# Patient Record
Sex: Male | Born: 1935 | Race: White | Hispanic: No | Marital: Married | Smoking: Former smoker
Health system: Southern US, Community
[De-identification: ages and names within clinical notes are randomized; demographics above are authoritative.]

## PROBLEM LIST (undated history)

## (undated) DIAGNOSIS — M1990 Unspecified osteoarthritis, unspecified site: Secondary | ICD-10-CM

## (undated) DIAGNOSIS — I10 Essential (primary) hypertension: Secondary | ICD-10-CM

## (undated) DIAGNOSIS — I499 Cardiac arrhythmia, unspecified: Secondary | ICD-10-CM

## (undated) DIAGNOSIS — R911 Solitary pulmonary nodule: Secondary | ICD-10-CM

## (undated) DIAGNOSIS — J189 Pneumonia, unspecified organism: Secondary | ICD-10-CM

## (undated) DIAGNOSIS — K4090 Unilateral inguinal hernia, without obstruction or gangrene, not specified as recurrent: Secondary | ICD-10-CM

## (undated) DIAGNOSIS — D649 Anemia, unspecified: Secondary | ICD-10-CM

## (undated) DIAGNOSIS — R0600 Dyspnea, unspecified: Secondary | ICD-10-CM

## (undated) DIAGNOSIS — J449 Chronic obstructive pulmonary disease, unspecified: Secondary | ICD-10-CM

## (undated) DIAGNOSIS — J439 Emphysema, unspecified: Secondary | ICD-10-CM

## (undated) DIAGNOSIS — G709 Myoneural disorder, unspecified: Secondary | ICD-10-CM

## (undated) DIAGNOSIS — G629 Polyneuropathy, unspecified: Secondary | ICD-10-CM

## (undated) DIAGNOSIS — J309 Allergic rhinitis, unspecified: Secondary | ICD-10-CM

## (undated) HISTORY — DX: Chronic obstructive pulmonary disease, unspecified: J44.9

## (undated) HISTORY — PX: TONSILLECTOMY: SUR1361

## (undated) HISTORY — PX: EYE SURGERY: SHX253

## (undated) HISTORY — DX: Solitary pulmonary nodule: R91.1

## (undated) HISTORY — PX: APPENDECTOMY: SHX54

## (undated) HISTORY — DX: Allergic rhinitis, unspecified: J30.9

## (undated) HISTORY — PX: VASECTOMY: SHX75

## (undated) HISTORY — PX: ROTATOR CUFF REPAIR: SHX139

## (undated) HISTORY — DX: Anemia, unspecified: D64.9

## (undated) HISTORY — PX: CATARACT EXTRACTION, BILATERAL: SHX1313

## (undated) HISTORY — DX: Emphysema, unspecified: J43.9

---
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
## 1999-08-05 ENCOUNTER — Encounter: Admission: RE | Admit: 1999-08-05 | Discharge: 1999-08-05 | Payer: Self-pay | Admitting: Orthopedic Surgery

## 1999-08-05 ENCOUNTER — Encounter: Payer: Self-pay | Admitting: Orthopedic Surgery

## 1999-08-05 ENCOUNTER — Ambulatory Visit (HOSPITAL_BASED_OUTPATIENT_CLINIC_OR_DEPARTMENT_OTHER): Admission: RE | Admit: 1999-08-05 | Discharge: 1999-08-05 | Payer: Self-pay | Admitting: Orthopedic Surgery

## 2000-02-26 ENCOUNTER — Encounter: Payer: Self-pay | Admitting: Internal Medicine

## 2000-02-26 ENCOUNTER — Ambulatory Visit (HOSPITAL_COMMUNITY): Admission: RE | Admit: 2000-02-26 | Discharge: 2000-02-26 | Payer: Self-pay | Admitting: *Deleted

## 2000-06-03 ENCOUNTER — Encounter: Payer: Self-pay | Admitting: Internal Medicine

## 2000-06-03 ENCOUNTER — Ambulatory Visit (HOSPITAL_COMMUNITY): Admission: RE | Admit: 2000-06-03 | Discharge: 2000-06-03 | Payer: Self-pay | Admitting: Internal Medicine

## 2000-08-16 HISTORY — PX: ROTATOR CUFF REPAIR: SHX139

## 2000-10-17 ENCOUNTER — Other Ambulatory Visit: Admission: RE | Admit: 2000-10-17 | Discharge: 2000-10-17 | Payer: Self-pay | Admitting: Urology

## 2000-10-17 ENCOUNTER — Encounter (INDEPENDENT_AMBULATORY_CARE_PROVIDER_SITE_OTHER): Payer: Self-pay

## 2001-08-16 HISTORY — PX: CARDIAC CATHETERIZATION: SHX172

## 2002-02-20 ENCOUNTER — Encounter: Payer: Self-pay | Admitting: Cardiology

## 2002-02-20 ENCOUNTER — Encounter: Payer: Self-pay | Admitting: Emergency Medicine

## 2002-02-20 ENCOUNTER — Inpatient Hospital Stay (HOSPITAL_COMMUNITY): Admission: EM | Admit: 2002-02-20 | Discharge: 2002-02-21 | Payer: Self-pay | Admitting: Emergency Medicine

## 2002-05-08 ENCOUNTER — Ambulatory Visit (HOSPITAL_COMMUNITY): Admission: RE | Admit: 2002-05-08 | Discharge: 2002-05-08 | Payer: Self-pay | Admitting: Cardiology

## 2002-05-08 ENCOUNTER — Encounter: Payer: Self-pay | Admitting: Cardiology

## 2002-09-12 ENCOUNTER — Ambulatory Visit (HOSPITAL_COMMUNITY): Admission: RE | Admit: 2002-09-12 | Discharge: 2002-09-12 | Payer: Self-pay | Admitting: Gastroenterology

## 2002-09-12 ENCOUNTER — Encounter (INDEPENDENT_AMBULATORY_CARE_PROVIDER_SITE_OTHER): Payer: Self-pay | Admitting: Specialist

## 2002-12-20 ENCOUNTER — Encounter: Payer: Self-pay | Admitting: Internal Medicine

## 2002-12-20 ENCOUNTER — Ambulatory Visit (HOSPITAL_COMMUNITY): Admission: RE | Admit: 2002-12-20 | Discharge: 2002-12-20 | Payer: Self-pay | Admitting: Internal Medicine

## 2003-04-18 ENCOUNTER — Ambulatory Visit (HOSPITAL_COMMUNITY): Admission: RE | Admit: 2003-04-18 | Discharge: 2003-04-18 | Payer: Self-pay | Admitting: Internal Medicine

## 2004-08-19 ENCOUNTER — Ambulatory Visit: Payer: Self-pay | Admitting: Internal Medicine

## 2004-10-01 ENCOUNTER — Ambulatory Visit: Payer: Self-pay | Admitting: Internal Medicine

## 2004-10-08 ENCOUNTER — Ambulatory Visit: Payer: Self-pay | Admitting: Internal Medicine

## 2005-01-07 ENCOUNTER — Ambulatory Visit: Payer: Self-pay | Admitting: Internal Medicine

## 2005-07-01 ENCOUNTER — Ambulatory Visit: Payer: Self-pay | Admitting: Internal Medicine

## 2005-07-01 LAB — PULMONARY FUNCTION TEST

## 2005-09-27 ENCOUNTER — Ambulatory Visit: Payer: Self-pay | Admitting: Internal Medicine

## 2006-01-06 ENCOUNTER — Ambulatory Visit: Payer: Self-pay | Admitting: Internal Medicine

## 2006-01-07 ENCOUNTER — Ambulatory Visit: Payer: Self-pay | Admitting: Cardiology

## 2006-01-14 ENCOUNTER — Encounter (HOSPITAL_COMMUNITY): Admission: RE | Admit: 2006-01-14 | Discharge: 2006-04-14 | Payer: Self-pay | Admitting: Internal Medicine

## 2006-02-18 ENCOUNTER — Ambulatory Visit: Payer: Self-pay | Admitting: Internal Medicine

## 2006-04-19 ENCOUNTER — Encounter (HOSPITAL_COMMUNITY): Admission: RE | Admit: 2006-04-19 | Discharge: 2006-07-18 | Payer: Self-pay | Admitting: Internal Medicine

## 2006-07-19 ENCOUNTER — Encounter (HOSPITAL_COMMUNITY): Admission: RE | Admit: 2006-07-19 | Discharge: 2006-10-17 | Payer: Self-pay | Admitting: Internal Medicine

## 2006-08-16 HISTORY — PX: CATARACT EXTRACTION: SUR2

## 2006-08-18 ENCOUNTER — Ambulatory Visit: Payer: Self-pay | Admitting: Internal Medicine

## 2006-10-18 ENCOUNTER — Encounter (HOSPITAL_COMMUNITY): Admission: RE | Admit: 2006-10-18 | Discharge: 2006-11-15 | Payer: Self-pay | Admitting: Internal Medicine

## 2007-02-24 ENCOUNTER — Ambulatory Visit: Payer: Self-pay | Admitting: Internal Medicine

## 2007-07-21 ENCOUNTER — Telehealth: Payer: Self-pay | Admitting: Internal Medicine

## 2007-07-24 DIAGNOSIS — M7150 Other bursitis, not elsewhere classified, unspecified site: Secondary | ICD-10-CM | POA: Insufficient documentation

## 2007-07-24 DIAGNOSIS — J449 Chronic obstructive pulmonary disease, unspecified: Secondary | ICD-10-CM | POA: Insufficient documentation

## 2007-07-24 DIAGNOSIS — J309 Allergic rhinitis, unspecified: Secondary | ICD-10-CM | POA: Insufficient documentation

## 2007-07-25 ENCOUNTER — Encounter (HOSPITAL_COMMUNITY): Admission: RE | Admit: 2007-07-25 | Discharge: 2007-08-15 | Payer: Self-pay | Admitting: Internal Medicine

## 2007-08-17 ENCOUNTER — Encounter (HOSPITAL_COMMUNITY): Admission: RE | Admit: 2007-08-17 | Discharge: 2007-09-15 | Payer: Self-pay | Admitting: Internal Medicine

## 2007-08-23 ENCOUNTER — Ambulatory Visit: Payer: Self-pay | Admitting: Internal Medicine

## 2008-02-21 ENCOUNTER — Ambulatory Visit: Payer: Self-pay | Admitting: Internal Medicine

## 2008-02-29 ENCOUNTER — Encounter: Payer: Self-pay | Admitting: Internal Medicine

## 2008-03-08 ENCOUNTER — Telehealth: Payer: Self-pay | Admitting: Internal Medicine

## 2008-08-21 ENCOUNTER — Ambulatory Visit: Payer: Self-pay | Admitting: Internal Medicine

## 2008-08-28 ENCOUNTER — Encounter: Payer: Self-pay | Admitting: Internal Medicine

## 2008-09-10 ENCOUNTER — Telehealth: Payer: Self-pay | Admitting: Internal Medicine

## 2008-09-16 DIAGNOSIS — R911 Solitary pulmonary nodule: Secondary | ICD-10-CM | POA: Insufficient documentation

## 2008-12-12 ENCOUNTER — Ambulatory Visit: Payer: Self-pay | Admitting: Internal Medicine

## 2009-03-05 ENCOUNTER — Ambulatory Visit: Payer: Self-pay | Admitting: Internal Medicine

## 2009-03-06 ENCOUNTER — Telehealth: Payer: Self-pay | Admitting: Internal Medicine

## 2009-05-06 ENCOUNTER — Telehealth (INDEPENDENT_AMBULATORY_CARE_PROVIDER_SITE_OTHER): Payer: Self-pay | Admitting: *Deleted

## 2009-07-08 ENCOUNTER — Ambulatory Visit: Payer: Self-pay | Admitting: Internal Medicine

## 2009-09-02 ENCOUNTER — Encounter: Payer: Self-pay | Admitting: Internal Medicine

## 2009-10-13 ENCOUNTER — Telehealth: Payer: Self-pay | Admitting: Internal Medicine

## 2009-10-14 ENCOUNTER — Ambulatory Visit: Payer: Self-pay | Admitting: Internal Medicine

## 2009-10-15 ENCOUNTER — Telehealth: Payer: Self-pay | Admitting: Internal Medicine

## 2009-10-26 ENCOUNTER — Encounter: Payer: Self-pay | Admitting: Internal Medicine

## 2009-11-11 ENCOUNTER — Ambulatory Visit: Payer: Self-pay | Admitting: Internal Medicine

## 2009-11-15 ENCOUNTER — Encounter: Payer: Self-pay | Admitting: Internal Medicine

## 2009-11-26 ENCOUNTER — Telehealth (INDEPENDENT_AMBULATORY_CARE_PROVIDER_SITE_OTHER): Payer: Self-pay | Admitting: *Deleted

## 2010-05-20 ENCOUNTER — Ambulatory Visit: Payer: Self-pay | Admitting: Internal Medicine

## 2010-08-25 ENCOUNTER — Telehealth (INDEPENDENT_AMBULATORY_CARE_PROVIDER_SITE_OTHER): Payer: Self-pay | Admitting: *Deleted

## 2010-09-05 ENCOUNTER — Other Ambulatory Visit: Payer: Self-pay | Admitting: Family Medicine

## 2010-09-05 DIAGNOSIS — Z136 Encounter for screening for cardiovascular disorders: Secondary | ICD-10-CM

## 2010-09-15 NOTE — Progress Notes (Signed)
Summary: rx instructions  Phone Note Call from Patient Call back at [phone]   Caller: Patient Call For: young Reason for Call: Talk to Nurse, Talk to Doctor Summary of Call: breathing treatment did help.  on instruction sheet symbicort is different from prescription  please clarify. Initial call taken by: Eugene Gavia,  October 15, 2009 9:47 AM  Follow-up for Phone Call        Pt states he is using symbicort 2 puffs twice daily, but this is incorrect on med list. I have corrected med list. Pt also states that the breahting treatment helped his breathing and states that CY mentioned if it helped that he would give order for home neb treatments. Please advise if you want pt to begin home neb treatments. Thanks. Carron Curie CMA  October 15, 2009 11:00 AM allergies: singulair, tolectin  Additional Follow-up for Phone Call Additional follow up Details #1::        I have asked [facility] to set up home neb. Additional Follow-up by: Waymon Budge MD,  October 15, 2009 2:46 PM    Additional Follow-up for Phone Call Additional follow up Details #2::    Please provide me with a written rx for nebulizer meds. Rhonda Cobb  October 15, 2009 2:50 PM Order for home nebulizer and meds faxed to [facility]. Pt is aware. Rhonda Cobb  October 15, 2009 3:08 PM   New/Updated Medications: NEBULIZER COMPRESSOR  MISC (NEBULIZERS) As directed ALBUTEROL SULFATE (2.5 MG/3ML) 0.083% NEBU (ALBUTEROL SULFATE) 1 neb four times a day as needed Prescriptions: ALBUTEROL SULFATE (2.5 MG/3ML) 0.083% NEBU (ALBUTEROL SULFATE) 1 neb four times a day as needed  #50 x prn   Entered by:   Waymon Budge MD   Authorized by:   Pulmonary Triage   Signed by:   Waymon Budge MD on 10/15/2009   Method used:   Historical   RxID:   [id number] NEBULIZER COMPRESSOR  MISC (NEBULIZERS) As directed  #1 x 0   Entered by:   Waymon Budge MD   Authorized by:   Pulmonary Triage   Signed by:   Waymon Budge MD on 10/15/2009  Method used:   Historical   RxID:   [id number]   Appended Document: rx instructions-need to print rx's    Clinical Lists Changes  Medications: Rx of NEBULIZER COMPRESSOR  MISC (NEBULIZERS) As directed;  #1 x 0;  Signed;  Entered by: Reynaldo Minium CMA;  Authorized by: Waymon Budge MD;  Method used: Print then Give to Patient Rx of ALBUTEROL SULFATE (2.5 MG/3ML) 0.083% NEBU (ALBUTEROL SULFATE) 1 neb four times a day as needed;  #50 x prn;  Signed;  Entered by: Reynaldo Minium CMA;  Authorized by: Waymon Budge MD;  Method used: Print then Give to Patient    Prescriptions: ALBUTEROL SULFATE (2.5 MG/3ML) 0.083% NEBU (ALBUTEROL SULFATE) 1 neb four times a day as needed  #50 x prn   Entered by:   Reynaldo Minium CMA   Authorized by:   Waymon Budge MD   Signed by:   Reynaldo Minium CMA on 10/15/2009   Method used:   Print then Give to Patient   RxID:   [id number] NEBULIZER COMPRESSOR  MISC (NEBULIZERS) As directed  #1 x 0   Entered by:   Reynaldo Minium CMA   Authorized by:   Waymon Budge MD   Signed by:   Reynaldo Minium CMA on 10/15/2009  Method used:   Print then Give to Patient   RxID:   [id number]

## 2010-09-15 NOTE — Progress Notes (Signed)
Summary: prescription  Phone Note Call from Patient Call back at [phone]   Caller: Patient Call For: young Summary of Call: pt calling to see why prednisone prescript was denied Initial call taken by: Rickard Patience,  May 06, 2009 2:07 PM  Follow-up for Phone Call        PER CY OK TO GIVE PREDNISONE 10MG  #20--1 four times a day x 2, --1 three times a day x2--1 twice a day x2--1 once daily x 2  pt aware rx sent to pharmacy Follow-up by: Philipp Deputy CMA,  May 06, 2009 2:40 PM    New/Updated Medications: PREDNISONE 10 MG TABS (PREDNISONE) 1 four times a day x 2 days, 1 three times a day x 2 days, 1 twice a day x 2 days, 1 once daily x 2 days Prescriptions: PREDNISONE 10 MG TABS (PREDNISONE) 1 four times a day x 2 days, 1 three times a day x 2 days, 1 twice a day x 2 days, 1 once daily x 2 days  #20 x 0   Entered by:   Philipp Deputy CMA   Authorized by:   Waymon Budge MD   Signed by:   Philipp Deputy CMA on 05/06/2009   Method used:   Electronically to        [facility]. [address] [phone]* (retail)       [address]       [place], [place]  [address]       Ph: [phone] or [phone]       Fax: [phone]   RxID:   [id number]

## 2010-09-15 NOTE — Assessment & Plan Note (Signed)
Summary: 6 months/apc   Visit Type:  Follow-up Primary Provider/Referring Provider:  Wyline Beady  CC:  6 month f/u; wants CXR.  History of Present Illness:  30/60- 76 year old man returning for follow-up with severe COPD complicating history of allergic rhinitis and coronary disease. previously questioned lung nodule.  A chest x-ray in January showed only stable COPD.  He notices easy exertional dyspnea, especially climbing stairs.  He leaves his oxygen at the foot of the stair and sets it on 4 L while he climbs or with any other significant activity.  He feels comfortable without oxygen walking level around the house.  For the past two weeks he has noted  medications hang at the level of his larynx.  He denies pain or choking when he swallows, chest pain, cough, or phlegm, reflux, or GI or GU upset.  08/21/08- COPD, Hx pulm nodule Feels much better- less tired and more energy- since began oxygen for sleep- asks to check. Little cough/ phlegm. Occasionally small streak of blood, not progressive. denies chest pain. Finished pulmonary rehab. Comfortable ambulation for errands etc on room air, unless he is carrying something. Got both flu vax.Rare need for rescue inhaler.   12/12/08- COPD, Hx pulm nodule 3 weeks increased cough, sneeze, rhinorhea, reduced energy. Denies significant sore throat or fever or purulent discharge. Cough is more productive. Yesterday he started a prednisone taper with question about directions which we resolved.  03/05/09- COPD, Hx pulm nodule Back to his baseline after prednisone for the exacerbation last visit. He is careful about the hot weather.  he does note his cough is more poroductive of thick phlegm- clear or trace yellow. Not wheezing, but frequently short of breath- stairs especially and uses his oxygen for starirs.     Allergies: 1)  ! * Singulair 2)  ! Tolectin  Past History:  Past Medical History: Last updated: 08/26/2007 C O P D Allergic  Rhinitis cataract surgery R rotator cuff tonsilectomy  appendectomy  Past Surgical History: Last updated: 08/21/2008 Tonsils Appendectomy Cataracts Right rotator cuff vasectomy  Family History: Last updated: 08-26-07 Father died CVA Mother- old age, COPD Sister died  COPD- smoked Coronary Heart Disease  Social History: Last updated: 08/26/2007 Patient states former smoker- quit 1992  Risk Factors: Smoking Status: quit (26-Aug-2007)  Review of Systems      See HPI       The patient complains of hoarseness and prolonged cough.  The patient denies anorexia, fever, weight loss, weight gain, vision loss, decreased hearing, chest pain, syncope, dyspnea on exertion, peripheral edema, headaches, hemoptysis, abdominal pain, melena, hematochezia, severe indigestion/heartburn, hematuria, incontinence, genital sores, muscle weakness, suspicious skin lesions, transient blindness, difficulty walking, depression, unusual weight change, abnormal bleeding, enlarged lymph nodes, angioedema, breast masses, and testicular masses.    Vital Signs:  Patient profile:   76 year old male Height:      70 inches Weight:      203.13 pounds BMI:     29.25 O2 Sat:      90 % on Room air Pulse rate:   72 / minute BP sitting:   136 / 74  (left arm) Cuff size:   regular  Vitals Entered By: Marinus Maw (March 05, 2009 9:55 AM)  O2 Flow:  Room air  O2 Sat Comments O2 sat at 90% on RA - rechecked after rest and sat of 95% Marinus Maw  March 05, 2009 10:01 AM  CC: 6 month f/u; wants CXR Comments Medications reviewed with patient  Marinus Maw  March 05, 2009 9:57 AM    Physical Exam  Additional Exam:  General: A/Ox3; pleasant and cooperative, NAD, SKIN: no rash, lesions NODES: no lymphadenopathy HEENT: [place]/AT, EOM- WNL, Conjuctivae- clear, PERRLA, TM-WNL, Nose- clear, Throat- clear and wnl, Melampatti II NECK: Supple w/ fair ROM, JVD- none, normal carotid impulses w/o  bruits Thyroid-  CHEST: Dry faint crackles left base- noted again  withut cough, unlabored, no dullness. HEART: RRR, no m/g/r heard ABDOMEN- Soft, nontender ZOX:WRUE, nl pulses, no edema  NEURO: Grossly intact to observation      Impression & Recommendations:  Problem # 1:  C O P D (ICD-496)  Increased mucus viscosity without infection- mostly from drying. He complains of dry mouth over night. We discussed Biotine.Spiriva is drying. he will discuss protable concentrator for travel oxygen- with [facility]. Orders: T-2 View CXR (71020TC) Est. Patient Level III (45409)  Problem # 2:  PULMONARY NODULE (ICD-518.89)  Will repeat CXR  Medications Added to Medication List This Visit: 1)  Aspirin 81 Mg Tabs (Aspirin) .... Take one tablet daiy.  Patient Instructions: 1)  Please schedule a follow-up appointment in 4 months. 2)  A chest x-ray has been recommended.  Your imaging study may require preauthorization.  3)  Talk with [facility] for suggestions about contacting other home care companies to see what travel oxygen options they have. 4)  BP today- 136/74, room air saturation 95% at rest

## 2010-09-15 NOTE — Progress Notes (Signed)
Summary: oximetry results add to also use for sleep  Phone Note Call from Patient   Caller: Patient Summary of Call: phone [phone] pt called wanting to know results of oximetry. This was done by [facility] and report sent to Dr. Maple Hudson on 03/04/08. Please notify pt of results. Initial call taken by: Alfonso Ramus,  March 08, 2008 10:39 AM  Follow-up for Phone Call        Awaiting ONO to be refaxed from [facility]. We will place results with this message for CY to review.  Pt aware. Michel Bickers [facility]  March 08, 2008 10:54 AM Follow-up by: Waymon Budge MD,  March 08, 2008 11:44 AM    New/Updated Medications: * OXYGEN 2 Liters/min with exercise, sleep

## 2010-09-15 NOTE — Progress Notes (Signed)
Summary: speak to nurse  Phone Note Call from Patient Call back at Home Phone [phone]   Caller: Patient Call For: young Reason for Call: Talk to Nurse Summary of Call: want to speak to nurse re: Symbicort she called in to [facility]. Initial call taken by: Eugene Gavia,  November 26, 2009 3:43 PM  Follow-up for Phone Call        called spoke with patient. he asked me to verify the quantity and directions from last OV, which were #3 and 1 puff two times a day.  per 06/2009 ov note, pt is to take this 2 puffs two times a day.  med changed on med list and sent into mail-order pharmacy. Follow-up by: Boone Master CNA,  November 26, 2009 4:31 PM    New/Updated Medications: SYMBICORT 160-4.5 MCG/ACT AERO (BUDESONIDE-FORMOTEROL FUMARATE) 2 puffs two times a day and RINSE mouth well Prescriptions: SYMBICORT 160-4.5 MCG/ACT AERO (BUDESONIDE-FORMOTEROL FUMARATE) 2 puffs two times a day and RINSE mouth well  #4 x 3   Entered by:   Boone Master CNA   Authorized by:   Waymon Budge MD   Signed by:   Boone Master CNA on 11/26/2009   Method used:   Electronically to        [facility]* (mail-order)       [address], [place]  [address]       Ph: [phone]       Fax: [phone]   RxID:   [id number]

## 2010-09-15 NOTE — Progress Notes (Signed)
Summary: sob - want appt  Phone Note Call from Patient Call back at [phone]   Caller: Patient Call For: young Reason for Call: Talk to Nurse Summary of Call: want an appt, having trouble breathing. Initial call taken by: Eugene Gavia,  October 13, 2009 4:08 PM  Follow-up for Phone Call        pt states CY has changed around some of his meds and he is still having SOB.  Pt request appt. Per KW ok to add to 11:15 slot tomorrow. Pt aware.Carron Curie CMA  October 13, 2009 4:22 PM

## 2010-09-15 NOTE — Assessment & Plan Note (Signed)
Summary: rov 6 months////kp   PCP:  Wyline Beady  Chief Complaint:  6 month follow up visit .  History of Present Illness: Current Problems:  CORONARY HEART DISEASE (ICD-V17.3) Hx of PULMONARY NODULE (ICD-518.89) ALLERGIC RHINITIS (ICD-477.9) BURSITIS (ICD-727.3) C O P D (ICD-13)  52/100- 76 year old man returning for follow-up with severe COPD complicating history of allergic rhinitis and coronary disease. previously questioned lung nodule.  A chest x-ray in January showed only stable COPD.  He notices easy exertional dyspnea, especially climbing stairs.  He leaves his oxygen at the foot of the stair and sets it on 4 L while he climbs or with any other significant activity.  He feels comfortable without oxygen walking level around the house.  For the past two weeks he has noted  medications hang at the level of his larynx.  He denies pain or choking when he swallows, chest pain, cough, or phlegm, reflux, or GI or GU upset.  08/21/08- COPD, Hx pulm nodule Feels much better- less tired and more energy- since began oxygen for sleep- asks to check. Little cough/ phlegm. Occasionally small streak of blood, not progressive. denies chest pain. Finished pulmonary rehab. Comfortable ambulation for errands etc on room air, unless he is carrying something. Got both flu vax.Rare need for rescue inhaler.        Prior Medications Reviewed Using: Patient Recall  Updated Prior Medication List: SPIRIVA HANDIHALER 18 MCG  CAPS (TIOTROPIUM BROMIDE MONOHYDRATE) Inhale contents of 1 capsule once a day ADVAIR DISKUS 250-50 MCG/DOSE  MISC (FLUTICASONE-SALMETEROL) inhale one puff two times a day LANOXIN 0.25 MG  TABS (DIGOXIN) take one tab by mouth once daily NORVASC 5 MG  TABS (AMLODIPINE BESYLATE) take one tab by mouth once daily ZOCOR 40 MG  TABS (SIMVASTATIN) take one tab by mouth at bedtime DIOVAN 80 MG  TABS (VALSARTAN) take one tab by mouth once daily * OXYGEN 2 Liters/min with exercise,  sleep ALBUTEROL 90 MCG/ACT  AERS (ALBUTEROL) use as needed FINASTERIDE 5 MG TABS (FINASTERIDE) take 1 every other day  Current Allergies (reviewed today): ! * SINGULAIR ! TOLECTIN  Past Medical History:    Reviewed history from 08/23/2007 and no changes required:       C O P D       Allergic Rhinitis       cataract surgery       R rotator cuff       tonsilectomy        appendectomy  Past Surgical History:    Reviewed history and no changes required:       Tonsils       Appendectomy       Cataracts       Right rotator cuff       vasectomy   Social History:    Reviewed history from 08/23/2007 and no changes required:       Patient states former smoker- quit 1992    Review of Systems      See HPI       Denies headache, sinus drainage, sneezing chest pain, n/v/d, weight loss, fever, edema.     Vital Signs:  Patient Profile:   76 Years Old Male Weight:      204 pounds O2 Sat:      96 % O2 treatment:    Room Air Pulse rate:   78 / minute BP sitting:   110 / 64  (left arm) Cuff size:   regular  Vitals Entered By: Florentina Addison  Welchel CMA (August 21, 2008 10:32 AM)             Comments Medications reviewed with patient Reynaldo Minium CMA  August 21, 2008 10:33 AM      Physical Exam  General: A/Ox3; pleasant and cooperative, NAD, SKIN: no rash, lesions NODES: no lymphadenopathy HEENT: [place]/AT, EOM- WNL, Conjuctivae- clear, PERRLA, TM-WNL, Nose- clear, Throat- clear and wnl NECK: Supple w/ fair ROM, JVD- none, normal carotid impulses w/o bruits Thyroid- normal to palpation CHEST: Dry faint crackles left base, unlabored, no dullness. HEART: RRR, no m/g/r heard ABDOMEN: Soft and nl; nml bowel sounds; no organomegaly or masses noted OHY:WVPX, nl pulses, no edema  NEURO: Grossly intact to observation      CXR  Procedure date:  08/21/2008  Findings:       CHEST - 2 VIEW   Comparison: 08/23/2007.   Findings: There is a questionable medially located left  lower lobe pulmonary nodule.  This would be more accurately evaluated by CT scan.  The 10 mm size nodule previously seen within the left lower lobe on a CT chest dated 01/07/2006 would be better followed by follow-up chest CT scan (if felt to be indicated).  There are changes of COPD present which are stable.  There are no infiltrates.  The heart is normal in size and there is no evidence for mediastinal or hilar adenopathy.  The central pulmonary arteries are prominent but unchanged.   IMPRESSION:   1.  Possible left lower lobe nodule.  This would be better evaluated by chest CT scan.  (Please see above discussion). 2. Changes of COPD - stable.   Read By:  Stoney Bang,  M.D.     Released By:  Stoney Bang,  M.D.      Problem # 1:  C O P D (ICD-496) Stable. Not sure if oxygen lets him sleep more soundly, relieves some pulmonary hypertension, or both, but as long as it makes him feel better to sleep with it we can assume it is helpful. Not sure significance of occasional, nonprogressive spot of heme in clear mucus Plan:CXR.ONOXimetry His updated medication list for this problem includes:    Spiriva Handihaler 18 Mcg Caps (Tiotropium bromide monohydrate) ..... Inhale contents of 1 capsule once a day    Advair Diskus 250-50 Mcg/dose Misc (Fluticasone-salmeterol) ..... Inhale one puff two times a day    Albuterol 90 Mcg/act Aers (Albuterol) ..... Use as needed   Problem # 2:  Hx of PULMONARY NODULE (ICD-518.89) Current cxr again shows an indistinct left lower lobe nodule. This is probably the same nodule seen on CT in 2007. Will discuss with patient whether to repeat CT.  Medications Added to Medication List This Visit: 1)  Finasteride 5 Mg Tabs (Finasteride) .... Take 1 every other day   Patient Instructions: 1)  Please schedule a follow-up appointment in 6 months. 2)  Samples Advair 250/50, Spiriva. 3)  A chest x-ray has been recommended.  Your imaging study  may require preauthorization.  4)  See [facility] about overnight oximetry 5)  Copy note to Catha Gosselin   ]

## 2010-09-15 NOTE — Assessment & Plan Note (Signed)
Summary: increased SOB//jrc   Primary Provider/Referring Provider:  Wyline Beady  CC:  Accute Visit-Increased SOB still; was sick approx 6 weeks ago. [name]  History of Present Illness: 03/05/09- COPD, Hx pulm nodule Back to his baseline after prednisone for the exacerbation last visit. He is careful about the hot weather.  he does note his cough is more poroductive of thick phlegm- clear or trace yellow. Not wheezing, but frequently short of breath- stairs especially and uses his oxygen for starirs.  July 08, 2009- COPD, Hx pulm nodule He thinks over past year slowly more exertional dyspnea and more apt to need oxygen when he is active. He asks about more use of his rescue inhaler. We discussed his Advair and alternatives. Had poor experience with theophylline. Watery rhinorhea for an hour on waking- probably from nasal oxygen drying. Uses Oxygen 2.5 at night, 4 l for steps. We reviewed his last cxr again in  detail and discussed criteria for CT. Had Flu shot   October 14, 2009- COPD, Hx pulm nodule. Just after last here, he started the Symbicort for comparison. Then got a respiratory infection treated with antibiotic and prednisone. He has never felt that his breathing got back to baseline. Easy dyspnea walking across room Sputum might be a little yellow. Denies chest pain, wheeze, ankle edema, palpitations. Symbicort not clearly better than Advair. Failed previous theophyline trial. Oxygen does help. Room air O2 sat today is 90%.  Current Medications (verified): 1)  Spiriva Handihaler 18 Mcg  Caps (Tiotropium Bromide Monohydrate) .... Inhale Contents of 1 Capsule Once A Day 2)  Proventil Hfa 108 (90 Base) Mcg/act Aers (Albuterol Sulfate) .... 2 Puffs Four Times A Day As Needed 3)  Oxygen 2.-3 L/m For Sleep, Exercise 4)  Lanoxin 0.25 Mg  Tabs (Digoxin) .... Take One Tab By Mouth Once Daily 5)  Norvasc 5 Mg  Tabs (Amlodipine Besylate) .... Take One Tab By Mouth Once Daily 6)  Zocor 40 Mg  Tabs  (Simvastatin) .... Take One Tab By Mouth At Bedtime 7)  Diovan 80 Mg  Tabs (Valsartan) .... Take One Tab By Mouth Once Daily 8)  Finasteride 5 Mg Tabs (Finasteride) .... Take 1 Every Other Day 9)  Aspirin 81 Mg Tabs (Aspirin) .... Take One Tablet Daiy. 10)  Multivitamins  Tabs (Multiple Vitamin) .... Take 1 By Mouth Once Daily 11)  Symbicort 160-4.5 Mcg/act Aero (Budesonide-Formoterol Fumarate) .[name].. 1 Puff Two Times A Day and Rinse Mouth Well  Allergies (verified): 1)  ! * Singulair 2)  ! Tolectin  Past History:  Past Medical History: Last updated: September 21, 2007 C O P D Allergic Rhinitis cataract surgery R rotator cuff tonsilectomy  appendectomy  Past Surgical History: Last updated: 08/21/2008 Tonsils Appendectomy Cataracts Right rotator cuff vasectomy  Family History: Last updated: 09-21-2007 Father died CVA Mother- old age, COPD Sister died  COPD- smoked Coronary Heart Disease  Social History: Last updated: September 21, 2007 Patient states former smoker- quit 1992  Risk Factors: Smoking Status: quit (2007/09/21)  Review of Systems      See HPI       The patient complains of dyspnea on exertion.  The patient denies anorexia, fever, weight loss, weight gain, vision loss, decreased hearing, hoarseness, chest pain, syncope, peripheral edema, prolonged cough, headaches, hemoptysis, abdominal pain, and severe indigestion/heartburn.    Vital Signs:  Patient profile:   76 year old male Height:      70 inches Weight:      202.13 pounds BMI:  29.11 O2 Sat:      90 % on Room air Pulse rate:   92 / minute BP sitting:   132 / 80  (left arm) Cuff size:   regular  Vitals Entered By: Reynaldo Minium CMA (October 14, 2009 11:32 AM)  O2 Flow:  Room air  Physical Exam  Additional Exam:  General: A/Ox3; pleasant and cooperative, NAD, SKIN: no rash, lesions NODES: no lymphadenopathy HEENT: [place]/AT, EOM- WNL, Conjuctivae- clear, PERRLA, TM-WNL, Nose- clear, Throat- clear and wnl,  Melampatti II NECK: Supple w/ fair ROM, JVD- none, normal carotid impulses w/o bruits Thyroid-  CHEST: Dry faint crackles left base- noted again  without cough, unlabored, no dullness.Very distant, slow expiratory phase HEART: RRR, no m/g/r heard ABDOMEN- Soft, nontender IEP:PIRJ, nl pulses, no edema  NEURO: Grossly intact to observation       Impression & Recommendations:  Problem # 1:  PULMONARY NODULE (ICD-518.89) Not reported on recent CXRs  Problem # 2:  C O P D (ICD-496) We discussed ways to change his bronchodilator status, recognizing he has mostly emphysema. We will give a neb treatment today and low dose theophylline. Consider home nebulizer. Use oxygen more of the time. I can't tell that his cardiac status has changed.  Medications Added to Medication List This Visit: 1)  Symbicort 160-4.5 Mcg/act Aero (Budesonide-formoterol fumarate) .[name].. 1 puff two times a day and rinse mouth well 2)  Theophylline Cr 100 Mg Xr12h-tab (Theophylline) .[name].. 1 twice daily after meals  Other Orders: Est. Patient Level III (18841) Xopenex 1.25mg  (Y6063)  Patient Instructions: 1)  Please schedule a follow-up appointment in 1 month. 2)  neb xop 1.25 3)  try theophylline for a couple of weeks.  4)  If either of these seems to make any difference we can extend it. Prescriptions: THEOPHYLLINE CR 100 MG XR12H-TAB (THEOPHYLLINE) 1 twice daily after meals  #30 x 0   Entered and Authorized by:   Waymon Budge MD   Signed by:   Waymon Budge MD on 10/14/2009   Method used:   Print then Give to Patient   RxID:   [id number]      Medication Administration  Medication # 1:    Medication: Xopenex 1.25mg     Diagnosis: C O P D (ICD-496)    Dose: 1.25mg     Route: inhaled    Exp Date: 09-11    Lot #: [id number]    Mfr: SEPRACOR    Patient tolerated medication without complications    Given by: Elray Buba RN (October 14, 2009 12:16 PM)  Orders Added: 1)  Est. Patient Level III  [62376] 2)  Xopenex 1.25mg  [E8315]

## 2010-09-15 NOTE — Assessment & Plan Note (Signed)
Summary: 1 month/apc   Primary Provider/Referring Provider:  Wyline Beady  CC:  1 month follow up visit.  History of Present Illness: July 08, 2009- COPD, Hx pulm nodule He thinks over past year slowly more exertional dyspnea and more apt to need oxygen when he is active. He asks about more use of his rescue inhaler. We discussed his Advair and alternatives. Had poor experience with theophylline. Watery rhinorhea for an hour on waking- probably from nasal oxygen drying. Uses Oxygen 2.5 at night, 4 l for steps. We reviewed his last cxr again in  detail and discussed criteria for CT. Had Flu shot   October 14, 2009- COPD, Hx pulm nodule. Just after last here, he started the Symbicort for comparison. Then got a respiratory infection treated with antibiotic and prednisone. He has never felt that his breathing got back to baseline. Easy dyspnea walking across room Sputum might be a little yellow. Denies chest pain, wheeze, ankle edema, palpitations. Symbicort not clearly better than Advair. Failed previous theophyline trial. Oxygen does help. Room air O2 sat today is 90%.  November 11, 2009- COPD, hx pulm nodule He couldn't tell benefit after theophylline after 5 days trial. Albuterol by nebulizer caused rapid heartbeat, light headed with no benefit to his breathing. He did better with the rescue albuterol inhaler. We talked about the purpose and side effects of available bronchodilators.  Current Medications (verified): 1)  Spiriva Handihaler 18 Mcg  Caps (Tiotropium Bromide Monohydrate) .... Inhale Contents of 1 Capsule Once A Day 2)  Proventil Hfa 108 (90 Base) Mcg/act Aers (Albuterol Sulfate) .... 2 Puffs Four Times A Day As Needed 3)  Oxygen 2.-3 L/m For Sleep, Exercise 4)  Lanoxin 0.25 Mg  Tabs (Digoxin) .... Take One Tab By Mouth Once Daily 5)  Norvasc 5 Mg  Tabs (Amlodipine Besylate) .... Take One Tab By Mouth Once Daily 6)  Zocor 40 Mg  Tabs (Simvastatin) .... Take One Tab By Mouth At  Bedtime 7)  Diovan 80 Mg  Tabs (Valsartan) .... Take One Tab By Mouth Once Daily 8)  Finasteride 5 Mg Tabs (Finasteride) .... Take 1 Every Other Day 9)  Aspirin 81 Mg Tabs (Aspirin) .... Take One Tablet Daiy. 10)  Multivitamins  Tabs (Multiple Vitamin) .... Take 1 By Mouth Once Daily 11)  Symbicort 160-4.5 Mcg/act Aero (Budesonide-Formoterol Fumarate) .[name].. 1 Puff Two Times A Day and Rinse Mouth Well 12)  Theophylline Cr 100 Mg Xr12h-Tab (Theophylline) .[name].. 1 Twice Daily After Meals 13)  Nebulizer Compressor  Misc (Nebulizers) .... As Directed 14)  Albuterol Sulfate (2.5 Mg/71ml) 0.083% Nebu (Albuterol Sulfate) .[name].. 1 Neb Four Times A Day As Needed  Allergies (verified): 1)  ! * Singulair 2)  ! Tolectin  Past History:  Past Medical History: Last updated: 09/04/07 C O P D Allergic Rhinitis cataract surgery R rotator cuff tonsilectomy  appendectomy  Past Surgical History: Last updated: 08/21/2008 Tonsils Appendectomy Cataracts Right rotator cuff vasectomy  Family History: Last updated: 09-04-2007 Father died CVA Mother- old age, COPD Sister died  COPD- smoked Coronary Heart Disease  Social History: Last updated: 09/04/07 Patient states former smoker- quit 1992  Risk Factors: Smoking Status: quit (09/04/07)  Review of Systems      See HPI       The patient complains of dyspnea on exertion.  The patient denies anorexia, fever, weight loss, weight gain, vision loss, decreased hearing, hoarseness, chest pain, syncope, peripheral edema, prolonged cough, headaches, hemoptysis, abdominal pain, and severe indigestion/heartburn.  Vital Signs:  Patient profile:   76 year old male Height:      70 inches Weight:      200.25 pounds BMI:     28.84 O2 Sat:      93 % on Room air Pulse rate:   82 / minute BP sitting:   124 / 64  (left arm) Cuff size:   regular  Vitals Entered By: Reynaldo Minium CMA (November 11, 2009 2:15 PM)  O2 Flow:  Room air  Physical  Exam  Additional Exam:  General: A/Ox3; pleasant and cooperative, NAD, Room air sat 93% at rest SKIN: no rash, lesions NODES: no lymphadenopathy HEENT: [place]/AT, EOM- WNL, Conjuctivae- clear, PERRLA, TM-WNL, Nose- clear, Throat- clear and wnl, [place]ampatti II NECK: Supple w/ fair ROM, JVD- none, normal carotid impulses w/o bruits Thyroid-  CHEST:Clear to P&A, very distant, unlabored. HEART: RRR, no m/g/r heard ABDOMEN- Soft, nontender JXB:JYNW, nl pulses, no edema  NEURO: Grossly intact to observation       Impression & Recommendations:  Problem # 1:  C O P D (ICD-496) Overall stable with slow progression of diease manifest by gradual increase in DOE. We compared Symbicort to Advair. I emphasized need to walk for endurance. He will save albuterol nebulizer for use when really having a hard time. Consider Xopenex trial.  Problem # 2:  ALLERGIC RHINITIS (ICD-477.9) Not having much trouble so far with the early tree pollens.  Other Orders: Est. Patient Level II (29562)  Patient Instructions: 1)  Please schedule a follow-up appointment in 6 months. 2)  Let me know if you have problems earlier. Keep walking! 3)  If you wanted to experiment more with the theophylline, try increasing to 3 tabs daily for a week, then perhaps up to a max of 4/day (200 mg twice daily). Prescriptions: SYMBICORT 160-4.5 MCG/ACT AERO (BUDESONIDE-FORMOTEROL FUMARATE) 1 puff two times a day and RINSE mouth well  #3 x 3   Entered and Authorized by:   Waymon Budge MD   Signed by:   Waymon Budge MD on 11/11/2009   Method used:   Print then Give to Patient   RxID:   [id number] THEOPHYLLINE CR 100 MG XR12H-TAB (THEOPHYLLINE) 1 twice daily after meals  #30 x prn   Entered and Authorized by:   Waymon Budge MD   Signed by:   Waymon Budge MD on 11/11/2009   Method used:   Print then Give to Patient   RxID:   [id number]

## 2010-09-15 NOTE — Assessment & Plan Note (Signed)
Summary: rov 6 months ///kp   Primary Provider/Referring Provider:  Wyline Beady  CC:  6 month follow up visit-allergies and COPD; SOB with activity still and no allergy flare ups..  History of Present Illness:  October 14, 2009- COPD, Hx pulm nodule. Just after last here, he started the Symbicort for comparison. Then got a respiratory infection treated with antibiotic and prednisone. He has never felt that his breathing got back to baseline. Easy dyspnea walking across room Sputum might be a little yellow. Denies chest pain, wheeze, ankle edema, palpitations. Symbicort not clearly better than Advair. Failed previous theophyline trial. Oxygen does help. Room air O2 sat today is 90%.  November 11, 2009- COPD, hx pulm nodule He couldn't tell benefit after theophylline after 5 days trial. Albuterol by nebulizer caused rapid heartbeat, light headed with no benefit to his breathing. He did better with the rescue albuterol inhaler. We talked about the purpose and side effects of available bronchodilators.  May 20, 2010- COPD, Hx pulm nodule 6 month follow up visit-allergies and COPD; SOB with activity still and no allergy flare ups. He finds as weather cools his stamina is reduced a little. He is using oxygen more. He isn't sure that Symbicort has made a difference. We discussed the 3 comparable products and discussed cost, incidence of pneumonia. He asks if it is time to update CT for lung nodule We went back to review reports from the last several CXRs and his CT of 2007. He understands that this left lower lobe nodule has not grown enough to be visible consistently on CXR in that time, and is probably benign.    Preventive Screening-Counseling & Management  Alcohol-Tobacco     Smoking Status: quit     Year Quit: 1992     Pack years: 35 years 2 packs daily  Current Medications (verified): 1)  Spiriva Handihaler 18 Mcg  Caps (Tiotropium Bromide Monohydrate) .... Inhale Contents of 1 Capsule Once  A Day 2)  Proventil Hfa 108 (90 Base) Mcg/act Aers (Albuterol Sulfate) .... 2 Puffs Four Times A Day As Needed 3)  Oxygen 2.-3 L/m For Sleep, Exercise 4)  Lanoxin 0.25 Mg  Tabs (Digoxin) .... Take One Tab By Mouth Once Daily 5)  Norvasc 5 Mg  Tabs (Amlodipine Besylate) .... Take One Tab By Mouth Once Daily 6)  Zocor 40 Mg  Tabs (Simvastatin) .... Take One Tab By Mouth At Bedtime 7)  Diovan 80 Mg  Tabs (Valsartan) .... Take One Tab By Mouth Once Daily 8)  Finasteride 5 Mg Tabs (Finasteride) .... Take 1 Every Other Day 9)  Aspirin 81 Mg Tabs (Aspirin) .... Take One Tablet Daiy. 10)  Multivitamins  Tabs (Multiple Vitamin) .... Take 1 By Mouth Once Daily 11)  Symbicort 160-4.5 Mcg/act Aero (Budesonide-Formoterol Fumarate) .... 2 Puffs Two Times A Day and Rinse Mouth Well 12)  Nebulizer Compressor  Misc (Nebulizers) .... As Directed 13)  Albuterol Sulfate (2.5 Mg/65ml) 0.083% Nebu (Albuterol Sulfate) .[name].. 1 Neb Four Times A Day As Needed  Allergies (verified): 1)  ! * Singulair 2)  ! Tolectin  Past History:  Past Medical History: Last updated: September 16, 2007 C O P D Allergic Rhinitis cataract surgery R rotator cuff tonsilectomy  appendectomy  Past Surgical History: Last updated: 08/21/2008 Tonsils Appendectomy Cataracts Right rotator cuff vasectomy  Family History: Last updated: 09-16-07 Father died CVA Mother- old age, COPD Sister died  COPD- smoked Coronary Heart Disease  Social History: Last updated: 09-16-07 Patient states former  smoker- quit 1992  Risk Factors: Smoking Status: quit (05/20/2010)  Review of Systems      See HPI       The patient complains of shortness of breath with activity.  The patient denies shortness of breath at rest, productive cough, non-productive cough, coughing up blood, chest pain, irregular heartbeats, acid heartburn, indigestion, loss of appetite, weight change, abdominal pain, difficulty swallowing, sore throat, tooth/dental problems,  headaches, nasal congestion/difficulty breathing through nose, sneezing, itching, hand/feet swelling, rash, change in color of mucus, and fever.    Vital Signs:  Patient profile:   76 year old male Height:      70 inches Weight:      197.38 pounds BMI:     28.42 O2 Sat:      92 % on Room air Pulse rate:   87 / minute BP sitting:   120 / 70  (left arm) Cuff size:   regular  Vitals Entered By: Reynaldo Minium CMA (May 20, 2010 9:52 AM)  O2 Flow:  Room air CC: 6 month follow up visit-allergies and COPD; SOB with activity still and no allergy flare ups.   Physical Exam  Additional Exam:  General: A/Ox3; pleasant and cooperative, NAD, Room air sat 92% at rest SKIN: no rash, lesions NODES: no lymphadenopathy HEENT: [place]/AT, EOM- WNL, Conjuctivae- clear, PERRLA, TM-WNL, Nose- clear, Throat- clear and wnl, Mallampati II NECK: Supple w/ fair ROM, JVD- none, normal carotid impulses w/o bruits Thyroid-  CHEST:Clear to P&A, very distant, unlabored. Slight cough HEART: RRR, no m/g/r heard ABDOMEN- Soft, nontender ZOX:WRUE, nl pulses, no edema  NEURO: Grossly intact to observation       Impression & Recommendations:  Problem # 1:  PULMONARY NODULE (ICD-518.89)  Not clearly progressive. We will not do CT now, but will do CXR for long term. long interval f/u.   Problem # 2:  C O P D (ICD-496) We will go back to Advair, which is at least likely to be a bit cheaper. I expect slow gradual progression of disease and encourage use of the steroid inhaler which has been shwn to slow progression. Also he will continue some degree of regular aerobic exercise to maintain stamina.   Problem # 3:  CORONARY HEART DISEASE (ICD-V17.3) We have discussed potential cardiac status changes that may present as increased dyspnea on exertion, palpitations, angina or fluid retention. He will report if noted.   Medications Added to Medication List This Visit: 1)  Advair Diskus 250-50 Mcg/dose Aepb  (Fluticasone-salmeterol) .[name].. 1 puff and rinse, twice daily  Other Orders: Est. Patient Level IV (45409) T-2 View CXR (71020TC)  Patient Instructions: 1)  Please schedule a follow-up appointment in 1 year. 2)  Script to refill Advair 250 3)  cc Dr Catha Gosselin Prescriptions: ADVAIR DISKUS 250-50 MCG/DOSE AEPB (FLUTICASONE-SALMETEROL) 1 puff and rinse, twice daily  #3 x 3   Entered and Authorized by:   Waymon Budge MD   Signed by:   Waymon Budge MD on 05/20/2010   Method used:   Print then Give to Patient   RxID:   [id number]

## 2010-09-15 NOTE — Progress Notes (Signed)
Summary: medication  Phone Note Call from Patient Call back at [phone]   Caller: Patient Call For: young Summary of Call: need the name of over the counter med for dry mouth  Initial call taken by: Rickard Patience,  March 06, 2009 1:22 PM  Follow-up for Phone Call        Spoke with pt; gave the name of OTC med for dry mouth(biotene). Reynaldo Minium CMA  March 06, 2009 1:31 PM

## 2010-09-15 NOTE — Procedures (Signed)
Summary: Oximetry Report/[facility]  Oximetry Report/[facility]   Imported By: Esmeralda Links D'jimraou 03/13/2008 12:31:36  _____________________________________________________________________  External Attachment:    Type:   Image     Comment:   External Document

## 2010-09-15 NOTE — Progress Notes (Signed)
Summary: speak to nurse  Phone Note Call from Patient Call back at [phone]   Caller: Patient Call For: young Reason for Call: Talk to Nurse Summary of Call: need further info from nurse re: chest xray Initial call taken by: [name],  September 10, 2008 10:05 AM  Follow-up for Phone Call        [email or web address] [name]  September 10, 2008 10:08 AM   RETURNING NURSE CALL  Additional Follow-up for Phone Call Additional follow up Details #1::        Spoke with pt.  He questions whether or not all of his previous scans and cxrs has been compared to his most recent cxr.  He states that he is concerned about all the radiation he is getting from all the ct scans.  He would like to speak with CDY please call pt at [phone].   Additional Follow-up by: [name],  September 10, 2008 2:30 PM  New Problems: PULMONARY NODULE (ICD-518.89)   Additional Follow-up for Phone Call Additional follow up Details #2::    I called patient and discussed the nodule and radiology report. He realizes, while this is PROBABLY the same nodule present x years, I can not guarantee it. He accepts that this might not be the same nodule, but is comfortable waiting for f/u cxr at next scheduled visit. Follow-up by: [name] Budge MD,  September 16, 2008 11:41 AM  New Problems: PULMONARY NODULE (ICD-518.89)

## 2010-09-15 NOTE — Progress Notes (Signed)
Summary: ORDER FOR PULM REHAB  Phone Note Call from Patient   Caller: Patient Call For: YOUNG Summary of Call: NEEDS ORDER FOR PULMONARY REHAB FAXED TO: [phone]. [place]. ATTN: [name]. CHART READS TO YOUNG NURSE AREA 11/28. PT SAYS HE WENT TO [facility] YESTERDAY BUT HE NEEDS THIS ORDER FIRST.  PATIENT'S CHART HAS BEEN REQUESTED Initial call taken by: Tivis Ringer,  July 21, 2007 10:20 AM  Follow-up for Phone Call        CDY you have this order and chart OK-CDY Follow-up by: Reynaldo Minium,  July 21, 2007 4:38 PM

## 2010-09-15 NOTE — Assessment & Plan Note (Signed)
Summary: 6 months/apc   Visit Type:  Follow-up PCP:  Wyline Beady  Chief Complaint:  68month visit...[name] KitchenMarland KitchenMarland Kitchenreviewed meds.....  History of Present Illness: Current Problems:  CORONARY HEART DISEASE (ICD-V17.3) Hx of PULMONARY NODULE (ICD-518.89) ALLERGIC RHINITIS (ICD-477.9) BURSITIS (ICD-727.3) C O P D (ICD-100)  76 year old man returning for follow-up with severe COPD complicating history of allergic rhinitis and coronary disease. previously questioned lung nodule.  A chest x-ray in January showed only stable COPD.  He notices easy exertional dyspnea, especially climbing stairs.  He leaves his oxygen at the foot of the stair and sets it on 4 L while he climbs or with any other significant activity.  He feels comfortable without oxygen walking level around the house.  For the past two weeks he has noted  medications hang at the level of his larynx.  He denies pain or choking when he swallows, chest pain, cough, or phlegm, reflux, or GI or GU upset.       Current Allergies: ! * SINGULAIR ! TOLECTIN  Past Medical History:    Reviewed history from 08/23/2007 and no changes required:       C O P D       Allergic Rhinitis       cataract surgery       R rotator cuff       tonsilectomy        appendectomy     Review of Systems      See HPI   Vital Signs:  Patient Profile:   76 Years Old Male Weight:      200 pounds O2 Sat:      92 % O2 treatment:    Room Air Pulse rate:   88 / minute BP sitting:   100 / 66  (left arm) Cuff size:   regular  Vitals Entered By: Clarise Cruz Duncan Dull) (February 21, 2008 9:09 AM)                 Physical Exam  GENERAL:  A/Ox3; pleasant & cooperative.NAD HEENT:  [place]/AT, EOM-wnl, PERRLA, EACs-clear, TMs-wnl, NOSE-clear, THROAT-clear & wnl. NECK:  Supple w/ fair ROM; no JVD; normal carotid impulses w/o bruits; no thyromegaly or nodules palpated; no lymphadenopathy. CHEST:Drecreased but clear HEART:  RRR, no m/r/g  heard ABDOMEN:  Soft & nt;  nml bowel sounds; no organomegaly or masses detected. EXT: Warm bilat,  no calf pain, edema, clubbing, pulses intact Skin: no rash/lesion        Problem # 1:  C O P D (ICD-496) he is more aware of exertional dyspnea requiring oxygen.  We are going to do a full oximetry check to reassess oxygen needs.  He has done pulmonary rehabilitation and still tries to walk. rediscussed his sense of pill hangup at the larynx.  Voice quality has not changed.  If this persists, we will ask swallowing evaluation and ENT for visual examination as appropriate. His updated medication list for this problem includes:    Spiriva Handihaler 18 Mcg Caps (Tiotropium bromide monohydrate) ..... Inhale contents of 1 capsule once a day    Advair Diskus 250-50 Mcg/dose Misc (Fluticasone-salmeterol) ..... Inhale one puff two times a day    Albuterol 90 Mcg/act Aers (Albuterol) ..... Use as needed   Problem # 2:  ALLERGIC RHINITIS (ICD-477.9) he is not complaining of rhinitis symptoms currently.   Patient Instructions: 1)  Please schedule a follow-up appointment in 6 months. 2)  We will set up a check of your oxygen  saturation levels   ]

## 2010-09-15 NOTE — Procedures (Signed)
Summary: oximetry report/[facility]  oximetry report/[facility]   Imported By: Lester Murray 03/13/2008 08:42:25  _____________________________________________________________________  External Attachment:    Type:   Image     Comment:   External Document

## 2010-09-15 NOTE — Procedures (Signed)
Summary: Oximetry Report/[facility]  Oximetry Report/[facility]   Imported By: Esmeralda Links D'jimraou 09/05/2008 14:56:01  _____________________________________________________________________  External Attachment:    Type:   Image     Comment:   External Document  Appended Document: Oximetry Report/[facility] Note significant desaturation despite oxygen at 2 L/M during sleep. Suggest he increase night-time oxygen to 2.5 L/M.  Appended Document: Oximetry Report/[facility] Spoke with pt.  Advised of oximetry results.  Pt will incr night time oxygen to 2 1/2L.  EWJ

## 2010-09-15 NOTE — Assessment & Plan Note (Signed)
Summary: breathing problem/ mbw   Primary Provider/Referring Provider:  Wyline Beady  CC:  Accute Visit-Increased SOB, cough-productive-thick, clear to light yellow. Fatigued. Denies fever, chills, and N&V.[name]  History of Present Illness: Current Problems:  CORONARY HEART DISEASE (ICD-V17.3) PULMONARY NODULE (ICD-518.89) ALLERGIC RHINITIS (ICD-477.9) BURSITIS (ICD-727.3) C O P D (ICD-34)  33/53- 76 year old man returning for follow-up with severe COPD complicating history of allergic rhinitis and coronary disease. previously questioned lung nodule.  A chest x-ray in January showed only stable COPD.  He notices easy exertional dyspnea, especially climbing stairs.  He leaves his oxygen at the foot of the stair and sets it on 4 L while he climbs or with any other significant activity.  He feels comfortable without oxygen walking level around the house.  For the past two weeks he has noted  medications hang at the level of his larynx.  He denies pain or choking when he swallows, chest pain, cough, or phlegm, reflux, or GI or GU upset.  08/21/08- COPD, Hx pulm nodule Feels much better- less tired and more energy- since began oxygen for sleep- asks to check. Little cough/ phlegm. Occasionally small streak of blood, not progressive. denies chest pain. Finished pulmonary rehab. Comfortable ambulation for errands etc on room air, unless he is carrying something. Got both flu vax.Rare need for rescue inhaler.   12/12/08- COPD, Hx pulm nodule 3 weeks increased cough, sneeze, rhinorhea, reduced energy. Denies significant sore throat or fever or purulent discharge. Cough is more productive. yesterday he started a prednisone taper with question about directions which we resolved.          Current Medications (verified): 1)  Spiriva Handihaler 18 Mcg  Caps (Tiotropium Bromide Monohydrate) .... Inhale Contents of 1 Capsule Once A Day 2)  Advair Diskus 250-50 Mcg/dose  Misc (Fluticasone-Salmeterol) ....  Inhale One Puff Two Times A Day 3)  Albuterol 90 Mcg/act  Aers (Albuterol) .... Use As Needed 4)  Oxygen 2.-3 L/m For Sleep, Exercise 5)  Lanoxin 0.25 Mg  Tabs (Digoxin) .... Take One Tab By Mouth Once Daily 6)  Norvasc 5 Mg  Tabs (Amlodipine Besylate) .... Take One Tab By Mouth Once Daily 7)  Zocor 40 Mg  Tabs (Simvastatin) .... Take One Tab By Mouth At Bedtime 8)  Diovan 80 Mg  Tabs (Valsartan) .... Take One Tab By Mouth Once Daily 9)  Finasteride 5 Mg Tabs (Finasteride) .... Take 1 Every Other Day 10)  Prednisone 5 Mg Tabs (Prednisone) .... Take 4 X 2days, 3 X 2days, 2 X 2days, 1 X 2days  Allergies (verified): 1)  ! * Singulair 2)  ! Tolectin  Past History:  Family History:    Father died CVA    Mother- old age, COPD    Sister died  COPD- smoked    Coronary Heart Disease     (08/23/2007)  Social History:    Patient states former smoker- quit 1992     (08/23/2007)  Risk Factors:    Alcohol Use: N/A    >5 drinks/d w/in last 3 months: N/A    Caffeine Use: N/A    Diet: N/A    Exercise: N/A  Risk Factors:    Smoking Status: quit (08/23/2007)    Packs/Day: N/A    Cigars/wk: N/A    Pipe Use/wk: N/A    Cans of tobacco/wk: N/A    Passive Smoke Exposure: N/A  Past medical, surgical, family and social histories (including risk factors) reviewed for relevance to current acute and chronic  problems.  Past Medical History:    Reviewed history from 08/23/2007 and no changes required:    C O P D    Allergic Rhinitis    cataract surgery    R rotator cuff    tonsilectomy     appendectomy  Past Surgical History:    Reviewed history from 08/21/2008 and no changes required:    Tonsils    Appendectomy    Cataracts    Right rotator cuff    vasectomy  Family History:    Reviewed history from 08/23/2007 and no changes required:       Father died CVA       Mother- old age, COPD       Sister died  COPD- smoked       Coronary Heart Disease  Social History:    Reviewed  history from 08/23/2007 and no changes required:       Patient states former smoker- quit 1992  Review of Systems      See HPI       The patient complains of dyspnea on exertion and peripheral edema.  The patient denies anorexia, fever, weight loss, weight gain, vision loss, decreased hearing, hoarseness, chest pain, syncope, prolonged cough, hemoptysis, abdominal pain, and severe indigestion/heartburn.    Vital Signs:  Patient profile:   76 year old male Height:      70 inches Weight:      203 pounds BMI:     29.23 Pulse rate:   71 / minute BP sitting:   118 / 62  (left arm) Cuff size:   regular  Vitals Entered By: Reynaldo Minium CMA (December 12, 2008 4:15 PM)  O2 Sat on room air at rest %:  95 CC: Accute Visit-Increased SOB, cough-productive-thick, clear to light yellow. Fatigued. Denies fever, chills, N&V. Comments Medications reviewed with patient Reynaldo Minium CMA  December 12, 2008 4:15 PM    Physical Exam  Additional Exam:  General: A/Ox3; pleasant and cooperative, NAD, SKIN: no rash, lesions NODES: no lymphadenopathy HEENT: [place]/AT, EOM- WNL, Conjuctivae- clear, PERRLA, TM-WNL, Nose- clear, Throat- clear and wnl, Melampatti II NECK: Supple w/ fair ROM, JVD- none, normal carotid impulses w/o bruits Thyroid-  CHEST: Dry faint crackles left base- louder today  withut cough, unlabored, no dullness. HEART: RRR, no m/g/r heard ABDOME JWJ:XBJY, nl pulses, no edema  NEURO: Grossly intact to observation      Impression & Recommendations:  Problem # 1:  C O P D (ICD-496) Allergic vs viral exacer bation. He has started pred taper so will progress with that as discussed.  Problem # 2:  PULMONARY NODULE (ICD-518.89) We will  recheck cxr on return scheduled in July  Medications Added to Medication List This Visit: 1)  Prednisone 10 Mg Tabs (Prednisone) .[name].. 1 tab four times daily x 2 days, 3 times daily x 2 days, 2 times daily x 2 days, 1 time daily x 2 days  Other  Orders: Est. Patient Level II (78295)  Patient Instructions: 1)  Keep appt as scheduled 2)  Suggested prednisone taper: 3)  40 mg x 2 days 4)  30 mg x 2 days 5)  20 mg x 2 days 6)  10 mg x 2 days 7)  Replacement script to hold, using 10 mg tabs, was sent to your drug store Prescriptions: PREDNISONE 10 MG TABS (PREDNISONE) 1 tab four times daily x 2 days, 3 times daily x 2 days, 2 times daily x 2 days, 1 time  daily x 2 days  #20 x 0   Entered and Authorized by:   Waymon Budge MD   Signed by:   Waymon Budge MD on 12/12/2008   Method used:   Electronically to        [facility]. [address] [phone]* (retail)       [address]       [place], [place]  [address]       Ph: [phone] or [phone]       Fax: [phone]   RxID:   [id number]

## 2010-09-15 NOTE — Assessment & Plan Note (Signed)
Summary: 4 MONTHS/ MBW   Primary Provider/Referring Provider:  Wyline Beady  CC:  4 month follow up visit-"not as good with breathing"; Increased SOB and discuss rescue inhaler..  History of Present Illness: 08/21/08- COPD, Hx pulm nodule Feels much better- less tired and more energy- since began oxygen for sleep- asks to check. Little cough/ phlegm. Occasionally small streak of blood, not progressive. denies chest pain. Finished pulmonary rehab. Comfortable ambulation for errands etc on room air, unless he is carrying something. Got both flu vax.Rare need for rescue inhaler.   12/12/08- COPD, Hx pulm nodule 3 weeks increased cough, sneeze, rhinorhea, reduced energy. Denies significant sore throat or fever or purulent discharge. Cough is more productive. Yesterday he started a prednisone taper with question about directions which we resolved.  03/05/09- COPD, Hx pulm nodule Back to his baseline after prednisone for the exacerbation last visit. He is careful about the hot weather.  he does note his cough is more poroductive of thick phlegm- clear or trace yellow. Not wheezing, but frequently short of breath- stairs especially and uses his oxygen for starirs.  July 08, 2009- COPD, Hx pulm nodule He thinks over past year slowly more exertional dyspnea and more apt to need oxygen when he is active. He asks about more use of his rescue inhaler. We discussed his Advair and alternatives. Had poor experience with theophylline. Watery rhinorhea for an hour on waking- probably from nasal oxygen drying. Uses Oxygen 2.5 at night, 4 l for steps. We reviewed his last cxr again in  detail and discussed criteria for CT. Had Flu shot   Current Medications (verified): 1)  Spiriva Handihaler 18 Mcg  Caps (Tiotropium Bromide Monohydrate) .... Inhale Contents of 1 Capsule Once A Day 2)  Advair Diskus 250-50 Mcg/dose  Misc (Fluticasone-Salmeterol) .... Inhale One Puff Two Times A Day 3)  Proventil Hfa 108 (90  Base) Mcg/act Aers (Albuterol Sulfate) .... 2 Puffs Four Times A Day As Needed 4)  Oxygen 2.-3 L/m For Sleep, Exercise 5)  Lanoxin 0.25 Mg  Tabs (Digoxin) .... Take One Tab By Mouth Once Daily 6)  Norvasc 5 Mg  Tabs (Amlodipine Besylate) .... Take One Tab By Mouth Once Daily 7)  Zocor 40 Mg  Tabs (Simvastatin) .... Take One Tab By Mouth At Bedtime 8)  Diovan 80 Mg  Tabs (Valsartan) .... Take One Tab By Mouth Once Daily 9)  Finasteride 5 Mg Tabs (Finasteride) .... Take 1 Every Other Day 10)  Aspirin 81 Mg Tabs (Aspirin) .... Take One Tablet Daiy. 11)  Multivitamins  Tabs (Multiple Vitamin) .... Take 1 By Mouth Once Daily  Allergies (verified): 1)  ! * Singulair 2)  ! Tolectin  Past History:  Past Medical History: Last updated: 01-Sep-2007 C O P D Allergic Rhinitis cataract surgery R rotator cuff tonsilectomy  appendectomy  Past Surgical History: Last updated: 08/21/2008 Tonsils Appendectomy Cataracts Right rotator cuff vasectomy  Family History: Last updated: 09-01-2007 Father died CVA Mother- old age, COPD Sister died  COPD- smoked Coronary Heart Disease  Social History: Last updated: 09-01-07 Patient states former smoker- quit 1992  Risk Factors: Smoking Status: quit (09-01-2007)  Review of Systems      See HPI       The patient complains of dyspnea on exertion.  The patient denies anorexia, fever, weight loss, weight gain, vision loss, decreased hearing, hoarseness, chest pain, syncope, peripheral edema, prolonged cough, headaches, hemoptysis, abdominal pain, and severe indigestion/heartburn.    Vital Signs:  Patient profile:  76 year old male Height:      70 inches Weight:      205 pounds BMI:     29.52 O2 Sat:      90 % on Room air Pulse rate:   100 / minute BP sitting:   134 / 70  (left arm) Cuff size:   regular  Vitals Entered By: Reynaldo Minium CMA (July 08, 2009 9:19 AM)  O2 Flow:  Room air  Physical Exam  Additional Exam:  General:  A/Ox3; pleasant and cooperative, NAD, SKIN: no rash, lesions NODES: no lymphadenopathy HEENT: [place]/AT, EOM- WNL, Conjuctivae- clear, PERRLA, TM-WNL, Nose- clear, Throat- clear and wnl, Melampatti II NECK: Supple w/ fair ROM, JVD- none, normal carotid impulses w/o bruits Thyroid-  CHEST: Dry faint crackles left base- noted again  without cough, unlabored, no dullness.Very distant, slow expiratory phase HEART: RRR, no m/g/r heard ABDOMEN- Soft, nontender ZOX:WRUE, nl pulses, no edema  NEURO: Grossly intact to observation       Impression & Recommendations:  Problem # 1:  C O P D (ICD-496) Severe COPD with slowly progressing emphysema. He can use his rescue inhaler a bit more if helpful, and run oxygen at 3-4.  Problem # 2:  PULMONARY NODULE (ICD-518.89)  This has not been evident on recent CXR and is not likely to be active. We will get a CXR at next visit, keeping option for CT open.  Medications Added to Medication List This Visit: 1)  Proventil Hfa 108 (90 Base) Mcg/act Aers (Albuterol sulfate) .... 2 puffs four times a day as needed 2)  Multivitamins Tabs (Multiple vitamin) .... Take 1 by mouth once daily  Other Orders: Est. Patient Level III (45409)  Patient Instructions: 1)  Please schedule a follow-up appointment in 6 months. 2)  Consider trying otc nasal saline gel for dry nose/ watery in AMs 3)  Walking is the best exercise to maintain some stamina. 4)  Symbicort 160, 2 puffs two times a day as alternative to Advair.

## 2010-09-15 NOTE — Assessment & Plan Note (Signed)
Summary: ROV-6 MONTHS/KWP   Visit Type:  Follow-up PCP:  Wyline Beady  Chief Complaint:  check up.  History of Present Illness: COPD Lung nodule   Trying to stay well for a cruise. Feels well now. Got flu shot. Stable mild thickness of phlegm. Cough sometimes trace brown.  DOE stairs and fast walking without change Needed pred burst on trip    Current Allergies: ! * SINGULAIR ! TOLECTIN  Past Medical History:    Reviewed history from 07/24/2007 and no changes required:       C O P D       Allergic Rhinitis       cataract surgery       R rotator cuff       tonsilectomy        appendectomy   Family History:    Reviewed history and no changes required:       Father died CVA       Mother- old age, COPD       Sister died  COPD- smoked       Coronary Heart Disease  Social History:    Patient states former smoker- quit 1992   Risk Factors:  Tobacco use:  quit    Year quit:  1992    Pack-years:  35 years 2 packs daily   Review of Systems       The patient complains of dyspnea on exhertion.  The patient denies fever, weight loss, hoarseness, peripheral edema, prolonged cough, hemoptysis, unusual weight change, and enlarged lymph nodes.     Vital Signs:  Patient Profile:   76 Years Old Male Weight:      208.13 pounds O2 Sat:      92 % Pulse rate:   91 / minute BP sitting:   130 / 72  (left arm) Cuff size:   regular  Vitals Entered By: Darra Lis RMA (August 23, 2007 9:29 AM) Oxygen therapy Room Air             Is Patient Diabetic? No Comments meds reviewed ..................................................................[name] KitchenDarra Lis RMA  August 23, 2007 9:31 AM      Physical Exam  General:     well developed, well nourished, in no acute distress Head:     normocephalic and atraumatic Eyes:     PERRLA/EOM intact; conjunctiva and sclera clear Nose:     no deformity, discharge, inflammation, or lesions Mouth:     no deformity or  lesionsMelampatti Class III.   Neck:     no masses, thyromegaly, or abnormal cervical nodes Chest Wall:     no deformities noted Lungs:     decreased BS bilateral.   Coarse breath sounds left base Heart:     regular rate and rhythm, S1, S2 without murmurs, rubs, gallops, or clicks     Problem # 1:  C O P D (ICD-496) Stable currently. Asked standby pred taper- given with discussion. His updated medication list for this problem includes:    Spiriva Handihaler 18 Mcg Caps (Tiotropium bromide monohydrate) ..... Inhale contents of 1 capsule once a day    Advair Diskus 250-50 Mcg/dose Misc (Fluticasone-salmeterol) ..... Inhale one puff two times a day    Albuterol 90 Mcg/act Aers (Albuterol) ..... Use as needed    Prednisone 5 Mg Tabs (Prednisone) .[name]... 2, 4x daily x 2 days 2, 3x daily x 2 days 2, 2 x daily x 2 days 2, 1 x daily x 2 days  Orders: Est. Patient Level III (46962) T-2 View CXR, Same Day (71020.5TC)   Problem # 2:  Hx of PULMONARY NODULE (ICD-518.89) Due for f/u cxr Orders: Est. Patient Level III (95284) T-2 View CXR, Same Day (71020.5TC)   Medications Added to Medication List This Visit: 1)  Prednisone 5 Mg Tabs (Prednisone) .... 2, 4x daily x 2 days 2, 3x daily x 2 days 2, 2 x daily x 2 days 2, 1 x daily x 2 days   Patient Instructions: 1)  Please schedule a follow-up appointment in 6 months. 2)  A chest x-ray has been recommended.  Your imaging study may require preauthorization. Phone tree for result.    Prescriptions: PREDNISONE 5 MG  TABS (PREDNISONE) 2, 4x daily x 2 days 2, 3x daily x 2 days 2, 2 x daily x 2 days 2, 1 x daily x 2 days  #40 x 5   Entered and Authorized by:   Waymon Budge MD   Signed by:   Waymon Budge MD on 08/23/2007   Method used:   Electronically sent to ...       [facility] [facility]. # [phone]*       [address]       [place], [place]  [address]       Ph: [phone] or [phone]       Fax:  [phone]   RxID:   [id number]  ]

## 2010-09-15 NOTE — Medication Information (Signed)
Summary: [facility]  [facility]   Imported By: Sherian Rein 11/20/2009 13:22:38  _____________________________________________________________________  External Attachment:    Type:   Image     Comment:   External Document

## 2010-09-15 NOTE — Medication Information (Signed)
Summary: [facility]  [facility]   Imported By: Sherian Rein 10/29/2009 13:59:13  _____________________________________________________________________  External Attachment:    Type:   Image     Comment:   External Document

## 2010-09-17 NOTE — Progress Notes (Signed)
Summary: fax request- dr little  Phone Note From Other Clinic   Caller: jan w/ dr Caryn Bee little Call For: young Summary of Call: requests last ov notes and results. fax to jan's attn: [phone].  contact # is [phone] Initial call taken by: Tivis Ringer, CNA,  August 25, 2010 4:53 PM  Follow-up for Phone Call        Faxed records.//Juanita Follow-up by: Darletta Moll,  August 25, 2010 5:01 PM

## 2010-09-23 ENCOUNTER — Ambulatory Visit
Admission: RE | Admit: 2010-09-23 | Discharge: 2010-09-23 | Disposition: A | Discharge status: Home / Self Care | Payer: Medicare Other | Source: Ambulatory Visit | Attending: Family Medicine | Admitting: Family Medicine

## 2010-09-23 DIAGNOSIS — Z136 Encounter for screening for cardiovascular disorders: Secondary | ICD-10-CM

## 2010-10-10 ENCOUNTER — Encounter: Payer: Self-pay | Admitting: Internal Medicine

## 2010-10-27 NOTE — Miscellaneous (Signed)
Summary: [facility]  [facility]   Imported By: Sherian Rein 10/23/2010 07:18:14  _____________________________________________________________________  External Attachment:    Type:   Image     Comment:   External Document

## 2010-11-26 ENCOUNTER — Encounter: Payer: Self-pay | Admitting: Cardiovascular Disease

## 2010-11-26 LAB — PULMONARY FUNCTION TEST

## 2010-12-08 ENCOUNTER — Telehealth: Payer: Self-pay | Admitting: Internal Medicine

## 2010-12-08 ENCOUNTER — Other Ambulatory Visit: Payer: Self-pay | Admitting: Family Medicine

## 2010-12-08 DIAGNOSIS — J449 Chronic obstructive pulmonary disease, unspecified: Secondary | ICD-10-CM

## 2010-12-08 NOTE — Telephone Encounter (Signed)
Katie please advise if you have seen this form come through for pt to go back into pulm rehab. Thanks  Carver Fila, CMA

## 2010-12-08 NOTE — Telephone Encounter (Signed)
I have placed this message and the order forms on CDYs cart.

## 2010-12-10 NOTE — Telephone Encounter (Signed)
OK- Form signed and given to [facility]

## 2010-12-10 NOTE — Telephone Encounter (Signed)
Form completed and faxed to Jamesetta So at [facility] for Maintenance Program. Called pt and advised that this had been completed and that he should here from Rehab in approx 3 wks or earlier to arrange.

## 2010-12-11 ENCOUNTER — Ambulatory Visit
Admission: RE | Admit: 2010-12-11 | Discharge: 2010-12-11 | Disposition: A | Discharge status: Home / Self Care | Payer: Medicare Other | Source: Ambulatory Visit | Attending: Family Medicine | Admitting: Family Medicine

## 2010-12-11 DIAGNOSIS — J449 Chronic obstructive pulmonary disease, unspecified: Secondary | ICD-10-CM

## 2010-12-15 ENCOUNTER — Telehealth: Payer: Self-pay | Admitting: Internal Medicine

## 2010-12-15 ENCOUNTER — Other Ambulatory Visit (HOSPITAL_COMMUNITY): Payer: Self-pay | Admitting: Family Medicine

## 2010-12-15 DIAGNOSIS — R918 Other nonspecific abnormal finding of lung field: Secondary | ICD-10-CM

## 2010-12-15 NOTE — Telephone Encounter (Signed)
I reviewed the recent CT- this shows 2 new nodules, not seen on latest CXR and different from the left lower lobe nodule noted in the past.  I see that PET has already been scheduled- i agree with this. I attempted to reach [name] tonight by his mobile and home numbers- [facility] inviting call at office tomorrow.

## 2010-12-15 NOTE — Telephone Encounter (Signed)
Spoke w/ pt and he has a couple of questions for Dr. Maple Hudson.  1) Pt had CT done on 12/11/10 and want's Dr. Maple Hudson to review it. Pt states this was ordered by his family doctor and was advised their was a nodule on one of his lungs. Pt wants to know if this is the same nodule that Dr. Maple Hudson has been following or if this is something new. Pt CT is in the computer  2) Pt states Dr. Clarene Duke wants pt to have a PET scan done ASAP. Pt wants to know if Dr. Maple Hudson agree's w/ this and if so does he want Dr. Clarene Duke to order this. Pt would like a call back tomorrow asap  Please advise Dr. Maple Hudson, Thanks  Carver Fila, CMA

## 2010-12-16 ENCOUNTER — Telehealth: Payer: Self-pay | Admitting: Internal Medicine

## 2010-12-16 NOTE — Telephone Encounter (Signed)
12/16/10- 2:00PM  I returned his call. He had gone to [facility] for second opinion. Then as separate issue had increased cough with scant sputum, few specks of blood. Dr Clarene Duke got CT showing new lung nodules- different from what we had seen in past and not seen on our latest CXR. CXR vs CT discussed. He now has PET scheduled by Dr Clarene Duke. I explained PET. He will call Dr little to confirm schedule. He will call me after that study is complete.

## 2010-12-21 ENCOUNTER — Encounter (HOSPITAL_COMMUNITY)
Admission: RE | Admit: 2010-12-21 | Discharge: 2010-12-21 | Disposition: A | Discharge status: Home / Self Care | Payer: Medicare Other | Source: Ambulatory Visit | Attending: Family Medicine | Admitting: Family Medicine

## 2010-12-21 DIAGNOSIS — J984 Other disorders of lung: Secondary | ICD-10-CM | POA: Insufficient documentation

## 2010-12-21 DIAGNOSIS — R918 Other nonspecific abnormal finding of lung field: Secondary | ICD-10-CM

## 2010-12-21 DIAGNOSIS — Z7982 Long term (current) use of aspirin: Secondary | ICD-10-CM | POA: Insufficient documentation

## 2010-12-21 DIAGNOSIS — Z79899 Other long term (current) drug therapy: Secondary | ICD-10-CM | POA: Insufficient documentation

## 2010-12-21 LAB — GLUCOSE, CAPILLARY: Glucose-Capillary: 107 mg/dL — ABNORMAL HIGH (ref 70–99)

## 2010-12-21 MED ORDER — FLUDEOXYGLUCOSE F - 18 (FDG) INJECTION
18.9000 | Freq: Once | INTRAVENOUS | Status: AC | PRN
  Administered 2010-12-21: 18.9 via INTRAVENOUS

## 2010-12-23 ENCOUNTER — Telehealth: Payer: Self-pay | Admitting: Internal Medicine

## 2010-12-23 DIAGNOSIS — R911 Solitary pulmonary nodule: Secondary | ICD-10-CM

## 2010-12-23 NOTE — Telephone Encounter (Signed)
Spoke w/ [name] and he states he had a PET scan done on Monday ordered by Dr. Clarene Duke. [name] is wanting Dr. Roxy Cedar input on it and would like him to call [name] when he has reviewed this. Please advise Dr. Maple Hudson. Thanks.

## 2010-12-23 NOTE — Telephone Encounter (Signed)
Phone- I returned Mr Breithaupt's call after reviewing PET. He has mild uptake in a small peripheral LLL nodule, and some other nodules which were negative, but too small for PET.  Dr Clarene Duke made him appt tomorrow at [facility] which I suggested he keep. I will discuss tomorrow at Thoracic Oncology XRay conf. We discussed needle bx vs Direct surgical referral.

## 2010-12-24 ENCOUNTER — Encounter (INDEPENDENT_AMBULATORY_CARE_PROVIDER_SITE_OTHER): Payer: Medicare Other

## 2010-12-24 DIAGNOSIS — D381 Neoplasm of uncertain behavior of trachea, bronchus and lung: Secondary | ICD-10-CM

## 2010-12-25 NOTE — Telephone Encounter (Signed)
Spoke with pt.  He states that he wants to know if it was decided that he does need to proceed with FNA.  Will forward to CDY, pls advise thanks!

## 2010-12-25 NOTE — Telephone Encounter (Signed)
Patient called back stated that he had some questions for Dr Maple Hudson, he stated that he spoke to him the day before yesterday but he still has some questions for Dr Maple Hudson. He can be reached at [phone]//me

## 2010-12-25 NOTE — Telephone Encounter (Signed)
Phone- I returned his call. He met with Dr Tyrone Sage yesterday at [facility]. We have decided to proceed with needle biopsy of the left lung PET positive lesion. He is making an appointment to come in with his wife for a broader consultative discussion about his lung disease and where he is going.   He will need referral to Interventional Radiology to schedule needle bx of PET scan positive left lung nodule

## 2010-12-25 NOTE — H&P (Signed)
HISTORY AND PHYSICAL EXAMINATION  Dec 25, 2010  Re:  LAYTHAN, HAYTER        DOB:  09-02-1934  PRIMARY CARE PHYSICIAN:  Dr. Catha Gosselin.  REASON FOR CONSULTATION:  Left upper lobe lung nodule, question malignancy.  HISTORY OF PRESENT ILLNESS:  The patient is a 76 year old male with known underlying emphysematous lung disease followed by Dr. Jetty Duhamel.  Because of increasing dyspnea with minor exertion, increasing oxygen demands and need for oxygen full-time, he saw Dr. Aubery Lapping in the [facility] in April.  At that time, his assessment was that he had end-stage obstructive lung disease with obvious decline.  In 2006, his DLCO was 52% of predicted.  His FEV-1 was 1.1, 38% of predicted.  Recent pulmonary function studies done at [facility] showed a decline of FEV-1 to 820 mL, 25% of predicted with normal lung volumes. His DLCO had decreased to 38% of predicted.  At that time a CT scan was recommended which was done.  The patient has had known left lower lobe lung nodules and nodules on the right.  The repeat CT scan was done on December 11, 2010, and soon after a PET scan.  This showed a mild metabolic activity and a left upper lobe nodule that was indeterminate, SUV was 2.0.  The area in question was 12 mm x 16 mm.  There is also a 7 mm left upper lung nodule and a right upper lobe 8-mm nodule that did not have any appreciable uptake.  Radiographically this lesion appeared indeterminate as far as concern for malignancy.  There was no evidence of hypermetabolic mediastinal nodes.  The patient is referred to the [facility] by Dr. Maple Hudson for consideration of diagnostic and treatment options.  The patient has had longstanding chronic obstructive pulmonary disease. Over the past 2 years, he has had decreased overall function, increasing shortness of breath, increasing oxygen demands.  He has been on oxygen intermittently for the past 2 years but now is on  at 100% of the time. He is a previous smoker but stopped in 1992, has worked in Radiation protection practitioner, has no obvious exposures.  PAST MEDICAL HISTORY:  Significant as noted above for; 1. COPD. 2. Adenomatous colon polyps. 3. History of paroxysmal atrial tachycardia but without any     recurrence, has been on digoxin since the 1980s.  Episode of chest     pain 4-5 Years ago, had a cardiac catheter catheterization at that     time.  By the patient's report, mild distal coronary disease and     treated medically. 4. History of essential hypertension. 5. History of elevated PSA with negative biopsies.  Previous surgery includes bilateral cataracts, appendectomy, tonsillectomy, rotator cuff repair.  FAMILY HISTORY AND SOCIAL HISTORY:  The patient's father died of stroke at age 48.  Mother had a stroke, carcinoma.  He has a history of smoking but quit in 1992.  Occasional alcohol use.  The patient is married, has 2 children.  ALLERGIES:  TOLECTIN which caused anaphylaxis.  CURRENT MEDICATIONS:  Proventil, Mucinex, amlodipine, Spiriva, Symbicort, finasteride, Diovan, alprazolam, digoxin, simvastatin, nitroglycerin.  PHYSICAL EXAMINATION:  The patient appears stated age of 81 years.  He is in the office on oxygen at 3 L.  His blood pressure is 142/72, pulse is 90, respiratory rate 20, O2 sats 92% on 3 L.  He is 78 inches tall, 192 pounds.  He is afebrile.  The patient is awake, alert, neurologically intact and able to  relate his history in good detail.  He does not have carotid bruits.  His breath sounds are without rhonchi or wheezing.  He has very distant breath sounds.  Cardiac exam reveals regular rate and rhythm.  I do not appreciate any murmur consistent with aortic stenosis.  His abdominal exam is benign without palpable masses.  The aorta is not palpably enlarged.  He has palpable femoral pulses.  He has no calf tenderness or swelling.  He has 2+ DP and PT pulses  distally.  REVIEW OF SYSTEMS:  The patient does note increasing fatigue, increasing shortness of breath.  He has no hemoptysis.  Denies fever, chills or night sweats.  His flu shots up-to-date 2011.  Pneumococcal vaccination is up-to-date.  He denies hemoptysis, respiratory difficulties as noted above.  Denies any recent chest pain.  He is up-to-date on his colonoscopy.  He has a history of elevated PSA.  Other review of systems are negative.  The patient's PET scan CT scan are reviewed and discussed in [facility] conference this morning.  The lesion in the left upper lobe as noted that is indeterminate as far as SUV is suspicious as it is not known to be present for a long period of time.  It is peripherally positioned.  IMPRESSION:  Question of new left upper lobe lung nodule, indeterminate for malignancy.  I have discussed these findings with the patient.  He would be extremely poor candidate to proceed just with surgical resection.  He potentially could be a candidate for radiotherapy IMRT if the lesion was positive for malignancy, though it is close to a rib that could present some technical difficulties.  I spent more than 45 minutes discussing with the patient the risks and his options of just following up with a CT scan in 3 months versus needle biopsy in the near future. The patient had his questions answered and understands the issues involved.  He voiced that he probably would like to proceed with needle biopsy and obtain a tissue diagnosis.  I have discussed with him the risk of needle biopsy including pneumothorax and the risk of a negative needle biopsy in fact being positive 10-15% of the time depending on the situation.  He would like to discuss this with Dr. Jetty Duhamel and then we will call back the office in 24-48 hours to let us know if he would like to proceed with scheduling a needle biopsy pending this and tentatively made him a return appointment to the [facility]  if we proceed with a needle biopsy in the near future.  If not, we will make arrangements for a followup CT scan.  Sheliah Plane, MD Electronically Signed  EG/MEDQ  D:  12/25/2010  T:  12/25/2010  Job:  [id number]  cc:   Joni Fears D. Young, MD, FCCP, FACP Anna Genre. Little, M.D. Aubery Lapping

## 2010-12-29 ENCOUNTER — Telehealth: Payer: Self-pay | Admitting: Internal Medicine

## 2010-12-29 NOTE — Assessment & Plan Note (Signed)
[facility]                             PULMONARY OFFICE NOTE   NAME:[name], [name]                     MRN:          [id number]  DATE:02/24/2007                            DOB:          [birth year]    PROBLEMS:  1. Chronic obstructive pulmonary disease.  2. TOLECTIN allergy.  3. Bursitis.  4. Allergic rhinitis.  5. History of left lower lobe nodule.   HISTORY:  He feels he is doing pretty well for this time of year,  basically stable.  He uses oxygen only for exercise.  He is not using  theophylline and has had no recent need for his metered inhaler or  prednisone.  He does have some prednisone available and we discussed  p.r.n. use again.  There is little cough.  He finds he is clearing his  throat occasionally but does not recognize reflux symptoms and there has  been nothing bloody or purulent.   MEDICATIONS:  1. Spiriva.  2. Advair 250/50.  3. Lanoxin 0.25 mg.  4. Norvasc 5 mg.  5. Zocor 40 mg.  6. Diovan 80 mg.  7. Oxygen at 2 liters with exercise.  8. Albuterol rescue inhaler.   DRUG INTOLERANT:  1. SINGULAIR.  2. TOLECTIN.   OBJECTIVE:  VITAL SIGNS:  Weight 203 pounds, BP 136/72, pulse regular at  91, room air saturation 92%.  HEENT:  His throat looks fine.  Voice quality is normal.  There is no  stridor.  No neck vein distention.  CHEST:  Quiet with no rales, rhonchi, or wheeze.  HEART:  Sounds are regular without murmur.  EXTREMITIES:  There is no peripheral edema.   IMPRESSION:  1. Chronic obstructive pulmonary disease.  2. He has had a chronic left lower lobe nodule, which seemed stable in      the past.   PLAN:  1. Schedule return 6 months, planning a chest x-ray then.  We refilled      prednisone 5 mg #50, to take 2-3 daily for short term use only      during significant exacerbations as discussed with      steroid talk done.  2. He can try Claritin for his sense of throat clearing occasionally       p.r.n.     Clinton D. Maple Hudson, MD, Tonny Bollman, FACP  Electronically Signed    CDY/MedQ  DD: 02/24/2007  DT: 02/26/2007  Job #: [id number]   cc:   Caryn Bee L. Little, M.D.

## 2010-12-29 NOTE — Telephone Encounter (Signed)
Spoke with [facility] and they state that for the CT biopsy they have to fax records for the radiologists to review and then the pt will be called about an appt. The pt also wanted to r/s his appt for 01-15-11. It needs to be a slot per CY because he is going to discuss pt prognosis with wife. Katie requested that the message be sent to her and she will review CY schedule and decide on appt.Carron Curie, CMA

## 2010-12-29 NOTE — Telephone Encounter (Signed)
Order sent to [facility] to set up.

## 2010-12-30 ENCOUNTER — Telehealth: Payer: Self-pay | Admitting: Internal Medicine

## 2010-12-30 NOTE — Telephone Encounter (Signed)
Will forward to Dr. Maple Hudson so he is aware. Please advise Dr. Maple Hudson. Thanks  Carver Fila, CMA

## 2010-12-30 NOTE — Telephone Encounter (Signed)
Spoke with patient-biopsy set for 01-06-11 and ROV with CY on 01-25-11(pt will be out of town from 01-16-11 through 01-23-11). Will forward to CY as FYI.

## 2010-12-30 NOTE — Telephone Encounter (Signed)
Pt is aware that biopsy is set for 5-23 and ROV with CY on 01-25-11; pt is concerned if CY think Dr Maia Plan is recommend by CY to do biopsy-CY please advise and let pt know. Thanks.

## 2010-12-31 NOTE — Telephone Encounter (Signed)
Per CY-Dr Bonnielee Haff is very good(interventional radiologist).[name]

## 2011-01-01 NOTE — H&P (Signed)
[place]  Patient:    ERMAN, THUM Visit Number: [id number] MRN: [id number]          Service Type: MED Location: [id number] Attending Physician:  Corliss Marcus Dictated by:   Anselm Lis, N.P. Admit Date:  02/20/2002 Discharge Date: 02/21/2002   CC:         Caryn Bee L. Little, M.D.   History and Physical  PRIMARY CARE Lexis Potenza:  Caryn Bee L. Little, M.D.  DATE OF BIRTH:  [birth year]  HISTORY OF PRESENT ILLNESS:  The patient is a pleasant 76 year old male who developed spontaneous lower substernal aching chest pain when sitting at his desk working on the day of admission.  No radiation, nausea, or diaphoresis. Worse with deep breaths.  No recent immobility.  He received one sublingual nitrate at Dr. Darrol Poke office with transient improvement.  Chest pain worsened in route to the emergency room, and he received second nitrate without improvement, but began having relief of pain with IV nitrates during time of initial cardiology evaluation at 1900 hours.  Cardiac risk factors include age, male sex, hypertension, elevated cholesterol, and initially high C-reactive protein at 8.97.  PAST MEDICAL HISTORY: 1. History of severe COPD. 2. History of paroxysmal atrial tachycardia but without recent symptoms on    digoxin. 3. History of hypertension on Diovan. 4. History of elevated LDL. 5. History of prostatitis followed by Dr. Vonita Moss.  Prior prostate biopsy was    negative.  PSA at 4.6 in September 2002.  ALLERGIES:  TOLECTIN with anaphylaxis.  Is tolerant to aspirin.  MEDICATIONS:  Lanoxin 0.25 mg p.o. q.d., Advair 250/50 one puff b.i.d., Combivent MDI two puffs q.i.d., Diovan 80 mg p.o. q.d.  SOCIAL HISTORY AND HABITS:  EtOH:  Drinks three alcoholic beverages a day. Tobacco:  Negative.  The patient is married.  REVIEW OF SYSTEMS:  Denies problems with light-headedness, dizziness, syncope, or near syncopal episodes.  Problem  with COPD with dyspnea on exertion, followed by Dr. Maple Hudson.  He denies a history of thyroid disease.  Negative constipation, diarrhea, black or tarry-looking stool, or melena.  Negative GERD.  No history of dysuria and no hematuria.  Complains of bursitis affecting both hips.  Negative insomnia.  PHYSICAL EXAMINATION:  (As performed by Dr. Corliss Marcus).  VITAL SIGNS:  Blood pressure 138/72, pulse 104, respiratory rate 22, and O2 saturations 99%, temperature 97.8.  GENERAL:  This is an anxious older gentleman in no apparent distress.  HEENT:  Brisk bilateral carotid upstrokes without bruit.  NECK:  There is no JVD, no thyromegaly.  CARDIAC:  Regular rate and rhythm without murmur, rub, or gallop.  Normal S1 and S2.  CHEST:  Decreased breath sounds bilaterally.  ABDOMEN:  Soft, nondistended, with normal bowel sounds.  Negative abdominal aortic, renal, and femoral bruit.  Nontender to applied pressure.  No masses or organomegaly appreciated.  NEUROLOGIC:  Cranial nerves II-XII are intact.  Alert and oriented x2.  GENITORECTAL:  Exam deferred.  LABORATORY DATA:  EKG revealed ST depression with pain 1 mm inferolateral leads, resolved with pain relief.  He has slight ST depression at baseline secondary to digoxin.  Chest CT is negative for evidence of pulmonary emboli nor DVT.  Negative CHF.  Chest x-ray was negative for active disease.  WBC of 13.9, hemoglobin 13.8, hematocrit 41.7, platelets of 277.  Pro time 12.4, INR 0.9, PTT 32.  Sodium 140, potassium 4.3, chloride 102, CO2 30, glucose 110, BUN 12, creatinine 0.8.  LFTs overall within normal range, though a slightly elevated SGOT of 74, SGPT at 48.  First CK is 70 and MB fraction 1.2, troponin I of 0.03.  IMPRESSION:  (As dictated by Dr. Corliss Marcus). Symptoms suspicious for unstable angina pectoris in a 76 year old gentleman with a history of elevated cholesterol, hypertension.  The electrocardiogram is suspicious for  inferior ischemia  PLAN:  (As dictated by Dr. Corliss Marcus). 1. Aspirin, Plavix, Lovenox, IV nitrates, and beta blockers. 2. The patient was counseled regarding expected plans for coronary    angiography with possible percutaneous intervention is indicated and able.    The risks, potential complications, benefits, and alternative procedures    were discussed in detail.  The patient indicates his questions and concerns    have been addressed and is agreeable to proceed. Dictated by:   Anselm Lis, N.P. Attending Physician:  Corliss Marcus DD:  02/21/02 TD:  02/24/02 Job: [id number]

## 2011-01-01 NOTE — Assessment & Plan Note (Signed)
[facility]                               PULMONARY OFFICE NOTE   NAME:[name], [name]                     MRN:          [id number]  DATE:02/18/2006                            DOB:          12-Feb-1935    PROBLEM:  1.  Chronic obstructive pulmonary disease.  2.  Tolectin allergy.  3.  Bursitis.  4.  Allergic rhinitis.  5.  History of left lower lobe nodule (stable since 2004).   HISTORY:  When he was here in May, we let him try Theophylline at 200 mg  b.i.d.  That caused insomnia and tachycardia.  He split it to 100 mg b.i.d.,  which he tolerates comfortably.  It is less clear if it is helpful, and we  discussed whether to continue.  He had a persistent sense of dyspnea in  March and April, which has now resolved.  It is unclear whether that was a  response to the theophylline now at 100 mg b.i.d., together with an increase  in his Advair to 500/50.  He just dropped Advair back down to 250/50 and  feels that he is pretty stable with little cough, no chest pain or  palpitation, no ankle edema.   MEDICATIONS:  1.  Spiriva once daily.  2.  Advair now at 250/50.  3.  Lanoxin 0.25 mg.  4.  Norvasc 5 mg  5.  Zocor 40 mg.  6.  Diovan 80 mg.  7.  Theophylline 100 mg b.i.d.  8.  Albuterol inhaler, rarely needed.   ALLERGIES:  1.  Drug intolerance to SINGULAIR.  2.  Anaphylaxis to TOLECTIN.   OBJECTIVE:  VITAL SIGNS:  Weight 206 pounds.  Blood pressure 128/70, pulse  regular at 89, room air saturation 94%.  CHEST:  Quiet clear chest with slow expiratory phase, but no increased work  of breathing.  HEART:  Heart sounds are regular without murmur or gallop.  P2 does not seem  increased.  There is no clubbing, cyanosis, edema or neck vein distention.   IMPRESSION:  Asthma with chronic obstructive pulmonary disease, currently  stable.   PLAN:  1.  Continue Advair at 250/50.  2.  We are going to test theophylline benefit by dropping to 100 mg  once      daily.  He will      increase back to b.i.d. if he feels he needs it.  3.  Schedule return in 6 months, earlier p.r.n.                                   Clinton D. Maple Hudson, MD, FCCP, FACP   CDY/MedQ  DD:  02/27/2006  DT:  02/27/2006  Job #:  [id number]   cc:   Caryn Bee L. Little, MD

## 2011-01-01 NOTE — Telephone Encounter (Signed)
Spoke with pt and notified of recs per CDY. Pt verbalized understanding.  

## 2011-01-01 NOTE — Op Note (Signed)
NAME:  [name], FRETT NO.:  [id number]   MEDICAL RECORD NO.:  [id number]                   PATIENT TYPE:  AMB   LOCATION:  ENDO                                 FACILITY:  [facility]   PHYSICIAN:  Petra Kuba, M.D.                 DATE OF BIRTH:  Dec 28, 1934   DATE OF PROCEDURE:  09/12/2002  DATE OF DISCHARGE:                                 OPERATIVE REPORT   PROCEDURE:  Colonoscopy.   INDICATIONS FOR PROCEDURE:  Screening.   Consent was signed after risks, benefits, methods, and options were  thoroughly discussed in the office.   MEDICINES USED:  Demerol 80, Versed 8.   DESCRIPTION OF PROCEDURE:  Rectal inspection was pertinent for external  hemorrhoids, small. Digital exam was negative. The pediatric video  adjustable colonoscope was inserted, easily advanced around the colon to the  cecum. This did not require any abdominal pressure or any position changes.  In fact, the scope was inserted a short ways into the terminal ileum which  was normal. Photo documentation was obtained. The scope was slowly  withdrawn. On insertion other than a rare early left sided diverticula, no  abnormalities were seen. The prep was adequate. There was some liquid stool  that required washing and suctioning. On slow withdrawal through the colon,  the cecum and the ascending were normal. In the approximate level of the  hepatic flexure, a small polyp was seen and was cold biopsied x2 and hot  biopsied x2. The hot biopsy forceps fell off the polyp a couple of times and  that is why it was cold biopsied. The specimens were put in the first  container. The scope was slowly withdrawn. The rest of the transverse was  normal. The scope was withdrawn around the splenic flexure. A small polyp  was seen, initially seen and unfortunately we cut through it before we could  apply cautery and this polyp was removed, suctioned through the scope and  collected in the trap. We went  ahead and hot biopsied the base x1 and put  that in the same second container. The scope was further withdrawn. Other  than the rare early left sided diverticula, no other abnormalities were seen  as we slowly withdrew back to the rectum. Once back in the rectum, the scope  was retroflexed pertinent for some internal hemorrhoids. The scope was  straightened and readvanced a short ways up the left side of the colon, air  was suctioned, scope removed. The patient tolerated the procedure well.  There was no obvious or immediate complications.   ENDOSCOPIC DIAGNOSIS:  1. Internal and external hemorrhoids.  2. Early left sided diverticula.  3. Small descending polyp status post snare and hot biopsy of the base.  4. Hepatic flexure small polyp cold biopsied.  5. Otherwise within normal limits to the terminal ileum.   PLAN:  Await pathology to determine future  colonic screening. Happy to see  back p.r.n., otherwise, return care to Dr. Clarene Duke for the customary health  care maintenance to include yearly rectals and guaiacs.                                               Petra Kuba, M.D.   MEM/MEDQ  D:  09/12/2002  T:  09/12/2002  Job:  [id number]

## 2011-01-01 NOTE — Assessment & Plan Note (Signed)
[name]                             PULMONARY OFFICE NOTE   NAME:[name], [name] YERGER                     MRN:          [id number]  DATE:08/18/2006                            DOB:          1935/03/25    PROBLEM:  1. Chronic obstructive pulmonary disease.  2. Tolectin allergy.  3. Bursitis.  4. Allergic rhinitis.  5. History of left lower lobe nodule.   HISTORY:  Mild cold over the past week.  He has had home oxygen used  only for exercise.  He paces himself for activities of daily living.   MEDICATIONS:  1. Spiriva.  2. Advair 250/50.  3. Lanoxin 0.25 mg.  4. Norvasc 5 mg.  5. Zocor 40 mg.  6. Diovan 80 mg.  7. Oxygen at 2 L for exercise.  8. Albuterol inhaler, is rarely used.   DRUG INTOLERANCE:  SINGULAIR AND TO TOLECTIN WHICH CAUSED ANAPHYLAXIS.   OBJECTIVE:  Weight 206 pounds.  BP 126/62.  Pulse regular 82.  Room air  saturation 93%.  Breath sounds are diminished, expiratory phase is slow but unlabored.  There are no rales or wheezes.  HEART:  Sounds are regular without murmur.  There is no neck vein distension or peripheral edema.   IMPRESSION:  Minor viral syndrome, otherwise stable with advanced  chronic obstructive pulmonary disease.   PLAN:  1. We renewed his handicapped parking.  2. Standby prescription for Cipro 500 mg b.i.d. for 7 days to hold      this winter.  3. Sample Advair 250/50 to bridge until his mail order comes in.  4. Okay to use Albuterol Rescue inhaler before exercise.  5. Retry theophylline 100 mg b.i.d. for p.r.n. use after discussion.  6. Standby prescription prednisone 5 mg #50 to take 2 or 3 daily when      needed.  Careful      steroid discussion was done.  7. Schedule return 6 months, but earlier p.r.n.     Clinton D. Maple Hudson, MD, Tonny Bollman, FACP  Electronically Signed    CDY/MedQ  DD: 08/20/2006  DT: 08/20/2006  Job #: [id number]   cc:   Caryn Bee L. Little, M.D.

## 2011-01-01 NOTE — Telephone Encounter (Signed)
Per CY-Dr Bonnielee Haff is a good dr to have do this.

## 2011-01-01 NOTE — Cardiovascular Report (Signed)
[place]  Patient:    [name], [name] Visit Number: [id number] MRN: [id number]          Service Type: MED Location: [id number] Attending Physician:  Corliss Marcus Dictated by:   Armanda Magic, M.D. Proc. Date: 02/21/02 Admit Date:  02/20/2002 Discharge Date: 02/21/2002   CC:         Caryn Bee L. Little, M.D.   Cardiac Catheterization  DATE OF BIRTH: 07/21/1935  REFERRING PHYSICIANS: Francisca December, M.D. and Anna Genre. Little, M.D.  PROCEDURES PERFORMED: Left heart catheterization, coronary angiography, and left ventriculography.  OPERATOR: Armanda Magic, M.D.  INDICATIONS: Chest pain.  COMPLICATIONS: None.  INTERVENOUS ACCESS: Via right femoral artery, 6 French sheath.  HISTORY OF PRESENT ILLNESS: This is a 76 year old white male with a remote history of paroxysmal atrial fibrillation in the past, otherwise no cardiac abnormality, who developed spontaneous substernal chest pain while working at his desk yesterday. Cardiac enzymes were negative. He was noted to have slightly elevated LFTs.  He did have some transient ST changes in the anterolateral precordial leads. The patient now presents for cardiac catheterization.  DESCRIPTION OF PROCEDURE: The patient is brought to the cardiac catheterization laboratory in a fasting, nonsedated state.  Informed consent was obtained.  The patient was connected to continuous heart rate and pulse oximetry monitoring, and intermittent blood pressure monitoring. The right groin was prepped and draped in a sterile fashion.  Lidocaine 1% was used for local anesthesia.  Using the modified Seldinger technique, a 6 French sheath was placed in the right femoral artery.  Under fluoroscopic guidance, a 6 [place] JL4 catheter was placed in the left coronary artery. Multiple cine films were taken in a 30-degree RAO, 40-degree LAO views. This catheter was then exchanged out over a wire and a 6 [place]  JR4 catheter was was placed under fluoroscopic guidance to the right coronary artery. Multiple cine films were taken in the 30-degree RAO, 40-degree LAO views. This catheter was then exchanged out over a guide wire for a 6 French angled pigtail catheter, which was placed in the left ventricle cavity. Left ventriculography was performed in a 30-degree RAO view using a total of 30 cc of contrast at 13 cc/sec. The catheter was then pulled back across the aortic valve with no significant aortic valve gradient noted. At the end of the procedure, all catheters and sheaths were removed. Manual compression was performed until adequate hemostasis was obtained. The patient was transferred back to his room in stable condition.  RESULTS: 1. The left main coronary artery is widely patent and bifurcates in the    left anterior descending artery and left circumflex artery. 2. The left anterior descending artery has a 20% narrowing just after the    takeoff of a first diagonal branch which is widely patent. The rest of the    LAD is widely patent throughout its course and traverses to the apex. It    does give off a second diagonal branch which is widely patent as    well. 3. The ramus branch is a very small branch with an 80% ostial narrowing. 4. The left circumflex gives rise to a very high obtuse marginal branch,    which is widely patent. In the midportion of the left circumflex, it    gives rise to two additional obtuse marginal branches #2 and 3 which are    widely patent. It gives rise to a third obtuse marginal branch which is  widely patent and then distally bifurcates into a posterior descending    artery and a fifth obtuse marginal branch, all of which are widely    patent. The left circumflex is widely patent throughout its course. 5. The right coronary artery is nondominant and small and is widely    patent throughout its course.  LEFT VENTRICULOGRAM: Left ventriculography performed in  the 30-degree RAO view using a total of 30 cc of contrast at 13 cc/sec. showed normal LV function. The aortic pressure is 115/83 mmHg, LV  pressure 107/15 mmHg.  ASSESSMENT: 1. Noncardiac chest pain versus coronary vasospasm. 2. Nonobstructive coronary artery disease. 3. Normal left ventricular function. 4. Elevated liver function tests.  PLAN: 1. Discharge to home today after bedrest and IV fluids. 2. Imdur 30 mg a day for possible vasospasm. 3. Followup of LFT abnormality as an outpatient by Dr. Clarene [facility]. Dictated by:   Armanda Magic, M.D. Attending Physician:  Corliss Marcus DD:  02/21/02 TD:  02/24/02 Job: [id number]

## 2011-01-06 ENCOUNTER — Ambulatory Visit (HOSPITAL_COMMUNITY)
Admission: RE | Admit: 2011-01-06 | Discharge: 2011-01-06 | Disposition: A | Discharge status: Home / Self Care | Payer: Medicare Other | Source: Ambulatory Visit | Attending: Internal Medicine | Admitting: Internal Medicine

## 2011-01-06 ENCOUNTER — Ambulatory Visit (HOSPITAL_COMMUNITY): Payer: Medicare Other

## 2011-01-06 ENCOUNTER — Telehealth: Payer: Self-pay | Admitting: Internal Medicine

## 2011-01-06 ENCOUNTER — Other Ambulatory Visit: Payer: Self-pay | Admitting: Internal Medicine

## 2011-01-06 DIAGNOSIS — J984 Other disorders of lung: Secondary | ICD-10-CM | POA: Insufficient documentation

## 2011-01-06 DIAGNOSIS — R911 Solitary pulmonary nodule: Secondary | ICD-10-CM

## 2011-01-06 DIAGNOSIS — R222 Localized swelling, mass and lump, trunk: Secondary | ICD-10-CM | POA: Insufficient documentation

## 2011-01-06 LAB — CBC
HCT: 43 % (ref 39.0–52.0)
Hemoglobin: 14 g/dL (ref 13.0–17.0)
MCH: 27.7 pg (ref 26.0–34.0)
MCHC: 32.6 g/dL (ref 30.0–36.0)
MCV: 85.1 fL (ref 78.0–100.0)
Platelets: 247 10*3/uL (ref 150–400)
RBC: 5.05 MIL/uL (ref 4.22–5.81)
RDW: 13.8 % (ref 11.5–15.5)
WBC: 9.9 10*3/uL (ref 4.0–10.5)

## 2011-01-06 LAB — APTT: aPTT: 30 seconds (ref 24–37)

## 2011-01-06 LAB — PROTIME-INR
INR: 0.97 (ref 0.00–1.49)
Prothrombin Time: 13.1 seconds (ref 11.6–15.2)

## 2011-01-06 NOTE — Telephone Encounter (Signed)
Will forward msg to CDY

## 2011-01-06 NOTE — Telephone Encounter (Signed)
Encounter signed in error. Will forward to Dr Maple Hudson.

## 2011-01-12 ENCOUNTER — Encounter: Payer: Self-pay | Admitting: Internal Medicine

## 2011-01-15 ENCOUNTER — Ambulatory Visit: Payer: Medicare Other | Admitting: Internal Medicine

## 2011-01-20 NOTE — Progress Notes (Signed)
Quick Note:  Pt aware of results. ______ 

## 2011-01-22 ENCOUNTER — Encounter: Payer: Self-pay | Admitting: Internal Medicine

## 2011-01-25 ENCOUNTER — Ambulatory Visit (INDEPENDENT_AMBULATORY_CARE_PROVIDER_SITE_OTHER): Payer: Medicare Other | Admitting: Internal Medicine

## 2011-01-25 ENCOUNTER — Encounter: Payer: Self-pay | Admitting: Internal Medicine

## 2011-01-25 VITALS — BP 124/76 | HR 77 | Ht 70.0 in | Wt 192.4 lb

## 2011-01-25 DIAGNOSIS — J984 Other disorders of lung: Secondary | ICD-10-CM

## 2011-01-25 DIAGNOSIS — J449 Chronic obstructive pulmonary disease, unspecified: Secondary | ICD-10-CM

## 2011-01-25 MED ORDER — PREDNISONE (PAK) 10 MG PO TABS: ORAL_TABLET | ORAL | Status: DC

## 2011-01-25 NOTE — Progress Notes (Signed)
Subjective:    Patient ID: [name], male    DOB: 29-Aug-1934, 76 y.o.   MRN: [id number]  HPI 01/25/11- 27 yo M former smoker followed for COPD/ emphysema with recent concern for pulmonary nodules. Last here May 20, 2010- note reviewed. CT done during the winter showed nodules.  PET positive nodule Left lung faded before needle bx. He had gone to [facility] for second opinion w/ Dr Otto Herb where severity of his advanced lung disease was emphasized.  lung volume reduction and transplant were mentioned although I don't know how seriously, given his age.[name] He comes today to update and to have conference including his wife:  Conference on overall status: He had concern about amount of radiation from imaging and I reminded him that is why we chose to do CXR 05/20/10 which showed stable COPD. PFT 07/01/05 - FEV1 1.13/ 0.39; FEV1/FVC 0.25; DLCO 0.52; insignif response to bronchodilator. PFT at [facility] 11/26/10- FEV1 0.82/ 0.25; FEV1/FVC 0.22 Quality of life and prognosis discussed. He has noted some decline over past year. He could walk through [employer] without O2 then and no longer can. Emphysema pattern with little cough, wheeze, phlegm. No chest pain, palpitation or ankle edema. After long discussion we agreed to an inquiry to [facility] Transplant coordinator for consideration. I told him his age made acceptance unlikely. We discussed limited role of meds,He has had Pneumovax, annual flu vax, Pulmonary Rehab, and test normal for a1AT in the past. Has O2 2l/m for sleep and exertion, home nebulizer. He asks another script for prednisone to hold and we discussed side effects.   Review of Systems Constitutional:   No weight loss, night sweats,  Fevers, chills, fatigue, lassitude. HEENT:   No headaches,  Difficulty swallowing,  Tooth/dental problems,  Sore throat,                No sneezing, itching, ear ache, nasal congestion, post nasal drip,   CV:  No- chest pain, orthopnea, PND, swelling in lower  extremities, anasarca, dizziness, palpitations  GI  No- heartburn, indigestion, abdominal pain, nausea, vomiting, diarrhea, change in bowel habits, loss of appetite  Resp:   No excess mucus, no productive cough,  No non-productive cough,  No coughing up of blood.  No change in color of mucus.  No wheezing.   Skin: no rash or lesions.  GU: no dysuria, change in color of urine, no urgency or frequency.  No flank pain.  MS:  No joint pain or swelling.  No decreased range of motion.  No back pain.  Psych:  No change in mood or affect. No depression or anxiety.  No memory loss.   Litrtle cough/ phlegmNo chest pain, palptiation, syncope or swelling feetr   Objective:   Physical Exam General- Alert, Oriented, Affect-appropriate, Distress- none acute   Brought his O2 but didn't wear it.  Skin- rash-none, lesions- none, excoriation- none  Lymphadenopathy- none  Head- atraumatic  Eyes- Gross vision intact, PERRLA, conjunctivae clear, secretions  Ears- Hearing, canals clear  Nose- Clear, No-Septal dev, mucus, polyps, erosion, perforation   Throat- Mallampati II , mucosa clear , drainage- none, tonsils- atrophic  Neck- flexible , trachea midline, no stridor , thyroid nl, carotid no bruit  Chest - symmetrical excursion , unlabored     Heart/CV- RRR , no murmur , no gallop  , no rub, nl s1 s2                     - JVD-  none , edema- none, stasis changes- none, varices- none     Lung- distant, unlabored, clear. , wheeze- none, cough- none , dullness-none, rub- none     Chest wall- Abd- tender-no, distended-no, bowel sounds-present, HSM- no  Br/ Gen/ Rectal- Not done, not indicated  Extrem- cyanosis- none, clubbing, none, atrophy- none, strength- nl  Neuro- grossly intact to observation         Assessment & Plan:

## 2011-01-25 NOTE — Patient Instructions (Addendum)
I will contact [facility] transplant coordinator about their program.  Script for prednisone burst- use it sparingly as discussed  Cc Dr Clarene [facility], Dr Otto Herb, Mr Welker

## 2011-01-25 NOTE — Assessment & Plan Note (Addendum)
Laregest nodule- left periphery- has largely resolved. Denies night sweats, change of cough. Recognize posibility of MAIC or other atypical infection. No sputum, fever or sweats. We will watch these now. Consider bronchial lavage later if needed

## 2011-01-30 ENCOUNTER — Encounter: Payer: Self-pay | Admitting: Internal Medicine

## 2011-01-30 NOTE — Assessment & Plan Note (Signed)
Severe emphysema, symptomatically quietly progressive. This typical pattern was explained again, as before. Answered questions from him and wife. He favors sending application to [facility] Transplant Coordinator, although I was pretty frank about expectations. Began end of life conversation.

## 2011-02-02 ENCOUNTER — Encounter (HOSPITAL_COMMUNITY)
Admission: RE | Admit: 2011-02-02 | Discharge: 2011-02-02 | Disposition: A | Discharge status: Home / Self Care | Payer: Self-pay | Source: Ambulatory Visit | Attending: Internal Medicine | Admitting: Internal Medicine

## 2011-02-02 DIAGNOSIS — J984 Other disorders of lung: Secondary | ICD-10-CM | POA: Insufficient documentation

## 2011-02-02 DIAGNOSIS — Z5189 Encounter for other specified aftercare: Secondary | ICD-10-CM | POA: Insufficient documentation

## 2011-02-02 DIAGNOSIS — R0602 Shortness of breath: Secondary | ICD-10-CM | POA: Insufficient documentation

## 2011-02-02 DIAGNOSIS — J4489 Other specified chronic obstructive pulmonary disease: Secondary | ICD-10-CM | POA: Insufficient documentation

## 2011-02-02 DIAGNOSIS — J449 Chronic obstructive pulmonary disease, unspecified: Secondary | ICD-10-CM | POA: Insufficient documentation

## 2011-02-03 ENCOUNTER — Other Ambulatory Visit: Payer: Self-pay | Admitting: Internal Medicine

## 2011-02-03 ENCOUNTER — Encounter: Payer: Self-pay | Admitting: Internal Medicine

## 2011-02-03 DIAGNOSIS — J449 Chronic obstructive pulmonary disease, unspecified: Secondary | ICD-10-CM

## 2011-02-03 NOTE — Progress Notes (Signed)
Addendum created in error.

## 2011-02-04 ENCOUNTER — Encounter (HOSPITAL_COMMUNITY): Payer: Self-pay

## 2011-02-08 ENCOUNTER — Telehealth: Payer: Self-pay | Admitting: Internal Medicine

## 2011-02-09 ENCOUNTER — Encounter (HOSPITAL_COMMUNITY): Payer: Self-pay

## 2011-02-11 ENCOUNTER — Encounter (HOSPITAL_COMMUNITY): Payer: Self-pay

## 2011-02-16 ENCOUNTER — Encounter (HOSPITAL_COMMUNITY): Payer: Self-pay | Attending: Internal Medicine

## 2011-02-16 DIAGNOSIS — Z5189 Encounter for other specified aftercare: Secondary | ICD-10-CM | POA: Insufficient documentation

## 2011-02-16 DIAGNOSIS — J984 Other disorders of lung: Secondary | ICD-10-CM | POA: Insufficient documentation

## 2011-02-16 DIAGNOSIS — J4489 Other specified chronic obstructive pulmonary disease: Secondary | ICD-10-CM | POA: Insufficient documentation

## 2011-02-16 DIAGNOSIS — R0602 Shortness of breath: Secondary | ICD-10-CM | POA: Insufficient documentation

## 2011-02-16 DIAGNOSIS — J449 Chronic obstructive pulmonary disease, unspecified: Secondary | ICD-10-CM | POA: Insufficient documentation

## 2011-02-18 ENCOUNTER — Encounter (HOSPITAL_COMMUNITY): Payer: Self-pay

## 2011-02-23 ENCOUNTER — Encounter (HOSPITAL_COMMUNITY): Payer: Medicare Other

## 2011-02-25 ENCOUNTER — Encounter (HOSPITAL_COMMUNITY): Payer: Medicare Other

## 2011-03-02 ENCOUNTER — Encounter (HOSPITAL_COMMUNITY): Payer: Self-pay

## 2011-03-04 ENCOUNTER — Encounter (HOSPITAL_COMMUNITY): Payer: Self-pay

## 2011-03-09 ENCOUNTER — Encounter (HOSPITAL_COMMUNITY): Payer: Self-pay

## 2011-03-10 ENCOUNTER — Telehealth: Payer: Self-pay | Admitting: Internal Medicine

## 2011-03-10 DIAGNOSIS — J449 Chronic obstructive pulmonary disease, unspecified: Secondary | ICD-10-CM

## 2011-03-10 NOTE — Telephone Encounter (Signed)
[id number] 

## 2011-03-10 NOTE — Telephone Encounter (Signed)
Called and spoke with pt. Pt states he is going to [facility] and is requesting recent PET scan and CT scan that were done at [facility] in May 2012.  Instructed pt to call [facility] radiology and ask the radiology dept to put images on disc for him to take to [facility].  Pt verbalized his understanding on this and will call [facility] radiology.    Pt did have 2 other questions for CY: 1) He is contemplating a trip to [place], which will include an airplane ride for 10 to 12 hours or so.  Pt wanted to make sure there are no medical reasons that CY would not think it be ok for him to take this trip.  Please advise.   2) pt states Cy recently sent rx for oxygen at 2 LPM.  Pt states this liter flow is 74 years old and he has been on 3 LPM qhs and during the day with exertion.  Pt is requesting a new rx be sent to [facility] for the correct liter flow of 3 LPM.  Please advise.  Thanks.    Allergies  Allergen Reactions  . Tolectin (Tolmetin Sodium) Other (See Comments)    BP low and passed out  . Montelukast Sodium

## 2011-03-10 NOTE — Telephone Encounter (Signed)
This is a duplicate message.  Will sign off on this message. Please see other phone note dated today 7/25 for complete details.

## 2011-03-11 ENCOUNTER — Encounter (HOSPITAL_COMMUNITY): Payer: Self-pay

## 2011-03-11 ENCOUNTER — Telehealth: Payer: Self-pay | Admitting: Internal Medicine

## 2011-03-11 NOTE — Telephone Encounter (Signed)
This is a duplicate message.  Will sign off. 

## 2011-03-11 NOTE — Telephone Encounter (Signed)
1) PCC Script to [facility]- increase liter flow on O2 to 3 L/M 24/7.   2) Ok trip to [place], but he should talk to [place] and to customer service of the airlines for his trip about oxygen for the trip. He should consider a portable oxygen concentrator.

## 2011-03-11 NOTE — Telephone Encounter (Signed)
  Called and spoke with pt. Pt aware of Cy's recs regarding oxygen liter flow and trip to [place].  Pt verbalized understanding and denied any questions.

## 2011-03-11 NOTE — Telephone Encounter (Signed)
[email or web address].Dimitrius Steedman, CMA  

## 2011-03-16 ENCOUNTER — Encounter (HOSPITAL_COMMUNITY): Payer: Self-pay

## 2011-03-18 ENCOUNTER — Encounter (HOSPITAL_COMMUNITY): Payer: Medicare Other | Attending: Internal Medicine

## 2011-03-18 DIAGNOSIS — R0602 Shortness of breath: Secondary | ICD-10-CM | POA: Insufficient documentation

## 2011-03-18 DIAGNOSIS — Z5189 Encounter for other specified aftercare: Secondary | ICD-10-CM | POA: Insufficient documentation

## 2011-03-18 DIAGNOSIS — J449 Chronic obstructive pulmonary disease, unspecified: Secondary | ICD-10-CM | POA: Insufficient documentation

## 2011-03-18 DIAGNOSIS — J4489 Other specified chronic obstructive pulmonary disease: Secondary | ICD-10-CM | POA: Insufficient documentation

## 2011-03-18 DIAGNOSIS — J984 Other disorders of lung: Secondary | ICD-10-CM | POA: Insufficient documentation

## 2011-03-23 ENCOUNTER — Encounter (HOSPITAL_COMMUNITY): Payer: Self-pay

## 2011-03-25 ENCOUNTER — Encounter (HOSPITAL_COMMUNITY): Payer: Medicare Other

## 2011-03-30 ENCOUNTER — Encounter (HOSPITAL_COMMUNITY): Payer: Self-pay

## 2011-04-01 ENCOUNTER — Encounter (HOSPITAL_COMMUNITY): Payer: Medicare Other

## 2011-04-06 ENCOUNTER — Encounter (HOSPITAL_COMMUNITY): Payer: Medicare Other

## 2011-04-08 ENCOUNTER — Encounter (HOSPITAL_COMMUNITY): Payer: Medicare Other

## 2011-04-13 ENCOUNTER — Encounter (HOSPITAL_COMMUNITY): Payer: Self-pay

## 2011-04-15 ENCOUNTER — Encounter (HOSPITAL_COMMUNITY): Payer: Self-pay

## 2011-04-20 ENCOUNTER — Encounter (HOSPITAL_COMMUNITY): Payer: Self-pay | Attending: Internal Medicine

## 2011-04-20 DIAGNOSIS — Z5189 Encounter for other specified aftercare: Secondary | ICD-10-CM | POA: Insufficient documentation

## 2011-04-20 DIAGNOSIS — J984 Other disorders of lung: Secondary | ICD-10-CM | POA: Insufficient documentation

## 2011-04-20 DIAGNOSIS — R0602 Shortness of breath: Secondary | ICD-10-CM | POA: Insufficient documentation

## 2011-04-20 DIAGNOSIS — J4489 Other specified chronic obstructive pulmonary disease: Secondary | ICD-10-CM | POA: Insufficient documentation

## 2011-04-20 DIAGNOSIS — J449 Chronic obstructive pulmonary disease, unspecified: Secondary | ICD-10-CM | POA: Insufficient documentation

## 2011-04-22 ENCOUNTER — Encounter (HOSPITAL_COMMUNITY): Payer: Self-pay

## 2011-04-27 ENCOUNTER — Encounter (HOSPITAL_COMMUNITY): Payer: Self-pay

## 2011-04-27 LAB — PULMONARY FUNCTION TEST

## 2011-04-29 ENCOUNTER — Encounter (HOSPITAL_COMMUNITY): Payer: Self-pay

## 2011-05-04 ENCOUNTER — Encounter (HOSPITAL_COMMUNITY): Payer: Self-pay

## 2011-05-06 ENCOUNTER — Encounter (HOSPITAL_COMMUNITY): Payer: Self-pay

## 2011-05-11 ENCOUNTER — Encounter (HOSPITAL_COMMUNITY): Payer: Self-pay

## 2011-05-13 ENCOUNTER — Encounter (HOSPITAL_COMMUNITY): Payer: Self-pay

## 2011-05-18 ENCOUNTER — Encounter (HOSPITAL_COMMUNITY): Payer: Self-pay | Attending: Internal Medicine

## 2011-05-18 DIAGNOSIS — J449 Chronic obstructive pulmonary disease, unspecified: Secondary | ICD-10-CM | POA: Insufficient documentation

## 2011-05-18 DIAGNOSIS — J4489 Other specified chronic obstructive pulmonary disease: Secondary | ICD-10-CM | POA: Insufficient documentation

## 2011-05-18 DIAGNOSIS — J984 Other disorders of lung: Secondary | ICD-10-CM | POA: Insufficient documentation

## 2011-05-18 DIAGNOSIS — Z5189 Encounter for other specified aftercare: Secondary | ICD-10-CM | POA: Insufficient documentation

## 2011-05-18 DIAGNOSIS — R0602 Shortness of breath: Secondary | ICD-10-CM | POA: Insufficient documentation

## 2011-05-20 ENCOUNTER — Ambulatory Visit (INDEPENDENT_AMBULATORY_CARE_PROVIDER_SITE_OTHER): Payer: Medicare Other | Admitting: Internal Medicine

## 2011-05-20 ENCOUNTER — Encounter: Payer: Self-pay | Admitting: Internal Medicine

## 2011-05-20 VITALS — BP 152/74 | HR 78 | Ht 70.0 in | Wt 192.0 lb

## 2011-05-20 DIAGNOSIS — J4489 Other specified chronic obstructive pulmonary disease: Secondary | ICD-10-CM

## 2011-05-20 DIAGNOSIS — I2510 Atherosclerotic heart disease of native coronary artery without angina pectoris: Secondary | ICD-10-CM

## 2011-05-20 DIAGNOSIS — J449 Chronic obstructive pulmonary disease, unspecified: Secondary | ICD-10-CM

## 2011-05-20 DIAGNOSIS — J984 Other disorders of lung: Secondary | ICD-10-CM

## 2011-05-20 NOTE — Patient Instructions (Signed)
Please contact me as needed

## 2011-05-20 NOTE — Progress Notes (Signed)
Subjective:    Patient ID: [name], male    DOB: 1935-05-08, 76 y.o.   MRN: [id number] PCP Dr Catha Gosselin HPI 01/25/11- 71 yo M former smoker followed for COPD/ emphysema with recent concern for pulmonary nodules. Last here May 20, 2010- note reviewed. CT done during the winter showed nodules.  PET positive nodule Left lung faded before needle bx. He had gone to [facility] for second opinion w/ Dr Otto Herb where severity of his advanced lung disease was emphasized.  lung volume reduction and transplant were mentioned although I don't know how seriously, given his age.[name] He comes today to update and to have conference including his wife:  Conference on overall status: He had concern about amount of radiation from imaging and I reminded him that is why we chose to do CXR 05/20/10 which showed stable COPD. PFT 07/01/05 - FEV1 1.13/ 0.39; FEV1/FVC 0.25; DLCO 0.52; insignif response to bronchodilator. PFT at [facility] 11/26/10- FEV1 0.82/ 0.25; FEV1/FVC 0.22 Quality of life and prognosis discussed. He has noted some decline over past year. He could walk through [employer] without O2 then and no longer can. Emphysema pattern with little cough, wheeze, phlegm. No chest pain, palpitation or ankle edema. After long discussion we agreed to an inquiry to [facility] Transplant coordinator for consideration. I told him his age made acceptance unlikely. We discussed limited role of meds,He has had Pneumovax, annual flu vax, Pulmonary Rehab, and test normal for a1AT in the past. Has O2 2l/m for sleep and exertion, home nebulizer. He asks another script for prednisone to hold and we discussed side effects.   05/20/11 76 yo M former smoker followed for COPD/ emphysema with recent concern for pulmonary nodules. .. wife here He and his wife come today for an extended review of his early experience with [facility] lung transplant team evaluation. PFT- [facility] 04/27/11- FEV1 0.78/23%, FEV1/FVC 0.27, TLC 98%, RV 163%, DLCO 37%. ABG-room  air-04/27/2011 pH 7.42, PCO2 40, PO2 63, bicarbonate 26, O2 saturation 91.9% 6 minute walk test 04/27/2011: 314.6 m (60% to distance predicted) with significant dyspnea while wearing oxygen at 3 L. He came away with significant reservations about the prospects for transplant. He asked for my estimate of his life expectancy without transplant. I suggested that barring an intervening acute illness, an additional 5 years is possible. He understands this is a very loose estimate. I deferred questions about hepatitis B vaccination and testosterone replacement to his primary physician. He wanted to know what else might do to test and treat him if he chose not to seek transplant. We will watch his lung nodules with chest x-ray and/or CT, but our ability to intervene would be limited. If he does not have transplant, I don't know that cardiac stress test or repeat pulmonary function tests would add much useful in information unless symptoms change.  Review of Systems Constitutional:   No-   weight loss, night sweats, fevers, chills, fatigue, lassitude. HEENT:   No-  headaches, difficulty swallowing, tooth/dental problems, sore throat,       No-  sneezing, itching, ear ache, nasal congestion, post nasal drip,  CV:  No-   chest pain, orthopnea, PND, swelling in lower extremities, anasarca, dizziness, palpitations Resp: + shortness of breath with exertion, not at rest.              No-   productive cough,  very little non-productive cough,  No-  coughing up of blood.  No-   change in color of mucus.  No- wheezing.   Skin: No-   rash or lesions. GI:  No-   heartburn, indigestion, abdominal pain, nausea, vomiting, diarrhea,                 change in bowel habits, loss of appetite GU: No-   dysuria, change in color of urine, no urgency or frequency.  No- flank pain. MS:  No-   joint pain or swelling.  No- decreased range of motion.  No- back pain. Neuro-  Psych:  No- change in mood or affect. No acute  depression or anxiety.  No memory loss.   OBJ- General- Alert, Oriented, Affect-appropriate, Distress- none acute. He brought his oxygen but sat without wearing it. (91% at rest) Skin- rash-none, lesions- none, excoriation- none Lymphadenopathy- none Head- atraumatic            Eyes- Gross vision intact, PERRLA, conjunctivae clear secretions            Ears- Hearing, canals-normal            Nose- Clear, no-Septal dev, mucus, polyps, erosion, perforation             Throat- Mallampati II , mucosa clear , drainage- none, tonsils- atrophic Neck- flexible , trachea midline, no stridor , thyroid nl, carotid no bruit Chest - symmetrical excursion , unlabored           Heart/CV- RRR , no murmur , no gallop  , no rub, nl s1 s2                           - JVD- none , edema- none, stasis changes- none, varices- none           Lung- clear to P&A- very distant, unlabored, wheeze- none, cough- none , dullness-none, rub- none           Chest wall-  Abd- tender-no, distended-no, bowel sounds-present, HSM- no Br/ Gen/ Rectal- Not done, not indicated Extrem- cyanosis- none, clubbing, none, atrophy- none, strength- nl Neuro- grossly intact to observation         Assessment & Plan:

## 2011-05-20 NOTE — Progress Notes (Signed)
  Subjective:    Patient ID: [name], male    DOB: 13-Oct-1934, 76 y.o.   MRN: [id number]  HPI    Review of Systems     Objective:   Physical Exam  SATURATION QUALIFICATIONS:  Patient Saturations on Room Air while Ambulating = 85%  Patient Saturations on 4 Liters of oxygen while Ambulating = 91%Tristy Udovich,CMA        Assessment & Plan:

## 2011-05-23 DIAGNOSIS — I2510 Atherosclerotic heart disease of native coronary artery without angina pectoris: Secondary | ICD-10-CM | POA: Insufficient documentation

## 2011-05-23 NOTE — Assessment & Plan Note (Signed)
CT demonstrated radiographically stable emphysema, and also showed atherosclerotic vascular calcification.

## 2011-05-23 NOTE — Assessment & Plan Note (Signed)
Resolving nodule is likely inflammatory. An actively growing lesion would need to be carefully considered.

## 2011-05-23 NOTE — Assessment & Plan Note (Signed)
He has not been symptomatic with angina, palpitations or fluid retention. [facility] transplant team anticipated a cardiac stress test if his transplant consideration goes forward.

## 2011-05-25 ENCOUNTER — Encounter (HOSPITAL_COMMUNITY): Payer: Self-pay

## 2011-05-27 ENCOUNTER — Encounter (HOSPITAL_COMMUNITY): Payer: Self-pay

## 2011-06-01 ENCOUNTER — Encounter (HOSPITAL_COMMUNITY): Payer: Self-pay

## 2011-06-03 ENCOUNTER — Encounter (HOSPITAL_COMMUNITY): Payer: Self-pay

## 2011-06-08 ENCOUNTER — Encounter (HOSPITAL_COMMUNITY): Payer: Self-pay

## 2011-06-10 ENCOUNTER — Encounter (HOSPITAL_COMMUNITY): Payer: Self-pay

## 2011-06-15 ENCOUNTER — Encounter (HOSPITAL_COMMUNITY): Payer: Self-pay

## 2011-06-17 ENCOUNTER — Encounter (HOSPITAL_COMMUNITY): Payer: Self-pay

## 2011-06-17 DIAGNOSIS — Z5189 Encounter for other specified aftercare: Secondary | ICD-10-CM | POA: Insufficient documentation

## 2011-06-17 DIAGNOSIS — R0602 Shortness of breath: Secondary | ICD-10-CM | POA: Insufficient documentation

## 2011-06-17 DIAGNOSIS — J4489 Other specified chronic obstructive pulmonary disease: Secondary | ICD-10-CM | POA: Insufficient documentation

## 2011-06-17 DIAGNOSIS — J449 Chronic obstructive pulmonary disease, unspecified: Secondary | ICD-10-CM | POA: Insufficient documentation

## 2011-06-17 DIAGNOSIS — J984 Other disorders of lung: Secondary | ICD-10-CM | POA: Insufficient documentation

## 2011-06-21 NOTE — Telephone Encounter (Signed)
Note closed for completion of doumentation

## 2011-06-22 ENCOUNTER — Encounter (HOSPITAL_COMMUNITY): Payer: Self-pay

## 2011-06-24 ENCOUNTER — Encounter (HOSPITAL_COMMUNITY): Payer: Self-pay

## 2011-06-29 ENCOUNTER — Encounter (HOSPITAL_COMMUNITY): Payer: Self-pay

## 2011-07-01 ENCOUNTER — Encounter (HOSPITAL_COMMUNITY)
Admission: RE | Admit: 2011-07-01 | Discharge: 2011-07-01 | Disposition: A | Discharge status: Home / Self Care | Payer: Self-pay | Source: Ambulatory Visit | Attending: Internal Medicine | Admitting: Internal Medicine

## 2011-07-06 ENCOUNTER — Encounter (HOSPITAL_COMMUNITY)
Admission: RE | Admit: 2011-07-06 | Discharge: 2011-07-06 | Disposition: A | Discharge status: Home / Self Care | Payer: Self-pay | Source: Ambulatory Visit | Attending: Internal Medicine | Admitting: Internal Medicine

## 2011-07-08 ENCOUNTER — Encounter (HOSPITAL_COMMUNITY): Payer: Self-pay

## 2011-07-13 ENCOUNTER — Encounter (HOSPITAL_COMMUNITY)
Admission: RE | Admit: 2011-07-13 | Discharge: 2011-07-13 | Disposition: A | Discharge status: Home / Self Care | Payer: Self-pay | Source: Ambulatory Visit | Attending: Internal Medicine | Admitting: Internal Medicine

## 2011-07-15 ENCOUNTER — Encounter (HOSPITAL_COMMUNITY)
Admission: RE | Admit: 2011-07-15 | Discharge: 2011-07-15 | Disposition: A | Discharge status: Home / Self Care | Payer: Self-pay | Source: Ambulatory Visit | Attending: Internal Medicine | Admitting: Internal Medicine

## 2011-07-20 ENCOUNTER — Encounter (HOSPITAL_COMMUNITY): Payer: Self-pay

## 2011-07-20 DIAGNOSIS — J449 Chronic obstructive pulmonary disease, unspecified: Secondary | ICD-10-CM | POA: Insufficient documentation

## 2011-07-20 DIAGNOSIS — Z5189 Encounter for other specified aftercare: Secondary | ICD-10-CM | POA: Insufficient documentation

## 2011-07-20 DIAGNOSIS — J4489 Other specified chronic obstructive pulmonary disease: Secondary | ICD-10-CM | POA: Insufficient documentation

## 2011-07-20 DIAGNOSIS — J984 Other disorders of lung: Secondary | ICD-10-CM | POA: Insufficient documentation

## 2011-07-20 DIAGNOSIS — R0602 Shortness of breath: Secondary | ICD-10-CM | POA: Insufficient documentation

## 2011-07-27 ENCOUNTER — Encounter (HOSPITAL_COMMUNITY)
Admission: RE | Admit: 2011-07-27 | Discharge: 2011-07-27 | Disposition: A | Discharge status: Home / Self Care | Payer: Self-pay | Source: Ambulatory Visit | Attending: Internal Medicine | Admitting: Internal Medicine

## 2011-07-29 ENCOUNTER — Encounter (HOSPITAL_COMMUNITY)
Admission: RE | Admit: 2011-07-29 | Discharge: 2011-07-29 | Disposition: A | Discharge status: Home / Self Care | Payer: Self-pay | Source: Ambulatory Visit | Attending: Internal Medicine | Admitting: Internal Medicine

## 2011-08-03 ENCOUNTER — Encounter (HOSPITAL_COMMUNITY)
Admission: RE | Admit: 2011-08-03 | Discharge: 2011-08-03 | Disposition: A | Discharge status: Home / Self Care | Payer: Self-pay | Source: Ambulatory Visit | Attending: Internal Medicine | Admitting: Internal Medicine

## 2011-08-05 ENCOUNTER — Encounter (HOSPITAL_COMMUNITY)
Admission: RE | Admit: 2011-08-05 | Discharge: 2011-08-05 | Disposition: A | Discharge status: Home / Self Care | Payer: Self-pay | Source: Ambulatory Visit | Attending: Internal Medicine | Admitting: Internal Medicine

## 2011-08-10 ENCOUNTER — Encounter (HOSPITAL_COMMUNITY): Payer: Self-pay

## 2011-08-12 ENCOUNTER — Encounter (HOSPITAL_COMMUNITY): Payer: Self-pay

## 2011-08-17 ENCOUNTER — Encounter (HOSPITAL_COMMUNITY): Payer: Medicare Other | Attending: Internal Medicine

## 2011-08-17 DIAGNOSIS — Z5189 Encounter for other specified aftercare: Secondary | ICD-10-CM | POA: Insufficient documentation

## 2011-08-17 DIAGNOSIS — R0602 Shortness of breath: Secondary | ICD-10-CM | POA: Insufficient documentation

## 2011-08-17 DIAGNOSIS — J449 Chronic obstructive pulmonary disease, unspecified: Secondary | ICD-10-CM | POA: Insufficient documentation

## 2011-08-17 DIAGNOSIS — J984 Other disorders of lung: Secondary | ICD-10-CM | POA: Insufficient documentation

## 2011-08-17 DIAGNOSIS — J4489 Other specified chronic obstructive pulmonary disease: Secondary | ICD-10-CM | POA: Insufficient documentation

## 2011-08-19 ENCOUNTER — Encounter (HOSPITAL_COMMUNITY): Payer: Medicare Other

## 2011-08-24 ENCOUNTER — Encounter (HOSPITAL_COMMUNITY)
Admission: RE | Admit: 2011-08-24 | Payer: Medicare Other | Source: Ambulatory Visit | Attending: Internal Medicine | Admitting: Internal Medicine

## 2011-08-26 ENCOUNTER — Encounter (HOSPITAL_COMMUNITY): Payer: Medicare Other

## 2011-08-31 ENCOUNTER — Encounter (HOSPITAL_COMMUNITY): Payer: Medicare Other

## 2011-09-02 ENCOUNTER — Encounter (HOSPITAL_COMMUNITY): Payer: Medicare Other

## 2011-09-07 ENCOUNTER — Encounter (HOSPITAL_COMMUNITY): Payer: Medicare Other

## 2011-09-09 ENCOUNTER — Encounter (HOSPITAL_COMMUNITY): Payer: Medicare Other

## 2011-09-14 ENCOUNTER — Encounter (HOSPITAL_COMMUNITY): Payer: Medicare Other

## 2011-09-16 ENCOUNTER — Encounter (HOSPITAL_COMMUNITY): Payer: Medicare Other

## 2011-09-20 ENCOUNTER — Encounter: Payer: Self-pay | Admitting: Cardiovascular Disease

## 2011-09-20 DIAGNOSIS — R7301 Impaired fasting glucose: Secondary | ICD-10-CM | POA: Diagnosis not present

## 2011-09-20 DIAGNOSIS — I10 Essential (primary) hypertension: Secondary | ICD-10-CM | POA: Diagnosis not present

## 2011-09-20 DIAGNOSIS — E7800 Pure hypercholesterolemia, unspecified: Secondary | ICD-10-CM | POA: Diagnosis not present

## 2011-09-20 DIAGNOSIS — I471 Supraventricular tachycardia: Secondary | ICD-10-CM | POA: Diagnosis not present

## 2011-09-20 DIAGNOSIS — J449 Chronic obstructive pulmonary disease, unspecified: Secondary | ICD-10-CM | POA: Diagnosis not present

## 2011-09-21 ENCOUNTER — Encounter (HOSPITAL_COMMUNITY)
Admission: RE | Admit: 2011-09-21 | Discharge: 2011-09-21 | Disposition: A | Discharge status: Home / Self Care | Payer: Self-pay | Source: Ambulatory Visit | Attending: Internal Medicine | Admitting: Internal Medicine

## 2011-09-21 DIAGNOSIS — J4489 Other specified chronic obstructive pulmonary disease: Secondary | ICD-10-CM | POA: Insufficient documentation

## 2011-09-21 DIAGNOSIS — J449 Chronic obstructive pulmonary disease, unspecified: Secondary | ICD-10-CM | POA: Insufficient documentation

## 2011-09-21 DIAGNOSIS — Z5189 Encounter for other specified aftercare: Secondary | ICD-10-CM | POA: Insufficient documentation

## 2011-09-21 DIAGNOSIS — R0602 Shortness of breath: Secondary | ICD-10-CM | POA: Insufficient documentation

## 2011-09-21 DIAGNOSIS — J984 Other disorders of lung: Secondary | ICD-10-CM | POA: Insufficient documentation

## 2011-09-21 NOTE — Progress Notes (Signed)
1st day back to pulmonary rehab after taking month of vacation.  Tolerated exercise well.  Vital signs stable.

## 2011-09-23 ENCOUNTER — Encounter (HOSPITAL_COMMUNITY)
Admission: RE | Admit: 2011-09-23 | Payer: Self-pay | Source: Ambulatory Visit | Attending: Internal Medicine | Admitting: Internal Medicine

## 2011-09-28 ENCOUNTER — Encounter (HOSPITAL_COMMUNITY)
Admission: RE | Admit: 2011-09-28 | Discharge: 2011-09-28 | Disposition: A | Discharge status: Home / Self Care | Payer: Self-pay | Source: Ambulatory Visit | Attending: Internal Medicine | Admitting: Internal Medicine

## 2011-09-30 ENCOUNTER — Encounter (HOSPITAL_COMMUNITY)
Admission: RE | Admit: 2011-09-30 | Discharge: 2011-09-30 | Disposition: A | Discharge status: Home / Self Care | Payer: Self-pay | Source: Ambulatory Visit | Attending: Internal Medicine | Admitting: Internal Medicine

## 2011-10-01 DIAGNOSIS — Z79899 Other long term (current) drug therapy: Secondary | ICD-10-CM | POA: Diagnosis not present

## 2011-10-05 ENCOUNTER — Encounter (HOSPITAL_COMMUNITY)
Admission: RE | Admit: 2011-10-05 | Discharge: 2011-10-05 | Disposition: A | Discharge status: Home / Self Care | Payer: Self-pay | Source: Ambulatory Visit | Attending: Internal Medicine | Admitting: Internal Medicine

## 2011-10-07 ENCOUNTER — Encounter (HOSPITAL_COMMUNITY)
Admission: RE | Admit: 2011-10-07 | Discharge: 2011-10-07 | Disposition: A | Discharge status: Home / Self Care | Payer: Self-pay | Source: Ambulatory Visit | Attending: Internal Medicine | Admitting: Internal Medicine

## 2011-10-12 ENCOUNTER — Encounter (HOSPITAL_COMMUNITY)
Admission: RE | Admit: 2011-10-12 | Discharge: 2011-10-12 | Disposition: A | Discharge status: Home / Self Care | Payer: Self-pay | Source: Ambulatory Visit | Attending: Internal Medicine | Admitting: Internal Medicine

## 2011-10-14 ENCOUNTER — Encounter (HOSPITAL_COMMUNITY)
Admission: RE | Admit: 2011-10-14 | Discharge: 2011-10-14 | Disposition: A | Discharge status: Home / Self Care | Payer: Self-pay | Source: Ambulatory Visit | Attending: Internal Medicine | Admitting: Internal Medicine

## 2011-10-19 ENCOUNTER — Encounter (HOSPITAL_COMMUNITY)
Admission: RE | Admit: 2011-10-19 | Discharge: 2011-10-19 | Disposition: A | Discharge status: Home / Self Care | Payer: Self-pay | Source: Ambulatory Visit | Attending: Internal Medicine | Admitting: Internal Medicine

## 2011-10-19 DIAGNOSIS — J984 Other disorders of lung: Secondary | ICD-10-CM | POA: Insufficient documentation

## 2011-10-19 DIAGNOSIS — J449 Chronic obstructive pulmonary disease, unspecified: Secondary | ICD-10-CM | POA: Insufficient documentation

## 2011-10-19 DIAGNOSIS — J4489 Other specified chronic obstructive pulmonary disease: Secondary | ICD-10-CM | POA: Insufficient documentation

## 2011-10-19 DIAGNOSIS — R0602 Shortness of breath: Secondary | ICD-10-CM | POA: Insufficient documentation

## 2011-10-19 DIAGNOSIS — Z5189 Encounter for other specified aftercare: Secondary | ICD-10-CM | POA: Insufficient documentation

## 2011-10-20 ENCOUNTER — Ambulatory Visit (INDEPENDENT_AMBULATORY_CARE_PROVIDER_SITE_OTHER)
Admission: RE | Admit: 2011-10-20 | Discharge: 2011-10-20 | Disposition: A | Discharge status: Home / Self Care | Payer: Medicare Other | Source: Ambulatory Visit | Attending: Internal Medicine | Admitting: Internal Medicine

## 2011-10-20 ENCOUNTER — Ambulatory Visit (INDEPENDENT_AMBULATORY_CARE_PROVIDER_SITE_OTHER): Payer: Medicare Other | Admitting: Internal Medicine

## 2011-10-20 ENCOUNTER — Encounter: Payer: Self-pay | Admitting: Internal Medicine

## 2011-10-20 VITALS — BP 152/76 | HR 84 | Ht 70.0 in | Wt 192.2 lb

## 2011-10-20 DIAGNOSIS — J984 Other disorders of lung: Secondary | ICD-10-CM

## 2011-10-20 DIAGNOSIS — I2510 Atherosclerotic heart disease of native coronary artery without angina pectoris: Secondary | ICD-10-CM | POA: Diagnosis not present

## 2011-10-20 DIAGNOSIS — R911 Solitary pulmonary nodule: Secondary | ICD-10-CM | POA: Diagnosis not present

## 2011-10-20 DIAGNOSIS — J449 Chronic obstructive pulmonary disease, unspecified: Secondary | ICD-10-CM

## 2011-10-20 NOTE — Progress Notes (Signed)
Patient ID: [name], male    DOB: [birth year], 77 y.o.   MRN: [id number] PCP Dr Catha Gosselin HPI 01/25/11- 22 yo M former smoker followed for COPD/ emphysema with recent concern for pulmonary nodules. Last here May 20, 2010- note reviewed. CT done during the winter showed nodules.  PET positive nodule Left lung faded before needle bx. He had gone to [facility] for second opinion w/ Dr Otto Herb where severity of his advanced lung disease was emphasized.  lung volume reduction and transplant were mentioned although I don't know how seriously, given his age.[name] He comes today to update and to have conference including his wife:  Conference on overall status: He had concern about amount of radiation from imaging and I reminded him that is why we chose to do CXR 05/20/10 which showed stable COPD. PFT 07/01/05 - FEV1 1.13/ 0.39; FEV1/FVC 0.25; DLCO 0.52; insignif response to bronchodilator. PFT at [facility] 11/26/10- FEV1 0.82/ 0.25; FEV1/FVC 0.22 Quality of life and prognosis discussed. He has noted some decline over past year. He could walk through [employer] without O2 then and no longer can. Emphysema pattern with little cough, wheeze, phlegm. No chest pain, palpitation or ankle edema. After long discussion we agreed to an inquiry to [facility] Transplant coordinator for consideration. I told him his age made acceptance unlikely. We discussed limited role of meds,He has had Pneumovax, annual flu vax, Pulmonary Rehab, and test normal for a1AT in the past. Has O2 2l/m for sleep and exertion, home nebulizer. He asks another script for prednisone to hold and we discussed side effects.   05/20/11 77 yo M former smoker followed for COPD/ emphysema with recent concern for pulmonary nodules. .. wife here He and his wife come today for an extended review of his early experience with [facility] lung transplant team evaluation. PFT- [facility] 04/27/11- FEV1 0.78/23%, FEV1/FVC 0.27, TLC 98%, RV 163%, DLCO 37%. ABG-room air-04/27/2011  pH 7.42, PCO2 40, PO2 63, bicarbonate 26, O2 saturation 91.9% 6 minute walk test 04/27/2011: 314.6 m (60% to distance predicted) with significant dyspnea while wearing oxygen at 3 L. He came away with significant reservations about the prospects for transplant. He asked for my estimate of his life expectancy without transplant. I suggested that barring an intervening acute illness, an additional 5 years is possible. He understands this is a very loose estimate. I deferred questions about hepatitis B vaccination and testosterone replacement to his primary physician. He wanted to know what else we might do to test and treat him if he chose not to seek transplant. We will watch his lung nodules with chest x-ray and/or CT, but our ability to intervene would be limited. If he does not have transplant, I don't know that cardiac stress test or repeat pulmonary function tests would add much useful  information unless symptoms change.  10/20/11- 77 yo M former smoker followed for COPD/ emphysema with recent concern for pulmonary nodules [facility] CXR 04/27/11- COPD with no mention of nodules. [facility] until September. They want stress test. He is leaning away from transplant at this time of blood and he wanted my thoughts. Discussed his left upper lobe nodule, atherosclerosis on CT scan in April of 2012. He continues pulmonary rehabilitation. Oxygen saturation on 3 L is 96% during exercise. He feels his current life quality is okay. Denies chest pain, acute discomfort, edema. Little cough or phlegm.  Review of Systems-see HPI Constitutional:   No-   weight loss,  night sweats, fevers, chills, fatigue, lassitude. HEENT:   No-  headaches, difficulty swallowing, tooth/dental problems, sore throat,       No-  sneezing, itching, ear ache, nasal congestion, post nasal drip,  CV:  No-   chest pain, orthopnea, PND, swelling in lower extremities, anasarca, dizziness, palpitations Resp: +  shortness of breath with exertion, not at rest.              No-   productive cough,  very little non-productive cough,  No-  coughing up of blood.              No-   change in color of mucus.  No- wheezing.   Skin: No-   rash or lesions. GI:  No-   heartburn, indigestion, abdominal pain, nausea,   GU: No-   dysuria,  MS:  No-   joint pain or swelling.  No- decreased range of motion.  No- back pain. Neuro-  Psych:  No- change in mood or affect. No acute depression or anxiety.  No memory loss.   OBJ- General- Alert, Oriented, Affect-appropriate, Distress- none acute. He brought his oxygen but sat without wearing it. (91% at rest) Skin- rash-none, lesions- none, excoriation- none Lymphadenopathy- none Head- atraumatic            Eyes- Gross vision intact, PERRLA, conjunctivae clear secretions            Ears- Hearing, canals-normal            Nose- Clear, no-Septal dev, mucus, polyps, erosion, perforation             Throat- Mallampati II , mucosa clear , drainage- none, tonsils- atrophic Neck- flexible , trachea midline, no stridor , thyroid nl, carotid no bruit Chest - symmetrical excursion , unlabored           Heart/CV- RRR , no murmur , no gallop  , no rub, nl s1 s2                           - JVD- none , edema- trace, stasis changes- none, varices- none           Lung- clear to P&A- very distant, slow expiratory phase, unlabored, wheeze- none, cough- none , dullness-none, rub- none           Chest wall-  Abd-  Br/ Gen/ Rectal- Not done, not indicated Extrem- cyanosis- none, clubbing, none, atrophy- none, strength- nl Neuro- grossly intact to observation

## 2011-10-20 NOTE — Patient Instructions (Signed)
Order- referral to  Cardiology-  ASCVD for risk stratification  Order- CXR   Sample - [name]- 1 puff twice daily- try this for comparison instead of Spiriva

## 2011-10-21 ENCOUNTER — Encounter (HOSPITAL_COMMUNITY)
Admission: RE | Admit: 2011-10-21 | Discharge: 2011-10-21 | Disposition: A | Discharge status: Home / Self Care | Payer: Self-pay | Source: Ambulatory Visit | Attending: Internal Medicine | Admitting: Internal Medicine

## 2011-10-24 ENCOUNTER — Encounter: Payer: Self-pay | Admitting: Internal Medicine

## 2011-10-24 NOTE — Assessment & Plan Note (Signed)
Far advanced emphysema. We anticipate gradual worsening typical for disease progression. He is comfortable now and adjusted life with portable oxygen. I suggested it would be perfectly reasonable if he decided against lung transplant. Meanwhile, since he has the contact, I suggested he keep the appointment with them in September unless he is completely satisfied that he has made his decision.

## 2011-10-24 NOTE — Assessment & Plan Note (Signed)
Asymptomatic atherosclerosis noted on CT scan in April of 2012. The [facility] transplant program wanted him to have a stress test in September. He would like to get here with a local cardiologist. Plan cardiology referral for risk stratification.

## 2011-10-24 NOTE — Assessment & Plan Note (Signed)
As long as we don't see a growing lesion on chest x-ray, we can save the radiation and cost of repetitive chest CT. He was comfortable with this strategy.

## 2011-10-25 ENCOUNTER — Encounter (HOSPITAL_COMMUNITY)
Admission: RE | Admit: 2011-10-25 | Discharge: 2011-10-25 | Disposition: A | Discharge status: Home / Self Care | Payer: Self-pay | Source: Ambulatory Visit | Attending: Internal Medicine | Admitting: Internal Medicine

## 2011-10-25 ENCOUNTER — Telehealth: Payer: Self-pay | Admitting: Internal Medicine

## 2011-10-25 NOTE — Telephone Encounter (Signed)
Pt contacted me for advice re potential trip to [place] for family wedding.  I pointed out he would need to pace himself and avoid fatigue. He would need to discuss portable concentrator with [employer], learn local line voltage issues and verify w/ manufacturer that proposed machine could be interfaced.  I reported his latest CXR result.

## 2011-10-26 ENCOUNTER — Encounter (HOSPITAL_COMMUNITY): Payer: Self-pay

## 2011-10-28 ENCOUNTER — Encounter (HOSPITAL_COMMUNITY): Payer: Self-pay

## 2011-10-28 DIAGNOSIS — L821 Other seborrheic keratosis: Secondary | ICD-10-CM | POA: Diagnosis not present

## 2011-10-28 DIAGNOSIS — Z85828 Personal history of other malignant neoplasm of skin: Secondary | ICD-10-CM | POA: Diagnosis not present

## 2011-10-28 DIAGNOSIS — L570 Actinic keratosis: Secondary | ICD-10-CM | POA: Diagnosis not present

## 2011-10-28 DIAGNOSIS — D239 Other benign neoplasm of skin, unspecified: Secondary | ICD-10-CM | POA: Diagnosis not present

## 2011-10-28 DIAGNOSIS — L219 Seborrheic dermatitis, unspecified: Secondary | ICD-10-CM | POA: Diagnosis not present

## 2011-10-29 NOTE — Progress Notes (Signed)
Quick Note:  Pt aware of results. ______ 

## 2011-11-02 ENCOUNTER — Encounter (HOSPITAL_COMMUNITY)
Admission: RE | Admit: 2011-11-02 | Discharge: 2011-11-02 | Disposition: A | Discharge status: Home / Self Care | Payer: Self-pay | Source: Ambulatory Visit | Attending: Internal Medicine | Admitting: Internal Medicine

## 2011-11-04 ENCOUNTER — Encounter (HOSPITAL_COMMUNITY): Payer: Self-pay

## 2011-11-09 ENCOUNTER — Encounter (HOSPITAL_COMMUNITY): Payer: Self-pay

## 2011-11-11 ENCOUNTER — Encounter (HOSPITAL_COMMUNITY): Payer: Self-pay

## 2011-11-15 ENCOUNTER — Ambulatory Visit: Payer: Medicare Other | Admitting: Cardiovascular Disease

## 2011-11-16 ENCOUNTER — Encounter (HOSPITAL_COMMUNITY): Payer: Self-pay

## 2011-11-16 DIAGNOSIS — J4489 Other specified chronic obstructive pulmonary disease: Secondary | ICD-10-CM | POA: Insufficient documentation

## 2011-11-16 DIAGNOSIS — J449 Chronic obstructive pulmonary disease, unspecified: Secondary | ICD-10-CM | POA: Insufficient documentation

## 2011-11-16 DIAGNOSIS — Z5189 Encounter for other specified aftercare: Secondary | ICD-10-CM | POA: Insufficient documentation

## 2011-11-16 DIAGNOSIS — J984 Other disorders of lung: Secondary | ICD-10-CM | POA: Insufficient documentation

## 2011-11-16 DIAGNOSIS — R0602 Shortness of breath: Secondary | ICD-10-CM | POA: Insufficient documentation

## 2011-11-18 ENCOUNTER — Ambulatory Visit: Payer: Medicare Other | Admitting: Internal Medicine

## 2011-11-18 ENCOUNTER — Encounter (HOSPITAL_COMMUNITY)
Admission: RE | Admit: 2011-11-18 | Discharge: 2011-11-18 | Disposition: A | Discharge status: Home / Self Care | Payer: Self-pay | Source: Ambulatory Visit | Attending: Internal Medicine | Admitting: Internal Medicine

## 2011-11-19 DIAGNOSIS — N400 Enlarged prostate without lower urinary tract symptoms: Secondary | ICD-10-CM | POA: Diagnosis not present

## 2011-11-19 DIAGNOSIS — R972 Elevated prostate specific antigen [PSA]: Secondary | ICD-10-CM | POA: Diagnosis not present

## 2011-11-19 DIAGNOSIS — R351 Nocturia: Secondary | ICD-10-CM | POA: Diagnosis not present

## 2011-11-22 ENCOUNTER — Encounter: Payer: Self-pay | Admitting: Cardiology

## 2011-11-23 ENCOUNTER — Encounter (HOSPITAL_COMMUNITY)
Admission: RE | Admit: 2011-11-23 | Discharge: 2011-11-23 | Disposition: A | Discharge status: Home / Self Care | Payer: Self-pay | Source: Ambulatory Visit | Attending: Internal Medicine | Admitting: Internal Medicine

## 2011-11-23 ENCOUNTER — Ambulatory Visit: Payer: Medicare Other | Admitting: Cardiovascular Disease

## 2011-11-25 ENCOUNTER — Encounter (HOSPITAL_COMMUNITY): Payer: Self-pay

## 2011-11-26 ENCOUNTER — Telehealth: Payer: Self-pay | Admitting: Internal Medicine

## 2011-11-26 ENCOUNTER — Ambulatory Visit (INDEPENDENT_AMBULATORY_CARE_PROVIDER_SITE_OTHER): Payer: Medicare Other | Admitting: Cardiovascular Disease

## 2011-11-26 ENCOUNTER — Encounter: Payer: Self-pay | Admitting: Cardiovascular Disease

## 2011-11-26 VITALS — BP 126/70 | HR 80 | Ht 70.0 in | Wt 190.0 lb

## 2011-11-26 DIAGNOSIS — R0602 Shortness of breath: Secondary | ICD-10-CM

## 2011-11-26 DIAGNOSIS — J449 Chronic obstructive pulmonary disease, unspecified: Secondary | ICD-10-CM | POA: Diagnosis not present

## 2011-11-26 DIAGNOSIS — I10 Essential (primary) hypertension: Secondary | ICD-10-CM

## 2011-11-26 DIAGNOSIS — I2510 Atherosclerotic heart disease of native coronary artery without angina pectoris: Secondary | ICD-10-CM

## 2011-11-26 DIAGNOSIS — E785 Hyperlipidemia, unspecified: Secondary | ICD-10-CM

## 2011-11-26 NOTE — Telephone Encounter (Signed)
Received 20 pages from [employer]. Sent to Dr. Maple Hudson. SD 11/26/11

## 2011-11-26 NOTE — Patient Instructions (Signed)
Your physician has requested that you have an echocardiogram. Echocardiography is a painless test that uses sound waves to create images of your heart. It provides your doctor with information about the size and shape of your heart and how well your heart's chambers and valves are working. This procedure takes approximately one hour. There are no restrictions for this procedure.  Your physician wants you to follow-up in: 1 YEAR.  You will receive a reminder letter in the mail two months in advance. If you don't receive a letter, please call our office to schedule the follow-up appointment.  Your physician recommends that you continue on your current medications as directed. Please refer to the Current Medication list given to you today.  Please review your records from [facility] for an echocardiogram.  If you have had this test performed you can cancel the echocardiogram appointment.  Please send a copy to our office.

## 2011-11-26 NOTE — Progress Notes (Signed)
HPI:  This is a 77 year old gentleman presenting for evaluation of coronary artery disease. This was diagnosed incidentally on CT imaging of the chest. He had a CAT scan performed about one year ago demonstrating coronary artery calcifications as well as mild to moderate atherosclerotic calcification of the aorta and branch vessels. The patient has undergone cardiac catheterization back in 2003. These records were available for my review. At that time he was noted to have minimal nonobstructive coronary artery disease in the main coronary arteries. He did have an 80% ostial stenosis of a small ramus intermedius branch. His left ventricular function and filling pressures were normal.  The patient has advanced COPD. He is on home oxygen 24/7. He is being considered for lung transplant and recommendations were made for cardiac stress testing. He wanted to review these recommendations with a local cardiologist and he presents today for evaluation. The patient denies chest pain or pressure. He denies edema, orthopnea, or PND. He is limited by dyspnea with exertion and he has class III symptoms. He denies palpitations, lightheadedness, or syncope. The patient brings in home blood pressure readings that show excellent blood pressure control with readings ranging from 106-122/56-73. His lipids have been treated with pravastatin.  Outpatient Encounter Prescriptions as of 11/26/2011  Medication Sig Dispense Refill  . albuterol (PROVENTIL HFA) 108 (90 BASE) MCG/ACT inhaler Inhale 2 puffs into the lungs every 6 (six) hours as needed.        [name] albuterol (PROVENTIL) (2.5 MG/3ML) 0.083% nebulizer solution Take 2.5 mg by nebulization every 6 (six) hours as needed.        [name] amLODipine (NORVASC) 5 MG tablet Take 5 mg by mouth daily.        [name] aspirin 81 MG tablet Take 81 mg by mouth daily.        . digoxin (LANOXIN) 0.25 MG tablet Take 250 mcg by mouth daily.        . finasteride (PROSCAR) 5 MG tablet Take 5 mg by mouth  every other day.        . losartan (COZAAR) 50 MG tablet Take 1 tablet by mouth Daily.      . Multiple Vitamin (MULTIVITAMIN) capsule Take 1 capsule by mouth daily.        . pravastatin (PRAVACHOL) 40 MG tablet Take 1 tablet by mouth daily.      . SYMBICORT 160-4.5 MCG/ACT inhaler Inhale 2 puffs into the lungs Twice daily.      [name] tiotropium (SPIRIVA) 18 MCG inhalation capsule Place 18 mcg into inhaler and inhale daily.        . ciprofloxacin (CIPRO) 500 MG tablet as needed.        Tolectin and Montelukast sodium  Past Medical History  Diagnosis Date  . COPD (chronic obstructive pulmonary disease)   . Allergic rhinitis, cause unspecified   . Pulmonary nodule   . Emphysema of lung     Past Surgical History  Procedure Date  . Tonsillectomy   . Appendectomy   . Rotator cuff repair   . Vasectomy     History   Social History  . Marital Status: Married    Spouse Name: N/A    Number of Children: N/A  . Years of Education: N/A   Occupational History  . Not on file.   Social History Main Topics  . Smoking status: Former Smoker    Quit date: 08/16/1990  . Smokeless tobacco: Not on file  . Alcohol Use: Not on file  .  Drug Use: Not on file  . Sexually Active: Not on file   Other Topics Concern  . Not on file   Social History Narrative  . No narrative on file    Family History  Problem Relation Age of Onset  . Coronary artery disease    . COPD Mother   . COPD Sister     ROS: General: no fevers/chills/night sweats Eyes: no blurry vision, diplopia, or amaurosis ENT: no sore throat or hearing loss Resp: no cough, wheezing, or hemoptysis CV: no edema or palpitations GI: no abdominal pain, nausea, vomiting, diarrhea, or constipation GU: no dysuria, frequency, or hematuria Skin: no rash Neuro: no headache, numbness, tingling, or weakness of extremities Musculoskeletal: no joint pain or swelling Heme: no bleeding, DVT, or easy bruising Endo: no polydipsia or  polyuria  BP 126/70  Ht 5\' 10"  (1.778 m)  Wt 86.183 kg (190 lb)  BMI 27.26 kg/m2  PHYSICAL EXAM: Pt is alert and oriented, WD, WN, in no distress. The patient is on oxygen per nasal cannula. His breathing is unlabored with conversation.  HEENT: normal Neck: JVP normal. Carotid upstrokes normal without bruits. No thyromegaly. Lungs: equal expansion, clear bilaterally CV: Apex is discrete and nondisplaced, RRR without murmur or gallop Abd: soft, NT, +BS, no bruit, no hepatosplenomegaly Back: no CVA tenderness Ext: no C/C/E        Femoral pulses 2+= without bruits        DP/PT pulses intact and = Skin: warm and dry without rash Neuro: CNII-XII intact             Strength intact = bilaterally  EKG:  Normal sinus rhythm 80 beats per minute, nonspecific ST and T wave abnormality. Rightward axis.  ASSESSMENT AND PLAN:

## 2011-11-30 ENCOUNTER — Encounter (HOSPITAL_COMMUNITY)
Admission: RE | Admit: 2011-11-30 | Discharge: 2011-11-30 | Disposition: A | Discharge status: Home / Self Care | Payer: Self-pay | Source: Ambulatory Visit | Attending: Internal Medicine | Admitting: Internal Medicine

## 2011-12-01 ENCOUNTER — Encounter: Payer: Self-pay | Admitting: Cardiovascular Disease

## 2011-12-01 DIAGNOSIS — E785 Hyperlipidemia, unspecified: Secondary | ICD-10-CM | POA: Insufficient documentation

## 2011-12-01 DIAGNOSIS — I10 Essential (primary) hypertension: Secondary | ICD-10-CM | POA: Insufficient documentation

## 2011-12-01 NOTE — Assessment & Plan Note (Signed)
Lipids were reviewed from February 2013. This shows a cholesterol of 157, triglycerides 82, HDL 64, and LDL 73. His lipid values are excellent on pravastatin.

## 2011-12-01 NOTE — Assessment & Plan Note (Signed)
The patient has subclinical coronary atherosclerosis. He does not have anginal symptoms. However, assessment of cardiac symptoms is challenging in this gentleman with advanced lung disease. I have recommended that he undergo an echocardiogram to evaluate LV and RV function as well as to provide a noninvasive evaluation of pulmonary pressures. We discussed the pros and cons of stress testing, and the patient has elected to defer this for now. If he seriously considers lung transplant, he will obviously need to undergo a stress test prior to consideration for surgery. I would like to see him back in followup in one year.

## 2011-12-01 NOTE — Assessment & Plan Note (Signed)
Excellent blood pressure control on amlodipine and losartan.

## 2011-12-02 ENCOUNTER — Encounter (HOSPITAL_COMMUNITY)
Admission: RE | Admit: 2011-12-02 | Discharge: 2011-12-02 | Disposition: A | Discharge status: Home / Self Care | Payer: Self-pay | Source: Ambulatory Visit | Attending: Internal Medicine | Admitting: Internal Medicine

## 2011-12-07 ENCOUNTER — Encounter (HOSPITAL_COMMUNITY)
Admission: RE | Admit: 2011-12-07 | Discharge: 2011-12-07 | Disposition: A | Discharge status: Home / Self Care | Payer: Self-pay | Source: Ambulatory Visit | Attending: Internal Medicine | Admitting: Internal Medicine

## 2011-12-09 ENCOUNTER — Encounter (HOSPITAL_COMMUNITY)
Admission: RE | Admit: 2011-12-09 | Discharge: 2011-12-09 | Disposition: A | Discharge status: Home / Self Care | Payer: Self-pay | Source: Ambulatory Visit | Attending: Internal Medicine | Admitting: Internal Medicine

## 2011-12-09 ENCOUNTER — Telehealth: Payer: Self-pay | Admitting: Internal Medicine

## 2011-12-09 MED ORDER — ACLIDINIUM BROMIDE 400 MCG/ACT IN AEPB: 1.0000 | INHALATION_SPRAY | Freq: Two times a day (BID) | RESPIRATORY_TRACT | Status: DC

## 2011-12-09 NOTE — Telephone Encounter (Signed)
To D/C Spiriva        Start [name] # 1, 1 puff, twice daily, refill prn

## 2011-12-09 NOTE — Telephone Encounter (Signed)
Spoke with pt. He states that has been taking the sample of tudorza and can tell slight difference for the better. He feels that this med may work better than the spiriva. Is it okay to go ahead and send rx? Please advise, thanks!

## 2011-12-09 NOTE — Telephone Encounter (Signed)
Pt aware rx was sent to pharm and he is aware to d/c spirva MAR updated

## 2011-12-10 ENCOUNTER — Other Ambulatory Visit (HOSPITAL_COMMUNITY): Payer: Medicare Other

## 2011-12-13 ENCOUNTER — Telehealth: Payer: Self-pay | Admitting: Internal Medicine

## 2011-12-13 MED ORDER — TIOTROPIUM BROMIDE MONOHYDRATE 18 MCG IN CAPS: 18.0000 ug | ORAL_CAPSULE | Freq: Every day | RESPIRATORY_TRACT | Status: DC

## 2011-12-13 NOTE — Telephone Encounter (Signed)
lmomtcb x1 

## 2011-12-13 NOTE — Telephone Encounter (Signed)
Per CY_try using [name] once daily; ok to RX spiriva #30 1 daily with prn refills.

## 2011-12-13 NOTE — Telephone Encounter (Signed)
Called, spoke with pt.  I informed him of below.  He would like clarification on this - ? Should he take both, the tudorza qd and the spiriva qd, or should be take one or the other because he was instructed at last OV to trial the tudorza in place of the spiriva.  Dr. Maple Hudson, pls advise?  Thank you.

## 2011-12-13 NOTE — Telephone Encounter (Signed)
I spoke with pt and he states he has been having side effects from the [place]. C/o HA, light headedness, and frequent urination since starting this medication 3 days ago. Pt states this really helped with his breathing though. Pt states he thinks he needs to go back on the spiriva and will need rx for this. Please advise Dr. Maple Hudson thanks   Allergies  Allergen Reactions  . Tolectin (Tolmetin Sodium) Other (See Comments)    BP low and passed out  . Montelukast Sodium      [facility]

## 2011-12-13 NOTE — Telephone Encounter (Signed)
I spoke with pt and is aware of cdy recs. rx has been sent to the pharmacy for spiriva

## 2011-12-13 NOTE — Telephone Encounter (Signed)
Per CY-just use Spiriva.

## 2011-12-14 ENCOUNTER — Encounter (HOSPITAL_COMMUNITY): Payer: Self-pay

## 2011-12-16 ENCOUNTER — Encounter (HOSPITAL_COMMUNITY)
Admission: RE | Admit: 2011-12-16 | Discharge: 2011-12-16 | Disposition: A | Discharge status: Home / Self Care | Payer: Self-pay | Source: Ambulatory Visit | Attending: Internal Medicine | Admitting: Internal Medicine

## 2011-12-16 ENCOUNTER — Ambulatory Visit (HOSPITAL_COMMUNITY): Payer: Medicare Other | Attending: Cardiovascular Disease

## 2011-12-16 ENCOUNTER — Other Ambulatory Visit: Payer: Self-pay

## 2011-12-16 DIAGNOSIS — I079 Rheumatic tricuspid valve disease, unspecified: Secondary | ICD-10-CM | POA: Insufficient documentation

## 2011-12-16 DIAGNOSIS — I2510 Atherosclerotic heart disease of native coronary artery without angina pectoris: Secondary | ICD-10-CM

## 2011-12-16 DIAGNOSIS — J4489 Other specified chronic obstructive pulmonary disease: Secondary | ICD-10-CM | POA: Insufficient documentation

## 2011-12-16 DIAGNOSIS — R0602 Shortness of breath: Secondary | ICD-10-CM

## 2011-12-16 DIAGNOSIS — J449 Chronic obstructive pulmonary disease, unspecified: Secondary | ICD-10-CM | POA: Insufficient documentation

## 2011-12-16 DIAGNOSIS — Z5189 Encounter for other specified aftercare: Secondary | ICD-10-CM | POA: Insufficient documentation

## 2011-12-16 DIAGNOSIS — J984 Other disorders of lung: Secondary | ICD-10-CM | POA: Insufficient documentation

## 2011-12-17 ENCOUNTER — Encounter: Payer: Self-pay | Admitting: Cardiovascular Disease

## 2011-12-17 NOTE — Telephone Encounter (Signed)
This encounter was created in error - please disregard.

## 2011-12-17 NOTE — Telephone Encounter (Signed)
Pt rtn your call re results

## 2011-12-21 ENCOUNTER — Encounter (HOSPITAL_COMMUNITY)
Admission: RE | Admit: 2011-12-21 | Discharge: 2011-12-21 | Disposition: A | Discharge status: Home / Self Care | Payer: Self-pay | Source: Ambulatory Visit | Attending: Internal Medicine | Admitting: Internal Medicine

## 2011-12-23 ENCOUNTER — Encounter (HOSPITAL_COMMUNITY)
Admission: RE | Admit: 2011-12-23 | Discharge: 2011-12-23 | Disposition: A | Discharge status: Home / Self Care | Payer: Self-pay | Source: Ambulatory Visit | Attending: Internal Medicine | Admitting: Internal Medicine

## 2011-12-28 ENCOUNTER — Encounter (HOSPITAL_COMMUNITY)
Admission: RE | Admit: 2011-12-28 | Discharge: 2011-12-28 | Disposition: A | Discharge status: Home / Self Care | Payer: Self-pay | Source: Ambulatory Visit | Attending: Internal Medicine | Admitting: Internal Medicine

## 2011-12-30 ENCOUNTER — Encounter (HOSPITAL_COMMUNITY)
Admission: RE | Admit: 2011-12-30 | Discharge: 2011-12-30 | Disposition: A | Discharge status: Home / Self Care | Payer: Self-pay | Source: Ambulatory Visit | Attending: Internal Medicine | Admitting: Internal Medicine

## 2012-01-03 DIAGNOSIS — R7301 Impaired fasting glucose: Secondary | ICD-10-CM | POA: Diagnosis not present

## 2012-01-04 ENCOUNTER — Encounter (HOSPITAL_COMMUNITY)
Admission: RE | Admit: 2012-01-04 | Discharge: 2012-01-04 | Disposition: A | Discharge status: Home / Self Care | Payer: Self-pay | Source: Ambulatory Visit | Attending: Internal Medicine | Admitting: Internal Medicine

## 2012-01-06 ENCOUNTER — Encounter (HOSPITAL_COMMUNITY)
Admission: RE | Admit: 2012-01-06 | Discharge: 2012-01-06 | Disposition: A | Discharge status: Home / Self Care | Payer: Self-pay | Source: Ambulatory Visit | Attending: Internal Medicine | Admitting: Internal Medicine

## 2012-01-11 ENCOUNTER — Encounter (HOSPITAL_COMMUNITY)
Admission: RE | Admit: 2012-01-11 | Discharge: 2012-01-11 | Disposition: A | Discharge status: Home / Self Care | Payer: Self-pay | Source: Ambulatory Visit | Attending: Internal Medicine | Admitting: Internal Medicine

## 2012-01-13 ENCOUNTER — Encounter (HOSPITAL_COMMUNITY)
Admission: RE | Admit: 2012-01-13 | Discharge: 2012-01-13 | Disposition: A | Discharge status: Home / Self Care | Payer: Self-pay | Source: Ambulatory Visit | Attending: Internal Medicine | Admitting: Internal Medicine

## 2012-01-18 ENCOUNTER — Encounter (HOSPITAL_COMMUNITY): Payer: Self-pay

## 2012-01-18 DIAGNOSIS — J449 Chronic obstructive pulmonary disease, unspecified: Secondary | ICD-10-CM | POA: Insufficient documentation

## 2012-01-18 DIAGNOSIS — R0602 Shortness of breath: Secondary | ICD-10-CM | POA: Insufficient documentation

## 2012-01-18 DIAGNOSIS — Z5189 Encounter for other specified aftercare: Secondary | ICD-10-CM | POA: Insufficient documentation

## 2012-01-18 DIAGNOSIS — J984 Other disorders of lung: Secondary | ICD-10-CM | POA: Insufficient documentation

## 2012-01-18 DIAGNOSIS — J4489 Other specified chronic obstructive pulmonary disease: Secondary | ICD-10-CM | POA: Insufficient documentation

## 2012-01-20 ENCOUNTER — Encounter (HOSPITAL_COMMUNITY): Payer: Self-pay

## 2012-01-25 ENCOUNTER — Encounter (HOSPITAL_COMMUNITY)
Admission: RE | Admit: 2012-01-25 | Discharge: 2012-01-25 | Disposition: A | Discharge status: Home / Self Care | Payer: Self-pay | Source: Ambulatory Visit | Attending: Internal Medicine | Admitting: Internal Medicine

## 2012-01-27 ENCOUNTER — Encounter (HOSPITAL_COMMUNITY)
Admission: RE | Admit: 2012-01-27 | Discharge: 2012-01-27 | Disposition: A | Discharge status: Home / Self Care | Payer: Self-pay | Source: Ambulatory Visit | Attending: Internal Medicine | Admitting: Internal Medicine

## 2012-02-01 ENCOUNTER — Encounter (HOSPITAL_COMMUNITY)
Admission: RE | Admit: 2012-02-01 | Discharge: 2012-02-01 | Disposition: A | Discharge status: Home / Self Care | Payer: Self-pay | Source: Ambulatory Visit | Attending: Internal Medicine | Admitting: Internal Medicine

## 2012-02-03 ENCOUNTER — Encounter (HOSPITAL_COMMUNITY)
Admission: RE | Admit: 2012-02-03 | Discharge: 2012-02-03 | Disposition: A | Discharge status: Home / Self Care | Payer: Self-pay | Source: Ambulatory Visit | Attending: Internal Medicine | Admitting: Internal Medicine

## 2012-02-08 ENCOUNTER — Encounter (HOSPITAL_COMMUNITY)
Admission: RE | Admit: 2012-02-08 | Discharge: 2012-02-08 | Disposition: A | Discharge status: Home / Self Care | Payer: Self-pay | Source: Ambulatory Visit | Attending: Internal Medicine | Admitting: Internal Medicine

## 2012-02-10 ENCOUNTER — Encounter (HOSPITAL_COMMUNITY)
Admission: RE | Admit: 2012-02-10 | Discharge: 2012-02-10 | Disposition: A | Discharge status: Home / Self Care | Payer: Self-pay | Source: Ambulatory Visit | Attending: Internal Medicine | Admitting: Internal Medicine

## 2012-02-15 ENCOUNTER — Encounter (HOSPITAL_COMMUNITY)
Admission: RE | Admit: 2012-02-15 | Discharge: 2012-02-15 | Disposition: A | Discharge status: Home / Self Care | Payer: Self-pay | Source: Ambulatory Visit | Attending: Internal Medicine | Admitting: Internal Medicine

## 2012-02-15 DIAGNOSIS — J449 Chronic obstructive pulmonary disease, unspecified: Secondary | ICD-10-CM | POA: Insufficient documentation

## 2012-02-15 DIAGNOSIS — R0602 Shortness of breath: Secondary | ICD-10-CM | POA: Insufficient documentation

## 2012-02-15 DIAGNOSIS — J984 Other disorders of lung: Secondary | ICD-10-CM | POA: Insufficient documentation

## 2012-02-15 DIAGNOSIS — R972 Elevated prostate specific antigen [PSA]: Secondary | ICD-10-CM | POA: Diagnosis not present

## 2012-02-15 DIAGNOSIS — Z5189 Encounter for other specified aftercare: Secondary | ICD-10-CM | POA: Insufficient documentation

## 2012-02-15 DIAGNOSIS — J4489 Other specified chronic obstructive pulmonary disease: Secondary | ICD-10-CM | POA: Insufficient documentation

## 2012-02-17 ENCOUNTER — Encounter (HOSPITAL_COMMUNITY): Payer: Self-pay

## 2012-02-22 ENCOUNTER — Encounter (HOSPITAL_COMMUNITY)
Admission: RE | Admit: 2012-02-22 | Discharge: 2012-02-22 | Disposition: A | Discharge status: Home / Self Care | Payer: Self-pay | Source: Ambulatory Visit | Attending: Internal Medicine | Admitting: Internal Medicine

## 2012-02-24 ENCOUNTER — Encounter (HOSPITAL_COMMUNITY)
Admission: RE | Admit: 2012-02-24 | Discharge: 2012-02-24 | Disposition: A | Discharge status: Home / Self Care | Payer: Self-pay | Source: Ambulatory Visit | Attending: Internal Medicine | Admitting: Internal Medicine

## 2012-02-29 ENCOUNTER — Encounter (HOSPITAL_COMMUNITY): Payer: Self-pay

## 2012-03-02 ENCOUNTER — Encounter (HOSPITAL_COMMUNITY): Payer: Self-pay

## 2012-03-07 ENCOUNTER — Encounter (HOSPITAL_COMMUNITY)
Admission: RE | Admit: 2012-03-07 | Discharge: 2012-03-07 | Disposition: A | Discharge status: Home / Self Care | Payer: Self-pay | Source: Ambulatory Visit | Attending: Internal Medicine | Admitting: Internal Medicine

## 2012-03-09 ENCOUNTER — Encounter (HOSPITAL_COMMUNITY)
Admission: RE | Admit: 2012-03-09 | Discharge: 2012-03-09 | Disposition: A | Discharge status: Home / Self Care | Payer: Self-pay | Source: Ambulatory Visit | Attending: Internal Medicine | Admitting: Internal Medicine

## 2012-03-14 ENCOUNTER — Encounter (HOSPITAL_COMMUNITY)
Admission: RE | Admit: 2012-03-14 | Discharge: 2012-03-14 | Disposition: A | Discharge status: Home / Self Care | Payer: Self-pay | Source: Ambulatory Visit | Attending: Internal Medicine | Admitting: Internal Medicine

## 2012-03-16 ENCOUNTER — Encounter (HOSPITAL_COMMUNITY): Payer: Self-pay

## 2012-03-16 DIAGNOSIS — J4489 Other specified chronic obstructive pulmonary disease: Secondary | ICD-10-CM | POA: Insufficient documentation

## 2012-03-16 DIAGNOSIS — R0602 Shortness of breath: Secondary | ICD-10-CM | POA: Insufficient documentation

## 2012-03-16 DIAGNOSIS — J449 Chronic obstructive pulmonary disease, unspecified: Secondary | ICD-10-CM | POA: Insufficient documentation

## 2012-03-16 DIAGNOSIS — Z5189 Encounter for other specified aftercare: Secondary | ICD-10-CM | POA: Insufficient documentation

## 2012-03-16 DIAGNOSIS — J984 Other disorders of lung: Secondary | ICD-10-CM | POA: Insufficient documentation

## 2012-03-21 ENCOUNTER — Encounter (HOSPITAL_COMMUNITY)
Admission: RE | Admit: 2012-03-21 | Discharge: 2012-03-21 | Disposition: A | Discharge status: Home / Self Care | Payer: Self-pay | Source: Ambulatory Visit | Attending: Internal Medicine | Admitting: Internal Medicine

## 2012-03-23 ENCOUNTER — Encounter (HOSPITAL_COMMUNITY)
Admission: RE | Admit: 2012-03-23 | Discharge: 2012-03-23 | Disposition: A | Discharge status: Home / Self Care | Payer: Self-pay | Source: Ambulatory Visit | Attending: Internal Medicine | Admitting: Internal Medicine

## 2012-03-28 ENCOUNTER — Encounter (HOSPITAL_COMMUNITY)
Admission: RE | Admit: 2012-03-28 | Discharge: 2012-03-28 | Disposition: A | Discharge status: Home / Self Care | Payer: Self-pay | Source: Ambulatory Visit | Attending: Internal Medicine | Admitting: Internal Medicine

## 2012-03-30 ENCOUNTER — Encounter (HOSPITAL_COMMUNITY)
Admission: RE | Admit: 2012-03-30 | Discharge: 2012-03-30 | Disposition: A | Discharge status: Home / Self Care | Payer: Self-pay | Source: Ambulatory Visit | Attending: Internal Medicine | Admitting: Internal Medicine

## 2012-04-04 ENCOUNTER — Encounter (HOSPITAL_COMMUNITY)
Admission: RE | Admit: 2012-04-04 | Discharge: 2012-04-04 | Disposition: A | Discharge status: Home / Self Care | Payer: Self-pay | Source: Ambulatory Visit | Attending: Internal Medicine | Admitting: Internal Medicine

## 2012-04-06 ENCOUNTER — Encounter (HOSPITAL_COMMUNITY)
Admission: RE | Admit: 2012-04-06 | Discharge: 2012-04-06 | Disposition: A | Discharge status: Home / Self Care | Payer: Self-pay | Source: Ambulatory Visit | Attending: Internal Medicine | Admitting: Internal Medicine

## 2012-04-11 ENCOUNTER — Encounter (HOSPITAL_COMMUNITY)
Admission: RE | Admit: 2012-04-11 | Discharge: 2012-04-11 | Disposition: A | Discharge status: Home / Self Care | Payer: Self-pay | Source: Ambulatory Visit | Attending: Internal Medicine | Admitting: Internal Medicine

## 2012-04-13 ENCOUNTER — Encounter (HOSPITAL_COMMUNITY)
Admission: RE | Admit: 2012-04-13 | Discharge: 2012-04-13 | Disposition: A | Discharge status: Home / Self Care | Payer: Self-pay | Source: Ambulatory Visit | Attending: Internal Medicine | Admitting: Internal Medicine

## 2012-04-18 ENCOUNTER — Encounter (HOSPITAL_COMMUNITY)
Admission: RE | Admit: 2012-04-18 | Discharge: 2012-04-18 | Disposition: A | Discharge status: Home / Self Care | Payer: Self-pay | Source: Ambulatory Visit | Attending: Internal Medicine | Admitting: Internal Medicine

## 2012-04-18 DIAGNOSIS — J984 Other disorders of lung: Secondary | ICD-10-CM | POA: Insufficient documentation

## 2012-04-18 DIAGNOSIS — J4489 Other specified chronic obstructive pulmonary disease: Secondary | ICD-10-CM | POA: Insufficient documentation

## 2012-04-18 DIAGNOSIS — J449 Chronic obstructive pulmonary disease, unspecified: Secondary | ICD-10-CM | POA: Insufficient documentation

## 2012-04-18 DIAGNOSIS — R0602 Shortness of breath: Secondary | ICD-10-CM | POA: Insufficient documentation

## 2012-04-18 DIAGNOSIS — Z5189 Encounter for other specified aftercare: Secondary | ICD-10-CM | POA: Insufficient documentation

## 2012-04-20 ENCOUNTER — Encounter (HOSPITAL_COMMUNITY)
Admission: RE | Admit: 2012-04-20 | Discharge: 2012-04-20 | Disposition: A | Discharge status: Home / Self Care | Payer: Self-pay | Source: Ambulatory Visit | Attending: Internal Medicine | Admitting: Internal Medicine

## 2012-04-25 ENCOUNTER — Encounter (HOSPITAL_COMMUNITY)
Admission: RE | Admit: 2012-04-25 | Discharge: 2012-04-25 | Disposition: A | Discharge status: Home / Self Care | Payer: Self-pay | Source: Ambulatory Visit | Attending: Internal Medicine | Admitting: Internal Medicine

## 2012-04-27 ENCOUNTER — Encounter (HOSPITAL_COMMUNITY): Payer: Self-pay

## 2012-05-02 ENCOUNTER — Encounter (HOSPITAL_COMMUNITY)
Admission: RE | Admit: 2012-05-02 | Discharge: 2012-05-02 | Disposition: A | Discharge status: Home / Self Care | Payer: Self-pay | Source: Ambulatory Visit | Attending: Internal Medicine | Admitting: Internal Medicine

## 2012-05-04 ENCOUNTER — Encounter: Payer: Self-pay | Admitting: Internal Medicine

## 2012-05-04 ENCOUNTER — Ambulatory Visit (INDEPENDENT_AMBULATORY_CARE_PROVIDER_SITE_OTHER): Payer: Medicare Other | Admitting: Internal Medicine

## 2012-05-04 ENCOUNTER — Encounter (HOSPITAL_COMMUNITY)
Admission: RE | Admit: 2012-05-04 | Discharge: 2012-05-04 | Disposition: A | Discharge status: Home / Self Care | Payer: Self-pay | Source: Ambulatory Visit | Attending: Internal Medicine | Admitting: Internal Medicine

## 2012-05-04 VITALS — BP 138/60 | HR 88 | Ht 70.0 in | Wt 189.2 lb

## 2012-05-04 DIAGNOSIS — J449 Chronic obstructive pulmonary disease, unspecified: Secondary | ICD-10-CM

## 2012-05-04 DIAGNOSIS — J984 Other disorders of lung: Secondary | ICD-10-CM | POA: Diagnosis not present

## 2012-05-04 DIAGNOSIS — R918 Other nonspecific abnormal finding of lung field: Secondary | ICD-10-CM

## 2012-05-04 MED ORDER — BUDESONIDE-FORMOTEROL FUMARATE 160-4.5 MCG/ACT IN AERO: 2.0000 | INHALATION_SPRAY | Freq: Two times a day (BID) | RESPIRATORY_TRACT | Status: DC

## 2012-05-04 MED ORDER — TIOTROPIUM BROMIDE MONOHYDRATE 18 MCG IN CAPS: 18.0000 ug | ORAL_CAPSULE | Freq: Every day | RESPIRATORY_TRACT | Status: DC

## 2012-05-04 MED ORDER — FLUTICASONE-SALMETEROL 250-50 MCG/DOSE IN AEPB: 1.0000 | INHALATION_SPRAY | Freq: Two times a day (BID) | RESPIRATORY_TRACT | Status: DC

## 2012-05-04 MED ORDER — ACLIDINIUM BROMIDE 400 MCG/ACT IN AEPB: 1.0000 | INHALATION_SPRAY | Freq: Two times a day (BID) | RESPIRATORY_TRACT | Status: DC

## 2012-05-04 NOTE — Patient Instructions (Addendum)
Flu vax Consider getting a second dose as a booster in late December  Test for a1AT deficiency  Order- CT chest w/o contrast  Dx lung nodules   Compare these two: Sample [name]   1 puff, twice daily Sample Spiriva  1 daily   Compare these two: Sample Advair 250   1 puff then rinse, twice daily Sample Symbicort 160  2 puffs then rinse, twice daily

## 2012-05-04 NOTE — Progress Notes (Signed)
Patient ID: ALFREDO SPONG, male    DOB: 06-13-1935, 76 y.o.   MRN: [id number] PCP Dr Hulan Fess HPI 01/25/11- 24 yo M former smoker followed for COPD/ emphysema with recent concern for pulmonary nodules. Last here May 20, 2010- note reviewed. CT done during the winter showed nodules.  PET positive nodule Left lung faded before needle bx. He had gone to [facility] for second opinion w/ Dr [name] where severity of his advanced lung disease was emphasized.  lung volume reduction and transplant were mentioned although I don't know how seriously, given his age.[name] He comes today to update and to have conference including his wife:  Conference on overall status: He had concern about amount of radiation from imaging and I reminded him that is why we chose to do CXR 05/20/10 which showed stable COPD. PFT 07/01/05 - FEV1 1.13/ 0.39; FEV1/FVC 0.25; DLCO 0.52; insignif response to bronchodilator. PFT at [facility] 11/26/10- FEV1 0.82/ 0.25; FEV1/FVC 0.22 Quality of life and prognosis discussed. He has noted some decline over past year. He could walk through [employer] without O2 then and no longer can. Emphysema pattern with little cough, wheeze, phlegm. No chest pain, palpitation or ankle edema. After long discussion we agreed to an inquiry to [facility] Transplant coordinator for consideration. I told him his age made acceptance unlikely. We discussed limited role of meds,He has had Pneumovax, annual flu vax, Pulmonary Rehab, and test normal for a1AT in the past. Has O2 2l/m for sleep and exertion, home nebulizer. He asks another script for prednisone to hold and we discussed side effects.   05/20/11 77 yo M former smoker followed for COPD/ emphysema with recent concern for pulmonary nodules. .. wife here He and his wife come today for an extended review of his early experience with [facility] lung transplant team evaluation. PFT- [facility] 04/27/11- FEV1 0.78/23%, FEV1/FVC 0.27, TLC 98%, RV 163%, DLCO 37%. ABG-room air-04/27/2011  pH 7.42, PCO2 40, PO2 63, bicarbonate 26, O2 saturation 91.9% 6 minute walk test 04/27/2011: 314.6 m (60% to distance predicted) with significant dyspnea while wearing oxygen at 3 L. He came away with significant reservations about the prospects for transplant. He asked for my estimate of his life expectancy without transplant. I suggested that barring an intervening acute illness, an additional 5 years is possible. He understands this is a very loose estimate. I deferred questions about hepatitis B vaccination and testosterone replacement to his primary physician. He wanted to know what else we might do to test and treat him if he chose not to seek transplant. We will watch his lung nodules with chest x-ray and/or CT, but our ability to intervene would be limited. If he does not have transplant, I don't know that cardiac stress test or repeat pulmonary function tests would add much useful  information unless symptoms change.  10/20/11- 26 yo M former smoker followed for COPD/ emphysema with recent concern for pulmonary nodules [facility] CXR 04/27/11- COPD with no mention of nodules. Followup at [facility] pulmonary has been deferred until September. They want stress test. He is leaning away from transplant at this time of blood and he wanted my thoughts. Discussed his left upper lobe nodule, atherosclerosis on CT scan in April of 2012. He continues pulmonary rehabilitation. Oxygen saturation on 3 L is 96% during exercise. He feels his current life quality is okay. Denies chest pain, acute discomfort, edema. Little cough or phlegm.  05/04/12-  41 yo M former smoker followed for COPD/ emphysema with  recent concern for pulmonary nodules [facility] CXR 04/27/11- COPD with no mention of nodules. Sob same, cough yellow to clear, no wheezing, no fcs He talked about another meeting with the [facility] transplant doctors. At this point he is less interested in lung transplant as an option. Echocardiogram 12/16/2011 showed EF 65-70%, no  pulmonary hypertension, grade 1 diastolic dysfunction. [name] increased nocturia. Oxygen is used with sleep and exertion  Review of Systems-see HPI Constitutional:   No-   weight loss, night sweats, fevers, chills, fatigue, lassitude. HEENT:   No-  headaches, difficulty swallowing, tooth/dental problems, sore throat,       No-  sneezing, itching, ear ache, nasal congestion, post nasal drip,  CV:  No-   chest pain, orthopnea, PND, swelling in lower extremities, anasarca, dizziness, palpitations Resp: + shortness of breath with exertion, not at rest.              No-   productive cough,  very little non-productive cough,  No-  coughing up of blood.              No-   change in color of mucus.  No- wheezing.   Skin: No-   rash or lesions. GI:  No-   heartburn, indigestion, abdominal pain, nausea,   GU: No-   dysuria,  MS:  No-   joint pain or swelling.  Neuro-  Psych:  No- change in mood or affect. No acute depression or anxiety.  No memory loss.   OBJ- BP 138/60  Pulse 88  Ht 5\' 10"  (1.778 m)  Wt 85.821 kg (189 lb 3.2 oz)  BMI 27.15 kg/m2  SpO2 96% General- Alert, Oriented, Affect-appropriate, Distress- none acute. He brought his oxygen but sat without wearing it again.  Skin- rash-none, lesions- none, excoriation- none Lymphadenopathy- none Head- atraumatic            Eyes- Gross vision intact, PERRLA, conjunctivae clear secretions            Ears- Hearing, canals-normal            Nose- Clear, no-Septal dev, mucus, polyps, erosion, perforation             Throat- Mallampati II , mucosa clear , drainage- none, tonsils- atrophic Neck- flexible , trachea midline, no stridor , thyroid nl, carotid no bruit Chest - symmetrical excursion , unlabored           Heart/CV- RRR , no murmur , no gallop  , no rub, nl s1 s2                           - JVD- none , edema+1, stasis changes- none, varices- none           Lung- + trace wheeze in lung bases, cough- none , dullness-none, rub- none            Chest wall-  Abd-  Br/ Gen/ Rectal- Not done, not indicated Extrem- cyanosis- none, clubbing, none, atrophy- none, strength- nl Neuro- grossly intact to observation

## 2012-05-09 ENCOUNTER — Encounter (HOSPITAL_COMMUNITY)
Admission: RE | Admit: 2012-05-09 | Discharge: 2012-05-09 | Disposition: A | Discharge status: Home / Self Care | Payer: Self-pay | Source: Ambulatory Visit | Attending: Internal Medicine | Admitting: Internal Medicine

## 2012-05-09 ENCOUNTER — Other Ambulatory Visit: Payer: Medicare Other

## 2012-05-10 ENCOUNTER — Ambulatory Visit (INDEPENDENT_AMBULATORY_CARE_PROVIDER_SITE_OTHER)
Admission: RE | Admit: 2012-05-10 | Discharge: 2012-05-10 | Disposition: A | Discharge status: Home / Self Care | Payer: Medicare Other | Source: Ambulatory Visit | Attending: Internal Medicine | Admitting: Internal Medicine

## 2012-05-10 DIAGNOSIS — R918 Other nonspecific abnormal finding of lung field: Secondary | ICD-10-CM

## 2012-05-11 ENCOUNTER — Encounter (HOSPITAL_COMMUNITY)
Admission: RE | Admit: 2012-05-11 | Discharge: 2012-05-11 | Disposition: A | Discharge status: Home / Self Care | Payer: Self-pay | Source: Ambulatory Visit | Attending: Internal Medicine | Admitting: Internal Medicine

## 2012-05-13 NOTE — Assessment & Plan Note (Addendum)
He is less interested in lung transplant now. Plan-sample Spiriva, Advair 250 and Symbicort 160. We discussed comparison. Recheck Alpha I antitrypsin level since we don't find any earlier 1. Flu vaccine and consider a booster in late December

## 2012-05-13 NOTE — Assessment & Plan Note (Signed)
Plan-update CT chest 

## 2012-05-16 ENCOUNTER — Encounter (HOSPITAL_COMMUNITY): Payer: Medicare Other | Attending: Internal Medicine

## 2012-05-16 DIAGNOSIS — J449 Chronic obstructive pulmonary disease, unspecified: Secondary | ICD-10-CM | POA: Insufficient documentation

## 2012-05-16 DIAGNOSIS — Z5189 Encounter for other specified aftercare: Secondary | ICD-10-CM | POA: Insufficient documentation

## 2012-05-16 DIAGNOSIS — R0602 Shortness of breath: Secondary | ICD-10-CM | POA: Insufficient documentation

## 2012-05-16 DIAGNOSIS — J984 Other disorders of lung: Secondary | ICD-10-CM | POA: Insufficient documentation

## 2012-05-16 DIAGNOSIS — J4489 Other specified chronic obstructive pulmonary disease: Secondary | ICD-10-CM | POA: Insufficient documentation

## 2012-05-16 NOTE — Progress Notes (Signed)
PULMONARY REHAB MAINTENANCE OUTCOMES REPORT  Patient is taking some time off from the pulmonary rehab maintenance program. He plans to go to the beach like he always does every year. Pt has completed several months in the pulmonary rehab program and has done great. All exercise flowsheets have been scanned in.  Gauri Galvao L. Manson Passey, MS, NASM, CES

## 2012-05-18 ENCOUNTER — Encounter (HOSPITAL_COMMUNITY): Payer: Medicare Other

## 2012-05-19 NOTE — Progress Notes (Signed)
Quick Note:  Pt aware of results. ______ 

## 2012-05-23 ENCOUNTER — Encounter (HOSPITAL_COMMUNITY): Payer: Medicare Other

## 2012-05-25 ENCOUNTER — Telehealth: Payer: Self-pay | Admitting: Internal Medicine

## 2012-05-25 ENCOUNTER — Encounter (HOSPITAL_COMMUNITY): Payer: Medicare Other

## 2012-05-25 MED ORDER — BUDESONIDE-FORMOTEROL FUMARATE 160-4.5 MCG/ACT IN AERO: 2.0000 | INHALATION_SPRAY | Freq: Two times a day (BID) | RESPIRATORY_TRACT | Status: DC

## 2012-05-25 NOTE — Telephone Encounter (Signed)
Pt aware RX has been sent to the pharmacy. Nothing further was needed 

## 2012-05-30 ENCOUNTER — Encounter (HOSPITAL_COMMUNITY): Payer: Medicare Other

## 2012-06-01 ENCOUNTER — Encounter (HOSPITAL_COMMUNITY): Payer: Medicare Other

## 2012-06-02 ENCOUNTER — Encounter: Payer: Self-pay | Admitting: Internal Medicine

## 2012-06-06 ENCOUNTER — Encounter (HOSPITAL_COMMUNITY): Payer: Medicare Other

## 2012-06-08 ENCOUNTER — Encounter (HOSPITAL_COMMUNITY): Payer: Medicare Other

## 2012-06-13 ENCOUNTER — Encounter (HOSPITAL_COMMUNITY): Payer: Medicare Other

## 2012-06-14 ENCOUNTER — Telehealth: Payer: Self-pay | Admitting: Internal Medicine

## 2012-06-14 NOTE — Telephone Encounter (Signed)
Pt is aware that test results are normal.

## 2012-06-22 ENCOUNTER — Ambulatory Visit (HOSPITAL_COMMUNITY): Payer: Medicare Other

## 2012-06-27 ENCOUNTER — Ambulatory Visit (HOSPITAL_COMMUNITY): Payer: Medicare Other

## 2012-06-27 ENCOUNTER — Encounter (HOSPITAL_COMMUNITY)
Admission: RE | Admit: 2012-06-27 | Discharge: 2012-06-27 | Disposition: A | Discharge status: Home / Self Care | Payer: Self-pay | Source: Ambulatory Visit | Attending: Internal Medicine | Admitting: Internal Medicine

## 2012-06-27 DIAGNOSIS — J984 Other disorders of lung: Secondary | ICD-10-CM | POA: Insufficient documentation

## 2012-06-27 DIAGNOSIS — J4489 Other specified chronic obstructive pulmonary disease: Secondary | ICD-10-CM | POA: Insufficient documentation

## 2012-06-27 DIAGNOSIS — J449 Chronic obstructive pulmonary disease, unspecified: Secondary | ICD-10-CM | POA: Insufficient documentation

## 2012-06-27 DIAGNOSIS — R0602 Shortness of breath: Secondary | ICD-10-CM | POA: Insufficient documentation

## 2012-06-27 DIAGNOSIS — Z5189 Encounter for other specified aftercare: Secondary | ICD-10-CM | POA: Insufficient documentation

## 2012-06-28 DIAGNOSIS — Z86010 Personal history of colonic polyps: Secondary | ICD-10-CM | POA: Diagnosis not present

## 2012-06-28 DIAGNOSIS — Z09 Encounter for follow-up examination after completed treatment for conditions other than malignant neoplasm: Secondary | ICD-10-CM | POA: Diagnosis not present

## 2012-06-28 DIAGNOSIS — K5730 Diverticulosis of large intestine without perforation or abscess without bleeding: Secondary | ICD-10-CM | POA: Diagnosis not present

## 2012-06-29 ENCOUNTER — Ambulatory Visit (HOSPITAL_COMMUNITY): Payer: Medicare Other

## 2012-06-29 ENCOUNTER — Encounter (HOSPITAL_COMMUNITY): Payer: Self-pay

## 2012-07-04 ENCOUNTER — Encounter (HOSPITAL_COMMUNITY)
Admission: RE | Admit: 2012-07-04 | Discharge: 2012-07-04 | Disposition: A | Discharge status: Home / Self Care | Payer: Self-pay | Source: Ambulatory Visit | Attending: Internal Medicine | Admitting: Internal Medicine

## 2012-07-04 ENCOUNTER — Ambulatory Visit (HOSPITAL_COMMUNITY): Payer: Medicare Other

## 2012-07-06 ENCOUNTER — Encounter (HOSPITAL_COMMUNITY)
Admission: RE | Admit: 2012-07-06 | Discharge: 2012-07-06 | Disposition: A | Discharge status: Home / Self Care | Payer: Self-pay | Source: Ambulatory Visit | Attending: Internal Medicine | Admitting: Internal Medicine

## 2012-07-06 ENCOUNTER — Ambulatory Visit (HOSPITAL_COMMUNITY): Payer: Medicare Other

## 2012-07-11 ENCOUNTER — Encounter (HOSPITAL_COMMUNITY): Payer: Self-pay

## 2012-07-11 ENCOUNTER — Ambulatory Visit (HOSPITAL_COMMUNITY): Payer: Medicare Other

## 2012-07-13 ENCOUNTER — Encounter (HOSPITAL_COMMUNITY): Payer: Self-pay

## 2012-07-13 ENCOUNTER — Ambulatory Visit (HOSPITAL_COMMUNITY): Payer: Medicare Other

## 2012-07-18 ENCOUNTER — Encounter (HOSPITAL_COMMUNITY)
Admission: RE | Admit: 2012-07-18 | Discharge: 2012-07-18 | Disposition: A | Discharge status: Home / Self Care | Payer: Self-pay | Source: Ambulatory Visit | Attending: Internal Medicine | Admitting: Internal Medicine

## 2012-07-18 ENCOUNTER — Ambulatory Visit (HOSPITAL_COMMUNITY): Payer: Medicare Other

## 2012-07-18 DIAGNOSIS — R0602 Shortness of breath: Secondary | ICD-10-CM | POA: Insufficient documentation

## 2012-07-18 DIAGNOSIS — Z5189 Encounter for other specified aftercare: Secondary | ICD-10-CM | POA: Insufficient documentation

## 2012-07-18 DIAGNOSIS — J4489 Other specified chronic obstructive pulmonary disease: Secondary | ICD-10-CM | POA: Insufficient documentation

## 2012-07-18 DIAGNOSIS — J449 Chronic obstructive pulmonary disease, unspecified: Secondary | ICD-10-CM | POA: Insufficient documentation

## 2012-07-18 DIAGNOSIS — J984 Other disorders of lung: Secondary | ICD-10-CM | POA: Insufficient documentation

## 2012-07-20 ENCOUNTER — Encounter (HOSPITAL_COMMUNITY)
Admission: RE | Admit: 2012-07-20 | Discharge: 2012-07-20 | Disposition: A | Discharge status: Home / Self Care | Payer: Self-pay | Source: Ambulatory Visit | Attending: Internal Medicine | Admitting: Internal Medicine

## 2012-07-20 ENCOUNTER — Ambulatory Visit (HOSPITAL_COMMUNITY): Payer: Medicare Other

## 2012-07-25 ENCOUNTER — Encounter (HOSPITAL_COMMUNITY): Payer: Self-pay

## 2012-07-25 ENCOUNTER — Ambulatory Visit (HOSPITAL_COMMUNITY): Payer: Medicare Other

## 2012-07-27 ENCOUNTER — Ambulatory Visit (HOSPITAL_COMMUNITY): Payer: Medicare Other

## 2012-07-27 ENCOUNTER — Encounter (HOSPITAL_COMMUNITY)
Admission: RE | Admit: 2012-07-27 | Discharge: 2012-07-27 | Disposition: A | Discharge status: Home / Self Care | Payer: Self-pay | Source: Ambulatory Visit | Attending: Internal Medicine | Admitting: Internal Medicine

## 2012-07-27 NOTE — Progress Notes (Signed)
Mr. [name] is doing well in Pulmonary Rehab.  Exercising regularly without difficulty.  Vital signs stable. He plans to be away for the month of January.  He would like to stay enrolled in the program so he may resume exercise when he returns.  He fully understands he will be billed for this time off.   [name] RN

## 2012-08-01 ENCOUNTER — Encounter (HOSPITAL_COMMUNITY)
Admission: RE | Admit: 2012-08-01 | Discharge: 2012-08-01 | Disposition: A | Discharge status: Home / Self Care | Payer: Self-pay | Source: Ambulatory Visit | Attending: Internal Medicine | Admitting: Internal Medicine

## 2012-08-01 ENCOUNTER — Ambulatory Visit (HOSPITAL_COMMUNITY): Payer: Medicare Other

## 2012-08-03 ENCOUNTER — Encounter (HOSPITAL_COMMUNITY)
Admission: RE | Admit: 2012-08-03 | Discharge: 2012-08-03 | Disposition: A | Discharge status: Home / Self Care | Payer: Self-pay | Source: Ambulatory Visit | Attending: Internal Medicine | Admitting: Internal Medicine

## 2012-08-03 ENCOUNTER — Ambulatory Visit (HOSPITAL_COMMUNITY): Payer: Medicare Other

## 2012-08-08 ENCOUNTER — Encounter (HOSPITAL_COMMUNITY): Payer: Self-pay

## 2012-08-08 ENCOUNTER — Ambulatory Visit (HOSPITAL_COMMUNITY): Payer: Medicare Other

## 2012-08-10 ENCOUNTER — Ambulatory Visit (HOSPITAL_COMMUNITY): Payer: Medicare Other

## 2012-08-10 ENCOUNTER — Encounter (HOSPITAL_COMMUNITY): Payer: Self-pay

## 2012-08-15 ENCOUNTER — Ambulatory Visit (HOSPITAL_COMMUNITY): Payer: Medicare Other

## 2012-08-15 ENCOUNTER — Encounter (HOSPITAL_COMMUNITY)
Admission: RE | Admit: 2012-08-15 | Discharge: 2012-08-15 | Disposition: A | Discharge status: Home / Self Care | Payer: Self-pay | Source: Ambulatory Visit | Attending: Internal Medicine | Admitting: Internal Medicine

## 2012-08-17 ENCOUNTER — Encounter (HOSPITAL_COMMUNITY): Payer: Medicare Other | Attending: Internal Medicine

## 2012-08-17 ENCOUNTER — Ambulatory Visit (HOSPITAL_COMMUNITY): Payer: Medicare Other

## 2012-08-17 DIAGNOSIS — Z5189 Encounter for other specified aftercare: Secondary | ICD-10-CM | POA: Insufficient documentation

## 2012-08-17 DIAGNOSIS — J4489 Other specified chronic obstructive pulmonary disease: Secondary | ICD-10-CM | POA: Insufficient documentation

## 2012-08-17 DIAGNOSIS — J984 Other disorders of lung: Secondary | ICD-10-CM | POA: Insufficient documentation

## 2012-08-17 DIAGNOSIS — R0602 Shortness of breath: Secondary | ICD-10-CM | POA: Insufficient documentation

## 2012-08-17 DIAGNOSIS — J449 Chronic obstructive pulmonary disease, unspecified: Secondary | ICD-10-CM | POA: Insufficient documentation

## 2012-08-22 ENCOUNTER — Encounter (HOSPITAL_COMMUNITY): Payer: Medicare Other

## 2012-08-22 ENCOUNTER — Ambulatory Visit (HOSPITAL_COMMUNITY): Payer: Medicare Other

## 2012-08-24 ENCOUNTER — Encounter (HOSPITAL_COMMUNITY): Payer: Medicare Other

## 2012-08-24 ENCOUNTER — Ambulatory Visit (HOSPITAL_COMMUNITY): Payer: Medicare Other

## 2012-08-29 ENCOUNTER — Encounter (HOSPITAL_COMMUNITY): Payer: Medicare Other

## 2012-08-29 ENCOUNTER — Ambulatory Visit (HOSPITAL_COMMUNITY): Payer: Medicare Other

## 2012-08-31 ENCOUNTER — Ambulatory Visit (HOSPITAL_COMMUNITY): Payer: Medicare Other

## 2012-08-31 ENCOUNTER — Encounter (HOSPITAL_COMMUNITY): Payer: Medicare Other

## 2012-09-05 ENCOUNTER — Encounter (HOSPITAL_COMMUNITY): Payer: Medicare Other

## 2012-09-05 ENCOUNTER — Ambulatory Visit (HOSPITAL_COMMUNITY): Payer: Medicare Other

## 2012-09-07 ENCOUNTER — Encounter (HOSPITAL_COMMUNITY): Payer: Medicare Other

## 2012-09-07 ENCOUNTER — Ambulatory Visit (HOSPITAL_COMMUNITY): Payer: Medicare Other

## 2012-09-12 ENCOUNTER — Encounter (HOSPITAL_COMMUNITY): Payer: Medicare Other

## 2012-09-12 ENCOUNTER — Ambulatory Visit (HOSPITAL_COMMUNITY): Payer: Medicare Other

## 2012-09-14 ENCOUNTER — Encounter (HOSPITAL_COMMUNITY): Payer: Medicare Other

## 2012-09-14 ENCOUNTER — Ambulatory Visit (HOSPITAL_COMMUNITY): Payer: Medicare Other

## 2012-09-19 ENCOUNTER — Ambulatory Visit (HOSPITAL_COMMUNITY): Payer: Medicare Other

## 2012-09-19 ENCOUNTER — Encounter (HOSPITAL_COMMUNITY): Payer: Self-pay

## 2012-09-19 DIAGNOSIS — J984 Other disorders of lung: Secondary | ICD-10-CM | POA: Insufficient documentation

## 2012-09-19 DIAGNOSIS — R0602 Shortness of breath: Secondary | ICD-10-CM | POA: Insufficient documentation

## 2012-09-19 DIAGNOSIS — J4489 Other specified chronic obstructive pulmonary disease: Secondary | ICD-10-CM | POA: Insufficient documentation

## 2012-09-19 DIAGNOSIS — Z5189 Encounter for other specified aftercare: Secondary | ICD-10-CM | POA: Insufficient documentation

## 2012-09-19 DIAGNOSIS — J449 Chronic obstructive pulmonary disease, unspecified: Secondary | ICD-10-CM | POA: Insufficient documentation

## 2012-09-21 ENCOUNTER — Encounter (HOSPITAL_COMMUNITY): Payer: Self-pay

## 2012-09-21 ENCOUNTER — Ambulatory Visit (HOSPITAL_COMMUNITY): Payer: Medicare Other

## 2012-09-21 ENCOUNTER — Encounter: Payer: Self-pay | Admitting: Cardiovascular Disease

## 2012-09-21 DIAGNOSIS — R7301 Impaired fasting glucose: Secondary | ICD-10-CM | POA: Diagnosis not present

## 2012-09-21 DIAGNOSIS — I10 Essential (primary) hypertension: Secondary | ICD-10-CM | POA: Diagnosis not present

## 2012-09-21 DIAGNOSIS — I471 Supraventricular tachycardia: Secondary | ICD-10-CM | POA: Diagnosis not present

## 2012-09-25 DIAGNOSIS — J449 Chronic obstructive pulmonary disease, unspecified: Secondary | ICD-10-CM | POA: Diagnosis not present

## 2012-09-25 DIAGNOSIS — I10 Essential (primary) hypertension: Secondary | ICD-10-CM | POA: Diagnosis not present

## 2012-09-25 DIAGNOSIS — Z1331 Encounter for screening for depression: Secondary | ICD-10-CM | POA: Diagnosis not present

## 2012-09-25 DIAGNOSIS — E7800 Pure hypercholesterolemia, unspecified: Secondary | ICD-10-CM | POA: Diagnosis not present

## 2012-09-26 ENCOUNTER — Encounter (HOSPITAL_COMMUNITY)
Admission: RE | Admit: 2012-09-26 | Discharge: 2012-09-26 | Disposition: A | Discharge status: Home / Self Care | Payer: Self-pay | Source: Ambulatory Visit | Attending: Internal Medicine | Admitting: Internal Medicine

## 2012-09-26 ENCOUNTER — Ambulatory Visit (HOSPITAL_COMMUNITY): Payer: Medicare Other

## 2012-09-28 ENCOUNTER — Ambulatory Visit (HOSPITAL_COMMUNITY): Payer: Medicare Other

## 2012-09-28 ENCOUNTER — Encounter (HOSPITAL_COMMUNITY): Payer: Self-pay

## 2012-10-03 ENCOUNTER — Encounter (HOSPITAL_COMMUNITY): Payer: Self-pay

## 2012-10-03 ENCOUNTER — Ambulatory Visit (HOSPITAL_COMMUNITY): Payer: Medicare Other

## 2012-10-05 ENCOUNTER — Ambulatory Visit (HOSPITAL_COMMUNITY): Payer: Medicare Other

## 2012-10-05 ENCOUNTER — Encounter (HOSPITAL_COMMUNITY): Payer: Self-pay

## 2012-10-10 ENCOUNTER — Encounter (HOSPITAL_COMMUNITY)
Admission: RE | Admit: 2012-10-10 | Discharge: 2012-10-10 | Disposition: A | Discharge status: Home / Self Care | Payer: Self-pay | Source: Ambulatory Visit | Attending: Internal Medicine | Admitting: Internal Medicine

## 2012-10-10 ENCOUNTER — Ambulatory Visit (HOSPITAL_COMMUNITY): Payer: Medicare Other

## 2012-10-12 ENCOUNTER — Encounter (HOSPITAL_COMMUNITY)
Admission: RE | Admit: 2012-10-12 | Discharge: 2012-10-12 | Disposition: A | Discharge status: Home / Self Care | Payer: Self-pay | Source: Ambulatory Visit | Attending: Internal Medicine | Admitting: Internal Medicine

## 2012-10-12 ENCOUNTER — Ambulatory Visit (HOSPITAL_COMMUNITY): Payer: Medicare Other

## 2012-10-17 ENCOUNTER — Encounter (HOSPITAL_COMMUNITY): Payer: Self-pay

## 2012-10-17 ENCOUNTER — Ambulatory Visit (HOSPITAL_COMMUNITY): Payer: Medicare Other

## 2012-10-17 DIAGNOSIS — J449 Chronic obstructive pulmonary disease, unspecified: Secondary | ICD-10-CM | POA: Insufficient documentation

## 2012-10-17 DIAGNOSIS — R0602 Shortness of breath: Secondary | ICD-10-CM | POA: Insufficient documentation

## 2012-10-17 DIAGNOSIS — J4489 Other specified chronic obstructive pulmonary disease: Secondary | ICD-10-CM | POA: Insufficient documentation

## 2012-10-17 DIAGNOSIS — Z5189 Encounter for other specified aftercare: Secondary | ICD-10-CM | POA: Insufficient documentation

## 2012-10-17 DIAGNOSIS — J984 Other disorders of lung: Secondary | ICD-10-CM | POA: Insufficient documentation

## 2012-10-19 ENCOUNTER — Ambulatory Visit (HOSPITAL_COMMUNITY): Payer: Medicare Other

## 2012-10-19 ENCOUNTER — Encounter (HOSPITAL_COMMUNITY)
Admission: RE | Admit: 2012-10-19 | Discharge: 2012-10-19 | Disposition: A | Discharge status: Home / Self Care | Payer: Self-pay | Source: Ambulatory Visit | Attending: Internal Medicine | Admitting: Internal Medicine

## 2012-10-24 ENCOUNTER — Encounter (HOSPITAL_COMMUNITY)
Admission: RE | Admit: 2012-10-24 | Discharge: 2012-10-24 | Disposition: A | Discharge status: Home / Self Care | Payer: Self-pay | Source: Ambulatory Visit | Attending: Internal Medicine | Admitting: Internal Medicine

## 2012-10-24 ENCOUNTER — Ambulatory Visit (HOSPITAL_COMMUNITY): Payer: Medicare Other

## 2012-10-26 ENCOUNTER — Ambulatory Visit (HOSPITAL_COMMUNITY): Payer: Medicare Other

## 2012-10-26 ENCOUNTER — Encounter (HOSPITAL_COMMUNITY)
Admission: RE | Admit: 2012-10-26 | Discharge: 2012-10-26 | Disposition: A | Discharge status: Home / Self Care | Payer: Self-pay | Source: Ambulatory Visit | Attending: Internal Medicine | Admitting: Internal Medicine

## 2012-10-31 ENCOUNTER — Ambulatory Visit (HOSPITAL_COMMUNITY): Payer: Medicare Other

## 2012-10-31 ENCOUNTER — Encounter (HOSPITAL_COMMUNITY): Payer: Self-pay

## 2012-11-01 ENCOUNTER — Encounter: Payer: Self-pay | Admitting: Cardiovascular Disease

## 2012-11-02 ENCOUNTER — Encounter: Payer: Self-pay | Admitting: Internal Medicine

## 2012-11-02 ENCOUNTER — Ambulatory Visit (HOSPITAL_COMMUNITY): Payer: Medicare Other

## 2012-11-02 ENCOUNTER — Ambulatory Visit (INDEPENDENT_AMBULATORY_CARE_PROVIDER_SITE_OTHER): Payer: Medicare Other | Admitting: Internal Medicine

## 2012-11-02 ENCOUNTER — Encounter (HOSPITAL_COMMUNITY)
Admission: RE | Admit: 2012-11-02 | Discharge: 2012-11-02 | Disposition: A | Discharge status: Home / Self Care | Payer: Self-pay | Source: Ambulatory Visit | Attending: Internal Medicine | Admitting: Internal Medicine

## 2012-11-02 VITALS — BP 118/66 | HR 76 | Ht 69.0 in | Wt 187.2 lb

## 2012-11-02 DIAGNOSIS — J984 Other disorders of lung: Secondary | ICD-10-CM

## 2012-11-02 DIAGNOSIS — R918 Other nonspecific abnormal finding of lung field: Secondary | ICD-10-CM | POA: Diagnosis not present

## 2012-11-02 DIAGNOSIS — J449 Chronic obstructive pulmonary disease, unspecified: Secondary | ICD-10-CM | POA: Diagnosis not present

## 2012-11-02 MED ORDER — ACLIDINIUM BROMIDE 400 MCG/ACT IN AEPB: 1.0000 | INHALATION_SPRAY | Freq: Two times a day (BID) | RESPIRATORY_TRACT | Status: DC

## 2012-11-02 MED ORDER — FLUTICASONE FUROATE-VILANTEROL 100-25 MCG/INH IN AEPB: 1.0000 | INHALATION_SPRAY | Freq: Every day | RESPIRATORY_TRACT | Status: DC

## 2012-11-02 NOTE — Progress Notes (Signed)
Patient ID: [name], male    DOB: 06-13-1935, 78 y.o.   MRN: [id number] PCP Dr Hulan Fess HPI 01/25/11- 24 yo M former smoker followed for COPD/ emphysema with recent concern for pulmonary nodules. Last here May 20, 2010- note reviewed. CT done during the winter showed nodules.  PET positive nodule Left lung faded before needle bx. He had gone to [facility] for second opinion w/ Dr [name] where severity of his advanced lung disease was emphasized.  lung volume reduction and transplant were mentioned although I don't know how seriously, given his age.[name] He comes today to update and to have conference including his wife:  Conference on overall status: He had concern about amount of radiation from imaging and I reminded him that is why we chose to do CXR 05/20/10 which showed stable COPD. PFT 07/01/05 - FEV1 1.13/ 0.39; FEV1/FVC 0.25; DLCO 0.52; insignif response to bronchodilator. PFT at [facility] 11/26/10- FEV1 0.82/ 0.25; FEV1/FVC 0.22 Quality of life and prognosis discussed. He has noted some decline over past year. He could walk through [employer] without O2 then and no longer can. Emphysema pattern with little cough, wheeze, phlegm. No chest pain, palpitation or ankle edema. After long discussion we agreed to an inquiry to [facility] Transplant coordinator for consideration. I told him his age made acceptance unlikely. We discussed limited role of meds,He has had Pneumovax, annual flu vax, Pulmonary Rehab, and test normal for a1AT in the past. Has O2 2l/m for sleep and exertion, home nebulizer. He asks another script for prednisone to hold and we discussed side effects.   05/20/11 78 yo M former smoker followed for COPD/ emphysema with recent concern for pulmonary nodules. .. wife here He and his wife come today for an extended review of his early experience with [facility] lung transplant team evaluation. PFT- [facility] 04/27/11- FEV1 0.78/23%, FEV1/FVC 0.27, TLC 98%, RV 163%, DLCO 37%. ABG-room air-04/27/2011  pH 7.42, PCO2 40, PO2 63, bicarbonate 26, O2 saturation 91.9% 6 minute walk test 04/27/2011: 314.6 m (60% to distance predicted) with significant dyspnea while wearing oxygen at 3 L. He came away with significant reservations about the prospects for transplant. He asked for my estimate of his life expectancy without transplant. I suggested that barring an intervening acute illness, an additional 5 years is possible. He understands this is a very loose estimate. I deferred questions about hepatitis B vaccination and testosterone replacement to his primary physician. He wanted to know what else we might do to test and treat him if he chose not to seek transplant. We will watch his lung nodules with chest x-ray and/or CT, but our ability to intervene would be limited. If he does not have transplant, I don't know that cardiac stress test or repeat pulmonary function tests would add much useful  information unless symptoms change.  10/20/11- 26 yo M former smoker followed for COPD/ emphysema with recent concern for pulmonary nodules [facility] CXR 04/27/11- COPD with no mention of nodules. Followup at [facility] pulmonary has been deferred until September. They want stress test. He is leaning away from transplant at this time of blood and he wanted my thoughts. Discussed his left upper lobe nodule, atherosclerosis on CT scan in April of 2012. He continues pulmonary rehabilitation. Oxygen saturation on 3 L is 96% during exercise. He feels his current life quality is okay. Denies chest pain, acute discomfort, edema. Little cough or phlegm.  05/04/12-  41 yo M former smoker followed for COPD/ emphysema with  recent concern for pulmonary nodules [facility] CXR 04/27/11- COPD with no mention of nodules. Sob same, cough yellow to clear, no wheezing, no fcs He talked about another meeting with the [facility] transplant doctors. At this point he is less interested in lung transplant as an option. Echocardiogram 12/16/2011 showed EF 65-70%, no  pulmonary hypertension, grade 1 diastolic dysfunction. [name] increased nocturia. Oxygen is used with sleep and exertion  11/02/12- 4 yo M former smoker followed for COPD/ emphysema with recent concern for pulmonary nodules FOLLOWS FOR: states breathing is about the same since last visit; would like [name] inhaler sample ( his current insurance will cover); also needs refill for [facility] HFA. Stable through the winter with no acute infection. Remains on oxygen now, especially with exertion. Has used Spiriva but would like to try [place] again. a1AT normal MM. CT 05/19/12- images reviewed again with him IMPRESSION:  1. Left upper lobe nodule is stable from 12/11/2010. Additional  follow-up in one year is recommended to document 2 years of  stability. This recommendation follows the consensus statement:  Guidelines for Management of Small Pulmonary Nodules Detected on CT  Scans: A Statement from the Fleischner Society as published in  Radiology 2005; 237:395-400.  2. Additional scattered pulmonary nodules are stable and therefore  considered benign.  3. Previously seen left upper lobe subpleural airspace  consolidation has resolved in the interval.  Original Report Authenticated By: [name], M.D.   Review of Systems-see HPI Constitutional:   No-   weight loss, night sweats, fevers, chills, fatigue, lassitude. HEENT:   No-  headaches, difficulty swallowing, tooth/dental problems, sore throat,       No-  sneezing, itching, ear ache, nasal congestion, post nasal drip,  CV:  No-   chest pain, orthopnea, PND, swelling in lower extremities, anasarca, dizziness, palpitations Resp: + shortness of breath with exertion, not at rest.              No-   productive cough,  very little non-productive cough,  No-  coughing up of blood.              No-   change in color of mucus.  No- wheezing.   Skin: No-   rash or lesions. GI:  No-   heartburn, indigestion, abdominal pain, nausea,   GU:  No-   dysuria,  MS:  No-   joint pain or swelling.  Neuro-  Psych:  No- change in mood or affect. No acute depression or anxiety.  No memory loss.   OBJ- BP 118/66  Pulse 76  Ht 5\' 9"  (1.753 m)  Wt 187 lb 3.2 oz (84.913 kg)  BMI 27.63 kg/m2  SpO2 98%  General- Alert, Oriented, Affect-appropriate, Distress- none acute. He brought his oxygen but sat without wearing it again.  Skin- rash-none, lesions- none, excoriation- none Lymphadenopathy- none Head- atraumatic            Eyes- Gross vision intact, PERRLA, conjunctivae clear secretions            Ears- Hearing, canals-normal            Nose- Clear, no-Septal dev, mucus, polyps, erosion, perforation             Throat- Mallampati II , mucosa clear , drainage- none, tonsils- atrophic Neck- flexible , trachea midline, no stridor , thyroid nl, carotid no bruit Chest - symmetrical excursion , unlabored           Heart/CV- RRR , no murmur ,  no gallop  , no rub, nl s1 s2                           - JVD- none , edema+1, stasis changes- none, varices- none           Lung- clear but very distant, unlabored, cough- none , dullness-none, rub- none           Chest wall-  Abd-  Br/ Gen/ Rectal- Not done, not indicated Extrem- cyanosis- none, clubbing, none, atrophy- none, strength- nl Neuro- grossly intact to observation

## 2012-11-02 NOTE — Patient Instructions (Addendum)
Order- Future noncontrast CT chest      Dx lung nodules      To be done Oct, 2014  Sample [name]   1 puff, twice daily    Try this instead of Spiriva   Sample Breo Ellipta   1 puff and rinse, one time daily     Try this instead of Symbicort   Please call as needed

## 2012-11-07 ENCOUNTER — Ambulatory Visit (HOSPITAL_COMMUNITY): Payer: Medicare Other

## 2012-11-07 ENCOUNTER — Encounter (HOSPITAL_COMMUNITY)
Admission: RE | Admit: 2012-11-07 | Discharge: 2012-11-07 | Disposition: A | Discharge status: Home / Self Care | Payer: Self-pay | Source: Ambulatory Visit | Attending: Internal Medicine | Admitting: Internal Medicine

## 2012-11-08 ENCOUNTER — Telehealth: Payer: Self-pay | Admitting: Internal Medicine

## 2012-11-08 ENCOUNTER — Encounter: Payer: Self-pay | Admitting: Internal Medicine

## 2012-11-08 MED ORDER — ALBUTEROL SULFATE HFA 108 (90 BASE) MCG/ACT IN AERS: 2.0000 | INHALATION_SPRAY | Freq: Four times a day (QID) | RESPIRATORY_TRACT | Status: DC | PRN

## 2012-11-08 NOTE — Telephone Encounter (Signed)
Pt is aware rx sent. Nothing further needed  °

## 2012-11-08 NOTE — Assessment & Plan Note (Signed)
For one more year of followup with noncontrast CT in October, 2014

## 2012-11-08 NOTE — Assessment & Plan Note (Addendum)
He has decided not to have transplant Plan-remain active, continue oxygen. Prescription Titus Mould Ellipta

## 2012-11-08 NOTE — Telephone Encounter (Signed)
Patient returning call.

## 2012-11-08 NOTE — Telephone Encounter (Signed)
lmomtcb x1 for pt 

## 2012-11-09 ENCOUNTER — Encounter (HOSPITAL_COMMUNITY)
Admission: RE | Admit: 2012-11-09 | Discharge: 2012-11-09 | Disposition: A | Discharge status: Home / Self Care | Payer: Self-pay | Source: Ambulatory Visit | Attending: Internal Medicine | Admitting: Internal Medicine

## 2012-11-14 ENCOUNTER — Encounter (HOSPITAL_COMMUNITY)
Admission: RE | Admit: 2012-11-14 | Discharge: 2012-11-14 | Disposition: A | Discharge status: Home / Self Care | Payer: Self-pay | Source: Ambulatory Visit | Attending: Internal Medicine | Admitting: Internal Medicine

## 2012-11-14 DIAGNOSIS — R0602 Shortness of breath: Secondary | ICD-10-CM | POA: Insufficient documentation

## 2012-11-14 DIAGNOSIS — J449 Chronic obstructive pulmonary disease, unspecified: Secondary | ICD-10-CM | POA: Insufficient documentation

## 2012-11-14 DIAGNOSIS — J984 Other disorders of lung: Secondary | ICD-10-CM | POA: Insufficient documentation

## 2012-11-14 DIAGNOSIS — Z5189 Encounter for other specified aftercare: Secondary | ICD-10-CM | POA: Insufficient documentation

## 2012-11-14 DIAGNOSIS — J4489 Other specified chronic obstructive pulmonary disease: Secondary | ICD-10-CM | POA: Insufficient documentation

## 2012-11-16 ENCOUNTER — Encounter (HOSPITAL_COMMUNITY)
Admission: RE | Admit: 2012-11-16 | Discharge: 2012-11-16 | Disposition: A | Discharge status: Home / Self Care | Payer: Self-pay | Source: Ambulatory Visit | Attending: Internal Medicine | Admitting: Internal Medicine

## 2012-11-21 ENCOUNTER — Encounter (HOSPITAL_COMMUNITY)
Admission: RE | Admit: 2012-11-21 | Discharge: 2012-11-21 | Disposition: A | Discharge status: Home / Self Care | Payer: Self-pay | Source: Ambulatory Visit | Attending: Internal Medicine | Admitting: Internal Medicine

## 2012-11-23 ENCOUNTER — Encounter (HOSPITAL_COMMUNITY)
Admission: RE | Admit: 2012-11-23 | Discharge: 2012-11-23 | Disposition: A | Discharge status: Home / Self Care | Payer: Self-pay | Source: Ambulatory Visit | Attending: Internal Medicine | Admitting: Internal Medicine

## 2012-11-27 ENCOUNTER — Encounter: Payer: Self-pay | Admitting: Internal Medicine

## 2012-11-28 ENCOUNTER — Ambulatory Visit (INDEPENDENT_AMBULATORY_CARE_PROVIDER_SITE_OTHER): Payer: Medicare Other | Admitting: Cardiovascular Disease

## 2012-11-28 ENCOUNTER — Encounter: Payer: Self-pay | Admitting: Cardiovascular Disease

## 2012-11-28 ENCOUNTER — Other Ambulatory Visit: Payer: Self-pay

## 2012-11-28 ENCOUNTER — Encounter (HOSPITAL_COMMUNITY)
Admission: RE | Admit: 2012-11-28 | Discharge: 2012-11-28 | Disposition: A | Discharge status: Home / Self Care | Payer: Self-pay | Source: Ambulatory Visit | Attending: Internal Medicine | Admitting: Internal Medicine

## 2012-11-28 VITALS — BP 143/59 | HR 73 | Ht 69.0 in | Wt 182.0 lb

## 2012-11-28 DIAGNOSIS — I2510 Atherosclerotic heart disease of native coronary artery without angina pectoris: Secondary | ICD-10-CM | POA: Diagnosis not present

## 2012-11-28 NOTE — Patient Instructions (Addendum)
Your physician wants you to follow-up in:  12 months.  You will receive a reminder letter in the mail two months in advance. If you don't receive a letter, please call our office to schedule the follow-up appointment.   

## 2012-11-28 NOTE — Progress Notes (Signed)
HPI:  78 year old gentleman presenting for followup evaluation. He has been diagnosed with subclinical atherosclerosis based on CT imaging of the chest. Coronary calcifications were noted. There is also mild to moderate atherosclerotic calcification of the aorta. He underwent cardiac catheterization in 2003 that demonstrated nonobstructive coronary disease with the exception of an 80% ostial stenosis of a small ramus intermedius branch. His left ventricular function has been normal. He had an echocardiogram last year that showed normal LV function with grade 1 diastolic dysfunction, moderate tricuspid regurgitation, and normal RV function with normal pulmonary pressures.  The patient has advanced lung disease. He is on 24/7 home oxygen. He had considered for lung transplantation but has opted against this. He is significantly limited from his shortness of breath. He denies chest pain, chest pressure, or heart palpitations. He notes easy bruising and wonders about his need to continue on aspirin. He is also treated for hypertension and dyslipidemia.  Outpatient Encounter Prescriptions as of 11/28/2012  Medication Sig Dispense Refill  . albuterol (PROAIR HFA) 108 (90 BASE) MCG/ACT inhaler Inhale 2 puffs into the lungs 4 (four) times daily as needed for wheezing or shortness of breath.  1 Inhaler  5  . albuterol (PROVENTIL) (2.5 MG/3ML) 0.083% nebulizer solution Take 2.5 mg by nebulization every 6 (six) hours as needed.        [name] amLODipine (NORVASC) 5 MG tablet Take 5 mg by mouth daily.        [name] aspirin 81 MG tablet Take 81 mg by mouth daily.        . budesonide-formoterol (SYMBICORT) 160-4.5 MCG/ACT inhaler Inhale 2 puffs into the lungs 2 (two) times daily.  1 Inhaler  4  . ciprofloxacin (CIPRO) 500 MG tablet as needed.      . digoxin (LANOXIN) 0.25 MG tablet Take 250 mcg by mouth daily.        . finasteride (PROSCAR) 5 MG tablet Take 5 mg by mouth every other day.        . losartan (COZAAR) 50 MG  tablet Take 1 tablet by mouth Daily.      . Multiple Vitamin (MULTIVITAMIN) capsule Take 1 capsule by mouth daily.        . pravastatin (PRAVACHOL) 40 MG tablet Take 1 tablet by mouth daily.      [name] tiotropium (SPIRIVA HANDIHALER) 18 MCG inhalation capsule Place 1 capsule (18 mcg total) into inhaler and inhale daily.  30 capsule  5  . [DISCONTINUED] Aclidinium Bromide ([name] PRESSAIR) 400 MCG/ACT AEPB Inhale 1 puff into the lungs 2 (two) times daily.  1 each  0  . [DISCONTINUED] Fluticasone Furoate-Vilanterol (BREO ELLIPTA) 100-25 MCG/INH AEPB Inhale 1 puff into the lungs daily.  1 each  0   No facility-administered encounter medications on file as of 11/28/2012.    Allergies  Allergen Reactions  . Tolectin (Tolmetin Sodium) Other (See Comments)    BP low and passed out  . Montelukast Sodium     Past Medical History  Diagnosis Date  . COPD (chronic obstructive pulmonary disease)   . Allergic rhinitis, cause unspecified   . Pulmonary nodule   . Emphysema of lung     ROS: Negative except as per HPI  BP 143/59  Pulse 73  Ht 5\' 9"  (1.753 m)  Wt 82.555 kg (182 lb)  BMI 26.86 kg/m2  PHYSICAL EXAM: Pt is alert and oriented, NAD, pleasant male on oxygen by nasal cannula HEENT: normal Neck: JVP - normal, carotids 2+= without bruits Lungs:  CTA bilaterally CV: RRR without murmur or gallop Abd: soft, NT, Positive BS, no hepatomegaly Ext: trace ankle edema bilaterally, distal pulses intact and equal Skin: warm/dry no rash  EKG:  Normal sinus rhythm 73 beats per minute , sinus arrhythmia, nonspecific ST abnormality  ASSESSMENT AND PLAN: 1. Coronary artery disease, native vessel. The patient has no anginal symptoms. I recommended that he remain on aspirin 81 mg daily for preventative measures considering his known CAD, hyperlipidemia, and hypertension. He is agreeable with this.  2. Remote SVT. We discussed whether he needed to remain on digoxin. He had an episode of SVT over 30  years ago. He's been on digoxin ever since. After discussion of the pros and cons, he has elected to continue on this medication.  3. Hypertension. Blood pressure is well controlled on his current medical program. He does have a mild ankle edema potentially related to amlodipine. This does not bother him much and he will continue on his same program.  4. Hyperlipidemia. Lipid panel from Dr. Darrol Poke office reviewed. His lipids are under excellent control and his LFTs are within normal limits.  For followup I will see him back in one year.  Tonny Bollman 11/28/2012 4:53 PM

## 2012-11-30 ENCOUNTER — Encounter (HOSPITAL_COMMUNITY): Payer: Self-pay

## 2012-12-05 ENCOUNTER — Encounter (HOSPITAL_COMMUNITY)
Admission: RE | Admit: 2012-12-05 | Discharge: 2012-12-05 | Disposition: A | Discharge status: Home / Self Care | Payer: Self-pay | Source: Ambulatory Visit | Attending: Internal Medicine | Admitting: Internal Medicine

## 2012-12-07 ENCOUNTER — Encounter (HOSPITAL_COMMUNITY)
Admission: RE | Admit: 2012-12-07 | Discharge: 2012-12-07 | Disposition: A | Discharge status: Home / Self Care | Payer: Self-pay | Source: Ambulatory Visit | Attending: Internal Medicine | Admitting: Internal Medicine

## 2012-12-12 ENCOUNTER — Encounter (HOSPITAL_COMMUNITY)
Admission: RE | Admit: 2012-12-12 | Discharge: 2012-12-12 | Disposition: A | Discharge status: Home / Self Care | Payer: Self-pay | Source: Ambulatory Visit | Attending: Internal Medicine | Admitting: Internal Medicine

## 2012-12-13 ENCOUNTER — Ambulatory Visit: Payer: Medicare Other | Admitting: Cardiovascular Disease

## 2012-12-14 ENCOUNTER — Telehealth: Payer: Self-pay | Admitting: Internal Medicine

## 2012-12-14 ENCOUNTER — Encounter (HOSPITAL_COMMUNITY)
Admission: RE | Admit: 2012-12-14 | Discharge: 2012-12-14 | Disposition: A | Discharge status: Home / Self Care | Payer: Self-pay | Source: Ambulatory Visit | Attending: Internal Medicine | Admitting: Internal Medicine

## 2012-12-14 DIAGNOSIS — R0602 Shortness of breath: Secondary | ICD-10-CM | POA: Insufficient documentation

## 2012-12-14 DIAGNOSIS — Z5189 Encounter for other specified aftercare: Secondary | ICD-10-CM | POA: Insufficient documentation

## 2012-12-14 DIAGNOSIS — J4489 Other specified chronic obstructive pulmonary disease: Secondary | ICD-10-CM | POA: Insufficient documentation

## 2012-12-14 DIAGNOSIS — J449 Chronic obstructive pulmonary disease, unspecified: Secondary | ICD-10-CM | POA: Insufficient documentation

## 2012-12-14 DIAGNOSIS — J984 Other disorders of lung: Secondary | ICD-10-CM | POA: Insufficient documentation

## 2012-12-14 NOTE — Progress Notes (Signed)
[name] came in today for exercise in Pulmonary Rehab.  Has increased cough today, he states he has not been able to get much mucus and it is clear.   He does note he is having more difficulty breathing today, oxygen sats are down 1-2 % today with exercise. Feels more tired today, breath sounds distant,  ? Diminished in RLL. Dr. Roxy Cedar office called and he has appointment for 12/15/12.   [name] RN

## 2012-12-14 NOTE — Telephone Encounter (Signed)
I spoke with [name] (who called not rose). She stated pt seems to be getting congested a little and sats are a little down than normal. I advised her Dr. Maple Hudson did not have any openings today but did tomorrow at 11 if she felt he was okay to wait until then. She was fine with that appt was scheduled. Nothing further was needed

## 2012-12-15 ENCOUNTER — Ambulatory Visit (INDEPENDENT_AMBULATORY_CARE_PROVIDER_SITE_OTHER): Payer: Medicare Other | Admitting: Internal Medicine

## 2012-12-15 ENCOUNTER — Encounter: Payer: Self-pay | Admitting: Internal Medicine

## 2012-12-15 VITALS — BP 128/60 | HR 83 | Ht 69.0 in | Wt 184.0 lb

## 2012-12-15 DIAGNOSIS — J984 Other disorders of lung: Secondary | ICD-10-CM | POA: Diagnosis not present

## 2012-12-15 DIAGNOSIS — J438 Other emphysema: Secondary | ICD-10-CM

## 2012-12-15 DIAGNOSIS — J439 Emphysema, unspecified: Secondary | ICD-10-CM

## 2012-12-15 MED ORDER — AZITHROMYCIN 250 MG PO TABS: ORAL_TABLET | ORAL | Status: DC

## 2012-12-15 MED ORDER — PREDNISONE (PAK) 10 MG PO TABS: ORAL_TABLET | ORAL | Status: DC

## 2012-12-15 NOTE — Patient Instructions (Addendum)
Scripts sent for prednisone and Zpak/ azithromycin  Fluids and mucinex to this the mucus  Please call as needed  We plan CT before next visit in October

## 2012-12-15 NOTE — Progress Notes (Signed)
Patient ID: [name], male    DOB: 06-13-1935, 78 y.o.   MRN: [id number] PCP Dr [name] HPI 01/25/11- 24 yo M former smoker followed for COPD/ emphysema with recent concern for pulmonary nodules. Last here May 20, 2010- note reviewed. CT done during the winter showed nodules.  PET positive nodule Left lung faded before needle bx. He had gone to [facility] for second opinion w/ Dr [name] where severity of his advanced lung disease was emphasized.  lung volume reduction and transplant were mentioned although I don't know how seriously, given his age.[name] He comes today to update and to have conference including his wife:  Conference on overall status: He had concern about amount of radiation from imaging and I reminded him that is why we chose to do CXR 05/20/10 which showed stable COPD. PFT 07/01/05 - FEV1 1.13/ 0.39; FEV1/FVC 0.25; DLCO 0.52; insignif response to bronchodilator. PFT at [facility] 11/26/10- FEV1 0.82/ 0.25; FEV1/FVC 0.22 Quality of life and prognosis discussed. He has noted some decline over past year. He could walk through [employer] without O2 then and no longer can. Emphysema pattern with little cough, wheeze, phlegm. No chest pain, palpitation or ankle edema. After long discussion we agreed to an inquiry to [facility] Transplant coordinator for consideration. I told him his age made acceptance unlikely. We discussed limited role of meds,He has had Pneumovax, annual flu vax, Pulmonary Rehab, and test normal for a1AT in the past. Has O2 2l/m for sleep and exertion, home nebulizer. He asks another script for prednisone to hold and we discussed side effects.   05/20/11 78 yo M former smoker followed for COPD/ emphysema with recent concern for pulmonary nodules. .. wife here He and his wife come today for an extended review of his early experience with [facility] lung transplant team evaluation. PFT- [facility] 04/27/11- FEV1 0.78/23%, FEV1/FVC 0.27, TLC 98%, RV 163%, DLCO 37%. ABG-room air-04/27/2011  pH 7.42, PCO2 40, PO2 63, bicarbonate 26, O2 saturation 91.9% 6 minute walk test 04/27/2011: 314.6 m (60% to distance predicted) with significant dyspnea while wearing oxygen at 3 L. He came away with significant reservations about the prospects for transplant. He asked for my estimate of his life expectancy without transplant. I suggested that barring an intervening acute illness, an additional 5 years is possible. He understands this is a very loose estimate. I deferred questions about hepatitis B vaccination and testosterone replacement to his primary physician. He wanted to know what else we might do to test and treat him if he chose not to seek transplant. We will watch his lung nodules with chest x-ray and/or CT, but our ability to intervene would be limited. If he does not have transplant, I don't know that cardiac stress test or repeat pulmonary function tests would add much useful  information unless symptoms change.  10/20/11- 26 yo M former smoker followed for COPD/ emphysema with recent concern for pulmonary nodules [facility] CXR 04/27/11- COPD with no mention of nodules. Followup at [facility] pulmonary has been deferred until September. They want stress test. He is leaning away from transplant at this time of blood and he wanted my thoughts. Discussed his left upper lobe nodule, atherosclerosis on CT scan in April of 2012. He continues pulmonary rehabilitation. Oxygen saturation on 3 L is 96% during exercise. He feels his current life quality is okay. Denies chest pain, acute discomfort, edema. Little cough or phlegm.  05/04/12-  41 yo M former smoker followed for COPD/ emphysema with  recent concern for pulmonary nodules [facility] CXR 04/27/11- COPD with no mention of nodules. Sob same, cough yellow to clear, no wheezing, no fcs He talked about another meeting with the [facility] transplant doctors. At this point he is less interested in lung transplant as an option. Echocardiogram 12/16/2011 showed EF 65-70%, no  pulmonary hypertension, grade 1 diastolic dysfunction. [name] increased nocturia. Oxygen is used with sleep and exertion  11/02/12- 19 yo M former smoker followed for COPD/ emphysema with recent concern for pulmonary nodules FOLLOWS FOR: states breathing is about the same since last visit; would like [name] inhaler sample ( his current insurance will cover); also needs refill for [facility] HFA. Stable through the winter with no acute infection. Remains on oxygen now, especially with exertion. Has used Spiriva but would like to try [place] again. a1AT normal MM. CT 05/19/12- images reviewed again with him IMPRESSION:  1. Left upper lobe nodule is stable from 12/11/2010. Additional  follow-up in one year is recommended to document 2 years of  stability. This recommendation follows the consensus statement:  Guidelines for Management of Small Pulmonary Nodules Detected on CT  Scans: A Statement from the Fleischner Society as published in  Radiology 2005; 237:395-400.  2. Additional scattered pulmonary nodules are stable and therefore  considered benign.  3. Previously seen left upper lobe subpleural airspace  consolidation has resolved in the interval.  Original Report Authenticated By: [name], M.D.  12/15/12- 14 yo M former smoker followed for COPD/ emphysema with recent concern for pulmonary nodules FOLLOWS FOR: pt reports x 2wks had a scratchy throat, nonprod cough, fatigue -- statrted taking mucinex x3days seems to help some [name] at pulmonary rehabilitation sent him here. O2 3L/ [employer]  Review of Systems-see HPI Constitutional:   No-   weight loss, night sweats, fevers, chills, +fatigue, lassitude. HEENT:   No-  headaches, difficulty swallowing, tooth/dental problems, sore throat,       No-  sneezing, itching, ear ache, nasal congestion, post nasal drip,  CV:  No-   chest pain, orthopnea, PND, swelling in lower extremities, anasarca, dizziness, palpitations Resp: + shortness  of breath with exertion, not at rest.              No-   productive cough,  + non-productive cough,  No-  coughing up of blood.              No-   change in color of mucus.  No- wheezing.   Skin: No-   rash or lesions. GI:  No-   heartburn, indigestion, abdominal pain, nausea,   GU: No-   dysuria,  MS:  No-   joint pain or swelling.  Neuro-  Psych:  No- change in mood or affect. No acute depression or anxiety.  No memory loss.   OBJ- General- Alert, Oriented, Affect-appropriate, Distress- none acute. O2 3L Skin- rash-none, lesions- none, excoriation- none Lymphadenopathy- none Head- atraumatic            Eyes- Gross vision intact, PERRLA, conjunctivae clear secretions            Ears- Hearing, canals-normal            Nose- Clear, no-Septal dev, mucus, polyps, erosion, perforation             Throat- Mallampati II , mucosa clear , drainage- none, tonsils- atrophic. + Hoarse Neck- flexible , trachea midline, no stridor , thyroid nl, carotid no bruit Chest - symmetrical excursion , unlabored  Heart/CV- RRR , no murmur , no gallop  , no rub, nl s1 s2                           - JVD- none , edema+1, stasis changes- none, varices- none           Lung- clear but very distant, unlabored, cough- none , dullness-none, rub- none           Chest wall-  Abd-  Br/ Gen/ Rectal- Not done, not indicated Extrem- cyanosis- none, clubbing, none, atrophy- none, strength- nl Neuro- grossly intact to observation

## 2012-12-18 DIAGNOSIS — N400 Enlarged prostate without lower urinary tract symptoms: Secondary | ICD-10-CM | POA: Diagnosis not present

## 2012-12-19 ENCOUNTER — Encounter (HOSPITAL_COMMUNITY)
Admission: RE | Admit: 2012-12-19 | Discharge: 2012-12-19 | Disposition: A | Discharge status: Home / Self Care | Payer: Self-pay | Source: Ambulatory Visit | Attending: Internal Medicine | Admitting: Internal Medicine

## 2012-12-21 ENCOUNTER — Encounter (HOSPITAL_COMMUNITY)
Admission: RE | Admit: 2012-12-21 | Discharge: 2012-12-21 | Disposition: A | Discharge status: Home / Self Care | Payer: Self-pay | Source: Ambulatory Visit | Attending: Internal Medicine | Admitting: Internal Medicine

## 2012-12-22 DIAGNOSIS — N400 Enlarged prostate without lower urinary tract symptoms: Secondary | ICD-10-CM | POA: Diagnosis not present

## 2012-12-22 DIAGNOSIS — N529 Male erectile dysfunction, unspecified: Secondary | ICD-10-CM | POA: Diagnosis not present

## 2012-12-22 DIAGNOSIS — R972 Elevated prostate specific antigen [PSA]: Secondary | ICD-10-CM | POA: Diagnosis not present

## 2012-12-22 NOTE — Telephone Encounter (Signed)
Error

## 2012-12-26 ENCOUNTER — Encounter (HOSPITAL_COMMUNITY)
Admission: RE | Admit: 2012-12-26 | Discharge: 2012-12-26 | Disposition: A | Discharge status: Home / Self Care | Payer: Self-pay | Source: Ambulatory Visit | Attending: Internal Medicine | Admitting: Internal Medicine

## 2012-12-26 NOTE — Assessment & Plan Note (Signed)
Acute exacerbation consistent with a viral infection. He is at risk for progressive bronchitis. Plan-Z-Pak, prednisone taper

## 2012-12-26 NOTE — Assessment & Plan Note (Signed)
Plan-followup chest CT as discussed previously.

## 2012-12-28 ENCOUNTER — Encounter (HOSPITAL_COMMUNITY)
Admission: RE | Admit: 2012-12-28 | Discharge: 2012-12-28 | Disposition: A | Discharge status: Home / Self Care | Payer: Self-pay | Source: Ambulatory Visit | Attending: Internal Medicine | Admitting: Internal Medicine

## 2013-01-02 ENCOUNTER — Encounter (HOSPITAL_COMMUNITY): Payer: Self-pay

## 2013-01-04 ENCOUNTER — Encounter (HOSPITAL_COMMUNITY): Payer: Self-pay

## 2013-01-07 ENCOUNTER — Telehealth: Payer: Self-pay | Admitting: Pulmonary Disease

## 2013-01-07 NOTE — Telephone Encounter (Signed)
Got back from beach, T 101, breathing slight worse, no cough, satn ok, no URI, no burning micturition. Recom - if fever persists or if yellow phlegm, then take cipro Prednisone only for wheezing Call back or ER if worse or if HR >120

## 2013-01-09 ENCOUNTER — Encounter (HOSPITAL_COMMUNITY): Admission: RE | Admit: 2013-01-09 | Payer: Self-pay | Source: Ambulatory Visit

## 2013-01-09 DIAGNOSIS — B9789 Other viral agents as the cause of diseases classified elsewhere: Secondary | ICD-10-CM | POA: Diagnosis not present

## 2013-01-11 ENCOUNTER — Telehealth: Payer: Self-pay | Admitting: Internal Medicine

## 2013-01-11 ENCOUNTER — Encounter (HOSPITAL_COMMUNITY): Payer: Self-pay

## 2013-01-11 ENCOUNTER — Ambulatory Visit (INDEPENDENT_AMBULATORY_CARE_PROVIDER_SITE_OTHER): Payer: Medicare Other | Admitting: Internal Medicine

## 2013-01-11 ENCOUNTER — Other Ambulatory Visit (INDEPENDENT_AMBULATORY_CARE_PROVIDER_SITE_OTHER): Payer: Medicare Other

## 2013-01-11 ENCOUNTER — Encounter: Payer: Self-pay | Admitting: Internal Medicine

## 2013-01-11 ENCOUNTER — Ambulatory Visit (INDEPENDENT_AMBULATORY_CARE_PROVIDER_SITE_OTHER)
Admission: RE | Admit: 2013-01-11 | Discharge: 2013-01-11 | Disposition: A | Discharge status: Home / Self Care | Payer: Medicare Other | Source: Ambulatory Visit | Attending: Internal Medicine | Admitting: Internal Medicine

## 2013-01-11 VITALS — BP 120/60 | HR 106 | Temp 101.1°F | Ht 69.0 in | Wt 181.0 lb

## 2013-01-11 DIAGNOSIS — J441 Chronic obstructive pulmonary disease with (acute) exacerbation: Secondary | ICD-10-CM

## 2013-01-11 DIAGNOSIS — J984 Other disorders of lung: Secondary | ICD-10-CM | POA: Diagnosis not present

## 2013-01-11 MED ORDER — AZITHROMYCIN 250 MG PO TABS: ORAL_TABLET | ORAL | Status: DC

## 2013-01-11 MED ORDER — PREDNISONE (PAK) 10 MG PO TABS: ORAL_TABLET | ORAL | Status: DC

## 2013-01-11 NOTE — Telephone Encounter (Signed)
Spoke with pt He is c/o fever for the past 5 days- between 101 and 102 His breathing is more labored OK to add at 4 pm today per [facility] Appt scheduled

## 2013-01-11 NOTE — Progress Notes (Signed)
Patient ID: [name], male    DOB: 06-13-1935, 78 y.o.   MRN: [id number] PCP Dr Hulan Fess HPI 01/25/11- 24 yo M former smoker followed for COPD/ emphysema with recent concern for pulmonary nodules. Last here May 20, 2010- note reviewed. CT done during the winter showed nodules.  PET positive nodule Left lung faded before needle bx. He had gone to [facility] for second opinion w/ Dr [name] where severity of his advanced lung disease was emphasized.  lung volume reduction and transplant were mentioned although I don't know how seriously, given his age.[name] He comes today to update and to have conference including his wife:  Conference on overall status: He had concern about amount of radiation from imaging and I reminded him that is why we chose to do CXR 05/20/10 which showed stable COPD. PFT 07/01/05 - FEV1 1.13/ 0.39; FEV1/FVC 0.25; DLCO 0.52; insignif response to bronchodilator. PFT at [facility] 11/26/10- FEV1 0.82/ 0.25; FEV1/FVC 0.22 Quality of life and prognosis discussed. He has noted some decline over past year. He could walk through [employer] without O2 then and no longer can. Emphysema pattern with little cough, wheeze, phlegm. No chest pain, palpitation or ankle edema. After long discussion we agreed to an inquiry to [facility] Transplant coordinator for consideration. I told him his age made acceptance unlikely. We discussed limited role of meds,He has had Pneumovax, annual flu vax, Pulmonary Rehab, and test normal for a1AT in the past. Has O2 2l/m for sleep and exertion, home nebulizer. He asks another script for prednisone to hold and we discussed side effects.   05/20/11 78 yo M former smoker followed for COPD/ emphysema with recent concern for pulmonary nodules. .. wife here He and his wife come today for an extended review of his early experience with [facility] lung transplant team evaluation. PFT- [facility] 04/27/11- FEV1 0.78/23%, FEV1/FVC 0.27, TLC 98%, RV 163%, DLCO 37%. ABG-room air-04/27/2011  pH 7.42, PCO2 40, PO2 63, bicarbonate 26, O2 saturation 91.9% 6 minute walk test 04/27/2011: 314.6 m (60% to distance predicted) with significant dyspnea while wearing oxygen at 3 L. He came away with significant reservations about the prospects for transplant. He asked for my estimate of his life expectancy without transplant. I suggested that barring an intervening acute illness, an additional 5 years is possible. He understands this is a very loose estimate. I deferred questions about hepatitis B vaccination and testosterone replacement to his primary physician. He wanted to know what else we might do to test and treat him if he chose not to seek transplant. We will watch his lung nodules with chest x-ray and/or CT, but our ability to intervene would be limited. If he does not have transplant, I don't know that cardiac stress test or repeat pulmonary function tests would add much useful  information unless symptoms change.  10/20/11- 26 yo M former smoker followed for COPD/ emphysema with recent concern for pulmonary nodules [facility] CXR 04/27/11- COPD with no mention of nodules. Followup at [facility] pulmonary has been deferred until September. They want stress test. He is leaning away from transplant at this time of blood and he wanted my thoughts. Discussed his left upper lobe nodule, atherosclerosis on CT scan in April of 2012. He continues pulmonary rehabilitation. Oxygen saturation on 3 L is 96% during exercise. He feels his current life quality is okay. Denies chest pain, acute discomfort, edema. Little cough or phlegm.  05/04/12-  41 yo M former smoker followed for COPD/ emphysema with  recent concern for pulmonary nodules [facility] CXR 04/27/11- COPD with no mention of nodules. Sob same, cough yellow to clear, no wheezing, no fcs He talked about another meeting with the [facility] transplant doctors. At this point he is less interested in lung transplant as an option. Echocardiogram 12/16/2011 showed EF 65-70%, no  pulmonary hypertension, grade 1 diastolic dysfunction. [name] increased nocturia. Oxygen is used with sleep and exertion  11/02/12- 56 yo M former smoker followed for COPD/ emphysema with recent concern for pulmonary nodules FOLLOWS FOR: states breathing is about the same since last visit; would like [name] inhaler sample ( his current insurance will cover); also needs refill for [facility] HFA. Stable through the winter with no acute infection. Remains on oxygen now, especially with exertion. Has used Spiriva but would like to try [place] again. a1AT normal MM. CT 05/19/12- images reviewed again with him IMPRESSION:  1. Left upper lobe nodule is stable from 12/11/2010. Additional  follow-up in one year is recommended to document 2 years of  stability. This recommendation follows the consensus statement:  Guidelines for Management of Small Pulmonary Nodules Detected on CT  Scans: A Statement from the Fleischner Society as published in  Radiology 2005; 237:395-400.  2. Additional scattered pulmonary nodules are stable and therefore  considered benign.  3. Previously seen left upper lobe subpleural airspace  consolidation has resolved in the interval.  Original Report Authenticated By: [name], M.D.  12/15/12- 51 yo M former smoker followed for COPD/ emphysema with recent concern for pulmonary nodules FOLLOWS FOR: pt reports x 2wks had a scratchy throat, nonprod cough, fatigue -- statrted taking mucinex x3days seems to help some [name] at pulmonary rehabilitation sent him here. O2 3L/ [employer]  01/11/13 78 yo M former smoker followed for COPD/ emphysema with recent concern for pulmonary nodules   Wife here ACUTE VISIT: fevers, increased SOB, no energy, slight sore throat (not bad), cough-mostly dry.Pain in left side-unsure if from cough. At beach last week. Feels ill. May have gotten chills. Now fever x5 days, 101-102. Increased his oxygen to 4 L. Short of breath, no energy, mild sore  throat, dry cough, tussive left chest pain. No dysuria or nausea. Saw PCP on Tuesday, suspected viral infection.  Review of Systems-see HPI Constitutional:   No-   weight loss, night sweats, +fevers, no-chills, +fatigue, lassitude. HEENT:   No-  headaches, difficulty swallowing, tooth/dental problems, +sore throat,       No-  sneezing, itching, ear ache, nasal congestion, post nasal drip,  CV:  +chest pain, no-orthopnea, PND, swelling in lower extremities, anasarca, dizziness, palpitations Resp: + shortness of breath with exertion, not at rest.              No-   productive cough,  + non-productive cough,  No-  coughing up of blood.              No-   change in color of mucus.  No- wheezing.   Skin: No-   rash or lesions. GI:  No-   heartburn, indigestion, abdominal pain, nausea,   GU: No-   dysuria,  MS:  No-   joint pain or swelling.  Neuro-  Psych:  No- change in mood or affect. No acute depression or anxiety.  No memory loss.   OBJ- BP 120/60  Pulse 106  Temp(Src) 101.1 F (38.4 C) (Oral)  Ht 5\' 9"  (1.753 m)  Wt 181 lb (82.101 kg)  BMI 26.72 kg/m2  SpO2 92% General- Alert,  Oriented, Affect-appropriate, Distress- none acute. O2 3L Skin- rash-none, lesions- none, excoriation- none Lymphadenopathy- none Head- atraumatic            Eyes- Gross vision intact, PERRLA, conjunctivae clear secretions            Ears- Hearing, canals-normal            Nose- Clear, no-Septal dev, mucus, polyps, erosion, perforation             Throat- Mallampati II , mucosa clear , drainage- none, tonsils- atrophic. + Hoarse Neck- flexible , trachea midline, no stridor , thyroid nl, carotid no bruit Chest - symmetrical excursion , unlabored           Heart/CV- RRR , no murmur , no gallop  , no rub, nl s1 s2                           - JVD- none , edema+1-2, stasis changes- none, varices- none           Lung- +crackles/ very distant sounds, unlabored, cough- none , dullness-none, rub- none            Chest wall-  Abd-  Br/ Gen/ Rectal- Not done, not indicated Extrem- cyanosis- none, clubbing, none, atrophy- none, strength- nl. Neg Homan's Neuro- grossly intact to observation

## 2013-01-11 NOTE — Patient Instructions (Addendum)
Scripts sent for Z pak and prednisone  Order- CXR dx Acute exacerbation of COPD             Lab- CBC  Please call as needed

## 2013-01-12 ENCOUNTER — Encounter: Payer: Self-pay | Admitting: *Deleted

## 2013-01-12 ENCOUNTER — Other Ambulatory Visit: Payer: Self-pay | Admitting: Internal Medicine

## 2013-01-12 LAB — CBC WITH DIFFERENTIAL/PLATELET
Basophils Absolute: 0 10*3/uL (ref 0.0–0.1)
Basophils Relative: 0.3 % (ref 0.0–3.0)
Eosinophils Absolute: 0.6 10*3/uL (ref 0.0–0.7)
Eosinophils Relative: 4.5 % (ref 0.0–5.0)
HCT: 38.8 % — ABNORMAL LOW (ref 39.0–52.0)
Hemoglobin: 12.9 g/dL — ABNORMAL LOW (ref 13.0–17.0)
Lymphocytes Relative: 7.4 % — ABNORMAL LOW (ref 12.0–46.0)
Lymphs Abs: 1.1 10*3/uL (ref 0.7–4.0)
MCHC: 33.2 g/dL (ref 30.0–36.0)
MCV: 84.7 fl (ref 78.0–100.0)
Monocytes Absolute: 0.9 10*3/uL (ref 0.1–1.0)
Monocytes Relative: 6.3 % (ref 3.0–12.0)
Neutro Abs: 11.6 10*3/uL — ABNORMAL HIGH (ref 1.4–7.7)
Neutrophils Relative %: 81.5 % — ABNORMAL HIGH (ref 43.0–77.0)
Platelets: 306 10*3/uL (ref 150.0–400.0)
RBC: 4.58 Mil/uL (ref 4.22–5.81)
RDW: 14.4 % (ref 11.5–14.6)
WBC: 14.2 10*3/uL — ABNORMAL HIGH (ref 4.5–10.5)

## 2013-01-12 MED ORDER — AMOXICILLIN-POT CLAVULANATE 875-125 MG PO TABS: 1.0000 | ORAL_TABLET | Freq: Two times a day (BID) | ORAL | Status: DC

## 2013-01-12 NOTE — Progress Notes (Signed)
Quick Note:  Pt aware of results. Rx sent as well. ______

## 2013-01-15 ENCOUNTER — Telehealth: Payer: Self-pay | Admitting: Internal Medicine

## 2013-01-15 NOTE — Telephone Encounter (Signed)
I spoke with pt. Pt stated he is feeling better. Breathing is not quit back as before but is doing much better since starting ABX for the PNA. He is wanting to know if there is a F/U needed since he did have PNA in left lung. Please advise Dr. Maple Hudson thanks  Last OV 01/11/13 Pending 06/05/13

## 2013-01-15 NOTE — Progress Notes (Signed)
Quick Note:  Pt already spoken to-see phone note on 01-15-13 ______

## 2013-01-15 NOTE — Telephone Encounter (Signed)
Per CY-if he continues to improve; he suggests a follow up outpt CXR in 2 weeks. Thanks.

## 2013-01-15 NOTE — Telephone Encounter (Signed)
PT AWARE  

## 2013-01-16 ENCOUNTER — Encounter (HOSPITAL_COMMUNITY): Payer: Self-pay

## 2013-01-16 DIAGNOSIS — Z5189 Encounter for other specified aftercare: Secondary | ICD-10-CM | POA: Insufficient documentation

## 2013-01-16 DIAGNOSIS — J449 Chronic obstructive pulmonary disease, unspecified: Secondary | ICD-10-CM | POA: Insufficient documentation

## 2013-01-16 DIAGNOSIS — R0602 Shortness of breath: Secondary | ICD-10-CM | POA: Insufficient documentation

## 2013-01-16 DIAGNOSIS — J984 Other disorders of lung: Secondary | ICD-10-CM | POA: Insufficient documentation

## 2013-01-16 DIAGNOSIS — J4489 Other specified chronic obstructive pulmonary disease: Secondary | ICD-10-CM | POA: Insufficient documentation

## 2013-01-18 ENCOUNTER — Telehealth (HOSPITAL_COMMUNITY): Payer: Self-pay | Admitting: *Deleted

## 2013-01-18 ENCOUNTER — Encounter (HOSPITAL_COMMUNITY): Admission: RE | Admit: 2013-01-18 | Payer: Self-pay | Source: Ambulatory Visit

## 2013-01-18 NOTE — Telephone Encounter (Signed)
[name] call on 01/17/13 to inform us of his recent absence was due to illness.  Will return next week.

## 2013-01-21 DIAGNOSIS — J441 Chronic obstructive pulmonary disease with (acute) exacerbation: Secondary | ICD-10-CM | POA: Insufficient documentation

## 2013-01-21 NOTE — Assessment & Plan Note (Addendum)
Significant bronchitis or pneumonia. Plan-chest x-ray, Z-Pak, prednisone taper, fluids

## 2013-01-23 ENCOUNTER — Encounter (HOSPITAL_COMMUNITY): Payer: Self-pay

## 2013-01-24 ENCOUNTER — Telehealth: Payer: Self-pay | Admitting: Internal Medicine

## 2013-01-24 DIAGNOSIS — J441 Chronic obstructive pulmonary disease with (acute) exacerbation: Secondary | ICD-10-CM

## 2013-01-24 NOTE — Telephone Encounter (Signed)
Patient would like to know if he needs a follow up and chest xray I informed patient that per June 2 telephone note: [name], CMA at 01/15/2013 4:45 PM   Status: Signed            Per CY-if he continues to improve; he suggests a follow up outpt CXR in 2 weeks. Thanks.    Per OV note 01/11/13: Patient needs f/u as needed Patient Instructions    Scripts sent for Z pak and prednisone  Order- CXR dx Acute exacerbation of COPD  Lab- CBC  Please call as needed   Patient verbalized understanding, will come next week (2 weeks from June 2 phone note) and we will call him when results are available. Nothing further at this time

## 2013-01-25 ENCOUNTER — Encounter (HOSPITAL_COMMUNITY)
Admission: RE | Admit: 2013-01-25 | Discharge: 2013-01-25 | Disposition: A | Discharge status: Home / Self Care | Payer: Self-pay | Source: Ambulatory Visit | Attending: Internal Medicine | Admitting: Internal Medicine

## 2013-01-29 ENCOUNTER — Ambulatory Visit (INDEPENDENT_AMBULATORY_CARE_PROVIDER_SITE_OTHER)
Admission: RE | Admit: 2013-01-29 | Discharge: 2013-01-29 | Disposition: A | Discharge status: Home / Self Care | Payer: Medicare Other | Source: Ambulatory Visit | Attending: Internal Medicine | Admitting: Internal Medicine

## 2013-01-29 DIAGNOSIS — J441 Chronic obstructive pulmonary disease with (acute) exacerbation: Secondary | ICD-10-CM | POA: Diagnosis not present

## 2013-01-29 DIAGNOSIS — J449 Chronic obstructive pulmonary disease, unspecified: Secondary | ICD-10-CM | POA: Diagnosis not present

## 2013-01-30 ENCOUNTER — Encounter (HOSPITAL_COMMUNITY)
Admission: RE | Admit: 2013-01-30 | Discharge: 2013-01-30 | Disposition: A | Discharge status: Home / Self Care | Payer: Self-pay | Source: Ambulatory Visit | Attending: Internal Medicine | Admitting: Internal Medicine

## 2013-01-31 ENCOUNTER — Telehealth: Payer: Self-pay | Admitting: Internal Medicine

## 2013-01-31 NOTE — Telephone Encounter (Signed)
Notes Recorded by Waymon Budge, MD on 01/30/2013 at 9:01 AM CXR- good clearing of pneumonia  I spoke with patient about results and he verbalized understanding and had no questions. He wants to know if dr. Maple Hudson feels like he is okay tot ravel to [place] next week? Also he will check with his PCP to see if he has a PNA vaccine or not and get back with [place]. Please advise Dr. Maple Hudson thanks  Pt aware Dr. Maple Hudson out of the office this afternoon.

## 2013-01-31 NOTE — Telephone Encounter (Signed)
Ok to travel, just take it easy.

## 2013-02-01 ENCOUNTER — Encounter (HOSPITAL_COMMUNITY)
Admission: RE | Admit: 2013-02-01 | Discharge: 2013-02-01 | Disposition: A | Discharge status: Home / Self Care | Payer: Self-pay | Source: Ambulatory Visit | Attending: Internal Medicine | Admitting: Internal Medicine

## 2013-02-01 NOTE — Telephone Encounter (Signed)
lmomtcb x1 

## 2013-02-01 NOTE — Telephone Encounter (Signed)
Pt aware. Nothing further needed 

## 2013-02-06 ENCOUNTER — Telehealth: Payer: Self-pay | Admitting: Internal Medicine

## 2013-02-06 ENCOUNTER — Encounter (HOSPITAL_COMMUNITY): Payer: Self-pay

## 2013-02-06 NOTE — Telephone Encounter (Signed)
Pt is aware of CY recommendations.

## 2013-02-06 NOTE — Telephone Encounter (Signed)
Spoke to the pt. He states that his BP was checked twice last week and it was fine. Wants to know what else CY has to recommend.

## 2013-02-06 NOTE — Telephone Encounter (Signed)
Per CY---  Could he go by PCP and get his BP checked.

## 2013-02-06 NOTE — Telephone Encounter (Signed)
Per CY---  Just go with routine and wait.

## 2013-02-06 NOTE — Telephone Encounter (Signed)
Pt treated for PNA 3 weeks ago.  Finished Zpak, Augmentin and Pred taper.  Repeat cxr showed PNA was better.  Pt still c/o weakness and decreased energy, HA's and light headedness.  Pt had started [place] and took for 4 days and stopped because he thought this may be causing symptoms.  Pt has bee off  [name] for 4 days now.  Please advise.

## 2013-02-07 DIAGNOSIS — R5383 Other fatigue: Secondary | ICD-10-CM | POA: Diagnosis not present

## 2013-02-07 DIAGNOSIS — J441 Chronic obstructive pulmonary disease with (acute) exacerbation: Secondary | ICD-10-CM | POA: Diagnosis not present

## 2013-02-07 DIAGNOSIS — R5381 Other malaise: Secondary | ICD-10-CM | POA: Diagnosis not present

## 2013-02-08 ENCOUNTER — Encounter (HOSPITAL_COMMUNITY): Payer: Self-pay

## 2013-02-13 ENCOUNTER — Encounter (HOSPITAL_COMMUNITY): Payer: Self-pay

## 2013-02-13 DIAGNOSIS — J449 Chronic obstructive pulmonary disease, unspecified: Secondary | ICD-10-CM | POA: Insufficient documentation

## 2013-02-13 DIAGNOSIS — R0602 Shortness of breath: Secondary | ICD-10-CM | POA: Insufficient documentation

## 2013-02-13 DIAGNOSIS — J984 Other disorders of lung: Secondary | ICD-10-CM | POA: Insufficient documentation

## 2013-02-13 DIAGNOSIS — J4489 Other specified chronic obstructive pulmonary disease: Secondary | ICD-10-CM | POA: Insufficient documentation

## 2013-02-13 DIAGNOSIS — Z5189 Encounter for other specified aftercare: Secondary | ICD-10-CM | POA: Insufficient documentation

## 2013-02-15 ENCOUNTER — Encounter (HOSPITAL_COMMUNITY)
Admission: RE | Admit: 2013-02-15 | Discharge: 2013-02-15 | Disposition: A | Discharge status: Home / Self Care | Payer: Self-pay | Source: Ambulatory Visit | Attending: Internal Medicine | Admitting: Internal Medicine

## 2013-02-20 ENCOUNTER — Encounter (HOSPITAL_COMMUNITY)
Admission: RE | Admit: 2013-02-20 | Discharge: 2013-02-20 | Disposition: A | Discharge status: Home / Self Care | Payer: Self-pay | Source: Ambulatory Visit | Attending: Internal Medicine | Admitting: Internal Medicine

## 2013-02-22 ENCOUNTER — Encounter (HOSPITAL_COMMUNITY)
Admission: RE | Admit: 2013-02-22 | Discharge: 2013-02-22 | Disposition: A | Discharge status: Home / Self Care | Payer: Self-pay | Source: Ambulatory Visit | Attending: Internal Medicine | Admitting: Internal Medicine

## 2013-02-27 ENCOUNTER — Encounter (HOSPITAL_COMMUNITY)
Admission: RE | Admit: 2013-02-27 | Discharge: 2013-02-27 | Disposition: A | Discharge status: Home / Self Care | Payer: Self-pay | Source: Ambulatory Visit | Attending: Internal Medicine | Admitting: Internal Medicine

## 2013-03-01 ENCOUNTER — Encounter (HOSPITAL_COMMUNITY)
Admission: RE | Admit: 2013-03-01 | Discharge: 2013-03-01 | Disposition: A | Discharge status: Home / Self Care | Payer: Self-pay | Source: Ambulatory Visit | Attending: Internal Medicine | Admitting: Internal Medicine

## 2013-03-06 ENCOUNTER — Encounter (HOSPITAL_COMMUNITY)
Admission: RE | Admit: 2013-03-06 | Discharge: 2013-03-06 | Disposition: A | Discharge status: Home / Self Care | Payer: Self-pay | Source: Ambulatory Visit | Attending: Internal Medicine | Admitting: Internal Medicine

## 2013-03-08 ENCOUNTER — Encounter (HOSPITAL_COMMUNITY)
Admission: RE | Admit: 2013-03-08 | Discharge: 2013-03-08 | Disposition: A | Discharge status: Home / Self Care | Payer: Self-pay | Source: Ambulatory Visit | Attending: Internal Medicine | Admitting: Internal Medicine

## 2013-03-13 ENCOUNTER — Encounter (HOSPITAL_COMMUNITY)
Admission: RE | Admit: 2013-03-13 | Discharge: 2013-03-13 | Disposition: A | Discharge status: Home / Self Care | Payer: Self-pay | Source: Ambulatory Visit | Attending: Internal Medicine | Admitting: Internal Medicine

## 2013-03-13 DIAGNOSIS — R5381 Other malaise: Secondary | ICD-10-CM | POA: Diagnosis not present

## 2013-03-13 DIAGNOSIS — R5383 Other fatigue: Secondary | ICD-10-CM | POA: Diagnosis not present

## 2013-03-13 DIAGNOSIS — R7989 Other specified abnormal findings of blood chemistry: Secondary | ICD-10-CM | POA: Diagnosis not present

## 2013-03-15 ENCOUNTER — Encounter (HOSPITAL_COMMUNITY): Payer: Self-pay

## 2013-03-20 ENCOUNTER — Encounter (HOSPITAL_COMMUNITY)
Admission: RE | Admit: 2013-03-20 | Discharge: 2013-03-20 | Disposition: A | Discharge status: Home / Self Care | Payer: Self-pay | Source: Ambulatory Visit | Attending: Internal Medicine | Admitting: Internal Medicine

## 2013-03-20 DIAGNOSIS — J449 Chronic obstructive pulmonary disease, unspecified: Secondary | ICD-10-CM | POA: Insufficient documentation

## 2013-03-20 DIAGNOSIS — Z5189 Encounter for other specified aftercare: Secondary | ICD-10-CM | POA: Insufficient documentation

## 2013-03-20 DIAGNOSIS — J4489 Other specified chronic obstructive pulmonary disease: Secondary | ICD-10-CM | POA: Insufficient documentation

## 2013-03-20 DIAGNOSIS — R0602 Shortness of breath: Secondary | ICD-10-CM | POA: Insufficient documentation

## 2013-03-20 DIAGNOSIS — J984 Other disorders of lung: Secondary | ICD-10-CM | POA: Insufficient documentation

## 2013-03-21 ENCOUNTER — Other Ambulatory Visit: Payer: Self-pay

## 2013-03-22 ENCOUNTER — Encounter (HOSPITAL_COMMUNITY)
Admission: RE | Admit: 2013-03-22 | Discharge: 2013-03-22 | Disposition: A | Discharge status: Home / Self Care | Payer: Self-pay | Source: Ambulatory Visit | Attending: Internal Medicine | Admitting: Internal Medicine

## 2013-03-27 ENCOUNTER — Encounter (HOSPITAL_COMMUNITY)
Admission: RE | Admit: 2013-03-27 | Discharge: 2013-03-27 | Disposition: A | Discharge status: Home / Self Care | Payer: Self-pay | Source: Ambulatory Visit | Attending: Internal Medicine | Admitting: Internal Medicine

## 2013-03-29 ENCOUNTER — Encounter (HOSPITAL_COMMUNITY)
Admission: RE | Admit: 2013-03-29 | Discharge: 2013-03-29 | Disposition: A | Discharge status: Home / Self Care | Payer: Self-pay | Source: Ambulatory Visit | Attending: Internal Medicine | Admitting: Internal Medicine

## 2013-04-03 ENCOUNTER — Encounter (HOSPITAL_COMMUNITY)
Admission: RE | Admit: 2013-04-03 | Discharge: 2013-04-03 | Disposition: A | Discharge status: Home / Self Care | Payer: Self-pay | Source: Ambulatory Visit | Attending: Internal Medicine | Admitting: Internal Medicine

## 2013-04-05 ENCOUNTER — Encounter (HOSPITAL_COMMUNITY)
Admission: RE | Admit: 2013-04-05 | Discharge: 2013-04-05 | Disposition: A | Discharge status: Home / Self Care | Payer: Self-pay | Source: Ambulatory Visit | Attending: Internal Medicine | Admitting: Internal Medicine

## 2013-04-10 ENCOUNTER — Encounter (HOSPITAL_COMMUNITY)
Admission: RE | Admit: 2013-04-10 | Discharge: 2013-04-10 | Disposition: A | Discharge status: Home / Self Care | Payer: Self-pay | Source: Ambulatory Visit | Attending: Internal Medicine | Admitting: Internal Medicine

## 2013-04-12 ENCOUNTER — Encounter (HOSPITAL_COMMUNITY)
Admission: RE | Admit: 2013-04-12 | Discharge: 2013-04-12 | Disposition: A | Discharge status: Home / Self Care | Payer: Self-pay | Source: Ambulatory Visit | Attending: Internal Medicine | Admitting: Internal Medicine

## 2013-04-17 ENCOUNTER — Encounter (HOSPITAL_COMMUNITY)
Admission: RE | Admit: 2013-04-17 | Discharge: 2013-04-17 | Disposition: A | Discharge status: Home / Self Care | Payer: Self-pay | Source: Ambulatory Visit | Attending: Internal Medicine | Admitting: Internal Medicine

## 2013-04-17 DIAGNOSIS — R0602 Shortness of breath: Secondary | ICD-10-CM | POA: Insufficient documentation

## 2013-04-17 DIAGNOSIS — J984 Other disorders of lung: Secondary | ICD-10-CM | POA: Insufficient documentation

## 2013-04-17 DIAGNOSIS — J449 Chronic obstructive pulmonary disease, unspecified: Secondary | ICD-10-CM | POA: Insufficient documentation

## 2013-04-17 DIAGNOSIS — J4489 Other specified chronic obstructive pulmonary disease: Secondary | ICD-10-CM | POA: Insufficient documentation

## 2013-04-17 DIAGNOSIS — Z5189 Encounter for other specified aftercare: Secondary | ICD-10-CM | POA: Insufficient documentation

## 2013-04-19 ENCOUNTER — Encounter (HOSPITAL_COMMUNITY)
Admission: RE | Admit: 2013-04-19 | Discharge: 2013-04-19 | Disposition: A | Discharge status: Home / Self Care | Payer: Self-pay | Source: Ambulatory Visit | Attending: Internal Medicine | Admitting: Internal Medicine

## 2013-04-24 ENCOUNTER — Encounter (HOSPITAL_COMMUNITY)
Admission: RE | Admit: 2013-04-24 | Discharge: 2013-04-24 | Disposition: A | Discharge status: Home / Self Care | Payer: Self-pay | Source: Ambulatory Visit | Attending: Internal Medicine | Admitting: Internal Medicine

## 2013-04-26 ENCOUNTER — Encounter (HOSPITAL_COMMUNITY)
Admission: RE | Admit: 2013-04-26 | Discharge: 2013-04-26 | Disposition: A | Discharge status: Home / Self Care | Payer: Self-pay | Source: Ambulatory Visit | Attending: Internal Medicine | Admitting: Internal Medicine

## 2013-05-01 ENCOUNTER — Encounter (HOSPITAL_COMMUNITY): Admission: RE | Admit: 2013-05-01 | Payer: Self-pay | Source: Ambulatory Visit

## 2013-05-03 ENCOUNTER — Encounter (HOSPITAL_COMMUNITY): Payer: Self-pay

## 2013-05-08 ENCOUNTER — Encounter (HOSPITAL_COMMUNITY)
Admission: RE | Admit: 2013-05-08 | Discharge: 2013-05-08 | Disposition: A | Discharge status: Home / Self Care | Payer: Self-pay | Source: Ambulatory Visit | Attending: Internal Medicine | Admitting: Internal Medicine

## 2013-05-10 ENCOUNTER — Encounter (HOSPITAL_COMMUNITY)
Admission: RE | Admit: 2013-05-10 | Discharge: 2013-05-10 | Disposition: A | Discharge status: Home / Self Care | Payer: Self-pay | Source: Ambulatory Visit | Attending: Internal Medicine | Admitting: Internal Medicine

## 2013-05-11 DIAGNOSIS — R7989 Other specified abnormal findings of blood chemistry: Secondary | ICD-10-CM | POA: Diagnosis not present

## 2013-05-11 DIAGNOSIS — Z23 Encounter for immunization: Secondary | ICD-10-CM | POA: Diagnosis not present

## 2013-05-15 ENCOUNTER — Encounter (HOSPITAL_COMMUNITY): Payer: Self-pay

## 2013-05-17 ENCOUNTER — Encounter (HOSPITAL_COMMUNITY)
Admission: RE | Admit: 2013-05-17 | Discharge: 2013-05-17 | Disposition: A | Discharge status: Home / Self Care | Payer: Self-pay | Source: Ambulatory Visit | Attending: Internal Medicine | Admitting: Internal Medicine

## 2013-05-17 ENCOUNTER — Ambulatory Visit (INDEPENDENT_AMBULATORY_CARE_PROVIDER_SITE_OTHER)
Admission: RE | Admit: 2013-05-17 | Discharge: 2013-05-17 | Disposition: A | Discharge status: Home / Self Care | Payer: Medicare Other | Source: Ambulatory Visit | Attending: Internal Medicine | Admitting: Internal Medicine

## 2013-05-17 DIAGNOSIS — R918 Other nonspecific abnormal finding of lung field: Secondary | ICD-10-CM | POA: Diagnosis not present

## 2013-05-17 DIAGNOSIS — J984 Other disorders of lung: Secondary | ICD-10-CM | POA: Insufficient documentation

## 2013-05-17 DIAGNOSIS — J438 Other emphysema: Secondary | ICD-10-CM | POA: Diagnosis not present

## 2013-05-17 DIAGNOSIS — R0602 Shortness of breath: Secondary | ICD-10-CM | POA: Insufficient documentation

## 2013-05-17 DIAGNOSIS — Z5189 Encounter for other specified aftercare: Secondary | ICD-10-CM | POA: Insufficient documentation

## 2013-05-17 DIAGNOSIS — J449 Chronic obstructive pulmonary disease, unspecified: Secondary | ICD-10-CM | POA: Insufficient documentation

## 2013-05-17 DIAGNOSIS — J4489 Other specified chronic obstructive pulmonary disease: Secondary | ICD-10-CM | POA: Insufficient documentation

## 2013-05-17 NOTE — Progress Notes (Signed)
Quick Note:  Advised pt of CT results per CY. Pt verbalized understanding and has no concerns or questions Pt will keep f/u appt ______

## 2013-05-22 ENCOUNTER — Encounter (HOSPITAL_COMMUNITY)
Admission: RE | Admit: 2013-05-22 | Discharge: 2013-05-22 | Disposition: A | Discharge status: Home / Self Care | Payer: Self-pay | Source: Ambulatory Visit | Attending: Internal Medicine | Admitting: Internal Medicine

## 2013-05-24 ENCOUNTER — Encounter (HOSPITAL_COMMUNITY): Payer: Medicare Other

## 2013-05-29 ENCOUNTER — Encounter (HOSPITAL_COMMUNITY): Payer: Medicare Other

## 2013-05-31 ENCOUNTER — Encounter (HOSPITAL_COMMUNITY): Payer: Medicare Other

## 2013-05-31 ENCOUNTER — Other Ambulatory Visit: Payer: Medicare Other

## 2013-06-01 ENCOUNTER — Other Ambulatory Visit: Payer: Medicare Other

## 2013-06-05 ENCOUNTER — Encounter (HOSPITAL_COMMUNITY)
Admission: RE | Admit: 2013-06-05 | Discharge: 2013-06-05 | Disposition: A | Discharge status: Home / Self Care | Payer: Self-pay | Source: Ambulatory Visit | Attending: Internal Medicine | Admitting: Internal Medicine

## 2013-06-05 ENCOUNTER — Encounter: Payer: Self-pay | Admitting: Internal Medicine

## 2013-06-05 ENCOUNTER — Ambulatory Visit (INDEPENDENT_AMBULATORY_CARE_PROVIDER_SITE_OTHER): Payer: Medicare Other | Admitting: Internal Medicine

## 2013-06-05 VITALS — BP 128/62 | HR 85 | Ht 69.0 in | Wt 180.4 lb

## 2013-06-05 DIAGNOSIS — J984 Other disorders of lung: Secondary | ICD-10-CM | POA: Diagnosis not present

## 2013-06-05 DIAGNOSIS — Z23 Encounter for immunization: Secondary | ICD-10-CM | POA: Diagnosis not present

## 2013-06-05 DIAGNOSIS — J438 Other emphysema: Secondary | ICD-10-CM | POA: Diagnosis not present

## 2013-06-05 DIAGNOSIS — J439 Emphysema, unspecified: Secondary | ICD-10-CM

## 2013-06-05 MED ORDER — FLUTICASONE FUROATE-VILANTEROL 100-25 MCG/INH IN AEPB: 1.0000 | INHALATION_SPRAY | Freq: Every day | RESPIRATORY_TRACT | Status: DC

## 2013-06-05 MED ORDER — BUDESONIDE-FORMOTEROL FUMARATE 160-4.5 MCG/ACT IN AERO: 2.0000 | INHALATION_SPRAY | Freq: Two times a day (BID) | RESPIRATORY_TRACT | Status: DC

## 2013-06-05 MED ORDER — AZITHROMYCIN 250 MG PO TABS: ORAL_TABLET | ORAL | Status: DC

## 2013-06-05 NOTE — Patient Instructions (Addendum)
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

## 2013-06-05 NOTE — Progress Notes (Signed)
Patient ID: [name], male    DOB: 06-13-1935, 78 y.o.   MRN: [id number] PCP Dr Hulan Fess HPI 01/25/11- 24 yo M former smoker followed for COPD/ emphysema with recent concern for pulmonary nodules. Last here May 20, 2010- note reviewed. CT done during the winter showed nodules.  PET positive nodule Left lung faded before needle bx. He had gone to [facility] for second opinion w/ Dr [name] where severity of his advanced lung disease was emphasized.  lung volume reduction and transplant were mentioned although I don't know how seriously, given his age.[name] He comes today to update and to have conference including his wife:  Conference on overall status: He had concern about amount of radiation from imaging and I reminded him that is why we chose to do CXR 05/20/10 which showed stable COPD. PFT 07/01/05 - FEV1 1.13/ 0.39; FEV1/FVC 0.25; DLCO 0.52; insignif response to bronchodilator. PFT at [facility] 11/26/10- FEV1 0.82/ 0.25; FEV1/FVC 0.22 Quality of life and prognosis discussed. He has noted some decline over past year. He could walk through [employer] without O2 then and no longer can. Emphysema pattern with little cough, wheeze, phlegm. No chest pain, palpitation or ankle edema. After long discussion we agreed to an inquiry to [facility] Transplant coordinator for consideration. I told him his age made acceptance unlikely. We discussed limited role of meds,He has had Pneumovax, annual flu vax, Pulmonary Rehab, and test normal for a1AT in the past. Has O2 2l/m for sleep and exertion, home nebulizer. He asks another script for prednisone to hold and we discussed side effects.   05/20/11 78 yo M former smoker followed for COPD/ emphysema with recent concern for pulmonary nodules. .. wife here He and his wife come today for an extended review of his early experience with [facility] lung transplant team evaluation. PFT- [facility] 04/27/11- FEV1 0.78/23%, FEV1/FVC 0.27, TLC 98%, RV 163%, DLCO 37%. ABG-room air-04/27/2011  pH 7.42, PCO2 40, PO2 63, bicarbonate 26, O2 saturation 91.9% 6 minute walk test 04/27/2011: 314.6 m (60% to distance predicted) with significant dyspnea while wearing oxygen at 3 L. He came away with significant reservations about the prospects for transplant. He asked for my estimate of his life expectancy without transplant. I suggested that barring an intervening acute illness, an additional 5 years is possible. He understands this is a very loose estimate. I deferred questions about hepatitis B vaccination and testosterone replacement to his primary physician. He wanted to know what else we might do to test and treat him if he chose not to seek transplant. We will watch his lung nodules with chest x-ray and/or CT, but our ability to intervene would be limited. If he does not have transplant, I don't know that cardiac stress test or repeat pulmonary function tests would add much useful  information unless symptoms change.  10/20/11- 26 yo M former smoker followed for COPD/ emphysema with recent concern for pulmonary nodules [facility] CXR 04/27/11- COPD with no mention of nodules. Followup at [facility] pulmonary has been deferred until September. They want stress test. He is leaning away from transplant at this time of blood and he wanted my thoughts. Discussed his left upper lobe nodule, atherosclerosis on CT scan in April of 2012. He continues pulmonary rehabilitation. Oxygen saturation on 3 L is 96% during exercise. He feels his current life quality is okay. Denies chest pain, acute discomfort, edema. Little cough or phlegm.  05/04/12-  41 yo M former smoker followed for COPD/ emphysema with  recent concern for pulmonary nodules [facility] CXR 04/27/11- COPD with no mention of nodules. Sob same, cough yellow to clear, no wheezing, no fcs He talked about another meeting with the [facility] transplant doctors. At this point he is less interested in lung transplant as an option. Echocardiogram 12/16/2011 showed EF 65-70%, no  pulmonary hypertension, grade 1 diastolic dysfunction. [name] increased nocturia. Oxygen is used with sleep and exertion  11/02/12- 25 yo M former smoker followed for COPD/ emphysema with recent concern for pulmonary nodules FOLLOWS FOR: states breathing is about the same since last visit; would like [name] inhaler sample ( his current insurance will cover); also needs refill for [facility] HFA. Stable through the winter with no acute infection. Remains on oxygen now, especially with exertion. Has used Spiriva but would like to try [place] again. a1AT normal MM. CT 05/19/12- images reviewed again with him IMPRESSION:  1. Left upper lobe nodule is stable from 12/11/2010. Additional  follow-up in one year is recommended to document 2 years of  stability. This recommendation follows the consensus statement:  Guidelines for Management of Small Pulmonary Nodules Detected on CT  Scans: A Statement from the Fleischner Society as published in  Radiology 2005; 237:395-400.  2. Additional scattered pulmonary nodules are stable and therefore  considered benign.  3. Previously seen left upper lobe subpleural airspace  consolidation has resolved in the interval.  Original Report Authenticated By: [name], M.D.  12/15/12- 62 yo M former smoker followed for COPD/ emphysema with recent concern for pulmonary nodules FOLLOWS FOR: pt reports x 2wks had a scratchy throat, nonprod cough, fatigue -- statrted taking mucinex x3days seems to help some [name] at pulmonary rehabilitation sent him here. O2 3L/ [employer]  01/11/13 78 yo M former smoker followed for COPD/ emphysema with recent concern for pulmonary nodules   Wife here ACUTE VISIT: fevers, increased SOB, no energy, slight sore throat (not bad), cough-mostly dry.Pain in left side-unsure if from cough. At beach last week. Feels ill. May have gotten chills. Now fever x5 days, 101-102. Increased his oxygen to 4 L. Short of breath, no energy, mild sore  throat, dry cough, tussive left chest pain. No dysuria or nausea. Saw PCP on Tuesday, suspected viral infection.  06/05/13- 5 yo M former smoker followed for COPD/ emphysema, pulmonary nodules        FOLLOWS FOR:  Breathing is unchanged since last OV.  Discuss CT scan No signif  Change. Slow recovery from pneumonia- xrays reviewed.Little cough, no phlegm. Trying Breo.  Ankles swell some. No chest pain or palpitation. O2 2-3 L/ [employer] CT 05/17/13  Also notes atherosclerosis aorta IMPRESSION:  1. Stable uptake stress set left upper lobe nodule is stable in size  from previous exam. Interval scarring and architectural distortion  within this area is likely the sequelae of inflammation or  infection.  2. The other previously referenced nodules are unchanged from  previous exam and are most likely benign.  3. Emphysema.  Electronically Signed  By: Signa Kell M.D.  On: 05/17/2013 12:04   Review of Systems-see HPI Constitutional:   No-   weight loss, night sweats, +fevers, no-chills, +fatigue, lassitude. HEENT:   No-  headaches, difficulty swallowing, tooth/dental problems, +sore throat,       No-  sneezing, itching, ear ache, nasal congestion, post nasal drip,  CV:  +chest pain, no-orthopnea, PND, +swelling in lower extremities, anasarca, dizziness, palpitations Resp: + shortness of breath with exertion, not at rest.  No-   productive cough,  + non-productive cough,  No-  coughing up of blood.              No-   change in color of mucus.  No- wheezing.   Skin: No-   rash or lesions. GI:  No-   heartburn, indigestion, abdominal pain, nausea,   GU: No-   dysuria,  MS:  No-   joint pain or swelling.  Neuro-  Psych:  No- change in mood or affect. No acute depression or anxiety.  No memory loss.   OBJ- General- Alert, Oriented, Affect-appropriate, Distress- none acute. O2 3L. On RA here at rest.  Skin- rash-none, lesions- none, excoriation- none Lymphadenopathy-  none Head- atraumatic            Eyes- Gross vision intact, PERRLA, conjunctivae clear secretions            Ears- Hearing, canals-normal            Nose- Clear, no-Septal dev, mucus, polyps, erosion, perforation             Throat- Mallampati II , mucosa clear , drainage- none, tonsils- atrophic. + Hoarse Neck- flexible , trachea midline, no stridor , thyroid nl, carotid no bruit Chest - symmetrical excursion , unlabored           Heart/CV- RRR , no murmur , no gallop  , no rub, nl s1 s2                           - JVD- none , edema+1-2, stasis changes- none, varices- none           Lung-  very distant sounds, unlabored, cough- none , dullness-none, rub- none           Chest wall-  Abd-  Br/ Gen/ Rectal- Not done, not indicated Extrem- cyanosis- none, clubbing, none, atrophy- none, strength- nl. Neg Homan's Neuro- grossly intact to observation

## 2013-06-07 ENCOUNTER — Encounter (HOSPITAL_COMMUNITY)
Admission: RE | Admit: 2013-06-07 | Discharge: 2013-06-07 | Disposition: A | Discharge status: Home / Self Care | Payer: Self-pay | Source: Ambulatory Visit | Attending: Internal Medicine | Admitting: Internal Medicine

## 2013-06-12 ENCOUNTER — Telehealth: Payer: Self-pay | Admitting: *Deleted

## 2013-06-12 ENCOUNTER — Encounter (HOSPITAL_COMMUNITY)
Admission: RE | Admit: 2013-06-12 | Discharge: 2013-06-12 | Disposition: A | Discharge status: Home / Self Care | Payer: Self-pay | Source: Ambulatory Visit | Attending: Internal Medicine | Admitting: Internal Medicine

## 2013-06-12 NOTE — Telephone Encounter (Signed)
Received PA for [name] Pt ID: [id number] [employer] # [id number] This was approved from 06/12/13-indifinate with [employer]. Approval # [id number] I called and made [facility] aware. Nothing further needed

## 2013-06-14 ENCOUNTER — Encounter (HOSPITAL_COMMUNITY)
Admission: RE | Admit: 2013-06-14 | Discharge: 2013-06-14 | Disposition: A | Discharge status: Home / Self Care | Payer: Self-pay | Source: Ambulatory Visit | Attending: Internal Medicine | Admitting: Internal Medicine

## 2013-06-19 ENCOUNTER — Encounter (HOSPITAL_COMMUNITY): Payer: Self-pay

## 2013-06-19 DIAGNOSIS — J449 Chronic obstructive pulmonary disease, unspecified: Secondary | ICD-10-CM | POA: Insufficient documentation

## 2013-06-19 DIAGNOSIS — R0602 Shortness of breath: Secondary | ICD-10-CM | POA: Insufficient documentation

## 2013-06-19 DIAGNOSIS — J984 Other disorders of lung: Secondary | ICD-10-CM | POA: Insufficient documentation

## 2013-06-19 DIAGNOSIS — J4489 Other specified chronic obstructive pulmonary disease: Secondary | ICD-10-CM | POA: Insufficient documentation

## 2013-06-19 DIAGNOSIS — Z5189 Encounter for other specified aftercare: Secondary | ICD-10-CM | POA: Insufficient documentation

## 2013-06-21 ENCOUNTER — Encounter (HOSPITAL_COMMUNITY)
Admission: RE | Admit: 2013-06-21 | Discharge: 2013-06-21 | Disposition: A | Discharge status: Home / Self Care | Payer: Self-pay | Source: Ambulatory Visit | Attending: Internal Medicine | Admitting: Internal Medicine

## 2013-06-21 NOTE — Assessment & Plan Note (Signed)
Continues stable on f/u

## 2013-06-21 NOTE — Assessment & Plan Note (Signed)
Price comparison Breo w/ symbicort- discussion Zpak to hold, Pneumonia vaccine conjugate 13.

## 2013-06-26 ENCOUNTER — Encounter (HOSPITAL_COMMUNITY)
Admission: RE | Admit: 2013-06-26 | Discharge: 2013-06-26 | Disposition: A | Discharge status: Home / Self Care | Payer: Self-pay | Source: Ambulatory Visit | Attending: Internal Medicine | Admitting: Internal Medicine

## 2013-06-28 ENCOUNTER — Encounter (HOSPITAL_COMMUNITY)
Admission: RE | Admit: 2013-06-28 | Discharge: 2013-06-28 | Disposition: A | Discharge status: Home / Self Care | Payer: Self-pay | Source: Ambulatory Visit | Attending: Internal Medicine | Admitting: Internal Medicine

## 2013-07-03 ENCOUNTER — Encounter (HOSPITAL_COMMUNITY)
Admission: RE | Admit: 2013-07-03 | Discharge: 2013-07-03 | Disposition: A | Discharge status: Home / Self Care | Payer: Self-pay | Source: Ambulatory Visit | Attending: Internal Medicine | Admitting: Internal Medicine

## 2013-07-05 ENCOUNTER — Encounter (HOSPITAL_COMMUNITY)
Admission: RE | Admit: 2013-07-05 | Discharge: 2013-07-05 | Disposition: A | Discharge status: Home / Self Care | Payer: Self-pay | Source: Ambulatory Visit | Attending: Internal Medicine | Admitting: Internal Medicine

## 2013-07-10 ENCOUNTER — Encounter (HOSPITAL_COMMUNITY): Payer: Self-pay

## 2013-07-12 ENCOUNTER — Encounter (HOSPITAL_COMMUNITY): Payer: Self-pay

## 2013-07-17 ENCOUNTER — Encounter (HOSPITAL_COMMUNITY)
Admission: RE | Admit: 2013-07-17 | Discharge: 2013-07-17 | Disposition: A | Discharge status: Home / Self Care | Payer: Self-pay | Source: Ambulatory Visit | Attending: Internal Medicine | Admitting: Internal Medicine

## 2013-07-17 DIAGNOSIS — R0602 Shortness of breath: Secondary | ICD-10-CM | POA: Insufficient documentation

## 2013-07-17 DIAGNOSIS — J4489 Other specified chronic obstructive pulmonary disease: Secondary | ICD-10-CM | POA: Insufficient documentation

## 2013-07-17 DIAGNOSIS — Z5189 Encounter for other specified aftercare: Secondary | ICD-10-CM | POA: Insufficient documentation

## 2013-07-17 DIAGNOSIS — J984 Other disorders of lung: Secondary | ICD-10-CM | POA: Insufficient documentation

## 2013-07-17 DIAGNOSIS — J449 Chronic obstructive pulmonary disease, unspecified: Secondary | ICD-10-CM | POA: Insufficient documentation

## 2013-07-19 ENCOUNTER — Encounter (HOSPITAL_COMMUNITY)
Admission: RE | Admit: 2013-07-19 | Discharge: 2013-07-19 | Disposition: A | Discharge status: Home / Self Care | Payer: Self-pay | Source: Ambulatory Visit | Attending: Internal Medicine | Admitting: Internal Medicine

## 2013-07-24 ENCOUNTER — Encounter (HOSPITAL_COMMUNITY): Payer: Self-pay

## 2013-07-26 ENCOUNTER — Encounter (HOSPITAL_COMMUNITY)
Admission: RE | Admit: 2013-07-26 | Discharge: 2013-07-26 | Disposition: A | Discharge status: Home / Self Care | Payer: Self-pay | Source: Ambulatory Visit | Attending: Internal Medicine | Admitting: Internal Medicine

## 2013-07-31 ENCOUNTER — Encounter (HOSPITAL_COMMUNITY)
Admission: RE | Admit: 2013-07-31 | Discharge: 2013-07-31 | Disposition: A | Discharge status: Home / Self Care | Payer: Self-pay | Source: Ambulatory Visit | Attending: Internal Medicine | Admitting: Internal Medicine

## 2013-08-02 ENCOUNTER — Encounter (HOSPITAL_COMMUNITY)
Admission: RE | Admit: 2013-08-02 | Discharge: 2013-08-02 | Disposition: A | Discharge status: Home / Self Care | Payer: Self-pay | Source: Ambulatory Visit | Attending: Internal Medicine | Admitting: Internal Medicine

## 2013-08-07 ENCOUNTER — Encounter (HOSPITAL_COMMUNITY)
Admission: RE | Admit: 2013-08-07 | Discharge: 2013-08-07 | Disposition: A | Discharge status: Home / Self Care | Payer: Self-pay | Source: Ambulatory Visit | Attending: Internal Medicine | Admitting: Internal Medicine

## 2013-08-09 ENCOUNTER — Encounter (HOSPITAL_COMMUNITY): Payer: Self-pay

## 2013-08-14 ENCOUNTER — Encounter (HOSPITAL_COMMUNITY)
Admission: RE | Admit: 2013-08-14 | Discharge: 2013-08-14 | Disposition: A | Discharge status: Home / Self Care | Payer: Self-pay | Source: Ambulatory Visit | Attending: Internal Medicine | Admitting: Internal Medicine

## 2013-08-16 ENCOUNTER — Encounter (HOSPITAL_COMMUNITY): Payer: Self-pay

## 2013-08-16 DIAGNOSIS — J449 Chronic obstructive pulmonary disease, unspecified: Secondary | ICD-10-CM | POA: Insufficient documentation

## 2013-08-16 DIAGNOSIS — J984 Other disorders of lung: Secondary | ICD-10-CM | POA: Insufficient documentation

## 2013-08-16 DIAGNOSIS — Z5189 Encounter for other specified aftercare: Secondary | ICD-10-CM | POA: Insufficient documentation

## 2013-08-16 DIAGNOSIS — J4489 Other specified chronic obstructive pulmonary disease: Secondary | ICD-10-CM | POA: Insufficient documentation

## 2013-08-16 DIAGNOSIS — R0602 Shortness of breath: Secondary | ICD-10-CM | POA: Insufficient documentation

## 2013-08-21 ENCOUNTER — Encounter (HOSPITAL_COMMUNITY)
Admission: RE | Admit: 2013-08-21 | Discharge: 2013-08-21 | Disposition: A | Discharge status: Home / Self Care | Payer: Self-pay | Source: Ambulatory Visit | Attending: Internal Medicine | Admitting: Internal Medicine

## 2013-08-23 ENCOUNTER — Encounter (HOSPITAL_COMMUNITY)
Admission: RE | Admit: 2013-08-23 | Discharge: 2013-08-23 | Disposition: A | Discharge status: Home / Self Care | Payer: Self-pay | Source: Ambulatory Visit | Attending: Internal Medicine | Admitting: Internal Medicine

## 2013-08-28 ENCOUNTER — Encounter (HOSPITAL_COMMUNITY): Payer: Self-pay

## 2013-08-30 ENCOUNTER — Encounter (HOSPITAL_COMMUNITY)
Admission: RE | Admit: 2013-08-30 | Discharge: 2013-08-30 | Disposition: A | Discharge status: Home / Self Care | Payer: Self-pay | Source: Ambulatory Visit | Attending: Internal Medicine | Admitting: Internal Medicine

## 2013-09-04 ENCOUNTER — Encounter (HOSPITAL_COMMUNITY)
Admission: RE | Admit: 2013-09-04 | Discharge: 2013-09-04 | Disposition: A | Discharge status: Home / Self Care | Payer: Self-pay | Source: Ambulatory Visit | Attending: Internal Medicine | Admitting: Internal Medicine

## 2013-09-06 ENCOUNTER — Encounter (HOSPITAL_COMMUNITY)
Admission: RE | Admit: 2013-09-06 | Discharge: 2013-09-06 | Disposition: A | Discharge status: Home / Self Care | Payer: Self-pay | Source: Ambulatory Visit | Attending: Internal Medicine | Admitting: Internal Medicine

## 2013-09-11 ENCOUNTER — Encounter (HOSPITAL_COMMUNITY)
Admission: RE | Admit: 2013-09-11 | Discharge: 2013-09-11 | Disposition: A | Discharge status: Home / Self Care | Payer: Self-pay | Source: Ambulatory Visit | Attending: Internal Medicine | Admitting: Internal Medicine

## 2013-09-13 ENCOUNTER — Encounter (HOSPITAL_COMMUNITY): Payer: Self-pay

## 2013-09-18 ENCOUNTER — Encounter (HOSPITAL_COMMUNITY): Payer: Medicare Other | Attending: Internal Medicine

## 2013-09-18 DIAGNOSIS — J4489 Other specified chronic obstructive pulmonary disease: Secondary | ICD-10-CM | POA: Insufficient documentation

## 2013-09-18 DIAGNOSIS — R0602 Shortness of breath: Secondary | ICD-10-CM | POA: Insufficient documentation

## 2013-09-18 DIAGNOSIS — Z5189 Encounter for other specified aftercare: Secondary | ICD-10-CM | POA: Insufficient documentation

## 2013-09-18 DIAGNOSIS — J449 Chronic obstructive pulmonary disease, unspecified: Secondary | ICD-10-CM | POA: Insufficient documentation

## 2013-09-18 DIAGNOSIS — J984 Other disorders of lung: Secondary | ICD-10-CM | POA: Insufficient documentation

## 2013-09-20 ENCOUNTER — Encounter (HOSPITAL_COMMUNITY): Payer: Self-pay

## 2013-09-25 ENCOUNTER — Encounter (HOSPITAL_COMMUNITY): Payer: Self-pay

## 2013-09-27 ENCOUNTER — Encounter (HOSPITAL_COMMUNITY): Payer: Self-pay

## 2013-10-02 ENCOUNTER — Encounter (HOSPITAL_COMMUNITY): Payer: Self-pay

## 2013-10-04 ENCOUNTER — Encounter (HOSPITAL_COMMUNITY): Payer: Self-pay

## 2013-10-08 DIAGNOSIS — I10 Essential (primary) hypertension: Secondary | ICD-10-CM | POA: Diagnosis not present

## 2013-10-08 DIAGNOSIS — I471 Supraventricular tachycardia: Secondary | ICD-10-CM | POA: Diagnosis not present

## 2013-10-09 ENCOUNTER — Encounter (HOSPITAL_COMMUNITY): Payer: Self-pay

## 2013-10-11 ENCOUNTER — Encounter (HOSPITAL_COMMUNITY): Payer: Self-pay

## 2013-10-15 DIAGNOSIS — Z79899 Other long term (current) drug therapy: Secondary | ICD-10-CM | POA: Diagnosis not present

## 2013-10-15 DIAGNOSIS — J449 Chronic obstructive pulmonary disease, unspecified: Secondary | ICD-10-CM | POA: Diagnosis not present

## 2013-10-16 ENCOUNTER — Encounter (HOSPITAL_COMMUNITY)
Admission: RE | Admit: 2013-10-16 | Discharge: 2013-10-16 | Disposition: A | Discharge status: Home / Self Care | Payer: Self-pay | Source: Ambulatory Visit | Attending: Internal Medicine | Admitting: Internal Medicine

## 2013-10-16 DIAGNOSIS — Z5189 Encounter for other specified aftercare: Secondary | ICD-10-CM | POA: Insufficient documentation

## 2013-10-16 DIAGNOSIS — J984 Other disorders of lung: Secondary | ICD-10-CM | POA: Insufficient documentation

## 2013-10-16 DIAGNOSIS — J4489 Other specified chronic obstructive pulmonary disease: Secondary | ICD-10-CM | POA: Insufficient documentation

## 2013-10-16 DIAGNOSIS — R0602 Shortness of breath: Secondary | ICD-10-CM | POA: Insufficient documentation

## 2013-10-16 DIAGNOSIS — J449 Chronic obstructive pulmonary disease, unspecified: Secondary | ICD-10-CM | POA: Insufficient documentation

## 2013-10-18 ENCOUNTER — Encounter (HOSPITAL_COMMUNITY)
Admission: RE | Admit: 2013-10-18 | Discharge: 2013-10-18 | Disposition: A | Discharge status: Home / Self Care | Payer: Self-pay | Source: Ambulatory Visit | Attending: Internal Medicine | Admitting: Internal Medicine

## 2013-10-23 ENCOUNTER — Encounter (HOSPITAL_COMMUNITY)
Admission: RE | Admit: 2013-10-23 | Discharge: 2013-10-23 | Disposition: A | Discharge status: Home / Self Care | Payer: Self-pay | Source: Ambulatory Visit | Attending: Internal Medicine | Admitting: Internal Medicine

## 2013-10-24 DIAGNOSIS — J449 Chronic obstructive pulmonary disease, unspecified: Secondary | ICD-10-CM | POA: Diagnosis not present

## 2013-10-24 DIAGNOSIS — I10 Essential (primary) hypertension: Secondary | ICD-10-CM | POA: Diagnosis not present

## 2013-10-25 ENCOUNTER — Encounter (HOSPITAL_COMMUNITY)
Admission: RE | Admit: 2013-10-25 | Discharge: 2013-10-25 | Disposition: A | Discharge status: Home / Self Care | Payer: Self-pay | Source: Ambulatory Visit | Attending: Internal Medicine | Admitting: Internal Medicine

## 2013-10-30 ENCOUNTER — Encounter (HOSPITAL_COMMUNITY)
Admission: RE | Admit: 2013-10-30 | Discharge: 2013-10-30 | Disposition: A | Discharge status: Home / Self Care | Payer: Self-pay | Source: Ambulatory Visit | Attending: Internal Medicine | Admitting: Internal Medicine

## 2013-11-01 ENCOUNTER — Encounter (HOSPITAL_COMMUNITY)
Admission: RE | Admit: 2013-11-01 | Discharge: 2013-11-01 | Disposition: A | Discharge status: Home / Self Care | Payer: Self-pay | Source: Ambulatory Visit | Attending: Internal Medicine | Admitting: Internal Medicine

## 2013-11-06 ENCOUNTER — Encounter (HOSPITAL_COMMUNITY)
Admission: RE | Admit: 2013-11-06 | Discharge: 2013-11-06 | Disposition: A | Discharge status: Home / Self Care | Payer: Self-pay | Source: Ambulatory Visit | Attending: Internal Medicine | Admitting: Internal Medicine

## 2013-11-08 ENCOUNTER — Encounter (HOSPITAL_COMMUNITY)
Admission: RE | Admit: 2013-11-08 | Discharge: 2013-11-08 | Disposition: A | Discharge status: Home / Self Care | Payer: Self-pay | Source: Ambulatory Visit | Attending: Internal Medicine | Admitting: Internal Medicine

## 2013-11-12 ENCOUNTER — Ambulatory Visit (INDEPENDENT_AMBULATORY_CARE_PROVIDER_SITE_OTHER): Payer: Medicare Other | Admitting: Internal Medicine

## 2013-11-12 ENCOUNTER — Ambulatory Visit (INDEPENDENT_AMBULATORY_CARE_PROVIDER_SITE_OTHER)
Admission: RE | Admit: 2013-11-12 | Discharge: 2013-11-12 | Disposition: A | Discharge status: Home / Self Care | Payer: Medicare Other | Source: Ambulatory Visit | Attending: Internal Medicine | Admitting: Internal Medicine

## 2013-11-12 ENCOUNTER — Telehealth: Payer: Self-pay

## 2013-11-12 ENCOUNTER — Other Ambulatory Visit (INDEPENDENT_AMBULATORY_CARE_PROVIDER_SITE_OTHER): Payer: Medicare Other

## 2013-11-12 ENCOUNTER — Encounter: Payer: Self-pay | Admitting: Internal Medicine

## 2013-11-12 VITALS — BP 150/84 | HR 108 | Temp 99.9°F | Ht 69.0 in | Wt 178.2 lb

## 2013-11-12 DIAGNOSIS — J441 Chronic obstructive pulmonary disease with (acute) exacerbation: Secondary | ICD-10-CM

## 2013-11-12 DIAGNOSIS — R059 Cough, unspecified: Secondary | ICD-10-CM | POA: Diagnosis not present

## 2013-11-12 DIAGNOSIS — J45901 Unspecified asthma with (acute) exacerbation: Secondary | ICD-10-CM

## 2013-11-12 DIAGNOSIS — R05 Cough: Secondary | ICD-10-CM | POA: Diagnosis not present

## 2013-11-12 DIAGNOSIS — J984 Other disorders of lung: Secondary | ICD-10-CM

## 2013-11-12 MED ORDER — PROMETHAZINE-CODEINE 6.25-10 MG/5ML PO SYRP: 5.0000 mL | ORAL_SOLUTION | Freq: Four times a day (QID) | ORAL | Status: DC | PRN

## 2013-11-12 NOTE — Telephone Encounter (Signed)
Message copied by Len Blalock on Mon Nov 12, 2013  5:51 PM ------      Message from: [place], Tarri Fuller D      Created: Mon Nov 12, 2013  5:47 PM       CXR- shows no pneumonia or change. This is consistent with bronchitis. Ok to continue treatment as planned. ------

## 2013-11-12 NOTE — Patient Instructions (Addendum)
Order- lab CBC w/ diff            CXR-- dx acute exacerbation of COPD  Finish Z pak  If you are not seeing improvement by Wednesday morning, please call  We will call in script for cough syrup to use if needed

## 2013-11-12 NOTE — Telephone Encounter (Signed)
Pt aware of cxr results and recs.  Nothing further needed.

## 2013-11-12 NOTE — Progress Notes (Signed)
Patient ID: [name], male    DOB: 06-13-1935, 79 y.o.   MRN: [id number] PCP Dr Hulan Fess HPI 01/25/11- 24 yo M former smoker followed for COPD/ emphysema with recent concern for pulmonary nodules. Last here May 20, 2010- note reviewed. CT done during the winter showed nodules.  PET positive nodule Left lung faded before needle bx. He had gone to [facility] for second opinion w/ Dr [name] where severity of his advanced lung disease was emphasized.  lung volume reduction and transplant were mentioned although I don't know how seriously, given his age.[name] He comes today to update and to have conference including his wife:  Conference on overall status: He had concern about amount of radiation from imaging and I reminded him that is why we chose to do CXR 05/20/10 which showed stable COPD. PFT 07/01/05 - FEV1 1.13/ 0.39; FEV1/FVC 0.25; DLCO 0.52; insignif response to bronchodilator. PFT at [facility] 11/26/10- FEV1 0.82/ 0.25; FEV1/FVC 0.22 Quality of life and prognosis discussed. He has noted some decline over past year. He could walk through [employer] without O2 then and no longer can. Emphysema pattern with little cough, wheeze, phlegm. No chest pain, palpitation or ankle edema. After long discussion we agreed to an inquiry to [facility] Transplant coordinator for consideration. I told him his age made acceptance unlikely. We discussed limited role of meds,He has had Pneumovax, annual flu vax, Pulmonary Rehab, and test normal for a1AT in the past. Has O2 2l/m for sleep and exertion, home nebulizer. He asks another script for prednisone to hold and we discussed side effects.   05/20/11 79 yo M former smoker followed for COPD/ emphysema with recent concern for pulmonary nodules. .. wife here He and his wife come today for an extended review of his early experience with [facility] lung transplant team evaluation. PFT- [facility] 04/27/11- FEV1 0.78/23%, FEV1/FVC 0.27, TLC 98%, RV 163%, DLCO 37%. ABG-room air-04/27/2011  pH 7.42, PCO2 40, PO2 63, bicarbonate 26, O2 saturation 91.9% 6 minute walk test 04/27/2011: 314.6 m (60% to distance predicted) with significant dyspnea while wearing oxygen at 3 L. He came away with significant reservations about the prospects for transplant. He asked for my estimate of his life expectancy without transplant. I suggested that barring an intervening acute illness, an additional 5 years is possible. He understands this is a very loose estimate. I deferred questions about hepatitis B vaccination and testosterone replacement to his primary physician. He wanted to know what else we might do to test and treat him if he chose not to seek transplant. We will watch his lung nodules with chest x-ray and/or CT, but our ability to intervene would be limited. If he does not have transplant, I don't know that cardiac stress test or repeat pulmonary function tests would add much useful  information unless symptoms change.  10/20/11- 26 yo M former smoker followed for COPD/ emphysema with recent concern for pulmonary nodules [facility] CXR 04/27/11- COPD with no mention of nodules. Followup at [facility] pulmonary has been deferred until September. They want stress test. He is leaning away from transplant at this time of blood and he wanted my thoughts. Discussed his left upper lobe nodule, atherosclerosis on CT scan in April of 2012. He continues pulmonary rehabilitation. Oxygen saturation on 3 L is 96% during exercise. He feels his current life quality is okay. Denies chest pain, acute discomfort, edema. Little cough or phlegm.  05/04/12-  41 yo M former smoker followed for COPD/ emphysema with  recent concern for pulmonary nodules [facility] CXR 04/27/11- COPD with no mention of nodules. Sob same, cough yellow to clear, no wheezing, no fcs He talked about another meeting with the [facility] transplant doctors. At this point he is less interested in lung transplant as an option. Echocardiogram 12/16/2011 showed EF 65-70%, no  pulmonary hypertension, grade 1 diastolic dysfunction. [name] increased nocturia. Oxygen is used with sleep and exertion  11/02/12- 5 yo M former smoker followed for COPD/ emphysema with recent concern for pulmonary nodules FOLLOWS FOR: states breathing is about the same since last visit; would like [name] inhaler sample ( his current insurance will cover); also needs refill for [facility] HFA. Stable through the winter with no acute infection. Remains on oxygen now, especially with exertion. Has used Spiriva but would like to try [place] again. a1AT normal MM. CT 05/19/12- images reviewed again with him IMPRESSION:  1. Left upper lobe nodule is stable from 12/11/2010. Additional  follow-up in one year is recommended to document 2 years of  stability. This recommendation follows the consensus statement:  Guidelines for Management of Small Pulmonary Nodules Detected on CT  Scans: A Statement from the [employer] as published in  Radiology 2005; 237:395-400.  2. Additional scattered pulmonary nodules are stable and therefore  considered benign.  3. Previously seen left upper lobe subpleural airspace  consolidation has resolved in the interval.  Original Report Authenticated By: [name], M.D.  12/15/12- 72 yo M former smoker followed for COPD/ emphysema with recent concern for pulmonary nodules FOLLOWS FOR: pt reports x 2wks had a scratchy throat, nonprod cough, fatigue -- statrted taking mucinex x3days seems to help some [name] at pulmonary rehabilitation sent him here. O2 3L/ [employer]  01/11/13 79 yo M former smoker followed for COPD/ emphysema with recent concern for pulmonary nodules   Wife here ACUTE VISIT: fevers, increased SOB, no energy, slight sore throat (not bad), cough-mostly dry.Pain in left side-unsure if from cough. At beach last week. Feels ill. May have gotten chills. Now fever x5 days, 101-102. Increased his oxygen to 4 L. Short of breath, no energy, mild sore  throat, dry cough, tussive left chest pain. No dysuria or nausea. Saw PCP on Tuesday, suspected viral infection.  06/05/13- 26 yo M former smoker followed for COPD/ emphysema, pulmonary nodules        FOLLOWS FOR:  Breathing is unchanged since last OV.  Discuss CT scan No signif  Change. Slow recovery from pneumonia- xrays reviewed.Little cough, no phlegm. Trying Breo.  Ankles swell some. No chest pain or palpitation. O2 2-3 L/ [employer] CT 05/17/13  Also notes atherosclerosis aorta IMPRESSION:  1. Stable uptake stress set left upper lobe nodule is stable in size  from previous exam. Interval scarring and architectural distortion  within this area is likely the sequelae of inflammation or  infection.  2. The other previously referenced nodules are unchanged from  previous exam and are most likely benign.  3. Emphysema.  Electronically Signed  By: Kerby Moors M.D.  On: 05/17/2013 12:04  11/12/13- 26 yo M former smoker followed for COPD/ emphysema, pulmonary nodules  ACUTE VISIT:  Fever and congestion with cough and brown mucus.  Also, has wheezing  wife here More short of breath over the weekend with stuffy nose. Temperature 100.1 last night. Started Z-Pak yesterday. Much cough productive brown. More short of breath, no energy, malaise.   Review of Systems-see HPI Constitutional:   No-   weight loss, night sweats, +fevers, no-chills, +fatigue,  lassitude. HEENT:   No-  headaches, difficulty swallowing, tooth/dental problems, +sore throat,       No-  sneezing, itching, ear ache, nasal congestion, post nasal drip,  CV:  +chest pain, no-orthopnea, PND, +swelling in lower extremities, anasarca, dizziness, palpitations Resp: + shortness of breath with exertion, not at rest.              + productive cough,  + non-productive cough,  No-  coughing up of blood.             + change in color of mucus.  No- wheezing.   Skin: No-   rash or lesions. GI:  No-   heartburn, indigestion, abdominal  pain, nausea,   GU: No-   dysuria,  MS:  No-   joint pain or swelling.  Neuro-  Psych:  No- change in mood or affect. No acute depression or anxiety.  No memory loss.   OBJ- General- Alert, Oriented, Affect-appropriate, Distress- none acute. O2 4L.  Skin- rash-none, lesions- none, excoriation- none Lymphadenopathy- none Head- atraumatic            Eyes- Gross vision intact, PERRLA, conjunctivae clear secretions            Ears- Hearing, canals-normal            Nose- Clear, no-Septal dev, mucus, polyps, erosion, perforation             Throat- Mallampati II , mucosa clear , drainage- none, tonsils- atrophic. + Hoarse Neck- flexible , trachea midline, no stridor , thyroid nl, carotid no bruit Chest - symmetrical excursion , unlabored           Heart/CV- RRR , no murmur , no gallop  , no rub, nl s1 s2                           - JVD- none , edema+1, stasis changes- none, varices- none           Lung-  very distant sounds, unlabored, cough+ deep , dullness-none, rub- none           Chest wall-  Abd-  Br/ Gen/ Rectal- Not done, not indicated Extrem- cyanosis- none, clubbing, none, atrophy- none, strength- nl.  Neuro- grossly intact to observation

## 2013-11-13 ENCOUNTER — Telehealth: Payer: Self-pay | Admitting: Internal Medicine

## 2013-11-13 ENCOUNTER — Encounter (HOSPITAL_COMMUNITY): Payer: Self-pay

## 2013-11-13 LAB — CBC WITH DIFFERENTIAL/PLATELET
Basophils Absolute: 0.1 10*3/uL (ref 0.0–0.1)
Basophils Relative: 0.5 % (ref 0.0–3.0)
Eosinophils Absolute: 0.1 10*3/uL (ref 0.0–0.7)
Eosinophils Relative: 0.5 % (ref 0.0–5.0)
HCT: 40.9 % (ref 39.0–52.0)
Hemoglobin: 13.6 g/dL (ref 13.0–17.0)
Lymphocytes Relative: 6.8 % — ABNORMAL LOW (ref 12.0–46.0)
Lymphs Abs: 0.7 10*3/uL (ref 0.7–4.0)
MCHC: 33.2 g/dL (ref 30.0–36.0)
MCV: 86.7 fl (ref 78.0–100.0)
Monocytes Absolute: 0.7 10*3/uL (ref 0.1–1.0)
Monocytes Relative: 6.7 % (ref 3.0–12.0)
Neutro Abs: 9 10*3/uL — ABNORMAL HIGH (ref 1.4–7.7)
Neutrophils Relative %: 85.5 % — ABNORMAL HIGH (ref 43.0–77.0)
Platelets: 220 10*3/uL (ref 150.0–400.0)
RBC: 4.72 Mil/uL (ref 4.22–5.81)
RDW: 14.1 % (ref 11.5–14.6)
WBC: 10.6 10*3/uL — ABNORMAL HIGH (ref 4.5–10.5)

## 2013-11-13 MED ORDER — PREDNISONE 10 MG PO TABS: ORAL_TABLET | ORAL | Status: DC

## 2013-11-13 NOTE — Telephone Encounter (Signed)
Katie to send prednisone taper 10 mg, # 20, 4 X 2 DAYS, 3 X 2 DAYS, 2 X 2 DAYS, 1 X 2 DAYS

## 2013-11-13 NOTE — Telephone Encounter (Signed)
Pt is aware of recs and rx called in. Nothing further needed

## 2013-11-13 NOTE — Telephone Encounter (Signed)
Spoke with pt. He reports his breathing is slighty worse today. He is wanting prednisone called in. Please advise Dr. Annamaria Boots thanks  Allergies  Allergen Reactions  . Tolectin [Tolmetin Sodium] Other (See Comments)    BP low and passed out  . Montelukast Sodium     Current Outpatient Prescriptions on File Prior to Visit  Medication Sig Dispense Refill  . albuterol (PROAIR HFA) 108 (90 BASE) MCG/ACT inhaler Inhale 2 puffs into the lungs 4 (four) times daily as needed for wheezing or shortness of breath.  1 Inhaler  5  . albuterol (PROVENTIL) (2.5 MG/3ML) 0.083% nebulizer solution Take 2.5 mg by nebulization every 6 (six) hours as needed.        [name] amLODipine (NORVASC) 5 MG tablet Take 5 mg by mouth daily.        [name] aspirin 81 MG tablet Take 81 mg by mouth daily.        [name] azithromycin (ZITHROMAX) 250 MG tablet 2 today then one daily  6 tablet  0  . budesonide-formoterol (SYMBICORT) 160-4.5 MCG/ACT inhaler Inhale 2 puffs into the lungs 2 (two) times daily.  1 Inhaler  4  . digoxin (LANOXIN) 0.25 MG tablet Take 250 mcg by mouth daily.        [name] losartan (COZAAR) 50 MG tablet Take 1 tablet by mouth Daily.      . Multiple Vitamin (MULTIVITAMIN) capsule Take 1 capsule by mouth daily.        . pravastatin (PRAVACHOL) 40 MG tablet Take 1 tablet by mouth daily.      . promethazine-codeine (PHENERGAN WITH CODEINE) 6.25-10 MG/5ML syrup Take 5 mLs by mouth every 6 (six) hours as needed for cough.  200 mL  0  . tiotropium (SPIRIVA) 18 MCG inhalation capsule Place 18 mcg into inhaler and inhale daily.       No current facility-administered medications on file prior to visit.

## 2013-11-13 NOTE — Telephone Encounter (Signed)
Patient returning call.

## 2013-11-14 ENCOUNTER — Telehealth: Payer: Self-pay | Admitting: Internal Medicine

## 2013-11-14 MED ORDER — AMOXICILLIN-POT CLAVULANATE 875-125 MG PO TABS: 1.0000 | ORAL_TABLET | Freq: Two times a day (BID) | ORAL | Status: DC

## 2013-11-14 NOTE — Telephone Encounter (Signed)
Spoke with pt.  Pt still not feeling much better.  Has one dose of Zpak last night.  Started Prednisone last night.  Also taking Phenergan with codeine.  Still c/o prod cough (blood tinged this am) - sinus congestion - Doesn't think he has fever Allergies  Allergen Reactions  . Tolectin [Tolmetin Sodium] Other (See Comments)    BP low and passed out  . Montelukast Sodium

## 2013-11-14 NOTE — Telephone Encounter (Signed)
Per CY send augmentin 875 into pharm, this done and pt aware. Nothing further needed

## 2013-11-15 ENCOUNTER — Encounter (HOSPITAL_COMMUNITY): Payer: Self-pay

## 2013-11-15 DIAGNOSIS — R0602 Shortness of breath: Secondary | ICD-10-CM | POA: Insufficient documentation

## 2013-11-15 DIAGNOSIS — J449 Chronic obstructive pulmonary disease, unspecified: Secondary | ICD-10-CM | POA: Insufficient documentation

## 2013-11-15 DIAGNOSIS — J4489 Other specified chronic obstructive pulmonary disease: Secondary | ICD-10-CM | POA: Insufficient documentation

## 2013-11-15 DIAGNOSIS — Z5189 Encounter for other specified aftercare: Secondary | ICD-10-CM | POA: Insufficient documentation

## 2013-11-15 DIAGNOSIS — J984 Other disorders of lung: Secondary | ICD-10-CM | POA: Insufficient documentation

## 2013-11-20 ENCOUNTER — Encounter (HOSPITAL_COMMUNITY): Payer: Self-pay

## 2013-11-22 ENCOUNTER — Encounter (HOSPITAL_COMMUNITY): Payer: Self-pay

## 2013-11-22 DIAGNOSIS — J329 Chronic sinusitis, unspecified: Secondary | ICD-10-CM | POA: Diagnosis not present

## 2013-11-27 ENCOUNTER — Encounter (HOSPITAL_COMMUNITY)
Admission: RE | Admit: 2013-11-27 | Discharge: 2013-11-27 | Disposition: A | Discharge status: Home / Self Care | Payer: Self-pay | Source: Ambulatory Visit | Attending: Internal Medicine | Admitting: Internal Medicine

## 2013-11-28 ENCOUNTER — Ambulatory Visit: Payer: Medicare Other | Admitting: Cardiovascular Disease

## 2013-11-29 ENCOUNTER — Encounter (HOSPITAL_COMMUNITY)
Admission: RE | Admit: 2013-11-29 | Discharge: 2013-11-29 | Disposition: A | Discharge status: Home / Self Care | Payer: Self-pay | Source: Ambulatory Visit | Attending: Internal Medicine | Admitting: Internal Medicine

## 2013-12-04 ENCOUNTER — Encounter (HOSPITAL_COMMUNITY)
Admission: RE | Admit: 2013-12-04 | Discharge: 2013-12-04 | Disposition: A | Discharge status: Home / Self Care | Payer: Self-pay | Source: Ambulatory Visit | Attending: Internal Medicine | Admitting: Internal Medicine

## 2013-12-06 ENCOUNTER — Encounter (HOSPITAL_COMMUNITY): Payer: Self-pay

## 2013-12-06 ENCOUNTER — Ambulatory Visit: Payer: Medicare Other | Admitting: Internal Medicine

## 2013-12-08 NOTE — Assessment & Plan Note (Signed)
Plan-chest x-ray 

## 2013-12-08 NOTE — Assessment & Plan Note (Signed)
Bronchitis versus pneumonia Plan-finish Z-Pak, extra fluids and rest, chest x-ray

## 2013-12-11 ENCOUNTER — Encounter (HOSPITAL_COMMUNITY)
Admission: RE | Admit: 2013-12-11 | Discharge: 2013-12-11 | Disposition: A | Discharge status: Home / Self Care | Payer: Self-pay | Source: Ambulatory Visit | Attending: Internal Medicine | Admitting: Internal Medicine

## 2013-12-13 ENCOUNTER — Ambulatory Visit (INDEPENDENT_AMBULATORY_CARE_PROVIDER_SITE_OTHER): Payer: Medicare Other | Admitting: Internal Medicine

## 2013-12-13 ENCOUNTER — Encounter (HOSPITAL_COMMUNITY)
Admission: RE | Admit: 2013-12-13 | Discharge: 2013-12-13 | Disposition: A | Discharge status: Home / Self Care | Payer: Self-pay | Source: Ambulatory Visit | Attending: Internal Medicine | Admitting: Internal Medicine

## 2013-12-13 ENCOUNTER — Encounter: Payer: Self-pay | Admitting: Internal Medicine

## 2013-12-13 VITALS — BP 140/70 | HR 100 | Ht 69.0 in | Wt 178.4 lb

## 2013-12-13 DIAGNOSIS — J438 Other emphysema: Secondary | ICD-10-CM

## 2013-12-13 DIAGNOSIS — J984 Other disorders of lung: Secondary | ICD-10-CM

## 2013-12-13 DIAGNOSIS — I2510 Atherosclerotic heart disease of native coronary artery without angina pectoris: Secondary | ICD-10-CM | POA: Diagnosis not present

## 2013-12-13 DIAGNOSIS — J439 Emphysema, unspecified: Secondary | ICD-10-CM

## 2013-12-13 DIAGNOSIS — N400 Enlarged prostate without lower urinary tract symptoms: Secondary | ICD-10-CM | POA: Diagnosis not present

## 2013-12-13 MED ORDER — BUDESONIDE 180 MCG/ACT IN AEPB: 2.0000 | INHALATION_SPRAY | Freq: Two times a day (BID) | RESPIRATORY_TRACT | Status: DC

## 2013-12-13 MED ORDER — UMECLIDINIUM-VILANTEROL 62.5-25 MCG/INH IN AEPB: 1.0000 | INHALATION_SPRAY | Freq: Every day | RESPIRATORY_TRACT | Status: DC

## 2013-12-13 NOTE — Patient Instructions (Signed)
Sample Pulmicort inhaler   2 puffs then rinse mouth, twice daily  Sample Anoro Ellipta inhaler    1 puff, one time daily  Try these samples for now instead of of Symbicort and Spiriva

## 2013-12-13 NOTE — Progress Notes (Signed)
Patient ID: [name], male    DOB: 06-13-1935, 79 y.o.   MRN: [id number] PCP Dr Hulan Fess HPI 01/25/11- 24 yo M former smoker followed for COPD/ emphysema with recent concern for pulmonary nodules. Last here May 20, 2010- note reviewed. CT done during the winter showed nodules.  PET positive nodule Left lung faded before needle bx. He had gone to [facility] for second opinion w/ Dr [name] where severity of his advanced lung disease was emphasized.  lung volume reduction and transplant were mentioned although I don't know how seriously, given his age.[name] He comes today to update and to have conference including his wife:  Conference on overall status: He had concern about amount of radiation from imaging and I reminded him that is why we chose to do CXR 05/20/10 which showed stable COPD. PFT 07/01/05 - FEV1 1.13/ 0.39; FEV1/FVC 0.25; DLCO 0.52; insignif response to bronchodilator. PFT at [facility] 11/26/10- FEV1 0.82/ 0.25; FEV1/FVC 0.22 Quality of life and prognosis discussed. He has noted some decline over past year. He could walk through [employer] without O2 then and no longer can. Emphysema pattern with little cough, wheeze, phlegm. No chest pain, palpitation or ankle edema. After long discussion we agreed to an inquiry to [facility] Transplant coordinator for consideration. I told him his age made acceptance unlikely. We discussed limited role of meds,He has had Pneumovax, annual flu vax, Pulmonary Rehab, and test normal for a1AT in the past. Has O2 2l/m for sleep and exertion, home nebulizer. He asks another script for prednisone to hold and we discussed side effects.   05/20/11 79 yo M former smoker followed for COPD/ emphysema with recent concern for pulmonary nodules. .. wife here He and his wife come today for an extended review of his early experience with [facility] lung transplant team evaluation. PFT- [facility] 04/27/11- FEV1 0.78/23%, FEV1/FVC 0.27, TLC 98%, RV 163%, DLCO 37%. ABG-room air-04/27/2011  pH 7.42, PCO2 40, PO2 63, bicarbonate 26, O2 saturation 91.9% 6 minute walk test 04/27/2011: 314.6 m (60% to distance predicted) with significant dyspnea while wearing oxygen at 3 L. He came away with significant reservations about the prospects for transplant. He asked for my estimate of his life expectancy without transplant. I suggested that barring an intervening acute illness, an additional 5 years is possible. He understands this is a very loose estimate. I deferred questions about hepatitis B vaccination and testosterone replacement to his primary physician. He wanted to know what else we might do to test and treat him if he chose not to seek transplant. We will watch his lung nodules with chest x-ray and/or CT, but our ability to intervene would be limited. If he does not have transplant, I don't know that cardiac stress test or repeat pulmonary function tests would add much useful  information unless symptoms change.  10/20/11- 26 yo M former smoker followed for COPD/ emphysema with recent concern for pulmonary nodules [facility] CXR 04/27/11- COPD with no mention of nodules. Followup at [facility] pulmonary has been deferred until September. They want stress test. He is leaning away from transplant at this time of blood and he wanted my thoughts. Discussed his left upper lobe nodule, atherosclerosis on CT scan in April of 2012. He continues pulmonary rehabilitation. Oxygen saturation on 3 L is 96% during exercise. He feels his current life quality is okay. Denies chest pain, acute discomfort, edema. Little cough or phlegm.  05/04/12-  41 yo M former smoker followed for COPD/ emphysema with  recent concern for pulmonary nodules [facility] CXR 04/27/11- COPD with no mention of nodules. Sob same, cough yellow to clear, no wheezing, no fcs He talked about another meeting with the [place] transplant doctors. At this point he is less interested in lung transplant as an option. Echocardiogram 12/16/2011 showed EF 65-70%, no  pulmonary hypertension, grade 1 diastolic dysfunction. [name] increased nocturia. Oxygen is used with sleep and exertion  11/02/12- 35 yo M former smoker followed for COPD/ emphysema with recent concern for pulmonary nodules FOLLOWS FOR: states breathing is about the same since last visit; would like [name] inhaler sample ( his current insurance will cover); also needs refill for [facility] HFA. Stable through the winter with no acute infection. Remains on oxygen now, especially with exertion. Has used Spiriva but would like to try [place] again. a1AT normal MM. CT 05/19/12- images reviewed again with him IMPRESSION:  1. Left upper lobe nodule is stable from 12/11/2010. Additional  follow-up in one year is recommended to document 2 years of  stability. This recommendation follows the consensus statement:  Guidelines for Management of Small Pulmonary Nodules Detected on CT  Scans: A Statement from the [employer] as published in  Radiology 2005; 237:395-400.  2. Additional scattered pulmonary nodules are stable and therefore  considered benign.  3. Previously seen left upper lobe subpleural airspace  consolidation has resolved in the interval.  Original Report Authenticated By: [name], M.D.  12/15/12- 28 yo M former smoker followed for COPD/ emphysema with recent concern for pulmonary nodules FOLLOWS FOR: pt reports x 2wks had a scratchy throat, nonprod cough, fatigue -- statrted taking mucinex x3days seems to help some [name] at pulmonary rehabilitation sent him here. O2 3L/ [employer]  01/11/13 79 yo M former smoker followed for COPD/ emphysema with recent concern for pulmonary nodules   Wife here ACUTE VISIT: fevers, increased SOB, no energy, slight sore throat (not bad), cough-mostly dry.Pain in left side-unsure if from cough. At beach last week. Feels ill. May have gotten chills. Now fever x5 days, 101-102. Increased his oxygen to 4 L. Short of breath, no energy, mild sore  throat, dry cough, tussive left chest pain. No dysuria or nausea. Saw PCP on Tuesday, suspected viral infection.  06/05/13- 17 yo M former smoker followed for COPD/ emphysema, pulmonary nodules        FOLLOWS FOR:  Breathing is unchanged since last OV.  Discuss CT scan No signif  Change. Slow recovery from pneumonia- xrays reviewed.Little cough, no phlegm. Trying Breo.  Ankles swell some. No chest pain or palpitation. O2 2-3 L/ [employer] CT 05/17/13  Also notes atherosclerosis aorta IMPRESSION:  1. Stable uptake stress set left upper lobe nodule is stable in size  from previous exam. Interval scarring and architectural distortion  within this area is likely the sequelae of inflammation or  infection.  2. The other previously referenced nodules are unchanged from  previous exam and are most likely benign.  3. Emphysema.  Electronically Signed  By: Kerby Moors M.D.  On: 05/17/2013 12:04  11/12/13- 89 yo M former smoker followed for COPD/ emphysema, pulmonary nodules  ACUTE VISIT:  Fever and congestion with cough and brown mucus.  Also, has wheezing  wife here More short of breath over the weekend with stuffy nose. Temperature 100.1 last night. Started Z-Pak yesterday. Much cough productive brown. More short of breath, no energy, malaise.   12/13/13- 32 yo M former smoker followed for COPD/ emphysema, pulmonary nodules  FOLLOWS FOR:  Breathing  improved since last OV.  Still having sob with exertion Comfortable off of oxygen while sitting quietly but needs oxygen at 3 L/ [employer] for any exertion and for sleep. CBC w/ diff 11/12/13- WBC 10,600, hemoglobin 13.6, 85% neutrophils CXR 11/12/13 IMPRESSION:  No evidence of acute cardiopulmonary disease.  Electronically Signed  By: Julian Hy M.D.  On: 11/12/2013 14:00     Review of Systems-see HPI Constitutional:   No-   weight loss, night sweats, +fevers, no-chills, +fatigue, lassitude. HEENT:   No-  headaches, difficulty swallowing,  tooth/dental problems, +sore throat,       No-  sneezing, itching, ear ache, nasal congestion, post nasal drip,  CV:  No-chest pain, no-orthopnea, PND, No-swelling in lower extremities, anasarca, dizziness, palpitations Resp: + shortness of breath with exertion, not at rest.              No-productive cough,  + non-productive cough,  No-  coughing up of blood.             No-change in color of mucus.  No- wheezing.   Skin: No-   rash or lesions. GI:  No-   heartburn, indigestion, abdominal pain, nausea,   GU: No-   dysuria,  MS:  No-   joint pain or swelling.  Neuro-  Psych:  No- change in mood or affect. No acute depression or anxiety.  No memory loss.   OBJ- General- Alert, Oriented, Affect-appropriate, Distress- none acute. O2 4L.  Skin- rash-none, lesions- none, excoriation- none Lymphadenopathy- none Head- atraumatic            Eyes- Gross vision intact, PERRLA, conjunctivae clear secretions            Ears- Hearing, canals-normal            Nose- Clear, no-Septal dev, mucus, polyps, erosion, perforation             Throat- Mallampati II , mucosa clear , drainage- none, tonsils- atrophic.  Neck- flexible , trachea midline, no stridor , thyroid nl, carotid no bruit Chest - symmetrical excursion , unlabored           Heart/CV- RRR , no murmur , no gallop  , no rub, nl s1 s2                           - JVD- none , edema-none, stasis changes- none, varices- none           Lung-  very distant sounds/ clear, unlabored, cough+ deep , dullness-none, rub- none           Chest wall-  Abd-  Br/ Gen/ Rectal- Not done, not indicated Extrem- cyanosis- none, clubbing, none, atrophy- none, strength- nl.  Neuro- grossly intact to observation

## 2013-12-14 ENCOUNTER — Encounter: Payer: Self-pay | Admitting: Internal Medicine

## 2013-12-18 ENCOUNTER — Telehealth: Payer: Self-pay | Admitting: Internal Medicine

## 2013-12-18 ENCOUNTER — Encounter (HOSPITAL_COMMUNITY)
Admission: RE | Admit: 2013-12-18 | Discharge: 2013-12-18 | Disposition: A | Discharge status: Home / Self Care | Payer: Self-pay | Source: Ambulatory Visit | Attending: Internal Medicine | Admitting: Internal Medicine

## 2013-12-18 DIAGNOSIS — R0602 Shortness of breath: Secondary | ICD-10-CM | POA: Insufficient documentation

## 2013-12-18 DIAGNOSIS — Z5189 Encounter for other specified aftercare: Secondary | ICD-10-CM | POA: Insufficient documentation

## 2013-12-18 DIAGNOSIS — J984 Other disorders of lung: Secondary | ICD-10-CM | POA: Insufficient documentation

## 2013-12-18 DIAGNOSIS — J4489 Other specified chronic obstructive pulmonary disease: Secondary | ICD-10-CM | POA: Insufficient documentation

## 2013-12-18 DIAGNOSIS — J449 Chronic obstructive pulmonary disease, unspecified: Secondary | ICD-10-CM | POA: Insufficient documentation

## 2013-12-18 MED ORDER — BUDESONIDE-FORMOTEROL FUMARATE 160-4.5 MCG/ACT IN AERO: 2.0000 | INHALATION_SPRAY | Freq: Two times a day (BID) | RESPIRATORY_TRACT | Status: DC

## 2013-12-18 MED ORDER — BUDESONIDE 180 MCG/ACT IN AEPB: 2.0000 | INHALATION_SPRAY | Freq: Two times a day (BID) | RESPIRATORY_TRACT | Status: DC

## 2013-12-18 MED ORDER — TIOTROPIUM BROMIDE MONOHYDRATE 18 MCG IN CAPS: 18.0000 ug | ORAL_CAPSULE | Freq: Every day | RESPIRATORY_TRACT | Status: DC

## 2013-12-18 NOTE — Telephone Encounter (Signed)
Per CY-okay to give sample of each-Symbicort, Spiriva, and Pulmicort. I have documented the samples in EPIC. Pt aware the samples are at front for pick up.

## 2013-12-18 NOTE — Telephone Encounter (Signed)
Spoke with pt--Pulmicort given last week at last OV to be taken with Anoro. Pt advised to stop the Symbicort and Spiriva. Pt states that he stopped the Anoro and Pulmicort about 2 days ago and started back on the Symbicort and Spiriva because he felt like he was not improving, it was then that he noticed the Pulmicort inhaler may actually be broken so he was never being administered the medication. Pt states that the counter on the Pulmicort has not moved, is not counting down. Does not taste or feel like he was getting medication. Offered pt a new sample of the Pulmicort that he can come by and pick up. Refused.  Pt states that he is going out of town in 2 days, is asking if we can supply him with a new sample of the Pulmicort along with 1 samples of Symbicort and Spiriva. Pt states that he does not want to try to Pulmicort again until he gets back from vacation x 1 week and would like to continue on the Symbicort and Spiriva combination until he returns. Requests samples so that he does not have to waste a month supply if he only uses it for 1 week then switches back to the Pulmicort and Anoro.  Please advise Dr Annamaria Boots if this is okay if you have any other recommendations. Allergies  Allergen Reactions  . Tolectin [Tolmetin Sodium] Other (See Comments)    BP low and passed out  . Montelukast Sodium

## 2013-12-20 ENCOUNTER — Encounter: Payer: Self-pay | Admitting: Cardiovascular Disease

## 2013-12-20 ENCOUNTER — Encounter (HOSPITAL_COMMUNITY)
Admission: RE | Admit: 2013-12-20 | Discharge: 2013-12-20 | Disposition: A | Discharge status: Home / Self Care | Payer: Self-pay | Source: Ambulatory Visit | Attending: Internal Medicine | Admitting: Internal Medicine

## 2013-12-20 ENCOUNTER — Ambulatory Visit (INDEPENDENT_AMBULATORY_CARE_PROVIDER_SITE_OTHER): Payer: Medicare Other | Admitting: Cardiovascular Disease

## 2013-12-20 VITALS — BP 132/62 | HR 71 | Ht 69.0 in | Wt 175.4 lb

## 2013-12-20 DIAGNOSIS — R609 Edema, unspecified: Secondary | ICD-10-CM | POA: Diagnosis not present

## 2013-12-20 DIAGNOSIS — E785 Hyperlipidemia, unspecified: Secondary | ICD-10-CM

## 2013-12-20 DIAGNOSIS — I2510 Atherosclerotic heart disease of native coronary artery without angina pectoris: Secondary | ICD-10-CM

## 2013-12-20 NOTE — Patient Instructions (Signed)
Your physician has requested that you have an echocardiogram. Echocardiography is a painless test that uses sound waves to create images of your heart. It provides your doctor with information about the size and shape of your heart and how well your heart's chambers and valves are working. This procedure takes approximately one hour. There are no restrictions for this procedure.  Your physician recommends that you continue on your current medications as directed. Please refer to the Current Medication list given to you today.  Your physician wants you to follow-up in: 1 YEAR with Dr Cooper.  You will receive a reminder letter in the mail two months in advance. If you don't receive a letter, please call our office to schedule the follow-up appointment.    

## 2013-12-20 NOTE — Progress Notes (Signed)
HPI:  79 year old gentleman presenting for followup evaluation. He has been diagnosed with subclinical atherosclerosis based on CT imaging of the chest. Coronary calcifications were noted. There is also mild to moderate atherosclerotic calcification of the aorta. He underwent cardiac catheterization in 2003 that demonstrated nonobstructive coronary disease with the exception of an 80% ostial stenosis of a small ramus intermedius branch. His left ventricular function has been normal. He had an echocardiogram in 2013 that showed normal LV function with grade 1 diastolic dysfunction, moderate tricuspid regurgitation, and normal RV function with normal pulmonary pressures.  The patient has advanced lung disease. He is on 24/7 home oxygen. He denies chest pain or pressure. He has chronic lower extremity swelling, but denies orthopnea or PND. His dyspnea is unchanged since his visit last year. He also complains of easy bruising since he has been taking aspirin.  Outpatient Encounter Prescriptions as of 12/20/2013  Medication Sig  . albuterol (PROAIR HFA) 108 (90 BASE) MCG/ACT inhaler Inhale 2 puffs into the lungs 4 (four) times daily as needed for wheezing or shortness of breath.  [name] albuterol (PROVENTIL) (2.5 MG/3ML) 0.083% nebulizer solution Take 2.5 mg by nebulization every 6 (six) hours as needed.    [name] amLODipine (NORVASC) 5 MG tablet Take 5 mg by mouth daily.    [name] aspirin 81 MG tablet Take 81 mg by mouth daily.    . budesonide (PULMICORT FLEXHALER) 180 MCG/ACT inhaler Inhale 2 puffs into the lungs 2 (two) times daily.  . budesonide-formoterol (SYMBICORT) 160-4.5 MCG/ACT inhaler Inhale 2 puffs into the lungs 2 (two) times daily.  . digoxin (LANOXIN) 0.25 MG tablet Take 250 mcg by mouth daily.    [name] losartan (COZAAR) 50 MG tablet Take 1 tablet by mouth Daily.  . Multiple Vitamin (MULTIVITAMIN) capsule Take 1 capsule by mouth daily.    . pravastatin (PRAVACHOL) 40 MG tablet Take 1 tablet by mouth daily.   . promethazine-codeine (PHENERGAN WITH CODEINE) 6.25-10 MG/5ML syrup Take 5 mLs by mouth every 6 (six) hours as needed for cough.  . tiotropium (SPIRIVA) 18 MCG inhalation capsule Place 1 capsule (18 mcg total) into inhaler and inhale daily.  [name] Umeclidinium-Vilanterol (ANORO ELLIPTA) 62.5-25 MCG/INH AEPB Inhale 1 puff into the lungs daily.  . [DISCONTINUED] budesonide (PULMICORT) 180 MCG/ACT inhaler Inhale 2 puffs into the lungs 2 (two) times daily.  . [DISCONTINUED] budesonide-formoterol (SYMBICORT) 160-4.5 MCG/ACT inhaler Inhale 2 puffs into the lungs 2 (two) times daily.  . [DISCONTINUED] tiotropium (SPIRIVA) 18 MCG inhalation capsule Place 18 mcg into inhaler and inhale daily.    Allergies  Allergen Reactions  . Tolectin [Tolmetin Sodium] Other (See Comments)    BP low and passed out  . Montelukast Sodium     Past Medical History  Diagnosis Date  . COPD (chronic obstructive pulmonary disease)   . Allergic rhinitis, cause unspecified   . Pulmonary nodule   . Emphysema of lung     ROS: Negative except as per HPI  BP 132/62  Pulse 71  Ht 5\' 9"  (1.753 m)  Wt 79.561 kg (175 lb 6.4 oz)  BMI 25.89 kg/m2  PHYSICAL EXAM: Pt is alert and oriented, NAD, pleasant male on oxygen by nasal cannula HEENT: normal Neck: JVP - normal, carotids 2+= without bruits Lungs: Diminished breath sounds, but clear CV: RRR without murmur or gallop Abd: soft, NT, Positive BS, no hepatomegaly Ext: 1+ ankle edema bilaterally, distal pulses intact and equal Skin: warm/dry no rash  EKG:  Normal sinus rhythm 71  beats per minute, nonspecific ST abnormality  ASSESSMENT AND PLAN: 1. Coronary artery disease, native vessel. The patient has no anginal symptoms. I recommended that he remain on aspirin 81 mg daily for preventative measures considering his known CAD, hyperlipidemia, and hypertension. He is agreeable with this.  2. Remote SVT. No recent symptoms. He has been on digoxin for over 30 years. His  digoxin level is checked twice per year.  3. Hypertension. Blood pressure is well controlled on his current medical program. His amlodipine has been reduced to 2.5 mg daily.  4. Hyperlipidemia. Managed by his primary physician. Lipids from 10/08/2013 were reviewed. His cholesterol is 155, HDL 74, LDL 69, and triglycerides 61.  5. Edema. Possibly related to stasis or amlodipine. Will repeat echo as it has been 2 years since last study and he is at risk of right heart dysfunction in the setting of chronic lung disease.  For followup I will see him back in one year.  Sherren Mocha 12/20/2013 12:05 PM

## 2013-12-21 DIAGNOSIS — N400 Enlarged prostate without lower urinary tract symptoms: Secondary | ICD-10-CM | POA: Diagnosis not present

## 2013-12-21 DIAGNOSIS — R972 Elevated prostate specific antigen [PSA]: Secondary | ICD-10-CM | POA: Diagnosis not present

## 2013-12-25 ENCOUNTER — Encounter (HOSPITAL_COMMUNITY): Payer: Self-pay

## 2013-12-27 ENCOUNTER — Encounter (HOSPITAL_COMMUNITY): Payer: Self-pay

## 2014-01-01 ENCOUNTER — Encounter (HOSPITAL_COMMUNITY)
Admission: RE | Admit: 2014-01-01 | Discharge: 2014-01-01 | Disposition: A | Discharge status: Home / Self Care | Payer: Self-pay | Source: Ambulatory Visit | Attending: Internal Medicine | Admitting: Internal Medicine

## 2014-01-02 ENCOUNTER — Telehealth: Payer: Self-pay | Admitting: Internal Medicine

## 2014-01-02 MED ORDER — UMECLIDINIUM-VILANTEROL 62.5-25 MCG/INH IN AEPB: 1.0000 | INHALATION_SPRAY | Freq: Every day | RESPIRATORY_TRACT | Status: DC

## 2014-01-02 MED ORDER — BUDESONIDE 180 MCG/ACT IN AEPB: 2.0000 | INHALATION_SPRAY | Freq: Two times a day (BID) | RESPIRATORY_TRACT | Status: DC

## 2014-01-02 NOTE — Telephone Encounter (Signed)
Per last OV pt was given sample of pulmicort and anoro to try in place of spiriva and foradil to see if he like it better. He states he feels like these meds do better for him and he would like rx sent to his pharmacy. Rx sent. Pt advised that if anoro needs a PA we will take care of this once the pharmacy notifies [place] that it is needed or not. Pt states understanding. Santa Claus Bing, CMA

## 2014-01-03 ENCOUNTER — Encounter (HOSPITAL_COMMUNITY)
Admission: RE | Admit: 2014-01-03 | Discharge: 2014-01-03 | Disposition: A | Discharge status: Home / Self Care | Payer: Self-pay | Source: Ambulatory Visit | Attending: Internal Medicine | Admitting: Internal Medicine

## 2014-01-08 ENCOUNTER — Encounter (HOSPITAL_COMMUNITY)
Admission: RE | Admit: 2014-01-08 | Discharge: 2014-01-08 | Disposition: A | Discharge status: Home / Self Care | Payer: Self-pay | Source: Ambulatory Visit | Attending: Internal Medicine | Admitting: Internal Medicine

## 2014-01-08 ENCOUNTER — Ambulatory Visit (HOSPITAL_COMMUNITY): Payer: Medicare Other | Attending: Cardiology | Admitting: Radiology

## 2014-01-08 DIAGNOSIS — R609 Edema, unspecified: Secondary | ICD-10-CM | POA: Diagnosis not present

## 2014-01-08 DIAGNOSIS — I2510 Atherosclerotic heart disease of native coronary artery without angina pectoris: Secondary | ICD-10-CM | POA: Diagnosis not present

## 2014-01-08 NOTE — Progress Notes (Signed)
Echocardiogram performed.  

## 2014-01-09 NOTE — Assessment & Plan Note (Signed)
Acute exacerbation resolved. Discussed oxygen. Plan-sample Pulmicort 180 and sample Anoro. Try this combination instead of Symbicort with Spiriva for comparison.

## 2014-01-09 NOTE — Assessment & Plan Note (Signed)
CXR 11/12/13-IMPRESSION:  No evidence of acute cardiopulmonary disease.  Electronically Signed  By: Julian Hy M.D.  On: 11/12/2013 14:00 Low concern- discussed with him.

## 2014-01-10 ENCOUNTER — Encounter (HOSPITAL_COMMUNITY)
Admission: RE | Admit: 2014-01-10 | Discharge: 2014-01-10 | Disposition: A | Discharge status: Home / Self Care | Payer: Self-pay | Source: Ambulatory Visit | Attending: Internal Medicine | Admitting: Internal Medicine

## 2014-01-15 ENCOUNTER — Encounter (HOSPITAL_COMMUNITY)
Admission: RE | Admit: 2014-01-15 | Discharge: 2014-01-15 | Disposition: A | Discharge status: Home / Self Care | Payer: Self-pay | Source: Ambulatory Visit | Attending: Internal Medicine | Admitting: Internal Medicine

## 2014-01-15 DIAGNOSIS — J449 Chronic obstructive pulmonary disease, unspecified: Secondary | ICD-10-CM | POA: Insufficient documentation

## 2014-01-15 DIAGNOSIS — H02839 Dermatochalasis of unspecified eye, unspecified eyelid: Secondary | ICD-10-CM | POA: Diagnosis not present

## 2014-01-15 DIAGNOSIS — Z961 Presence of intraocular lens: Secondary | ICD-10-CM | POA: Diagnosis not present

## 2014-01-15 DIAGNOSIS — J4489 Other specified chronic obstructive pulmonary disease: Secondary | ICD-10-CM | POA: Insufficient documentation

## 2014-01-15 DIAGNOSIS — I10 Essential (primary) hypertension: Secondary | ICD-10-CM | POA: Diagnosis not present

## 2014-01-15 DIAGNOSIS — J984 Other disorders of lung: Secondary | ICD-10-CM | POA: Insufficient documentation

## 2014-01-15 DIAGNOSIS — R0602 Shortness of breath: Secondary | ICD-10-CM | POA: Insufficient documentation

## 2014-01-15 DIAGNOSIS — Z5189 Encounter for other specified aftercare: Secondary | ICD-10-CM | POA: Insufficient documentation

## 2014-01-15 DIAGNOSIS — H18419 Arcus senilis, unspecified eye: Secondary | ICD-10-CM | POA: Diagnosis not present

## 2014-01-17 ENCOUNTER — Encounter (HOSPITAL_COMMUNITY)
Admission: RE | Admit: 2014-01-17 | Discharge: 2014-01-17 | Disposition: A | Discharge status: Home / Self Care | Payer: Self-pay | Source: Ambulatory Visit | Attending: Internal Medicine | Admitting: Internal Medicine

## 2014-01-22 ENCOUNTER — Encounter (HOSPITAL_COMMUNITY)
Admission: RE | Admit: 2014-01-22 | Discharge: 2014-01-22 | Disposition: A | Discharge status: Home / Self Care | Payer: Self-pay | Source: Ambulatory Visit | Attending: Internal Medicine | Admitting: Internal Medicine

## 2014-01-24 ENCOUNTER — Encounter (HOSPITAL_COMMUNITY)
Admission: RE | Admit: 2014-01-24 | Discharge: 2014-01-24 | Disposition: A | Discharge status: Home / Self Care | Payer: Self-pay | Source: Ambulatory Visit | Attending: Internal Medicine | Admitting: Internal Medicine

## 2014-01-29 ENCOUNTER — Encounter (HOSPITAL_COMMUNITY)
Admission: RE | Admit: 2014-01-29 | Discharge: 2014-01-29 | Disposition: A | Discharge status: Home / Self Care | Payer: Self-pay | Source: Ambulatory Visit | Attending: Internal Medicine | Admitting: Internal Medicine

## 2014-01-29 DIAGNOSIS — M719 Bursopathy, unspecified: Secondary | ICD-10-CM | POA: Diagnosis not present

## 2014-01-29 DIAGNOSIS — M67919 Unspecified disorder of synovium and tendon, unspecified shoulder: Secondary | ICD-10-CM | POA: Diagnosis not present

## 2014-01-31 ENCOUNTER — Encounter (HOSPITAL_COMMUNITY)
Admission: RE | Admit: 2014-01-31 | Discharge: 2014-01-31 | Disposition: A | Discharge status: Home / Self Care | Payer: Self-pay | Source: Ambulatory Visit | Attending: Internal Medicine | Admitting: Internal Medicine

## 2014-02-05 ENCOUNTER — Encounter (HOSPITAL_COMMUNITY)
Admission: RE | Admit: 2014-02-05 | Discharge: 2014-02-05 | Disposition: A | Discharge status: Home / Self Care | Payer: Self-pay | Source: Ambulatory Visit | Attending: Internal Medicine | Admitting: Internal Medicine

## 2014-02-07 ENCOUNTER — Encounter (HOSPITAL_COMMUNITY)
Admission: RE | Admit: 2014-02-07 | Discharge: 2014-02-07 | Disposition: A | Discharge status: Home / Self Care | Payer: Self-pay | Source: Ambulatory Visit | Attending: Internal Medicine | Admitting: Internal Medicine

## 2014-02-11 ENCOUNTER — Telehealth: Payer: Self-pay | Admitting: Internal Medicine

## 2014-02-11 NOTE — Telephone Encounter (Signed)
Called and spoke with the pharmacy and they had been sending the PA form for the anoro  To the wrong fax #.   Waiting on the fax now and i have called the pt and he is aware that we will get this done today.

## 2014-02-11 NOTE — Telephone Encounter (Signed)
Form has been received from the pharmacy but not able to get through to initiate the PA.  Will try back later.

## 2014-02-12 ENCOUNTER — Encounter (HOSPITAL_COMMUNITY): Payer: Self-pay

## 2014-02-12 ENCOUNTER — Telehealth: Payer: Self-pay | Admitting: Internal Medicine

## 2014-02-12 NOTE — Telephone Encounter (Signed)
Called and spoke with  The pharmacy and they stated that they cannot get the anoro to go through since the pt paid out of pocket for the medication.  They needed me to call back and get the date for the PA changed to 02/08/14 since this is when the rx was sent in.  i have called [facility] and this has been back dated for the pt so the pharmacy will be able to refund the pt what he paid for out of pocket.    Called and spoke with pt and he is aware of the PA that has been back dated to 6/26 and nothing further is needed.

## 2014-02-12 NOTE — Telephone Encounter (Signed)
anoro has been approved through 08/15/14.   [id number].  i have called pt and he is aware.  i have called the pharmacy and they are aware.

## 2014-02-14 ENCOUNTER — Encounter (HOSPITAL_COMMUNITY)
Admission: RE | Admit: 2014-02-14 | Discharge: 2014-02-14 | Disposition: A | Discharge status: Home / Self Care | Payer: Self-pay | Source: Ambulatory Visit | Attending: Internal Medicine | Admitting: Internal Medicine

## 2014-02-14 DIAGNOSIS — J4489 Other specified chronic obstructive pulmonary disease: Secondary | ICD-10-CM | POA: Insufficient documentation

## 2014-02-14 DIAGNOSIS — J449 Chronic obstructive pulmonary disease, unspecified: Secondary | ICD-10-CM | POA: Insufficient documentation

## 2014-02-14 DIAGNOSIS — R0602 Shortness of breath: Secondary | ICD-10-CM | POA: Insufficient documentation

## 2014-02-14 DIAGNOSIS — J984 Other disorders of lung: Secondary | ICD-10-CM | POA: Insufficient documentation

## 2014-02-14 DIAGNOSIS — Z5189 Encounter for other specified aftercare: Secondary | ICD-10-CM | POA: Insufficient documentation

## 2014-02-19 ENCOUNTER — Encounter (HOSPITAL_COMMUNITY)
Admission: RE | Admit: 2014-02-19 | Discharge: 2014-02-19 | Disposition: A | Discharge status: Home / Self Care | Payer: Self-pay | Source: Ambulatory Visit | Attending: Internal Medicine | Admitting: Internal Medicine

## 2014-02-21 ENCOUNTER — Encounter (HOSPITAL_COMMUNITY)
Admission: RE | Admit: 2014-02-21 | Discharge: 2014-02-21 | Disposition: A | Discharge status: Home / Self Care | Payer: Self-pay | Source: Ambulatory Visit | Attending: Internal Medicine | Admitting: Internal Medicine

## 2014-02-21 ENCOUNTER — Other Ambulatory Visit: Payer: Self-pay | Admitting: Dermatology

## 2014-02-21 DIAGNOSIS — Z85828 Personal history of other malignant neoplasm of skin: Secondary | ICD-10-CM | POA: Diagnosis not present

## 2014-02-21 DIAGNOSIS — L851 Acquired keratosis [keratoderma] palmaris et plantaris: Secondary | ICD-10-CM | POA: Diagnosis not present

## 2014-02-21 DIAGNOSIS — L821 Other seborrheic keratosis: Secondary | ICD-10-CM | POA: Diagnosis not present

## 2014-02-21 DIAGNOSIS — D239 Other benign neoplasm of skin, unspecified: Secondary | ICD-10-CM | POA: Diagnosis not present

## 2014-02-21 DIAGNOSIS — D485 Neoplasm of uncertain behavior of skin: Secondary | ICD-10-CM | POA: Diagnosis not present

## 2014-02-26 ENCOUNTER — Encounter (HOSPITAL_COMMUNITY)
Admission: RE | Admit: 2014-02-26 | Discharge: 2014-02-26 | Disposition: A | Discharge status: Home / Self Care | Payer: Self-pay | Source: Ambulatory Visit | Attending: Internal Medicine | Admitting: Internal Medicine

## 2014-02-28 ENCOUNTER — Encounter (HOSPITAL_COMMUNITY): Payer: Self-pay

## 2014-03-05 ENCOUNTER — Encounter (HOSPITAL_COMMUNITY)
Admission: RE | Admit: 2014-03-05 | Discharge: 2014-03-05 | Disposition: A | Discharge status: Home / Self Care | Payer: Self-pay | Source: Ambulatory Visit | Attending: Internal Medicine | Admitting: Internal Medicine

## 2014-03-07 ENCOUNTER — Encounter (HOSPITAL_COMMUNITY)
Admission: RE | Admit: 2014-03-07 | Discharge: 2014-03-07 | Disposition: A | Discharge status: Home / Self Care | Payer: Self-pay | Source: Ambulatory Visit | Attending: Internal Medicine | Admitting: Internal Medicine

## 2014-03-12 ENCOUNTER — Encounter (HOSPITAL_COMMUNITY)
Admission: RE | Admit: 2014-03-12 | Discharge: 2014-03-12 | Disposition: A | Discharge status: Home / Self Care | Payer: Self-pay | Source: Ambulatory Visit | Attending: Internal Medicine | Admitting: Internal Medicine

## 2014-03-14 ENCOUNTER — Encounter (HOSPITAL_COMMUNITY)
Admission: RE | Admit: 2014-03-14 | Discharge: 2014-03-14 | Disposition: A | Discharge status: Home / Self Care | Payer: Self-pay | Source: Ambulatory Visit | Attending: Internal Medicine | Admitting: Internal Medicine

## 2014-03-19 ENCOUNTER — Encounter (HOSPITAL_COMMUNITY)
Admission: RE | Admit: 2014-03-19 | Discharge: 2014-03-19 | Disposition: A | Discharge status: Home / Self Care | Payer: Self-pay | Source: Ambulatory Visit | Attending: Internal Medicine | Admitting: Internal Medicine

## 2014-03-19 DIAGNOSIS — Z5189 Encounter for other specified aftercare: Secondary | ICD-10-CM | POA: Insufficient documentation

## 2014-03-19 DIAGNOSIS — R0602 Shortness of breath: Secondary | ICD-10-CM | POA: Insufficient documentation

## 2014-03-19 DIAGNOSIS — J984 Other disorders of lung: Secondary | ICD-10-CM | POA: Insufficient documentation

## 2014-03-19 DIAGNOSIS — J4489 Other specified chronic obstructive pulmonary disease: Secondary | ICD-10-CM | POA: Insufficient documentation

## 2014-03-19 DIAGNOSIS — J449 Chronic obstructive pulmonary disease, unspecified: Secondary | ICD-10-CM | POA: Insufficient documentation

## 2014-03-21 ENCOUNTER — Encounter (HOSPITAL_COMMUNITY)
Admission: RE | Admit: 2014-03-21 | Discharge: 2014-03-21 | Disposition: A | Discharge status: Home / Self Care | Payer: Self-pay | Source: Ambulatory Visit | Attending: Internal Medicine | Admitting: Internal Medicine

## 2014-03-26 ENCOUNTER — Encounter (HOSPITAL_COMMUNITY)
Admission: RE | Admit: 2014-03-26 | Discharge: 2014-03-26 | Disposition: A | Discharge status: Home / Self Care | Payer: Self-pay | Source: Ambulatory Visit | Attending: Internal Medicine | Admitting: Internal Medicine

## 2014-03-28 ENCOUNTER — Encounter (HOSPITAL_COMMUNITY): Payer: Self-pay

## 2014-04-02 ENCOUNTER — Encounter (HOSPITAL_COMMUNITY)
Admission: RE | Admit: 2014-04-02 | Discharge: 2014-04-02 | Disposition: A | Discharge status: Home / Self Care | Payer: Self-pay | Source: Ambulatory Visit | Attending: Internal Medicine | Admitting: Internal Medicine

## 2014-04-04 ENCOUNTER — Encounter (HOSPITAL_COMMUNITY): Payer: Self-pay

## 2014-04-09 ENCOUNTER — Encounter (HOSPITAL_COMMUNITY)
Admission: RE | Admit: 2014-04-09 | Discharge: 2014-04-09 | Disposition: A | Discharge status: Home / Self Care | Payer: Self-pay | Source: Ambulatory Visit | Attending: Internal Medicine | Admitting: Internal Medicine

## 2014-04-10 DIAGNOSIS — Z23 Encounter for immunization: Secondary | ICD-10-CM | POA: Diagnosis not present

## 2014-04-11 ENCOUNTER — Encounter (HOSPITAL_COMMUNITY): Payer: Self-pay

## 2014-04-16 ENCOUNTER — Encounter (HOSPITAL_COMMUNITY)
Admission: RE | Admit: 2014-04-16 | Discharge: 2014-04-16 | Disposition: A | Discharge status: Home / Self Care | Payer: Self-pay | Source: Ambulatory Visit | Attending: Internal Medicine | Admitting: Internal Medicine

## 2014-04-16 DIAGNOSIS — J984 Other disorders of lung: Secondary | ICD-10-CM | POA: Insufficient documentation

## 2014-04-16 DIAGNOSIS — Z5189 Encounter for other specified aftercare: Secondary | ICD-10-CM | POA: Insufficient documentation

## 2014-04-16 DIAGNOSIS — J4489 Other specified chronic obstructive pulmonary disease: Secondary | ICD-10-CM | POA: Insufficient documentation

## 2014-04-16 DIAGNOSIS — J449 Chronic obstructive pulmonary disease, unspecified: Secondary | ICD-10-CM | POA: Insufficient documentation

## 2014-04-16 DIAGNOSIS — R0602 Shortness of breath: Secondary | ICD-10-CM | POA: Insufficient documentation

## 2014-04-18 ENCOUNTER — Encounter (HOSPITAL_COMMUNITY): Payer: Self-pay

## 2014-04-19 DIAGNOSIS — R7309 Other abnormal glucose: Secondary | ICD-10-CM | POA: Diagnosis not present

## 2014-04-23 ENCOUNTER — Encounter (HOSPITAL_COMMUNITY)
Admission: RE | Admit: 2014-04-23 | Discharge: 2014-04-23 | Disposition: A | Discharge status: Home / Self Care | Payer: Self-pay | Source: Ambulatory Visit | Attending: Internal Medicine | Admitting: Internal Medicine

## 2014-04-25 ENCOUNTER — Encounter (HOSPITAL_COMMUNITY)
Admission: RE | Admit: 2014-04-25 | Discharge: 2014-04-25 | Disposition: A | Discharge status: Home / Self Care | Payer: Self-pay | Source: Ambulatory Visit | Attending: Internal Medicine | Admitting: Internal Medicine

## 2014-04-26 DIAGNOSIS — M25519 Pain in unspecified shoulder: Secondary | ICD-10-CM | POA: Diagnosis not present

## 2014-04-26 DIAGNOSIS — J449 Chronic obstructive pulmonary disease, unspecified: Secondary | ICD-10-CM | POA: Diagnosis not present

## 2014-04-26 DIAGNOSIS — I10 Essential (primary) hypertension: Secondary | ICD-10-CM | POA: Diagnosis not present

## 2014-04-26 DIAGNOSIS — I471 Supraventricular tachycardia: Secondary | ICD-10-CM | POA: Diagnosis not present

## 2014-04-30 ENCOUNTER — Encounter (HOSPITAL_COMMUNITY)
Admission: RE | Admit: 2014-04-30 | Discharge: 2014-04-30 | Disposition: A | Discharge status: Home / Self Care | Payer: Self-pay | Source: Ambulatory Visit | Attending: Internal Medicine | Admitting: Internal Medicine

## 2014-05-02 ENCOUNTER — Encounter (HOSPITAL_COMMUNITY)
Admission: RE | Admit: 2014-05-02 | Discharge: 2014-05-02 | Disposition: A | Discharge status: Home / Self Care | Payer: Self-pay | Source: Ambulatory Visit | Attending: Internal Medicine | Admitting: Internal Medicine

## 2014-05-07 ENCOUNTER — Encounter (HOSPITAL_COMMUNITY)
Admission: RE | Admit: 2014-05-07 | Discharge: 2014-05-07 | Disposition: A | Discharge status: Home / Self Care | Payer: Self-pay | Source: Ambulatory Visit | Attending: Internal Medicine | Admitting: Internal Medicine

## 2014-05-09 ENCOUNTER — Encounter (HOSPITAL_COMMUNITY)
Admission: RE | Admit: 2014-05-09 | Discharge: 2014-05-09 | Disposition: A | Discharge status: Home / Self Care | Payer: Self-pay | Source: Ambulatory Visit | Attending: Internal Medicine | Admitting: Internal Medicine

## 2014-05-14 ENCOUNTER — Encounter (HOSPITAL_COMMUNITY): Payer: Self-pay

## 2014-05-16 ENCOUNTER — Encounter (HOSPITAL_COMMUNITY): Payer: Medicare Other

## 2014-05-16 DIAGNOSIS — R0602 Shortness of breath: Secondary | ICD-10-CM | POA: Insufficient documentation

## 2014-05-16 DIAGNOSIS — Z5189 Encounter for other specified aftercare: Secondary | ICD-10-CM | POA: Insufficient documentation

## 2014-05-16 DIAGNOSIS — J449 Chronic obstructive pulmonary disease, unspecified: Secondary | ICD-10-CM | POA: Insufficient documentation

## 2014-05-16 DIAGNOSIS — J984 Other disorders of lung: Secondary | ICD-10-CM | POA: Insufficient documentation

## 2014-05-21 ENCOUNTER — Encounter (HOSPITAL_COMMUNITY)
Admission: RE | Admit: 2014-05-21 | Discharge: 2014-05-21 | Disposition: A | Discharge status: Home / Self Care | Payer: Self-pay | Source: Ambulatory Visit | Attending: Internal Medicine | Admitting: Internal Medicine

## 2014-05-23 ENCOUNTER — Encounter (HOSPITAL_COMMUNITY)
Admission: RE | Admit: 2014-05-23 | Discharge: 2014-05-23 | Disposition: A | Discharge status: Home / Self Care | Payer: Self-pay | Source: Ambulatory Visit | Attending: Internal Medicine | Admitting: Internal Medicine

## 2014-05-28 ENCOUNTER — Encounter (HOSPITAL_COMMUNITY)
Admission: RE | Admit: 2014-05-28 | Discharge: 2014-05-28 | Disposition: A | Discharge status: Home / Self Care | Payer: Self-pay | Source: Ambulatory Visit | Attending: Internal Medicine | Admitting: Internal Medicine

## 2014-05-30 ENCOUNTER — Encounter (HOSPITAL_COMMUNITY)
Admission: RE | Admit: 2014-05-30 | Discharge: 2014-05-30 | Disposition: A | Discharge status: Home / Self Care | Payer: Self-pay | Source: Ambulatory Visit | Attending: Internal Medicine | Admitting: Internal Medicine

## 2014-06-04 ENCOUNTER — Encounter (HOSPITAL_COMMUNITY)
Admission: RE | Admit: 2014-06-04 | Discharge: 2014-06-04 | Disposition: A | Discharge status: Home / Self Care | Payer: Self-pay | Source: Ambulatory Visit | Attending: Internal Medicine | Admitting: Internal Medicine

## 2014-06-13 ENCOUNTER — Ambulatory Visit: Payer: Medicare Other | Admitting: Internal Medicine

## 2014-06-13 ENCOUNTER — Encounter: Payer: Self-pay | Admitting: Internal Medicine

## 2014-06-13 ENCOUNTER — Other Ambulatory Visit: Payer: Self-pay | Admitting: Internal Medicine

## 2014-06-13 VITALS — BP 108/60 | HR 76

## 2014-06-13 DIAGNOSIS — J439 Emphysema, unspecified: Secondary | ICD-10-CM

## 2014-06-13 MED ORDER — TIOTROPIUM BROMIDE MONOHYDRATE 2.5 MCG/ACT IN AERS: INHALATION_SPRAY | RESPIRATORY_TRACT | Status: DC

## 2014-06-13 MED ORDER — BUDESONIDE-FORMOTEROL FUMARATE 160-4.5 MCG/ACT IN AERO: 2.0000 | INHALATION_SPRAY | Freq: Two times a day (BID) | RESPIRATORY_TRACT | Status: DC

## 2014-06-13 MED ORDER — AEROCHAMBER MV MISC: Status: DC

## 2014-06-13 NOTE — Progress Notes (Signed)
Patient ID: [name], male    DOB: 06-13-1935, 79 y.o.   MRN: [id number] PCP Dr Hulan Fess HPI 01/25/11- 24 yo M former smoker followed for COPD/ emphysema with recent concern for pulmonary nodules. Last here May 20, 2010- note reviewed. CT done during the winter showed nodules.  PET positive nodule Left lung faded before needle bx. He had gone to [facility] for second opinion w/ Dr [name] where severity of his advanced lung disease was emphasized.  lung volume reduction and transplant were mentioned although I don't know how seriously, given his age.[name] He comes today to update and to have conference including his wife:  Conference on overall status: He had concern about amount of radiation from imaging and I reminded him that is why we chose to do CXR 05/20/10 which showed stable COPD. PFT 07/01/05 - FEV1 1.13/ 0.39; FEV1/FVC 0.25; DLCO 0.52; insignif response to bronchodilator. PFT at [facility] 11/26/10- FEV1 0.82/ 0.25; FEV1/FVC 0.22 Quality of life and prognosis discussed. He has noted some decline over past year. He could walk through [employer] without O2 then and no longer can. Emphysema pattern with little cough, wheeze, phlegm. No chest pain, palpitation or ankle edema. After long discussion we agreed to an inquiry to [facility] Transplant coordinator for consideration. I told him his age made acceptance unlikely. We discussed limited role of meds,He has had Pneumovax, annual flu vax, Pulmonary Rehab, and test normal for a1AT in the past. Has O2 2l/m for sleep and exertion, home nebulizer. He asks another script for prednisone to hold and we discussed side effects.   05/20/11 79 yo M former smoker followed for COPD/ emphysema with recent concern for pulmonary nodules. .. wife here He and his wife come today for an extended review of his early experience with [facility] lung transplant team evaluation. PFT- [facility] 04/27/11- FEV1 0.78/23%, FEV1/FVC 0.27, TLC 98%, RV 163%, DLCO 37%. ABG-room air-04/27/2011  pH 7.42, PCO2 40, PO2 63, bicarbonate 26, O2 saturation 91.9% 6 minute walk test 04/27/2011: 314.6 m (60% to distance predicted) with significant dyspnea while wearing oxygen at 3 L. He came away with significant reservations about the prospects for transplant. He asked for my estimate of his life expectancy without transplant. I suggested that barring an intervening acute illness, an additional 5 years is possible. He understands this is a very loose estimate. I deferred questions about hepatitis B vaccination and testosterone replacement to his primary physician. He wanted to know what else we might do to test and treat him if he chose not to seek transplant. We will watch his lung nodules with chest x-ray and/or CT, but our ability to intervene would be limited. If he does not have transplant, I don't know that cardiac stress test or repeat pulmonary function tests would add much useful  information unless symptoms change.  10/20/11- 26 yo M former smoker followed for COPD/ emphysema with recent concern for pulmonary nodules [facility] CXR 04/27/11- COPD with no mention of nodules. Followup at [facility] pulmonary has been deferred until September. They want stress test. He is leaning away from transplant at this time of blood and he wanted my thoughts. Discussed his left upper lobe nodule, atherosclerosis on CT scan in April of 2012. He continues pulmonary rehabilitation. Oxygen saturation on 3 L is 96% during exercise. He feels his current life quality is okay. Denies chest pain, acute discomfort, edema. Little cough or phlegm.  05/04/12-  41 yo M former smoker followed for COPD/ emphysema with  recent concern for pulmonary nodules [facility] CXR 04/27/11- COPD with no mention of nodules. Sob same, cough yellow to clear, no wheezing, no fcs He talked about another meeting with the [place] transplant doctors. At this point he is less interested in lung transplant as an option. Echocardiogram 12/16/2011 showed EF 65-70%, no  pulmonary hypertension, grade 1 diastolic dysfunction. [name] increased nocturia. Oxygen is used with sleep and exertion  11/02/12- 35 yo M former smoker followed for COPD/ emphysema with recent concern for pulmonary nodules FOLLOWS FOR: states breathing is about the same since last visit; would like [name] inhaler sample ( his current insurance will cover); also needs refill for [facility] HFA. Stable through the winter with no acute infection. Remains on oxygen now, especially with exertion. Has used Spiriva but would like to try [place] again. a1AT normal MM. CT 05/19/12- images reviewed again with him IMPRESSION:  1. Left upper lobe nodule is stable from 12/11/2010. Additional  follow-up in one year is recommended to document 2 years of  stability. This recommendation follows the consensus statement:  Guidelines for Management of Small Pulmonary Nodules Detected on CT  Scans: A Statement from the [place] as published in  Radiology 2005; 237:395-400.  2. Additional scattered pulmonary nodules are stable and therefore  considered benign.  3. Previously seen left upper lobe subpleural airspace  consolidation has resolved in the interval.  Original Report Authenticated By: [name], M.D.  12/15/12- 28 yo M former smoker followed for COPD/ emphysema with recent concern for pulmonary nodules FOLLOWS FOR: pt reports x 2wks had a scratchy throat, nonprod cough, fatigue -- statrted taking mucinex x3days seems to help some [name] at pulmonary rehabilitation sent him here. O2 3L/ [employer]  01/11/13 79 yo M former smoker followed for COPD/ emphysema with recent concern for pulmonary nodules   Wife here ACUTE VISIT: fevers, increased SOB, no energy, slight sore throat (not bad), cough-mostly dry.Pain in left side-unsure if from cough. At beach last week. Feels ill. May have gotten chills. Now fever x5 days, 101-102. Increased his oxygen to 4 L. Short of breath, no energy, mild sore  throat, dry cough, tussive left chest pain. No dysuria or nausea. Saw PCP on Tuesday, suspected viral infection.  06/05/13- 17 yo M former smoker followed for COPD/ emphysema, pulmonary nodules        FOLLOWS FOR:  Breathing is unchanged since last OV.  Discuss CT scan No signif  Change. Slow recovery from pneumonia- xrays reviewed.Little cough, no phlegm. Trying Breo.  Ankles swell some. No chest pain or palpitation. O2 2-3 L/ [employer] CT 05/17/13  Also notes atherosclerosis aorta IMPRESSION:  1. Stable uptake stress set left upper lobe nodule is stable in size  from previous exam. Interval scarring and architectural distortion  within this area is likely the sequelae of inflammation or  infection.  2. The other previously referenced nodules are unchanged from  previous exam and are most likely benign.  3. Emphysema.  Electronically Signed  By: Kerby Moors M.D.  On: 05/17/2013 12:04  11/12/13- 89 yo M former smoker followed for COPD/ emphysema, pulmonary nodules  ACUTE VISIT:  Fever and congestion with cough and brown mucus.  Also, has wheezing  wife here More short of breath over the weekend with stuffy nose. Temperature 100.1 last night. Started Z-Pak yesterday. Much cough productive brown. More short of breath, no energy, malaise.   12/13/13- 32 yo M former smoker followed for COPD/ emphysema, pulmonary nodules  FOLLOWS FOR:  Breathing  improved since last OV.  Still having sob with exertion Comfortable off of oxygen while sitting quietly but needs oxygen at 3 L/ [employer] for any exertion and for sleep. CBC w/ diff 11/12/13- WBC 10,600, hemoglobin 13.6, 85% neutrophils CXR 11/12/13 IMPRESSION:  No evidence of acute cardiopulmonary disease.  Electronically Signed  By: Julian Hy M.D.  On: 11/12/2013 14:00  06/13/14- 21 yo M former smoker followed for COPD/ emphysema, pulmonary nodules , complicated by CAD, hx lung nodules FOLLOWS FOR: patient states trouble with breathing  worsening last 3 days.  Patient has had wheezing; he denies chest tightness.        Review of Systems-see HPI Constitutional:   No-   weight loss, night sweats, +fevers, no-chills, +fatigue, lassitude. HEENT:   No-  headaches, difficulty swallowing, tooth/dental problems, +sore throat,       No-  sneezing, itching, ear ache, nasal congestion, post nasal drip,  CV:  No-chest pain, no-orthopnea, PND, No-swelling in lower extremities, anasarca, dizziness, palpitations Resp: + shortness of breath with exertion, not at rest.              No-productive cough,  + non-productive cough,  No-  coughing up of blood.             No-change in color of mucus.  No- wheezing.   Skin: No-   rash or lesions. GI:  No-   heartburn, indigestion, abdominal pain, nausea,   GU: No-   dysuria,  MS:  No-   joint pain or swelling.  Neuro-  Psych:  No- change in mood or affect. No acute depression or anxiety.  No memory loss.   OBJ- General- Alert, Oriented, Affect-appropriate, Distress- none acute. O2 4L.  Skin- rash-none, lesions- none, excoriation- none Lymphadenopathy- none Head- atraumatic            Eyes- Gross vision intact, PERRLA, conjunctivae clear secretions            Ears- Hearing, canals-normal            Nose- Clear, no-Septal dev, mucus, polyps, erosion, perforation             Throat- Mallampati II , mucosa clear , drainage- none, tonsils- atrophic.  Neck- flexible , trachea midline, no stridor , thyroid nl, carotid no bruit Chest - symmetrical excursion , unlabored           Heart/CV- RRR , no murmur , no gallop  , no rub, nl s1 s2                           - JVD- none , edema-none, stasis changes- none, varices- none           Lung-  very distant sounds/ clear, unlabored, cough+ deep , dullness-none, rub- none           Chest wall-  Abd-  Br/ Gen/ Rectal- Not done, not indicated Extrem- cyanosis- none, clubbing, none, atrophy- none, strength- nl.  Neuro- grossly intact to  observation

## 2014-06-13 NOTE — Patient Instructions (Signed)
Samples and script Symbicort 160   2 puffs then rinse mouth twice daily maintenance    Try this instead of Anoro/ Pulmicort  Sample and script Spiriva Respimat  2 puffs, once daily

## 2014-06-18 ENCOUNTER — Telehealth: Payer: Self-pay | Admitting: Internal Medicine

## 2014-06-18 ENCOUNTER — Encounter (HOSPITAL_COMMUNITY)
Admission: RE | Admit: 2014-06-18 | Discharge: 2014-06-18 | Disposition: A | Discharge status: Home / Self Care | Payer: Self-pay | Source: Ambulatory Visit | Attending: Internal Medicine | Admitting: Internal Medicine

## 2014-06-18 DIAGNOSIS — Z5189 Encounter for other specified aftercare: Secondary | ICD-10-CM | POA: Insufficient documentation

## 2014-06-18 DIAGNOSIS — J449 Chronic obstructive pulmonary disease, unspecified: Secondary | ICD-10-CM | POA: Insufficient documentation

## 2014-06-18 DIAGNOSIS — J984 Other disorders of lung: Secondary | ICD-10-CM | POA: Insufficient documentation

## 2014-06-18 DIAGNOSIS — R0602 Shortness of breath: Secondary | ICD-10-CM | POA: Insufficient documentation

## 2014-06-18 NOTE — Telephone Encounter (Signed)
Pt aware of sample per CY-patient states he was under the impression that CY would give him 2 samples to last 1 month as he did with the Symbicort at his OV. I explained to patient that we don't supply patients with month supply of meds and would need to verify this with CY. Pt aget a ware we will call him back tomorrow with CY's answer and set up time to teach inhaler to him.  No samples given at this time.

## 2014-06-18 NOTE — Telephone Encounter (Signed)
Called spoke with pt. He reports on 10/29 he was suppose to be given samples of spiriva resp but did not receive this. Pt is wanting to know if he now can have sample. Please advise Dr. Annamaria Boots thanks

## 2014-06-18 NOTE — Telephone Encounter (Signed)
Ok sample and instruction Spiriva Respimat    Inhale 2 puffs, one time daily

## 2014-06-19 MED ORDER — TIOTROPIUM BROMIDE MONOHYDRATE 2.5 MCG/ACT IN AERS: INHALATION_SPRAY | RESPIRATORY_TRACT | Status: DC

## 2014-06-19 NOTE — Telephone Encounter (Signed)
If we have 2 of the Spiriva Respimats, he may have them.

## 2014-06-19 NOTE — Telephone Encounter (Signed)
Pt aware 2 samples left for pick up. Nothing further needed

## 2014-06-20 ENCOUNTER — Encounter (HOSPITAL_COMMUNITY): Payer: Self-pay

## 2014-06-25 ENCOUNTER — Encounter (HOSPITAL_COMMUNITY)
Admission: RE | Admit: 2014-06-25 | Discharge: 2014-06-25 | Disposition: A | Discharge status: Home / Self Care | Payer: Self-pay | Source: Ambulatory Visit | Attending: Internal Medicine | Admitting: Internal Medicine

## 2014-06-27 ENCOUNTER — Encounter (HOSPITAL_COMMUNITY)
Admission: RE | Admit: 2014-06-27 | Discharge: 2014-06-27 | Disposition: A | Discharge status: Home / Self Care | Payer: Self-pay | Source: Ambulatory Visit | Attending: Internal Medicine | Admitting: Internal Medicine

## 2014-07-02 ENCOUNTER — Encounter (HOSPITAL_COMMUNITY)
Admission: RE | Admit: 2014-07-02 | Discharge: 2014-07-02 | Disposition: A | Discharge status: Home / Self Care | Payer: Self-pay | Source: Ambulatory Visit | Attending: Internal Medicine | Admitting: Internal Medicine

## 2014-07-04 ENCOUNTER — Encounter (HOSPITAL_COMMUNITY)
Admission: RE | Admit: 2014-07-04 | Discharge: 2014-07-04 | Disposition: A | Discharge status: Home / Self Care | Payer: Self-pay | Source: Ambulatory Visit | Attending: Internal Medicine | Admitting: Internal Medicine

## 2014-07-09 ENCOUNTER — Encounter (HOSPITAL_COMMUNITY): Payer: Self-pay

## 2014-07-11 ENCOUNTER — Encounter (HOSPITAL_COMMUNITY): Payer: Self-pay

## 2014-07-16 ENCOUNTER — Encounter (HOSPITAL_COMMUNITY)
Admission: RE | Admit: 2014-07-16 | Discharge: 2014-07-16 | Disposition: A | Discharge status: Home / Self Care | Payer: Self-pay | Source: Ambulatory Visit | Attending: Internal Medicine | Admitting: Internal Medicine

## 2014-07-16 DIAGNOSIS — J984 Other disorders of lung: Secondary | ICD-10-CM | POA: Insufficient documentation

## 2014-07-16 DIAGNOSIS — J449 Chronic obstructive pulmonary disease, unspecified: Secondary | ICD-10-CM | POA: Insufficient documentation

## 2014-07-16 DIAGNOSIS — Z5189 Encounter for other specified aftercare: Secondary | ICD-10-CM | POA: Insufficient documentation

## 2014-07-16 DIAGNOSIS — R0602 Shortness of breath: Secondary | ICD-10-CM | POA: Insufficient documentation

## 2014-07-18 ENCOUNTER — Encounter (HOSPITAL_COMMUNITY)
Admission: RE | Admit: 2014-07-18 | Discharge: 2014-07-18 | Disposition: A | Discharge status: Home / Self Care | Payer: Self-pay | Source: Ambulatory Visit | Attending: Internal Medicine | Admitting: Internal Medicine

## 2014-07-23 ENCOUNTER — Encounter (HOSPITAL_COMMUNITY): Admission: RE | Admit: 2014-07-23 | Payer: Self-pay | Source: Ambulatory Visit

## 2014-07-25 ENCOUNTER — Encounter (HOSPITAL_COMMUNITY): Payer: Self-pay

## 2014-07-30 ENCOUNTER — Encounter (HOSPITAL_COMMUNITY): Payer: Self-pay

## 2014-08-01 ENCOUNTER — Encounter (HOSPITAL_COMMUNITY)
Admission: RE | Admit: 2014-08-01 | Discharge: 2014-08-01 | Disposition: A | Discharge status: Home / Self Care | Payer: Self-pay | Source: Ambulatory Visit | Attending: Internal Medicine | Admitting: Internal Medicine

## 2014-08-05 ENCOUNTER — Telehealth: Payer: Self-pay | Admitting: Internal Medicine

## 2014-08-05 MED ORDER — ALBUTEROL SULFATE (2.5 MG/3ML) 0.083% IN NEBU: INHALATION_SOLUTION | RESPIRATORY_TRACT | Status: DC

## 2014-08-05 NOTE — Telephone Encounter (Signed)
I have sent refill for albuterol neb sol to [facility]

## 2014-08-06 ENCOUNTER — Encounter (HOSPITAL_COMMUNITY)
Admission: RE | Admit: 2014-08-06 | Discharge: 2014-08-06 | Disposition: A | Discharge status: Home / Self Care | Payer: Self-pay | Source: Ambulatory Visit | Attending: Internal Medicine | Admitting: Internal Medicine

## 2014-08-07 ENCOUNTER — Telehealth: Payer: Self-pay | Admitting: Internal Medicine

## 2014-08-07 ENCOUNTER — Other Ambulatory Visit: Payer: Self-pay

## 2014-08-07 DIAGNOSIS — J439 Emphysema, unspecified: Secondary | ICD-10-CM

## 2014-08-07 MED ORDER — ALBUTEROL SULFATE (2.5 MG/3ML) 0.083% IN NEBU: INHALATION_SOLUTION | RESPIRATORY_TRACT | Status: DC

## 2014-08-07 NOTE — Telephone Encounter (Signed)
Spoke with Janelle from [facility], states that albuterol neb was sent in with no sig.  Needs to be refaxed with appropriate sig on rx to [phone] attn: Janelle.   This has been reprinted and placed on CY's desk to sign, and will be faxed as we receive it.  Nothing further needed.

## 2014-08-08 ENCOUNTER — Encounter (HOSPITAL_COMMUNITY): Payer: Self-pay

## 2014-08-13 ENCOUNTER — Encounter (HOSPITAL_COMMUNITY)
Admission: RE | Admit: 2014-08-13 | Discharge: 2014-08-13 | Disposition: A | Discharge status: Home / Self Care | Payer: Self-pay | Source: Ambulatory Visit | Attending: Internal Medicine | Admitting: Internal Medicine

## 2014-08-15 ENCOUNTER — Encounter (HOSPITAL_COMMUNITY)
Admission: RE | Admit: 2014-08-15 | Discharge: 2014-08-15 | Disposition: A | Discharge status: Home / Self Care | Payer: Self-pay | Source: Ambulatory Visit | Attending: Internal Medicine | Admitting: Internal Medicine

## 2014-08-20 ENCOUNTER — Encounter (HOSPITAL_COMMUNITY): Payer: Medicare Other

## 2014-08-20 DIAGNOSIS — R0602 Shortness of breath: Secondary | ICD-10-CM | POA: Insufficient documentation

## 2014-08-20 DIAGNOSIS — J449 Chronic obstructive pulmonary disease, unspecified: Secondary | ICD-10-CM | POA: Insufficient documentation

## 2014-08-20 DIAGNOSIS — Z5189 Encounter for other specified aftercare: Secondary | ICD-10-CM | POA: Insufficient documentation

## 2014-08-20 DIAGNOSIS — J984 Other disorders of lung: Secondary | ICD-10-CM | POA: Insufficient documentation

## 2014-08-22 ENCOUNTER — Encounter (HOSPITAL_COMMUNITY): Payer: Self-pay

## 2014-08-24 IMAGING — CR DG CHEST 2V
2 series · 2 of 2 positions shown · non-contrast
Comparison: 01/11/2013

CLINICAL DATA: COPD.  Prior smoker.

CHEST - 2 VIEW

[view not recorded (1 of 2)]
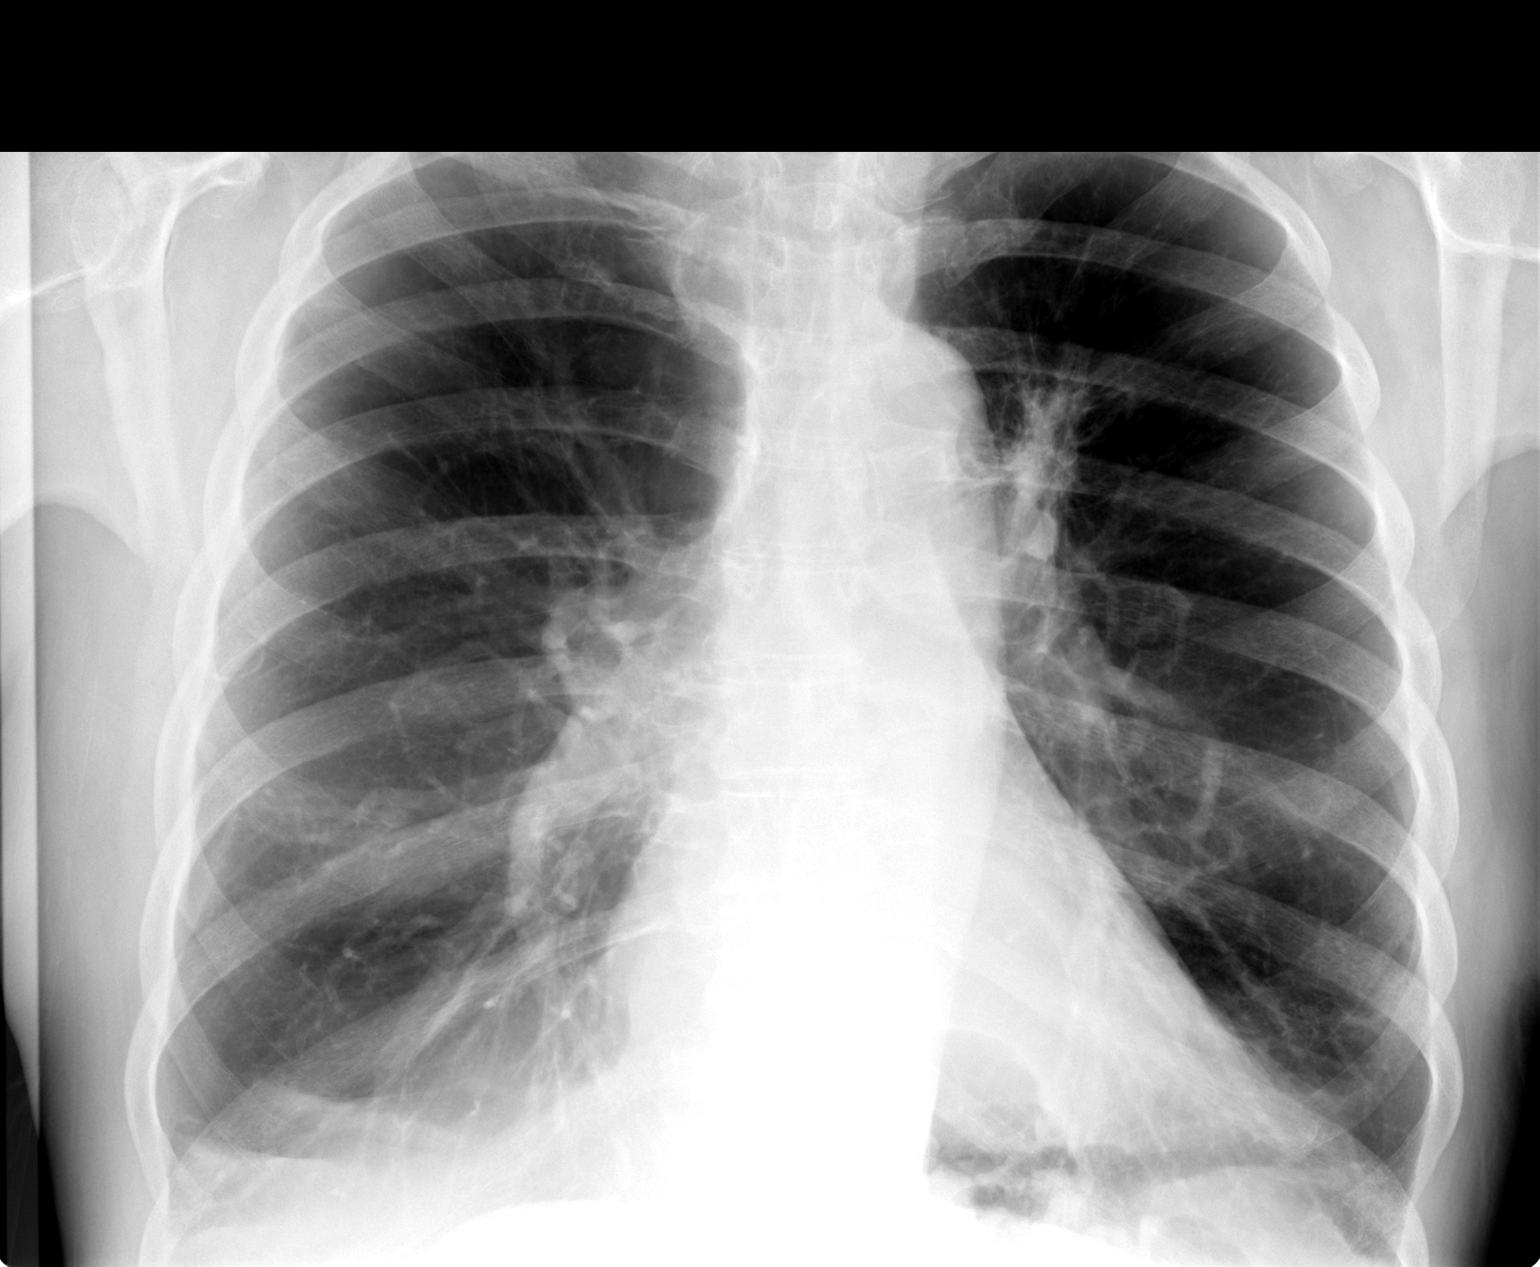

[view not recorded (2 of 2)]
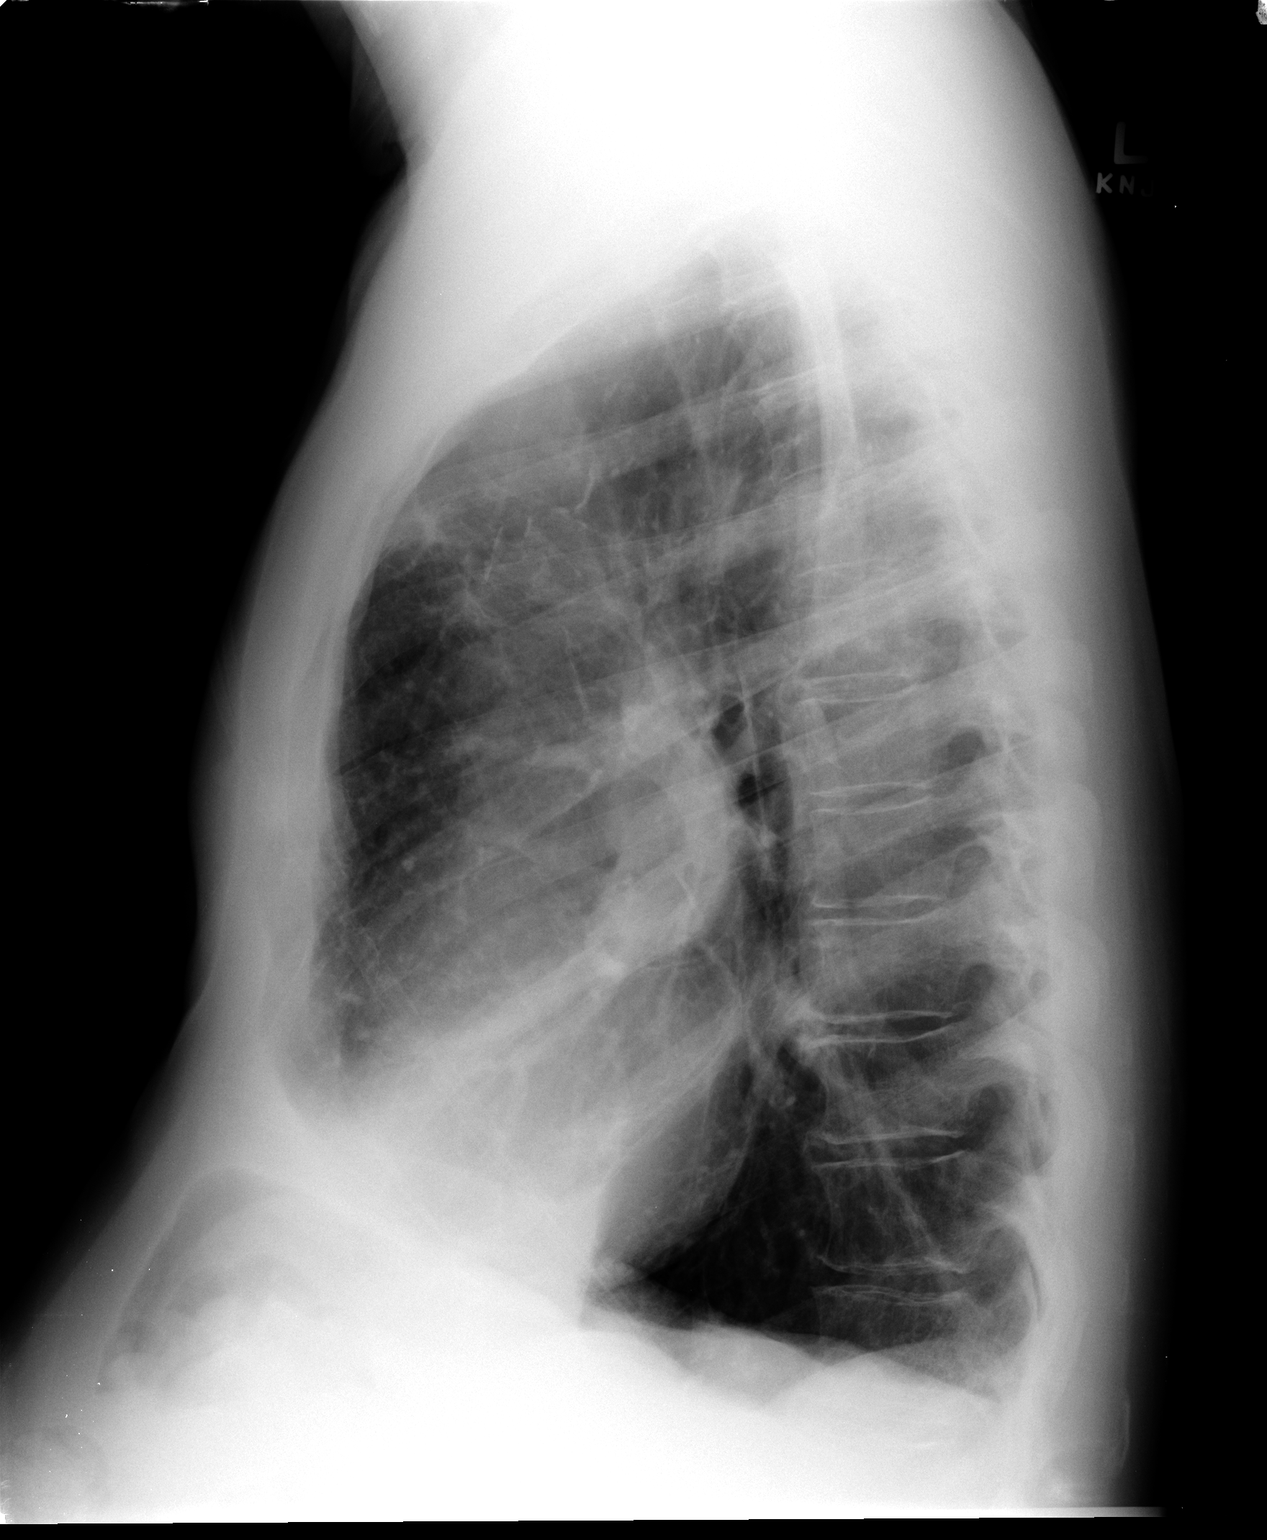

[2 of 2 positions shown; findings below may reference images not displayed]

FINDINGS: There is hyperinflation of the lungs compatible with
COPD.  Heart is normal size.  Mediastinal contours within normal
limits.  Previously seen left basilar airspace disease has
resolved.  Near complete resolution of the left upper lobe
consolidation.  Right lung is clear. No effusions.  No acute bony
abnormality.
IMPRESSION: Resolution of the left basilar opacity.  Near complete resolution
of the left suprahilar consolidation.

COPD.

## 2014-08-27 ENCOUNTER — Encounter (HOSPITAL_COMMUNITY): Payer: Medicare Other

## 2014-08-29 ENCOUNTER — Encounter (HOSPITAL_COMMUNITY): Payer: Self-pay

## 2014-09-03 ENCOUNTER — Encounter (HOSPITAL_COMMUNITY): Payer: Self-pay

## 2014-09-05 ENCOUNTER — Encounter (HOSPITAL_COMMUNITY): Payer: Self-pay

## 2014-09-10 ENCOUNTER — Encounter (HOSPITAL_COMMUNITY)
Admission: RE | Admit: 2014-09-10 | Discharge: 2014-09-10 | Disposition: A | Discharge status: Home / Self Care | Payer: Self-pay | Source: Ambulatory Visit | Attending: Internal Medicine | Admitting: Internal Medicine

## 2014-09-12 ENCOUNTER — Encounter (HOSPITAL_COMMUNITY): Payer: Self-pay

## 2014-09-17 ENCOUNTER — Encounter (HOSPITAL_COMMUNITY)
Admission: RE | Admit: 2014-09-17 | Discharge: 2014-09-17 | Disposition: A | Discharge status: Home / Self Care | Payer: Self-pay | Source: Ambulatory Visit | Attending: Internal Medicine | Admitting: Internal Medicine

## 2014-09-17 DIAGNOSIS — R0602 Shortness of breath: Secondary | ICD-10-CM | POA: Insufficient documentation

## 2014-09-17 DIAGNOSIS — J984 Other disorders of lung: Secondary | ICD-10-CM | POA: Insufficient documentation

## 2014-09-17 DIAGNOSIS — J449 Chronic obstructive pulmonary disease, unspecified: Secondary | ICD-10-CM | POA: Insufficient documentation

## 2014-09-17 DIAGNOSIS — Z5189 Encounter for other specified aftercare: Secondary | ICD-10-CM | POA: Insufficient documentation

## 2014-09-19 ENCOUNTER — Encounter (HOSPITAL_COMMUNITY): Payer: Self-pay

## 2014-09-24 ENCOUNTER — Encounter (HOSPITAL_COMMUNITY)
Admission: RE | Admit: 2014-09-24 | Discharge: 2014-09-24 | Disposition: A | Discharge status: Home / Self Care | Payer: Self-pay | Source: Ambulatory Visit | Attending: Internal Medicine | Admitting: Internal Medicine

## 2014-09-26 ENCOUNTER — Encounter (HOSPITAL_COMMUNITY)
Admission: RE | Admit: 2014-09-26 | Discharge: 2014-09-26 | Disposition: A | Discharge status: Home / Self Care | Payer: Self-pay | Source: Ambulatory Visit | Attending: Internal Medicine | Admitting: Internal Medicine

## 2014-10-01 ENCOUNTER — Encounter (HOSPITAL_COMMUNITY): Payer: Self-pay

## 2014-10-03 ENCOUNTER — Encounter (HOSPITAL_COMMUNITY)
Admission: RE | Admit: 2014-10-03 | Discharge: 2014-10-03 | Disposition: A | Discharge status: Home / Self Care | Payer: Self-pay | Source: Ambulatory Visit | Attending: Internal Medicine | Admitting: Internal Medicine

## 2014-10-08 ENCOUNTER — Encounter (HOSPITAL_COMMUNITY)
Admission: RE | Admit: 2014-10-08 | Discharge: 2014-10-08 | Disposition: A | Discharge status: Home / Self Care | Payer: Self-pay | Source: Ambulatory Visit | Attending: Internal Medicine | Admitting: Internal Medicine

## 2014-10-10 ENCOUNTER — Encounter (HOSPITAL_COMMUNITY)
Admission: RE | Admit: 2014-10-10 | Discharge: 2014-10-10 | Disposition: A | Discharge status: Home / Self Care | Payer: Self-pay | Source: Ambulatory Visit | Attending: Internal Medicine | Admitting: Internal Medicine

## 2014-10-15 ENCOUNTER — Encounter (HOSPITAL_COMMUNITY): Payer: Medicare Other

## 2014-10-15 DIAGNOSIS — Z5189 Encounter for other specified aftercare: Secondary | ICD-10-CM | POA: Insufficient documentation

## 2014-10-15 DIAGNOSIS — R0602 Shortness of breath: Secondary | ICD-10-CM | POA: Insufficient documentation

## 2014-10-15 DIAGNOSIS — J449 Chronic obstructive pulmonary disease, unspecified: Secondary | ICD-10-CM | POA: Insufficient documentation

## 2014-10-15 DIAGNOSIS — J984 Other disorders of lung: Secondary | ICD-10-CM | POA: Insufficient documentation

## 2014-10-17 ENCOUNTER — Encounter (HOSPITAL_COMMUNITY)
Admission: RE | Admit: 2014-10-17 | Discharge: 2014-10-17 | Disposition: A | Discharge status: Home / Self Care | Payer: Self-pay | Source: Ambulatory Visit | Attending: Internal Medicine | Admitting: Internal Medicine

## 2014-10-18 DIAGNOSIS — I471 Supraventricular tachycardia: Secondary | ICD-10-CM | POA: Diagnosis not present

## 2014-10-18 DIAGNOSIS — I10 Essential (primary) hypertension: Secondary | ICD-10-CM | POA: Diagnosis not present

## 2014-10-22 ENCOUNTER — Encounter (HOSPITAL_COMMUNITY)
Admission: RE | Admit: 2014-10-22 | Discharge: 2014-10-22 | Disposition: A | Discharge status: Home / Self Care | Payer: Self-pay | Source: Ambulatory Visit | Attending: Internal Medicine | Admitting: Internal Medicine

## 2014-10-24 ENCOUNTER — Encounter (HOSPITAL_COMMUNITY)
Admission: RE | Admit: 2014-10-24 | Discharge: 2014-10-24 | Disposition: A | Discharge status: Home / Self Care | Payer: Self-pay | Source: Ambulatory Visit | Attending: Internal Medicine | Admitting: Internal Medicine

## 2014-10-24 DIAGNOSIS — M79671 Pain in right foot: Secondary | ICD-10-CM | POA: Diagnosis not present

## 2014-10-24 DIAGNOSIS — E780 Pure hypercholesterolemia: Secondary | ICD-10-CM | POA: Diagnosis not present

## 2014-10-24 DIAGNOSIS — I10 Essential (primary) hypertension: Secondary | ICD-10-CM | POA: Diagnosis not present

## 2014-10-24 DIAGNOSIS — J439 Emphysema, unspecified: Secondary | ICD-10-CM | POA: Diagnosis not present

## 2014-10-24 DIAGNOSIS — I471 Supraventricular tachycardia: Secondary | ICD-10-CM | POA: Diagnosis not present

## 2014-10-29 ENCOUNTER — Encounter (HOSPITAL_COMMUNITY): Payer: Self-pay

## 2014-10-31 ENCOUNTER — Encounter (HOSPITAL_COMMUNITY): Payer: Self-pay

## 2014-11-05 ENCOUNTER — Encounter (HOSPITAL_COMMUNITY)
Admission: RE | Admit: 2014-11-05 | Discharge: 2014-11-05 | Disposition: A | Discharge status: Home / Self Care | Payer: Self-pay | Source: Ambulatory Visit | Attending: Internal Medicine | Admitting: Internal Medicine

## 2014-11-07 ENCOUNTER — Encounter (HOSPITAL_COMMUNITY)
Admission: RE | Admit: 2014-11-07 | Discharge: 2014-11-07 | Disposition: A | Discharge status: Home / Self Care | Payer: Self-pay | Source: Ambulatory Visit | Attending: Internal Medicine | Admitting: Internal Medicine

## 2014-11-12 ENCOUNTER — Encounter (HOSPITAL_COMMUNITY): Payer: Self-pay

## 2014-11-14 ENCOUNTER — Encounter (HOSPITAL_COMMUNITY)
Admission: RE | Admit: 2014-11-14 | Discharge: 2014-11-14 | Disposition: A | Discharge status: Home / Self Care | Payer: Self-pay | Source: Ambulatory Visit | Attending: Internal Medicine | Admitting: Internal Medicine

## 2014-11-19 ENCOUNTER — Encounter (HOSPITAL_COMMUNITY)
Admission: RE | Admit: 2014-11-19 | Discharge: 2014-11-19 | Disposition: A | Discharge status: Home / Self Care | Payer: Self-pay | Source: Ambulatory Visit | Attending: Internal Medicine | Admitting: Internal Medicine

## 2014-11-19 DIAGNOSIS — J984 Other disorders of lung: Secondary | ICD-10-CM | POA: Insufficient documentation

## 2014-11-19 DIAGNOSIS — Z5189 Encounter for other specified aftercare: Secondary | ICD-10-CM | POA: Insufficient documentation

## 2014-11-19 DIAGNOSIS — J449 Chronic obstructive pulmonary disease, unspecified: Secondary | ICD-10-CM | POA: Insufficient documentation

## 2014-11-19 DIAGNOSIS — R0602 Shortness of breath: Secondary | ICD-10-CM | POA: Insufficient documentation

## 2014-11-21 ENCOUNTER — Encounter (HOSPITAL_COMMUNITY)
Admission: RE | Admit: 2014-11-21 | Discharge: 2014-11-21 | Disposition: A | Discharge status: Home / Self Care | Payer: Self-pay | Source: Ambulatory Visit | Attending: Internal Medicine | Admitting: Internal Medicine

## 2014-11-26 ENCOUNTER — Encounter (HOSPITAL_COMMUNITY)
Admission: RE | Admit: 2014-11-26 | Discharge: 2014-11-26 | Disposition: A | Discharge status: Home / Self Care | Payer: Self-pay | Source: Ambulatory Visit | Attending: Internal Medicine | Admitting: Internal Medicine

## 2014-11-28 ENCOUNTER — Encounter (HOSPITAL_COMMUNITY)
Admission: RE | Admit: 2014-11-28 | Discharge: 2014-11-28 | Disposition: A | Discharge status: Home / Self Care | Payer: Self-pay | Source: Ambulatory Visit | Attending: Internal Medicine | Admitting: Internal Medicine

## 2014-12-02 ENCOUNTER — Telehealth: Payer: Self-pay | Admitting: Internal Medicine

## 2014-12-02 NOTE — Telephone Encounter (Signed)
lmtcb X1 for pt  

## 2014-12-02 NOTE — Telephone Encounter (Signed)
Pt returned call - [phone]

## 2014-12-02 NOTE — Telephone Encounter (Signed)
Pt c/o cough and congestion with pale yellow mucus production x  4 days.  Pt states that he started taking ZPAK on Friday 11/29/14 that he had on hand at home. Pt states that he also has a 8-day pred taper at home as well that he would like to know if he needs to start taking.  Denies fever.  Please advise Dr Annamaria Boots if pt needs OV or if he can start Pred. Thanks.  Allergies  Allergen Reactions  . Tolectin [Tolmetin Sodium] Other (See Comments)    BP low and passed out  . Montelukast Sodium      Medication List       This list is accurate as of: 12/02/14  9:01 AM.  Always use your most recent med list.               AEROCHAMBER MV inhaler  Use as instructed     albuterol 108 (90 BASE) MCG/ACT inhaler  Commonly known as:  PROAIR HFA  Inhale 2 puffs into the lungs 4 (four) times daily as needed for wheezing or shortness of breath.     albuterol (2.5 MG/3ML) 0.083% nebulizer solution  Commonly known as:  PROVENTIL  Use 1 vial in nebulizer every 4-6 hours as needed.  Dx j43.9     ALPRAZolam 0.5 MG tablet  Commonly known as:  XANAX  Take 0.5 mg by mouth as needed.     amLODipine 5 MG tablet  Commonly known as:  NORVASC  Take 2.5 mg by mouth daily.     aspirin 81 MG tablet  Take 81 mg by mouth daily.     budesonide-formoterol 160-4.5 MCG/ACT inhaler  Commonly known as:  SYMBICORT  Inhale 2 puffs into the lungs 2 (two) times daily.     digoxin 0.25 MG tablet  Commonly known as:  LANOXIN  Take 250 mcg by mouth daily.     losartan 50 MG tablet  Commonly known as:  COZAAR  Take 1 tablet by mouth Daily.     pravastatin 40 MG tablet  Commonly known as:  PRAVACHOL  Take 1 tablet by mouth daily.     promethazine-codeine 6.25-10 MG/5ML syrup  Commonly known as:  PHENERGAN with CODEINE  Take 5 mLs by mouth every 6 (six) hours as needed for cough.     tiotropium 18 MCG inhalation capsule  Commonly known as:  SPIRIVA  Place 1 capsule (18 mcg total) into inhaler and inhale  daily.     Tiotropium Bromide Monohydrate 2.5 MCG/ACT Aers  Commonly known as:  SPIRIVA RESPIMAT  2 puffs once daily

## 2014-12-02 NOTE — Telephone Encounter (Signed)
Pt aware of CY's recs.  ntohing further needed.

## 2014-12-02 NOTE — Telephone Encounter (Signed)
Ok for him to start prednisone taper if he feels he needs it. Agree with Z pak. I don't think he needs an ov at this point. Let us know if it doesn't clear up.

## 2014-12-03 ENCOUNTER — Encounter (HOSPITAL_COMMUNITY): Payer: Self-pay

## 2014-12-04 DIAGNOSIS — G608 Other hereditary and idiopathic neuropathies: Secondary | ICD-10-CM | POA: Diagnosis not present

## 2014-12-05 ENCOUNTER — Ambulatory Visit (INDEPENDENT_AMBULATORY_CARE_PROVIDER_SITE_OTHER)
Admission: RE | Admit: 2014-12-05 | Discharge: 2014-12-05 | Disposition: A | Discharge status: Home / Self Care | Payer: Medicare Other | Source: Ambulatory Visit | Attending: Internal Medicine | Admitting: Internal Medicine

## 2014-12-05 ENCOUNTER — Ambulatory Visit: Payer: Medicare Other | Admitting: Internal Medicine

## 2014-12-05 ENCOUNTER — Encounter (HOSPITAL_COMMUNITY): Payer: Self-pay

## 2014-12-05 ENCOUNTER — Encounter: Payer: Self-pay | Admitting: Internal Medicine

## 2014-12-05 VITALS — BP 118/64 | HR 80 | Ht 69.0 in | Wt 171.8 lb

## 2014-12-05 DIAGNOSIS — J441 Chronic obstructive pulmonary disease with (acute) exacerbation: Secondary | ICD-10-CM

## 2014-12-05 DIAGNOSIS — J449 Chronic obstructive pulmonary disease, unspecified: Secondary | ICD-10-CM | POA: Diagnosis not present

## 2014-12-05 DIAGNOSIS — R05 Cough: Secondary | ICD-10-CM | POA: Diagnosis not present

## 2014-12-05 MED ORDER — BUDESONIDE 180 MCG/ACT IN AEPB: INHALATION_SPRAY | RESPIRATORY_TRACT | Status: DC

## 2014-12-05 MED ORDER — METHYLPREDNISOLONE ACETATE 80 MG/ML IJ SUSP
80.0000 mg | Freq: Once | INTRAMUSCULAR | Status: AC
  Administered 2014-12-05: 80 mg via INTRAMUSCULAR

## 2014-12-05 MED ORDER — TIOTROPIUM BROMIDE-OLODATEROL 2.5-2.5 MCG/ACT IN AERS: 2.0000 | INHALATION_SPRAY | Freq: Every day | RESPIRATORY_TRACT | Status: DC

## 2014-12-05 MED ORDER — LEVALBUTEROL HCL 0.63 MG/3ML IN NEBU
0.6300 mg | INHALATION_SOLUTION | Freq: Once | RESPIRATORY_TRACT | Status: AC
  Administered 2014-12-05: 0.63 mg via RESPIRATORY_TRACT

## 2014-12-05 MED ORDER — BUDESONIDE 180 MCG/ACT IN AEPB: 2.0000 | INHALATION_SPRAY | Freq: Two times a day (BID) | RESPIRATORY_TRACT | Status: DC

## 2014-12-05 NOTE — Patient Instructions (Signed)
Sample and script Stioto Respimat 2 puffs, once daily  Try this instead of Spiriva and Symbicort  Sample of Budesonide/ Pulmicort Flexhaler   2 puffs then rinse mouth, twice daily  Try a little Ensure after each meal, to help with weight loss  Order- CXR  Dx acute exacerbation of COPD  Neb xop 0.63  Depo 80

## 2014-12-05 NOTE — Progress Notes (Signed)
Patient ID: [name], male    DOB: 06-13-1935, 80 y.o.   MRN: [id number] PCP Dr Hulan Fess HPI 01/25/11- 24 yo M former smoker followed for COPD/ emphysema with recent concern for pulmonary nodules. Last here May 20, 2010- note reviewed. CT done during the winter showed nodules.  PET positive nodule Left lung faded before needle bx. He had gone to [facility] for second opinion w/ Dr [name] where severity of his advanced lung disease was emphasized.  lung volume reduction and transplant were mentioned although I don't know how seriously, given his age.[name] He comes today to update and to have conference including his wife:  Conference on overall status: He had concern about amount of radiation from imaging and I reminded him that is why we chose to do CXR 05/20/10 which showed stable COPD. PFT 07/01/05 - FEV1 1.13/ 0.39; FEV1/FVC 0.25; DLCO 0.52; insignif response to bronchodilator. PFT at [facility] 11/26/10- FEV1 0.82/ 0.25; FEV1/FVC 0.22 Quality of life and prognosis discussed. He has noted some decline over past year. He could walk through [employer] without O2 then and no longer can. Emphysema pattern with little cough, wheeze, phlegm. No chest pain, palpitation or ankle edema. After long discussion we agreed to an inquiry to [facility] Transplant coordinator for consideration. I told him his age made acceptance unlikely. We discussed limited role of meds,He has had Pneumovax, annual flu vax, Pulmonary Rehab, and test normal for a1AT in the past. Has O2 2l/m for sleep and exertion, home nebulizer. He asks another script for prednisone to hold and we discussed side effects.   05/20/11 80 yo M former smoker followed for COPD/ emphysema with recent concern for pulmonary nodules. .. wife here He and his wife come today for an extended review of his early experience with [facility] lung transplant team evaluation. PFT- [facility] 04/27/11- FEV1 0.78/23%, FEV1/FVC 0.27, TLC 98%, RV 163%, DLCO 37%. ABG-room air-04/27/2011  pH 7.42, PCO2 40, PO2 63, bicarbonate 26, O2 saturation 91.9% 6 minute walk test 04/27/2011: 314.6 m (60% to distance predicted) with significant dyspnea while wearing oxygen at 3 L. He came away with significant reservations about the prospects for transplant. He asked for my estimate of his life expectancy without transplant. I suggested that barring an intervening acute illness, an additional 5 years is possible. He understands this is a very loose estimate. I deferred questions about hepatitis B vaccination and testosterone replacement to his primary physician. He wanted to know what else we might do to test and treat him if he chose not to seek transplant. We will watch his lung nodules with chest x-ray and/or CT, but our ability to intervene would be limited. If he does not have transplant, I don't know that cardiac stress test or repeat pulmonary function tests would add much useful  information unless symptoms change.  10/20/11- 26 yo M former smoker followed for COPD/ emphysema with recent concern for pulmonary nodules [facility] CXR 04/27/11- COPD with no mention of nodules. Followup at [facility] pulmonary has been deferred until September. They want stress test. He is leaning away from transplant at this time of blood and he wanted my thoughts. Discussed his left upper lobe nodule, atherosclerosis on CT scan in April of 2012. He continues pulmonary rehabilitation. Oxygen saturation on 3 L is 96% during exercise. He feels his current life quality is okay. Denies chest pain, acute discomfort, edema. Little cough or phlegm.  05/04/12-  41 yo M former smoker followed for COPD/ emphysema with  recent concern for pulmonary nodules [facility] CXR 04/27/11- COPD with no mention of nodules. Sob same, cough yellow to clear, no wheezing, no fcs He talked about another meeting with the [facility] transplant doctors. At this point he is less interested in lung transplant as an option. Echocardiogram 12/16/2011 showed EF 65-70%, no  pulmonary hypertension, grade 1 diastolic dysfunction. [name] increased nocturia. Oxygen is used with sleep and exertion  11/02/12- 71 yo M former smoker followed for COPD/ emphysema with recent concern for pulmonary nodules FOLLOWS FOR: states breathing is about the same since last visit; would like [name] inhaler sample ( his current insurance will cover); also needs refill for [facility] HFA. Stable through the winter with no acute infection. Remains on oxygen now, especially with exertion. Has used Spiriva but would like to try [place] again. a1AT normal MM. CT 05/19/12- images reviewed again with him IMPRESSION:  1. Left upper lobe nodule is stable from 12/11/2010. Additional  follow-up in one year is recommended to document 2 years of  stability. This recommendation follows the consensus statement:  Guidelines for Management of Small Pulmonary Nodules Detected on CT  Scans: A Statement from the [place] as published in  Radiology 2005; 237:395-400.  2. Additional scattered pulmonary nodules are stable and therefore  considered benign.  3. Previously seen left upper lobe subpleural airspace  consolidation has resolved in the interval.  Original Report Authenticated By: [name], M.D.  12/15/12- 19 yo M former smoker followed for COPD/ emphysema with recent concern for pulmonary nodules FOLLOWS FOR: pt reports x 2wks had a scratchy throat, nonprod cough, fatigue -- statrted taking mucinex x3days seems to help some [name] at pulmonary rehabilitation sent him here. O2 3L/ [employer]  01/11/13 80 yo M former smoker followed for COPD/ emphysema with recent concern for pulmonary nodules   Wife here ACUTE VISIT: fevers, increased SOB, no energy, slight sore throat (not bad), cough-mostly dry.Pain in left side-unsure if from cough. At beach last week. Feels ill. May have gotten chills. Now fever x5 days, 101-102. Increased his oxygen to 4 L. Short of breath, no energy, mild sore  throat, dry cough, tussive left chest pain. No dysuria or nausea. Saw PCP on Tuesday, suspected viral infection.  06/05/13- 36 yo M former smoker followed for COPD/ emphysema, pulmonary nodules        FOLLOWS FOR:  Breathing is unchanged since last OV.  Discuss CT scan No signif  Change. Slow recovery from pneumonia- xrays reviewed.Little cough, no phlegm. Trying Breo.  Ankles swell some. No chest pain or palpitation. O2 2-3 L/ [employer] CT 05/17/13  Also notes atherosclerosis aorta IMPRESSION:  1. Stable uptake stress set left upper lobe nodule is stable in size  from previous exam. Interval scarring and architectural distortion  within this area is likely the sequelae of inflammation or  infection.  2. The other previously referenced nodules are unchanged from  previous exam and are most likely benign.  3. Emphysema.  Electronically Signed  By: Kerby Moors M.D.  On: 05/17/2013 12:04  11/12/13- 60 yo M former smoker followed for COPD/ emphysema, pulmonary nodules  ACUTE VISIT:  Fever and congestion with cough and brown mucus.  Also, has wheezing  wife here More short of breath over the weekend with stuffy nose. Temperature 100.1 last night. Started Z-Pak yesterday. Much cough productive brown. More short of breath, no energy, malaise.   12/13/13- 69 yo M former smoker followed for COPD/ emphysema, pulmonary nodules  FOLLOWS FOR:  Breathing  improved since last OV.  Still having sob with exertion Comfortable off of oxygen while sitting quietly but needs oxygen at 3 L/ [employer] for any exertion and for sleep. CBC w/ diff 11/12/13- WBC 10,600, hemoglobin 13.6, 85% neutrophils CXR 11/12/13 IMPRESSION:  No evidence of acute cardiopulmonary disease.  Electronically Signed  By: Julian Hy M.D.  On: 11/12/2013 14:00  06/13/14- 24 yo M former smoker followed for COPD/ emphysema, pulmonary nodules , complicated by CAD, hx lung nodules FOLLOWS FOR: patient states trouble with breathing  worsening last 3 days.  Patient has had wheezing; he denies chest tightness.  12/05/14- 29 yo M former smoker followed for COPD/ emphysema, pulmonary nodules , complicated by CAD, hx lung nodules ACUTE VISIT: Increased congestion; "got sick last week; thought is was the flu"-upset stomach. That cleared up and started having congestion; currently on Prednisone was told to see CY if no better. Has had Zpak and pred taper he had at home..      Review of Systems-see HPI Constitutional:   No-   weight loss, night sweats, +fevers, no-chills, +fatigue, lassitude. HEENT:   No-  headaches, difficulty swallowing, tooth/dental problems, +sore throat,       No-  sneezing, itching, ear ache, nasal congestion, post nasal drip,  CV:  No-chest pain, no-orthopnea, PND, No-swelling in lower extremities, anasarca, dizziness, palpitations Resp: + shortness of breath with exertion, not at rest.              No-productive cough,  + non-productive cough,  No-  coughing up of blood.             No-change in color of mucus.  No- wheezing.   Skin: No-   rash or lesions. GI:  No-   heartburn, indigestion, abdominal pain, nausea,   GU: No-   dysuria,  MS:  No-   joint pain or swelling.  Neuro-  Psych:  No- change in mood or affect. No acute depression or anxiety.  No memory loss.   OBJ- General- Alert, Oriented, Affect-appropriate, Distress- none acute. O2 4L.  Skin- rash-none, lesions- none, excoriation- none Lymphadenopathy- none Head- atraumatic            Eyes- Gross vision intact, PERRLA, conjunctivae clear secretions            Ears- Hearing, canals-normal            Nose- Clear, no-Septal dev, mucus, polyps, erosion, perforation             Throat- Mallampati II , mucosa clear , drainage- none, tonsils- atrophic.  Neck- flexible , trachea midline, no stridor , thyroid nl, carotid no bruit Chest - symmetrical excursion , unlabored           Heart/CV- RRR , no murmur , no gallop  , no rub, nl s1 s2                            - JVD- none , edema-none, stasis changes- none, varices- none           Lung-  very distant sounds/ clear, unlabored, cough+ deep , dullness-none, rub- none           Chest wall-  Abd-  Br/ Gen/ Rectal- Not done, not indicated Extrem- cyanosis- none, clubbing, none, atrophy- none, strength- nl.  Neuro- grossly intact to observation

## 2014-12-08 ENCOUNTER — Other Ambulatory Visit: Payer: Self-pay | Admitting: Internal Medicine

## 2014-12-09 NOTE — Telephone Encounter (Signed)
Ok to refill 

## 2014-12-09 NOTE — Telephone Encounter (Signed)
Last OV: 12-05-14; given Xopenex 0.63, Depo 80, and CXR. CY Please advise on refill. Thanks.

## 2014-12-10 ENCOUNTER — Encounter (HOSPITAL_COMMUNITY)
Admission: RE | Admit: 2014-12-10 | Discharge: 2014-12-10 | Disposition: A | Discharge status: Home / Self Care | Payer: Self-pay | Source: Ambulatory Visit | Attending: Internal Medicine | Admitting: Internal Medicine

## 2014-12-17 NOTE — Telephone Encounter (Signed)
Ok to fill. Over-ride

## 2014-12-17 NOTE — Telephone Encounter (Signed)
CY please advise as there are drug interaction messages popping up for patient-Digoxin and Zithromax. Thanks.

## 2014-12-19 ENCOUNTER — Encounter: Payer: Self-pay | Admitting: Internal Medicine

## 2014-12-19 ENCOUNTER — Ambulatory Visit (INDEPENDENT_AMBULATORY_CARE_PROVIDER_SITE_OTHER): Payer: Medicare Other | Admitting: Internal Medicine

## 2014-12-19 VITALS — BP 112/64 | HR 80 | Ht 69.0 in | Wt 171.4 lb

## 2014-12-19 DIAGNOSIS — J432 Centrilobular emphysema: Secondary | ICD-10-CM

## 2014-12-19 DIAGNOSIS — J9611 Chronic respiratory failure with hypoxia: Secondary | ICD-10-CM

## 2014-12-19 NOTE — Patient Instructions (Signed)
You can try the Stiolto and Pulmicort inhalers as an alternative to your current Symbicort and Spiriva, when you feel like it.  Please call if we can help

## 2014-12-19 NOTE — Progress Notes (Signed)
Patient ID: [name], male    DOB: Jan 04, 1935, 80 y.o.   MRN: [id number] PCP Dr [name] HPI 01/25/11- 62 yo M former smoker followed for COPD/ emphysema with recent concern for pulmonary nodules. Last here May 20, 2010- note reviewed. CT done during [name] winter showed nodules.  PET positive nodule Left lung faded before needle bx. He had gone to [name] for second opinion w/ Dr [name] where severity of his advanced lung disease was emphasized.  lung volume reduction and transplant were mentioned although I don't know how seriously, given his age.[name] He comes today to update and to have conference including his wife:  Conference on overall status: He had concern about amount of radiation from imaging and I reminded him that is why we chose to do CXR 05/20/10 which showed stable COPD. PFT 07/01/05 - FEV1 1.13/ 0.39; FEV1/FVC 0.25; DLCO 0.52; insignif response to bronchodilator. PFT at [name] 11/26/10- FEV1 0.82/ 0.25; FEV1/FVC 0.22 Quality of life and prognosis discussed. He has noted some decline over past year. He could walk through [name] without O2 then and no longer can. Emphysema pattern with little cough, wheeze, phlegm. No chest pain, palpitation or ankle edema. After long discussion we agreed to an inquiry to [name] Transplant coordinator for consideration. I told him his age made acceptance unlikely. We discussed limited role of meds,He has had Pneumovax, annual flu vax, Pulmonary Rehab, and test normal for a1AT in [name] past. Has O2 2l/m for sleep and exertion, home nebulizer. He asks another script for prednisone to hold and we discussed side effects.   05/20/11 80 yo M former smoker followed for COPD/ emphysema with recent concern for pulmonary nodules. .. wife here He and his wife come today for an extended review of his early experience with [name] evaluation. PFT- [name] 04/27/11- FEV1 0.78/23%, FEV1/FVC 0.27, TLC 98%, RV 163%, DLCO 37%. ABG-room air-04/27/2011  pH 7.42, PCO2 40, PO2 63, bicarbonate 26, O2 saturation 91.9% 6 minute walk test 04/27/2011: 314.6 m (60% to distance predicted) with significant dyspnea while wearing oxygen at 3 L. He came away with significant reservations about [name] prospects for transplant. He asked for my estimate of his life expectancy without transplant. I suggested that barring an intervening acute illness, an additional 5 years is possible. He understands this is a very loose estimate. I deferred questions about hepatitis B vaccination and testosterone replacement to his primary physician. He wanted to know what else we might do to test and treat him if he chose not to seek transplant. We will watch his lung nodules with chest x-ray and/or CT, but our ability to intervene would be limited. If he does not have transplant, I don't know that cardiac stress test or repeat pulmonary function tests would add much useful  information unless symptoms change.  10/20/11- 55 yo M former smoker followed for COPD/ emphysema with recent concern for pulmonary nodules [name] CXR 04/27/11- COPD with no mention of nodules. Followup at [name] pulmonary has been deferred until September. They want stress test. He is leaning away from transplant at this time of blood and he wanted my thoughts. Discussed his left upper lobe nodule, atherosclerosis on CT scan in April of 2012. He continues pulmonary rehabilitation. Oxygen saturation on 3 L is 96% during exercise. He feels his current life quality is okay. Denies chest pain, acute discomfort, edema. Little cough or phlegm.  05/04/12-  67 yo M former smoker followed for COPD/ emphysema with  recent concern for pulmonary nodules [name] CXR 04/27/11- COPD with no mention of nodules. Sob same, cough yellow to clear, no wheezing, no fcs He talked about another meeting with [name] [name]. At this point he is less interested in lung transplant as an option. Echocardiogram 12/16/2011 showed EF 65-70%, no  pulmonary hypertension, grade 1 diastolic dysfunction. [name] increased nocturia. Oxygen is used with sleep and exertion  11/02/12- 35 yo M former smoker followed for COPD/ emphysema with recent concern for pulmonary nodules FOLLOWS FOR: states breathing is about [name] same since last visit; would like [name] inhaler sample ( his current insurance will cover); also needs refill for [name] HFA. Stable through [name] winter with no acute infection. Remains on oxygen now, especially with exertion. Has used Spiriva but would like to try [place] again. a1AT normal MM. CT 05/19/12- images reviewed again with him IMPRESSION:  1. Left upper lobe nodule is stable from 12/11/2010. Additional  follow-up in one year is recommended to document 2 years of  stability. This recommendation follows [name] consensus statement:  Guidelines for Management of Small Pulmonary Nodules Detected on CT  Scans: A Statement from [name] [name] as published in  Radiology 2005; 237:395-400.  2. Additional scattered pulmonary nodules are stable and therefore  considered benign.  3. Previously seen left upper lobe subpleural airspace  consolidation has resolved in [name] interval.  Original Report Authenticated By: [name], M.D.  12/15/12- 28 yo M former smoker followed for COPD/ emphysema with recent concern for pulmonary nodules FOLLOWS FOR: pt reports x 2wks had a scratchy throat, nonprod cough, fatigue -- statrted taking mucinex x3days seems to help some [name] at pulmonary rehabilitation sent him here. O2 3L/ [name]  01/11/13 80 yo M former smoker followed for COPD/ emphysema with recent concern for pulmonary nodules   Wife here ACUTE VISIT: fevers, increased SOB, no energy, slight sore throat (not bad), cough-mostly dry.Pain in left side-unsure if from cough. At beach last week. Feels ill. May have gotten chills. Now fever x5 days, 101-102. Increased his oxygen to 4 L. Short of breath, no energy, mild sore  throat, dry cough, tussive left chest pain. No dysuria or nausea. Saw PCP on Tuesday, suspected viral infection.  06/05/13- 17 yo M former smoker followed for COPD/ emphysema, pulmonary nodules        FOLLOWS FOR:  Breathing is unchanged since last OV.  Discuss CT scan No signif  Change. Slow recovery from pneumonia- xrays reviewed.Little cough, no phlegm. Trying Breo.  Ankles swell some. No chest pain or palpitation. O2 2-3 L/ [name] CT 05/17/13  Also notes atherosclerosis aorta IMPRESSION:  1. Stable uptake stress set left upper lobe nodule is stable in size  from previous exam. Interval scarring and architectural distortion  within this area is likely [name] sequelae of inflammation or  infection.  2. [name] other previously referenced nodules are unchanged from  previous exam and are most likely benign.  3. Emphysema.  Electronically Signed  By: Kerby Moors M.D.  On: 05/17/2013 12:04  11/12/13- 80 yo M former smoker followed for COPD/ emphysema, pulmonary nodules  ACUTE VISIT:  Fever and congestion with cough and brown mucus.  Also, has wheezing  wife here More short of breath over [name] weekend with stuffy nose. Temperature 100.1 last night. Started Z-Pak yesterday. Much cough productive brown. More short of breath, no energy, malaise.   12/13/13- 32 yo M former smoker followed for COPD/ emphysema, pulmonary nodules  FOLLOWS FOR:  Breathing  improved since last OV.  Still having sob with exertion Comfortable off of oxygen while sitting quietly but needs oxygen at 3 L/ [name] for any exertion and for sleep. CBC w/ diff 11/12/13- WBC 10,600, hemoglobin 13.6, 85% neutrophils CXR 11/12/13 IMPRESSION:  No evidence of acute cardiopulmonary disease.  Electronically Signed  By: Julian Hy M.D.  On: 11/12/2013 14:00  06/13/14- 55 yo M former smoker followed for COPD/ emphysema, pulmonary nodules , complicated by CAD, hx lung nodules FOLLOWS FOR: patient states trouble with breathing  worsening last 3 days.  Patient has had wheezing; he denies chest tightness.  12/05/14- 38 yo M former smoker followed for COPD/ emphysema, pulmonary nodules , complicated by CAD, hx lung nodules ACUTE VISIT: Increased congestion; "got sick last week; thought it was [name] flu"-upset stomach. That cleared up and started having congestion; currently on Prednisone was told to see CY if no better. Has had Zpak and pred taper he had at home..  12/19/14- 58 yo M former smoker followed for COPD/ emphysema, pulmonary nodules , complicated by CAD, hx lung nodules FOLLOWS LKT:GYBWLSL better, sob better,fell at home 4-5 days ago-bruised on oxygen tubing,sore in back,no cough,no wheeze. We gave Stiolto/ Pulmicort- he has not tried yet. O2 3-5 liters/[name] Tripped/ fell hurting back- PCP treating, affects sleep. CXR 12/05/14 IMPRESSION: COPD without acute abnormality. Electronically Signed  By: Inez Catalina M.D.  On: 12/05/2014 10:41    Review of Systems-see HPI Constitutional:   No-   weight loss, night sweats, +fevers, no-chills, +fatigue, lassitude. HEENT:   No-  headaches, difficulty swallowing, tooth/dental problems, +sore throat,       No-  sneezing, itching, ear ache, nasal congestion, post nasal drip,  CV:  No-chest pain, no-orthopnea, PND, No-swelling in lower extremities, anasarca, dizziness, palpitations Resp: + shortness of breath with exertion, not at rest.              No-productive cough,  + non-productive cough,  No-  coughing up of blood.             No-change in color of mucus.  No- wheezing.   Skin: No-   rash or lesions. GI:  No-   heartburn, indigestion, abdominal pain, nausea,   GU: No-   dysuria,  MS:  No-   joint pain or swelling.  Neuro-  Psych:  No- change in mood or affect. No acute depression or anxiety.  No memory loss.  OBJ- General- Alert, Oriented, Affect-appropriate, Distress- none acute. O2 4L.  Skin- rash-none, lesions- none, excoriation- none Lymphadenopathy-  none Head- atraumatic            Eyes- Gross vision intact, PERRLA, conjunctivae clear secretions            Ears- Hearing, canals-normal            Nose- Clear, no-Septal dev, mucus, polyps, erosion, perforation             Throat- Mallampati II , mucosa clear , drainage- none, tonsils- atrophic.  Neck- flexible , trachea midline, no stridor , thyroid nl, carotid no bruit Chest - symmetrical excursion , unlabored           Heart/CV- RRR , no murmur , no gallop  , no rub, nl s1 s2                           - JVD- none , edema-none, stasis changes- none, varices- none  Lung-  + very distant sounds/ clear, unlabored, cough+-none , dullness-none, rub- none           Chest wall-  Abd-  Br/ Gen/ Rectal- Not done, not indicated Extrem- cyanosis- none, clubbing, none, atrophy- none, strength- nl.  Neuro- grossly intact to observation

## 2014-12-24 DIAGNOSIS — N400 Enlarged prostate without lower urinary tract symptoms: Secondary | ICD-10-CM | POA: Diagnosis not present

## 2014-12-26 ENCOUNTER — Encounter (HOSPITAL_COMMUNITY): Payer: Self-pay

## 2014-12-26 DIAGNOSIS — J449 Chronic obstructive pulmonary disease, unspecified: Secondary | ICD-10-CM | POA: Insufficient documentation

## 2014-12-26 DIAGNOSIS — R0602 Shortness of breath: Secondary | ICD-10-CM | POA: Insufficient documentation

## 2014-12-26 DIAGNOSIS — Z5189 Encounter for other specified aftercare: Secondary | ICD-10-CM | POA: Insufficient documentation

## 2014-12-26 DIAGNOSIS — J984 Other disorders of lung: Secondary | ICD-10-CM | POA: Insufficient documentation

## 2014-12-31 ENCOUNTER — Encounter (HOSPITAL_COMMUNITY): Payer: Self-pay

## 2015-01-02 ENCOUNTER — Encounter (HOSPITAL_COMMUNITY): Payer: Self-pay

## 2015-01-07 ENCOUNTER — Encounter (HOSPITAL_COMMUNITY)
Admission: RE | Admit: 2015-01-07 | Discharge: 2015-01-07 | Disposition: A | Discharge status: Home / Self Care | Payer: Self-pay | Source: Ambulatory Visit | Attending: Internal Medicine | Admitting: Internal Medicine

## 2015-01-08 DIAGNOSIS — R972 Elevated prostate specific antigen [PSA]: Secondary | ICD-10-CM | POA: Diagnosis not present

## 2015-01-08 DIAGNOSIS — N5201 Erectile dysfunction due to arterial insufficiency: Secondary | ICD-10-CM | POA: Diagnosis not present

## 2015-01-08 DIAGNOSIS — G608 Other hereditary and idiopathic neuropathies: Secondary | ICD-10-CM | POA: Diagnosis not present

## 2015-01-08 DIAGNOSIS — N400 Enlarged prostate without lower urinary tract symptoms: Secondary | ICD-10-CM | POA: Diagnosis not present

## 2015-01-09 ENCOUNTER — Encounter (HOSPITAL_COMMUNITY): Payer: Self-pay

## 2015-01-12 DIAGNOSIS — J9611 Chronic respiratory failure with hypoxia: Secondary | ICD-10-CM | POA: Insufficient documentation

## 2015-01-12 NOTE — Assessment & Plan Note (Signed)
He appropriately adjust oxygen based on need. Slowly drifting towards a need for higher flow rates. We will watch impact of summer season.

## 2015-01-12 NOTE — Assessment & Plan Note (Signed)
He resolved an acute exacerbation with bronchitis at last visit and is probably now at baseline. Summer management issues discussed.

## 2015-01-14 ENCOUNTER — Encounter (HOSPITAL_COMMUNITY)
Admission: RE | Admit: 2015-01-14 | Discharge: 2015-01-14 | Disposition: A | Discharge status: Home / Self Care | Payer: Self-pay | Source: Ambulatory Visit | Attending: Internal Medicine | Admitting: Internal Medicine

## 2015-01-16 ENCOUNTER — Encounter (HOSPITAL_COMMUNITY)
Admission: RE | Admit: 2015-01-16 | Discharge: 2015-01-16 | Disposition: A | Discharge status: Home / Self Care | Payer: Self-pay | Source: Ambulatory Visit | Attending: Internal Medicine | Admitting: Internal Medicine

## 2015-01-16 DIAGNOSIS — J984 Other disorders of lung: Secondary | ICD-10-CM | POA: Insufficient documentation

## 2015-01-16 DIAGNOSIS — J449 Chronic obstructive pulmonary disease, unspecified: Secondary | ICD-10-CM | POA: Insufficient documentation

## 2015-01-16 DIAGNOSIS — R0602 Shortness of breath: Secondary | ICD-10-CM | POA: Insufficient documentation

## 2015-01-16 DIAGNOSIS — Z5189 Encounter for other specified aftercare: Secondary | ICD-10-CM | POA: Insufficient documentation

## 2015-01-21 ENCOUNTER — Encounter (HOSPITAL_COMMUNITY)
Admission: RE | Admit: 2015-01-21 | Discharge: 2015-01-21 | Disposition: A | Discharge status: Home / Self Care | Payer: Self-pay | Source: Ambulatory Visit | Attending: Internal Medicine | Admitting: Internal Medicine

## 2015-01-21 DIAGNOSIS — H18413 Arcus senilis, bilateral: Secondary | ICD-10-CM | POA: Diagnosis not present

## 2015-01-21 DIAGNOSIS — Z961 Presence of intraocular lens: Secondary | ICD-10-CM | POA: Diagnosis not present

## 2015-01-21 DIAGNOSIS — H18411 Arcus senilis, right eye: Secondary | ICD-10-CM | POA: Diagnosis not present

## 2015-01-23 ENCOUNTER — Encounter (HOSPITAL_COMMUNITY): Payer: Self-pay

## 2015-01-24 DIAGNOSIS — G629 Polyneuropathy, unspecified: Secondary | ICD-10-CM | POA: Diagnosis not present

## 2015-01-24 DIAGNOSIS — M545 Low back pain: Secondary | ICD-10-CM | POA: Diagnosis not present

## 2015-01-24 DIAGNOSIS — R350 Frequency of micturition: Secondary | ICD-10-CM | POA: Diagnosis not present

## 2015-01-28 ENCOUNTER — Ambulatory Visit
Admission: RE | Admit: 2015-01-28 | Discharge: 2015-01-28 | Disposition: A | Discharge status: Home / Self Care | Payer: Medicare Other | Source: Ambulatory Visit | Attending: Family Medicine | Admitting: Family Medicine

## 2015-01-28 ENCOUNTER — Other Ambulatory Visit: Payer: Self-pay | Admitting: Family Medicine

## 2015-01-28 ENCOUNTER — Encounter (HOSPITAL_COMMUNITY)
Admission: RE | Admit: 2015-01-28 | Discharge: 2015-01-28 | Disposition: A | Discharge status: Home / Self Care | Payer: Self-pay | Source: Ambulatory Visit | Attending: Internal Medicine | Admitting: Internal Medicine

## 2015-01-28 DIAGNOSIS — M549 Dorsalgia, unspecified: Secondary | ICD-10-CM

## 2015-01-28 DIAGNOSIS — M4186 Other forms of scoliosis, lumbar region: Secondary | ICD-10-CM | POA: Diagnosis not present

## 2015-01-28 DIAGNOSIS — M5137 Other intervertebral disc degeneration, lumbosacral region: Secondary | ICD-10-CM | POA: Diagnosis not present

## 2015-01-28 DIAGNOSIS — Z87828 Personal history of other (healed) physical injury and trauma: Secondary | ICD-10-CM | POA: Diagnosis not present

## 2015-01-30 ENCOUNTER — Encounter (HOSPITAL_COMMUNITY)
Admission: RE | Admit: 2015-01-30 | Discharge: 2015-01-30 | Disposition: A | Discharge status: Home / Self Care | Payer: Self-pay | Source: Ambulatory Visit | Attending: Internal Medicine | Admitting: Internal Medicine

## 2015-02-04 ENCOUNTER — Encounter (HOSPITAL_COMMUNITY)
Admission: RE | Admit: 2015-02-04 | Discharge: 2015-02-04 | Disposition: A | Discharge status: Home / Self Care | Payer: Self-pay | Source: Ambulatory Visit | Attending: Internal Medicine | Admitting: Internal Medicine

## 2015-02-06 ENCOUNTER — Encounter (HOSPITAL_COMMUNITY): Payer: Self-pay

## 2015-02-10 ENCOUNTER — Other Ambulatory Visit: Payer: Self-pay

## 2015-02-11 ENCOUNTER — Encounter (HOSPITAL_COMMUNITY)
Admission: RE | Admit: 2015-02-11 | Discharge: 2015-02-11 | Disposition: A | Discharge status: Home / Self Care | Payer: Self-pay | Source: Ambulatory Visit | Attending: Internal Medicine | Admitting: Internal Medicine

## 2015-02-13 ENCOUNTER — Encounter: Payer: Self-pay | Admitting: Internal Medicine

## 2015-02-13 ENCOUNTER — Encounter (HOSPITAL_COMMUNITY)
Admission: RE | Admit: 2015-02-13 | Discharge: 2015-02-13 | Disposition: A | Discharge status: Home / Self Care | Payer: Self-pay | Source: Ambulatory Visit | Attending: Internal Medicine | Admitting: Internal Medicine

## 2015-02-13 NOTE — Telephone Encounter (Signed)
CY - please advise. Thanks! 

## 2015-02-14 NOTE — Telephone Encounter (Signed)
Order written and signed by Dr. Annamaria Boots, copy put in scan folder to be scanned into patient's chart. Called patient, patient requested that Rx be mailed to him. Advised patient that RX has been placed in the mail, however, the mail has already run today and our office is closed on Monday, so the RX will not go out of the office until Tuesday morning.  Patient says that that will be fine.   Nothing further needed.

## 2015-02-14 NOTE — Telephone Encounter (Signed)
Please make paper script for stair lift for home due to severe COPD with chronic respiratory failure, dependent on oxygen and too short of breath to climb stairs in home.

## 2015-02-18 ENCOUNTER — Encounter (HOSPITAL_COMMUNITY): Payer: Medicare Other

## 2015-02-18 DIAGNOSIS — J449 Chronic obstructive pulmonary disease, unspecified: Secondary | ICD-10-CM | POA: Insufficient documentation

## 2015-02-18 DIAGNOSIS — J984 Other disorders of lung: Secondary | ICD-10-CM | POA: Insufficient documentation

## 2015-02-18 DIAGNOSIS — Z5189 Encounter for other specified aftercare: Secondary | ICD-10-CM | POA: Insufficient documentation

## 2015-02-18 DIAGNOSIS — R0602 Shortness of breath: Secondary | ICD-10-CM | POA: Insufficient documentation

## 2015-02-19 ENCOUNTER — Encounter: Payer: Self-pay | Admitting: Physical Therapy

## 2015-02-19 ENCOUNTER — Ambulatory Visit: Payer: Medicare Other | Attending: Family Medicine | Admitting: Physical Therapy

## 2015-02-19 DIAGNOSIS — M545 Low back pain, unspecified: Secondary | ICD-10-CM

## 2015-02-19 NOTE — Therapy (Signed)
[facility] [address] [place], [place], [address] Phone: [phone]   Fax:  [phone]  Physical Therapy Evaluation  Patient Details  Name: [name] MRN: [id number] Date of Birth: 06/11/1935 Referring Provider:  Vernie Shanks, MD  Encounter Date: 02/19/2015      PT End of Session - 02/19/15 1007    Visit Number 1   Number of Visits 10  Medicare   Date for PT Re-Evaluation 04/16/15   PT Start Time 0930   PT Stop Time 1007   PT Time Calculation (min) 37 min   Activity Tolerance Patient tolerated treatment well   Behavior During Therapy [facility] for tasks assessed/performed      Past Medical History  Diagnosis Date  . COPD (chronic obstructive pulmonary disease)   . Allergic rhinitis, cause unspecified   . Pulmonary nodule   . Emphysema of lung     Past Surgical History  Procedure Laterality Date  . Tonsillectomy    . Appendectomy    . Rotator cuff repair    . Vasectomy      There were no vitals filed for this visit.  Visit Diagnosis:  Right-sided low back pain without sciatica - Plan: PT plan of care cert/re-cert      Subjective Assessment - 02/19/15 0934    Subjective Patient reports low back pain 2 months ago he tripped over his oxygen tubing in the middle to the night and fell. Patient feels he hit the right lumbar area into the chester draws. Patient reports he hit the ground with pain at level 10/10.  Patient reports for 2 weeks needed help to get out of bed. Patient reports pain has been getting better but is still having some pain. Pain is only at night. When he gets up no problem.    How long can you sit comfortably? no diffilculty   How long can you stand comfortably? no difficulty   How long can you walk comfortably? no difficulty   Patient Stated Goals reduce pain   Currently in Pain? Yes   Pain Score 3    Pain Location Back   Pain Orientation Right;Lower   Pain Descriptors /  Indicators Aching   Pain Type Acute pain   Pain Frequency Intermittent   Aggravating Factors  at night   Pain Relieving Factors getting up    Multiple Pain Sites No            [facility] PT Assessment - 02/19/15 0001    Assessment   Medical Diagnosis M54.5 Low back pain   Onset Date/Surgical Date 12/15/14   Prior Therapy None   Precautions   Precautions None   Balance Screen   Has the patient fallen in the past 6 months Yes   How many times? 1   Has the patient had a decrease in activity level because of a fear of falling?  No   Is the patient reluctant to leave their home because of a fear of falling?  No   Prior Function   Level of Independence Independent   Leisure pulmonary rehab   Observation/Other Assessments   Focus on Therapeutic Outcomes (FOTO)  2% limitation   Posture/Postural Control   Posture/Postural Control Postural limitations   Postural Limitations Rounded Shoulders;Forward head;Decreased lumbar lordosis  hips shifted to right   ROM / Strength   AROM / PROM / Strength AROM   AROM   AROM Assessment Site Lumbar   Lumbar Flexion full  deviates to left   Lumbar Extension decreased by 75%  with pain   Lumbar - Right Side Bend decreased by 25%   Lumbar - Left Side Bend full   Lumbar - Right Rotation decreased by 255   Lumbar - Left Rotation decreased by 25%   Palpation   Palpation comment right ilium lower than left, pinpoint tenderness located on posterior right crest of ilium, muscle tighness in left lumbar paraspinals and quadratus   Balance   Balance Assessed Yes   Standardized Balance Assessment   Standardized Balance Assessment Berg Balance Test   Berg Balance Test   Sit to Stand Able to stand without using hands and stabilize independently   Standing Unsupported Able to stand safely 2 minutes   Sitting with Back Unsupported but Feet Supported on Floor or Stool Able to sit safely and securely 2 minutes   Stand to Sit Sits safely with minimal use of  hands   Transfers Able to transfer safely, minor use of hands   Standing Unsupported with Eyes Closed Able to stand 10 seconds safely   Standing Ubsupported with Feet Together Able to place feet together independently and stand 1 minute safely   From Standing, Reach Forward with Outstretched Arm Can reach confidently >25 cm (10")   From Standing Position, Pick up Object from Floor Able to pick up shoe safely and easily   From Standing Position, Turn to Look Behind Over each Shoulder Looks behind from both sides and weight shifts well   Turn 360 Degrees Able to turn 360 degrees safely but slowly   Standing Unsupported, Alternately Place Feet on Step/Stool Able to stand independently and safely and complete 8 steps in 20 seconds   Standing Unsupported, One Foot in [employer] balance while stepping or standing   Standing on One Leg Tries to lift leg/unable to hold 3 seconds but remains standing independently   Total Score 47                           PT Education - 02/19/15 1007    Education provided Yes   Education Details SKC, wall lean, trunk rotation, bridge   Person(s) Educated Patient   Methods Explanation;Demonstration;Tactile cues;Verbal cues;Handout   Comprehension Returned demonstration;Verbalized understanding          PT Short Term Goals - 02/19/15 1000    PT SHORT TERM GOAL #1   Title independent with initial HEP   Time 4   Period Weeks   Status New   PT SHORT TERM GOAL #2   Title understand correct body mechanics with daily activities and lifting   Time 4   Period Weeks   Status New   PT SHORT TERM GOAL #3   Title Berg balance score >/= 50/56   Time 4   Period Weeks   Status New   PT SHORT TERM GOAL #4   Title pain with laying down decreased >/= 25%   Time 4   Period Weeks   Status New           PT Long Term Goals - 02/19/15 1005    PT LONG TERM GOAL #1   Title understand tips to avoid falls   Time 8   Period Weeks   Status New    PT LONG TERM GOAL #2   Title pain with laying down decreased >/= 75%   Time 8   Period Weeks   Status New  PT LONG TERM GOAL #3   Title Berg balance score >/= 54/56   Time 8   Period Weeks   Status New   PT LONG TERM GOAL #4   Title independent with balance and back strengthening exercises   Time 8   Period Weeks   Status New               Plan - 03-06-2015 1007    Clinical Impression Statement Patient is a 80 year old male with diagnosis of low back pain.  Patient injured himself 2 months ago when he fell due to tripping over his oxygen tube in the middle of the night.  Patient reports his lumbar pain is 3/10 on righ tlumbar when he lays down on his back.  Patient posture consists of hips shifted to right, reduced lumbar lordosis, foward head and shoulders. Lumbar ROM deficits include flexion full deviating to the left, extension decreased by 75% with pain, bil. rotation decreased by 25%, and right sidebending decreased by 25%. Berg balance score is 47/56 indicating 50% chance of falling. Pin point tenderness located in crest of right ilium posteriorly.  Patient would benefit from physical therapy to reduce back pain, improve balance and improve mobility.    Pt will benefit from skilled therapeutic intervention in order to improve on the following deficits Decreased activity tolerance;Decreased balance;Impaired flexibility;Decreased strength;Decreased range of motion;Decreased endurance;Decreased mobility;Increased muscle spasms;Pain;Improper body mechanics   Rehab Potential Excellent   Clinical Impairments Affecting Rehab Potential None   PT Frequency 2x / week   PT Duration 4 weeks   PT Treatment/Interventions ADLs/Self Care Home Management;Electrical Stimulation;Cryotherapy;Ultrasound;Traction;Moist Heat;Functional mobility training;Therapeutic activities;Therapeutic exercise;Balance training;Neuromuscular re-education;Manual techniques;Patient/family education;Passive range of  motion   PT Next Visit Plan modalities for pain, soft tissue work to right lumbar area, body mechanics with daily tasks, tips to avoid falls, balance exercises, lumbar stabilization exercises   PT Home Exercise Plan tips to avoid falls   Recommended Other Services None   Consulted and Agree with Plan of Care Patient          G-Codes - 2015-03-06 0924    Functional Assessment Tool Used FOTO score is 2% limitation  goal is 10%   Functional Limitation Other PT primary   Other PT Primary Current Status (S9233) At least 1 percent but less than 20 percent impaired, limited or restricted   Other PT Primary Goal Status (A0762) At least 1 percent but less than 20 percent impaired, limited or restricted       Problem List Patient Active Problem List   Diagnosis Date Noted  . Chronic respiratory failure with hypoxia 01/12/2015  . COPD with acute exacerbation 01/21/2013  . Hyperlipidemia 12/01/2011  . Essential hypertension 12/01/2011  . CAD (coronary artery disease) 05/23/2011  . PULMONARY NODULE 09/16/2008  . ALLERGIC RHINITIS 07/24/2007  . COPD with emphysema 07/24/2007  . BURSITIS 07/24/2007    GRAY,CHERYL,PT 03-06-2015, 10:17 AM  [facility] [address], [place], [place], [address] Phone: [phone]   Fax:  [phone]

## 2015-02-19 NOTE — Patient Instructions (Addendum)
Knee-to-Chest Stretch: Unilateral   With hand behind right knee, pull knee in to chest until a comfortable stretch is felt in lower back and buttocks. Keep back relaxed. Hold _15___ seconds. Repeat _2___ times per set. Do __1__ sets per session. Do __1__ sessions per day.  http://orth.exer.us/126   Copyright  VHI. All rights reserved.  Bridging   Slowly raise buttocks from floor, keeping stomach tight. Repeat __10__ times per set. Do _1___ sets per session. Do _1___ sessions per day.  http://orth.exer.us/1096   Copyright  VHI. All rights reserved.  Wall Lean Stretch   With left hand against wall, slowly stretch hips toward wall, other arm supporting trunk. Hold _30___ seconds. Relax. Repeat _2___ times per set. Do __1__ sets per session. Do __3__ sessions per day.  http://orth.exer.us/102   Copyright  VHI. All rights reserved.  Lower Trunk Rotation Stretch   Keeping back flat and feet together, rotate knees to left side. Hold _15___ seconds. Repeat __2__ times per set. Do _1___ sets per session. Do _1___ sessions per day.  http://orth.exer.us/122   Copyright  VHI. All rights reserved.  [place] [address] [place], [place] [address] Phone # [phone] Fax [phone]

## 2015-02-20 ENCOUNTER — Encounter (HOSPITAL_COMMUNITY): Payer: Self-pay

## 2015-02-24 ENCOUNTER — Ambulatory Visit: Payer: Medicare Other | Admitting: Physical Therapy

## 2015-02-24 ENCOUNTER — Encounter: Payer: Self-pay | Admitting: Physical Therapy

## 2015-02-24 DIAGNOSIS — M545 Low back pain, unspecified: Secondary | ICD-10-CM

## 2015-02-24 NOTE — Therapy (Signed)
[facility] [address] [place], [place], [address] Phone: [phone]   Fax:  [phone]  Physical Therapy Treatment  Patient Details  Name: [name] MRN: [id number] Date of Birth: 1935-05-14 Referring Provider:  Hulan Fess, MD  Encounter Date: 02/24/2015      PT End of Session - 02/24/15 1616    Visit Number 2   Number of Visits 10   Date for PT Re-Evaluation 04/16/15   PT Start Time 1533   PT Stop Time 1629   PT Time Calculation (min) 56 min   Activity Tolerance Patient tolerated treatment well   Behavior During Therapy [facility] for tasks assessed/performed      Past Medical History  Diagnosis Date  . COPD (chronic obstructive pulmonary disease)   . Allergic rhinitis, cause unspecified   . Pulmonary nodule   . Emphysema of lung     Past Surgical History  Procedure Laterality Date  . Tonsillectomy    . Appendectomy    . Rotator cuff repair    . Vasectomy      There were no vitals filed for this visit.  Visit Diagnosis:  Right-sided low back pain without sciatica      Subjective Assessment - 02/24/15 1540    Subjective Pt had only evaluation no major changes reported. He felt 2 month ago, due to tripped over his oxygen tubing in the night. Pt hit his Rt lumbar area on the draws. Pain i mostly present at night   Currently in Pain? No/denies  pain is during the night   Pain Score 3   3 in the night   Pain Location Back   Pain Orientation Right;Lower   Pain Descriptors / Indicators Aching;Constant;Burning   Pain Type Chronic pain   Pain Onset More than a month ago   Multiple Pain Sites No                         [facility] Adult PT Treatment/Exercise - 02/24/15 0001    Balance   Balance Assessed Yes   Exercises   Exercises Lumbar   Lumbar Exercises: Aerobic   Stationary Bike pt declined bike   Lumbar Exercises: Standing   Other Standing Lumbar Exercises SLS x 3 with  10 sec hold x 3 each leg with 1UE support at times   Other Standing Lumbar Exercises educated pt on fall prevention handout given   Modalities   Modalities Moist Heat   Moist Heat Therapy   Number Minutes Moist Heat 15 Minutes   Moist Heat Location Lumbar Spine  in supine, head rest elevated 40degreees   Manual Therapy   Manual Therapy Soft tissue mobilization   Soft tissue mobilization to Rt lumbar area/QL in Left sidelying                PT Education - 02/24/15 1612    Education provided Yes   Education Details fall prevention   Person(s) Educated Patient   Methods Explanation;Verbal cues;Handout   Comprehension Verbalized understanding          PT Short Term Goals - 02/24/15 1732    PT SHORT TERM GOAL #1   Title independent with initial HEP   Time 4   Period Weeks   Status On-going   PT SHORT TERM GOAL #2   Title understand correct body mechanics with daily activities and lifting   Time 4   Period Weeks   Status On-going  PT SHORT TERM GOAL #3   Title Berg balance score >/= 50/56   Time 4   Period Weeks   Status On-going   PT SHORT TERM GOAL #4   Title pain with laying down decreased >/= 25%   Time 4   Period Weeks   Status On-going           PT Long Term Goals - 02/19/15 1005    PT LONG TERM GOAL #1   Title understand tips to avoid falls   Time 8   Period Weeks   Status New   PT LONG TERM GOAL #2   Title pain with laying down decreased >/= 75%   Time 8   Period Weeks   Status New   PT LONG TERM GOAL #3   Title Berg balance score >/= 54/56   Time 8   Period Weeks   Status New   PT LONG TERM GOAL #4   Title independent with balance and back strengthening exercises   Time 8   Period Weeks   Status New               Plan - 02/24/15 1617    Clinical Impression Statement Pt is a 80 year old male with low back pain after a fall 2 month ago. Pt hip is shifted to right, reduced lumbar lordosis, the pain is worse in the night and  wakes him up. Pt was able to incre SLS without support.and is wiht palpaple tendernesson Rt low back   Pt will benefit from skilled therapeutic intervention in order to improve on the following deficits Decreased activity tolerance;Decreased balance;Impaired flexibility;Decreased strength;Decreased range of motion;Decreased endurance;Decreased mobility;Increased muscle spasms;Pain;Improper body mechanics   Rehab Potential Excellent   PT Frequency 2x / week   PT Duration 4 weeks   PT Treatment/Interventions ADLs/Self Care Home Management;Electrical Stimulation;Cryotherapy;Ultrasound;Traction;Moist Heat;Functional mobility training;Therapeutic activities;Therapeutic exercise;Balance training;Neuromuscular re-education;Manual techniques;Patient/family education;Passive range of motion   PT Next Visit Plan Review,  Trunk rotation, Bridge, SKC,  wall lean, SLS, softtissue work modalities PRN.   PT Home Exercise Plan current HEP   Consulted and Agree with Plan of Care Patient        Problem List Patient Active Problem List   Diagnosis Date Noted  . Chronic respiratory failure with hypoxia 01/12/2015  . COPD with acute exacerbation 01/21/2013  . Hyperlipidemia 12/01/2011  . Essential hypertension 12/01/2011  . CAD (coronary artery disease) 05/23/2011  . PULMONARY NODULE 09/16/2008  . ALLERGIC RHINITIS 07/24/2007  . COPD with emphysema 07/24/2007  . BURSITIS 07/24/2007    NAUMANN-HOUEGNIFIO,Mitchell Epling PTA 02/24/2015, 5:39 PM  [facility] [address] [place], [place], [address] Phone: [phone]   Fax:  [phone]

## 2015-02-24 NOTE — Patient Instructions (Addendum)

## 2015-02-25 ENCOUNTER — Encounter (HOSPITAL_COMMUNITY)
Admission: RE | Admit: 2015-02-25 | Discharge: 2015-02-25 | Disposition: A | Discharge status: Home / Self Care | Payer: Self-pay | Source: Ambulatory Visit | Attending: Internal Medicine | Admitting: Internal Medicine

## 2015-02-26 ENCOUNTER — Ambulatory Visit: Payer: Medicare Other

## 2015-02-26 DIAGNOSIS — M545 Low back pain, unspecified: Secondary | ICD-10-CM

## 2015-02-26 NOTE — Therapy (Signed)
[facility] [address], [place], [place], [address] Phone: [phone]   Fax:  [phone]  Physical Therapy Treatment  Patient Details  Name: [name] MRN: [id number] Date of Birth: 05-01-1935 Referring Provider:  Hulan Fess, MD  Encounter Date: 02/26/2015      PT End of Session - 02/26/15 1053    Visit Number 3   Number of Visits 10  Medicare   Date for PT Re-Evaluation 04/16/15   PT Start Time 1016   PT Stop Time 1106   PT Time Calculation (min) 50 min   Activity Tolerance Patient tolerated treatment well   Behavior During Therapy [facility] for tasks assessed/performed      Past Medical History  Diagnosis Date  . COPD (chronic obstructive pulmonary disease)   . Allergic rhinitis, cause unspecified   . Pulmonary nodule   . Emphysema of lung     Past Surgical History  Procedure Laterality Date  . Tonsillectomy    . Appendectomy    . Rotator cuff repair    . Vasectomy      There were no vitals filed for this visit.  Visit Diagnosis:  Right-sided low back pain without sciatica      Subjective Assessment - 02/26/15 1017    Subjective Heat and manual have really helped the pain.  Woke up with a lot less pain yesterday morning.     Currently in Pain? No/denies  Pt rates LBP as 3/10 last night with sleep                         [facility] Adult PT Treatment/Exercise - 02/26/15 0001    Exercises   Exercises Knee/Hip   Lumbar Exercises: Stretches   Active Hamstring Stretch 3 reps;20 seconds  seated and supine with strap   Single Knee to Chest Stretch 3 reps;20 seconds   Double Knee to Chest Stretch 3 reps;20 seconds   Lower Trunk Rotation 3 reps;20 seconds   Lumbar Exercises: Supine   Bridge 5 seconds;20 reps   Knee/Hip Exercises: Standing   Hip Abduction Stengthening;Both;2 sets;10 reps   Modalities   Modalities Moist Heat   Moist Heat Therapy   Number Minutes Moist Heat  15 Minutes   Moist Heat Location Lumbar Spine   Manual Therapy   Manual Therapy Soft tissue mobilization   Soft tissue mobilization to Rt lumbar area/QL in Left sidelying                  PT Short Term Goals - 02/24/15 1732    PT SHORT TERM GOAL #1   Title independent with initial HEP   Time 4   Period Weeks   Status On-going   PT SHORT TERM GOAL #2   Title understand correct body mechanics with daily activities and lifting   Time 4   Period Weeks   Status On-going   PT SHORT TERM GOAL #3   Title Berg balance score >/= 50/56   Time 4   Period Weeks   Status On-going   PT SHORT TERM GOAL #4   Title pain with laying down decreased >/= 25%   Time 4   Period Weeks   Status On-going           PT Long Term Goals - 02/19/15 1005    PT LONG TERM GOAL #1   Title understand tips to avoid falls   Time 8   Period Weeks  Status New   PT LONG TERM GOAL #2   Title pain with laying down decreased >/= 75%   Time 8   Period Weeks   Status New   PT LONG TERM GOAL #3   Title Berg balance score >/= 54/56   Time 8   Period Weeks   Status New   PT LONG TERM GOAL #4   Title independent with balance and back strengthening exercises   Time 8   Period Weeks   Status New               Plan - 02/26/15 1025    Clinical Impression Statement Pt reports 30% overall improvement in pain since the start of care.  Pt with pain in the lumbar spine only at night and rates it 3/10. Pt with increased challenge with standing hip abduction exercise.  Pt is independent in initial HEP and able to demonstrate correclty.  Pt will benefit from continued flexibiity, manual, and balance exercises to reduce pain and reduce falls risk.     Pt will benefit from skilled therapeutic intervention in order to improve on the following deficits Decreased activity tolerance;Decreased balance;Impaired flexibility;Decreased strength;Decreased range of motion;Decreased endurance;Decreased  mobility;Increased muscle spasms;Pain;Improper body mechanics   Rehab Potential Good   PT Frequency 2x / week   PT Duration 4 weeks   PT Treatment/Interventions ADLs/Self Care Home Management;Electrical Stimulation;Cryotherapy;Ultrasound;Traction;Moist Heat;Functional mobility training;Therapeutic activities;Therapeutic exercise;Balance training;Neuromuscular re-education;Manual techniques;Patient/family education;Passive range of motion   PT Next Visit Plan Continue flexibility, strength, balance, manual and heat as needed.  Add hamstring strech and standing hip abduction to HEP   Consulted and Agree with Plan of Care Patient        Problem List Patient Active Problem List   Diagnosis Date Noted  . Chronic respiratory failure with hypoxia 01/12/2015  . COPD with acute exacerbation 01/21/2013  . Hyperlipidemia 12/01/2011  . Essential hypertension 12/01/2011  . CAD (coronary artery disease) 05/23/2011  . PULMONARY NODULE 09/16/2008  . ALLERGIC RHINITIS 07/24/2007  . COPD with emphysema 07/24/2007  . BURSITIS 07/24/2007    Revere Maahs, PT 02/26/2015, 10:56 AM  [facility] [address] [place], [place], [address] Phone: [phone]   Fax:  [phone]

## 2015-02-27 ENCOUNTER — Encounter (HOSPITAL_COMMUNITY): Payer: Self-pay

## 2015-03-04 ENCOUNTER — Encounter (HOSPITAL_COMMUNITY)
Admission: RE | Admit: 2015-03-04 | Discharge: 2015-03-04 | Disposition: A | Discharge status: Home / Self Care | Payer: Self-pay | Source: Ambulatory Visit | Attending: Internal Medicine | Admitting: Internal Medicine

## 2015-03-05 ENCOUNTER — Ambulatory Visit: Payer: Medicare Other | Admitting: Physical Therapy

## 2015-03-05 DIAGNOSIS — M545 Low back pain, unspecified: Secondary | ICD-10-CM

## 2015-03-05 NOTE — Therapy (Signed)
[facility] [address], [place], [place], [address] Phone: [phone]   Fax:  [phone]  Physical Therapy Treatment  Patient Details  Name: [name] MRN: [id number] Date of Birth: [birth year] Referring Provider:  Vernie Shanks, MD  Encounter Date: 03/05/2015      PT End of Session - 03/05/15 1110    Visit Number 4   Number of Visits 10   Date for PT Re-Evaluation 04/16/15   PT Start Time 1015   PT Stop Time 1117   PT Time Calculation (min) 62 min   Activity Tolerance Patient tolerated treatment well   Behavior During Therapy [facility] for tasks assessed/performed      Past Medical History  Diagnosis Date  . COPD (chronic obstructive pulmonary disease)   . Allergic rhinitis, cause unspecified   . Pulmonary nodule   . Emphysema of lung     Past Surgical History  Procedure Laterality Date  . Tonsillectomy    . Appendectomy    . Rotator cuff repair    . Vasectomy      There were no vitals filed for this visit.  Visit Diagnosis:  Right-sided low back pain without sciatica      Subjective Assessment - 03/05/15 1031    Subjective Pt reports feeling much better with his back, stretching and modalities help   Currently in Pain? No/denies                         [facility] Adult PT Treatment/Exercise - 03/05/15 0001    Exercises   Exercises Lumbar   Lumbar Exercises: Stretches   Active Hamstring Stretch 3 reps;20 seconds  sitted and supine with strap   Single Knee to Chest Stretch 3 reps;20 seconds   Double Knee to Chest Stretch 3 reps;20 seconds   Lower Trunk Rotation 3 reps;20 seconds   Lumbar Exercises: Standing   Other Standing Lumbar Exercises SLS x 3 with 10 sec hold x 3 each leg with 1UE support at times   Lumbar Exercises: Seated   Other Seated Lumbar Exercises green ball ant/post side/side pelvic movement x62min   Lumbar Exercises: Supine   Bridge 5 seconds;20 reps   Knee/Hip Exercises: Standing   Hip Abduction Stengthening;Both;2 sets;10 reps   Modalities   Modalities Moist Heat;Electrical Stimulation   Moist Heat Therapy   Number Minutes Moist Heat 15 Minutes   Moist Heat Location Lumbar Spine   Electrical Stimulation   Electrical Stimulation Location lumbar Rt side   Electrical Stimulation Action IFC   Electrical Stimulation Parameters 80-150Hz    Electrical Stimulation Goals Tone  to release muscle tension on QL Rt side   Manual Therapy   Manual Therapy Soft tissue mobilization   Soft tissue mobilization to Rt lumbar area/QL in Left sidelying                  PT Short Term Goals - 03/05/15 1120    PT SHORT TERM GOAL #1   Title independent with initial HEP   Time 4   Period Weeks   Status On-going   PT SHORT TERM GOAL #2   Title understand correct body mechanics with daily activities and lifting   Time 4   Period Weeks   Status Achieved   PT SHORT TERM GOAL #3   Title Berg balance score >/= 50/56   Time 4   Period Weeks   Status On-going   PT SHORT TERM  GOAL #4   Title pain with laying down decreased >/= 25%   Time 4   Period Weeks   Status On-going           PT Long Term Goals - 02/19/15 1005    PT LONG TERM GOAL #1   Title understand tips to avoid falls   Time 8   Period Weeks   Status New   PT LONG TERM GOAL #2   Title pain with laying down decreased >/= 75%   Time 8   Period Weeks   Status New   PT LONG TERM GOAL #3   Title Berg balance score >/= 54/56   Time 8   Period Weeks   Status New   PT LONG TERM GOAL #4   Title independent with balance and back strengthening exercises   Time 8   Period Weeks   Status New               Plan - 03/05/15 1110    Clinical Impression Statement Pt reports 40% improvement in pain since start of care. Now he is able to sleep better. Pt with tight hamstrings as observed with his stretching. Pt will continue to benefit from skilled PT    Pt will benefit  from skilled therapeutic intervention in order to improve on the following deficits Decreased activity tolerance;Decreased balance;Impaired flexibility;Decreased strength;Decreased range of motion;Decreased endurance;Decreased mobility;Increased muscle spasms;Pain;Improper body mechanics   Rehab Potential Good   PT Frequency 2x / week   PT Duration 4 weeks   PT Next Visit Plan Continue with e-stim if beneficial, continue stretching Hamstrings and flexibility lumbar   Consulted and Agree with Plan of Care Patient        Problem List Patient Active Problem List   Diagnosis Date Noted  . Chronic respiratory failure with hypoxia 01/12/2015  . COPD with acute exacerbation 01/21/2013  . Hyperlipidemia 12/01/2011  . Essential hypertension 12/01/2011  . CAD (coronary artery disease) 05/23/2011  . PULMONARY NODULE 09/16/2008  . ALLERGIC RHINITIS 07/24/2007  . COPD with emphysema 07/24/2007  . BURSITIS 07/24/2007    NAUMANN-HOUEGNIFIO,Jeralyn Nolden PTA 03/05/2015, 11:22 AM  [facility] [address] [place], [place], [address] Phone: [phone]   Fax:  [phone]

## 2015-03-06 ENCOUNTER — Encounter (HOSPITAL_COMMUNITY)
Admission: RE | Admit: 2015-03-06 | Discharge: 2015-03-06 | Disposition: A | Discharge status: Home / Self Care | Payer: Self-pay | Source: Ambulatory Visit | Attending: Internal Medicine | Admitting: Internal Medicine

## 2015-03-07 ENCOUNTER — Ambulatory Visit: Payer: Medicare Other | Admitting: Physical Therapy

## 2015-03-07 ENCOUNTER — Encounter: Payer: Self-pay | Admitting: Physical Therapy

## 2015-03-07 DIAGNOSIS — M545 Low back pain, unspecified: Secondary | ICD-10-CM

## 2015-03-07 NOTE — Therapy (Signed)
[facility] [address] [place], [place], [address] Phone: [phone]   Fax:  [phone]  Physical Therapy Treatment  Patient Details  Name: [name] MRN: [id number] Date of Birth: May 12, 1935 Referring Provider:  Vernie Shanks, MD  Encounter Date: 03/07/2015      PT End of Session - 03/07/15 1058    Visit Number 5   Number of Visits 10   Date for PT Re-Evaluation 04/16/15   PT Start Time 1016   PT Stop Time 1117   PT Time Calculation (min) 61 min   Activity Tolerance Patient tolerated treatment well   Behavior During Therapy [facility] for tasks assessed/performed      Past Medical History  Diagnosis Date  . COPD (chronic obstructive pulmonary disease)   . Allergic rhinitis, cause unspecified   . Pulmonary nodule   . Emphysema of lung     Past Surgical History  Procedure Laterality Date  . Tonsillectomy    . Appendectomy    . Rotator cuff repair    . Vasectomy      There were no vitals filed for this visit.  Visit Diagnosis:  Right-sided low back pain without sciatica      Subjective Assessment - 03/07/15 1041    Subjective Pt reports 99% improvment with his low back, but still concerned it can come back any time   Currently in Pain? No/denies                         [facility] Adult PT Treatment/Exercise - 03/07/15 0001    Bed Mobility   Bed Mobility --  practiced proper bodymechanis /logrolling   Exercises   Exercises Lumbar   Lumbar Exercises: Stretches   Active Hamstring Stretch 3 reps;20 seconds  at stairs, sitting & supine   Single Knee to Chest Stretch 3 reps;20 seconds   Double Knee to Chest Stretch 3 reps;20 seconds   Lower Trunk Rotation 3 reps;20 seconds   Lumbar Exercises: Standing   Other Standing Lumbar Exercises SLS x 3 with 10 sec hold x 3 each leg with 1UE support at times   Lumbar Exercises: Seated   Other Seated Lumbar Exercises green ball ant/post  side/side pelvic movement x74min   Lumbar Exercises: Supine   Bridge 5 seconds;20 reps   Modalities   Modalities Moist Heat;Electrical Stimulation   Moist Heat Therapy   Number Minutes Moist Heat 15 Minutes   Moist Heat Location Lumbar Spine   Electrical Stimulation   Electrical Stimulation Location lumbar Rt side   Electrical Stimulation Action IFC   Electrical Stimulation Parameters 80-150Hz    Electrical Stimulation Goals Tone  to release muscle tension low back and QL on Rt area                  PT Short Term Goals - 03/05/15 1120    PT SHORT TERM GOAL #1   Title independent with initial HEP   Time 4   Period Weeks   Status On-going   PT SHORT TERM GOAL #2   Title understand correct body mechanics with daily activities and lifting   Time 4   Period Weeks   Status Achieved   PT SHORT TERM GOAL #3   Title Berg balance score >/= 50/56   Time 4   Period Weeks   Status On-going   PT SHORT TERM GOAL #4   Title pain with laying down decreased >/= 25%  Time 4   Period Weeks   Status On-going           PT Long Term Goals - 02/19/15 1005    PT LONG TERM GOAL #1   Title understand tips to avoid falls   Time 8   Period Weeks   Status New   PT LONG TERM GOAL #2   Title pain with laying down decreased >/= 75%   Time 8   Period Weeks   Status New   PT LONG TERM GOAL #3   Title Berg balance score >/= 54/56   Time 8   Period Weeks   Status New   PT LONG TERM GOAL #4   Title independent with balance and back strengthening exercises   Time 8   Period Weeks   Status New               Plan - 03/07/15 1058    Clinical Impression Statement Pt reports 99% improvement, he still needs verbal and tactile cues for proper technique and presents with tight ahamstrings and decreased lumbar flexibility. Pt will continue to benefit from skilled PT   Pt will benefit from skilled therapeutic intervention in order to improve on the following deficits Decreased  activity tolerance;Decreased balance;Impaired flexibility;Decreased strength;Decreased range of motion;Decreased endurance;Decreased mobility;Increased muscle spasms;Pain;Improper body mechanics   Rehab Potential Good   PT Frequency 2x / week   PT Duration 4 weeks   PT Treatment/Interventions ADLs/Self Care Home Management;Electrical Stimulation;Cryotherapy;Ultrasound;Traction;Moist Heat;Functional mobility training;Therapeutic activities;Therapeutic exercise;Balance training;Neuromuscular re-education;Manual techniques;Patient/family education;Passive range of motion   PT Next Visit Plan discuss continue of PT v/s D/C. Continue with e-stim since that helped relieve muscle tension, continue stretching Hamstrings and flexibility lumbar   Consulted and Agree with Plan of Care Patient        Problem List Patient Active Problem List   Diagnosis Date Noted  . Chronic respiratory failure with hypoxia 01/12/2015  . COPD with acute exacerbation 01/21/2013  . Hyperlipidemia 12/01/2011  . Essential hypertension 12/01/2011  . CAD (coronary artery disease) 05/23/2011  . PULMONARY NODULE 09/16/2008  . ALLERGIC RHINITIS 07/24/2007  . COPD with emphysema 07/24/2007  . BURSITIS 07/24/2007    NAUMANN-HOUEGNIFIO,Dela Sweeny PTA 03/07/2015, 11:03 AM  [facility] [address], [place], [place], [address] Phone: [phone]   Fax:  [phone]

## 2015-03-10 ENCOUNTER — Encounter: Payer: Self-pay | Admitting: Physical Therapy

## 2015-03-10 ENCOUNTER — Ambulatory Visit: Payer: Medicare Other | Admitting: Physical Therapy

## 2015-03-10 DIAGNOSIS — M545 Low back pain, unspecified: Secondary | ICD-10-CM

## 2015-03-10 NOTE — Therapy (Signed)
[facility] [address] [place], [place], [address] Phone: [phone]   Fax:  [phone]  Physical Therapy Treatment  Patient Details  Name: [name] MRN: [id number] Date of Birth: [birth year] Referring Provider:  Vernie Shanks, MD  Encounter Date: 03/10/2015      PT End of Session - 03/10/15 1103    Visit Number 6   Number of Visits 10   Date for PT Re-Evaluation 04/16/15   PT Start Time 1015   PT Stop Time 1115   PT Time Calculation (min) 60 min   Activity Tolerance Patient tolerated treatment well   Behavior During Therapy [facility] for tasks assessed/performed      Past Medical History  Diagnosis Date  . COPD (chronic obstructive pulmonary disease)   . Allergic rhinitis, cause unspecified   . Pulmonary nodule   . Emphysema of lung     Past Surgical History  Procedure Laterality Date  . Tonsillectomy    . Appendectomy    . Rotator cuff repair    . Vasectomy      There were no vitals filed for this visit.  Visit Diagnosis:  Right-sided low back pain without sciatica      Subjective Assessment - 03/10/15 1036    Subjective Pt reports QL tightness is 100% improved, but mid low back is still painful in the early morning rated as 3/10   Currently in Pain? No/denies  but in the early morning rated as 3/10   Multiple Pain Sites No                         [facility] Adult PT Treatment/Exercise - 03/10/15 0001    Exercises   Exercises Lumbar   Lumbar Exercises: Stretches   Active Hamstring Stretch 3 reps;20 seconds  at stairs, sitting and supine   Single Knee to Chest Stretch 3 reps;20 seconds   Double Knee to Chest Stretch 3 reps;20 seconds   Lower Trunk Rotation 3 reps;20 seconds   Lumbar Exercises: Standing   Other Standing Lumbar Exercises --   Lumbar Exercises: Seated   Other Seated Lumbar Exercises green ball ant/post side/side pelvic movement x48min  pt with improved  tech nique less compensatory movement   Lumbar Exercises: Supine   Bridge 5 seconds;20 reps  after task incr pain in low back    Modalities   Modalities Moist Heat;Electrical Stimulation   Moist Heat Therapy   Number Minutes Moist Heat 15 Minutes   Moist Heat Location Lumbar Spine   Electrical Stimulation   Electrical Stimulation Location lumbar mid back   Electrical Stimulation Action IFC   Electrical Stimulation Parameters 80-150Hz    Electrical Stimulation Goals Tone   Manual Therapy   Manual Therapy Soft tissue mobilization  in prone to lumbar paraspinalis bil                   PT Short Term Goals - 03/10/15 1034    PT SHORT TERM GOAL #2   Title understand correct body mechanics with daily activities and lifting   Time 4   Period Weeks   Status Achieved   PT SHORT TERM GOAL #3   Title Berg balance score >/= 50/56   Time 4   Period Weeks   Status On-going   PT SHORT TERM GOAL #4   Title pain with laying down decreased >/= 25%   Time 4   Period Weeks   Status  Achieved           PT Long Term Goals - 03/10/15 1032    PT LONG TERM GOAL #1   Title understand tips to avoid falls   Time 8   Period Weeks   Status On-going   PT LONG TERM GOAL #2   Title pain with laying down decreased >/= 75%   Time 8   Period Weeks   Status Achieved   PT LONG TERM GOAL #4   Title independent with balance and back strengthening exercises   Time 8   Status On-going               Plan - 03/10/15 1104    Clinical Impression Statement Pt was feeling great, but after strengthening exercise -Bridging- muscle spasm in low back, needed to change activity   Rehab Potential Good   PT Frequency 2x / week   PT Duration 4 weeks   PT Next Visit Plan Pt decided to continue with PT due to pain in mid low back and muscle cramping at times   Consulted and Agree with Plan of Care Patient        Problem List Patient Active Problem List   Diagnosis Date Noted  . Chronic  respiratory failure with hypoxia 01/12/2015  . COPD with acute exacerbation 01/21/2013  . Hyperlipidemia 12/01/2011  . Essential hypertension 12/01/2011  . CAD (coronary artery disease) 05/23/2011  . PULMONARY NODULE 09/16/2008  . ALLERGIC RHINITIS 07/24/2007  . COPD with emphysema 07/24/2007  . BURSITIS 07/24/2007    NAUMANN-HOUEGNIFIO,Nori Poland PTA 03/10/2015, 11:15 AM  [facility] [address] [place], [place], [address] Phone: [phone]   Fax:  [phone]

## 2015-03-11 ENCOUNTER — Encounter (HOSPITAL_COMMUNITY): Payer: Self-pay

## 2015-03-12 ENCOUNTER — Encounter: Payer: Self-pay | Admitting: Physical Therapy

## 2015-03-12 ENCOUNTER — Ambulatory Visit: Payer: Medicare Other | Admitting: Physical Therapy

## 2015-03-12 DIAGNOSIS — M545 Low back pain, unspecified: Secondary | ICD-10-CM

## 2015-03-12 NOTE — Patient Instructions (Signed)
  SUPINE  1. ELASTIC BAND pull   Picture on top       Each exercise x 10  With 2 repetition    2. Then pull and move both arms over your head   No picture 3. And horizontal ABUCTION  With all exercises above add pelvic floor contraction, belly toward your spine and imagine bringing your hip bones closer together  Lie on your back holding an elastic band up towards the ceiling. Next, pull your arms apart and towards the floor as shown.

## 2015-03-12 NOTE — Therapy (Signed)
[facility] [address] [place], [place], [address] Phone: [phone]   Fax:  [phone]  Physical Therapy Treatment  Patient Details  Name: DESMUND ELMAN MRN: [id number] Date of Birth: 1934/09/01 Referring Provider:  Vernie Shanks, MD  Encounter Date: 03/12/2015      PT End of Session - 03/12/15 1037    Visit Number 7   Number of Visits 10   Date for PT Re-Evaluation 04/16/15   PT Start Time 1015   PT Stop Time 1117   PT Time Calculation (min) 62 min   Activity Tolerance Patient tolerated treatment well   Behavior During Therapy [facility] for tasks assessed/performed      Past Medical History  Diagnosis Date  . COPD (chronic obstructive pulmonary disease)   . Allergic rhinitis, cause unspecified   . Pulmonary nodule   . Emphysema of lung     Past Surgical History  Procedure Laterality Date  . Tonsillectomy    . Appendectomy    . Rotator cuff repair    . Vasectomy      There were no vitals filed for this visit.  Visit Diagnosis:  Right-sided low back pain without sciatica      Subjective Assessment - 03/12/15 1023    Subjective Pt rates his pain in mid low back today as 2/10, worse in the morning   How long can you sit comfortably? no diffilculty   How long can you stand comfortably? no difficulty   How long can you walk comfortably? no difficulty   Patient Stated Goals reduce pain   Currently in Pain? Yes   Pain Score 2    Pain Location Back   Pain Orientation Mid   Pain Descriptors / Indicators Aching;Constant;Burning   Pain Type Chronic pain   Pain Onset More than a month ago   Pain Frequency Intermittent   Aggravating Factors  at night and in the morning   Pain Relieving Factors getting up, moving   Multiple Pain Sites No                         [facility] Adult PT Treatment/Exercise - 03/12/15 0001    Exercises   Exercises Lumbar   Lumbar Exercises: Stretches   Active Hamstring Stretch 3 reps;20 seconds  in standing at stairs, sitting and supine   Single Knee to Chest Stretch 3 reps;20 seconds   Double Knee to Chest Stretch 3 reps;20 seconds   Lower Trunk Rotation 3 reps;20 seconds   Lumbar Exercises: Standing   Other Standing Lumbar Exercises SLS x 3 with 10 sec hold x 3 each leg with 1UE support at times   Other Standing Lumbar Exercises red tband unilateral abdominal strength   Lumbar Exercises: Seated   Other Seated Lumbar Exercises green ball ant/post side/side pelvic movement x26min   Lumbar Exercises: Supine   Bridge --  not practiced since caused back pain last time   Other Supine Lumbar Exercises red t-band pull, overhead pull and abduction   x 10 each   Modalities   Modalities Moist Heat;Electrical Stimulation   Moist Heat Therapy   Number Minutes Moist Heat 15 Minutes   Moist Heat Location Lumbar Spine   Electrical Stimulation   Electrical Stimulation Location lumbar mid back   Electrical Stimulation Action IFC   Electrical Stimulation Parameters 80-150Hz    Electrical Stimulation Goals Tone   Manual Therapy   Manual Therapy Soft tissue mobilization  in prone to lumbar paraspinalis bil                 PT Education - 03/12/15 1057    Education provided Yes   Education Details supine Meeks: pull, overhead pull and abduction   Person(s) Educated Patient   Methods Explanation;Verbal cues;Handout   Comprehension Verbalized understanding;Returned demonstration          PT Short Term Goals - 03/10/15 1034    PT SHORT TERM GOAL #2   Title understand correct body mechanics with daily activities and lifting   Time 4   Period Weeks   Status Achieved   PT SHORT TERM GOAL #3   Title Berg balance score >/= 50/56   Time 4   Period Weeks   Status On-going   PT SHORT TERM GOAL #4   Title pain with laying down decreased >/= 25%   Time 4   Period Weeks   Status Achieved           PT Long Term Goals - 03/10/15  1032    PT LONG TERM GOAL #1   Title understand tips to avoid falls   Time 8   Period Weeks   Status On-going   PT LONG TERM GOAL #2   Title pain with laying down decreased >/= 75%   Time 8   Period Weeks   Status Achieved   PT LONG TERM GOAL #4   Title independent with balance and back strengthening exercises   Time 8   Status On-going               Plan - 03/12/15 1037    Clinical Impression Statement Pt reports his discomfort today as 2/10, able to participate well in PT, but no bridging today due to aggravation last time   Pt will benefit from skilled therapeutic intervention in order to improve on the following deficits Decreased activity tolerance;Decreased balance;Impaired flexibility;Decreased strength;Decreased range of motion;Decreased endurance;Decreased mobility;Increased muscle spasms;Pain;Improper body mechanics   Rehab Potential Good   PT Frequency 2x / week   PT Duration 4 weeks   PT Treatment/Interventions ADLs/Self Care Home Management;Electrical Stimulation;Cryotherapy;Ultrasound;Traction;Moist Heat;Functional mobility training;Therapeutic activities;Therapeutic exercise;Balance training;Neuromuscular re-education;Manual techniques;Patient/family education;Passive range of motion   PT Next Visit Plan continue with stretching and strengthening exercises        Problem List Patient Active Problem List   Diagnosis Date Noted  . Chronic respiratory failure with hypoxia 01/12/2015  . COPD with acute exacerbation 01/21/2013  . Hyperlipidemia 12/01/2011  . Essential hypertension 12/01/2011  . CAD (coronary artery disease) 05/23/2011  . PULMONARY NODULE 09/16/2008  . ALLERGIC RHINITIS 07/24/2007  . COPD with emphysema 07/24/2007  . BURSITIS 07/24/2007    NAUMANN-HOUEGNIFIO,Jarel Cuadra PTA 03/12/2015, 11:27 AM  [facility] [address] [place], [place], [address] Phone: [phone]   Fax:   [phone]

## 2015-03-13 ENCOUNTER — Encounter (HOSPITAL_COMMUNITY)
Admission: RE | Admit: 2015-03-13 | Discharge: 2015-03-13 | Disposition: A | Discharge status: Home / Self Care | Payer: Self-pay | Source: Ambulatory Visit | Attending: Internal Medicine | Admitting: Internal Medicine

## 2015-03-18 ENCOUNTER — Encounter (HOSPITAL_COMMUNITY)
Admission: RE | Admit: 2015-03-18 | Discharge: 2015-03-18 | Disposition: A | Discharge status: Home / Self Care | Payer: Self-pay | Source: Ambulatory Visit | Attending: Internal Medicine | Admitting: Internal Medicine

## 2015-03-18 ENCOUNTER — Ambulatory Visit: Payer: Medicare Other | Attending: Family Medicine | Admitting: Physical Therapy

## 2015-03-18 ENCOUNTER — Encounter: Payer: Self-pay | Admitting: Physical Therapy

## 2015-03-18 DIAGNOSIS — J449 Chronic obstructive pulmonary disease, unspecified: Secondary | ICD-10-CM | POA: Insufficient documentation

## 2015-03-18 DIAGNOSIS — M545 Low back pain, unspecified: Secondary | ICD-10-CM

## 2015-03-18 DIAGNOSIS — Z5189 Encounter for other specified aftercare: Secondary | ICD-10-CM | POA: Insufficient documentation

## 2015-03-18 DIAGNOSIS — J984 Other disorders of lung: Secondary | ICD-10-CM | POA: Insufficient documentation

## 2015-03-18 DIAGNOSIS — R0602 Shortness of breath: Secondary | ICD-10-CM | POA: Insufficient documentation

## 2015-03-18 NOTE — Therapy (Signed)
[facility] [address], [place], [place], [address] Phone: [phone]   Fax:  [phone]  Physical Therapy Treatment  Patient Details  Name: [name] MRN: [id number] Date of Birth: 1934-12-29 Referring Provider:  Hulan Fess, MD  Encounter Date: 03/18/2015      PT End of Session - 03/18/15 1410    Visit Number 8   Number of Visits 10   Date for PT Re-Evaluation 04/16/15   PT Start Time 1400   PT Stop Time 1440   PT Time Calculation (min) 40 min   Activity Tolerance Patient tolerated treatment well   Behavior During Therapy [facility] for tasks assessed/performed      Past Medical History  Diagnosis Date  . COPD (chronic obstructive pulmonary disease)   . Allergic rhinitis, cause unspecified   . Pulmonary nodule   . Emphysema of lung     Past Surgical History  Procedure Laterality Date  . Tonsillectomy    . Appendectomy    . Rotator cuff repair    . Vasectomy      There were no vitals filed for this visit.  Visit Diagnosis:  Right-sided low back pain without sciatica      Subjective Assessment - 03/18/15 1403    Subjective The right side pain is gone.  I have soreness in the base of spine when I wake up in the morning.  I feel like I am ready for discharge.    How long can you sit comfortably? no diffilculty   How long can you stand comfortably? no difficulty   How long can you walk comfortably? no difficulty   Patient Stated Goals reduce pain   Currently in Pain? Yes   Pain Score 2    Pain Location Back   Pain Orientation Lower   Pain Descriptors / Indicators Aching   Pain Type Chronic pain   Pain Onset More than a month ago   Pain Frequency Intermittent   Aggravating Factors  when wake up in the morning   Pain Relieving Factors getting up and moving   Multiple Pain Sites No            [facility] PT Assessment - 03/18/15 0001    Observation/Other Assessments   Focus on Therapeutic  Outcomes (FOTO)  0% limitation due to being on oxygen but none for his back   AROM   Lumbar Extension full   Lumbar - Right Side Bend full   Lumbar - Right Rotation full   Lumbar - Left Rotation full   Berg Balance Test   Sit to Stand Able to stand without using hands and stabilize independently   Standing Unsupported Able to stand safely 2 minutes   Sitting with Back Unsupported but Feet Supported on Floor or Stool Able to sit safely and securely 2 minutes   Stand to Sit Sits safely with minimal use of hands   Transfers Able to transfer safely, minor use of hands   Standing Unsupported with Eyes Closed Able to stand 10 seconds safely   Standing Ubsupported with Feet Together Able to place feet together independently and stand 1 minute safely   From Standing, Reach Forward with Outstretched Arm Can reach confidently >25 cm (10")   From Standing Position, Pick up Object from Floor Able to pick up shoe safely and easily   From Standing Position, Turn to Look Behind Over each Shoulder Looks behind from both sides and weight shifts well   Turn  360 Degrees Able to turn 360 degrees safely in 4 seconds or less   Standing Unsupported, Alternately Place Feet on Step/Stool Able to stand independently and safely and complete 8 steps in 20 seconds   Standing Unsupported, One Foot in [place] to place foot tandem independently and hold 30 seconds   Standing on One Leg Able to lift leg independently and hold > 10 seconds   Total Score 56                     [facility] Adult PT Treatment/Exercise - 03/18/15 0001    Lumbar Exercises: Stretches   Active Hamstring Stretch 3 reps;20 seconds  in standing at stairs, sitting and supine   Single Knee to Chest Stretch 3 reps;20 seconds   Double Knee to Chest Stretch 3 reps;20 seconds   Lower Trunk Rotation 3 reps;20 seconds   Lumbar Exercises: Supine   Other Supine Lumbar Exercises hookly horizontal abduction 10x blue band;                  PT Education - 03/18/15 1444    Education provided Yes   Education Details interscapular strengthening, tips to avoid falls, tandem stance, single leg stance   Person(s) Educated Patient   Methods Explanation;Demonstration;Verbal cues;Handout   Comprehension Verbalized understanding;Returned demonstration;Verbal cues required          PT Short Term Goals - 03/18/15 1410    PT SHORT TERM GOAL #1   Title independent with initial HEP   Time 4   Period Weeks   Status Achieved   PT SHORT TERM GOAL #2   Title understand correct body mechanics with daily activities and lifting   Time 4   Period Weeks   Status Achieved   PT SHORT TERM GOAL #3   Title Berg balance score >/= 50/56   Time 4   Period Weeks   Status Achieved  56/56   PT SHORT TERM GOAL #4   Title pain with laying down decreased >/= 25%   Time 4   Status Achieved           PT Long Term Goals - 03/18/15 1411    PT LONG TERM GOAL #1   Title understand tips to avoid falls   Time 8   Period Weeks   Status Achieved   PT LONG TERM GOAL #2   Title pain with laying down decreased >/= 75%   Time 8   Period Weeks   Status Achieved   PT LONG TERM GOAL #3   Title Berg balance score >/= 54/56   Time 8   Period Weeks   Status Achieved  56/56/   PT LONG TERM GOAL #4   Title independent with balance and back strengthening exercises   Time 8   Period Weeks   Status Achieved               Plan - 03/18/15 1430    Clinical Impression Statement Patient has full lumbar ROM.  Berg Balance has improved to 56/56.  Patient is independent with HEP. Patient has met all of his LTG's. Patient is ready for discharge.    Pt will benefit from skilled therapeutic intervention in order to improve on the following deficits Decreased activity tolerance;Decreased balance;Impaired flexibility;Decreased strength;Decreased range of motion;Decreased endurance;Decreased mobility;Increased muscle spasms;Pain;Improper body  mechanics   Rehab Potential Good   Clinical Impairments Affecting Rehab Potential None   PT Frequency 2x / week   PT  Duration 4 weeks   PT Treatment/Interventions ADLs/Self Care Home Management;Electrical Stimulation;Cryotherapy;Ultrasound;Traction;Moist Heat;Functional mobility training;Therapeutic activities;Therapeutic exercise;Balance training;Neuromuscular re-education;Manual techniques;Patient/family education;Passive range of motion   PT Next Visit Plan discharge to [place] current HEP   Consulted and Agree with Plan of Care Patient          G-Codes - March 28, 2015 1415    Functional Assessment Tool Used FOTO is 12% limitation but patient is not limted by his back, his limitation is due to his breathing and being on oxygen   Functional Limitation Other PT primary   Other PT Primary Goal Status (H2379) At least 1 percent but less than 20 percent impaired, limited or restricted   Other PT Primary Discharge Status (J0940) 0 percent impaired, limited or restricted      Problem List Patient Active Problem List   Diagnosis Date Noted  . Chronic respiratory failure with hypoxia 01/12/2015  . COPD with acute exacerbation 01/21/2013  . Hyperlipidemia 12/01/2011  . Essential hypertension 12/01/2011  . CAD (coronary artery disease) 05/23/2011  . PULMONARY NODULE 09/16/2008  . ALLERGIC RHINITIS 07/24/2007  . COPD with emphysema 07/24/2007  . BURSITIS 07/24/2007    Allen Egerton,PT March 28, 2015, 2:47 PM  [facility] [address], [place], [place], [address] Phone: [phone]   Fax:  [phone]  PHYSICAL THERAPY DISCHARGE SUMMARY  Visits from Start of Care: 8  Current functional level related to goals / functional outcomes: See above.   Remaining deficits: See above   Education / Equipment: HEP  Plan: Patient agrees to discharge.  Patient goals were met. Patient is being discharged due to  meeting the stated rehab goals.  Thank you for the referral. Earlie Counts, PT 03-28-15 2:47 PM  ?????

## 2015-03-18 NOTE — Patient Instructions (Addendum)
Fall Prevention and Home Safety Falls cause injuries and can affect all age groups. It is possible to use preventive measures to significantly decrease the likelihood of falls. There are many simple measures which can make your home safer and prevent falls. OUTDOORS  Repair cracks and edges of walkways and driveways.  Remove high doorway thresholds.  Trim shrubbery on the main path into your home.  Have good outside lighting.  Clear walkways of tools, rocks, debris, and clutter.  Check that handrails are not broken and are securely fastened. Both sides of steps should have handrails.  Have leaves, snow, and ice cleared regularly.  Use sand or salt on walkways during winter months.  In the garage, clean up grease or oil spills. BATHROOM  Install night lights.  Install grab bars by the toilet and in the tub and shower.  Use non-skid mats or decals in the tub or shower.  Place a plastic non-slip stool in the shower to sit on, if needed.  Keep floors dry and clean up all water on the floor immediately.  Remove soap buildup in the tub or shower on a regular basis.  Secure bath mats with non-slip, double-sided rug tape.  Remove throw rugs and tripping hazards from the floors. BEDROOMS  Install night lights.  Make sure a bedside light is easy to reach.  Do not use oversized bedding.  Keep a telephone by your bedside.  Have a firm chair with side arms to use for getting dressed.  Remove throw rugs and tripping hazards from the floor. KITCHEN  Keep handles on pots and pans turned toward the center of the stove. Use back burners when possible.  Clean up spills quickly and allow time for drying.  Avoid walking on wet floors.  Avoid hot utensils and knives.  Position shelves so they are not too high or low.  Place commonly used objects within easy reach.  If necessary, use a sturdy step stool with a grab bar when reaching.  Keep electrical cables out of the  way.  Do not use floor polish or wax that makes floors slippery. If you must use wax, use non-skid floor wax.  Remove throw rugs and tripping hazards from the floor. STAIRWAYS  Never leave objects on stairs.  Place handrails on both sides of stairways and use them. Fix any loose handrails. Make sure handrails on both sides of the stairways are as long as the stairs.  Check carpeting to make sure it is firmly attached along stairs. Make repairs to worn or loose carpet promptly.  Avoid placing throw rugs at the top or bottom of stairways, or properly secure the rug with carpet tape to prevent slippage. Get rid of throw rugs, if possible.  Have an electrician put in a light switch at the top and bottom of the stairs. OTHER FALL PREVENTION TIPS  Wear low-heel or rubber-soled shoes that are supportive and fit well. Wear closed toe shoes.  When using a stepladder, make sure it is fully opened and both spreaders are firmly locked. Do not climb a closed stepladder.  Add color or contrast paint or tape to grab bars and handrails in your home. Place contrasting color strips on first and last steps.  Learn and use mobility aids as needed. Install an electrical emergency response system.  Turn on lights to avoid dark areas. Replace light bulbs that burn out immediately. Get light switches that glow.  Arrange furniture to create clear pathways. Keep furniture in the same place.    Firmly attach carpet with non-skid or double-sided tape.  Eliminate uneven floor surfaces.  Select a carpet pattern that does not visually hide the edge of steps.  Be aware of all pets. OTHER HOME SAFETY TIPS  Set the water temperature for 120 F (48.8 C).  Keep emergency numbers on or near the telephone.  Keep smoke detectors on every level of the home and near sleeping areas. Document Released: 07/23/2002 Document Revised: 02/01/2012 Document Reviewed: 10/22/2011 [facility] Patient Information 2015  [place], [place]. This information is not intended to replace advice given to you by your health care provider. Make sure you discuss any questions you have with your health care provider.  Tandem Stance   Right foot in front of left, heel touching toe both feet "straight ahead". Stand on Foot Triangle of Support with both feet. Balance in this position _30__ seconds. 2x then do other way Do with left foot in front of right. Can progress with closing eyes while holding onto counter.  SINGLE LIMB STANCE   Stance: single leg on floor. Raise leg. Hold _30__ seconds. Repeat with other leg. _2__ reps per set, _1__ sets per day, _1__ days per week Can hold onto counter.  Progress by closing eyes with holding onto counter.  Copyright  VHI. All rights reserved.    PNF Strengthening: Resisted   Standing with resistive band around each hand, bring right arm up and away, thumb back. Repeat _10___ times per set. Do _1___ sets per session. Do _1___ sessions per day. Do while laying down on back.      Resisted Horizontal Abduction: Bilateral   Sit or stand, tubing in both hands, arms out in front. Keeping arms straight, pinch shoulder blades together and stretch arms out. Repeat _10___ times per set. Do __1__ sets per session. Do _1___ sessions per day. Do while laying on back.    [name] [address] [place], [place] [address] Phone # [phone] Fax [phone]

## 2015-03-19 ENCOUNTER — Encounter: Payer: Medicare Other | Admitting: Physical Therapy

## 2015-03-20 ENCOUNTER — Encounter (HOSPITAL_COMMUNITY)
Admission: RE | Admit: 2015-03-20 | Discharge: 2015-03-20 | Disposition: A | Discharge status: Home / Self Care | Payer: Self-pay | Source: Ambulatory Visit | Attending: Internal Medicine | Admitting: Internal Medicine

## 2015-03-21 ENCOUNTER — Encounter: Payer: Medicare Other | Admitting: Physical Therapy

## 2015-03-24 ENCOUNTER — Encounter: Payer: Medicare Other | Admitting: Physical Therapy

## 2015-03-25 ENCOUNTER — Encounter (HOSPITAL_COMMUNITY)
Admission: RE | Admit: 2015-03-25 | Discharge: 2015-03-25 | Disposition: A | Discharge status: Home / Self Care | Payer: Self-pay | Source: Ambulatory Visit | Attending: Internal Medicine | Admitting: Internal Medicine

## 2015-03-26 ENCOUNTER — Ambulatory Visit: Payer: Medicare Other | Admitting: Physical Therapy

## 2015-03-27 ENCOUNTER — Encounter (HOSPITAL_COMMUNITY)
Admission: RE | Admit: 2015-03-27 | Discharge: 2015-03-27 | Disposition: A | Discharge status: Home / Self Care | Payer: Self-pay | Source: Ambulatory Visit | Attending: Internal Medicine | Admitting: Internal Medicine

## 2015-04-01 ENCOUNTER — Encounter (HOSPITAL_COMMUNITY)
Admission: RE | Admit: 2015-04-01 | Discharge: 2015-04-01 | Disposition: A | Discharge status: Home / Self Care | Payer: Self-pay | Source: Ambulatory Visit | Attending: Internal Medicine | Admitting: Internal Medicine

## 2015-04-03 ENCOUNTER — Encounter (HOSPITAL_COMMUNITY)
Admission: RE | Admit: 2015-04-03 | Discharge: 2015-04-03 | Disposition: A | Discharge status: Home / Self Care | Payer: Self-pay | Source: Ambulatory Visit | Attending: Internal Medicine | Admitting: Internal Medicine

## 2015-04-07 DIAGNOSIS — Z23 Encounter for immunization: Secondary | ICD-10-CM | POA: Diagnosis not present

## 2015-04-08 ENCOUNTER — Encounter (HOSPITAL_COMMUNITY)
Admission: RE | Admit: 2015-04-08 | Discharge: 2015-04-08 | Disposition: A | Discharge status: Home / Self Care | Payer: Self-pay | Source: Ambulatory Visit | Attending: Internal Medicine | Admitting: Internal Medicine

## 2015-04-10 ENCOUNTER — Encounter (HOSPITAL_COMMUNITY)
Admission: RE | Admit: 2015-04-10 | Discharge: 2015-04-10 | Disposition: A | Discharge status: Home / Self Care | Payer: Self-pay | Source: Ambulatory Visit | Attending: Internal Medicine | Admitting: Internal Medicine

## 2015-04-15 ENCOUNTER — Encounter (HOSPITAL_COMMUNITY)
Admission: RE | Admit: 2015-04-15 | Discharge: 2015-04-15 | Disposition: A | Discharge status: Home / Self Care | Payer: Self-pay | Source: Ambulatory Visit | Attending: Internal Medicine | Admitting: Internal Medicine

## 2015-04-17 ENCOUNTER — Encounter (HOSPITAL_COMMUNITY): Payer: Medicare Other

## 2015-04-17 DIAGNOSIS — J984 Other disorders of lung: Secondary | ICD-10-CM | POA: Insufficient documentation

## 2015-04-17 DIAGNOSIS — J449 Chronic obstructive pulmonary disease, unspecified: Secondary | ICD-10-CM | POA: Insufficient documentation

## 2015-04-17 DIAGNOSIS — R0602 Shortness of breath: Secondary | ICD-10-CM | POA: Insufficient documentation

## 2015-04-17 DIAGNOSIS — Z5189 Encounter for other specified aftercare: Secondary | ICD-10-CM | POA: Insufficient documentation

## 2015-04-22 ENCOUNTER — Encounter (HOSPITAL_COMMUNITY)
Admission: RE | Admit: 2015-04-22 | Discharge: 2015-04-22 | Disposition: A | Discharge status: Home / Self Care | Payer: Self-pay | Source: Ambulatory Visit | Attending: Internal Medicine | Admitting: Internal Medicine

## 2015-04-24 ENCOUNTER — Encounter (HOSPITAL_COMMUNITY)
Admission: RE | Admit: 2015-04-24 | Discharge: 2015-04-24 | Disposition: A | Discharge status: Home / Self Care | Payer: Self-pay | Source: Ambulatory Visit | Attending: Internal Medicine | Admitting: Internal Medicine

## 2015-04-24 DIAGNOSIS — I471 Supraventricular tachycardia: Secondary | ICD-10-CM | POA: Diagnosis not present

## 2015-04-24 DIAGNOSIS — R609 Edema, unspecified: Secondary | ICD-10-CM | POA: Diagnosis not present

## 2015-04-24 DIAGNOSIS — I25119 Atherosclerotic heart disease of native coronary artery with unspecified angina pectoris: Secondary | ICD-10-CM | POA: Diagnosis not present

## 2015-04-24 DIAGNOSIS — J449 Chronic obstructive pulmonary disease, unspecified: Secondary | ICD-10-CM | POA: Diagnosis not present

## 2015-04-24 DIAGNOSIS — I10 Essential (primary) hypertension: Secondary | ICD-10-CM | POA: Diagnosis not present

## 2015-04-24 DIAGNOSIS — G629 Polyneuropathy, unspecified: Secondary | ICD-10-CM | POA: Diagnosis not present

## 2015-04-24 DIAGNOSIS — N401 Enlarged prostate with lower urinary tract symptoms: Secondary | ICD-10-CM | POA: Diagnosis not present

## 2015-04-29 ENCOUNTER — Encounter (HOSPITAL_COMMUNITY)
Admission: RE | Admit: 2015-04-29 | Discharge: 2015-04-29 | Disposition: A | Discharge status: Home / Self Care | Payer: Self-pay | Source: Ambulatory Visit | Attending: Internal Medicine | Admitting: Internal Medicine

## 2015-05-01 ENCOUNTER — Encounter (HOSPITAL_COMMUNITY): Payer: Self-pay

## 2015-05-06 ENCOUNTER — Encounter (HOSPITAL_COMMUNITY): Payer: Self-pay

## 2015-05-08 ENCOUNTER — Encounter (HOSPITAL_COMMUNITY): Payer: Self-pay

## 2015-05-13 ENCOUNTER — Encounter (HOSPITAL_COMMUNITY)
Admission: RE | Admit: 2015-05-13 | Discharge: 2015-05-13 | Disposition: A | Discharge status: Home / Self Care | Payer: Self-pay | Source: Ambulatory Visit | Attending: Internal Medicine | Admitting: Internal Medicine

## 2015-05-15 ENCOUNTER — Encounter (HOSPITAL_COMMUNITY)
Admission: RE | Admit: 2015-05-15 | Discharge: 2015-05-15 | Disposition: A | Discharge status: Home / Self Care | Payer: Self-pay | Source: Ambulatory Visit | Attending: Internal Medicine | Admitting: Internal Medicine

## 2015-05-20 ENCOUNTER — Encounter (HOSPITAL_COMMUNITY)
Admission: RE | Admit: 2015-05-20 | Discharge: 2015-05-20 | Disposition: A | Discharge status: Home / Self Care | Payer: Self-pay | Source: Ambulatory Visit | Attending: Internal Medicine | Admitting: Internal Medicine

## 2015-05-20 DIAGNOSIS — Z5189 Encounter for other specified aftercare: Secondary | ICD-10-CM | POA: Insufficient documentation

## 2015-05-20 DIAGNOSIS — J449 Chronic obstructive pulmonary disease, unspecified: Secondary | ICD-10-CM | POA: Insufficient documentation

## 2015-05-20 DIAGNOSIS — J984 Other disorders of lung: Secondary | ICD-10-CM | POA: Insufficient documentation

## 2015-05-20 DIAGNOSIS — R0602 Shortness of breath: Secondary | ICD-10-CM | POA: Insufficient documentation

## 2015-05-22 ENCOUNTER — Encounter (HOSPITAL_COMMUNITY)
Admission: RE | Admit: 2015-05-22 | Discharge: 2015-05-22 | Disposition: A | Discharge status: Home / Self Care | Payer: Self-pay | Source: Ambulatory Visit | Attending: Internal Medicine | Admitting: Internal Medicine

## 2015-05-27 ENCOUNTER — Ambulatory Visit (INDEPENDENT_AMBULATORY_CARE_PROVIDER_SITE_OTHER): Payer: Medicare Other | Admitting: Cardiovascular Disease

## 2015-05-27 ENCOUNTER — Encounter (HOSPITAL_COMMUNITY)
Admission: RE | Admit: 2015-05-27 | Discharge: 2015-05-27 | Disposition: A | Discharge status: Home / Self Care | Payer: Self-pay | Source: Ambulatory Visit | Attending: Internal Medicine | Admitting: Internal Medicine

## 2015-05-27 VITALS — BP 128/72 | HR 78 | Ht 69.0 in | Wt 168.1 lb

## 2015-05-27 DIAGNOSIS — I10 Essential (primary) hypertension: Secondary | ICD-10-CM | POA: Diagnosis not present

## 2015-05-27 DIAGNOSIS — I2510 Atherosclerotic heart disease of native coronary artery without angina pectoris: Secondary | ICD-10-CM | POA: Diagnosis not present

## 2015-05-27 DIAGNOSIS — E785 Hyperlipidemia, unspecified: Secondary | ICD-10-CM | POA: Diagnosis not present

## 2015-05-27 MED ORDER — ASPIRIN EC 81 MG PO TBEC: 81.0000 mg | DELAYED_RELEASE_TABLET | Freq: Every day | ORAL | Status: DC

## 2015-05-27 NOTE — Patient Instructions (Signed)
Medication Instructions:  Your physician has recommended you make the following change in your medication:  1. START Aspirin 81mg  take one tablet by mouth daily  Labwork: No new orders.   Testing/Procedures: No new orders.   Follow-Up: Your physician wants you to follow-up in: 1 YEAR with Dr Burt Knack.  You will receive a reminder letter in the mail two months in advance. If you don't receive a letter, please call our office to schedule the follow-up appointment.   Any Other Special Instructions Will Be Listed Below (If Applicable).

## 2015-05-27 NOTE — Progress Notes (Signed)
Cardiology Office Note Date:  05/29/2015   ID:  [name], DOB 1934-12-29, MRN [id number]  PCP:  Gennette Pac, MD  Cardiologist:  Sherren Mocha, MD    Chief Complaint  Patient presents with  . Shortness of Breath    History of Present Illness: [name] is a 80 y.o. male who presents for follow-up evaluation. He has been diagnosed with subclinical atherosclerosis based on CT imaging of the chest. Coronary calcifications were noted. There is also mild to moderate atherosclerotic calcification of the aorta. He underwent cardiac catheterization in 2003 that demonstrated nonobstructive coronary disease with the exception of an 80% ostial stenosis of a small ramus intermedius branch. His left ventricular function has been normal.  The patient has advanced chronic lung disease on home O2, with slowly progressive dyspnea. He denies chest pain, pressure, or palpitations. Also complains of chronic leg swelling.   Past Medical History  Diagnosis Date  . COPD (chronic obstructive pulmonary disease) ([place])   . Allergic rhinitis, cause unspecified   . Pulmonary nodule   . Emphysema of lung [facility])     Past Surgical History  Procedure Laterality Date  . Tonsillectomy    . Appendectomy    . Rotator cuff repair    . Vasectomy      Current Outpatient Prescriptions  Medication Sig Dispense Refill  . albuterol (PROAIR HFA) 108 (90 BASE) MCG/ACT inhaler Inhale 2 puffs into the lungs 4 (four) times daily as needed for wheezing or shortness of breath. 1 Inhaler 5  . albuterol (PROVENTIL) (2.5 MG/3ML) 0.083% nebulizer solution Take 2.5 mg by nebulization every 6 (six) hours as needed for wheezing or shortness of breath.    . ALPRAZolam (XANAX) 0.5 MG tablet Take 0.5 mg by mouth as needed.    [name] amLODipine (NORVASC) 2.5 MG tablet TK 1 T PO QD  3  . budesonide-formoterol (SYMBICORT) 160-4.5 MCG/ACT inhaler Inhale 2 puffs into the lungs 2 (two) times daily. 1 Inhaler prn  .  digoxin (LANOXIN) 0.25 MG tablet Take 250 mcg by mouth daily.      [name] losartan (COZAAR) 50 MG tablet Take 1 tablet by mouth Daily.    . pravastatin (PRAVACHOL) 40 MG tablet Take 1 tablet by mouth daily.    . promethazine-codeine (PHENERGAN WITH CODEINE) 6.25-10 MG/5ML syrup Take 5 mLs by mouth every 6 (six) hours as needed for cough. 200 mL 0  . Spacer/Aero-Holding Chambers (AEROCHAMBER MV) inhaler Use as instructed 1 each 0  . Tiotropium Bromide Monohydrate (SPIRIVA RESPIMAT) 2.5 MCG/ACT AERS 2 puffs once daily 2 Inhaler 0  . aspirin EC 81 MG tablet Take 1 tablet (81 mg total) by mouth daily.    . furosemide (LASIX) 20 MG tablet Take 1 tablet (20 mg total) by mouth daily as needed (swelling). 30 tablet 3   No current facility-administered medications for this visit.    Allergies:   Tolectin and Montelukast sodium   Social History:  The patient  reports that he quit smoking about 24 years ago. He has never used smokeless tobacco.   Family History:  The patient's  family history includes COPD in his mother and sister; Coronary artery disease in an other family member; Stroke in his father and mother.    ROS:  Please see the history of present illness.  Otherwise, review of systems is positive for shortness of breath, leg swelling, orthopnea, back pain, muscle pain, easy bruising, wheezing.  All other systems are reviewed and negative.  PHYSICAL EXAM: VS:  BP 128/72 mmHg  Pulse 78  Ht 5\' 9"  (1.753 m)  Wt 168 lb 1.9 oz (76.259 kg)  BMI 24.82 kg/m2 , BMI Body mass index is 24.82 kg/(m^2). GEN: Well nourished, well developed, in no acute distress HEENT: normal Neck: no JVD, no masses. No carotid bruits Cardiac: RRR without murmur or gallop                Respiratory:  clear to auscultation bilaterally, normal work of breathing, distant lung sounds GI: soft, nontender, nondistended, + BS MS: no deformity or atrophy Ext: 1+ pretibial edema, pedal pulses 2+= bilaterally Skin: warm and  dry, no rash Neuro:  Strength and sensation are intact Psych: euthymic mood, full affect  EKG:  EKG is ordered today. The ekg ordered today shows NSR 78 bpm, RAD, low voltage  Recent Labs: No results found for requested labs within last 365 days.   Lipid Panel  No results found for: CHOL, TRIG, HDL, CHOLHDL, VLDL, LDLCALC, LDLDIRECT    Wt Readings from Last 3 Encounters:  05/27/15 168 lb 1.9 oz (76.259 kg)  12/19/14 171 lb 6.4 oz (77.747 kg)  12/05/14 171 lb 12.8 oz (77.928 kg)     Cardiac Studies Reviewed: 2d Echo May 2015: Left ventricle: The cavity size was normal. Wall thickness was increased in a pattern of mild LVH. Systolic function was normal. The estimated ejection fraction was in the range of 50% to 55%. Wall motion was normal; there were no regional wall motion abnormalities.  ------------------------------------------------------------------- Aortic valve:  Mildly calcified leaflets. Cusp separation was normal. Doppler: Transvalvular velocity was within the normal range. There was no stenosis. There was no regurgitation.  ------------------------------------------------------------------- Aorta: The aorta was normal, not dilated, and non-diseased.  ------------------------------------------------------------------- Mitral valve:  Structurally normal valve.  Leaflet separation was normal. Doppler: Transvalvular velocity was within the normal range. There was no evidence for stenosis. There was no regurgitation.  ------------------------------------------------------------------- Left atrium: The atrium was normal in size.  ------------------------------------------------------------------- Right ventricle: The cavity size was mildly to moderately dilated. Wall thickness was normal. Systolic function was normal.  ------------------------------------------------------------------- Pulmonic valve:  Poorly visualized. Structurally normal  valve. Cusp separation was normal. Doppler: Transvalvular velocity was within the normal range. There was no regurgitation.  ------------------------------------------------------------------- Tricuspid valve:  Structurally normal valve.  Leaflet separation was normal. Doppler: Transvalvular velocity was within the normal range. There was mild regurgitation.  ------------------------------------------------------------------- Pulmonary artery:  The main pulmonary artery was normal-sized.  ------------------------------------------------------------------- Right atrium: The atrium was mildly dilated.  ------------------------------------------------------------------- Pericardium: The pericardium was normal in appearance. There was no pericardial effusion.  ------------------------------------------------------------------- Systemic veins: Inferior vena cava: The vessel was normal in size. The respirophasic diameter changes were in the normal range (= 50%), consistent with normal central venous pressure.  ------------------------------------------------------------------- Post procedure conclusions Ascending Aorta:  - The aorta was normal, not dilated, and non-diseased.   ASSESSMENT AND PLAN: 1.  CAD, native vessel, without angina. We discussed medical therapy and I advised that he resume ASA 81 mg daily.   2. Edema: likely related to stasis, amlodipine, and RV dysfunction in setting chronic lung disease. Discussed considerations of compression stockings but he declined. Will also offer low dose furosemide.   3. Remote SVT: no recurrence.  4. HTN: BP controlled.  Current medicines are reviewed with the patient today.  The patient does not have concerns regarding medicines.  Labs/ tests ordered today include:   Orders Placed This Encounter  Procedures  . EKG 12-Lead  Disposition:   FU one year  Signed, Sherren Mocha, MD  05/29/2015 6:06 PM    [facility] [place], [place], [place]  [address] Phone: [phone]; Fax: [phone]

## 2015-05-29 ENCOUNTER — Encounter (HOSPITAL_COMMUNITY)
Admission: RE | Admit: 2015-05-29 | Discharge: 2015-05-29 | Disposition: A | Discharge status: Home / Self Care | Payer: Self-pay | Source: Ambulatory Visit | Attending: Internal Medicine | Admitting: Internal Medicine

## 2015-05-29 ENCOUNTER — Telehealth: Payer: Self-pay

## 2015-05-29 MED ORDER — FUROSEMIDE 20 MG PO TABS: 20.0000 mg | ORAL_TABLET | Freq: Every day | ORAL | Status: DC | PRN

## 2015-05-29 NOTE — Telephone Encounter (Signed)
Per Dr Burt Knack:  [name] - will you touch base with him and see if he would like an Rx for lasix 20 mg as needed for swelling? May help his leg edema with minimal side effect. thx   I left the pt a message to contact our office.

## 2015-05-29 NOTE — Telephone Encounter (Signed)
Follow up  ° ° °Patient returning call back to nurse  °

## 2015-05-29 NOTE — Telephone Encounter (Signed)
I spoke with the pt and made him aware of Dr Antionette Char recommendation.  Rx sent to the pharmacy and the pt will try this as needed for swelling.

## 2015-06-03 ENCOUNTER — Encounter (HOSPITAL_COMMUNITY)
Admission: RE | Admit: 2015-06-03 | Discharge: 2015-06-03 | Disposition: A | Discharge status: Home / Self Care | Payer: Self-pay | Source: Ambulatory Visit | Attending: Internal Medicine | Admitting: Internal Medicine

## 2015-06-05 ENCOUNTER — Encounter (HOSPITAL_COMMUNITY): Payer: Self-pay

## 2015-06-10 ENCOUNTER — Encounter (HOSPITAL_COMMUNITY)
Admission: RE | Admit: 2015-06-10 | Discharge: 2015-06-10 | Disposition: A | Discharge status: Home / Self Care | Payer: Self-pay | Source: Ambulatory Visit | Attending: Internal Medicine | Admitting: Internal Medicine

## 2015-06-11 ENCOUNTER — Ambulatory Visit: Payer: Medicare Other | Admitting: Cardiovascular Disease

## 2015-06-12 ENCOUNTER — Encounter (HOSPITAL_COMMUNITY)
Admission: RE | Admit: 2015-06-12 | Discharge: 2015-06-12 | Disposition: A | Discharge status: Home / Self Care | Payer: Self-pay | Source: Ambulatory Visit | Attending: Internal Medicine | Admitting: Internal Medicine

## 2015-06-17 ENCOUNTER — Encounter (HOSPITAL_COMMUNITY)
Admission: RE | Admit: 2015-06-17 | Discharge: 2015-06-17 | Disposition: A | Discharge status: Home / Self Care | Payer: Self-pay | Source: Ambulatory Visit | Attending: Internal Medicine | Admitting: Internal Medicine

## 2015-06-17 DIAGNOSIS — R0602 Shortness of breath: Secondary | ICD-10-CM | POA: Insufficient documentation

## 2015-06-17 DIAGNOSIS — J984 Other disorders of lung: Secondary | ICD-10-CM | POA: Insufficient documentation

## 2015-06-17 DIAGNOSIS — J449 Chronic obstructive pulmonary disease, unspecified: Secondary | ICD-10-CM | POA: Insufficient documentation

## 2015-06-17 DIAGNOSIS — Z5189 Encounter for other specified aftercare: Secondary | ICD-10-CM | POA: Insufficient documentation

## 2015-06-19 ENCOUNTER — Encounter (HOSPITAL_COMMUNITY)
Admission: RE | Admit: 2015-06-19 | Discharge: 2015-06-19 | Disposition: A | Discharge status: Home / Self Care | Payer: Self-pay | Source: Ambulatory Visit | Attending: Internal Medicine | Admitting: Internal Medicine

## 2015-06-19 ENCOUNTER — Ambulatory Visit (INDEPENDENT_AMBULATORY_CARE_PROVIDER_SITE_OTHER): Payer: Medicare Other | Admitting: Internal Medicine

## 2015-06-19 ENCOUNTER — Encounter: Payer: Self-pay | Admitting: Internal Medicine

## 2015-06-19 VITALS — BP 120/58 | HR 96 | Ht 69.0 in | Wt 169.0 lb

## 2015-06-19 DIAGNOSIS — J9611 Chronic respiratory failure with hypoxia: Secondary | ICD-10-CM | POA: Diagnosis not present

## 2015-06-19 DIAGNOSIS — J432 Centrilobular emphysema: Secondary | ICD-10-CM

## 2015-06-19 DIAGNOSIS — I2510 Atherosclerotic heart disease of native coronary artery without angina pectoris: Secondary | ICD-10-CM

## 2015-06-19 MED ORDER — GLYCOPYRROLATE-FORMOTEROL 9-4.8 MCG/ACT IN AERO
2.0000 | INHALATION_SPRAY | Freq: Every day | RESPIRATORY_TRACT | Status: DC
Start: 2015-06-19 — End: 2015-10-23

## 2015-06-19 NOTE — Patient Instructions (Addendum)
Try using your nebulizer machine once daily in the morning for the next 3 or 4 days, in addition to continuing Symbicort and Spiriva  THEN- Instead of all of those meds, try sample Bevespi inhaler   2 puffs, twice daily  Call if either strategy seems better than what you are doing now with Symbicort and Spiriva

## 2015-06-19 NOTE — Progress Notes (Signed)
Patient ID: [name], male    DOB: 06-13-1935, 80 y.o.   MRN: [id number] PCP Dr Hulan Fess HPI 01/25/11- 24 yo M former smoker followed for COPD/ emphysema with recent concern for pulmonary nodules. Last here May 20, 2010- note reviewed. CT done during the winter showed nodules.  PET positive nodule Left lung faded before needle bx. He had gone to [facility] for second opinion w/ Dr [name] where severity of his advanced lung disease was emphasized.  lung volume reduction and transplant were mentioned although I don't know how seriously, given his age.[name] He comes today to update and to have conference including his wife:  Conference on overall status: He had concern about amount of radiation from imaging and I reminded him that is why we chose to do CXR 05/20/10 which showed stable COPD. PFT 07/01/05 - FEV1 1.13/ 0.39; FEV1/FVC 0.25; DLCO 0.52; insignif response to bronchodilator. PFT at [facility] 11/26/10- FEV1 0.82/ 0.25; FEV1/FVC 0.22 Quality of life and prognosis discussed. He has noted some decline over past year. He could walk through [employer] without O2 then and no longer can. Emphysema pattern with little cough, wheeze, phlegm. No chest pain, palpitation or ankle edema. After long discussion we agreed to an inquiry to [facility] Transplant coordinator for consideration. I told him his age made acceptance unlikely. We discussed limited role of meds,He has had Pneumovax, annual flu vax, Pulmonary Rehab, and test normal for a1AT in the past. Has O2 2l/m for sleep and exertion, home nebulizer. He asks another script for prednisone to hold and we discussed side effects.   05/20/11 80 yo M former smoker followed for COPD/ emphysema with recent concern for pulmonary nodules. .. wife here He and his wife come today for an extended review of his early experience with [facility] lung transplant team evaluation. PFT- [facility] 04/27/11- FEV1 0.78/23%, FEV1/FVC 0.27, TLC 98%, RV 163%, DLCO 37%. ABG-room air-04/27/2011  pH 7.42, PCO2 40, PO2 63, bicarbonate 26, O2 saturation 91.9% 6 minute walk test 04/27/2011: 314.6 m (60% to distance predicted) with significant dyspnea while wearing oxygen at 3 L. He came away with significant reservations about the prospects for transplant. He asked for my estimate of his life expectancy without transplant. I suggested that barring an intervening acute illness, an additional 5 years is possible. He understands this is a very loose estimate. I deferred questions about hepatitis B vaccination and testosterone replacement to his primary physician. He wanted to know what else we might do to test and treat him if he chose not to seek transplant. We will watch his lung nodules with chest x-ray and/or CT, but our ability to intervene would be limited. If he does not have transplant, I don't know that cardiac stress test or repeat pulmonary function tests would add much useful  information unless symptoms change.  10/20/11- 26 yo M former smoker followed for COPD/ emphysema with recent concern for pulmonary nodules [facility] CXR 04/27/11- COPD with no mention of nodules. Followup at [facility] pulmonary has been deferred until September. They want stress test. He is leaning away from transplant at this time of blood and he wanted my thoughts. Discussed his left upper lobe nodule, atherosclerosis on CT scan in April of 2012. He continues pulmonary rehabilitation. Oxygen saturation on 3 L is 96% during exercise. He feels his current life quality is okay. Denies chest pain, acute discomfort, edema. Little cough or phlegm.  05/04/12-  41 yo M former smoker followed for COPD/ emphysema with  recent concern for pulmonary nodules [facility] CXR 04/27/11- COPD with no mention of nodules. Sob same, cough yellow to clear, no wheezing, no fcs He talked about another meeting with the [place] transplant doctors. At this point he is less interested in lung transplant as an option. Echocardiogram 12/16/2011 showed EF 65-70%, no  pulmonary hypertension, grade 1 diastolic dysfunction. [name] increased nocturia. Oxygen is used with sleep and exertion  11/02/12- 35 yo M former smoker followed for COPD/ emphysema with recent concern for pulmonary nodules FOLLOWS FOR: states breathing is about the same since last visit; would like [name] inhaler sample ( his current insurance will cover); also needs refill for [facility] HFA. Stable through the winter with no acute infection. Remains on oxygen now, especially with exertion. Has used Spiriva but would like to try [place] again. a1AT normal MM. CT 05/19/12- images reviewed again with him IMPRESSION:  1. Left upper lobe nodule is stable from 12/11/2010. Additional  follow-up in one year is recommended to document 2 years of  stability. This recommendation follows the consensus statement:  Guidelines for Management of Small Pulmonary Nodules Detected on CT  Scans: A Statement from the [employer] as published in  Radiology 2005; 237:395-400.  2. Additional scattered pulmonary nodules are stable and therefore  considered benign.  3. Previously seen left upper lobe subpleural airspace  consolidation has resolved in the interval.  Original Report Authenticated By: [name], M.D.  12/15/12- 28 yo M former smoker followed for COPD/ emphysema with recent concern for pulmonary nodules FOLLOWS FOR: pt reports x 2wks had a scratchy throat, nonprod cough, fatigue -- statrted taking mucinex x3days seems to help some [name] at pulmonary rehabilitation sent him here. O2 3L/ [employer]  01/11/13 80 yo M former smoker followed for COPD/ emphysema with recent concern for pulmonary nodules   Wife here ACUTE VISIT: fevers, increased SOB, no energy, slight sore throat (not bad), cough-mostly dry.Pain in left side-unsure if from cough. At beach last week. Feels ill. May have gotten chills. Now fever x5 days, 101-102. Increased his oxygen to 4 L. Short of breath, no energy, mild sore  throat, dry cough, tussive left chest pain. No dysuria or nausea. Saw PCP on Tuesday, suspected viral infection.  06/05/13- 17 yo M former smoker followed for COPD/ emphysema, pulmonary nodules        FOLLOWS FOR:  Breathing is unchanged since last OV.  Discuss CT scan No signif  Change. Slow recovery from pneumonia- xrays reviewed.Little cough, no phlegm. Trying Breo.  Ankles swell some. No chest pain or palpitation. O2 2-3 L/ [facility] CT 05/17/13  Also notes atherosclerosis aorta IMPRESSION:  1. Stable uptake stress set left upper lobe nodule is stable in size  from previous exam. Interval scarring and architectural distortion  within this area is likely the sequelae of inflammation or  infection.  2. The other previously referenced nodules are unchanged from  previous exam and are most likely benign.  3. Emphysema.  Electronically Signed  By: Kerby Moors M.D.  On: 05/17/2013 12:04  11/12/13- 89 yo M former smoker followed for COPD/ emphysema, pulmonary nodules  ACUTE VISIT:  Fever and congestion with cough and brown mucus.  Also, has wheezing  wife here More short of breath over the weekend with stuffy nose. Temperature 100.1 last night. Started Z-Pak yesterday. Much cough productive brown. More short of breath, no energy, malaise.   12/13/13- 32 yo M former smoker followed for COPD/ emphysema, pulmonary nodules  FOLLOWS FOR:  Breathing  improved since last OV.  Still having sob with exertion Comfortable off of oxygen while sitting quietly but needs oxygen at 3 L/ [employer] for any exertion and for sleep. CBC w/ diff 11/12/13- WBC 10,600, hemoglobin 13.6, 85% neutrophils CXR 11/12/13 IMPRESSION:  No evidence of acute cardiopulmonary disease.  Electronically Signed  By: Julian Hy M.D.  On: 11/12/2013 14:00  06/13/14- 20 yo M former smoker followed for COPD/ emphysema, pulmonary nodules , complicated by CAD, hx lung nodules FOLLOWS FOR: patient states trouble with breathing  worsening last 3 days.  Patient has had wheezing; he denies chest tightness.  12/05/14- 68 yo M former smoker followed for COPD/ emphysema, pulmonary nodules , complicated by CAD, hx lung nodules ACUTE VISIT: Increased congestion; "got sick last week; thought it was the flu"-upset stomach. That cleared up and started having congestion; currently on Prednisone was told to see CY if no better. Has had Zpak and pred taper he had at home..  12/19/14- 87 yo M former smoker followed for COPD/ emphysema, pulmonary nodules , complicated by CAD, hx lung nodules FOLLOWS HFW:YOVZCHY better, sob better,fell at home 4-5 days ago-bruised on oxygen tubing,sore in back,no cough,no wheeze. We gave Stiolto/ Pulmicort- he has not tried yet. O2 3-5 liters/[facility] Tripped/ fell hurting back- PCP treating, affects sleep. CXR 12/05/14 IMPRESSION: COPD without acute abnormality. Electronically Signed  By: Inez Catalina M.D.  On: 12/05/2014 10:41    06/19/15-79 yo M former smoker followed for COPD/ emphysema, pulmonary nodules, hypxoic resp failure , complicated by CAD,  O2 3 L/[facility] FOLLOWS FOR: Pt states he went back to Symbicort and Spiriva because they helped more than the stiolto and pulmicort. Pt states he feels he is more SOB than his normal, also increase in prod cough with clear to yellow mucus. Pt denies CP/tightness.  Has had flu vaccine Gradually more dyspnea on exertion and more need for continuous oxygen use over the last year. Some increased cough with thick clear sputum. Denies adenopathy, chest pain, palpitation, swelling feet, blood.  Review of Systems-see HPI Constitutional:   No-   weight loss, night sweats fevers, no-chills, +fatigue, lassitude. HEENT:   No-  headaches, difficulty swallowing, tooth/dental problems, +sore throat,       No-  sneezing, itching, ear ache, nasal congestion, post nasal drip,  CV:  No-chest pain, no-orthopnea, PND, No-swelling in lower extremities, anasarca, dizziness,  palpitations Resp: + shortness of breath with exertion, not at rest.              + cough,  + non-productive cough,  No-  coughing up of blood.             No-change in color of mucus.  No- wheezing.   Skin: No-   rash or lesions. GI:  No-   heartburn, indigestion, abdominal pain, nausea,   GU: No-   dysuria,  MS:  No-   joint pain or swelling.  Neuro-  Psych:  No- change in mood or affect. No acute depression or anxiety.  No memory loss.  OBJ- General- Alert, Oriented, Affect-appropriate, Distress- none acute. O2 3L 100% at rest.  Skin- rash-none, lesions- none, excoriation- none Lymphadenopathy- none Head- atraumatic            Eyes- Gross vision intact, PERRLA, conjunctivae clear secretions            Ears- Hearing, canals-normal            Nose- Clear, no-Septal dev, mucus, polyps, erosion, perforation  Throat- Mallampati II , mucosa clear , drainage- none, tonsils- atrophic.  Neck- flexible , trachea midline, no stridor , thyroid nl, carotid no bruit Chest - symmetrical excursion , unlabored           Heart/CV- RRR , no murmur , no gallop  , no rub, nl s1 s2                           - JVD- none , edema-none, stasis changes- none, varices- none           Lung-  + very distant sounds/ clear, unlabored, cough+-none , dullness-none, rub- none           Chest wall-  Abd-  Br/ Gen/ Rectal- Not done, not indicated Extrem- cyanosis- none, clubbing, none, atrophy- none, strength- nl.  Neuro- grossly intact to observation

## 2015-06-20 NOTE — Assessment & Plan Note (Signed)
More continuous use of oxygen without acute event

## 2015-06-20 NOTE — Assessment & Plan Note (Signed)
Probably slow normal progression of his COPD. No additional disease process identified Plan-try using nebulizer machine each morning for 3 days to see if that makes a difference. Then try using sample LABA/LAMA Bevespi inhaler as discussed

## 2015-06-24 ENCOUNTER — Encounter (HOSPITAL_COMMUNITY)
Admission: RE | Admit: 2015-06-24 | Discharge: 2015-06-24 | Disposition: A | Discharge status: Home / Self Care | Payer: Self-pay | Source: Ambulatory Visit | Attending: Internal Medicine | Admitting: Internal Medicine

## 2015-06-26 ENCOUNTER — Encounter (HOSPITAL_COMMUNITY): Payer: Self-pay

## 2015-06-30 ENCOUNTER — Encounter: Payer: Self-pay | Admitting: Internal Medicine

## 2015-06-30 ENCOUNTER — Ambulatory Visit (INDEPENDENT_AMBULATORY_CARE_PROVIDER_SITE_OTHER): Payer: Medicare Other | Admitting: Internal Medicine

## 2015-06-30 VITALS — BP 132/62 | HR 78 | Ht 69.0 in | Wt 179.6 lb

## 2015-06-30 DIAGNOSIS — J441 Chronic obstructive pulmonary disease with (acute) exacerbation: Secondary | ICD-10-CM

## 2015-06-30 DIAGNOSIS — J9611 Chronic respiratory failure with hypoxia: Secondary | ICD-10-CM

## 2015-06-30 DIAGNOSIS — I2510 Atherosclerotic heart disease of native coronary artery without angina pectoris: Secondary | ICD-10-CM | POA: Diagnosis not present

## 2015-06-30 MED ORDER — AZITHROMYCIN 250 MG PO TABS: ORAL_TABLET | ORAL | Status: DC

## 2015-06-30 MED ORDER — PREDNISONE 10 MG PO TABS: ORAL_TABLET | ORAL | Status: DC

## 2015-06-30 NOTE — Patient Instructions (Addendum)
Scripts sent for Z pak and prednisone taper  Because Zpak may interact with Digoxin to raise Dig blood level, suggest you skip a dose or two of Digoxin while on Zpak  Fluids, comfort meds, avoid chills and exhaustion  Keep scheduled appointment, but please call sooner as needed

## 2015-06-30 NOTE — Assessment & Plan Note (Signed)
Acute bronchitis Plan-Z-Pak, prednisone taper, fluids

## 2015-06-30 NOTE — Progress Notes (Signed)
Patient ID: [name], male    DOB: 06-13-1935, 80 y.o.   MRN: [id number] PCP Dr Hulan Fess HPI 01/25/11- 24 yo M former smoker followed for COPD/ emphysema with recent concern for pulmonary nodules. Last here May 20, 2010- note reviewed. CT done during the winter showed nodules.  PET positive nodule Left lung faded before needle bx. He had gone to [facility] for second opinion w/ Dr [name] where severity of his advanced lung disease was emphasized.  lung volume reduction and transplant were mentioned although I don't know how seriously, given his age.[name] He comes today to update and to have conference including his wife:  Conference on overall status: He had concern about amount of radiation from imaging and I reminded him that is why we chose to do CXR 05/20/10 which showed stable COPD. PFT 07/01/05 - FEV1 1.13/ 0.39; FEV1/FVC 0.25; DLCO 0.52; insignif response to bronchodilator. PFT at [facility] 11/26/10- FEV1 0.82/ 0.25; FEV1/FVC 0.22 Quality of life and prognosis discussed. He has noted some decline over past year. He could walk through [employer] without O2 then and no longer can. Emphysema pattern with little cough, wheeze, phlegm. No chest pain, palpitation or ankle edema. After long discussion we agreed to an inquiry to [facility] Transplant coordinator for consideration. I told him his age made acceptance unlikely. We discussed limited role of meds,He has had Pneumovax, annual flu vax, Pulmonary Rehab, and test normal for a1AT in the past. Has O2 2l/m for sleep and exertion, home nebulizer. He asks another script for prednisone to hold and we discussed side effects.   05/20/11 80 yo M former smoker followed for COPD/ emphysema with recent concern for pulmonary nodules. .. wife here He and his wife come today for an extended review of his early experience with [facility] lung transplant team evaluation. PFT- [facility] 04/27/11- FEV1 0.78/23%, FEV1/FVC 0.27, TLC 98%, RV 163%, DLCO 37%. ABG-room air-04/27/2011  pH 7.42, PCO2 40, PO2 63, bicarbonate 26, O2 saturation 91.9% 6 minute walk test 04/27/2011: 314.6 m (60% to distance predicted) with significant dyspnea while wearing oxygen at 3 L. He came away with significant reservations about the prospects for transplant. He asked for my estimate of his life expectancy without transplant. I suggested that barring an intervening acute illness, an additional 5 years is possible. He understands this is a very loose estimate. I deferred questions about hepatitis B vaccination and testosterone replacement to his primary physician. He wanted to know what else we might do to test and treat him if he chose not to seek transplant. We will watch his lung nodules with chest x-ray and/or CT, but our ability to intervene would be limited. If he does not have transplant, I don't know that cardiac stress test or repeat pulmonary function tests would add much useful  information unless symptoms change.  10/20/11- 26 yo M former smoker followed for COPD/ emphysema with recent concern for pulmonary nodules [facility] CXR 04/27/11- COPD with no mention of nodules. Followup at [facility] pulmonary has been deferred until September. They want stress test. He is leaning away from transplant at this time of blood and he wanted my thoughts. Discussed his left upper lobe nodule, atherosclerosis on CT scan in April of 2012. He continues pulmonary rehabilitation. Oxygen saturation on 3 L is 96% during exercise. He feels his current life quality is okay. Denies chest pain, acute discomfort, edema. Little cough or phlegm.  05/04/12-  41 yo M former smoker followed for COPD/ emphysema with  recent concern for pulmonary nodules [facility] CXR 04/27/11- COPD with no mention of nodules. Sob same, cough yellow to clear, no wheezing, no fcs He talked about another meeting with the [place] transplant doctors. At this point he is less interested in lung transplant as an option. Echocardiogram 12/16/2011 showed EF 65-70%, no  pulmonary hypertension, grade 1 diastolic dysfunction. [name] increased nocturia. Oxygen is used with sleep and exertion  11/02/12- 35 yo M former smoker followed for COPD/ emphysema with recent concern for pulmonary nodules FOLLOWS FOR: states breathing is about the same since last visit; would like [name] inhaler sample ( his current insurance will cover); also needs refill for [facility] HFA. Stable through the winter with no acute infection. Remains on oxygen now, especially with exertion. Has used Spiriva but would like to try [place] again. a1AT normal MM. CT 05/19/12- images reviewed again with him IMPRESSION:  1. Left upper lobe nodule is stable from 12/11/2010. Additional  follow-up in one year is recommended to document 2 years of  stability. This recommendation follows the consensus statement:  Guidelines for Management of Small Pulmonary Nodules Detected on CT  Scans: A Statement from the [employer] as published in  Radiology 2005; 237:395-400.  2. Additional scattered pulmonary nodules are stable and therefore  considered benign.  3. Previously seen left upper lobe subpleural airspace  consolidation has resolved in the interval.  Original Report Authenticated By: [name], M.D.  12/15/12- 28 yo M former smoker followed for COPD/ emphysema with recent concern for pulmonary nodules FOLLOWS FOR: pt reports x 2wks had a scratchy throat, nonprod cough, fatigue -- statrted taking mucinex x3days seems to help some [name] at pulmonary rehabilitation sent him here. O2 3L/ [facility]  01/11/13 80 yo M former smoker followed for COPD/ emphysema with recent concern for pulmonary nodules   Wife here ACUTE VISIT: fevers, increased SOB, no energy, slight sore throat (not bad), cough-mostly dry.Pain in left side-unsure if from cough. At beach last week. Feels ill. May have gotten chills. Now fever x5 days, 101-102. Increased his oxygen to 4 L. Short of breath, no energy, mild sore  throat, dry cough, tussive left chest pain. No dysuria or nausea. Saw PCP on Tuesday, suspected viral infection.  06/05/13- 17 yo M former smoker followed for COPD/ emphysema, pulmonary nodules        FOLLOWS FOR:  Breathing is unchanged since last OV.  Discuss CT scan No signif  Change. Slow recovery from pneumonia- xrays reviewed.Little cough, no phlegm. Trying Breo.  Ankles swell some. No chest pain or palpitation. O2 2-3 L/ [facility] CT 05/17/13  Also notes atherosclerosis aorta IMPRESSION:  1. Stable uptake stress set left upper lobe nodule is stable in size  from previous exam. Interval scarring and architectural distortion  within this area is likely the sequelae of inflammation or  infection.  2. The other previously referenced nodules are unchanged from  previous exam and are most likely benign.  3. Emphysema.  Electronically Signed  By: Kerby Moors M.D.  On: 05/17/2013 12:04  11/12/13- 89 yo M former smoker followed for COPD/ emphysema, pulmonary nodules  ACUTE VISIT:  Fever and congestion with cough and brown mucus.  Also, has wheezing  wife here More short of breath over the weekend with stuffy nose. Temperature 100.1 last night. Started Z-Pak yesterday. Much cough productive brown. More short of breath, no energy, malaise.   12/13/13- 32 yo M former smoker followed for COPD/ emphysema, pulmonary nodules  FOLLOWS FOR:  Breathing  improved since last OV.  Still having sob with exertion Comfortable off of oxygen while sitting quietly but needs oxygen at 3 L/ [facility] for any exertion and for sleep. CBC w/ diff 11/12/13- WBC 10,600, hemoglobin 13.6, 85% neutrophils CXR 11/12/13 IMPRESSION:  No evidence of acute cardiopulmonary disease.  Electronically Signed  By: Julian Hy M.D.  On: 11/12/2013 14:00  06/13/14- 83 yo M former smoker followed for COPD/ emphysema, pulmonary nodules , complicated by CAD, hx lung nodules FOLLOWS FOR: patient states trouble with breathing  worsening last 3 days.  Patient has had wheezing; he denies chest tightness.  12/05/14- 33 yo M former smoker followed for COPD/ emphysema, pulmonary nodules , complicated by CAD, hx lung nodules ACUTE VISIT: Increased congestion; "got sick last week; thought it was the flu"-upset stomach. That cleared up and started having congestion; currently on Prednisone was told to see CY if no better. Has had Zpak and pred taper he had at home..  12/19/14- 66 yo M former smoker followed for COPD/ emphysema, pulmonary nodules , complicated by CAD, hx lung nodules FOLLOWS [id number] better, sob better,fell at home 4-5 days ago-bruised on oxygen tubing,sore in back,no cough,no wheeze. We gave Stiolto/ Pulmicort- he has not tried yet. O2 3-5 liters/[facility] Tripped/ fell hurting back- PCP treating, affects sleep. CXR 12/05/14 IMPRESSION: COPD without acute abnormality. Electronically Signed  By: Inez Catalina M.D.  On: 12/05/2014 10:41    06/19/15-79 yo M former smoker followed for COPD/ emphysema, pulmonary nodules, hypxoic resp failure , complicated by CAD,  O2 3 L/[facility] FOLLOWS FOR: Pt states he went back to Symbicort and Spiriva because they helped more than the stiolto and pulmicort. Pt states he feels he is more SOB than his normal, also increase in prod cough with clear to yellow mucus. Pt denies CP/tightness.  Has had flu vaccine Gradually more dyspnea on exertion and more need for continuous oxygen use over the last year. Some increased cough with thick clear sputum. Denies adenopathy, chest pain, palpitation, swelling feet, blood.  06/30/15- 58 yo M former smoker followed for COPD/ emphysema, pulmonary nodules, hypxoic resp failure , complicated by CAD,  O2 3 L/[facility] We had him try daily nebulizer, and sample Bevespi inhaler at [facility] Acute Visit: Follows for COPD. Pt c/o cough with yellow/dark colored mucus, some wheeze x 3 days, SOB and chest tightness. Pt denies f/n/v/d/chills. Pt has used  albuterol HFA with no symptom relief. Pt is on 3L/O2 continuous.  Didn't try sample Bevespi yet because acutely ill.  Review of Systems-see HPI Constitutional:   No-   weight loss, night sweats fevers, no-chills, +fatigue, lassitude. HEENT:   No-  headaches, difficulty swallowing, tooth/dental problems, +sore throat,       No-  sneezing, itching, ear ache, nasal congestion, post nasal drip,  CV:  No-chest pain, no-orthopnea, PND, No-swelling in lower extremities, anasarca, dizziness, palpitations Resp: + shortness of breath with exertion, not at rest.              + cough,  + non-productive cough,  No-  coughing up of blood.             + in color of mucus.  No- wheezing.   Skin: No-   rash or lesions. GI:  No-   heartburn, indigestion, abdominal pain, nausea,   GU: No-   dysuria,  MS:  No-   joint pain or swelling.  Neuro-  Psych:  No- change in mood or affect. No acute depression or anxiety.  No memory loss.  OBJ- General- Alert, Oriented, Affect-appropriate, Distress- none acute. Skin- rash-none, lesions- none, excoriation- none Lymphadenopathy- none Head- atraumatic            Eyes- Gross vision intact, PERRLA, conjunctivae clear secretions            Ears- Hearing, canals-normal            Nose- Clear, no-Septal dev, mucus, polyps, erosion, perforation             Throat- Mallampati II , mucosa clear , drainage- none, tonsils- atrophic.  Neck- flexible , trachea midline, no stridor , thyroid nl, carotid no bruit Chest - symmetrical excursion , unlabored           Heart/CV- RRR , no murmur , no gallop  , no rub, nl s1 s2                           - JVD- none , edema-none, stasis changes- none, varices- none           Lung-  wheeze + bilateral, unlabored, cough+-none , dullness-none, rub- none           Chest wall-  Abd-  Br/ Gen/ Rectal- Not done, not indicated Extrem- cyanosis- none, clubbing, none, atrophy- none, strength- nl.  Neuro- grossly intact to observation

## 2015-06-30 NOTE — Assessment & Plan Note (Signed)
He is chronically oxygen dependent now

## 2015-07-01 ENCOUNTER — Encounter (HOSPITAL_COMMUNITY): Payer: Self-pay

## 2015-07-03 ENCOUNTER — Encounter (HOSPITAL_COMMUNITY): Payer: Self-pay

## 2015-07-08 ENCOUNTER — Encounter (HOSPITAL_COMMUNITY)
Admission: RE | Admit: 2015-07-08 | Discharge: 2015-07-08 | Disposition: A | Discharge status: Home / Self Care | Payer: Self-pay | Source: Ambulatory Visit | Attending: Internal Medicine | Admitting: Internal Medicine

## 2015-07-10 ENCOUNTER — Encounter (HOSPITAL_COMMUNITY): Payer: Self-pay

## 2015-07-15 ENCOUNTER — Encounter (HOSPITAL_COMMUNITY)
Admission: RE | Admit: 2015-07-15 | Discharge: 2015-07-15 | Disposition: A | Discharge status: Home / Self Care | Payer: Self-pay | Source: Ambulatory Visit | Attending: Internal Medicine | Admitting: Internal Medicine

## 2015-07-17 ENCOUNTER — Encounter (HOSPITAL_COMMUNITY): Payer: Self-pay

## 2015-07-17 DIAGNOSIS — J984 Other disorders of lung: Secondary | ICD-10-CM | POA: Insufficient documentation

## 2015-07-17 DIAGNOSIS — R0602 Shortness of breath: Secondary | ICD-10-CM | POA: Insufficient documentation

## 2015-07-17 DIAGNOSIS — J449 Chronic obstructive pulmonary disease, unspecified: Secondary | ICD-10-CM | POA: Insufficient documentation

## 2015-07-17 DIAGNOSIS — Z5189 Encounter for other specified aftercare: Secondary | ICD-10-CM | POA: Insufficient documentation

## 2015-07-22 ENCOUNTER — Encounter (HOSPITAL_COMMUNITY)
Admission: RE | Admit: 2015-07-22 | Discharge: 2015-07-22 | Disposition: A | Discharge status: Home / Self Care | Payer: Self-pay | Source: Ambulatory Visit | Attending: Internal Medicine | Admitting: Internal Medicine

## 2015-07-24 ENCOUNTER — Encounter (HOSPITAL_COMMUNITY)
Admission: RE | Admit: 2015-07-24 | Discharge: 2015-07-24 | Disposition: A | Discharge status: Home / Self Care | Payer: Self-pay | Source: Ambulatory Visit | Attending: Internal Medicine | Admitting: Internal Medicine

## 2015-07-25 ENCOUNTER — Other Ambulatory Visit: Payer: Self-pay | Admitting: Internal Medicine

## 2015-07-29 ENCOUNTER — Encounter (HOSPITAL_COMMUNITY)
Admission: RE | Admit: 2015-07-29 | Discharge: 2015-07-29 | Disposition: A | Discharge status: Home / Self Care | Payer: Self-pay | Source: Ambulatory Visit | Attending: Internal Medicine | Admitting: Internal Medicine

## 2015-07-31 ENCOUNTER — Encounter (HOSPITAL_COMMUNITY)
Admission: RE | Admit: 2015-07-31 | Discharge: 2015-07-31 | Disposition: A | Discharge status: Home / Self Care | Payer: Self-pay | Source: Ambulatory Visit | Attending: Internal Medicine | Admitting: Internal Medicine

## 2015-08-05 ENCOUNTER — Encounter (HOSPITAL_COMMUNITY)
Admission: RE | Admit: 2015-08-05 | Discharge: 2015-08-05 | Disposition: A | Discharge status: Home / Self Care | Payer: Self-pay | Source: Ambulatory Visit | Attending: Internal Medicine | Admitting: Internal Medicine

## 2015-08-07 ENCOUNTER — Encounter (HOSPITAL_COMMUNITY): Admission: RE | Admit: 2015-08-07 | Payer: Self-pay | Source: Ambulatory Visit

## 2015-08-12 ENCOUNTER — Encounter (HOSPITAL_COMMUNITY): Payer: Self-pay

## 2015-08-14 ENCOUNTER — Encounter (HOSPITAL_COMMUNITY)
Admission: RE | Admit: 2015-08-14 | Discharge: 2015-08-14 | Disposition: A | Discharge status: Home / Self Care | Payer: Self-pay | Source: Ambulatory Visit | Attending: Internal Medicine | Admitting: Internal Medicine

## 2015-08-19 ENCOUNTER — Encounter (HOSPITAL_COMMUNITY)
Admission: RE | Admit: 2015-08-19 | Discharge: 2015-08-19 | Disposition: A | Discharge status: Home / Self Care | Payer: Self-pay | Source: Ambulatory Visit | Attending: Internal Medicine | Admitting: Internal Medicine

## 2015-08-19 DIAGNOSIS — Z5189 Encounter for other specified aftercare: Secondary | ICD-10-CM | POA: Insufficient documentation

## 2015-08-19 DIAGNOSIS — J984 Other disorders of lung: Secondary | ICD-10-CM | POA: Insufficient documentation

## 2015-08-19 DIAGNOSIS — R0602 Shortness of breath: Secondary | ICD-10-CM | POA: Insufficient documentation

## 2015-08-19 DIAGNOSIS — J449 Chronic obstructive pulmonary disease, unspecified: Secondary | ICD-10-CM | POA: Insufficient documentation

## 2015-08-20 DIAGNOSIS — R3915 Urgency of urination: Secondary | ICD-10-CM | POA: Diagnosis not present

## 2015-08-20 DIAGNOSIS — R351 Nocturia: Secondary | ICD-10-CM | POA: Diagnosis not present

## 2015-08-20 DIAGNOSIS — Z0000 Encounter for general adult medical examination without abnormal findings: Secondary | ICD-10-CM | POA: Diagnosis not present

## 2015-08-21 ENCOUNTER — Encounter (HOSPITAL_COMMUNITY)
Admission: RE | Admit: 2015-08-21 | Discharge: 2015-08-21 | Disposition: A | Discharge status: Home / Self Care | Payer: Self-pay | Source: Ambulatory Visit | Attending: Internal Medicine | Admitting: Internal Medicine

## 2015-08-26 ENCOUNTER — Encounter (HOSPITAL_COMMUNITY): Payer: Self-pay

## 2015-08-28 ENCOUNTER — Encounter (HOSPITAL_COMMUNITY)
Admission: RE | Admit: 2015-08-28 | Discharge: 2015-08-28 | Disposition: A | Discharge status: Home / Self Care | Payer: Self-pay | Source: Ambulatory Visit | Attending: Internal Medicine | Admitting: Internal Medicine

## 2015-09-02 ENCOUNTER — Encounter (HOSPITAL_COMMUNITY)
Admission: RE | Admit: 2015-09-02 | Discharge: 2015-09-02 | Disposition: A | Discharge status: Home / Self Care | Payer: Self-pay | Source: Ambulatory Visit | Attending: Internal Medicine | Admitting: Internal Medicine

## 2015-09-04 ENCOUNTER — Encounter (HOSPITAL_COMMUNITY)
Admission: RE | Admit: 2015-09-04 | Discharge: 2015-09-04 | Disposition: A | Discharge status: Home / Self Care | Payer: Self-pay | Source: Ambulatory Visit | Attending: Internal Medicine | Admitting: Internal Medicine

## 2015-09-09 ENCOUNTER — Encounter (HOSPITAL_COMMUNITY)
Admission: RE | Admit: 2015-09-09 | Discharge: 2015-09-09 | Disposition: A | Discharge status: Home / Self Care | Payer: Self-pay | Source: Ambulatory Visit | Attending: Internal Medicine | Admitting: Internal Medicine

## 2015-09-11 ENCOUNTER — Encounter (HOSPITAL_COMMUNITY)
Admission: RE | Admit: 2015-09-11 | Discharge: 2015-09-11 | Disposition: A | Discharge status: Home / Self Care | Payer: Self-pay | Source: Ambulatory Visit | Attending: Internal Medicine | Admitting: Internal Medicine

## 2015-09-16 ENCOUNTER — Encounter (HOSPITAL_COMMUNITY)
Admission: RE | Admit: 2015-09-16 | Discharge: 2015-09-16 | Disposition: A | Discharge status: Home / Self Care | Payer: Self-pay | Source: Ambulatory Visit | Attending: Internal Medicine | Admitting: Internal Medicine

## 2015-09-18 ENCOUNTER — Encounter (HOSPITAL_COMMUNITY)
Admission: RE | Admit: 2015-09-18 | Discharge: 2015-09-18 | Disposition: A | Discharge status: Home / Self Care | Payer: Self-pay | Source: Ambulatory Visit | Attending: Internal Medicine | Admitting: Internal Medicine

## 2015-09-18 DIAGNOSIS — J449 Chronic obstructive pulmonary disease, unspecified: Secondary | ICD-10-CM | POA: Insufficient documentation

## 2015-09-18 DIAGNOSIS — J984 Other disorders of lung: Secondary | ICD-10-CM | POA: Insufficient documentation

## 2015-09-18 DIAGNOSIS — Z5189 Encounter for other specified aftercare: Secondary | ICD-10-CM | POA: Insufficient documentation

## 2015-09-18 DIAGNOSIS — R0602 Shortness of breath: Secondary | ICD-10-CM | POA: Insufficient documentation

## 2015-09-23 ENCOUNTER — Encounter (HOSPITAL_COMMUNITY)
Admission: RE | Admit: 2015-09-23 | Discharge: 2015-09-23 | Disposition: A | Discharge status: Home / Self Care | Payer: Self-pay | Source: Ambulatory Visit | Attending: Internal Medicine | Admitting: Internal Medicine

## 2015-09-25 ENCOUNTER — Encounter (HOSPITAL_COMMUNITY)
Admission: RE | Admit: 2015-09-25 | Discharge: 2015-09-25 | Disposition: A | Discharge status: Home / Self Care | Payer: Self-pay | Source: Ambulatory Visit | Attending: Internal Medicine | Admitting: Internal Medicine

## 2015-09-30 ENCOUNTER — Encounter (HOSPITAL_COMMUNITY): Payer: Self-pay

## 2015-10-02 ENCOUNTER — Encounter (HOSPITAL_COMMUNITY)
Admission: RE | Admit: 2015-10-02 | Discharge: 2015-10-02 | Disposition: A | Discharge status: Home / Self Care | Payer: Self-pay | Source: Ambulatory Visit | Attending: Internal Medicine | Admitting: Internal Medicine

## 2015-10-07 ENCOUNTER — Encounter (HOSPITAL_COMMUNITY)
Admission: RE | Admit: 2015-10-07 | Discharge: 2015-10-07 | Disposition: A | Discharge status: Home / Self Care | Payer: Self-pay | Source: Ambulatory Visit | Attending: Internal Medicine | Admitting: Internal Medicine

## 2015-10-09 ENCOUNTER — Encounter (HOSPITAL_COMMUNITY)
Admission: RE | Admit: 2015-10-09 | Discharge: 2015-10-09 | Disposition: A | Discharge status: Home / Self Care | Payer: Self-pay | Source: Ambulatory Visit | Attending: Internal Medicine | Admitting: Internal Medicine

## 2015-10-14 ENCOUNTER — Encounter (HOSPITAL_COMMUNITY)
Admission: RE | Admit: 2015-10-14 | Discharge: 2015-10-14 | Disposition: A | Discharge status: Home / Self Care | Payer: Self-pay | Source: Ambulatory Visit | Attending: Internal Medicine | Admitting: Internal Medicine

## 2015-10-16 ENCOUNTER — Encounter (HOSPITAL_COMMUNITY): Payer: Self-pay

## 2015-10-16 DIAGNOSIS — J984 Other disorders of lung: Secondary | ICD-10-CM | POA: Insufficient documentation

## 2015-10-16 DIAGNOSIS — Z5189 Encounter for other specified aftercare: Secondary | ICD-10-CM | POA: Insufficient documentation

## 2015-10-16 DIAGNOSIS — R0602 Shortness of breath: Secondary | ICD-10-CM | POA: Insufficient documentation

## 2015-10-16 DIAGNOSIS — J449 Chronic obstructive pulmonary disease, unspecified: Secondary | ICD-10-CM | POA: Insufficient documentation

## 2015-10-21 ENCOUNTER — Encounter (HOSPITAL_COMMUNITY)
Admission: RE | Admit: 2015-10-21 | Discharge: 2015-10-21 | Disposition: A | Discharge status: Home / Self Care | Payer: Self-pay | Source: Ambulatory Visit | Attending: Internal Medicine | Admitting: Internal Medicine

## 2015-10-23 ENCOUNTER — Encounter: Payer: Self-pay | Admitting: Internal Medicine

## 2015-10-23 ENCOUNTER — Encounter (HOSPITAL_COMMUNITY)
Admission: RE | Admit: 2015-10-23 | Discharge: 2015-10-23 | Disposition: A | Discharge status: Home / Self Care | Payer: Self-pay | Source: Ambulatory Visit | Attending: Internal Medicine | Admitting: Internal Medicine

## 2015-10-23 ENCOUNTER — Ambulatory Visit (INDEPENDENT_AMBULATORY_CARE_PROVIDER_SITE_OTHER)
Admission: RE | Admit: 2015-10-23 | Discharge: 2015-10-23 | Disposition: A | Discharge status: Home / Self Care | Payer: Medicare Other | Source: Ambulatory Visit | Attending: Internal Medicine | Admitting: Internal Medicine

## 2015-10-23 ENCOUNTER — Ambulatory Visit (INDEPENDENT_AMBULATORY_CARE_PROVIDER_SITE_OTHER): Payer: Medicare Other | Admitting: Internal Medicine

## 2015-10-23 VITALS — BP 120/76 | HR 92 | Ht 69.0 in | Wt 168.8 lb

## 2015-10-23 DIAGNOSIS — J449 Chronic obstructive pulmonary disease, unspecified: Secondary | ICD-10-CM

## 2015-10-23 DIAGNOSIS — J441 Chronic obstructive pulmonary disease with (acute) exacerbation: Secondary | ICD-10-CM

## 2015-10-23 DIAGNOSIS — R918 Other nonspecific abnormal finding of lung field: Secondary | ICD-10-CM | POA: Diagnosis not present

## 2015-10-23 DIAGNOSIS — J9611 Chronic respiratory failure with hypoxia: Secondary | ICD-10-CM

## 2015-10-23 MED ORDER — PREDNISONE 10 MG PO TABS: ORAL_TABLET | ORAL | Status: DC

## 2015-10-23 MED ORDER — BUDESONIDE-FORMOTEROL FUMARATE 160-4.5 MCG/ACT IN AERO: INHALATION_SPRAY | RESPIRATORY_TRACT | Status: DC

## 2015-10-23 MED ORDER — TIOTROPIUM BROMIDE MONOHYDRATE 2.5 MCG/ACT IN AERS: INHALATION_SPRAY | RESPIRATORY_TRACT | Status: DC

## 2015-10-23 NOTE — Assessment & Plan Note (Signed)
He remains dependent on supplemental oxygen.

## 2015-10-23 NOTE — Patient Instructions (Signed)
Med refills sent  Order- CXR  Dx COPD mixed type, lung nodules  We discussed alternative locations for pulmonary rehab, and available programs like [facility]

## 2015-10-23 NOTE — Assessment & Plan Note (Signed)
Recent nonspecific exacerbation responded to prednisone taper. No definite infection. Plan-refill Symbicort and Spiriva with discussion. Prednisone taper prescription to hold

## 2015-10-23 NOTE — Progress Notes (Signed)
Patient ID: [name], male    DOB: 06-13-1935, 81 y.o.   MRN: [id number] PCP [name] HPI 01/25/11- 24 yo [name] former smoker followed for COPD/ emphysema with recent concern for pulmonary nodules. Last here May 20, 2010- note reviewed. CT done during the winter showed nodules.  PET positive nodule Left lung faded before needle bx. He had gone to [name] for second opinion w/ [name] [name] where severity of his advanced lung disease was emphasized.  lung volume reduction and transplant were mentioned although I don't know how seriously, given his age.[name] He comes today to update and to have conference including his wife:  Conference on overall status: He had concern about amount of radiation from imaging and I reminded him that is why we chose to do [name] 05/20/10 which showed stable COPD. PFT 07/01/05 - FEV1 1.13/ 0.39; FEV1/FVC 0.25; DLCO 0.52; insignif response to bronchodilator. PFT at [name] 11/26/10- FEV1 0.82/ 0.25; FEV1/FVC 0.22 Quality of life and prognosis discussed. He has noted some decline over past year. He could walk through [employer] without O2 then and no longer can. Emphysema pattern with little cough, wheeze, phlegm. No chest pain, palpitation or ankle edema. After long discussion we agreed to an inquiry to [name] for consideration. I told him his age made acceptance unlikely. We discussed limited role of meds,He has had Pneumovax, annual flu vax, Pulmonary Rehab, and test normal for a1AT in the past. Has O2 2l/[name] for sleep and exertion, home nebulizer. He asks another script for prednisone to hold and we discussed side effects.   05/20/11 81 yo [name] former smoker followed for COPD/ emphysema with recent concern for pulmonary nodules. .. wife here He and his wife come today for an extended review of his early experience with [name] evaluation. PFT- [name] 04/27/11- FEV1 0.78/23%, FEV1/FVC 0.27, TLC 98%, RV 163%, DLCO 37%. ABG-room air-04/27/2011  pH 7.42, PCO2 40, PO2 63, bicarbonate 26, O2 saturation 91.9% 6 minute walk test 04/27/2011: 314.6 [name] (60% to distance predicted) with significant dyspnea while wearing oxygen at 3 L. He came away with significant reservations about the prospects for transplant. He asked for my estimate of his life expectancy without transplant. I suggested that barring an intervening acute illness, an additional 5 years is possible. He understands this is a very loose estimate. I deferred questions about hepatitis B vaccination and testosterone replacement to his primary physician. He wanted to know what else we might do to test and treat him if he chose not to seek transplant. We will watch his lung nodules with chest x-ray and/or CT, but our ability to intervene would be limited. If he does not have transplant, I don't know that cardiac stress test or repeat pulmonary function tests would add much useful  information unless symptoms change.  10/20/11- 26 yo [name] former smoker followed for COPD/ emphysema with recent concern for pulmonary nodules [name] 04/27/11- COPD with no mention of nodules. Followup at [name] has been deferred until September. They want stress test. He is leaning away from transplant at this time of blood and he wanted my thoughts. Discussed his left upper lobe nodule, atherosclerosis on CT scan in April of 2012. He continues pulmonary rehabilitation. Oxygen saturation on 3 L is 96% during exercise. He feels his current life quality is okay. Denies chest pain, acute discomfort, edema. Little cough or phlegm.  05/04/12-  41 yo [name] former smoker followed for COPD/ emphysema with  recent concern for pulmonary nodules [name] 04/27/11- COPD with no mention of nodules. Sob same, cough yellow to clear, no wheezing, no fcs He talked about another meeting with the [name]. At this point he is less interested in lung transplant as an option. Echocardiogram 12/16/2011 showed EF 65-70%, no  pulmonary hypertension, grade 1 diastolic dysfunction. [name] increased nocturia. Oxygen is used with sleep and exertion  11/02/12- 35 yo [name] former smoker followed for COPD/ emphysema with recent concern for pulmonary nodules FOLLOWS FOR: states breathing is about the same since last visit; would like [name] inhaler sample ( his current insurance will cover); also needs refill for [name]. Stable through the winter with no acute infection. Remains on oxygen now, especially with exertion. Has used [name] but would like to try [name] again. a1AT normal MM. CT 05/19/12- images reviewed again with him IMPRESSION:  1. Left upper lobe nodule is stable from 12/11/2010. Additional  follow-up in one year is recommended to document 2 years of  stability. This recommendation follows the consensus statement:  Guidelines for Management of Small Pulmonary Nodules Detected on CT  Scans: A Statement from the [place] as published in  Radiology 2005; 237:395-400.  2. Additional scattered pulmonary nodules are stable and therefore  considered benign.  3. Previously seen left upper lobe subpleural airspace  consolidation has resolved in the interval.  Original Report Authenticated By: [name], [name].D.  12/15/12- 28 yo [name] former smoker followed for COPD/ emphysema with recent concern for pulmonary nodules FOLLOWS FOR: pt reports x 2wks had a scratchy throat, nonprod cough, fatigue -- statrted taking mucinex x3days seems to help some [name] at pulmonary rehabilitation sent him here. O2 3L/ [name]  01/11/13 81 yo [name] former smoker followed for COPD/ emphysema with recent concern for pulmonary nodules   Wife here ACUTE VISIT: fevers, increased SOB, no energy, slight sore throat (not bad), cough-mostly dry.Pain in left side-unsure if from cough. At beach last week. Feels ill. May have gotten chills. Now fever x5 days, 101-102. Increased his oxygen to 4 L. Short of breath, no energy, mild sore  throat, dry cough, tussive left chest pain. No dysuria or nausea. Saw PCP on Tuesday, suspected viral infection.  06/05/13- 17 yo [name] former smoker followed for COPD/ emphysema, pulmonary nodules        FOLLOWS FOR:  Breathing is unchanged since last OV.  Discuss CT scan No signif  Change. Slow recovery from pneumonia- xrays reviewed.Little cough, no phlegm. Trying Breo.  Ankles swell some. No chest pain or palpitation. O2 2-3 L/ [name] CT 05/17/13  Also notes atherosclerosis aorta IMPRESSION:  1. Stable uptake stress set left upper lobe nodule is stable in size  from previous exam. Interval scarring and architectural distortion  within this area is likely the sequelae of inflammation or  infection.  2. The other previously referenced nodules are unchanged from  previous exam and are most likely benign.  3. Emphysema.  Electronically Signed  By: Kerby Moors [name].D.  On: 05/17/2013 12:04  11/12/13- 89 yo [name] former smoker followed for COPD/ emphysema, pulmonary nodules  ACUTE VISIT:  Fever and congestion with cough and brown mucus.  Also, has wheezing  wife here More short of breath over the weekend with stuffy nose. Temperature 100.1 last night. Started Z-Pak yesterday. Much cough productive brown. More short of breath, no energy, malaise.   12/13/13- 32 yo [name] former smoker followed for COPD/ emphysema, pulmonary nodules  FOLLOWS FOR:  Breathing  improved since last OV.  Still having sob with exertion Comfortable off of oxygen while sitting quietly but needs oxygen at 3 L/ [name] for any exertion and for sleep. CBC w/ diff 11/12/13- WBC 10,600, hemoglobin 13.6, 85% neutrophils [name] 11/12/13 IMPRESSION:  No evidence of acute cardiopulmonary disease.  Electronically Signed  By: Julian Hy [name].D.  On: 11/12/2013 14:00  06/13/14- 16 yo [name] former smoker followed for COPD/ emphysema, pulmonary nodules , complicated by CAD, hx lung nodules FOLLOWS FOR: patient states trouble with breathing  worsening last 3 days.  Patient has had wheezing; he denies chest tightness.  12/05/14- 70 yo [name] former smoker followed for COPD/ emphysema, pulmonary nodules , complicated by CAD, hx lung nodules ACUTE VISIT: Increased congestion; "got sick last week; thought it was the flu"-upset stomach. That cleared up and started having congestion; currently on Prednisone was told to see CY if no better. Has had Zpak and pred taper he had at home..  12/19/14- 87 yo [name] former smoker followed for COPD/ emphysema, pulmonary nodules , complicated by CAD, hx lung nodules FOLLOWS [id number] better, sob better,fell at home 4-5 days ago-bruised on oxygen tubing,sore in back,no cough,no wheeze. We gave Stiolto/ Pulmicort- he has not tried yet. O2 3-5 liters/[name] Tripped/ fell hurting back- PCP treating, affects sleep. [name] 12/05/14 IMPRESSION: COPD without acute abnormality. Electronically Signed  By: Inez Catalina [name].D.  On: 12/05/2014 10:41    06/19/15-79 yo [name] former smoker followed for COPD/ emphysema, pulmonary nodules, hypxoic resp failure , complicated by CAD,  O2 3 L/[name] FOLLOWS FOR: Pt states he went back to Symbicort and [name] because they helped more than the stiolto and pulmicort. Pt states he feels he is more SOB than his normal, also increase in prod cough with clear to yellow mucus. Pt denies CP/tightness.  Has had flu vaccine Gradually more dyspnea on exertion and more need for continuous oxygen use over the last year. Some increased cough with thick clear sputum. Denies adenopathy, chest pain, palpitation, swelling feet, blood.  06/30/15- 58 yo [name] former smoker followed for COPD/ emphysema, pulmonary nodules, hypxoic resp failure , complicated by CAD,  O2 3 L/[name] We had him try daily nebulizer, and sample Bevespi inhaler at [facility] Acute Visit: Follows for COPD. Pt c/o cough with yellow/dark colored mucus, some wheeze x 3 days, SOB and chest tightness. Pt denies f/n/v/d/chills. Pt has used  albuterol [name] with no symptom relief. Pt is on 3L/O2 continuous.  Didn't try sample Bevespi yet because acutely ill.  10/23/2015-81 year old male former smoker followed for COPD/emphysema, pulmonary nodules, hypoxic respiratory failure, complicated by CAD O2 3 L/[name] FOLLOWS FOR: Pt states his breathing has been doing good with pulmonary rehab and using O2. More this production with cough in the last 2 or 3 weeks. He took prednisone taper on his own and it has resolved. Bivespi was no better than [name] alone. His maintenance group in pulmonary rehabilitation is likely to be graduated to make room for new people. We discussed alternatives.  Review of Systems-see HPI Constitutional:   No-   weight loss, night sweats fevers, no-chills, +fatigue, lassitude. HEENT:   No-  headaches, difficulty swallowing, tooth/dental problems, +sore throat,       No-  sneezing, itching, ear ache, nasal congestion, post nasal drip,  CV:  No-chest pain, no-orthopnea, PND, No-swelling in lower extremities, anasarca, dizziness, palpitations Resp: + shortness of breath with exertion, not at rest.              +  cough,  + non-productive cough,  No-  coughing up of blood.             + in color of mucus.  No- wheezing.   Skin: No-   rash or lesions. GI:  No-   heartburn, indigestion, abdominal pain, nausea,   GU: No-   dysuria,  MS:  No-   joint pain or swelling.  Neuro-  Psych:  No- change in mood or affect. No acute depression or anxiety.  No memory loss.  OBJ- General- Alert, Oriented, Affect-appropriate, Distress- none acute. + O2 Skin- rash-none, lesions- none, excoriation- none Lymphadenopathy- none Head- atraumatic            Eyes- Gross vision intact, PERRLA, conjunctivae clear secretions            Ears- Hearing, canals-normal            Nose- Clear, no-Septal dev, mucus, polyps, erosion, perforation             Throat- Mallampati II , mucosa clear , drainage- none, tonsils- atrophic.  Neck-  flexible , trachea midline, no stridor , thyroid nl, carotid no bruit Chest - symmetrical excursion , unlabored           Heart/CV- RRR , no murmur , no gallop  , no rub, nl s1 s2                           - JVD- none , edema-none, stasis changes- none, varices- none           Lung-  wheeze -none, unlabored, cough+-none , dullness-none, rub- none           Chest wall-  Abd-  Br/ Gen/ Rectal- Not done, not indicated Extrem- cyanosis- none, clubbing, none, atrophy- none, strength- nl.  Neuro- grossly intact to observation

## 2015-10-24 DIAGNOSIS — I10 Essential (primary) hypertension: Secondary | ICD-10-CM | POA: Diagnosis not present

## 2015-10-24 DIAGNOSIS — E7800 Pure hypercholesterolemia, unspecified: Secondary | ICD-10-CM | POA: Diagnosis not present

## 2015-10-24 DIAGNOSIS — I471 Supraventricular tachycardia: Secondary | ICD-10-CM | POA: Diagnosis not present

## 2015-10-24 DIAGNOSIS — R972 Elevated prostate specific antigen [PSA]: Secondary | ICD-10-CM | POA: Diagnosis not present

## 2015-10-24 DIAGNOSIS — Z125 Encounter for screening for malignant neoplasm of prostate: Secondary | ICD-10-CM | POA: Diagnosis not present

## 2015-10-28 ENCOUNTER — Encounter (HOSPITAL_COMMUNITY): Payer: Self-pay

## 2015-10-30 ENCOUNTER — Encounter (HOSPITAL_COMMUNITY): Payer: Self-pay

## 2015-10-30 DIAGNOSIS — I471 Supraventricular tachycardia: Secondary | ICD-10-CM | POA: Diagnosis not present

## 2015-10-30 DIAGNOSIS — J449 Chronic obstructive pulmonary disease, unspecified: Secondary | ICD-10-CM | POA: Diagnosis not present

## 2015-10-30 DIAGNOSIS — I10 Essential (primary) hypertension: Secondary | ICD-10-CM | POA: Diagnosis not present

## 2015-11-04 ENCOUNTER — Encounter (HOSPITAL_COMMUNITY)
Admission: RE | Admit: 2015-11-04 | Discharge: 2015-11-04 | Disposition: A | Discharge status: Home / Self Care | Payer: Self-pay | Source: Ambulatory Visit | Attending: Internal Medicine | Admitting: Internal Medicine

## 2015-11-06 ENCOUNTER — Encounter (HOSPITAL_COMMUNITY)
Admission: RE | Admit: 2015-11-06 | Discharge: 2015-11-06 | Disposition: A | Discharge status: Home / Self Care | Payer: Self-pay | Source: Ambulatory Visit | Attending: Internal Medicine | Admitting: Internal Medicine

## 2015-11-11 ENCOUNTER — Encounter (HOSPITAL_COMMUNITY)
Admission: RE | Admit: 2015-11-11 | Discharge: 2015-11-11 | Disposition: A | Discharge status: Home / Self Care | Payer: Self-pay | Source: Ambulatory Visit | Attending: Internal Medicine | Admitting: Internal Medicine

## 2015-11-13 ENCOUNTER — Encounter (HOSPITAL_COMMUNITY)
Admission: RE | Admit: 2015-11-13 | Discharge: 2015-11-13 | Disposition: A | Discharge status: Home / Self Care | Payer: Self-pay | Source: Ambulatory Visit | Attending: Internal Medicine | Admitting: Internal Medicine

## 2015-11-18 ENCOUNTER — Encounter (HOSPITAL_COMMUNITY): Payer: Self-pay

## 2015-11-18 DIAGNOSIS — J449 Chronic obstructive pulmonary disease, unspecified: Secondary | ICD-10-CM | POA: Insufficient documentation

## 2015-11-18 DIAGNOSIS — J984 Other disorders of lung: Secondary | ICD-10-CM | POA: Insufficient documentation

## 2015-11-18 DIAGNOSIS — R0602 Shortness of breath: Secondary | ICD-10-CM | POA: Insufficient documentation

## 2015-11-18 DIAGNOSIS — Z5189 Encounter for other specified aftercare: Secondary | ICD-10-CM | POA: Insufficient documentation

## 2015-11-20 ENCOUNTER — Encounter (HOSPITAL_COMMUNITY)
Admission: RE | Admit: 2015-11-20 | Discharge: 2015-11-20 | Disposition: A | Discharge status: Home / Self Care | Payer: Self-pay | Source: Ambulatory Visit | Attending: Internal Medicine | Admitting: Internal Medicine

## 2015-11-25 ENCOUNTER — Encounter (HOSPITAL_COMMUNITY)
Admission: RE | Admit: 2015-11-25 | Discharge: 2015-11-25 | Disposition: A | Discharge status: Home / Self Care | Payer: Self-pay | Source: Ambulatory Visit | Attending: Internal Medicine | Admitting: Internal Medicine

## 2015-11-27 ENCOUNTER — Encounter (HOSPITAL_COMMUNITY)
Admission: RE | Admit: 2015-11-27 | Discharge: 2015-11-27 | Disposition: A | Discharge status: Home / Self Care | Payer: Self-pay | Source: Ambulatory Visit | Attending: Internal Medicine | Admitting: Internal Medicine

## 2015-12-02 ENCOUNTER — Encounter (HOSPITAL_COMMUNITY)
Admission: RE | Admit: 2015-12-02 | Discharge: 2015-12-02 | Disposition: A | Discharge status: Home / Self Care | Payer: Self-pay | Source: Ambulatory Visit | Attending: Internal Medicine | Admitting: Internal Medicine

## 2015-12-04 ENCOUNTER — Encounter (HOSPITAL_COMMUNITY): Admission: RE | Admit: 2015-12-04 | Payer: Self-pay | Source: Ambulatory Visit

## 2015-12-09 ENCOUNTER — Encounter (HOSPITAL_COMMUNITY)
Admission: RE | Admit: 2015-12-09 | Discharge: 2015-12-09 | Disposition: A | Discharge status: Home / Self Care | Payer: Self-pay | Source: Ambulatory Visit | Attending: Internal Medicine | Admitting: Internal Medicine

## 2015-12-11 ENCOUNTER — Encounter (HOSPITAL_COMMUNITY)
Admission: RE | Admit: 2015-12-11 | Discharge: 2015-12-11 | Disposition: A | Discharge status: Home / Self Care | Payer: Self-pay | Source: Ambulatory Visit | Attending: Internal Medicine | Admitting: Internal Medicine

## 2015-12-16 ENCOUNTER — Encounter (HOSPITAL_COMMUNITY)
Admission: RE | Admit: 2015-12-16 | Discharge: 2015-12-16 | Disposition: A | Discharge status: Home / Self Care | Payer: Self-pay | Source: Ambulatory Visit | Attending: Internal Medicine | Admitting: Internal Medicine

## 2015-12-16 DIAGNOSIS — Z5189 Encounter for other specified aftercare: Secondary | ICD-10-CM | POA: Insufficient documentation

## 2015-12-16 DIAGNOSIS — R0602 Shortness of breath: Secondary | ICD-10-CM | POA: Insufficient documentation

## 2015-12-16 DIAGNOSIS — J449 Chronic obstructive pulmonary disease, unspecified: Secondary | ICD-10-CM | POA: Insufficient documentation

## 2015-12-16 DIAGNOSIS — J984 Other disorders of lung: Secondary | ICD-10-CM | POA: Insufficient documentation

## 2015-12-18 ENCOUNTER — Encounter (HOSPITAL_COMMUNITY)
Admission: RE | Admit: 2015-12-18 | Discharge: 2015-12-18 | Disposition: A | Discharge status: Home / Self Care | Payer: Self-pay | Source: Ambulatory Visit | Attending: Internal Medicine | Admitting: Internal Medicine

## 2015-12-22 DIAGNOSIS — G608 Other hereditary and idiopathic neuropathies: Secondary | ICD-10-CM | POA: Diagnosis not present

## 2015-12-22 DIAGNOSIS — M2580 Other specified joint disorders, unspecified joint: Secondary | ICD-10-CM | POA: Diagnosis not present

## 2015-12-22 DIAGNOSIS — M79671 Pain in right foot: Secondary | ICD-10-CM | POA: Diagnosis not present

## 2015-12-23 ENCOUNTER — Encounter (HOSPITAL_COMMUNITY): Payer: Self-pay

## 2015-12-25 ENCOUNTER — Encounter (HOSPITAL_COMMUNITY): Payer: Self-pay

## 2015-12-30 ENCOUNTER — Encounter (HOSPITAL_COMMUNITY): Payer: Self-pay

## 2016-01-01 ENCOUNTER — Encounter (HOSPITAL_COMMUNITY): Payer: Self-pay

## 2016-01-06 ENCOUNTER — Encounter (HOSPITAL_COMMUNITY)
Admission: RE | Admit: 2016-01-06 | Discharge: 2016-01-06 | Disposition: A | Discharge status: Home / Self Care | Payer: Self-pay | Source: Ambulatory Visit | Attending: Internal Medicine | Admitting: Internal Medicine

## 2016-01-08 ENCOUNTER — Encounter (HOSPITAL_COMMUNITY)
Admission: RE | Admit: 2016-01-08 | Discharge: 2016-01-08 | Disposition: A | Discharge status: Home / Self Care | Payer: Self-pay | Source: Ambulatory Visit | Attending: Internal Medicine | Admitting: Internal Medicine

## 2016-01-13 ENCOUNTER — Encounter (HOSPITAL_COMMUNITY)
Admission: RE | Admit: 2016-01-13 | Discharge: 2016-01-13 | Disposition: A | Discharge status: Home / Self Care | Payer: Self-pay | Source: Ambulatory Visit | Attending: Internal Medicine | Admitting: Internal Medicine

## 2016-01-15 ENCOUNTER — Encounter (HOSPITAL_COMMUNITY)
Admission: RE | Admit: 2016-01-15 | Discharge: 2016-01-15 | Disposition: A | Discharge status: Home / Self Care | Payer: Self-pay | Source: Ambulatory Visit | Attending: Internal Medicine | Admitting: Internal Medicine

## 2016-01-15 DIAGNOSIS — J449 Chronic obstructive pulmonary disease, unspecified: Secondary | ICD-10-CM | POA: Insufficient documentation

## 2016-01-15 DIAGNOSIS — J984 Other disorders of lung: Secondary | ICD-10-CM | POA: Insufficient documentation

## 2016-01-15 DIAGNOSIS — R0602 Shortness of breath: Secondary | ICD-10-CM | POA: Insufficient documentation

## 2016-01-15 DIAGNOSIS — Z5189 Encounter for other specified aftercare: Secondary | ICD-10-CM | POA: Insufficient documentation

## 2016-01-20 ENCOUNTER — Encounter (HOSPITAL_COMMUNITY)
Admission: RE | Admit: 2016-01-20 | Discharge: 2016-01-20 | Disposition: A | Discharge status: Home / Self Care | Payer: Self-pay | Source: Ambulatory Visit | Attending: Internal Medicine | Admitting: Internal Medicine

## 2016-02-03 ENCOUNTER — Encounter (HOSPITAL_COMMUNITY)
Admission: RE | Admit: 2016-02-03 | Discharge: 2016-02-03 | Disposition: A | Discharge status: Home / Self Care | Payer: Self-pay | Source: Ambulatory Visit | Attending: Internal Medicine | Admitting: Internal Medicine

## 2016-02-05 ENCOUNTER — Encounter (HOSPITAL_COMMUNITY)
Admission: RE | Admit: 2016-02-05 | Discharge: 2016-02-05 | Disposition: A | Discharge status: Home / Self Care | Payer: Self-pay | Source: Ambulatory Visit | Attending: Internal Medicine | Admitting: Internal Medicine

## 2016-02-10 ENCOUNTER — Encounter (HOSPITAL_COMMUNITY)
Admission: RE | Admit: 2016-02-10 | Discharge: 2016-02-10 | Disposition: A | Discharge status: Home / Self Care | Payer: Self-pay | Source: Ambulatory Visit | Attending: Internal Medicine | Admitting: Internal Medicine

## 2016-02-18 DIAGNOSIS — Z85828 Personal history of other malignant neoplasm of skin: Secondary | ICD-10-CM | POA: Diagnosis not present

## 2016-02-18 DIAGNOSIS — Z86018 Personal history of other benign neoplasm: Secondary | ICD-10-CM | POA: Diagnosis not present

## 2016-02-18 DIAGNOSIS — L821 Other seborrheic keratosis: Secondary | ICD-10-CM | POA: Diagnosis not present

## 2016-02-18 DIAGNOSIS — D692 Other nonthrombocytopenic purpura: Secondary | ICD-10-CM | POA: Diagnosis not present

## 2016-02-18 DIAGNOSIS — L219 Seborrheic dermatitis, unspecified: Secondary | ICD-10-CM | POA: Diagnosis not present

## 2016-02-18 DIAGNOSIS — D225 Melanocytic nevi of trunk: Secondary | ICD-10-CM | POA: Diagnosis not present

## 2016-02-19 ENCOUNTER — Encounter (HOSPITAL_COMMUNITY)
Admission: RE | Admit: 2016-02-19 | Discharge: 2016-02-19 | Disposition: A | Discharge status: Home / Self Care | Payer: Self-pay | Source: Ambulatory Visit | Attending: Internal Medicine | Admitting: Internal Medicine

## 2016-02-19 DIAGNOSIS — Z5189 Encounter for other specified aftercare: Secondary | ICD-10-CM | POA: Insufficient documentation

## 2016-02-19 DIAGNOSIS — J984 Other disorders of lung: Secondary | ICD-10-CM | POA: Insufficient documentation

## 2016-02-19 DIAGNOSIS — J449 Chronic obstructive pulmonary disease, unspecified: Secondary | ICD-10-CM | POA: Insufficient documentation

## 2016-02-19 DIAGNOSIS — R0602 Shortness of breath: Secondary | ICD-10-CM | POA: Insufficient documentation

## 2016-02-24 ENCOUNTER — Encounter (HOSPITAL_COMMUNITY)
Admission: RE | Admit: 2016-02-24 | Discharge: 2016-02-24 | Disposition: A | Discharge status: Home / Self Care | Payer: Self-pay | Source: Ambulatory Visit | Attending: Internal Medicine | Admitting: Internal Medicine

## 2016-02-26 ENCOUNTER — Telehealth: Payer: Self-pay | Admitting: Internal Medicine

## 2016-02-26 ENCOUNTER — Encounter: Payer: Self-pay | Admitting: Internal Medicine

## 2016-02-26 ENCOUNTER — Ambulatory Visit (INDEPENDENT_AMBULATORY_CARE_PROVIDER_SITE_OTHER): Payer: Medicare Other | Admitting: Internal Medicine

## 2016-02-26 ENCOUNTER — Encounter (HOSPITAL_COMMUNITY)
Admission: RE | Admit: 2016-02-26 | Discharge: 2016-02-26 | Disposition: A | Discharge status: Home / Self Care | Payer: Self-pay | Source: Ambulatory Visit | Attending: Internal Medicine | Admitting: Internal Medicine

## 2016-02-26 VITALS — BP 120/58 | HR 74 | Ht 69.0 in | Wt 167.4 lb

## 2016-02-26 DIAGNOSIS — R911 Solitary pulmonary nodule: Secondary | ICD-10-CM | POA: Diagnosis not present

## 2016-02-26 DIAGNOSIS — J9611 Chronic respiratory failure with hypoxia: Secondary | ICD-10-CM

## 2016-02-26 DIAGNOSIS — J439 Emphysema, unspecified: Secondary | ICD-10-CM

## 2016-02-26 NOTE — Progress Notes (Signed)
Patient ID: [name], male    DOB: 06-13-1935, 81 y.o.   MRN: [id number] PCP Dr Hulan Fess HPI 01/25/11- 24 yo M former smoker followed for COPD/ emphysema with recent concern for pulmonary nodules. Last here May 20, 2010- note reviewed. CT done during the winter showed nodules.  PET positive nodule Left lung faded before needle bx. He had gone to [facility] for second opinion w/ Dr [name] where severity of his advanced lung disease was emphasized.  lung volume reduction and transplant were mentioned although I don't know how seriously, given his age.[name] He comes today to update and to have conference including his wife:  Conference on overall status: He had concern about amount of radiation from imaging and I reminded him that is why we chose to do CXR 05/20/10 which showed stable COPD. PFT 07/01/05 - FEV1 1.13/ 0.39; FEV1/FVC 0.25; DLCO 0.52; insignif response to bronchodilator. PFT at [facility] 11/26/10- FEV1 0.82/ 0.25; FEV1/FVC 0.22 Quality of life and prognosis discussed. He has noted some decline over past year. He could walk through [employer] without O2 then and no longer can. Emphysema pattern with little cough, wheeze, phlegm. No chest pain, palpitation or ankle edema. After long discussion we agreed to an inquiry to [facility] Transplant coordinator for consideration. I told him his age made acceptance unlikely. We discussed limited role of meds,He has had Pneumovax, annual flu vax, Pulmonary Rehab, and test normal for a1AT in the past. Has O2 2l/m for sleep and exertion, home nebulizer. He asks another script for prednisone to hold and we discussed side effects.   05/20/11 81 yo M former smoker followed for COPD/ emphysema with recent concern for pulmonary nodules. .. wife here He and his wife come today for an extended review of his early experience with [facility] lung transplant team evaluation. PFT- [facility] 04/27/11- FEV1 0.78/23%, FEV1/FVC 0.27, TLC 98%, RV 163%, DLCO 37%. ABG-room air-04/27/2011  pH 7.42, PCO2 40, PO2 63, bicarbonate 26, O2 saturation 91.9% 6 minute walk test 04/27/2011: 314.6 m (60% to distance predicted) with significant dyspnea while wearing oxygen at 3 L. He came away with significant reservations about the prospects for transplant. He asked for my estimate of his life expectancy without transplant. I suggested that barring an intervening acute illness, an additional 5 years is possible. He understands this is a very loose estimate. I deferred questions about hepatitis B vaccination and testosterone replacement to his primary physician. He wanted to know what else we might do to test and treat him if he chose not to seek transplant. We will watch his lung nodules with chest x-ray and/or CT, but our ability to intervene would be limited. If he does not have transplant, I don't know that cardiac stress test or repeat pulmonary function tests would add much useful  information unless symptoms change.  10/20/11- 26 yo M former smoker followed for COPD/ emphysema with recent concern for pulmonary nodules [facility] CXR 04/27/11- COPD with no mention of nodules. Followup at [facility] pulmonary has been deferred until September. They want stress test. He is leaning away from transplant at this time of blood and he wanted my thoughts. Discussed his left upper lobe nodule, atherosclerosis on CT scan in April of 2012. He continues pulmonary rehabilitation. Oxygen saturation on 3 L is 96% during exercise. He feels his current life quality is okay. Denies chest pain, acute discomfort, edema. Little cough or phlegm.  05/04/12-  41 yo M former smoker followed for COPD/ emphysema with  recent concern for pulmonary nodules [facility] CXR 04/27/11- COPD with no mention of nodules. Sob same, cough yellow to clear, no wheezing, no fcs He talked about another meeting with the [place] transplant doctors. At this point he is less interested in lung transplant as an option. Echocardiogram 12/16/2011 showed EF 65-70%, no  pulmonary hypertension, grade 1 diastolic dysfunction. [name] increased nocturia. Oxygen is used with sleep and exertion  11/02/12- 35 yo M former smoker followed for COPD/ emphysema with recent concern for pulmonary nodules FOLLOWS FOR: states breathing is about the same since last visit; would like [name] inhaler sample ( his current insurance will cover); also needs refill for [facility] HFA. Stable through the winter with no acute infection. Remains on oxygen now, especially with exertion. Has used Spiriva but would like to try [place] again. a1AT normal MM. CT 05/19/12- images reviewed again with him IMPRESSION:  1. Left upper lobe nodule is stable from 12/11/2010. Additional  follow-up in one year is recommended to document 2 years of  stability. This recommendation follows the consensus statement:  Guidelines for Management of Small Pulmonary Nodules Detected on CT  Scans: A Statement from the [place] as published in  Radiology 2005; 237:395-400.  2. Additional scattered pulmonary nodules are stable and therefore  considered benign.  3. Previously seen left upper lobe subpleural airspace  consolidation has resolved in the interval.  Original Report Authenticated By: [name], M.D.  12/15/12- 28 yo M former smoker followed for COPD/ emphysema with recent concern for pulmonary nodules FOLLOWS FOR: pt reports x 2wks had a scratchy throat, nonprod cough, fatigue -- statrted taking mucinex x3days seems to help some [name] at pulmonary rehabilitation sent him here. O2 3L/ [employer]  01/11/13 81 yo M former smoker followed for COPD/ emphysema with recent concern for pulmonary nodules   Wife here ACUTE VISIT: fevers, increased SOB, no energy, slight sore throat (not bad), cough-mostly dry.Pain in left side-unsure if from cough. At beach last week. Feels ill. May have gotten chills. Now fever x5 days, 101-102. Increased his oxygen to 4 L. Short of breath, no energy, mild sore  throat, dry cough, tussive left chest pain. No dysuria or nausea. Saw PCP on Tuesday, suspected viral infection.  06/05/13- 17 yo M former smoker followed for COPD/ emphysema, pulmonary nodules        FOLLOWS FOR:  Breathing is unchanged since last OV.  Discuss CT scan No signif  Change. Slow recovery from pneumonia- xrays reviewed.Little cough, no phlegm. Trying Breo.  Ankles swell some. No chest pain or palpitation. O2 2-3 L/ [employer] CT 05/17/13  Also notes atherosclerosis aorta IMPRESSION:  1. Stable uptake stress set left upper lobe nodule is stable in size  from previous exam. Interval scarring and architectural distortion  within this area is likely the sequelae of inflammation or  infection.  2. The other previously referenced nodules are unchanged from  previous exam and are most likely benign.  3. Emphysema.  Electronically Signed  By: Kerby Moors M.D.  On: 05/17/2013 12:04  11/12/13- 89 yo M former smoker followed for COPD/ emphysema, pulmonary nodules  ACUTE VISIT:  Fever and congestion with cough and brown mucus.  Also, has wheezing  wife here More short of breath over the weekend with stuffy nose. Temperature 100.1 last night. Started Z-Pak yesterday. Much cough productive brown. More short of breath, no energy, malaise.   12/13/13- 32 yo M former smoker followed for COPD/ emphysema, pulmonary nodules  FOLLOWS FOR:  Breathing  improved since last OV.  Still having sob with exertion Comfortable off of oxygen while sitting quietly but needs oxygen at 3 L/ [employer] for any exertion and for sleep. CBC w/ diff 11/12/13- WBC 10,600, hemoglobin 13.6, 85% neutrophils CXR 11/12/13 IMPRESSION:  No evidence of acute cardiopulmonary disease.  Electronically Signed  By: Julian Hy M.D.  On: 11/12/2013 14:00  06/13/14- 56 yo M former smoker followed for COPD/ emphysema, pulmonary nodules , complicated by CAD, hx lung nodules FOLLOWS FOR: patient states trouble with breathing  worsening last 3 days.  Patient has had wheezing; he denies chest tightness.  12/05/14- 34 yo M former smoker followed for COPD/ emphysema, pulmonary nodules , complicated by CAD, hx lung nodules ACUTE VISIT: Increased congestion; "got sick last week; thought it was the flu"-upset stomach. That cleared up and started having congestion; currently on Prednisone was told to see CY if no better. Has had Zpak and pred taper he had at home..  12/19/14- 89 yo M former smoker followed for COPD/ emphysema, pulmonary nodules , complicated by CAD, hx lung nodules FOLLOWS [id number] better, sob better,fell at home 4-5 days ago-bruised on oxygen tubing,sore in back,no cough,no wheeze. We gave Stiolto/ Pulmicort- he has not tried yet. O2 3-5 liters/[employer] Tripped/ fell hurting back- PCP treating, affects sleep. CXR 12/05/14 IMPRESSION: COPD without acute abnormality. Electronically Signed  By: Inez Catalina M.D.  On: 12/05/2014 10:41    06/19/15-79 yo M former smoker followed for COPD/ emphysema, pulmonary nodules, hypxoic resp failure , complicated by CAD,  O2 3 L/[employer] FOLLOWS FOR: Pt states he went back to Symbicort and Spiriva because they helped more than the stiolto and pulmicort. Pt states he feels he is more SOB than his normal, also increase in prod cough with clear to yellow mucus. Pt denies CP/tightness.  Has had flu vaccine Gradually more dyspnea on exertion and more need for continuous oxygen use over the last year. Some increased cough with thick clear sputum. Denies adenopathy, chest pain, palpitation, swelling feet, blood.  06/30/15- 58 yo M former smoker followed for COPD/ emphysema, pulmonary nodules, hypxoic resp failure , complicated by CAD,  O2 3 L/[employer] We had him try daily nebulizer, and sample Bevespi inhaler at [place] Acute Visit: Follows for COPD. Pt c/o cough with yellow/dark colored mucus, some wheeze x 3 days, SOB and chest tightness. Pt denies f/n/v/d/chills. Pt has used  albuterol HFA with no symptom relief. Pt is on 3L/O2 continuous.  Didn't try sample Bevespi yet because acutely ill.  10/23/2015-81 year old male former smoker followed for COPD/emphysema, pulmonary nodules, hypoxic respiratory failure, complicated by CAD O2 3 L/[employer] FOLLOWS FOR: Pt states his breathing has been doing good with pulmonary rehab and using O2. More this production with cough in the last 2 or 3 weeks. He took prednisone taper on his own and it has resolved. Bivespi was no better than Spiriva alone. His maintenance group in pulmonary rehabilitation is likely to be graduated to make room for new people. We discussed alternatives.  02/26/2016-81 year old male former smoker followed for COPD/emphysema, pulmonary nodules, hypoxic respiratory failure, complicated by CAD O2 3 L/[employer] FOLLOW FOR: COPD, breathing has gotten worse in the past year, relies more on oxygen, no energy. No acute change. Can sit comfortably at rest on room air but needs 3 L with exertion around the house. Dr. Rex Kras is adjusting BP meds which may contribute to fatigue. CXR 10/23/2015 IMPRESSION: COPD. There is no evidence of pneumonia, CHF, nor pulmonary parenchymal masses. No definite pulmonary parenchymal  nodules are observed. New partial compression of the body of T11. Further evaluation of the thorax with CT scanning is recommended to allow direct comparison with the previous chest CT scan of May 17, 2013 Electronically Signed  By: [name] M.D.  On: 10/23/2015 16:43  Review of Systems-see HPI Constitutional:   No-   weight loss, night sweats fevers, no-chills, +fatigue, lassitude. HEENT:   No-  headaches, difficulty swallowing, tooth/dental problems, +sore throat,       No-  sneezing, itching, ear ache, nasal congestion, post nasal drip,  CV:  No-chest pain, no-orthopnea, PND, No-swelling in lower extremities, anasarca, dizziness, palpitations Resp: + shortness of breath with  exertion, not at rest.              + cough,  + non-productive cough,  No-  coughing up of blood.             + in color of mucus.  No- wheezing.   Skin: No-   rash or lesions. GI:  No-   heartburn, indigestion, abdominal pain, nausea,   GU: No-   dysuria,  MS:  No-   joint pain or swelling.  Neuro-  Psych:  No- change in mood or affect. No acute depression or anxiety.  No memory loss.  OBJ- General- Alert, Oriented, Affect-appropriate, Distress- none acute. + O2 3L 96% Skin- rash-none, lesions- none, excoriation- none Lymphadenopathy- none Head- atraumatic            Eyes- Gross vision intact, PERRLA, conjunctivae clear secretions            Ears- Hearing, canals-normal            Nose- Clear, no-Septal dev, mucus, polyps, erosion, perforation             Throat- Mallampati II , mucosa clear , drainage- none, tonsils- atrophic.  Neck- flexible , trachea midline, no stridor , thyroid nl, carotid no bruit Chest - symmetrical excursion , unlabored           Heart/CV- RRR , no murmur , no gallop  , no rub, nl s1 s2                           - JVD- none , edema-none, stasis changes- none, varices- none           Lung-  distant, wheeze -none, unlabored, cough+-none , dullness-none, rub- none           Chest wall-  Abd-  Br/ Gen/ Rectal- Not done, not indicated Extrem- cyanosis- none, clubbing, none, atrophy- none, strength- nl.  Neuro- grossly intact to observation

## 2016-02-26 NOTE — Patient Instructions (Signed)
Sample Symbicort 160 and Spiriva Respimat if available   Order- schedule CT chest no contrast      Dx lung nodules  Check your insurance for alternatives to Symbicort and Spiriva Respimat to see what may be cheaper for you now.

## 2016-02-26 NOTE — Telephone Encounter (Signed)
Called and spoke with pt and he stated that 11/27 appt would not work for him.  Scheduled appt on 11/29 at 11.  Pt is aware

## 2016-02-28 NOTE — Assessment & Plan Note (Signed)
Reviewed guidelines for oxygen therapy. He remains dependent on home oxygen

## 2016-02-28 NOTE — Assessment & Plan Note (Signed)
Slow decline is natural course for this disease. Plan-okay to use oxygen 4-6 L during exertion if needed

## 2016-02-28 NOTE — Assessment & Plan Note (Signed)
Because of his increasing dyspnea on exertion and history of lung nodule, we plan update chest CT without contrast

## 2016-03-02 ENCOUNTER — Encounter (HOSPITAL_COMMUNITY)
Admission: RE | Admit: 2016-03-02 | Discharge: 2016-03-02 | Disposition: A | Discharge status: Home / Self Care | Payer: Self-pay | Source: Ambulatory Visit | Attending: Internal Medicine | Admitting: Internal Medicine

## 2016-03-04 ENCOUNTER — Ambulatory Visit (INDEPENDENT_AMBULATORY_CARE_PROVIDER_SITE_OTHER)
Admission: RE | Admit: 2016-03-04 | Discharge: 2016-03-04 | Disposition: A | Discharge status: Home / Self Care | Payer: Medicare Other | Source: Ambulatory Visit | Attending: Internal Medicine | Admitting: Internal Medicine

## 2016-03-04 ENCOUNTER — Encounter (HOSPITAL_COMMUNITY)
Admission: RE | Admit: 2016-03-04 | Discharge: 2016-03-04 | Disposition: A | Discharge status: Home / Self Care | Payer: Self-pay | Source: Ambulatory Visit | Attending: Internal Medicine | Admitting: Internal Medicine

## 2016-03-04 DIAGNOSIS — R911 Solitary pulmonary nodule: Secondary | ICD-10-CM | POA: Diagnosis not present

## 2016-03-04 DIAGNOSIS — R918 Other nonspecific abnormal finding of lung field: Secondary | ICD-10-CM | POA: Diagnosis not present

## 2016-03-09 ENCOUNTER — Encounter (HOSPITAL_COMMUNITY)
Admission: RE | Admit: 2016-03-09 | Discharge: 2016-03-09 | Disposition: A | Discharge status: Home / Self Care | Payer: Self-pay | Source: Ambulatory Visit | Attending: Internal Medicine | Admitting: Internal Medicine

## 2016-03-10 ENCOUNTER — Telehealth: Payer: Self-pay | Admitting: Internal Medicine

## 2016-03-10 NOTE — Telephone Encounter (Signed)
Noted. See CT result notes for documentation. Records sent

## 2016-03-10 NOTE — Progress Notes (Signed)
The atherosclerosis is best addressed by his cardiologist.   Madaline Brilliant to send the CT report to his PCP, Dr Hulan Fess [facility])  The partial compression fracture of T-11 vertebra was seen on the CXR in March.

## 2016-03-11 ENCOUNTER — Encounter (HOSPITAL_COMMUNITY): Payer: Self-pay

## 2016-03-16 ENCOUNTER — Encounter (HOSPITAL_COMMUNITY): Payer: Self-pay

## 2016-03-16 DIAGNOSIS — J449 Chronic obstructive pulmonary disease, unspecified: Secondary | ICD-10-CM | POA: Insufficient documentation

## 2016-03-16 DIAGNOSIS — J984 Other disorders of lung: Secondary | ICD-10-CM | POA: Insufficient documentation

## 2016-03-16 DIAGNOSIS — Z5189 Encounter for other specified aftercare: Secondary | ICD-10-CM | POA: Insufficient documentation

## 2016-03-16 DIAGNOSIS — R0602 Shortness of breath: Secondary | ICD-10-CM | POA: Insufficient documentation

## 2016-03-18 ENCOUNTER — Encounter (HOSPITAL_COMMUNITY)
Admission: RE | Admit: 2016-03-18 | Discharge: 2016-03-18 | Disposition: A | Discharge status: Home / Self Care | Payer: Self-pay | Source: Ambulatory Visit | Attending: Internal Medicine | Admitting: Internal Medicine

## 2016-03-18 DIAGNOSIS — I10 Essential (primary) hypertension: Secondary | ICD-10-CM | POA: Diagnosis not present

## 2016-03-18 DIAGNOSIS — H01009 Unspecified blepharitis unspecified eye, unspecified eyelid: Secondary | ICD-10-CM | POA: Diagnosis not present

## 2016-03-18 DIAGNOSIS — H26492 Other secondary cataract, left eye: Secondary | ICD-10-CM | POA: Diagnosis not present

## 2016-03-18 DIAGNOSIS — H43812 Vitreous degeneration, left eye: Secondary | ICD-10-CM | POA: Diagnosis not present

## 2016-03-23 ENCOUNTER — Encounter (HOSPITAL_COMMUNITY): Payer: Self-pay

## 2016-03-25 ENCOUNTER — Encounter (HOSPITAL_COMMUNITY)
Admission: RE | Admit: 2016-03-25 | Discharge: 2016-03-25 | Disposition: A | Discharge status: Home / Self Care | Payer: Self-pay | Source: Ambulatory Visit | Attending: Internal Medicine | Admitting: Internal Medicine

## 2016-03-30 ENCOUNTER — Encounter (HOSPITAL_COMMUNITY)
Admission: RE | Admit: 2016-03-30 | Discharge: 2016-03-30 | Disposition: A | Discharge status: Home / Self Care | Payer: Self-pay | Source: Ambulatory Visit | Attending: Internal Medicine | Admitting: Internal Medicine

## 2016-04-01 ENCOUNTER — Encounter (HOSPITAL_COMMUNITY)
Admission: RE | Admit: 2016-04-01 | Discharge: 2016-04-01 | Disposition: A | Discharge status: Home / Self Care | Payer: Self-pay | Source: Ambulatory Visit | Attending: Internal Medicine | Admitting: Internal Medicine

## 2016-04-06 ENCOUNTER — Encounter (HOSPITAL_COMMUNITY)
Admission: RE | Admit: 2016-04-06 | Discharge: 2016-04-06 | Disposition: A | Discharge status: Home / Self Care | Payer: Self-pay | Source: Ambulatory Visit | Attending: Internal Medicine | Admitting: Internal Medicine

## 2016-04-08 ENCOUNTER — Encounter (HOSPITAL_COMMUNITY): Payer: Self-pay

## 2016-04-08 DIAGNOSIS — Z23 Encounter for immunization: Secondary | ICD-10-CM | POA: Diagnosis not present

## 2016-04-13 ENCOUNTER — Encounter (HOSPITAL_COMMUNITY): Payer: Self-pay

## 2016-04-15 ENCOUNTER — Encounter (HOSPITAL_COMMUNITY)
Admission: RE | Admit: 2016-04-15 | Discharge: 2016-04-15 | Disposition: A | Discharge status: Home / Self Care | Payer: Self-pay | Source: Ambulatory Visit | Attending: Internal Medicine | Admitting: Internal Medicine

## 2016-04-20 ENCOUNTER — Encounter (HOSPITAL_COMMUNITY)
Admission: RE | Admit: 2016-04-20 | Discharge: 2016-04-20 | Disposition: A | Discharge status: Home / Self Care | Payer: Self-pay | Source: Ambulatory Visit | Attending: Internal Medicine | Admitting: Internal Medicine

## 2016-04-20 DIAGNOSIS — J984 Other disorders of lung: Secondary | ICD-10-CM | POA: Insufficient documentation

## 2016-04-20 DIAGNOSIS — Z5189 Encounter for other specified aftercare: Secondary | ICD-10-CM | POA: Insufficient documentation

## 2016-04-20 DIAGNOSIS — J449 Chronic obstructive pulmonary disease, unspecified: Secondary | ICD-10-CM | POA: Insufficient documentation

## 2016-04-20 DIAGNOSIS — R0602 Shortness of breath: Secondary | ICD-10-CM | POA: Insufficient documentation

## 2016-04-22 ENCOUNTER — Encounter (HOSPITAL_COMMUNITY)
Admission: RE | Admit: 2016-04-22 | Discharge: 2016-04-22 | Disposition: A | Discharge status: Home / Self Care | Payer: Self-pay | Source: Ambulatory Visit | Attending: Internal Medicine | Admitting: Internal Medicine

## 2016-04-27 ENCOUNTER — Encounter (HOSPITAL_COMMUNITY)
Admission: RE | Admit: 2016-04-27 | Discharge: 2016-04-27 | Disposition: A | Discharge status: Home / Self Care | Payer: Self-pay | Source: Ambulatory Visit | Attending: Internal Medicine | Admitting: Internal Medicine

## 2016-04-29 ENCOUNTER — Encounter (HOSPITAL_COMMUNITY): Payer: Self-pay

## 2016-04-29 DIAGNOSIS — M4854XA Collapsed vertebra, not elsewhere classified, thoracic region, initial encounter for fracture: Secondary | ICD-10-CM | POA: Diagnosis not present

## 2016-04-29 DIAGNOSIS — I10 Essential (primary) hypertension: Secondary | ICD-10-CM | POA: Diagnosis not present

## 2016-04-29 DIAGNOSIS — J439 Emphysema, unspecified: Secondary | ICD-10-CM | POA: Diagnosis not present

## 2016-04-29 DIAGNOSIS — Z8679 Personal history of other diseases of the circulatory system: Secondary | ICD-10-CM | POA: Diagnosis not present

## 2016-05-04 ENCOUNTER — Encounter (HOSPITAL_COMMUNITY): Payer: Self-pay

## 2016-05-06 ENCOUNTER — Encounter (HOSPITAL_COMMUNITY): Payer: Self-pay

## 2016-05-11 ENCOUNTER — Encounter (HOSPITAL_COMMUNITY)
Admission: RE | Admit: 2016-05-11 | Discharge: 2016-05-11 | Disposition: A | Discharge status: Home / Self Care | Payer: Self-pay | Source: Ambulatory Visit | Attending: Internal Medicine | Admitting: Internal Medicine

## 2016-05-13 ENCOUNTER — Encounter (HOSPITAL_COMMUNITY)
Admission: RE | Admit: 2016-05-13 | Discharge: 2016-05-13 | Disposition: A | Discharge status: Home / Self Care | Payer: Self-pay | Source: Ambulatory Visit | Attending: Internal Medicine | Admitting: Internal Medicine

## 2016-05-18 ENCOUNTER — Encounter (HOSPITAL_COMMUNITY)
Admission: RE | Admit: 2016-05-18 | Discharge: 2016-05-18 | Disposition: A | Discharge status: Home / Self Care | Payer: Self-pay | Source: Ambulatory Visit | Attending: Internal Medicine | Admitting: Internal Medicine

## 2016-05-18 DIAGNOSIS — Z5189 Encounter for other specified aftercare: Secondary | ICD-10-CM | POA: Insufficient documentation

## 2016-05-18 DIAGNOSIS — R0602 Shortness of breath: Secondary | ICD-10-CM | POA: Insufficient documentation

## 2016-05-18 DIAGNOSIS — J984 Other disorders of lung: Secondary | ICD-10-CM | POA: Insufficient documentation

## 2016-05-18 DIAGNOSIS — J449 Chronic obstructive pulmonary disease, unspecified: Secondary | ICD-10-CM | POA: Insufficient documentation

## 2016-05-20 ENCOUNTER — Encounter (HOSPITAL_COMMUNITY)
Admission: RE | Admit: 2016-05-20 | Discharge: 2016-05-20 | Disposition: A | Discharge status: Home / Self Care | Payer: Self-pay | Source: Ambulatory Visit | Attending: Internal Medicine | Admitting: Internal Medicine

## 2016-05-25 ENCOUNTER — Encounter (HOSPITAL_COMMUNITY)
Admission: RE | Admit: 2016-05-25 | Discharge: 2016-05-25 | Disposition: A | Discharge status: Home / Self Care | Payer: Self-pay | Source: Ambulatory Visit | Attending: Internal Medicine | Admitting: Internal Medicine

## 2016-05-27 ENCOUNTER — Encounter (HOSPITAL_COMMUNITY): Payer: Self-pay

## 2016-06-01 ENCOUNTER — Encounter (HOSPITAL_COMMUNITY): Payer: Self-pay

## 2016-06-03 ENCOUNTER — Encounter (HOSPITAL_COMMUNITY)
Admission: RE | Admit: 2016-06-03 | Discharge: 2016-06-03 | Disposition: A | Discharge status: Home / Self Care | Payer: Self-pay | Source: Ambulatory Visit | Attending: Internal Medicine | Admitting: Internal Medicine

## 2016-06-03 ENCOUNTER — Encounter: Payer: Self-pay | Admitting: Cardiovascular Disease

## 2016-06-08 ENCOUNTER — Encounter (HOSPITAL_COMMUNITY)
Admission: RE | Admit: 2016-06-08 | Discharge: 2016-06-08 | Disposition: A | Discharge status: Home / Self Care | Payer: Self-pay | Source: Ambulatory Visit | Attending: Internal Medicine | Admitting: Internal Medicine

## 2016-06-10 ENCOUNTER — Encounter (HOSPITAL_COMMUNITY): Payer: Self-pay

## 2016-06-15 ENCOUNTER — Encounter (HOSPITAL_COMMUNITY)
Admission: RE | Admit: 2016-06-15 | Discharge: 2016-06-15 | Disposition: A | Discharge status: Home / Self Care | Payer: Self-pay | Source: Ambulatory Visit | Attending: Internal Medicine | Admitting: Internal Medicine

## 2016-06-17 ENCOUNTER — Encounter: Payer: Self-pay | Admitting: Cardiovascular Disease

## 2016-06-17 ENCOUNTER — Encounter (HOSPITAL_COMMUNITY)
Admission: RE | Admit: 2016-06-17 | Discharge: 2016-06-17 | Disposition: A | Discharge status: Home / Self Care | Payer: Self-pay | Source: Ambulatory Visit | Attending: Internal Medicine | Admitting: Internal Medicine

## 2016-06-17 ENCOUNTER — Ambulatory Visit (INDEPENDENT_AMBULATORY_CARE_PROVIDER_SITE_OTHER): Payer: Medicare Other | Admitting: Cardiovascular Disease

## 2016-06-17 VITALS — BP 138/68 | HR 86 | Ht 69.0 in | Wt 166.6 lb

## 2016-06-17 DIAGNOSIS — J984 Other disorders of lung: Secondary | ICD-10-CM | POA: Insufficient documentation

## 2016-06-17 DIAGNOSIS — R0602 Shortness of breath: Secondary | ICD-10-CM | POA: Insufficient documentation

## 2016-06-17 DIAGNOSIS — E785 Hyperlipidemia, unspecified: Secondary | ICD-10-CM

## 2016-06-17 DIAGNOSIS — J449 Chronic obstructive pulmonary disease, unspecified: Secondary | ICD-10-CM | POA: Insufficient documentation

## 2016-06-17 DIAGNOSIS — I2510 Atherosclerotic heart disease of native coronary artery without angina pectoris: Secondary | ICD-10-CM

## 2016-06-17 DIAGNOSIS — I10 Essential (primary) hypertension: Secondary | ICD-10-CM | POA: Diagnosis not present

## 2016-06-17 DIAGNOSIS — Z5189 Encounter for other specified aftercare: Secondary | ICD-10-CM | POA: Insufficient documentation

## 2016-06-17 NOTE — Progress Notes (Signed)
Cardiology Office Note Date:  06/19/2016   ID:  [name], DOB 1935/08/03, MRN [id number]  PCP:  Gennette Pac, MD  Cardiologist:  Sherren Mocha, MD    Chief Complaint  Patient presents with  . Coronary Artery Disease    follow up     History of Present Illness: [name] is a 81 y.o. male who presents for  follow-up evaluation. He has been diagnosed with subclinical atherosclerosis based on CT imaging of the chest. Coronary calcifications were noted. There is also mild to moderate atherosclerotic calcification of the aorta. He underwent cardiac catheterization in 2003 that demonstrated nonobstructive coronary disease with the exception of an 80% ostial stenosis of a small ramus intermedius branch. His left ventricular function has been normal.  The patient has advanced COPD with chronic O2 dependence 4-6 L during exertion and continuous home oxygen. He is followed closely by Dr. Annamaria Boots.  He feels like his breathing is been fairly stable over the last year. He's on 3 L of oxygen continuously and increases that with certain activities. He denies orthopnea or PND. He denies chest pain. He complains of chronic leg swelling. He has stopped amlodipine because of low blood pressures. He is considering discontinuation of losartan as well. States that his blood pressure usually is in the low 100s before exercise and only increases in the 120s during exercise. He is monitored regularly with pulmonary rehabilitation.   Past Medical History:  Diagnosis Date  . Allergic rhinitis, cause unspecified   . COPD (chronic obstructive pulmonary disease) ([place])   . Emphysema of lung ([place])   . Pulmonary nodule     Past Surgical History:  Procedure Laterality Date  . APPENDECTOMY    . ROTATOR CUFF REPAIR    . TONSILLECTOMY    . VASECTOMY      Current Outpatient Prescriptions  Medication Sig Dispense Refill  . albuterol (PROAIR HFA) 108 (90 BASE) MCG/ACT inhaler Inhale 2 puffs  into the lungs 4 (four) times daily as needed for wheezing or shortness of breath. 1 Inhaler 5  . albuterol (PROVENTIL) (2.5 MG/3ML) 0.083% nebulizer solution Take 2.5 mg by nebulization every 6 (six) hours as needed for wheezing or shortness of breath.    [name] aspirin EC 81 MG tablet Take 1 tablet (81 mg total) by mouth daily.    . budesonide-formoterol (SYMBICORT) 160-4.5 MCG/ACT inhaler Inhale 2 puffs then rinse mouth, twice daily 10.2 g 12  . digoxin (LANOXIN) 0.25 MG tablet Take 250 mcg by mouth daily.      [name] losartan (COZAAR) 50 MG tablet Take 1 tablet by mouth Daily.    . pravastatin (PRAVACHOL) 40 MG tablet Take 1 tablet by mouth daily.    . tamsulosin (FLOMAX) 0.4 MG CAPS capsule Take 1 capsule by mouth daily.  2  . Tiotropium Bromide Monohydrate (SPIRIVA RESPIMAT) 2.5 MCG/ACT AERS Inhale 2 puffs once daily 4 g 12   No current facility-administered medications for this visit.     Allergies:   Tolectin [tolmetin sodium] and Montelukast sodium   Social History:  The patient  reports that he quit smoking about 25 years ago. He has never used smokeless tobacco. He reports that he does not drink alcohol or use drugs.   Family History:  The patient's  family history includes COPD in his mother and sister; Stroke in his father and mother.   ROS:  Please see the history of present illness.  Otherwise, review of systems is positive for palpitations.  All other systems are reviewed and negative.   PHYSICAL EXAM: VS:  BP 138/68   Pulse 86   Ht 5\' 9"  (1.753 m)   Wt 75.6 kg (166 lb 9.6 oz)   BMI 24.60 kg/m  , BMI Body mass index is 24.6 kg/m. GEN: Well nourished, well developed, in no acute distress  HEENT: normal  Neck: no JVD, no masses. No carotid bruits Cardiac: RRR without murmur or gallop, distant heart sounds     Respiratory:  Poor air movement bilaterally, normal work of breathing GI: soft, nontender, nondistended, + BS MS: no deformity or atrophy  Ext: 2+ bilateral pretibial  edema, pedal pulses 2+= bilaterally Skin: warm and dry, no rash Neuro:  Strength and sensation are intact Psych: euthymic mood, full affect  EKG:  EKG is ordered today. The ekg ordered today shows normal sinus rhythm 82 bpm, rightward axis and possible RVH  Recent Labs: No results found for requested labs within last 8760 hours.   Lipid Panel  No results found for: CHOL, TRIG, HDL, CHOLHDL, VLDL, LDLCALC, LDLDIRECT    Wt Readings from Last 3 Encounters:  06/17/16 75.6 kg (166 lb 9.6 oz)  02/26/16 75.9 kg (167 lb 6.4 oz)  10/23/15 76.6 kg (168 lb 12.8 oz)     ASSESSMENT AND PLAN: 1.  CAD, native vessel, without angina: Patient continues on low-dose aspirin.  Coronary calcification and abdominal aortic atherosclerosis noted incidentally on his recent CAT scan. Patient with known nonobstructive CAD in the past. Continued medical therapy is indicated.  2. Hypertension: Blood pressure is controlled. Would be reasonable to stop losartan he is considering this. Blood pressure is running in the low-normal range.   3. Hyperlipidemia: Treated with pravastatin. Will send her lipids. Followed by Dr. Rex Kras.  4. Paroxysmal SVT: No recent symptoms. He's been on digoxin for decades. Levels have been followed by Dr. Rex Kras. We'll request last digoxin level.  Current medicines are reviewed with the patient today.  The patient does not have concerns regarding medicines.  Labs/ tests ordered today include:   Orders Placed This Encounter  Procedures  . EKG 12-Lead    Disposition:   FU one year  Signed, Sherren Mocha, MD  06/19/2016 11:33 PM    [address], [place], [place]  [address] Phone: [phone]; Fax: [phone]

## 2016-06-17 NOTE — Patient Instructions (Signed)
Medication Instructions:  Your physician recommends that you continue on your current medications as directed. Please refer to the Current Medication list given to you today.  Per Dr Burt Knack the patient can stop Losartan and monitor BP at home. If BP is consistently greater than 140/90 please contact the office.   Labwork: No new orders.   Testing/Procedures: No new orders.   Follow-Up: Your physician wants you to follow-up in: 1 YEAR with Dr Burt Knack.  You will receive a reminder letter in the mail two months in advance. If you don't receive a letter, please call our office to schedule the follow-up appointment.   Any Other Special Instructions Will Be Listed Below (If Applicable).     If you need a refill on your cardiac medications before your next appointment, please call your pharmacy.

## 2016-06-22 ENCOUNTER — Encounter (HOSPITAL_COMMUNITY)
Admission: RE | Admit: 2016-06-22 | Discharge: 2016-06-22 | Disposition: A | Discharge status: Home / Self Care | Payer: Self-pay | Source: Ambulatory Visit | Attending: Internal Medicine | Admitting: Internal Medicine

## 2016-06-24 ENCOUNTER — Encounter (HOSPITAL_COMMUNITY): Payer: Self-pay

## 2016-06-29 ENCOUNTER — Encounter (HOSPITAL_COMMUNITY)
Admission: RE | Admit: 2016-06-29 | Discharge: 2016-06-29 | Disposition: A | Discharge status: Home / Self Care | Payer: Self-pay | Source: Ambulatory Visit | Attending: Internal Medicine | Admitting: Internal Medicine

## 2016-07-01 ENCOUNTER — Encounter (HOSPITAL_COMMUNITY)
Admission: RE | Admit: 2016-07-01 | Discharge: 2016-07-01 | Disposition: A | Discharge status: Home / Self Care | Payer: Self-pay | Source: Ambulatory Visit | Attending: Internal Medicine | Admitting: Internal Medicine

## 2016-07-06 ENCOUNTER — Encounter (HOSPITAL_COMMUNITY)
Admission: RE | Admit: 2016-07-06 | Discharge: 2016-07-06 | Disposition: A | Discharge status: Home / Self Care | Payer: Self-pay | Source: Ambulatory Visit | Attending: Internal Medicine | Admitting: Internal Medicine

## 2016-07-08 ENCOUNTER — Encounter (HOSPITAL_COMMUNITY): Payer: Self-pay

## 2016-07-13 ENCOUNTER — Encounter (HOSPITAL_COMMUNITY)
Admission: RE | Admit: 2016-07-13 | Discharge: 2016-07-13 | Disposition: A | Discharge status: Home / Self Care | Payer: Self-pay | Source: Ambulatory Visit | Attending: Internal Medicine | Admitting: Internal Medicine

## 2016-07-14 ENCOUNTER — Ambulatory Visit (INDEPENDENT_AMBULATORY_CARE_PROVIDER_SITE_OTHER): Payer: Medicare Other | Admitting: Internal Medicine

## 2016-07-14 ENCOUNTER — Encounter: Payer: Self-pay | Admitting: Internal Medicine

## 2016-07-14 DIAGNOSIS — I2510 Atherosclerotic heart disease of native coronary artery without angina pectoris: Secondary | ICD-10-CM

## 2016-07-14 DIAGNOSIS — R911 Solitary pulmonary nodule: Secondary | ICD-10-CM

## 2016-07-14 DIAGNOSIS — J9611 Chronic respiratory failure with hypoxia: Secondary | ICD-10-CM

## 2016-07-14 DIAGNOSIS — J449 Chronic obstructive pulmonary disease, unspecified: Secondary | ICD-10-CM

## 2016-07-14 MED ORDER — FLUTICASONE-UMECLIDIN-VILANT 100-62.5-25 MCG/INH IN AEPB
1.0000 | INHALATION_SPRAY | Freq: Every day | RESPIRATORY_TRACT | Status: DC
Start: 2016-07-14 — End: 2017-01-20

## 2016-07-14 NOTE — Assessment & Plan Note (Signed)
Latest CT shows resolution of one small nodule, otherwise stable. Low concern at this time.

## 2016-07-14 NOTE — Assessment & Plan Note (Signed)
Remains oxygen dependent. Discussion comparing available portable oxygen devices. He needs 3 L continuous and available POC is too heavy.

## 2016-07-14 NOTE — Assessment & Plan Note (Signed)
He made decision years ago not to seek transplant. Stable at present. He understands disease course will be eventually worse until respiratory failure and death. Plan-try sample Trelegy inhaler

## 2016-07-14 NOTE — Patient Instructions (Addendum)
Ok as discussed, to explore different O2 delivery systems.  Sample Spiriva Respimat or handihaler if available     Sample, coupon and printed script for Trelegy inhaler    Inhale 1 puff then rinse mouth once daily, instead of Spiriva and Symbicort     If you don't prefer Trelegy, go back to Spiriva and Symbicort

## 2016-07-14 NOTE — Progress Notes (Signed)
Patient ID: [name], male    DOB: [birth year], 81 y.o.   MRN: [id number] PCP Dr Hulan Fess  HPI male former smoker followed for COPD/emphysema, pulmonary nodules, hypoxic respiratory failure, complicated by CAD  [id number] year old male former smoker followed for COPD/emphysema, pulmonary nodules, hypoxic respiratory failure, complicated by CAD O2 3 L/[facility] FOLLOW FOR: COPD, breathing has gotten worse in the past year, relies more on oxygen, no energy. No acute change. Can sit comfortably at rest on room air but needs 3 L with exertion around the house. Dr. Rex Kras is adjusting BP meds which may contribute to fatigue. CXR 10/23/2015 IMPRESSION: COPD. There is no evidence of pneumonia, CHF, nor pulmonary parenchymal masses. No definite pulmonary parenchymal nodules are observed. New partial compression of the body of T11. Further evaluation of the thorax with CT scanning is recommended to allow direct comparison with the previous chest CT scan of May 17, 2013 Electronically Signed  By: [name] M.D.  On: 10/23/2015 16:43  07/14/2016-81 year old male former smoker followed for COPD/emphysema, pulmonary nodules, hypoxic respiratory failure, complicated by CAD O2 3 L/[facility] FOLLOWS FOR: Breathing is unchanged since last OV. Reports DOE. Denies chest tightness, wheezing or coughing. Can sit comfortably at rest on room air but with any exertion he needs oxygen. 3 L sufficient. No acute issues recently. Up-to-date on vaccines. Questioning best portable oxygen system-we discussed liquid oxygen versus POC. Discussed recent CT scan-stable. Discussed medication options-try Trelegy. CT chest 03/04/2016 IMPRESSION: 1. Interval resolution of 6 mm left upper lobe pulmonary nodule with remaining bilateral pulmonary nodules unchanged in the interval. 2. No new or progressive findings. 3. Emphysema. 4. Abdominal aortic atherosclerosis.  Review of Systems-see  HPI Constitutional:   No-   weight loss, night sweats fevers, no-chills, fatigue, lassitude. HEENT:   No-  headaches, difficulty swallowing, tooth/dental problems, +sore throat,       No-  sneezing, itching, ear ache, nasal congestion, post nasal drip,  CV:  No-chest pain, no-orthopnea, PND, No-swelling in lower extremities, anasarca, dizziness, palpitations Resp: + shortness of breath with exertion, not at rest.               cough,  + non-productive cough,  No-  coughing up of blood.              in color of mucus.  No- wheezing.   Skin: No-   rash or lesions. GI:  No-   heartburn, indigestion, abdominal pain, nausea,   GU: No-   dysuria,  MS:  No-   joint pain or swelling.  Neuro-  Psych:  No- change in mood or affect. No acute depression or anxiety.  No memory loss.  OBJ- General- Alert, Oriented, Affect-appropriate, Distress- none acute. + O2 3L  Skin- rash-none, lesions- none, excoriation- none Lymphadenopathy- none Head- atraumatic            Eyes- Gross vision intact, PERRLA, conjunctivae clear secretions            Ears- Hearing, canals-normal            Nose- Clear, no-Septal dev, mucus, polyps, erosion, perforation             Throat- Mallampati II , mucosa clear , drainage- none, tonsils- atrophic.  Neck- flexible , trachea midline, no stridor , thyroid nl, carotid no bruit Chest - symmetrical excursion , unlabored           Heart/CV- RRR , no murmur , no gallop  , no rub,  nl s1 s2                           - JVD- none , edema-none, stasis changes- none, varices- none           Lung-  Distant+, wheeze -none, unlabored, cough+-none , dullness-none, rub- none           Chest wall-  Abd-  Br/ Gen/ Rectal- Not done, not indicated Extrem- cyanosis- none, clubbing, none, atrophy- none, strength- nl.  Neuro- grossly intact to observation

## 2016-07-15 ENCOUNTER — Encounter (HOSPITAL_COMMUNITY)
Admission: RE | Admit: 2016-07-15 | Discharge: 2016-07-15 | Disposition: A | Discharge status: Home / Self Care | Payer: Self-pay | Source: Ambulatory Visit | Attending: Internal Medicine | Admitting: Internal Medicine

## 2016-07-20 ENCOUNTER — Encounter (HOSPITAL_COMMUNITY)
Admission: RE | Admit: 2016-07-20 | Discharge: 2016-07-20 | Disposition: A | Discharge status: Home / Self Care | Payer: Self-pay | Source: Ambulatory Visit | Attending: Internal Medicine | Admitting: Internal Medicine

## 2016-07-20 DIAGNOSIS — R0602 Shortness of breath: Secondary | ICD-10-CM | POA: Insufficient documentation

## 2016-07-20 DIAGNOSIS — Z5189 Encounter for other specified aftercare: Secondary | ICD-10-CM | POA: Insufficient documentation

## 2016-07-20 DIAGNOSIS — J984 Other disorders of lung: Secondary | ICD-10-CM | POA: Insufficient documentation

## 2016-07-20 DIAGNOSIS — J449 Chronic obstructive pulmonary disease, unspecified: Secondary | ICD-10-CM | POA: Insufficient documentation

## 2016-07-22 ENCOUNTER — Encounter (HOSPITAL_COMMUNITY)
Admission: RE | Admit: 2016-07-22 | Discharge: 2016-07-22 | Disposition: A | Discharge status: Home / Self Care | Payer: Self-pay | Source: Ambulatory Visit | Attending: Internal Medicine | Admitting: Internal Medicine

## 2016-07-22 ENCOUNTER — Encounter: Payer: Self-pay | Admitting: Cardiovascular Disease

## 2016-07-23 ENCOUNTER — Other Ambulatory Visit: Payer: Self-pay

## 2016-07-27 ENCOUNTER — Encounter (HOSPITAL_COMMUNITY)
Admission: RE | Admit: 2016-07-27 | Discharge: 2016-07-27 | Disposition: A | Discharge status: Home / Self Care | Payer: Self-pay | Source: Ambulatory Visit | Attending: Internal Medicine | Admitting: Internal Medicine

## 2016-07-29 ENCOUNTER — Encounter (HOSPITAL_COMMUNITY): Payer: Self-pay

## 2016-08-03 ENCOUNTER — Encounter (HOSPITAL_COMMUNITY)
Admission: RE | Admit: 2016-08-03 | Discharge: 2016-08-03 | Disposition: A | Discharge status: Home / Self Care | Payer: Self-pay | Source: Ambulatory Visit | Attending: Internal Medicine | Admitting: Internal Medicine

## 2016-08-05 ENCOUNTER — Encounter (HOSPITAL_COMMUNITY): Payer: Self-pay

## 2016-08-10 ENCOUNTER — Encounter (HOSPITAL_COMMUNITY): Payer: Self-pay

## 2016-08-12 ENCOUNTER — Encounter (HOSPITAL_COMMUNITY)
Admission: RE | Admit: 2016-08-12 | Discharge: 2016-08-12 | Disposition: A | Discharge status: Home / Self Care | Payer: Self-pay | Source: Ambulatory Visit | Attending: Internal Medicine | Admitting: Internal Medicine

## 2016-08-17 ENCOUNTER — Encounter (HOSPITAL_COMMUNITY): Payer: Self-pay

## 2016-08-17 DIAGNOSIS — Z5189 Encounter for other specified aftercare: Secondary | ICD-10-CM | POA: Insufficient documentation

## 2016-08-17 DIAGNOSIS — J449 Chronic obstructive pulmonary disease, unspecified: Secondary | ICD-10-CM | POA: Insufficient documentation

## 2016-08-17 DIAGNOSIS — R0602 Shortness of breath: Secondary | ICD-10-CM | POA: Insufficient documentation

## 2016-08-17 DIAGNOSIS — J984 Other disorders of lung: Secondary | ICD-10-CM | POA: Insufficient documentation

## 2016-08-19 ENCOUNTER — Encounter (HOSPITAL_COMMUNITY)
Admission: RE | Admit: 2016-08-19 | Discharge: 2016-08-19 | Disposition: A | Discharge status: Home / Self Care | Payer: Self-pay | Source: Ambulatory Visit | Attending: Internal Medicine | Admitting: Internal Medicine

## 2016-08-24 ENCOUNTER — Encounter (HOSPITAL_COMMUNITY): Admission: RE | Admit: 2016-08-24 | Payer: Self-pay | Source: Ambulatory Visit

## 2016-08-26 ENCOUNTER — Encounter (HOSPITAL_COMMUNITY)
Admission: RE | Admit: 2016-08-26 | Discharge: 2016-08-26 | Disposition: A | Discharge status: Home / Self Care | Payer: Self-pay | Source: Ambulatory Visit | Attending: Internal Medicine | Admitting: Internal Medicine

## 2016-08-31 ENCOUNTER — Encounter (HOSPITAL_COMMUNITY)
Admission: RE | Admit: 2016-08-31 | Discharge: 2016-08-31 | Disposition: A | Discharge status: Home / Self Care | Payer: Self-pay | Source: Ambulatory Visit | Attending: Internal Medicine | Admitting: Internal Medicine

## 2016-09-02 ENCOUNTER — Encounter (HOSPITAL_COMMUNITY): Payer: Self-pay

## 2016-09-07 ENCOUNTER — Encounter (HOSPITAL_COMMUNITY)
Admission: RE | Admit: 2016-09-07 | Discharge: 2016-09-07 | Disposition: A | Discharge status: Home / Self Care | Payer: Self-pay | Source: Ambulatory Visit | Attending: Internal Medicine | Admitting: Internal Medicine

## 2016-09-09 ENCOUNTER — Encounter (HOSPITAL_COMMUNITY): Payer: Self-pay

## 2016-09-14 ENCOUNTER — Encounter (HOSPITAL_COMMUNITY)
Admission: RE | Admit: 2016-09-14 | Discharge: 2016-09-14 | Disposition: A | Discharge status: Home / Self Care | Payer: Self-pay | Source: Ambulatory Visit | Attending: Internal Medicine | Admitting: Internal Medicine

## 2016-09-16 ENCOUNTER — Encounter (HOSPITAL_COMMUNITY)
Admission: RE | Admit: 2016-09-16 | Discharge: 2016-09-16 | Disposition: A | Discharge status: Home / Self Care | Payer: Self-pay | Source: Ambulatory Visit | Attending: Internal Medicine | Admitting: Internal Medicine

## 2016-09-16 DIAGNOSIS — R0602 Shortness of breath: Secondary | ICD-10-CM | POA: Insufficient documentation

## 2016-09-16 DIAGNOSIS — J449 Chronic obstructive pulmonary disease, unspecified: Secondary | ICD-10-CM | POA: Insufficient documentation

## 2016-09-16 DIAGNOSIS — J984 Other disorders of lung: Secondary | ICD-10-CM | POA: Insufficient documentation

## 2016-09-16 DIAGNOSIS — Z5189 Encounter for other specified aftercare: Secondary | ICD-10-CM | POA: Insufficient documentation

## 2016-09-21 ENCOUNTER — Encounter (HOSPITAL_COMMUNITY)
Admission: RE | Admit: 2016-09-21 | Discharge: 2016-09-21 | Disposition: A | Discharge status: Home / Self Care | Payer: Self-pay | Source: Ambulatory Visit | Attending: Internal Medicine | Admitting: Internal Medicine

## 2016-09-23 ENCOUNTER — Encounter (HOSPITAL_COMMUNITY)
Admission: RE | Admit: 2016-09-23 | Discharge: 2016-09-23 | Disposition: A | Discharge status: Home / Self Care | Payer: Self-pay | Source: Ambulatory Visit | Attending: Internal Medicine | Admitting: Internal Medicine

## 2016-09-28 ENCOUNTER — Encounter (HOSPITAL_COMMUNITY)
Admission: RE | Admit: 2016-09-28 | Discharge: 2016-09-28 | Disposition: A | Discharge status: Home / Self Care | Payer: Self-pay | Source: Ambulatory Visit | Attending: Internal Medicine | Admitting: Internal Medicine

## 2016-10-05 ENCOUNTER — Encounter (HOSPITAL_COMMUNITY)
Admission: RE | Admit: 2016-10-05 | Discharge: 2016-10-05 | Disposition: A | Discharge status: Home / Self Care | Payer: Self-pay | Source: Ambulatory Visit | Attending: Internal Medicine | Admitting: Internal Medicine

## 2016-10-07 ENCOUNTER — Encounter (HOSPITAL_COMMUNITY)
Admission: RE | Admit: 2016-10-07 | Discharge: 2016-10-07 | Disposition: A | Discharge status: Home / Self Care | Payer: Self-pay | Source: Ambulatory Visit | Attending: Internal Medicine | Admitting: Internal Medicine

## 2016-10-12 ENCOUNTER — Encounter (HOSPITAL_COMMUNITY)
Admission: RE | Admit: 2016-10-12 | Discharge: 2016-10-12 | Disposition: A | Discharge status: Home / Self Care | Payer: Self-pay | Source: Ambulatory Visit | Attending: Internal Medicine | Admitting: Internal Medicine

## 2016-10-14 ENCOUNTER — Encounter (HOSPITAL_COMMUNITY)
Admission: RE | Admit: 2016-10-14 | Discharge: 2016-10-14 | Disposition: A | Discharge status: Home / Self Care | Payer: Self-pay | Source: Ambulatory Visit | Attending: Internal Medicine | Admitting: Internal Medicine

## 2016-10-14 DIAGNOSIS — R0602 Shortness of breath: Secondary | ICD-10-CM | POA: Insufficient documentation

## 2016-10-14 DIAGNOSIS — J449 Chronic obstructive pulmonary disease, unspecified: Secondary | ICD-10-CM | POA: Insufficient documentation

## 2016-10-14 DIAGNOSIS — Z5189 Encounter for other specified aftercare: Secondary | ICD-10-CM | POA: Insufficient documentation

## 2016-10-14 DIAGNOSIS — J984 Other disorders of lung: Secondary | ICD-10-CM | POA: Insufficient documentation

## 2016-10-19 ENCOUNTER — Encounter (HOSPITAL_COMMUNITY)
Admission: RE | Admit: 2016-10-19 | Discharge: 2016-10-19 | Disposition: A | Discharge status: Home / Self Care | Payer: Self-pay | Source: Ambulatory Visit | Attending: Internal Medicine | Admitting: Internal Medicine

## 2016-10-21 ENCOUNTER — Encounter (HOSPITAL_COMMUNITY)
Admission: RE | Admit: 2016-10-21 | Discharge: 2016-10-21 | Disposition: A | Discharge status: Home / Self Care | Payer: Self-pay | Source: Ambulatory Visit | Attending: Internal Medicine | Admitting: Internal Medicine

## 2016-10-22 DIAGNOSIS — Z79899 Other long term (current) drug therapy: Secondary | ICD-10-CM | POA: Diagnosis not present

## 2016-10-22 DIAGNOSIS — Z125 Encounter for screening for malignant neoplasm of prostate: Secondary | ICD-10-CM | POA: Diagnosis not present

## 2016-10-22 DIAGNOSIS — I10 Essential (primary) hypertension: Secondary | ICD-10-CM | POA: Diagnosis not present

## 2016-10-28 ENCOUNTER — Encounter (HOSPITAL_COMMUNITY)
Admission: RE | Admit: 2016-10-28 | Discharge: 2016-10-28 | Disposition: A | Discharge status: Home / Self Care | Payer: Self-pay | Source: Ambulatory Visit | Attending: Internal Medicine | Admitting: Internal Medicine

## 2016-10-28 DIAGNOSIS — J449 Chronic obstructive pulmonary disease, unspecified: Secondary | ICD-10-CM | POA: Diagnosis not present

## 2016-10-28 DIAGNOSIS — Z8679 Personal history of other diseases of the circulatory system: Secondary | ICD-10-CM | POA: Diagnosis not present

## 2016-10-28 DIAGNOSIS — I10 Essential (primary) hypertension: Secondary | ICD-10-CM | POA: Diagnosis not present

## 2016-10-28 DIAGNOSIS — E778 Other disorders of glycoprotein metabolism: Secondary | ICD-10-CM | POA: Diagnosis not present

## 2016-10-28 DIAGNOSIS — E7800 Pure hypercholesterolemia, unspecified: Secondary | ICD-10-CM | POA: Diagnosis not present

## 2016-11-04 ENCOUNTER — Encounter (HOSPITAL_COMMUNITY)
Admission: RE | Admit: 2016-11-04 | Discharge: 2016-11-04 | Disposition: A | Discharge status: Home / Self Care | Payer: Self-pay | Source: Ambulatory Visit | Attending: Internal Medicine | Admitting: Internal Medicine

## 2016-11-04 ENCOUNTER — Other Ambulatory Visit: Payer: Self-pay | Admitting: Internal Medicine

## 2016-11-04 NOTE — Progress Notes (Signed)
Nutrition Note Spoke with pt. Pt reports his protein level is down (5.9). Pt is interested in increasing his protein intake. Ways to increase protein consumption discussed. Pt states he drinks Ensure but it makes him feel bloated. Adding whey protein powder to applesauce, yogurt, soups, etc discussed. Pt given handouts for High Protein Foods list and Protein Content of Foods. Pt expressed understanding of the information given. Continue client-centered nutrition education by RD as part of interdisciplinary care.  Monitor and evaluate progress toward nutrition goal with team.  Derek Mound, M.Ed, RD, LDN, CDE 11/04/2016 10:48 AM

## 2016-11-09 ENCOUNTER — Encounter (HOSPITAL_COMMUNITY)
Admission: RE | Admit: 2016-11-09 | Discharge: 2016-11-09 | Disposition: A | Discharge status: Home / Self Care | Payer: Self-pay | Source: Ambulatory Visit | Attending: Internal Medicine | Admitting: Internal Medicine

## 2016-11-11 ENCOUNTER — Encounter (HOSPITAL_COMMUNITY)
Admission: RE | Admit: 2016-11-11 | Discharge: 2016-11-11 | Disposition: A | Discharge status: Home / Self Care | Payer: Self-pay | Source: Ambulatory Visit | Attending: Internal Medicine | Admitting: Internal Medicine

## 2016-11-16 ENCOUNTER — Encounter (HOSPITAL_COMMUNITY)
Admission: RE | Admit: 2016-11-16 | Discharge: 2016-11-16 | Disposition: A | Discharge status: Home / Self Care | Payer: Medicare Other | Source: Ambulatory Visit | Attending: Internal Medicine | Admitting: Internal Medicine

## 2016-11-16 DIAGNOSIS — J449 Chronic obstructive pulmonary disease, unspecified: Secondary | ICD-10-CM | POA: Insufficient documentation

## 2016-11-16 DIAGNOSIS — Z5189 Encounter for other specified aftercare: Secondary | ICD-10-CM | POA: Insufficient documentation

## 2016-11-16 DIAGNOSIS — J984 Other disorders of lung: Secondary | ICD-10-CM | POA: Insufficient documentation

## 2016-11-16 DIAGNOSIS — R0602 Shortness of breath: Secondary | ICD-10-CM | POA: Insufficient documentation

## 2016-11-18 ENCOUNTER — Encounter (HOSPITAL_COMMUNITY)
Admission: RE | Admit: 2016-11-18 | Discharge: 2016-11-18 | Disposition: A | Discharge status: Home / Self Care | Payer: Medicare Other | Source: Ambulatory Visit | Attending: Internal Medicine | Admitting: Internal Medicine

## 2016-11-23 ENCOUNTER — Encounter (HOSPITAL_COMMUNITY): Payer: Medicare Other

## 2016-11-25 ENCOUNTER — Encounter (HOSPITAL_COMMUNITY): Payer: Medicare Other

## 2016-11-30 ENCOUNTER — Encounter (HOSPITAL_COMMUNITY)
Admission: RE | Admit: 2016-11-30 | Discharge: 2016-11-30 | Disposition: A | Discharge status: Home / Self Care | Payer: Medicare Other | Source: Ambulatory Visit | Attending: Internal Medicine | Admitting: Internal Medicine

## 2016-12-02 ENCOUNTER — Encounter (HOSPITAL_COMMUNITY)
Admission: RE | Admit: 2016-12-02 | Discharge: 2016-12-02 | Disposition: A | Discharge status: Home / Self Care | Payer: Medicare Other | Source: Ambulatory Visit | Attending: Internal Medicine | Admitting: Internal Medicine

## 2016-12-05 ENCOUNTER — Other Ambulatory Visit: Payer: Self-pay | Admitting: Internal Medicine

## 2016-12-07 ENCOUNTER — Encounter (HOSPITAL_COMMUNITY)
Admission: RE | Admit: 2016-12-07 | Discharge: 2016-12-07 | Disposition: A | Discharge status: Home / Self Care | Payer: Medicare Other | Source: Ambulatory Visit | Attending: Internal Medicine | Admitting: Internal Medicine

## 2016-12-09 ENCOUNTER — Encounter (HOSPITAL_COMMUNITY): Admission: RE | Admit: 2016-12-09 | Payer: Medicare Other | Source: Ambulatory Visit

## 2016-12-14 ENCOUNTER — Encounter (HOSPITAL_COMMUNITY)
Admission: RE | Admit: 2016-12-14 | Discharge: 2016-12-14 | Disposition: A | Discharge status: Home / Self Care | Payer: Self-pay | Source: Ambulatory Visit | Attending: Internal Medicine | Admitting: Internal Medicine

## 2016-12-14 DIAGNOSIS — J449 Chronic obstructive pulmonary disease, unspecified: Secondary | ICD-10-CM | POA: Insufficient documentation

## 2016-12-14 DIAGNOSIS — R0602 Shortness of breath: Secondary | ICD-10-CM | POA: Insufficient documentation

## 2016-12-14 DIAGNOSIS — J984 Other disorders of lung: Secondary | ICD-10-CM | POA: Insufficient documentation

## 2016-12-14 DIAGNOSIS — Z5189 Encounter for other specified aftercare: Secondary | ICD-10-CM | POA: Insufficient documentation

## 2016-12-16 ENCOUNTER — Encounter (HOSPITAL_COMMUNITY)
Admission: RE | Admit: 2016-12-16 | Discharge: 2016-12-16 | Disposition: A | Discharge status: Home / Self Care | Payer: Self-pay | Source: Ambulatory Visit | Attending: Internal Medicine | Admitting: Internal Medicine

## 2016-12-21 ENCOUNTER — Encounter (HOSPITAL_COMMUNITY): Payer: Self-pay

## 2016-12-23 ENCOUNTER — Encounter (HOSPITAL_COMMUNITY): Payer: Self-pay

## 2016-12-28 ENCOUNTER — Encounter (HOSPITAL_COMMUNITY): Payer: Self-pay

## 2016-12-30 ENCOUNTER — Encounter (HOSPITAL_COMMUNITY): Payer: Self-pay

## 2017-01-04 ENCOUNTER — Encounter (HOSPITAL_COMMUNITY)
Admission: RE | Admit: 2017-01-04 | Discharge: 2017-01-04 | Disposition: A | Discharge status: Home / Self Care | Payer: Self-pay | Source: Ambulatory Visit | Attending: Internal Medicine | Admitting: Internal Medicine

## 2017-01-06 ENCOUNTER — Encounter (HOSPITAL_COMMUNITY)
Admission: RE | Admit: 2017-01-06 | Discharge: 2017-01-06 | Disposition: A | Discharge status: Home / Self Care | Payer: Self-pay | Source: Ambulatory Visit | Attending: Internal Medicine | Admitting: Internal Medicine

## 2017-01-07 ENCOUNTER — Other Ambulatory Visit: Payer: Self-pay | Admitting: Internal Medicine

## 2017-01-11 ENCOUNTER — Encounter (HOSPITAL_COMMUNITY)
Admission: RE | Admit: 2017-01-11 | Discharge: 2017-01-11 | Disposition: A | Discharge status: Home / Self Care | Payer: Self-pay | Source: Ambulatory Visit | Attending: Internal Medicine | Admitting: Internal Medicine

## 2017-01-12 DIAGNOSIS — R972 Elevated prostate specific antigen [PSA]: Secondary | ICD-10-CM | POA: Diagnosis not present

## 2017-01-12 DIAGNOSIS — R351 Nocturia: Secondary | ICD-10-CM | POA: Diagnosis not present

## 2017-01-12 DIAGNOSIS — N401 Enlarged prostate with lower urinary tract symptoms: Secondary | ICD-10-CM | POA: Diagnosis not present

## 2017-01-13 ENCOUNTER — Encounter (HOSPITAL_COMMUNITY)
Admission: RE | Admit: 2017-01-13 | Discharge: 2017-01-13 | Disposition: A | Discharge status: Home / Self Care | Payer: Self-pay | Source: Ambulatory Visit | Attending: Internal Medicine | Admitting: Internal Medicine

## 2017-01-18 ENCOUNTER — Encounter (HOSPITAL_COMMUNITY)
Admission: RE | Admit: 2017-01-18 | Discharge: 2017-01-18 | Disposition: A | Discharge status: Home / Self Care | Payer: Self-pay | Source: Ambulatory Visit | Attending: Internal Medicine | Admitting: Internal Medicine

## 2017-01-18 DIAGNOSIS — R0602 Shortness of breath: Secondary | ICD-10-CM | POA: Insufficient documentation

## 2017-01-18 DIAGNOSIS — J449 Chronic obstructive pulmonary disease, unspecified: Secondary | ICD-10-CM | POA: Insufficient documentation

## 2017-01-18 DIAGNOSIS — J984 Other disorders of lung: Secondary | ICD-10-CM | POA: Insufficient documentation

## 2017-01-18 DIAGNOSIS — Z5189 Encounter for other specified aftercare: Secondary | ICD-10-CM | POA: Insufficient documentation

## 2017-01-20 ENCOUNTER — Encounter: Payer: Self-pay | Admitting: Internal Medicine

## 2017-01-20 ENCOUNTER — Encounter (HOSPITAL_COMMUNITY)
Admission: RE | Admit: 2017-01-20 | Discharge: 2017-01-20 | Disposition: A | Discharge status: Home / Self Care | Payer: Self-pay | Source: Ambulatory Visit | Attending: Internal Medicine | Admitting: Internal Medicine

## 2017-01-20 ENCOUNTER — Ambulatory Visit (INDEPENDENT_AMBULATORY_CARE_PROVIDER_SITE_OTHER): Payer: Medicare Other | Admitting: Internal Medicine

## 2017-01-20 DIAGNOSIS — J449 Chronic obstructive pulmonary disease, unspecified: Secondary | ICD-10-CM

## 2017-01-20 DIAGNOSIS — R911 Solitary pulmonary nodule: Secondary | ICD-10-CM

## 2017-01-20 MED ORDER — GLYCOPYRROLATE-FORMOTEROL 9-4.8 MCG/ACT IN AERO
2.0000 | INHALATION_SPRAY | Freq: Two times a day (BID) | RESPIRATORY_TRACT | 0 refills | Status: DC
Start: 2017-01-20 — End: 2017-06-23

## 2017-01-20 MED ORDER — UMECLIDINIUM-VILANTEROL 62.5-25 MCG/INH IN AEPB: 1.0000 | INHALATION_SPRAY | Freq: Every day | RESPIRATORY_TRACT | Status: DC

## 2017-01-20 NOTE — Patient Instructions (Signed)
Sample Bevespi inhaler      Inhale 2 puffs, twice daily     Try this instead of Symbicort  Then try sample Anoro Ellipta   Inhale 1 puff, once daily     Try this instead of either Symbicort or Bevespi  When the samples are used up, go back to Symbicort for comparison.   If you prefer one over another, let us know.

## 2017-01-20 NOTE — Progress Notes (Signed)
Patient ID: [name], male    DOB: 10/27/1934, 82 y.o.   MRN: [id number] PCP Dr Hulan Fess  HPI male former smoker followed for COPD/emphysema, pulmonary nodules, hypoxic respiratory failure, complicated by CAD O1BP 08/17/56-  MM PFT 04/27/11- ------------------------------------------------------------------------------  07/14/2016-82 year old male former smoker followed for COPD/emphysema, pulmonary nodules, hypoxic respiratory failure, complicated by CAD O2 3 L/[facility] FOLLOWS FOR: Breathing is unchanged since last OV. Reports DOE. Denies chest tightness, wheezing or coughing. Can sit comfortably at rest on room air but with any exertion he needs oxygen. 3 L sufficient. No acute issues recently. Up-to-date on vaccines. Questioning best portable oxygen system-we discussed liquid oxygen versus POC. Discussed recent CT scan-stable. Discussed medication options-try Trelegy. CT chest 03/04/2016 IMPRESSION: 1. Interval resolution of 6 mm left upper lobe pulmonary nodule with remaining bilateral pulmonary nodules unchanged in the interval. 2. No new or progressive findings. 3. Emphysema. 4. Abdominal aortic atherosclerosis.  01/20/17- 82 year old male former smoker followed for COPD/emphysema, pulmonary nodules, hypoxic respiratory failure, complicated by CAD O2 3 L/[facility] COPD follow up for 6 month check up patient states that he has been doing all right  having more trouble when he moves around as in SOB  Slow, gradual increase dyspnea on exertion. No acute events. Chronic mild peripheral edema now. Participating pulmonary rehabilitation. We discussed alternatives to Advair.  Review of Systems-see HPI   + = pos Constitutional:   No-   weight loss, night sweats fevers, no-chills, fatigue, lassitude. HEENT:   No-  headaches, difficulty swallowing, tooth/dental problems, +sore throat,       No-  sneezing, itching, ear ache, nasal congestion, post nasal drip,  CV:  No-chest pain,  no-orthopnea, PND, No-swelling in lower extremities, anasarca, dizziness, palpitations Resp: + shortness of breath with exertion, not at rest.               cough,  + non-productive cough,  No-  coughing up of blood.              in color of mucus.  No- wheezing.   Skin: No-   rash or lesions. GI:  No-   heartburn, indigestion, abdominal pain, nausea,   GU: No-   dysuria,  MS:  No-   joint pain or swelling.  Neuro-  Psych:  No- change in mood or affect. No acute depression or anxiety.  No memory loss.  OBJ- General- Alert, Oriented, Affect-appropriate, Distress- none acute. + O2 3L , + looks thinner Skin- rash-none, lesions- none, excoriation- none Lymphadenopathy- none Head- atraumatic            Eyes- Gross vision intact, PERRLA, conjunctivae clear secretions            Ears- Hearing, canals-normal            Nose- Clear, no-Septal dev, mucus, polyps, erosion, perforation             Throat- Mallampati II , mucosa clear , drainage- none, tonsils- atrophic.  Neck- flexible , trachea midline, no stridor , thyroid nl, carotid no bruit Chest - symmetrical excursion , unlabored           Heart/CV- RRR , no murmur , no gallop  , no rub, nl s1 s2                           - JVD- none , edema-none, stasis changes- none, varices- none  Lung-  Distant+, wheeze -none, unlabored, cough+-none , dullness-none, rub- none           Chest wall-  Abd-  Br/ Gen/ Rectal- Not done, not indicated Extrem- cyanosis- none, clubbing, none, atrophy- none, strength- nl.  Neuro- grossly intact to observation

## 2017-01-21 NOTE — Assessment & Plan Note (Signed)
Small nodules have not progressed. Anticipate recheck in 6-12 months.

## 2017-01-21 NOTE — Assessment & Plan Note (Signed)
I anticipate gradual decline as normal course of his illness. Plan-try replacing Advair/Spiriva with Anoro Ellipta inhaler samples

## 2017-01-25 ENCOUNTER — Encounter (HOSPITAL_COMMUNITY): Payer: Self-pay

## 2017-01-27 ENCOUNTER — Encounter (HOSPITAL_COMMUNITY): Payer: Self-pay

## 2017-02-01 ENCOUNTER — Encounter (HOSPITAL_COMMUNITY): Payer: Self-pay

## 2017-02-03 ENCOUNTER — Encounter (HOSPITAL_COMMUNITY)
Admission: RE | Admit: 2017-02-03 | Discharge: 2017-02-03 | Disposition: A | Discharge status: Home / Self Care | Payer: Self-pay | Source: Ambulatory Visit | Attending: Internal Medicine | Admitting: Internal Medicine

## 2017-02-08 ENCOUNTER — Encounter (HOSPITAL_COMMUNITY)
Admission: RE | Admit: 2017-02-08 | Discharge: 2017-02-08 | Disposition: A | Discharge status: Home / Self Care | Payer: Self-pay | Source: Ambulatory Visit | Attending: Internal Medicine | Admitting: Internal Medicine

## 2017-02-10 ENCOUNTER — Encounter (HOSPITAL_COMMUNITY)
Admission: RE | Admit: 2017-02-10 | Discharge: 2017-02-10 | Disposition: A | Discharge status: Home / Self Care | Payer: Medicare Other | Source: Ambulatory Visit | Attending: Internal Medicine | Admitting: Internal Medicine

## 2017-02-15 ENCOUNTER — Encounter (HOSPITAL_COMMUNITY)
Admission: RE | Admit: 2017-02-15 | Discharge: 2017-02-15 | Disposition: A | Discharge status: Home / Self Care | Payer: Self-pay | Source: Ambulatory Visit | Attending: Internal Medicine | Admitting: Internal Medicine

## 2017-02-15 DIAGNOSIS — R0602 Shortness of breath: Secondary | ICD-10-CM | POA: Insufficient documentation

## 2017-02-15 DIAGNOSIS — J984 Other disorders of lung: Secondary | ICD-10-CM | POA: Insufficient documentation

## 2017-02-15 DIAGNOSIS — Z5189 Encounter for other specified aftercare: Secondary | ICD-10-CM | POA: Insufficient documentation

## 2017-02-15 DIAGNOSIS — J449 Chronic obstructive pulmonary disease, unspecified: Secondary | ICD-10-CM | POA: Insufficient documentation

## 2017-02-17 ENCOUNTER — Encounter (HOSPITAL_COMMUNITY)
Admission: RE | Admit: 2017-02-17 | Discharge: 2017-02-17 | Disposition: A | Discharge status: Home / Self Care | Payer: Self-pay | Source: Ambulatory Visit | Attending: Internal Medicine | Admitting: Internal Medicine

## 2017-02-22 ENCOUNTER — Encounter (HOSPITAL_COMMUNITY)
Admission: RE | Admit: 2017-02-22 | Discharge: 2017-02-22 | Disposition: A | Discharge status: Home / Self Care | Payer: Self-pay | Source: Ambulatory Visit | Attending: Internal Medicine | Admitting: Internal Medicine

## 2017-02-24 ENCOUNTER — Encounter (HOSPITAL_COMMUNITY)
Admission: RE | Admit: 2017-02-24 | Discharge: 2017-02-24 | Disposition: A | Discharge status: Home / Self Care | Payer: Self-pay | Source: Ambulatory Visit | Attending: Internal Medicine | Admitting: Internal Medicine

## 2017-03-01 ENCOUNTER — Encounter (HOSPITAL_COMMUNITY)
Admission: RE | Admit: 2017-03-01 | Discharge: 2017-03-01 | Disposition: A | Discharge status: Home / Self Care | Payer: Self-pay | Source: Ambulatory Visit | Attending: Internal Medicine | Admitting: Internal Medicine

## 2017-03-03 ENCOUNTER — Encounter (HOSPITAL_COMMUNITY)
Admission: RE | Admit: 2017-03-03 | Discharge: 2017-03-03 | Disposition: A | Discharge status: Home / Self Care | Payer: Self-pay | Source: Ambulatory Visit | Attending: Internal Medicine | Admitting: Internal Medicine

## 2017-03-08 ENCOUNTER — Encounter (HOSPITAL_COMMUNITY)
Admission: RE | Admit: 2017-03-08 | Discharge: 2017-03-08 | Disposition: A | Discharge status: Home / Self Care | Payer: Self-pay | Source: Ambulatory Visit | Attending: Internal Medicine | Admitting: Internal Medicine

## 2017-03-10 ENCOUNTER — Encounter (HOSPITAL_COMMUNITY): Payer: Self-pay

## 2017-03-15 ENCOUNTER — Encounter (HOSPITAL_COMMUNITY)
Admission: RE | Admit: 2017-03-15 | Discharge: 2017-03-15 | Disposition: A | Discharge status: Home / Self Care | Payer: Medicare Other | Source: Ambulatory Visit | Attending: Internal Medicine | Admitting: Internal Medicine

## 2017-03-17 ENCOUNTER — Encounter (HOSPITAL_COMMUNITY): Payer: Self-pay

## 2017-03-17 DIAGNOSIS — Z5189 Encounter for other specified aftercare: Secondary | ICD-10-CM | POA: Insufficient documentation

## 2017-03-17 DIAGNOSIS — J984 Other disorders of lung: Secondary | ICD-10-CM | POA: Insufficient documentation

## 2017-03-17 DIAGNOSIS — R0602 Shortness of breath: Secondary | ICD-10-CM | POA: Insufficient documentation

## 2017-03-17 DIAGNOSIS — J449 Chronic obstructive pulmonary disease, unspecified: Secondary | ICD-10-CM | POA: Insufficient documentation

## 2017-03-21 DIAGNOSIS — H40003 Preglaucoma, unspecified, bilateral: Secondary | ICD-10-CM | POA: Diagnosis not present

## 2017-03-21 DIAGNOSIS — H26493 Other secondary cataract, bilateral: Secondary | ICD-10-CM | POA: Diagnosis not present

## 2017-03-21 DIAGNOSIS — H02839 Dermatochalasis of unspecified eye, unspecified eyelid: Secondary | ICD-10-CM | POA: Diagnosis not present

## 2017-03-21 DIAGNOSIS — I10 Essential (primary) hypertension: Secondary | ICD-10-CM | POA: Diagnosis not present

## 2017-03-22 ENCOUNTER — Encounter (HOSPITAL_COMMUNITY): Payer: Self-pay

## 2017-03-24 ENCOUNTER — Encounter (HOSPITAL_COMMUNITY)
Admission: RE | Admit: 2017-03-24 | Discharge: 2017-03-24 | Disposition: A | Discharge status: Home / Self Care | Payer: Self-pay | Source: Ambulatory Visit | Attending: Internal Medicine | Admitting: Internal Medicine

## 2017-03-29 ENCOUNTER — Encounter (HOSPITAL_COMMUNITY)
Admission: RE | Admit: 2017-03-29 | Discharge: 2017-03-29 | Disposition: A | Discharge status: Home / Self Care | Payer: Self-pay | Source: Ambulatory Visit | Attending: Internal Medicine | Admitting: Internal Medicine

## 2017-03-31 ENCOUNTER — Encounter (HOSPITAL_COMMUNITY)
Admission: RE | Admit: 2017-03-31 | Discharge: 2017-03-31 | Disposition: A | Discharge status: Home / Self Care | Payer: Self-pay | Source: Ambulatory Visit | Attending: Internal Medicine | Admitting: Internal Medicine

## 2017-04-05 ENCOUNTER — Encounter (HOSPITAL_COMMUNITY)
Admission: RE | Admit: 2017-04-05 | Discharge: 2017-04-05 | Disposition: A | Discharge status: Home / Self Care | Payer: Self-pay | Source: Ambulatory Visit | Attending: Internal Medicine | Admitting: Internal Medicine

## 2017-04-07 ENCOUNTER — Encounter (HOSPITAL_COMMUNITY)
Admission: RE | Admit: 2017-04-07 | Discharge: 2017-04-07 | Disposition: A | Discharge status: Home / Self Care | Payer: Self-pay | Source: Ambulatory Visit | Attending: Internal Medicine | Admitting: Internal Medicine

## 2017-04-12 ENCOUNTER — Encounter (HOSPITAL_COMMUNITY)
Admission: RE | Admit: 2017-04-12 | Discharge: 2017-04-12 | Disposition: A | Discharge status: Home / Self Care | Payer: Self-pay | Source: Ambulatory Visit | Attending: Internal Medicine | Admitting: Internal Medicine

## 2017-04-14 ENCOUNTER — Encounter (HOSPITAL_COMMUNITY)
Admission: RE | Admit: 2017-04-14 | Discharge: 2017-04-14 | Disposition: A | Discharge status: Home / Self Care | Payer: Medicare Other | Source: Ambulatory Visit | Attending: Internal Medicine | Admitting: Internal Medicine

## 2017-04-19 ENCOUNTER — Encounter (HOSPITAL_COMMUNITY)
Admission: RE | Admit: 2017-04-19 | Discharge: 2017-04-19 | Disposition: A | Discharge status: Home / Self Care | Payer: Self-pay | Source: Ambulatory Visit | Attending: Internal Medicine | Admitting: Internal Medicine

## 2017-04-19 DIAGNOSIS — J984 Other disorders of lung: Secondary | ICD-10-CM | POA: Insufficient documentation

## 2017-04-19 DIAGNOSIS — Z5189 Encounter for other specified aftercare: Secondary | ICD-10-CM | POA: Insufficient documentation

## 2017-04-19 DIAGNOSIS — R0602 Shortness of breath: Secondary | ICD-10-CM | POA: Insufficient documentation

## 2017-04-19 DIAGNOSIS — J449 Chronic obstructive pulmonary disease, unspecified: Secondary | ICD-10-CM | POA: Insufficient documentation

## 2017-04-21 ENCOUNTER — Encounter (HOSPITAL_COMMUNITY)
Admission: RE | Admit: 2017-04-21 | Discharge: 2017-04-21 | Disposition: A | Discharge status: Home / Self Care | Payer: Self-pay | Source: Ambulatory Visit | Attending: Internal Medicine | Admitting: Internal Medicine

## 2017-04-22 DIAGNOSIS — Z23 Encounter for immunization: Secondary | ICD-10-CM | POA: Diagnosis not present

## 2017-04-26 ENCOUNTER — Encounter (HOSPITAL_COMMUNITY): Payer: Self-pay

## 2017-04-28 ENCOUNTER — Encounter (HOSPITAL_COMMUNITY)
Admission: RE | Admit: 2017-04-28 | Discharge: 2017-04-28 | Disposition: A | Discharge status: Home / Self Care | Payer: Self-pay | Source: Ambulatory Visit | Attending: Internal Medicine | Admitting: Internal Medicine

## 2017-04-28 DIAGNOSIS — E7800 Pure hypercholesterolemia, unspecified: Secondary | ICD-10-CM | POA: Diagnosis not present

## 2017-04-28 DIAGNOSIS — Z79899 Other long term (current) drug therapy: Secondary | ICD-10-CM | POA: Diagnosis not present

## 2017-04-28 DIAGNOSIS — Z8679 Personal history of other diseases of the circulatory system: Secondary | ICD-10-CM | POA: Diagnosis not present

## 2017-04-28 DIAGNOSIS — E778 Other disorders of glycoprotein metabolism: Secondary | ICD-10-CM | POA: Diagnosis not present

## 2017-04-28 DIAGNOSIS — I10 Essential (primary) hypertension: Secondary | ICD-10-CM | POA: Diagnosis not present

## 2017-04-28 DIAGNOSIS — J439 Emphysema, unspecified: Secondary | ICD-10-CM | POA: Diagnosis not present

## 2017-04-28 DIAGNOSIS — R609 Edema, unspecified: Secondary | ICD-10-CM | POA: Diagnosis not present

## 2017-04-28 DIAGNOSIS — N401 Enlarged prostate with lower urinary tract symptoms: Secondary | ICD-10-CM | POA: Diagnosis not present

## 2017-05-03 ENCOUNTER — Encounter (HOSPITAL_COMMUNITY)
Admission: RE | Admit: 2017-05-03 | Discharge: 2017-05-03 | Disposition: A | Discharge status: Home / Self Care | Payer: Self-pay | Source: Ambulatory Visit | Attending: Internal Medicine | Admitting: Internal Medicine

## 2017-05-05 ENCOUNTER — Encounter (HOSPITAL_COMMUNITY): Payer: Self-pay

## 2017-05-10 ENCOUNTER — Encounter (HOSPITAL_COMMUNITY)
Admission: RE | Admit: 2017-05-10 | Discharge: 2017-05-10 | Disposition: A | Discharge status: Home / Self Care | Payer: Self-pay | Source: Ambulatory Visit | Attending: Internal Medicine | Admitting: Internal Medicine

## 2017-05-12 ENCOUNTER — Encounter (HOSPITAL_COMMUNITY): Payer: Self-pay

## 2017-05-17 ENCOUNTER — Encounter (HOSPITAL_COMMUNITY)
Admission: RE | Admit: 2017-05-17 | Discharge: 2017-05-17 | Disposition: A | Discharge status: Home / Self Care | Payer: Self-pay | Source: Ambulatory Visit | Attending: Internal Medicine | Admitting: Internal Medicine

## 2017-05-17 DIAGNOSIS — J449 Chronic obstructive pulmonary disease, unspecified: Secondary | ICD-10-CM | POA: Insufficient documentation

## 2017-05-17 DIAGNOSIS — J984 Other disorders of lung: Secondary | ICD-10-CM | POA: Insufficient documentation

## 2017-05-17 DIAGNOSIS — R0602 Shortness of breath: Secondary | ICD-10-CM | POA: Insufficient documentation

## 2017-05-17 DIAGNOSIS — Z5189 Encounter for other specified aftercare: Secondary | ICD-10-CM | POA: Insufficient documentation

## 2017-05-19 ENCOUNTER — Encounter (HOSPITAL_COMMUNITY)
Admission: RE | Admit: 2017-05-19 | Discharge: 2017-05-19 | Disposition: A | Discharge status: Home / Self Care | Payer: Self-pay | Source: Ambulatory Visit | Attending: Internal Medicine | Admitting: Internal Medicine

## 2017-05-24 ENCOUNTER — Encounter (HOSPITAL_COMMUNITY)
Admission: RE | Admit: 2017-05-24 | Discharge: 2017-05-24 | Disposition: A | Discharge status: Home / Self Care | Payer: Self-pay | Source: Ambulatory Visit | Attending: Internal Medicine | Admitting: Internal Medicine

## 2017-05-26 ENCOUNTER — Encounter (HOSPITAL_COMMUNITY): Payer: Self-pay

## 2017-05-31 ENCOUNTER — Encounter (HOSPITAL_COMMUNITY)
Admission: RE | Admit: 2017-05-31 | Discharge: 2017-05-31 | Disposition: A | Discharge status: Home / Self Care | Payer: Self-pay | Source: Ambulatory Visit | Attending: Internal Medicine | Admitting: Internal Medicine

## 2017-06-02 ENCOUNTER — Encounter (HOSPITAL_COMMUNITY): Payer: Self-pay

## 2017-06-07 ENCOUNTER — Encounter (HOSPITAL_COMMUNITY)
Admission: RE | Admit: 2017-06-07 | Discharge: 2017-06-07 | Disposition: A | Discharge status: Home / Self Care | Payer: Self-pay | Source: Ambulatory Visit | Attending: Internal Medicine | Admitting: Internal Medicine

## 2017-06-14 ENCOUNTER — Encounter (HOSPITAL_COMMUNITY)
Admission: RE | Admit: 2017-06-14 | Discharge: 2017-06-14 | Disposition: A | Discharge status: Home / Self Care | Payer: Self-pay | Source: Ambulatory Visit | Attending: Internal Medicine | Admitting: Internal Medicine

## 2017-06-17 ENCOUNTER — Other Ambulatory Visit: Payer: Self-pay | Admitting: Internal Medicine

## 2017-06-20 ENCOUNTER — Encounter: Payer: Self-pay | Admitting: Internal Medicine

## 2017-06-20 NOTE — Telephone Encounter (Signed)
Received the following email from patient.    "I have tried the two inhalers you gave me at our last appointment.These were tried without using Symbicort.I did not feel they did better than when I was on Spiriva Respimat and Symbicort so I went back on both.I'm still not sure if you want me on Symbicort since you asked me to stop that when I tried the others."  Patient was seen on 01/20/17 and was given samples of Anoro and Bevespi to try in place of the Symbicort.   CY, please advise. Thanks!

## 2017-06-20 NOTE — Telephone Encounter (Signed)
Fine to stay on Symbicort and Spiriva.  We just needed to try out the others to see if they were any better.

## 2017-06-21 ENCOUNTER — Encounter (HOSPITAL_COMMUNITY)
Admission: RE | Admit: 2017-06-21 | Discharge: 2017-06-21 | Disposition: A | Discharge status: Home / Self Care | Payer: Self-pay | Source: Ambulatory Visit | Attending: Internal Medicine | Admitting: Internal Medicine

## 2017-06-21 DIAGNOSIS — J984 Other disorders of lung: Secondary | ICD-10-CM | POA: Insufficient documentation

## 2017-06-21 DIAGNOSIS — J449 Chronic obstructive pulmonary disease, unspecified: Secondary | ICD-10-CM | POA: Insufficient documentation

## 2017-06-21 DIAGNOSIS — Z5189 Encounter for other specified aftercare: Secondary | ICD-10-CM | POA: Insufficient documentation

## 2017-06-21 DIAGNOSIS — R0602 Shortness of breath: Secondary | ICD-10-CM | POA: Insufficient documentation

## 2017-06-23 ENCOUNTER — Ambulatory Visit (INDEPENDENT_AMBULATORY_CARE_PROVIDER_SITE_OTHER): Payer: Medicare Other | Admitting: Cardiovascular Disease

## 2017-06-23 ENCOUNTER — Encounter (HOSPITAL_COMMUNITY)
Admission: RE | Admit: 2017-06-23 | Discharge: 2017-06-23 | Disposition: A | Discharge status: Home / Self Care | Payer: Self-pay | Source: Ambulatory Visit | Attending: Internal Medicine | Admitting: Internal Medicine

## 2017-06-23 ENCOUNTER — Encounter: Payer: Self-pay | Admitting: Cardiovascular Disease

## 2017-06-23 VITALS — BP 148/60 | HR 75 | Ht 69.0 in | Wt 162.1 lb

## 2017-06-23 DIAGNOSIS — I2510 Atherosclerotic heart disease of native coronary artery without angina pectoris: Secondary | ICD-10-CM | POA: Diagnosis not present

## 2017-06-23 DIAGNOSIS — E785 Hyperlipidemia, unspecified: Secondary | ICD-10-CM | POA: Diagnosis not present

## 2017-06-23 DIAGNOSIS — I10 Essential (primary) hypertension: Secondary | ICD-10-CM

## 2017-06-23 MED ORDER — LOSARTAN POTASSIUM 25 MG PO TABS: 25.0000 mg | ORAL_TABLET | Freq: Every day | ORAL | 3 refills | Status: DC

## 2017-06-23 NOTE — Patient Instructions (Signed)
Medication Instructions:  1) START LOSARTAN 25 mg daily  Labwork: Please get a Digoxin level drawn. This will need to be drawn BEFORE you take your morning dose.  Testing/Procedures: None  Follow-Up: Your provider wants you to follow-up in: 1 year with Dr. Burt Knack. You will receive a reminder letter in the mail two months in advance. If you don't receive a letter, please call our office to schedule the follow-up appointment.    Any Other Special Instructions Will Be Listed Below (If Applicable).     If you need a refill on your cardiac medications before your next appointment, please call your pharmacy.

## 2017-06-23 NOTE — Progress Notes (Signed)
Cardiology Office Note Date:  06/23/2017   ID:  [name], DOB 16-Aug-1935, MRN [id number]  PCP:  Hulan Fess, MD  Cardiologist:  Sherren Mocha, MD    Chief Complaint  Patient presents with  . Follow-up  . Coronary Artery Disease     History of Present Illness: [name] is a 82 y.o. male who presents for follow-up evaluation. He has been diagnosed with subclinical atherosclerosis based on CT imaging of the chest. Coronary calcifications were noted. There is also mild to moderate atherosclerotic calcification of the aorta. He underwent cardiac catheterization in 2003 that demonstrated nonobstructive coronary disease with the exception of an 80% ostial stenosis of a small ramus intermedius branch. His left ventricular function has been normal.  The patient is here alone today.  States that his breathing has slowly gotten worse over the last year.  He does not have orthopnea or PND.  He denies chest pain or pressure.  His losartan was discontinued because he was running low normal blood pressures.  Since he has stopped losartan his readings have been in the 130s and 140s.  He continues to participate in pulmonary rehab.  Past Medical History:  Diagnosis Date  . Allergic rhinitis, cause unspecified   . COPD (chronic obstructive pulmonary disease) ([place])   . Emphysema of lung ([place])   . Pulmonary nodule     Past Surgical History:  Procedure Laterality Date  . APPENDECTOMY    . ROTATOR CUFF REPAIR    . TONSILLECTOMY    . VASECTOMY      Current Outpatient Medications  Medication Sig Dispense Refill  . albuterol (PROAIR HFA) 108 (90 BASE) MCG/ACT inhaler Inhale 2 puffs into the lungs 4 (four) times daily as needed for wheezing or shortness of breath. 1 Inhaler 5  . albuterol (PROVENTIL) (2.5 MG/3ML) 0.083% nebulizer solution Take 2.5 mg by nebulization every 6 (six) hours as needed for wheezing or shortness of breath.    [name] aspirin EC 81 MG tablet Take 1 tablet (81 mg  total) by mouth daily.    . digoxin (LANOXIN) 0.25 MG tablet Take 250 mcg by mouth daily.      . pravastatin (PRAVACHOL) 40 MG tablet Take 1 tablet by mouth daily.    [name] SPIRIVA RESPIMAT 2.5 MCG/ACT AERS INHALE 2 PUFFS BY MOUTH EVERY DAY 4 g 11  . SYMBICORT 160-4.5 MCG/ACT inhaler INHALE TWO PUFFS BY MOUTH TWICE DAILY THEN RINSE MOUTH 10.2 g 0  . tamsulosin (FLOMAX) 0.4 MG CAPS capsule Take 1 capsule by mouth daily.  2  . losartan (COZAAR) 25 MG tablet Take 1 tablet (25 mg total) daily by mouth. 90 tablet 3   No current facility-administered medications for this visit.     Allergies:   Tolectin [tolmetin sodium] and Montelukast sodium   Social History:  The patient  reports that he quit smoking about 26 years ago. he has never used smokeless tobacco. He reports that he does not drink alcohol or use drugs.   Family History:  The patient's  family history includes COPD in his mother and sister; Coronary artery disease in his unknown relative; Stroke in his father and mother.    ROS:  Please see the history of present illness.  Otherwise, review of systems is positive for shortness of breath.  All other systems are reviewed and negative.    PHYSICAL EXAM: VS:  BP (!) 148/60   Pulse 75   Ht 5\' 9"  (1.753 m)   Wt  162 lb 1.9 oz (73.5 kg)   BMI 23.94 kg/m  , BMI Body mass index is 23.94 kg/m. GEN: Well nourished, well developed, in no acute distress  HEENT: normal  Neck: no JVD, no masses. No carotid bruits Cardiac: RRR without murmur or gallop                Respiratory:  clear to auscultation bilaterally, normal work of breathing GI: soft, nontender, nondistended, + BS MS: no deformity or atrophy  Ext: 2+ pretibial edema, pedal pulses 2+= bilaterally Skin: warm and dry, no rash Neuro:  Strength and sensation are intact Psych: euthymic mood, full affect  EKG:  EKG is ordered today. The ekg ordered today shows normal sinus rhythm 75 bpm, low voltage QRS, incomplete right bundle  branch block, nonspecific ST and T wave abnormality.  Recent Labs: No results found for requested labs within last 8760 hours.   Lipid Panel  No results found for: CHOL, TRIG, HDL, CHOLHDL, VLDL, LDLCALC, LDLDIRECT    Wt Readings from Last 3 Encounters:  06/23/17 162 lb 1.9 oz (73.5 kg)  01/20/17 160 lb 12.8 oz (72.9 kg)  07/14/16 167 lb (75.8 kg)     Cardiac Studies Reviewed: Echo 01-08-2014: Study Conclusions  - Left ventricle: The cavity size was normal. Wall thickness was increased in a pattern of mild LVH. Systolic function was normal. The estimated ejection fraction was in the range of 50% to 55%. Wall motion was normal; there were no regional wall motion abnormalities. - Right ventricle: The cavity size was mildly to moderately dilated. Wall thickness was normal. - Right atrium: The atrium was mildly dilated.  ASSESSMENT AND PLAN: 1.  Coronary artery disease, native vessel, without angina: The patient will continue aspirin 81 mg daily.  We discussed the pros and cons of aspirin based on recent data, but with his known CAD will continue.  He is never had any bleeding problems or history of ulcers.  2.  Hyperlipidemia: Treated with pravastatin.  Lipids followed by Dr. Rex Kras.  3.  Hypertension: Reviewed his blood pressures and advised to start back on losartan 25 mg daily  4.  Leg swelling: Suspect primarily dependent edema.  Discussed compression and leg elevation.  Current medicines are reviewed with the patient today.  The patient does not have concerns regarding medicines.  Labs/ tests ordered today include:   Orders Placed This Encounter  Procedures  . Digoxin level  . EKG 12-Lead    Disposition:   FU one year  Signed, Sherren Mocha, MD  06/23/2017 2:10 PM    [facility] [address], [place], [place]  [id number] Phone: [phone]; Fax: [phone]

## 2017-06-26 ENCOUNTER — Encounter: Payer: Self-pay | Admitting: Cardiovascular Disease

## 2017-06-27 NOTE — Addendum Note (Signed)
Addended by: Harland German A on: 06/27/2017 10:47 AM   Modules accepted: Orders

## 2017-06-28 ENCOUNTER — Encounter (HOSPITAL_COMMUNITY)
Admission: RE | Admit: 2017-06-28 | Discharge: 2017-06-28 | Disposition: A | Discharge status: Home / Self Care | Payer: Self-pay | Source: Ambulatory Visit | Attending: Internal Medicine | Admitting: Internal Medicine

## 2017-07-05 ENCOUNTER — Encounter (HOSPITAL_COMMUNITY)
Admission: RE | Admit: 2017-07-05 | Discharge: 2017-07-05 | Disposition: A | Discharge status: Home / Self Care | Payer: Self-pay | Source: Ambulatory Visit | Attending: Internal Medicine | Admitting: Internal Medicine

## 2017-07-12 DIAGNOSIS — L570 Actinic keratosis: Secondary | ICD-10-CM | POA: Diagnosis not present

## 2017-07-12 DIAGNOSIS — Z23 Encounter for immunization: Secondary | ICD-10-CM | POA: Diagnosis not present

## 2017-07-12 DIAGNOSIS — L219 Seborrheic dermatitis, unspecified: Secondary | ICD-10-CM | POA: Diagnosis not present

## 2017-07-14 ENCOUNTER — Encounter (HOSPITAL_COMMUNITY)
Admission: RE | Admit: 2017-07-14 | Discharge: 2017-07-14 | Disposition: A | Discharge status: Home / Self Care | Payer: Self-pay | Source: Ambulatory Visit | Attending: Internal Medicine | Admitting: Internal Medicine

## 2017-07-15 DIAGNOSIS — H04123 Dry eye syndrome of bilateral lacrimal glands: Secondary | ICD-10-CM | POA: Diagnosis not present

## 2017-07-18 ENCOUNTER — Other Ambulatory Visit: Payer: Self-pay | Admitting: Internal Medicine

## 2017-07-19 ENCOUNTER — Encounter (HOSPITAL_COMMUNITY): Payer: Self-pay

## 2017-07-19 DIAGNOSIS — R0602 Shortness of breath: Secondary | ICD-10-CM | POA: Insufficient documentation

## 2017-07-19 DIAGNOSIS — J984 Other disorders of lung: Secondary | ICD-10-CM | POA: Insufficient documentation

## 2017-07-19 DIAGNOSIS — J449 Chronic obstructive pulmonary disease, unspecified: Secondary | ICD-10-CM | POA: Insufficient documentation

## 2017-07-19 DIAGNOSIS — Z5189 Encounter for other specified aftercare: Secondary | ICD-10-CM | POA: Insufficient documentation

## 2017-07-21 ENCOUNTER — Encounter (HOSPITAL_COMMUNITY)
Admission: RE | Admit: 2017-07-21 | Discharge: 2017-07-21 | Disposition: A | Discharge status: Home / Self Care | Payer: Self-pay | Source: Ambulatory Visit | Attending: Internal Medicine | Admitting: Internal Medicine

## 2017-07-26 ENCOUNTER — Encounter (HOSPITAL_COMMUNITY): Payer: Self-pay

## 2017-07-28 ENCOUNTER — Encounter (HOSPITAL_COMMUNITY)
Admission: RE | Admit: 2017-07-28 | Discharge: 2017-07-28 | Disposition: A | Discharge status: Home / Self Care | Payer: Self-pay | Source: Ambulatory Visit | Attending: Internal Medicine | Admitting: Internal Medicine

## 2017-07-28 ENCOUNTER — Ambulatory Visit (INDEPENDENT_AMBULATORY_CARE_PROVIDER_SITE_OTHER): Payer: Medicare Other | Admitting: Internal Medicine

## 2017-07-28 ENCOUNTER — Encounter: Payer: Self-pay | Admitting: Internal Medicine

## 2017-07-28 VITALS — BP 116/74 | HR 81 | Ht 69.0 in | Wt 161.0 lb

## 2017-07-28 DIAGNOSIS — J9611 Chronic respiratory failure with hypoxia: Secondary | ICD-10-CM | POA: Diagnosis not present

## 2017-07-28 DIAGNOSIS — I2510 Atherosclerotic heart disease of native coronary artery without angina pectoris: Secondary | ICD-10-CM | POA: Diagnosis not present

## 2017-07-28 DIAGNOSIS — J449 Chronic obstructive pulmonary disease, unspecified: Secondary | ICD-10-CM | POA: Diagnosis not present

## 2017-07-28 DIAGNOSIS — R911 Solitary pulmonary nodule: Secondary | ICD-10-CM

## 2017-07-28 MED ORDER — FLUTICASONE-UMECLIDIN-VILANT 100-62.5-25 MCG/INH IN AEPB: 1.0000 | INHALATION_SPRAY | Freq: Once | RESPIRATORY_TRACT | 0 refills | Status: AC

## 2017-07-28 MED ORDER — IPRATROPIUM-ALBUTEROL 0.5-2.5 (3) MG/3ML IN SOLN: 3.0000 mL | Freq: Four times a day (QID) | RESPIRATORY_TRACT | 12 refills | Status: DC | PRN

## 2017-07-28 MED ORDER — FLUTICASONE-UMECLIDIN-VILANT 100-62.5-25 MCG/INH IN AEPB: 1.0000 | INHALATION_SPRAY | Freq: Every day | RESPIRATORY_TRACT | 12 refills | Status: DC

## 2017-07-28 NOTE — Assessment & Plan Note (Signed)
Gust last chest CT which had not indicated an active concern.  We will reconsider at next visit.

## 2017-07-28 NOTE — Assessment & Plan Note (Signed)
Functional change is very subtle but we expect gradual decline is normal course of this illness Plan-refill nebulizer medication for use if needed.  Sample and prescription for Trelegy

## 2017-07-28 NOTE — Assessment & Plan Note (Signed)
Is oxygen dependent unless at rest and awake

## 2017-07-28 NOTE — Patient Instructions (Signed)
Order- DME [facility]-  Script printed for [employer] solution   # 75 ml,   1 neb every 6 hours if needed, refill x 12    Dx COPD mixed type  Sample and Script printed to try Trelegy inhaler     Inhale 1 puff, once daily then rinse mouth. Try this instead of Symbicort and Spiriva. When Trelegy sample runs out, go back to Symbicort and Spiriva for comparison. If you want to stay on Trelegy, fill the script.

## 2017-07-28 NOTE — Progress Notes (Signed)
Patient ID: [name], male    DOB: Mar 13, 1935, 82 y.o.   MRN: [id number] PCP Dr Hulan Fess  HPI male former smoker followed for COPD/emphysema, pulmonary nodules, hypoxic respiratory failure, complicated by CAD A1OI 7/86/76-  MM PFT 04/27/11- ------------------------------------------------------------------------------  01/20/17- 82 year old male former smoker followed for COPD/emphysema, pulmonary nodules, hypoxic respiratory failure, complicated by CAD O2 3 L/[facility] COPD follow up for 6 month check up patient states that he has been doing all right  having more trouble when he moves around as in SOB  Slow, gradual increase dyspnea on exertion. No acute events. Chronic mild peripheral edema now. Participating pulmonary rehabilitation. We discussed alternatives to Advair.  07/28/17- 82 year old male former smoker followed for COPD/emphysema, pulmonary nodules, hypoxic respiratory failure, complicated by CAD O2 3 L/[facility] ---Pt feels he is having harder time breathing in last 6 months. Pt has more SOB with exertion, needing to use the O2 at 4-5 liters at times con't. Over the last several years he always feels that he has been doing a little worse, but he looks about the same.  He is comfortable on room air at rest but needs oxygen with even minimal exertion and sustained walk requires 4 L.  Little cough, scant white sputum.  Has been out of medication for his nebulizer machine.  Had flu shot.  We talked about available medication types.  He is already on all 3 inhaled medication categories but he is curious about Trelegy now.  Review of Systems-see HPI   + = positive Constitutional:   No-   weight loss, night sweats fevers, no-chills, fatigue, lassitude. HEENT:   No-  headaches, difficulty swallowing, tooth/dental problems, +sore throat,       No-  sneezing, itching, ear ache, nasal congestion, post nasal drip,  CV:  No-chest pain, no-orthopnea, PND, No-swelling in lower  extremities, anasarca, dizziness, palpitations Resp: + shortness of breath with exertion, not at rest.               cough,  + non-productive cough,  No-  coughing up of blood.              No change color of mucus.  No- wheezing.   Skin: No-   rash or lesions. GI:  No-   heartburn, indigestion, abdominal pain, nausea,   GU: No-   dysuria,  MS:  No-   joint pain or swelling.  Neuro-  Psych:  No- change in mood or affect. No acute depression or anxiety.  No memory loss.  OBJ- General- Alert, Oriented, Affect-appropriate, Distress- none acute. + O2 3L , + looks thinner Skin- rash-none, lesions- none, excoriation- none Lymphadenopathy- none Head- atraumatic            Eyes- Gross vision intact, PERRLA, conjunctivae clear secretions            Ears- Hearing, canals-normal            Nose- Clear, no-Septal dev, mucus, polyps, erosion, perforation             Throat- Mallampati II , mucosa clear , drainage- none, tonsils- atrophic.  Neck- flexible , trachea midline, no stridor , thyroid nl, carotid no bruit Chest - symmetrical excursion , unlabored           Heart/CV- RRR , no murmur , no gallop  , no rub, nl s1 s2                           -  JVD- none , edema-none, stasis changes- none, varices- none           Lung-  Distant+, wheeze -none, unlabored, cough+-none , dullness-none, rub- none           Chest wall-  Abd-  Br/ Gen/ Rectal- Not done, not indicated Extrem- cyanosis- none, clubbing, none, atrophy- none, strength- nl.  Neuro- grossly intact to observation

## 2017-08-02 ENCOUNTER — Encounter (HOSPITAL_COMMUNITY): Payer: Self-pay

## 2017-08-04 ENCOUNTER — Encounter (HOSPITAL_COMMUNITY)
Admission: RE | Admit: 2017-08-04 | Discharge: 2017-08-04 | Disposition: A | Discharge status: Home / Self Care | Payer: Self-pay | Source: Ambulatory Visit | Attending: Internal Medicine | Admitting: Internal Medicine

## 2017-08-11 ENCOUNTER — Encounter (HOSPITAL_COMMUNITY)
Admission: RE | Admit: 2017-08-11 | Discharge: 2017-08-11 | Disposition: A | Discharge status: Home / Self Care | Payer: Self-pay | Source: Ambulatory Visit | Attending: Internal Medicine | Admitting: Internal Medicine

## 2017-08-18 ENCOUNTER — Other Ambulatory Visit: Payer: Self-pay | Admitting: Internal Medicine

## 2017-08-18 ENCOUNTER — Encounter (HOSPITAL_COMMUNITY)
Admission: RE | Admit: 2017-08-18 | Discharge: 2017-08-18 | Disposition: A | Discharge status: Home / Self Care | Payer: Self-pay | Source: Ambulatory Visit | Attending: Internal Medicine | Admitting: Internal Medicine

## 2017-08-18 DIAGNOSIS — R0602 Shortness of breath: Secondary | ICD-10-CM | POA: Insufficient documentation

## 2017-08-18 DIAGNOSIS — J449 Chronic obstructive pulmonary disease, unspecified: Secondary | ICD-10-CM | POA: Insufficient documentation

## 2017-08-18 DIAGNOSIS — J984 Other disorders of lung: Secondary | ICD-10-CM | POA: Insufficient documentation

## 2017-08-18 DIAGNOSIS — Z5189 Encounter for other specified aftercare: Secondary | ICD-10-CM | POA: Insufficient documentation

## 2017-08-23 ENCOUNTER — Encounter (HOSPITAL_COMMUNITY): Payer: Self-pay

## 2017-08-23 DIAGNOSIS — L219 Seborrheic dermatitis, unspecified: Secondary | ICD-10-CM | POA: Diagnosis not present

## 2017-08-23 DIAGNOSIS — L821 Other seborrheic keratosis: Secondary | ICD-10-CM | POA: Diagnosis not present

## 2017-08-23 DIAGNOSIS — L570 Actinic keratosis: Secondary | ICD-10-CM | POA: Diagnosis not present

## 2017-08-23 DIAGNOSIS — Z23 Encounter for immunization: Secondary | ICD-10-CM | POA: Diagnosis not present

## 2017-08-25 ENCOUNTER — Encounter (HOSPITAL_COMMUNITY)
Admission: RE | Admit: 2017-08-25 | Discharge: 2017-08-25 | Disposition: A | Discharge status: Home / Self Care | Payer: Self-pay | Source: Ambulatory Visit | Attending: Internal Medicine | Admitting: Internal Medicine

## 2017-08-30 ENCOUNTER — Encounter (HOSPITAL_COMMUNITY): Payer: Self-pay

## 2017-09-01 ENCOUNTER — Encounter (HOSPITAL_COMMUNITY)
Admission: RE | Admit: 2017-09-01 | Discharge: 2017-09-01 | Disposition: A | Discharge status: Home / Self Care | Payer: Self-pay | Source: Ambulatory Visit | Attending: Internal Medicine | Admitting: Internal Medicine

## 2017-09-06 ENCOUNTER — Encounter (HOSPITAL_COMMUNITY)
Admission: RE | Admit: 2017-09-06 | Discharge: 2017-09-06 | Disposition: A | Discharge status: Home / Self Care | Payer: Medicare Other | Source: Ambulatory Visit | Attending: Internal Medicine | Admitting: Internal Medicine

## 2017-09-08 ENCOUNTER — Encounter (HOSPITAL_COMMUNITY): Payer: Self-pay

## 2017-09-13 ENCOUNTER — Encounter (HOSPITAL_COMMUNITY)
Admission: RE | Admit: 2017-09-13 | Discharge: 2017-09-13 | Disposition: A | Discharge status: Home / Self Care | Payer: Self-pay | Source: Ambulatory Visit | Attending: Internal Medicine | Admitting: Internal Medicine

## 2017-09-15 ENCOUNTER — Encounter (HOSPITAL_COMMUNITY)
Admission: RE | Admit: 2017-09-15 | Discharge: 2017-09-15 | Disposition: A | Discharge status: Home / Self Care | Payer: Self-pay | Source: Ambulatory Visit | Attending: Internal Medicine | Admitting: Internal Medicine

## 2017-09-20 ENCOUNTER — Encounter (HOSPITAL_COMMUNITY)
Admission: RE | Admit: 2017-09-20 | Discharge: 2017-09-20 | Disposition: A | Discharge status: Home / Self Care | Payer: Self-pay | Source: Ambulatory Visit | Attending: Internal Medicine | Admitting: Internal Medicine

## 2017-09-20 DIAGNOSIS — Z5189 Encounter for other specified aftercare: Secondary | ICD-10-CM | POA: Insufficient documentation

## 2017-09-20 DIAGNOSIS — J984 Other disorders of lung: Secondary | ICD-10-CM | POA: Insufficient documentation

## 2017-09-20 DIAGNOSIS — J449 Chronic obstructive pulmonary disease, unspecified: Secondary | ICD-10-CM | POA: Insufficient documentation

## 2017-09-20 DIAGNOSIS — R0602 Shortness of breath: Secondary | ICD-10-CM | POA: Insufficient documentation

## 2017-09-22 ENCOUNTER — Encounter (HOSPITAL_COMMUNITY)
Admission: RE | Admit: 2017-09-22 | Discharge: 2017-09-22 | Disposition: A | Discharge status: Home / Self Care | Payer: Self-pay | Source: Ambulatory Visit | Attending: Internal Medicine | Admitting: Internal Medicine

## 2017-09-27 ENCOUNTER — Encounter (HOSPITAL_COMMUNITY): Payer: Self-pay

## 2017-09-29 ENCOUNTER — Encounter (HOSPITAL_COMMUNITY)
Admission: RE | Admit: 2017-09-29 | Discharge: 2017-09-29 | Disposition: A | Discharge status: Home / Self Care | Payer: Self-pay | Source: Ambulatory Visit | Attending: Internal Medicine | Admitting: Internal Medicine

## 2017-10-04 ENCOUNTER — Encounter (HOSPITAL_COMMUNITY)
Admission: RE | Admit: 2017-10-04 | Discharge: 2017-10-04 | Disposition: A | Discharge status: Home / Self Care | Payer: Self-pay | Source: Ambulatory Visit | Attending: Internal Medicine | Admitting: Internal Medicine

## 2017-10-06 ENCOUNTER — Encounter (HOSPITAL_COMMUNITY): Payer: Self-pay

## 2017-10-11 ENCOUNTER — Encounter (HOSPITAL_COMMUNITY)
Admission: RE | Admit: 2017-10-11 | Discharge: 2017-10-11 | Disposition: A | Discharge status: Home / Self Care | Payer: Self-pay | Source: Ambulatory Visit | Attending: Internal Medicine | Admitting: Internal Medicine

## 2017-10-13 ENCOUNTER — Encounter (HOSPITAL_COMMUNITY)
Admission: RE | Admit: 2017-10-13 | Discharge: 2017-10-13 | Disposition: A | Discharge status: Home / Self Care | Payer: Self-pay | Source: Ambulatory Visit | Attending: Internal Medicine | Admitting: Internal Medicine

## 2017-10-18 ENCOUNTER — Encounter (HOSPITAL_COMMUNITY): Payer: Self-pay

## 2017-10-18 DIAGNOSIS — J984 Other disorders of lung: Secondary | ICD-10-CM | POA: Insufficient documentation

## 2017-10-18 DIAGNOSIS — Z5189 Encounter for other specified aftercare: Secondary | ICD-10-CM | POA: Insufficient documentation

## 2017-10-18 DIAGNOSIS — J449 Chronic obstructive pulmonary disease, unspecified: Secondary | ICD-10-CM | POA: Insufficient documentation

## 2017-10-18 DIAGNOSIS — R0602 Shortness of breath: Secondary | ICD-10-CM | POA: Insufficient documentation

## 2017-10-20 ENCOUNTER — Encounter (HOSPITAL_COMMUNITY)
Admission: RE | Admit: 2017-10-20 | Discharge: 2017-10-20 | Disposition: A | Discharge status: Home / Self Care | Payer: Self-pay | Source: Ambulatory Visit | Attending: Internal Medicine | Admitting: Internal Medicine

## 2017-10-25 ENCOUNTER — Encounter (HOSPITAL_COMMUNITY)
Admission: RE | Admit: 2017-10-25 | Discharge: 2017-10-25 | Disposition: A | Discharge status: Home / Self Care | Payer: Self-pay | Source: Ambulatory Visit | Attending: Internal Medicine | Admitting: Internal Medicine

## 2017-10-27 ENCOUNTER — Encounter (HOSPITAL_COMMUNITY): Payer: Self-pay

## 2017-10-31 DIAGNOSIS — I10 Essential (primary) hypertension: Secondary | ICD-10-CM | POA: Diagnosis not present

## 2017-11-01 ENCOUNTER — Encounter (HOSPITAL_COMMUNITY)
Admission: RE | Admit: 2017-11-01 | Discharge: 2017-11-01 | Disposition: A | Discharge status: Home / Self Care | Payer: Self-pay | Source: Ambulatory Visit | Attending: Internal Medicine | Admitting: Internal Medicine

## 2017-11-03 ENCOUNTER — Encounter (HOSPITAL_COMMUNITY): Payer: Self-pay

## 2017-11-03 DIAGNOSIS — D649 Anemia, unspecified: Secondary | ICD-10-CM | POA: Diagnosis not present

## 2017-11-03 DIAGNOSIS — Z79899 Other long term (current) drug therapy: Secondary | ICD-10-CM | POA: Diagnosis not present

## 2017-11-03 DIAGNOSIS — I25119 Atherosclerotic heart disease of native coronary artery with unspecified angina pectoris: Secondary | ICD-10-CM | POA: Diagnosis not present

## 2017-11-03 DIAGNOSIS — E7800 Pure hypercholesterolemia, unspecified: Secondary | ICD-10-CM | POA: Diagnosis not present

## 2017-11-03 DIAGNOSIS — I10 Essential (primary) hypertension: Secondary | ICD-10-CM | POA: Diagnosis not present

## 2017-11-03 DIAGNOSIS — J439 Emphysema, unspecified: Secondary | ICD-10-CM | POA: Diagnosis not present

## 2017-11-03 DIAGNOSIS — N401 Enlarged prostate with lower urinary tract symptoms: Secondary | ICD-10-CM | POA: Diagnosis not present

## 2017-11-03 DIAGNOSIS — Z8679 Personal history of other diseases of the circulatory system: Secondary | ICD-10-CM | POA: Diagnosis not present

## 2017-11-08 ENCOUNTER — Encounter (HOSPITAL_COMMUNITY)
Admission: RE | Admit: 2017-11-08 | Discharge: 2017-11-08 | Disposition: A | Discharge status: Home / Self Care | Payer: Self-pay | Source: Ambulatory Visit | Attending: Internal Medicine | Admitting: Internal Medicine

## 2017-11-10 ENCOUNTER — Encounter (HOSPITAL_COMMUNITY)
Admission: RE | Admit: 2017-11-10 | Discharge: 2017-11-10 | Disposition: A | Discharge status: Home / Self Care | Payer: Self-pay | Source: Ambulatory Visit | Attending: Internal Medicine | Admitting: Internal Medicine

## 2017-11-15 ENCOUNTER — Encounter (HOSPITAL_COMMUNITY)
Admission: RE | Admit: 2017-11-15 | Discharge: 2017-11-15 | Disposition: A | Discharge status: Home / Self Care | Payer: Self-pay | Source: Ambulatory Visit | Attending: Internal Medicine | Admitting: Internal Medicine

## 2017-11-15 DIAGNOSIS — R0602 Shortness of breath: Secondary | ICD-10-CM | POA: Insufficient documentation

## 2017-11-15 DIAGNOSIS — Z5189 Encounter for other specified aftercare: Secondary | ICD-10-CM | POA: Insufficient documentation

## 2017-11-15 DIAGNOSIS — J449 Chronic obstructive pulmonary disease, unspecified: Secondary | ICD-10-CM | POA: Insufficient documentation

## 2017-11-15 DIAGNOSIS — J984 Other disorders of lung: Secondary | ICD-10-CM | POA: Insufficient documentation

## 2017-11-17 ENCOUNTER — Encounter (HOSPITAL_COMMUNITY)
Admission: RE | Admit: 2017-11-17 | Discharge: 2017-11-17 | Disposition: A | Discharge status: Home / Self Care | Payer: Medicare Other | Source: Ambulatory Visit | Attending: Internal Medicine | Admitting: Internal Medicine

## 2017-11-22 ENCOUNTER — Encounter (HOSPITAL_COMMUNITY)
Admission: RE | Admit: 2017-11-22 | Discharge: 2017-11-22 | Disposition: A | Discharge status: Home / Self Care | Payer: Self-pay | Source: Ambulatory Visit | Attending: Internal Medicine | Admitting: Internal Medicine

## 2017-11-24 ENCOUNTER — Encounter (HOSPITAL_COMMUNITY): Payer: Self-pay

## 2017-11-29 ENCOUNTER — Encounter (HOSPITAL_COMMUNITY)
Admission: RE | Admit: 2017-11-29 | Discharge: 2017-11-29 | Disposition: A | Discharge status: Home / Self Care | Payer: Self-pay | Source: Ambulatory Visit | Attending: Internal Medicine | Admitting: Internal Medicine

## 2017-12-01 ENCOUNTER — Encounter (HOSPITAL_COMMUNITY): Payer: Self-pay

## 2017-12-06 ENCOUNTER — Encounter (HOSPITAL_COMMUNITY)
Admission: RE | Admit: 2017-12-06 | Discharge: 2017-12-06 | Disposition: A | Discharge status: Home / Self Care | Payer: Medicare Other | Source: Ambulatory Visit | Attending: Internal Medicine | Admitting: Internal Medicine

## 2017-12-08 ENCOUNTER — Encounter (HOSPITAL_COMMUNITY): Payer: Self-pay

## 2017-12-13 ENCOUNTER — Encounter (HOSPITAL_COMMUNITY): Payer: Self-pay

## 2017-12-15 ENCOUNTER — Encounter (HOSPITAL_COMMUNITY)
Admission: RE | Admit: 2017-12-15 | Discharge: 2017-12-15 | Disposition: A | Discharge status: Home / Self Care | Payer: Self-pay | Source: Ambulatory Visit | Attending: Internal Medicine | Admitting: Internal Medicine

## 2017-12-15 DIAGNOSIS — R0602 Shortness of breath: Secondary | ICD-10-CM | POA: Insufficient documentation

## 2017-12-15 DIAGNOSIS — Z5189 Encounter for other specified aftercare: Secondary | ICD-10-CM | POA: Insufficient documentation

## 2017-12-15 DIAGNOSIS — J984 Other disorders of lung: Secondary | ICD-10-CM | POA: Insufficient documentation

## 2017-12-15 DIAGNOSIS — J449 Chronic obstructive pulmonary disease, unspecified: Secondary | ICD-10-CM | POA: Insufficient documentation

## 2017-12-20 ENCOUNTER — Encounter (HOSPITAL_COMMUNITY)
Admission: RE | Admit: 2017-12-20 | Discharge: 2017-12-20 | Disposition: A | Discharge status: Home / Self Care | Payer: Self-pay | Source: Ambulatory Visit | Attending: Internal Medicine | Admitting: Internal Medicine

## 2017-12-22 ENCOUNTER — Encounter (HOSPITAL_COMMUNITY): Payer: Self-pay

## 2017-12-27 ENCOUNTER — Encounter (HOSPITAL_COMMUNITY): Payer: Self-pay

## 2017-12-29 ENCOUNTER — Encounter (HOSPITAL_COMMUNITY): Payer: Self-pay

## 2018-01-03 ENCOUNTER — Encounter (HOSPITAL_COMMUNITY): Payer: Self-pay

## 2018-01-05 ENCOUNTER — Encounter (HOSPITAL_COMMUNITY): Payer: Self-pay

## 2018-01-05 DIAGNOSIS — R2689 Other abnormalities of gait and mobility: Secondary | ICD-10-CM | POA: Diagnosis not present

## 2018-01-05 DIAGNOSIS — L0390 Cellulitis, unspecified: Secondary | ICD-10-CM | POA: Diagnosis not present

## 2018-01-05 DIAGNOSIS — R296 Repeated falls: Secondary | ICD-10-CM | POA: Diagnosis not present

## 2018-01-05 DIAGNOSIS — B999 Unspecified infectious disease: Secondary | ICD-10-CM | POA: Diagnosis not present

## 2018-01-08 DIAGNOSIS — L03116 Cellulitis of left lower limb: Secondary | ICD-10-CM | POA: Diagnosis not present

## 2018-01-10 ENCOUNTER — Encounter (HOSPITAL_COMMUNITY): Payer: Self-pay

## 2018-01-12 ENCOUNTER — Encounter (HOSPITAL_COMMUNITY): Payer: Self-pay

## 2018-01-17 ENCOUNTER — Encounter (HOSPITAL_COMMUNITY)
Admission: RE | Admit: 2018-01-17 | Discharge: 2018-01-17 | Disposition: A | Discharge status: Home / Self Care | Payer: Self-pay | Source: Ambulatory Visit | Attending: Internal Medicine | Admitting: Internal Medicine

## 2018-01-17 DIAGNOSIS — J984 Other disorders of lung: Secondary | ICD-10-CM | POA: Insufficient documentation

## 2018-01-17 DIAGNOSIS — J449 Chronic obstructive pulmonary disease, unspecified: Secondary | ICD-10-CM | POA: Insufficient documentation

## 2018-01-17 DIAGNOSIS — Z5189 Encounter for other specified aftercare: Secondary | ICD-10-CM | POA: Insufficient documentation

## 2018-01-17 DIAGNOSIS — R0602 Shortness of breath: Secondary | ICD-10-CM | POA: Insufficient documentation

## 2018-01-18 DIAGNOSIS — R609 Edema, unspecified: Secondary | ICD-10-CM | POA: Diagnosis not present

## 2018-01-18 DIAGNOSIS — T148XXA Other injury of unspecified body region, initial encounter: Secondary | ICD-10-CM | POA: Diagnosis not present

## 2018-01-18 DIAGNOSIS — R0981 Nasal congestion: Secondary | ICD-10-CM | POA: Diagnosis not present

## 2018-01-18 DIAGNOSIS — K5900 Constipation, unspecified: Secondary | ICD-10-CM | POA: Diagnosis not present

## 2018-01-19 ENCOUNTER — Encounter (HOSPITAL_COMMUNITY): Payer: Self-pay

## 2018-01-19 ENCOUNTER — Telehealth (HOSPITAL_COMMUNITY): Payer: Self-pay | Admitting: Family Medicine

## 2018-01-24 ENCOUNTER — Encounter (HOSPITAL_COMMUNITY)
Admission: RE | Admit: 2018-01-24 | Discharge: 2018-01-24 | Disposition: A | Discharge status: Home / Self Care | Payer: Self-pay | Source: Ambulatory Visit | Attending: Internal Medicine | Admitting: Internal Medicine

## 2018-01-26 ENCOUNTER — Encounter (HOSPITAL_COMMUNITY): Payer: Self-pay

## 2018-01-26 DIAGNOSIS — T148XXD Other injury of unspecified body region, subsequent encounter: Secondary | ICD-10-CM | POA: Diagnosis not present

## 2018-01-30 ENCOUNTER — Ambulatory Visit (INDEPENDENT_AMBULATORY_CARE_PROVIDER_SITE_OTHER)
Admission: RE | Admit: 2018-01-30 | Discharge: 2018-01-30 | Disposition: A | Discharge status: Home / Self Care | Payer: Medicare Other | Source: Ambulatory Visit | Attending: Internal Medicine | Admitting: Internal Medicine

## 2018-01-30 ENCOUNTER — Telehealth: Payer: Self-pay | Admitting: Internal Medicine

## 2018-01-30 ENCOUNTER — Ambulatory Visit (INDEPENDENT_AMBULATORY_CARE_PROVIDER_SITE_OTHER): Payer: Medicare Other | Admitting: Internal Medicine

## 2018-01-30 ENCOUNTER — Encounter: Payer: Self-pay | Admitting: Internal Medicine

## 2018-01-30 VITALS — BP 134/82 | HR 81 | Ht 69.0 in | Wt 154.6 lb

## 2018-01-30 DIAGNOSIS — R911 Solitary pulmonary nodule: Secondary | ICD-10-CM

## 2018-01-30 DIAGNOSIS — J9611 Chronic respiratory failure with hypoxia: Secondary | ICD-10-CM | POA: Diagnosis not present

## 2018-01-30 DIAGNOSIS — R05 Cough: Secondary | ICD-10-CM | POA: Diagnosis not present

## 2018-01-30 DIAGNOSIS — J449 Chronic obstructive pulmonary disease, unspecified: Secondary | ICD-10-CM

## 2018-01-30 MED ORDER — TIOTROPIUM BROMIDE MONOHYDRATE 2.5 MCG/ACT IN AERS: 2.0000 | INHALATION_SPRAY | Freq: Every day | RESPIRATORY_TRACT | 0 refills | Status: DC

## 2018-01-30 MED ORDER — FLUTICASONE-UMECLIDIN-VILANT 100-62.5-25 MCG/INH IN AEPB: 1.0000 | INHALATION_SPRAY | Freq: Every day | RESPIRATORY_TRACT | 0 refills | Status: DC

## 2018-01-30 NOTE — Patient Instructions (Addendum)
We will see if we can find another provider of liquid portable O2 systems.  Sample Trelegy for another trial- inhale 1 puff then rinse mouth, once daily  Sample Spiriva Respimat 2.5    Inhale 2 puffs daily, to use up, along with your Symbicort  Order CXR dx COPD mixed type

## 2018-01-30 NOTE — Progress Notes (Signed)
Patient ID: [name], male    DOB: 1934-08-28, 83 y.o.   MRN: [id number] PCP Dr Hulan Fess  HPI male former smoker followed for COPD/emphysema, pulmonary nodules, hypoxic respiratory failure, complicated by CAD [id number] 4/49/20-  MM PFT 04/27/11- ------------------------------------------------------------------------------  07/28/17- 83 year old male former smoker followed for COPD/emphysema, pulmonary nodules, hypoxic respiratory failure, complicated by CAD O2 3 L/[facility] ---Pt feels he is having harder time breathing in last 6 months. Pt has more SOB with exertion, needing to use the O2 at 4-5 liters at times con't. Over the last several years he always feels that he has been doing a little worse, but he looks about the same.  He is comfortable on room air at rest but needs oxygen with even minimal exertion and sustained walk requires 4 L.  Little cough, scant white sputum.  Has been out of medication for his nebulizer machine.  Had flu shot.  We talked about available medication types.  He is already on all 3 inhaled medication categories but he is curious about Trelegy now.  01/30/2018- 83 year old male former smoker followed for COPD/emphysema, pulmonary nodules, hypoxic respiratory failure, complicated by CAD O2 3 L/[facility] -----Pt states he is about the same since last visit. Pt states he believes SOB has become worse. Denies any cough or CP. Sensitive to weather changes-currently the hot humid weather.  Currently on an antibiotic for skin infection after a fall.  Little cough or wheeze.  Uses oxygen all the time.  Sometimes needs to turn his liquid system up to 4 L during exertion.  Review of Systems-see HPI   + = positive Constitutional:   No-   weight loss, night sweats fevers, no-chills, fatigue, lassitude. HEENT:   No-  headaches, difficulty swallowing, tooth/dental problems, +sore throat,       No-  sneezing, itching, ear ache, nasal congestion, post nasal drip,  CV:   No-chest pain, no-orthopnea, PND, No-swelling in lower extremities, anasarca, dizziness, palpitations Resp: + shortness of breath with exertion, not at rest.               cough,  + non-productive cough,  No-  coughing up of blood.              No change color of mucus.  No- wheezing.   Skin: No-   rash or lesions. GI:  No-   heartburn, indigestion, abdominal pain, nausea,   GU: No-   dysuria,  MS:  No-   joint pain or swelling.  Neuro-  Psych:  No- change in mood or affect. No acute depression or anxiety.  No memory loss.  OBJ- General- Alert, Oriented, Affect-appropriate, Distress- none acute. + O2 3L , + looks thinner Skin- + light bruising from aspirin Lymphadenopathy- none Head- atraumatic            Eyes- Gross vision intact, PERRLA, conjunctivae clear secretions            Ears- Hearing, canals-normal            Nose- Clear, no-Septal dev, mucus, polyps, erosion, perforation             Throat- Mallampati II , mucosa clear , drainage- none, tonsils- atrophic.  Neck- flexible , trachea midline, no stridor , thyroid nl, carotid no bruit Chest - symmetrical excursion , unlabored           Heart/CV- RRR , no murmur , no gallop  , no rub, nl s1 s2                           -  JVD- none , edema-none, stasis changes- none, varices- none           Lung-  Distant+, wheeze -none, unlabored, cough+light , dullness-none, rub- none           Chest wall-  Abd-  Br/ Gen/ Rectal- Not done, not indicated Extrem- cyanosis- none, clubbing, none, atrophy- none, strength- nl.  Neuro- grossly intact to observation

## 2018-01-30 NOTE — Assessment & Plan Note (Signed)
These appeared to be inflammatory. Plan-CXR

## 2018-01-30 NOTE — Assessment & Plan Note (Signed)
He wants to retry Trelegy with the sample.  Meanwhile, until he runs out of his Symbicort, he asks a sample of Spiriva to use along with that. Plan-samples as requested.  CXR.  I encouraged appropriate exercise to maintain endurance.

## 2018-01-30 NOTE — Assessment & Plan Note (Signed)
Remains oxygen dependent and needs to continuous flow of a liquid system.  He complains that available liquid systems are being recycled and repaired and not maintained well enough.

## 2018-01-30 NOTE — Telephone Encounter (Signed)
Pt was called back and was given results of cxr from today's OV.  Pt expressed understanding with results. Nothing further needed at this time.

## 2018-01-31 ENCOUNTER — Encounter (HOSPITAL_COMMUNITY)
Admission: RE | Admit: 2018-01-31 | Discharge: 2018-01-31 | Disposition: A | Discharge status: Home / Self Care | Payer: Self-pay | Source: Ambulatory Visit | Attending: Internal Medicine | Admitting: Internal Medicine

## 2018-02-02 ENCOUNTER — Encounter (HOSPITAL_COMMUNITY)
Admission: RE | Admit: 2018-02-02 | Discharge: 2018-02-02 | Disposition: A | Discharge status: Home / Self Care | Payer: Self-pay | Source: Ambulatory Visit | Attending: Internal Medicine | Admitting: Internal Medicine

## 2018-02-07 ENCOUNTER — Encounter (HOSPITAL_COMMUNITY)
Admission: RE | Admit: 2018-02-07 | Discharge: 2018-02-07 | Disposition: A | Discharge status: Home / Self Care | Payer: Medicare Other | Source: Ambulatory Visit | Attending: Internal Medicine | Admitting: Internal Medicine

## 2018-02-09 ENCOUNTER — Encounter (HOSPITAL_COMMUNITY): Payer: Self-pay

## 2018-02-09 DIAGNOSIS — R21 Rash and other nonspecific skin eruption: Secondary | ICD-10-CM | POA: Diagnosis not present

## 2018-02-13 ENCOUNTER — Ambulatory Visit: Payer: Medicare Other | Admitting: Physician Assistant

## 2018-02-14 ENCOUNTER — Encounter (HOSPITAL_COMMUNITY)
Admission: RE | Admit: 2018-02-14 | Discharge: 2018-02-14 | Disposition: A | Discharge status: Home / Self Care | Payer: Self-pay | Source: Ambulatory Visit | Attending: Internal Medicine | Admitting: Internal Medicine

## 2018-02-14 DIAGNOSIS — Z5189 Encounter for other specified aftercare: Secondary | ICD-10-CM | POA: Insufficient documentation

## 2018-02-14 DIAGNOSIS — R0602 Shortness of breath: Secondary | ICD-10-CM | POA: Insufficient documentation

## 2018-02-14 DIAGNOSIS — J449 Chronic obstructive pulmonary disease, unspecified: Secondary | ICD-10-CM | POA: Insufficient documentation

## 2018-02-14 DIAGNOSIS — J984 Other disorders of lung: Secondary | ICD-10-CM | POA: Insufficient documentation

## 2018-02-21 DIAGNOSIS — D225 Melanocytic nevi of trunk: Secondary | ICD-10-CM | POA: Diagnosis not present

## 2018-02-21 DIAGNOSIS — L600 Ingrowing nail: Secondary | ICD-10-CM | POA: Diagnosis not present

## 2018-02-21 DIAGNOSIS — Z86018 Personal history of other benign neoplasm: Secondary | ICD-10-CM | POA: Diagnosis not present

## 2018-02-21 DIAGNOSIS — R609 Edema, unspecified: Secondary | ICD-10-CM | POA: Diagnosis not present

## 2018-02-21 DIAGNOSIS — Z883 Allergy status to other anti-infective agents status: Secondary | ICD-10-CM | POA: Diagnosis not present

## 2018-02-21 DIAGNOSIS — Z85828 Personal history of other malignant neoplasm of skin: Secondary | ICD-10-CM | POA: Diagnosis not present

## 2018-02-21 DIAGNOSIS — T1490XD Injury, unspecified, subsequent encounter: Secondary | ICD-10-CM | POA: Diagnosis not present

## 2018-02-22 ENCOUNTER — Encounter (HOSPITAL_BASED_OUTPATIENT_CLINIC_OR_DEPARTMENT_OTHER): Payer: Medicare Other | Attending: Internal Medicine

## 2018-02-23 ENCOUNTER — Encounter (HOSPITAL_COMMUNITY)
Admission: RE | Admit: 2018-02-23 | Discharge: 2018-02-23 | Disposition: A | Discharge status: Home / Self Care | Payer: Self-pay | Source: Ambulatory Visit | Attending: Internal Medicine | Admitting: Internal Medicine

## 2018-02-25 ENCOUNTER — Encounter: Payer: Self-pay | Admitting: Internal Medicine

## 2018-02-28 ENCOUNTER — Encounter (HOSPITAL_COMMUNITY)
Admission: RE | Admit: 2018-02-28 | Discharge: 2018-02-28 | Disposition: A | Discharge status: Home / Self Care | Payer: Self-pay | Source: Ambulatory Visit | Attending: Internal Medicine | Admitting: Internal Medicine

## 2018-02-28 ENCOUNTER — Encounter: Payer: Self-pay | Admitting: Internal Medicine

## 2018-03-01 ENCOUNTER — Encounter: Payer: Self-pay | Admitting: Internal Medicine

## 2018-03-01 MED ORDER — FLUTICASONE-UMECLIDIN-VILANT 100-62.5-25 MCG/INH IN AEPB: 1.0000 | INHALATION_SPRAY | Freq: Every day | RESPIRATORY_TRACT | 11 refills | Status: DC

## 2018-03-02 ENCOUNTER — Encounter (HOSPITAL_COMMUNITY)
Admission: RE | Admit: 2018-03-02 | Discharge: 2018-03-02 | Disposition: A | Discharge status: Home / Self Care | Payer: Self-pay | Source: Ambulatory Visit | Attending: Internal Medicine | Admitting: Internal Medicine

## 2018-03-07 ENCOUNTER — Encounter (HOSPITAL_COMMUNITY)
Admission: RE | Admit: 2018-03-07 | Discharge: 2018-03-07 | Disposition: A | Discharge status: Home / Self Care | Payer: Self-pay | Source: Ambulatory Visit | Attending: Internal Medicine | Admitting: Internal Medicine

## 2018-03-09 ENCOUNTER — Encounter (HOSPITAL_COMMUNITY): Payer: Self-pay | Attending: Internal Medicine

## 2018-03-09 DIAGNOSIS — J984 Other disorders of lung: Secondary | ICD-10-CM | POA: Insufficient documentation

## 2018-03-09 DIAGNOSIS — Z5189 Encounter for other specified aftercare: Secondary | ICD-10-CM | POA: Insufficient documentation

## 2018-03-09 DIAGNOSIS — J449 Chronic obstructive pulmonary disease, unspecified: Secondary | ICD-10-CM | POA: Insufficient documentation

## 2018-03-09 DIAGNOSIS — R0602 Shortness of breath: Secondary | ICD-10-CM | POA: Insufficient documentation

## 2018-03-14 ENCOUNTER — Encounter (HOSPITAL_COMMUNITY): Payer: Self-pay

## 2018-03-14 ENCOUNTER — Other Ambulatory Visit: Payer: Self-pay | Admitting: Internal Medicine

## 2018-03-16 ENCOUNTER — Encounter (HOSPITAL_COMMUNITY): Payer: Self-pay

## 2018-03-16 DIAGNOSIS — Z5189 Encounter for other specified aftercare: Secondary | ICD-10-CM | POA: Insufficient documentation

## 2018-03-16 DIAGNOSIS — R0602 Shortness of breath: Secondary | ICD-10-CM | POA: Insufficient documentation

## 2018-03-16 DIAGNOSIS — J984 Other disorders of lung: Secondary | ICD-10-CM | POA: Insufficient documentation

## 2018-03-16 DIAGNOSIS — J449 Chronic obstructive pulmonary disease, unspecified: Secondary | ICD-10-CM | POA: Insufficient documentation

## 2018-03-21 ENCOUNTER — Encounter (HOSPITAL_COMMUNITY): Payer: Self-pay

## 2018-03-23 ENCOUNTER — Encounter (HOSPITAL_COMMUNITY)
Admission: RE | Admit: 2018-03-23 | Discharge: 2018-03-23 | Disposition: A | Discharge status: Home / Self Care | Payer: Self-pay | Source: Ambulatory Visit | Attending: Internal Medicine | Admitting: Internal Medicine

## 2018-03-27 DIAGNOSIS — H04123 Dry eye syndrome of bilateral lacrimal glands: Secondary | ICD-10-CM | POA: Diagnosis not present

## 2018-03-27 DIAGNOSIS — H40013 Open angle with borderline findings, low risk, bilateral: Secondary | ICD-10-CM | POA: Diagnosis not present

## 2018-03-27 DIAGNOSIS — Z961 Presence of intraocular lens: Secondary | ICD-10-CM | POA: Diagnosis not present

## 2018-03-28 ENCOUNTER — Encounter (HOSPITAL_COMMUNITY)
Admission: RE | Admit: 2018-03-28 | Discharge: 2018-03-28 | Disposition: A | Discharge status: Home / Self Care | Payer: Self-pay | Source: Ambulatory Visit | Attending: Internal Medicine | Admitting: Internal Medicine

## 2018-03-30 ENCOUNTER — Encounter (HOSPITAL_COMMUNITY): Payer: Self-pay

## 2018-04-04 ENCOUNTER — Encounter (HOSPITAL_COMMUNITY)
Admission: RE | Admit: 2018-04-04 | Discharge: 2018-04-04 | Disposition: A | Discharge status: Home / Self Care | Payer: Self-pay | Source: Ambulatory Visit | Attending: Internal Medicine | Admitting: Internal Medicine

## 2018-04-06 ENCOUNTER — Encounter (HOSPITAL_COMMUNITY): Payer: Self-pay

## 2018-04-10 NOTE — Telephone Encounter (Signed)
Okay to send as requested. Thanks.

## 2018-04-11 ENCOUNTER — Encounter (HOSPITAL_COMMUNITY)
Admission: RE | Admit: 2018-04-11 | Discharge: 2018-04-11 | Disposition: A | Discharge status: Home / Self Care | Payer: Medicare Other | Source: Ambulatory Visit | Attending: Internal Medicine | Admitting: Internal Medicine

## 2018-04-11 MED ORDER — AZITHROMYCIN 250 MG PO TABS: 250.0000 mg | ORAL_TABLET | ORAL | 0 refills | Status: DC

## 2018-04-11 MED ORDER — PREDNISONE 10 MG PO TABS: ORAL_TABLET | ORAL | 0 refills | Status: DC

## 2018-04-12 DIAGNOSIS — M79605 Pain in left leg: Secondary | ICD-10-CM | POA: Diagnosis not present

## 2018-04-13 ENCOUNTER — Other Ambulatory Visit: Payer: Self-pay | Admitting: Internal Medicine

## 2018-04-13 ENCOUNTER — Encounter (HOSPITAL_COMMUNITY): Payer: Self-pay

## 2018-04-18 ENCOUNTER — Encounter (HOSPITAL_COMMUNITY)
Admission: RE | Admit: 2018-04-18 | Discharge: 2018-04-18 | Disposition: A | Discharge status: Home / Self Care | Payer: Self-pay | Source: Ambulatory Visit | Attending: Internal Medicine | Admitting: Internal Medicine

## 2018-04-18 DIAGNOSIS — J984 Other disorders of lung: Secondary | ICD-10-CM | POA: Insufficient documentation

## 2018-04-18 DIAGNOSIS — J449 Chronic obstructive pulmonary disease, unspecified: Secondary | ICD-10-CM | POA: Insufficient documentation

## 2018-04-18 DIAGNOSIS — R0602 Shortness of breath: Secondary | ICD-10-CM | POA: Insufficient documentation

## 2018-04-18 DIAGNOSIS — Z5189 Encounter for other specified aftercare: Secondary | ICD-10-CM | POA: Insufficient documentation

## 2018-04-19 DIAGNOSIS — Z23 Encounter for immunization: Secondary | ICD-10-CM | POA: Diagnosis not present

## 2018-04-20 ENCOUNTER — Encounter (HOSPITAL_COMMUNITY): Payer: Self-pay

## 2018-04-25 ENCOUNTER — Encounter (HOSPITAL_COMMUNITY): Payer: Self-pay

## 2018-04-27 ENCOUNTER — Encounter (HOSPITAL_COMMUNITY): Payer: Self-pay

## 2018-05-02 ENCOUNTER — Encounter (HOSPITAL_COMMUNITY): Payer: Self-pay

## 2018-05-04 ENCOUNTER — Encounter (HOSPITAL_COMMUNITY): Payer: Self-pay

## 2018-05-08 DIAGNOSIS — I10 Essential (primary) hypertension: Secondary | ICD-10-CM | POA: Diagnosis not present

## 2018-05-09 ENCOUNTER — Encounter (HOSPITAL_COMMUNITY)
Admission: RE | Admit: 2018-05-09 | Discharge: 2018-05-09 | Disposition: A | Discharge status: Home / Self Care | Payer: Self-pay | Source: Ambulatory Visit | Attending: Internal Medicine | Admitting: Internal Medicine

## 2018-05-09 DIAGNOSIS — R7989 Other specified abnormal findings of blood chemistry: Secondary | ICD-10-CM | POA: Diagnosis not present

## 2018-05-11 ENCOUNTER — Encounter (HOSPITAL_COMMUNITY): Payer: Self-pay

## 2018-05-11 DIAGNOSIS — M4854XA Collapsed vertebra, not elsewhere classified, thoracic region, initial encounter for fracture: Secondary | ICD-10-CM | POA: Diagnosis not present

## 2018-05-11 DIAGNOSIS — I10 Essential (primary) hypertension: Secondary | ICD-10-CM | POA: Diagnosis not present

## 2018-05-11 DIAGNOSIS — I25119 Atherosclerotic heart disease of native coronary artery with unspecified angina pectoris: Secondary | ICD-10-CM | POA: Diagnosis not present

## 2018-05-11 DIAGNOSIS — D649 Anemia, unspecified: Secondary | ICD-10-CM | POA: Diagnosis not present

## 2018-05-11 DIAGNOSIS — M5416 Radiculopathy, lumbar region: Secondary | ICD-10-CM | POA: Diagnosis not present

## 2018-05-11 DIAGNOSIS — Z0000 Encounter for general adult medical examination without abnormal findings: Secondary | ICD-10-CM | POA: Diagnosis not present

## 2018-05-11 DIAGNOSIS — Z8679 Personal history of other diseases of the circulatory system: Secondary | ICD-10-CM | POA: Diagnosis not present

## 2018-05-11 DIAGNOSIS — N401 Enlarged prostate with lower urinary tract symptoms: Secondary | ICD-10-CM | POA: Diagnosis not present

## 2018-05-11 DIAGNOSIS — J449 Chronic obstructive pulmonary disease, unspecified: Secondary | ICD-10-CM | POA: Diagnosis not present

## 2018-05-11 DIAGNOSIS — E7800 Pure hypercholesterolemia, unspecified: Secondary | ICD-10-CM | POA: Diagnosis not present

## 2018-05-16 ENCOUNTER — Encounter (HOSPITAL_COMMUNITY): Payer: Self-pay | Attending: Internal Medicine

## 2018-05-16 DIAGNOSIS — J449 Chronic obstructive pulmonary disease, unspecified: Secondary | ICD-10-CM | POA: Insufficient documentation

## 2018-05-16 DIAGNOSIS — J984 Other disorders of lung: Secondary | ICD-10-CM | POA: Insufficient documentation

## 2018-05-16 DIAGNOSIS — R0602 Shortness of breath: Secondary | ICD-10-CM | POA: Insufficient documentation

## 2018-05-16 DIAGNOSIS — Z5189 Encounter for other specified aftercare: Secondary | ICD-10-CM | POA: Insufficient documentation

## 2018-05-18 ENCOUNTER — Ambulatory Visit (INDEPENDENT_AMBULATORY_CARE_PROVIDER_SITE_OTHER): Payer: Medicare Other | Admitting: Adult Health

## 2018-05-18 ENCOUNTER — Ambulatory Visit (INDEPENDENT_AMBULATORY_CARE_PROVIDER_SITE_OTHER)
Admission: RE | Admit: 2018-05-18 | Discharge: 2018-05-18 | Disposition: A | Discharge status: Home / Self Care | Payer: Medicare Other | Source: Ambulatory Visit | Attending: Adult Health | Admitting: Adult Health

## 2018-05-18 ENCOUNTER — Encounter (HOSPITAL_COMMUNITY): Payer: Self-pay

## 2018-05-18 ENCOUNTER — Encounter: Payer: Self-pay | Admitting: Adult Health

## 2018-05-18 VITALS — BP 116/62 | HR 94 | Temp 97.8°F | Ht 69.0 in | Wt 150.6 lb

## 2018-05-18 DIAGNOSIS — R0602 Shortness of breath: Secondary | ICD-10-CM | POA: Diagnosis not present

## 2018-05-18 DIAGNOSIS — J441 Chronic obstructive pulmonary disease with (acute) exacerbation: Secondary | ICD-10-CM

## 2018-05-18 DIAGNOSIS — M79605 Pain in left leg: Secondary | ICD-10-CM | POA: Diagnosis not present

## 2018-05-18 DIAGNOSIS — R05 Cough: Secondary | ICD-10-CM | POA: Diagnosis not present

## 2018-05-18 DIAGNOSIS — M7989 Other specified soft tissue disorders: Secondary | ICD-10-CM | POA: Diagnosis not present

## 2018-05-18 DIAGNOSIS — J9611 Chronic respiratory failure with hypoxia: Secondary | ICD-10-CM | POA: Diagnosis not present

## 2018-05-18 DIAGNOSIS — J181 Lobar pneumonia, unspecified organism: Secondary | ICD-10-CM | POA: Diagnosis not present

## 2018-05-18 DIAGNOSIS — J189 Pneumonia, unspecified organism: Secondary | ICD-10-CM | POA: Insufficient documentation

## 2018-05-18 MED ORDER — AZITHROMYCIN 250 MG PO TABS: 250.0000 mg | ORAL_TABLET | Freq: Every day | ORAL | 0 refills | Status: DC

## 2018-05-18 MED ORDER — PREDNISONE 10 MG PO TABS: ORAL_TABLET | ORAL | 0 refills | Status: DC

## 2018-05-18 NOTE — Assessment & Plan Note (Signed)
Cont on o2 .  

## 2018-05-18 NOTE — Progress Notes (Signed)
@Patient  ID: [name], male    DOB: Sep 06, 1934, 83 y.o.   MRN: [id number]  Chief Complaint  Patient presents with  . Acute Visit    COPD    Referring provider: Hulan Fess, MD  HPI: 83 year old male former smoker followed for COPD/emphysema, hypoxic respiratory failure on oxygen Medical history significant for coronary artery disease  TEST  PFT 2012> FEV1 23%, ratio 27, FVC 63%, DLCO 37% a1AT 05/04/12-  MM Echo 2015 EF 50 to 55%, RV moderately dilated CT chest July 2017> resolution of pulmonary nodule left upper lobe.  Remaining nodules stable as 2013   05/18/2018 Acute OV : COPD  Patient presents for an acute office visit.  He complains of 5 days of increased cough, congestion, wheezing, and shortness of breath.  Patient has had increased fatigue and thick green mucus.  No fever.  But has been fine.  Nausea vomiting or diarrhea patient has been using Mucinex over-the-counter.  Patient had a Z-Pak at home and started it 2 days ago.  Does feel that his mucus is starting to clear more.  Continues have cough and wheezing.   He remains on Symbicort and Spiriva.  He uses DuoNeb as needed. Patient remains on 3 L of oxygen..   Following with PCP for "pinched nerve" in left leg. Rx Prednisone taper. Has 1 day left.  Left leg pain since August after trip to [facility].  Has chronic leg swelling and does not feel like it is been worse than usual.  Denies any calf pain.  Has pain in the back of his thigh. No redness.    Allergies  Allergen Reactions  . Tolectin [Tolmetin Sodium] Other (See Comments)    BP low and passed out  . Augmentin [Amoxicillin-Pot Clavulanate] Nausea And Vomiting  . Montelukast Sodium     Immunization History  Administered Date(s) Administered  . Influenza Split 04/16/2011, 05/04/2012, 04/16/2013, 06/13/2014, 04/20/2017  . Influenza, High Dose Seasonal PF 04/16/2018  . Influenza,inj,Quad PF,6+ Mos 03/31/2015, 04/15/2016  . Pneumococcal Conjugate-13  06/05/2013  . Pneumococcal Polysaccharide-23 01/23/1993, 04/17/1999  . Tdap 08/25/2010  . Zoster 07/14/2005    Past Medical History:  Diagnosis Date  . Allergic rhinitis, cause unspecified   . COPD (chronic obstructive pulmonary disease) ([place])   . Emphysema of lung ([place])   . Pulmonary nodule     Tobacco History: Social History   Tobacco Use  Smoking Status Former Smoker  . Last attempt to quit: 08/16/1990  . Years since quitting: 27.7  Smokeless Tobacco Never Used   Counseling given: Not Answered   Outpatient Medications Prior to Visit  Medication Sig Dispense Refill  . albuterol (PROAIR HFA) 108 (90 BASE) MCG/ACT inhaler Inhale 2 puffs into the lungs 4 (four) times daily as needed for wheezing or shortness of breath. 1 Inhaler 5  . aspirin EC 81 MG tablet Take 1 tablet (81 mg total) by mouth daily.    [name] azithromycin (ZITHROMAX) 250 MG tablet Take 1 tablet (250 mg total) by mouth as directed. 6 tablet 0  . digoxin (LANOXIN) 0.25 MG tablet Take 250 mcg by mouth daily.      [name] ipratropium-albuterol (DUONEB) 0.5-2.5 (3) MG/3ML SOLN Take 3 mLs by nebulization every 6 (six) hours as needed. 75 mL 12  . losartan (COZAAR) 25 MG tablet Take 1 tablet (25 mg total) daily by mouth. 90 tablet 3  . OXYGEN Inhale into the lungs.    . pravastatin (PRAVACHOL) 40 MG tablet Take 1  tablet by mouth daily.    . predniSONE (DELTASONE) 10 MG tablet 4 X 2 DAYS, 3 X 2 DAYS, 2 X 2 DAYS, 1 X 2 DAYS 20 tablet 0  . SPIRIVA RESPIMAT 2.5 MCG/ACT AERS INHALE 2 PUFFS BY MOUTH EVERY DAY 4 g 11  . SYMBICORT 160-4.5 MCG/ACT inhaler INHALE 2 PUFFS BY MOUTH TWICE DAILY. RINSE MOUTH 10.2 g 5  . tamsulosin (FLOMAX) 0.4 MG CAPS capsule Take 1 capsule by mouth daily.  2  . cephALEXin (KEFLEX) 500 MG capsule TK 1 C PO Q 8 H FOR 10 DAYS  1  . Fluticasone-Umeclidin-Vilant (TRELEGY ELLIPTA) 100-62.5-25 MCG/INH AEPB Inhale 1 puff into the lungs daily. (Patient not taking: Reported on 05/18/2018) 60 each 11  . Tiotropium  Bromide Monohydrate (SPIRIVA RESPIMAT) 2.5 MCG/ACT AERS Inhale 2 puffs into the lungs daily. (Patient not taking: Reported on 05/18/2018) 1 Inhaler 0   No facility-administered medications prior to visit.      Review of Systems  Constitutional:   No  weight loss, night sweats,  Fevers, chills,  +fatigue, or  lassitude.  HEENT:   No headaches,  Difficulty swallowing,  Tooth/dental problems, or  Sore throat,                No sneezing, itching, ear ache, nasal congestion, post nasal drip,   CV:  No chest pain,  Orthopnea, PND,  anasarca, dizziness, palpitations, syncope.   GI  No heartburn, indigestion, abdominal pain, nausea, vomiting, diarrhea, change in bowel habits, loss of appetite, bloody stools.   Resp:   No chest wall deformity  Skin: no rash or lesions.  GU: no dysuria, change in color of urine, no urgency or frequency.  No flank pain, no hematuria   MS:  No joint pain or swelling.  No decreased range of motion.  No back pain.    Physical Exam  BP 116/62 (BP Location: Left Arm, Cuff Size: Normal)   Pulse 94   Temp 97.8 F (36.6 C) (Oral)   Ht 5\' 9"  (1.753 m)   Wt 150 lb 9.6 oz (68.3 kg)   SpO2 98%   BMI 22.24 kg/m   GEN: A/Ox3; pleasant , NAD, well nourished    HEENT:  [place]/AT,  EACs-clear, TMs-wnl, NOSE-clear, THROAT-clear, no lesions, no postnasal drip or exudate noted.   NECK:  Supple w/ fair ROM; no JVD; normal carotid impulses w/o bruits; no thyromegaly or nodules palpated; no lymphadenopathy.    RESP few scattered rhonchi, expiratory wheezes  no accessory muscle use, no dullness to percussion  CARD:  RRR, no m/r/g, 1+ peripheral edema, pulses intact, no cyanosis or clubbing. Positive Homans sign on left  GI:   Soft & nt; nml bowel sounds; no organomegaly or masses detected.   Musco: Warm bil, no deformities or joint swelling noted.   Neuro: alert, no focal deficits noted.    Skin: Warm, no lesions or rashes    Lab Results:  CBC  BMET No  results found for: NA, K, CL, CO2, GLUCOSE, BUN, CREATININE, CALCIUM, GFRNONAA, GFRAA  BNP No results found for: BNP  ProBNP No results found for: PROBNP  Imaging: Dg Chest 2 View  Result Date: 05/18/2018 CLINICAL DATA:  Shortness of breath, chest congestion, and cough for the past week. Former smoker. History of COPD. EXAM: CHEST - 2 VIEW COMPARISON:  Chest x-ray of January 30, 2018 FINDINGS: The lungs are hyperinflated with hemidiaphragm flattening. Slightly increased lung markings are noted in the left lower lobe and  are more conspicuous than in the past. The heart and pulmonary vascularity are normal. The mediastinum is normal in width. There is calcification in the wall of the thoracic aorta. The bony thorax exhibits no acute abnormality. There is chronic partial compression of the body of T11. IMPRESSION: COPD. Subsegmental atelectasis or early pneumonia in the left lower lobe posteriorly. Followup PA and lateral chest X-ray is recommended in 3-4 weeks following trial of antibiotic therapy to ensure resolution and exclude underlying malignancy. Thoracic aortic atherosclerosis. Electronically Signed   By: David  Martinique M.D.   On: 05/18/2018 15:17     Assessment & Plan:   COPD with acute exacerbation Slow to resolve exacerbation +/- LLL PNA  Chest x-ray today shows LLL atx vs infiltrate   Plan Patient Instructions  Extend Zithromax daily for 2 days. Mucinex DM twice daily as needed for cough and congestion Prednisone taper.  Take with food Continue on Symbicort and Spiriva Use DuoNeb as needed Continue on  on oxygen 3 L Set up venous Doppler of legs  Follow-up in 1 week with Dr. Annamaria Boots or Greogry Goodwyn NP  Please contact office for sooner follow up if symptoms do not improve or worsen or seek emergency care        Left leg pain Left upper leg/posterior thigh pain questionable etiology has been going on since August 2019 Patient has some risk factors for DVT including recent long auto  travel, chronic lower extremity edema, sedentary lifestyle.  Positive Homans sign on exam.  Check venous Doppler .    Pneumonia LLL PNA -  clincially stable w/ good food and fluid intake  Will cont OP tx  Close follow up   Plan  . Patient Instructions  Extend Zithromax daily for 2 days. Mucinex DM twice daily as needed for cough and congestion Prednisone taper.  Take with food Continue on Symbicort and Spiriva Use DuoNeb as needed Continue on  on oxygen 3 L Set up venous Doppler of legs  Follow-up in 1 week with Dr. Annamaria Boots or Harout Scheurich NP  Please contact office for sooner follow up if symptoms do not improve or worsen or seek emergency care        Chronic respiratory failure with hypoxia Cont on o2      Nellie Pester, NP 05/18/2018

## 2018-05-18 NOTE — Assessment & Plan Note (Addendum)
LLL PNA -  clincially stable w/ good food and fluid intake  Will cont OP tx  Close follow up   Plan  . Patient Instructions  Extend Zithromax daily for 2 days. Mucinex DM twice daily as needed for cough and congestion Prednisone taper.  Take with food Continue on Symbicort and Spiriva Use DuoNeb as needed Continue on  on oxygen 3 L Set up venous Doppler of legs  Follow-up in 1 week with Dr. Annamaria Boots or Kyelle Urbas NP  Please contact office for sooner follow up if symptoms do not improve or worsen or seek emergency care

## 2018-05-18 NOTE — Assessment & Plan Note (Signed)
Left upper leg/posterior thigh pain questionable etiology has been going on since August 2019 Patient has some risk factors for DVT including recent long auto travel, chronic lower extremity edema, sedentary lifestyle.  Positive Homans sign on exam.  Check venous Doppler .

## 2018-05-18 NOTE — Patient Instructions (Addendum)
Extend Zithromax daily for 2 days. Mucinex DM twice daily as needed for cough and congestion Prednisone taper.  Take with food Continue on Symbicort and Spiriva Use DuoNeb as needed Continue on  on oxygen 3 L Set up venous Doppler of legs  Follow-up in 1 week with Dr. Annamaria Boots or Parrett NP  Please contact office for sooner follow up if symptoms do not improve or worsen or seek emergency care

## 2018-05-18 NOTE — Assessment & Plan Note (Addendum)
Slow to resolve exacerbation +/- LLL PNA  Chest x-ray today shows LLL atx vs infiltrate   Plan Patient Instructions  Extend Zithromax daily for 2 days. Mucinex DM twice daily as needed for cough and congestion Prednisone taper.  Take with food Continue on Symbicort and Spiriva Use DuoNeb as needed Continue on  on oxygen 3 L Set up venous Doppler of legs  Follow-up in 1 week with Dr. Annamaria Boots or Rondell Pardon NP  Please contact office for sooner follow up if symptoms do not improve or worsen or seek emergency care

## 2018-05-19 ENCOUNTER — Ambulatory Visit (HOSPITAL_COMMUNITY)
Admission: RE | Admit: 2018-05-19 | Discharge: 2018-05-19 | Disposition: A | Discharge status: Home / Self Care | Payer: Medicare Other | Source: Ambulatory Visit | Attending: Cardiology | Admitting: Cardiology

## 2018-05-19 DIAGNOSIS — M7989 Other specified soft tissue disorders: Secondary | ICD-10-CM | POA: Diagnosis not present

## 2018-05-22 ENCOUNTER — Other Ambulatory Visit: Payer: Self-pay | Admitting: Adult Health

## 2018-05-22 DIAGNOSIS — R0602 Shortness of breath: Secondary | ICD-10-CM

## 2018-05-22 DIAGNOSIS — J181 Lobar pneumonia, unspecified organism: Principal | ICD-10-CM

## 2018-05-22 DIAGNOSIS — J189 Pneumonia, unspecified organism: Secondary | ICD-10-CM

## 2018-05-22 DIAGNOSIS — M79605 Pain in left leg: Secondary | ICD-10-CM | POA: Diagnosis not present

## 2018-05-22 DIAGNOSIS — M5416 Radiculopathy, lumbar region: Secondary | ICD-10-CM | POA: Diagnosis not present

## 2018-05-22 NOTE — Progress Notes (Signed)
Had spoken with patient earlier this afternoon.  Pt voiced understanding about Doppler results.  Pt stated still having "pneumonia symptoms".  Pt is scheduled to see TP tomorrow 10/8 at 12N.  Advised patient will speak with TP.  Per TP: let's do cxr and CBCD, BMET and BNP prior to visit.  Called pt - was speaking with him when the line disconnected. ATC pt again, was directed to VM.  Left detailed message on VM informing him of TP's recommendations.   Orders only encounter created for the cxr, CBCD, BMET, BNP orders - placed STAT.

## 2018-05-23 ENCOUNTER — Encounter (HOSPITAL_COMMUNITY): Payer: Self-pay | Admitting: *Deleted

## 2018-05-23 ENCOUNTER — Encounter: Payer: Self-pay | Admitting: Adult Health

## 2018-05-23 ENCOUNTER — Ambulatory Visit (INDEPENDENT_AMBULATORY_CARE_PROVIDER_SITE_OTHER): Payer: Medicare Other | Admitting: Adult Health

## 2018-05-23 ENCOUNTER — Other Ambulatory Visit (INDEPENDENT_AMBULATORY_CARE_PROVIDER_SITE_OTHER): Payer: Medicare Other

## 2018-05-23 ENCOUNTER — Ambulatory Visit (INDEPENDENT_AMBULATORY_CARE_PROVIDER_SITE_OTHER)
Admission: RE | Admit: 2018-05-23 | Discharge: 2018-05-23 | Disposition: A | Discharge status: Home / Self Care | Payer: Medicare Other | Source: Ambulatory Visit | Attending: Adult Health | Admitting: Adult Health

## 2018-05-23 ENCOUNTER — Encounter (HOSPITAL_COMMUNITY): Payer: Self-pay

## 2018-05-23 ENCOUNTER — Telehealth: Payer: Self-pay | Admitting: Adult Health

## 2018-05-23 VITALS — BP 136/70 | HR 98 | Ht 69.0 in | Wt 148.8 lb

## 2018-05-23 DIAGNOSIS — J441 Chronic obstructive pulmonary disease with (acute) exacerbation: Secondary | ICD-10-CM | POA: Diagnosis not present

## 2018-05-23 DIAGNOSIS — J449 Chronic obstructive pulmonary disease, unspecified: Secondary | ICD-10-CM

## 2018-05-23 DIAGNOSIS — R0602 Shortness of breath: Secondary | ICD-10-CM

## 2018-05-23 DIAGNOSIS — J189 Pneumonia, unspecified organism: Secondary | ICD-10-CM

## 2018-05-23 DIAGNOSIS — J9611 Chronic respiratory failure with hypoxia: Secondary | ICD-10-CM

## 2018-05-23 DIAGNOSIS — J181 Lobar pneumonia, unspecified organism: Secondary | ICD-10-CM

## 2018-05-23 LAB — BASIC METABOLIC PANEL
BUN: 17 mg/dL (ref 6–23)
CO2: 33 mEq/L — ABNORMAL HIGH (ref 19–32)
Calcium: 9.4 mg/dL (ref 8.4–10.5)
Chloride: 94 mEq/L — ABNORMAL LOW (ref 96–112)
Creatinine, Ser: 0.56 mg/dL (ref 0.40–1.50)
GFR: 148.82 mL/min (ref >60.00)
Glucose, Bld: 156 mg/dL — ABNORMAL HIGH (ref 70–99)
Potassium: 4.2 mEq/L (ref 3.5–5.1)
Sodium: 133 mEq/L — ABNORMAL LOW (ref 135–145)

## 2018-05-23 LAB — CBC WITH DIFFERENTIAL/PLATELET
Basophils Absolute: 0 10*3/uL (ref 0.0–0.1)
Basophils Relative: 0.3 % (ref 0.0–3.0)
Eosinophils Absolute: 0.1 10*3/uL (ref 0.0–0.7)
Eosinophils Relative: 0.6 % (ref 0.0–5.0)
HCT: 40.2 % (ref 39.0–52.0)
Hemoglobin: 13.2 g/dL (ref 13.0–17.0)
Lymphocytes Relative: 4.5 % — ABNORMAL LOW (ref 12.0–46.0)
Lymphs Abs: 0.6 10*3/uL — ABNORMAL LOW (ref 0.7–4.0)
MCHC: 32.8 g/dL (ref 30.0–36.0)
MCV: 86.2 fl (ref 78.0–100.0)
Monocytes Absolute: 0.7 10*3/uL (ref 0.1–1.0)
Monocytes Relative: 4.9 % (ref 3.0–12.0)
Neutro Abs: 12.4 10*3/uL — ABNORMAL HIGH (ref 1.4–7.7)
Neutrophils Relative %: 89.7 % — ABNORMAL HIGH (ref 43.0–77.0)
Platelets: 290 10*3/uL (ref 150.0–400.0)
RBC: 4.67 Mil/uL (ref 4.22–5.81)
RDW: 13.9 % (ref 11.5–15.5)
WBC: 13.9 10*3/uL — ABNORMAL HIGH (ref 4.0–10.5)

## 2018-05-23 LAB — BRAIN NATRIURETIC PEPTIDE: Pro B Natriuretic peptide (BNP): 109 pg/mL — ABNORMAL HIGH (ref 0.0–100.0)

## 2018-05-23 MED ORDER — PREDNISONE 10 MG PO TABS: 10.0000 mg | ORAL_TABLET | Freq: Every day | ORAL | 1 refills | Status: DC

## 2018-05-23 MED ORDER — FLUTTER DEVI: 0 refills | Status: DC

## 2018-05-23 MED ORDER — LEVALBUTEROL HCL 0.63 MG/3ML IN NEBU
0.6300 mg | INHALATION_SOLUTION | Freq: Once | RESPIRATORY_TRACT | Status: AC
  Administered 2018-05-23: 0.63 mg via RESPIRATORY_TRACT

## 2018-05-23 MED ORDER — DOXYCYCLINE HYCLATE 100 MG PO TABS: 100.0000 mg | ORAL_TABLET | Freq: Two times a day (BID) | ORAL | 0 refills | Status: DC

## 2018-05-23 NOTE — Progress Notes (Signed)
[name] called and has decided to drop from the [place]. Pulmonary  Maintenance program. He states that he is having pain in his leg that and the doctor feels like he is going to need Physical Therapy. He also has pneumonia now. I encouraged him to contact us when he feels that he will be able to restart.

## 2018-05-23 NOTE — Telephone Encounter (Signed)
Called and spoke with pt to see if he knew the reason for Jessica's call to him yesterday.  Pt stated that in the message, Janett Billow advised pt to come to the office early to have a chest xray and blood work before coming up for his appt.  Pt stated he will be to the office early for the xray and bloodwork. Nothing further needed.

## 2018-05-23 NOTE — Progress Notes (Signed)
@Patient  ID: [name], male    DOB: 10-19-1934, 83 y.o.   MRN: [id number]  Chief Complaint  Patient presents with  . Follow-up    PNA     Referring provider: Hulan Fess, MD  HPI: 83 year old male former smoker followed for COPD/emphysema, hypoxic respiratory failure on oxygen Medical history significant for coronary artery disease  TEST  PFT 2012> FEV1 23%, ratio 27, FVC 63%, DLCO 37% a1AT 05/04/12- MM Echo 2015 EF 50 to 55%, RV moderately dilated CT chest July 2017> resolution of pulmonary nodule left upper lobe.  Remaining nodules stable as 2013   05/23/2018 Follow up: PNA /COPD  Patient presents for a one-week follow-up.  Patient was seen last week increased cough and congestion.  Chest x-ray showed a probable left lower lobe pneumonia with left lower lobe infiltrate.  He had already started Zithromax at home.  He was extended for a total 7-day course.  And given a prednisone taper for COPD exacerbation with cough and wheezing.  Patient says he is slightly improved.  But is continues to feel very weak with ongoing thick mucus that is yellow to green.  He continues to be short of breath and have intermittent wheezing.  He remains on Symbicort and Spiriva.  Patient is on oxygen 3 L at rest and 4 L with walking.  Patient's O2 saturations have been good and he has had no increased oxygen demands.  Patient says his mucus is very thick.  Hard to get up at times. Chest x-ray today shows no significant change in left lower lobe density. Appetite is fair.  He has no nausea vomiting or diarrhea.  Patient was also having increased leg swelling.  He is been having some back issues.  Patient was set up for a venous Doppler that was negative for DVT.  He has been following with orthopedics..   Allergies  Allergen Reactions  . Tolectin [Tolmetin Sodium] Other (See Comments)    BP low and passed out  . Augmentin [Amoxicillin-Pot Clavulanate] Nausea And Vomiting  . Montelukast  Sodium     Immunization History  Administered Date(s) Administered  . Influenza Split 04/16/2011, 05/04/2012, 04/16/2013, 06/13/2014, 04/20/2017  . Influenza, High Dose Seasonal PF 04/16/2018  . Influenza,inj,Quad PF,6+ Mos 03/31/2015, 04/15/2016  . Pneumococcal Conjugate-13 06/05/2013  . Pneumococcal Polysaccharide-23 01/23/1993, 04/17/1999  . Tdap 08/25/2010  . Zoster 07/14/2005    Past Medical History:  Diagnosis Date  . Allergic rhinitis, cause unspecified   . COPD (chronic obstructive pulmonary disease) ([place])   . Emphysema of lung ([place])   . Pulmonary nodule     Tobacco History: Social History   Tobacco Use  Smoking Status Former Smoker  . Last attempt to quit: 08/16/1990  . Years since quitting: 27.7  Smokeless Tobacco Never Used   Counseling given: Not Answered   Outpatient Medications Prior to Visit  Medication Sig Dispense Refill  . albuterol (PROAIR HFA) 108 (90 BASE) MCG/ACT inhaler Inhale 2 puffs into the lungs 4 (four) times daily as needed for wheezing or shortness of breath. 1 Inhaler 5  . aspirin EC 81 MG tablet Take 1 tablet (81 mg total) by mouth daily.    . digoxin (LANOXIN) 0.25 MG tablet Take 250 mcg by mouth daily.      [name] ipratropium-albuterol (DUONEB) 0.5-2.5 (3) MG/3ML SOLN Take 3 mLs by nebulization every 6 (six) hours as needed. 75 mL 12  . losartan (COZAAR) 25 MG tablet Take 1 tablet (25 mg total) daily  by mouth. 90 tablet 3  . OXYGEN Inhale into the lungs.    . pravastatin (PRAVACHOL) 40 MG tablet Take 1 tablet by mouth daily.    . predniSONE (DELTASONE) 10 MG tablet 4 X 2 DAYS, 3 X 2 DAYS, 2 X 2 DAYS, 1 X 2 DAYS 20 tablet 0  . predniSONE (DELTASONE) 10 MG tablet 4 tabs for 3 days, then 3 tabs for 3 days, 2 tabs for 3 days, then 1 tab for 3 days, then stop 30 tablet 0  . SPIRIVA RESPIMAT 2.5 MCG/ACT AERS INHALE 2 PUFFS BY MOUTH EVERY DAY 4 g 11  . SYMBICORT 160-4.5 MCG/ACT inhaler INHALE 2 PUFFS BY MOUTH TWICE DAILY. RINSE MOUTH 10.2 g 5  .  tamsulosin (FLOMAX) 0.4 MG CAPS capsule Take 1 capsule by mouth daily.  2  . azithromycin (ZITHROMAX Z-PAK) 250 MG tablet Take 1 tablet (250 mg total) by mouth daily. (Patient not taking: Reported on 05/23/2018) 2 each 0  . azithromycin (ZITHROMAX) 250 MG tablet Take 1 tablet (250 mg total) by mouth as directed. (Patient not taking: Reported on 05/23/2018) 6 tablet 0   No facility-administered medications prior to visit.      Review of Systems  Constitutional:   No  weight loss, night sweats,  Fevers, chills, + fatigue, or  lassitude.  HEENT:   No headaches,  Difficulty swallowing,  Tooth/dental problems, or  Sore throat,                No sneezing, itching, ear ache, nasal congestion, post nasal drip,   CV:  No chest pain,  Orthopnea, PND, swelling in lower extremities, anasarca, dizziness, palpitations, syncope.   GI  No heartburn, indigestion, abdominal pain, nausea, vomiting, diarrhea, change in bowel habits, loss of appetite, bloody stools.   Resp:  .  No chest wall deformity  Skin: no rash or lesions.  GU: no dysuria, change in color of urine, no urgency or frequency.  No flank pain, no hematuria   MS:  No joint pain or swelling.  No decreased range of motion.  No back pain.    Physical Exam  BP 136/70 (BP Location: Left Arm, Cuff Size: Normal)   Pulse 98   Ht 5\' 9"  (1.753 m)   Wt 148 lb 12.8 oz (67.5 kg)   SpO2 97% Comment: O2 at 4L  BMI 21.97 kg/m    GEN: A/Ox3; pleasant , NAD, elderly , on O2 ,    HEENT:  [place]/AT,  EACs-clear, TMs-wnl, NOSE-clear, THROAT-clear, no lesions, no postnasal drip or exudate noted.   NECK:  Supple w/ fair ROM; no JVD; normal carotid impulses w/o bruits; no thyromegaly or nodules palpated; no lymphadenopathy.    RESP  Scattered rhonchi , no accessory muscle use, no dullness to percussion  CARD:  RRR, no m/r/g, no peripheral edema, pulses intact, no cyanosis or clubbing.  GI:   Soft & nt; nml bowel sounds; no organomegaly or masses  detected.   Musco: Warm bil, no deformities or joint swelling noted.   Neuro: alert, no focal deficits noted.    Skin: Warm, no lesions or rashes    Lab Results:  CBC    Component Value Date/Time   WBC 13.9 (H) 05/23/2018 1119   RBC 4.67 05/23/2018 1119   HGB 13.2 05/23/2018 1119   HCT 40.2 05/23/2018 1119   PLT 290.0 05/23/2018 1119   MCV 86.2 05/23/2018 1119   MCH 27.7 01/06/2011 1220   MCHC 32.8 05/23/2018 1119  RDW 13.9 05/23/2018 1119   LYMPHSABS 0.6 (L) 05/23/2018 1119   MONOABS 0.7 05/23/2018 1119   EOSABS 0.1 05/23/2018 1119   BASOSABS 0.0 05/23/2018 1119    BMET    Component Value Date/Time   NA 133 (L) 05/23/2018 1119   K 4.2 05/23/2018 1119   CL 94 (L) 05/23/2018 1119   CO2 33 (H) 05/23/2018 1119   GLUCOSE 156 (H) 05/23/2018 1119   BUN 17 05/23/2018 1119   CREATININE 0.56 05/23/2018 1119   CALCIUM 9.4 05/23/2018 1119    BNP No results found for: BNP  ProBNP    Component Value Date/Time   PROBNP 109.0 (H) 05/23/2018 1119    Imaging: Dg Chest 2 View  Result Date: 05/23/2018 CLINICAL DATA:  Follow-up pneumonia, shortness of breath EXAM: CHEST - 2 VIEW COMPARISON:  05/18/2018 FINDINGS: Cardiac shadow is stable. Aortic calcifications are again seen. The lungs are hyperinflated consistent with COPD. Persistent density in the left posterior costophrenic angle is noted stable from the previous exam. No new focal infiltrate is seen. No bony abnormality is noted. Stable compression deformities in the lower thoracic spine are noted. IMPRESSION: Stable density in the left posterior costophrenic angle. Again continued follow-up following appropriate therapy is recommended. Electronically Signed   By: Inez Catalina M.D.   On: 05/23/2018 11:54   Dg Chest 2 View  Result Date: 05/18/2018 CLINICAL DATA:  Shortness of breath, chest congestion, and cough for the past week. Former smoker. History of COPD. EXAM: CHEST - 2 VIEW COMPARISON:  Chest x-ray of January 30, 2018  FINDINGS: The lungs are hyperinflated with hemidiaphragm flattening. Slightly increased lung markings are noted in the left lower lobe and are more conspicuous than in the past. The heart and pulmonary vascularity are normal. The mediastinum is normal in width. There is calcification in the wall of the thoracic aorta. The bony thorax exhibits no acute abnormality. There is chronic partial compression of the body of T11. IMPRESSION: COPD. Subsegmental atelectasis or early pneumonia in the left lower lobe posteriorly. Followup PA and lateral chest X-ray is recommended in 3-4 weeks following trial of antibiotic therapy to ensure resolution and exclude underlying malignancy. Thoracic aortic atherosclerosis. Electronically Signed   By: David  Martinique M.D.   On: 05/18/2018 15:17     Assessment & Plan:   COPD mixed type ([place]) Exacerbation -slow to resolve flare with LLL PNA   Plan  Patient Instructions  Doxycycline 100mg  Twice daily  For 1 week. , take with food.  Mucinex DM twice daily as needed for cough and congestion Prednisone taper as directed then hold at Prednisone 10mg  daily.  Continue on Symbicort and Spiriva Begin DuoNeb Three times a day  .  Continue on  on Oxygen 3 L at rest 4l/m with walking .  Begin Flutter valve Three times a day   Follow-up in 2 week with Dr. Annamaria Boots or Parrett NP  Please contact office for sooner follow up if symptoms do not improve or worsen or seek emergency care        Pneumonia Slow to resolve with ongoing cough /congesiton  CXR is no worse . He is taking in food/fluids  Will continue on OP abx and close monitoring   Plan  Patient Instructions  Doxycycline 100mg  Twice daily  For 1 week. , take with food.  Mucinex DM twice daily as needed for cough and congestion Prednisone taper as directed then hold at Prednisone 10mg  daily.  Continue on Symbicort and Spiriva Begin  DuoNeb Three times a day  .  Continue on  on Oxygen 3 L at rest 4l/m with walking .    Begin Flutter valve Three times a day   Follow-up in 2 week with Dr. Annamaria Boots or Parrett NP  Please contact office for sooner follow up if symptoms do not improve or worsen or seek emergency care        COPD with acute exacerbation Slow to resolve  Add flutter and nebs .  Hold on low dose prednisone until back in office   Plan  Patient Instructions  Doxycycline 100mg  Twice daily  For 1 week. , take with food.  Mucinex DM twice daily as needed for cough and congestion Prednisone taper as directed then hold at Prednisone 10mg  daily.  Continue on Symbicort and Spiriva Begin DuoNeb Three times a day  .  Continue on  on Oxygen 3 L at rest 4l/m with walking .  Begin Flutter valve Three times a day   Follow-up in 2 week with Dr. Annamaria Boots or Parrett NP  Please contact office for sooner follow up if symptoms do not improve or worsen or seek emergency care        Chronic respiratory failure with hypoxia No change in o2 demands  Cont on O2      Tammy Parrett, NP 05/23/2018

## 2018-05-23 NOTE — Progress Notes (Signed)
Patient seen in the office today and instructed on use of flutter valve.  Patient expressed understanding and demonstrated technique. Parke Poisson [facility] 05/23/18

## 2018-05-23 NOTE — Assessment & Plan Note (Signed)
Exacerbation -slow to resolve flare with LLL PNA   Plan  Patient Instructions  Doxycycline 100mg  Twice daily  For 1 week. , take with food.  Mucinex DM twice daily as needed for cough and congestion Prednisone taper as directed then hold at Prednisone 10mg  daily.  Continue on Symbicort and Spiriva Begin DuoNeb Three times a day  .  Continue on  on Oxygen 3 L at rest 4l/m with walking .  Begin Flutter valve Three times a day   Follow-up in 2 week with Dr. Annamaria Boots or Parrett NP  Please contact office for sooner follow up if symptoms do not improve or worsen or seek emergency care

## 2018-05-23 NOTE — Assessment & Plan Note (Addendum)
Slow to resolve  Add flutter and nebs .  Hold on low dose prednisone until back in office   Plan  Patient Instructions  Doxycycline 100mg  Twice daily  For 1 week. , take with food.  Mucinex DM twice daily as needed for cough and congestion Prednisone taper as directed then hold at Prednisone 10mg  daily.  Continue on Symbicort and Spiriva Begin DuoNeb Three times a day  .  Continue on  on Oxygen 3 L at rest 4l/m with walking .  Begin Flutter valve Three times a day   Follow-up in 2 week with Dr. Annamaria Boots or Parrett NP  Please contact office for sooner follow up if symptoms do not improve or worsen or seek emergency care

## 2018-05-23 NOTE — Assessment & Plan Note (Signed)
No change in o2 demands  Cont on O2

## 2018-05-23 NOTE — Patient Instructions (Addendum)
Doxycycline 100mg  Twice daily  For 1 week. , take with food.  Mucinex DM twice daily as needed for cough and congestion Prednisone taper as directed then hold at Prednisone 10mg  daily.  Continue on Symbicort and Spiriva Begin DuoNeb Three times a day  .  Continue on  on Oxygen 3 L at rest 4l/m with walking .  Begin Flutter valve Three times a day   Follow-up in 2 week with Dr. Annamaria Boots or Parrett NP  Please contact office for sooner follow up if symptoms do not improve or worsen or seek emergency care

## 2018-05-23 NOTE — Assessment & Plan Note (Signed)
Slow to resolve with ongoing cough /congesiton  CXR is no worse . He is taking in food/fluids  Will continue on OP abx and close monitoring   Plan  Patient Instructions  Doxycycline 100mg  Twice daily  For 1 week. , take with food.  Mucinex DM twice daily as needed for cough and congestion Prednisone taper as directed then hold at Prednisone 10mg  daily.  Continue on Symbicort and Spiriva Begin DuoNeb Three times a day  .  Continue on  on Oxygen 3 L at rest 4l/m with walking .  Begin Flutter valve Three times a day   Follow-up in 2 week with Dr. Annamaria Boots or Parrett NP  Please contact office for sooner follow up if symptoms do not improve or worsen or seek emergency care

## 2018-05-24 ENCOUNTER — Ambulatory Visit: Payer: Medicare Other | Attending: Family Medicine

## 2018-05-24 ENCOUNTER — Other Ambulatory Visit: Payer: Self-pay

## 2018-05-24 DIAGNOSIS — R252 Cramp and spasm: Secondary | ICD-10-CM

## 2018-05-24 DIAGNOSIS — G8929 Other chronic pain: Secondary | ICD-10-CM | POA: Diagnosis not present

## 2018-05-24 DIAGNOSIS — R293 Abnormal posture: Secondary | ICD-10-CM

## 2018-05-24 DIAGNOSIS — M5442 Lumbago with sciatica, left side: Secondary | ICD-10-CM | POA: Diagnosis not present

## 2018-05-24 DIAGNOSIS — M25652 Stiffness of left hip, not elsewhere classified: Secondary | ICD-10-CM

## 2018-05-24 DIAGNOSIS — M6281 Muscle weakness (generalized): Secondary | ICD-10-CM

## 2018-05-24 NOTE — Therapy (Signed)
[facility] [address] [place], [place], [address] Phone: [phone]   Fax:  [phone]  Physical Therapy Evaluation  Patient Details  Name: [name] MRN: [id number] Date of Birth: 1935-07-21 Referring Provider (PT): Hulan Fess, MD   Encounter Date: 05/24/2018  PT End of Session - 05/24/18 1225    Visit Number  1    Date for PT Re-Evaluation  07/19/18    Authorization Type  Medicare    PT Start Time  1101    PT Stop Time  1155    PT Time Calculation (min)  54 min    Activity Tolerance  Patient tolerated treatment well    Behavior During Therapy  [facility] for tasks assessed/performed       Past Medical History:  Diagnosis Date  . Allergic rhinitis, cause unspecified   . COPD (chronic obstructive pulmonary disease) ([place] [facility])   . Emphysema of lung ([place])   . Pulmonary nodule     Past Surgical History:  Procedure Laterality Date  . APPENDECTOMY    . ROTATOR CUFF REPAIR    . TONSILLECTOMY    . VASECTOMY      There were no vitals filed for this visit.   Subjective Assessment - 05/24/18 1056    Subjective  Pt presents to PT with complaints of Lt posterior thigh pain that began early August after taking a long trip.  Pt reports that every time that he coughs he feels a sharp pain down his left leg.  Pt was evaluated at [facility] and PA wants him to do PT.  Pt tried a dose of Prednisone without relief of symptoms.      Pertinent History  O2 dependent x 15 years, T11 compression fracture    Limitations  Sitting    How long can you sit comfortably?  15 minutes    Diagnostic tests  Dopler to rule out Lt leg clot- negative, x-ray at [facility]: DDD in lumbar spine    Patient Stated Goals  get relief from Lt LE pain    Currently in Pain?  Yes    Pain Score  7    up to 10+/10 with coughing   Pain Location  Leg   from ischial tuberosity to posterior knee   Pain Orientation  Left;Lower    Pain  Descriptors / Indicators  Aching;Burning;Shooting    Pain Type  Chronic pain    Pain Onset  More than a month ago    Pain Frequency  Constant    Aggravating Factors   sitting, coughing    Pain Relieving Factors  supine    Multiple Pain Sites  No         [facility] PT Assessment - 05/24/18 0001      Assessment   Medical Diagnosis  lumbar radiculopathy, Lt leg pain    Referring Provider (PT)  Hulan Fess, MD    Onset Date/Surgical Date  03/16/18    Next MD Visit  November 2019      Precautions   Precautions  Other (comment)   )2 dependent     Restrictions   Weight Bearing Restrictions  No      Balance Screen   Has the patient fallen in the past 6 months  Yes    How many times?  1   lifting heavy box from floor to counter and lost balance   Has the patient had a decrease in activity level because of a fear  of falling?   No    Is the patient reluctant to leave their home because of a fear of falling?   No      Home Environment   Living Environment  Private residence    Living Arrangements  Spouse/significant other    Type of [place] to enter    Entrance Stairs-Number of Steps  1    [place] to live on main level with bedroom/bathroom    Home Equipment  Transport chair      Prior Function   Level of [place]  Retired    Leisure  none      Cognition   Overall Cognitive Status  Within Functional Limits for tasks assessed      Observation/Other Assessments   Focus on Therapeutic Outcomes (FOTO)   51% limitation      Posture/Postural Control   Posture/Postural Control  Postural limitations    Postural Limitations  Forward head;Rounded Shoulders;Decreased lumbar lordosis;Posterior pelvic tilt;Flexed trunk      ROM / Strength   AROM / PROM / Strength  AROM;PROM;Strength      AROM   Overall AROM   Deficits    Overall AROM Comments  Lumbar A/ROM limited by 25% in all directions.  No change in LE symptoms  with lumbar A/ROM.  Repeated extension-no change in symptoms, repeated flexion- no change in symptoms      PROM   Overall PROM   Deficits    Overall PROM Comments  hip flexibility limited by 25-50% thoughout without pain.  Lt hamstring flexibility limited by 25% vs Rt with hamstring pain reported.  quad lag on Lt due to shortened hamstring      Strength   Overall Strength  Deficits    Overall Strength Comments  bil hips 4/5, knees 4/5, ankle DF 4+/5      Palpation   Spinal mobility  difficult to assess as pt not able to lie in prone    Palpation comment  marked palpable tenderness over proximal hamstrings and moderate tenderness over distal hamstrings on the Lt.  No palpable tenderness in Lumbar spine      Special Tests    Special Tests  Lumbar    Lumbar Tests  Straight Leg Raise;Slump Test      Slump test   Findings  Positive    Side  Left    Comment  hamstring pain- not determined if radiculopathy      Straight Leg Raise   Findings  Negative    Side   Left      Ambulation/Gait   Ambulation/Gait  Yes    Gait Pattern  Within Functional Limits                Objective measurements completed on examination: See above findings.      [facility] Adult PT Treatment/Exercise - 05/24/18 0001      Modalities   Modalities  Electrical Stimulation;Moist Heat      Moist Heat Therapy   Number Minutes Moist Heat  15 Minutes    Moist Heat Location  Hip      Electrical Stimulation   Electrical Stimulation Location  Lt hamstring and gluteals    Electrical Stimulation Action  IFC    Electrical Stimulation Parameters  15 minutes    Electrical Stimulation Goals  Pain             PT  Education - 05/24/18 1139    Education Details   Access Code: [email or web address], home TENs info    Person(s) Educated  Patient    Methods  Explanation;Demonstration;Handout    Comprehension  Verbalized understanding;Returned demonstration       PT Short Term Goals - 05/24/18 1104      PT SHORT  TERM GOAL #1   Time  4    Period  Weeks    Status  New    Target Date  06/21/18      PT SHORT TERM GOAL #2   Title  understand correct body mechanics with daily activities and lifting and demonstrate correctly    Time  4    Period  Weeks    Status  New    Target Date  06/21/18      PT SHORT TERM GOAL #3   Title  report a 30% reduction in Lt LE pain with sitting     Time  4    Period  Weeks    Status  New    Target Date  06/21/18      PT SHORT TERM GOAL #4   Title  --        PT Long Term Goals - 05/24/18 1217      PT LONG TERM GOAL #1   Title  be independent in advanced HEP    Time  8    Period  Weeks    Status  On-going    Target Date  07/19/18      PT LONG TERM GOAL #2   Title  reduce FOTO to < or = to 37% limitation    Time  8    Period  Weeks    Status  New    Target Date  07/19/18      PT LONG TERM GOAL #3   Title  report < or = to 2/10 Lt LE pain with coughing    Time  8    Period  Weeks    Status  New    Target Date  07/19/18      PT LONG TERM GOAL #4   Title  report a 60% reduction in the frequency and intensity of Lt LE pain with sitting     Time  8    Period  Weeks    Status  New    Target Date  07/19/18      PT LONG TERM GOAL #5   Title  improve Lt hamstring length to allow for sitting with neutral posture and perform Long arc quad without lag    Time  8    Period  Weeks    Target Date  07/19/18             Plan - 05/24/18 1139    Clinical Impression Statement  Pt presents to PT with complaints of Lt LE pain that began 03/2018 after a long car ride.  Pt reports 7/10 pain at rest and up to 10/10 when he coughs.  He has had a recent bout of pneumonia so has been coughing a lot.  Pt had x-ray at [facility] yesterday and has lumbar DDD.  Pt demonstrates poor posture with forward head, rounded shoulders and posterior pelvic tilt.  Pt with significant hip stiffness and Lt hamstring flexibility is limited by 25% vs the Rt and pt demonstrates  quad lag due to this.  Pt with palpable tenderness over proximal Lt hamstring at insertion and proximal 1/3  of muscles with trigger points.  No other palpable tenderness reported today.  Pt will benefit from skilled PT for manual to Lt hamstrings, flexibility for bil hips, core strength, hip strength and modalities for pain management.      History and Personal Factors relevant to plan of care:  COPD with 02 dependency, T11 fracture, pneumonia (active)    Clinical Presentation  Evolving    Clinical Presentation due to:  worsening Lt LE radiculopathy up to 10/10, 02 dependent     Rehab Potential  Excellent    PT Frequency  2x / week    PT Duration  8 weeks    PT Treatment/Interventions  ADLs/Self Care Home Management;Cryotherapy;Electrical Stimulation;Ultrasound;Moist Heat;Traction;Functional mobility training;Therapeutic activities;Therapeutic exercise;Patient/family education;Neuromuscular re-education;Manual techniques;Passive range of motion;Dry needling;Taping    PT Next Visit Plan  Continue electrical stimulation if helpful, dry needling to Lt hamstring and manual therapy, hip flexibility    PT Home Exercise Plan   Access Code: [email or web address]     Consulted and Agree with Plan of Care  Patient       Patient will benefit from skilled therapeutic intervention in order to improve the following deficits and impairments:  Impaired flexibility, Decreased activity tolerance, Decreased endurance, Decreased range of motion, Decreased strength, Postural dysfunction, Increased muscle spasms, Improper body mechanics, Pain  Visit Diagnosis: Chronic left-sided low back pain with left-sided sciatica - Plan: PT plan of care cert/re-cert  Abnormal posture - Plan: PT plan of care cert/re-cert  Cramp and spasm - Plan: PT plan of care cert/re-cert  Muscle weakness (generalized) - Plan: PT plan of care cert/re-cert  Stiffness of left hip, not elsewhere classified - Plan: PT plan of care  cert/re-cert     Problem List Patient Active Problem List   Diagnosis Date Noted  . Left leg pain 05/18/2018  . Pneumonia 05/18/2018  . Chronic respiratory failure with hypoxia ([place]) 01/12/2015  . COPD with acute exacerbation ([place]) 01/21/2013  . Hyperlipidemia 12/01/2011  . Hypertension 12/01/2011  . CAD (coronary artery disease) 05/23/2011  . Lung nodule 09/16/2008  . ALLERGIC RHINITIS 07/24/2007  . COPD mixed type ([place]) 07/24/2007  . BURSITIS 07/24/2007   Sigurd Sos, PT 05/24/18 12:28 PM  [facility] [address], [place], [place], [address] Phone: [phone]   Fax:  [phone]  Name: TOBIAH CELESTINE MRN: [id number] Date of Birth: 08-07-1935

## 2018-05-24 NOTE — Patient Instructions (Signed)
Access Code: [email or web address]  URL: [email or web address]  Date: 05/24/2018  Prepared by: Sigurd Sos   Exercises  Seated Hamstring Stretch - 3 reps - 20 hold - 4x daily - 7x weekly  Hooklying Single Knee to Chest - 3 reps - 1 sets - 20 hold - 3x daily - 7x weekly

## 2018-05-25 ENCOUNTER — Encounter (HOSPITAL_COMMUNITY): Payer: Self-pay

## 2018-05-26 ENCOUNTER — Telehealth: Payer: Self-pay | Admitting: Internal Medicine

## 2018-05-26 DIAGNOSIS — D649 Anemia, unspecified: Secondary | ICD-10-CM | POA: Diagnosis not present

## 2018-05-26 MED ORDER — PROMETHAZINE-CODEINE 6.25-10 MG/5ML PO SYRP: 5.0000 mL | ORAL_SOLUTION | Freq: Four times a day (QID) | ORAL | 0 refills | Status: DC | PRN

## 2018-05-26 NOTE — Telephone Encounter (Signed)
I think we need to give doxycycline antibiotic longer to work.  Ok to offer cough syrup- prometh codeine 200 ml,  5 ml every 4-6 hours as needed for cough

## 2018-05-26 NOTE — Telephone Encounter (Signed)
Called and spoke with patient. He states he was started on a new ABX Tuesday 05/23/2018 when he saw Tammy Parrett. Patient states this is his 2nd round of ABX.  He still is having a horrible cough that is keeping him up at night. When he is able to cough something up it is yellow/green sputum.  He is starting to get muscle pain in ribs due to the coughing spells.   Patient wants to know if he needs a different ABX and if there is anything to calm down his cough so he can sleep. Offered patient to come in but he wanted to ask a MD before he came in to see if he needed to come in.  CY Please Advise  Allergies  Allergen Reactions  . Tolectin [Tolmetin Sodium] Other (See Comments)    BP low and passed out  . Augmentin [Amoxicillin-Pot Clavulanate] Nausea And Vomiting  . Montelukast Sodium    Current Outpatient Medications on File Prior to Visit  Medication Sig Dispense Refill  . albuterol (PROAIR HFA) 108 (90 BASE) MCG/ACT inhaler Inhale 2 puffs into the lungs 4 (four) times daily as needed for wheezing or shortness of breath. 1 Inhaler 5  . aspirin EC 81 MG tablet Take 1 tablet (81 mg total) by mouth daily.    [name] azithromycin (ZITHROMAX Z-PAK) 250 MG tablet Take 1 tablet (250 mg total) by mouth daily. 2 each 0  . azithromycin (ZITHROMAX) 250 MG tablet Take 1 tablet (250 mg total) by mouth as directed. 6 tablet 0  . digoxin (LANOXIN) 0.25 MG tablet Take 250 mcg by mouth daily.      [name] doxycycline (VIBRA-TABS) 100 MG tablet Take 1 tablet (100 mg total) by mouth 2 (two) times daily. 14 tablet 0  . ipratropium-albuterol (DUONEB) 0.5-2.5 (3) MG/3ML SOLN Take 3 mLs by nebulization every 6 (six) hours as needed. 75 mL 12  . losartan (COZAAR) 25 MG tablet Take 1 tablet (25 mg total) daily by mouth. 90 tablet 3  . OXYGEN Inhale into the lungs.    . pravastatin (PRAVACHOL) 40 MG tablet Take 1 tablet by mouth daily.    . predniSONE (DELTASONE) 10 MG tablet 4 X 2 DAYS, 3 X 2 DAYS, 2 X 2 DAYS, 1 X 2 DAYS 20  tablet 0  . predniSONE (DELTASONE) 10 MG tablet 4 tabs for 3 days, then 3 tabs for 3 days, 2 tabs for 3 days, then 1 tab for 3 days, then stop 30 tablet 0  . predniSONE (DELTASONE) 10 MG tablet Take 1 tablet (10 mg total) by mouth daily with breakfast. 30 tablet 1  . Respiratory Therapy Supplies (FLUTTER) DEVI Use as directed 1 each 0  . SPIRIVA RESPIMAT 2.5 MCG/ACT AERS INHALE 2 PUFFS BY MOUTH EVERY DAY 4 g 11  . SYMBICORT 160-4.5 MCG/ACT inhaler INHALE 2 PUFFS BY MOUTH TWICE DAILY. RINSE MOUTH 10.2 g 5  . tamsulosin (FLOMAX) 0.4 MG CAPS capsule Take 1 capsule by mouth daily.  2   No current facility-administered medications on file prior to visit.

## 2018-05-26 NOTE — Telephone Encounter (Signed)
Called and spoke with patient. He said he would try to cough syrup.   RX called into [facility] on [address] per patients request.  Instructed patient if did not feel better to call our office to head to nearest ER.  Nothing further at this time.

## 2018-05-30 ENCOUNTER — Encounter (HOSPITAL_COMMUNITY): Payer: Self-pay

## 2018-05-30 ENCOUNTER — Encounter: Payer: Self-pay | Admitting: Adult Health

## 2018-05-30 ENCOUNTER — Ambulatory Visit (INDEPENDENT_AMBULATORY_CARE_PROVIDER_SITE_OTHER): Payer: Medicare Other | Admitting: Adult Health

## 2018-05-30 ENCOUNTER — Other Ambulatory Visit: Payer: Medicare Other

## 2018-05-30 DIAGNOSIS — J9611 Chronic respiratory failure with hypoxia: Secondary | ICD-10-CM

## 2018-05-30 DIAGNOSIS — J189 Pneumonia, unspecified organism: Secondary | ICD-10-CM

## 2018-05-30 DIAGNOSIS — J441 Chronic obstructive pulmonary disease with (acute) exacerbation: Secondary | ICD-10-CM | POA: Diagnosis not present

## 2018-05-30 DIAGNOSIS — J181 Lobar pneumonia, unspecified organism: Secondary | ICD-10-CM

## 2018-05-30 MED ORDER — LEVALBUTEROL HCL 0.63 MG/3ML IN NEBU
0.6300 mg | INHALATION_SOLUTION | Freq: Once | RESPIRATORY_TRACT | Status: AC
  Administered 2018-05-30: 0.63 mg via RESPIRATORY_TRACT

## 2018-05-30 NOTE — Patient Instructions (Signed)
Set up CT chest .  Sputum culture .  Finish Doxycycline as discussed .  Mucinex DM twice daily as needed for cough and congestion Remain on Prednisone 10mg  daily.  Continue on Symbicort and Spiriva Continue on DuoNeb Three times a day  .  Continue on  on Oxygen 3 L at rest 4l/m with walking .  Continue on Flutter valve Three times a day   Follow-up in 2 week with Dr. Annamaria Boots or Patt Steinhardt NP  Please contact office for sooner follow up if symptoms do not improve or worsen or seek emergency care

## 2018-05-30 NOTE — Assessment & Plan Note (Signed)
Left lower lobe pneumonia-community-acquired Suspect patient is having slow lag time with clearance on chest x-ray.  He is to finish his antibiotics.  Sputum culture is pending.  Check CT chest  Plan  Patient Instructions  Set up CT chest .  Sputum culture .  Finish Doxycycline as discussed .  Mucinex DM twice daily as needed for cough and congestion Remain on Prednisone 10mg  daily.  Continue on Symbicort and Spiriva Continue on DuoNeb Three times a day  .  Continue on  on Oxygen 3 L at rest 4l/m with walking .  Continue on Flutter valve Three times a day   Follow-up in 2 week with Dr. Annamaria Boots or Kierrah Kilbride NP  Please contact office for sooner follow up if symptoms do not improve or worsen or seek emergency care

## 2018-05-30 NOTE — Assessment & Plan Note (Signed)
Slow to resolve exacerbation with LLL PNA  Clinically he is stable, cXR last week with no progression. Labs were c/w PNA , no anemia . BNP minimally elevated and good renal fxn .  He has had 2 appropriate abx that should have covered his PNA .  Will check sputum cx for atypical germ/resistance .  Check CT chest to evaluate PNA, also weight loss with previous smoking hx.  Cont w/ aggressive pulmonary regimen -Symbicort, Spiriva, flutter, DuoNeb and low-dose prednisone Discussed  case with Dr. Annamaria Boots, Dr. Annamaria Boots and with patient  Plan  Patient Instructions  Set up CT chest .  Sputum culture .  Finish Doxycycline as discussed .  Mucinex DM twice daily as needed for cough and congestion Remain on Prednisone 10mg  daily.  Continue on Symbicort and Spiriva Continue on DuoNeb Three times a day  .  Continue on  on Oxygen 3 L at rest 4l/m with walking .  Continue on Flutter valve Three times a day   Follow-up in 2 week with Dr. Annamaria Boots or Parrett NP  Please contact office for sooner follow up if symptoms do not improve or worsen or seek emergency care

## 2018-05-30 NOTE — Progress Notes (Signed)
@Patient  ID: [name], male    DOB: Jan 01, 1935, 83 y.o.   MRN: [id number]  Chief Complaint  Patient presents with  . Follow-up    COPD     Referring provider: Hulan Fess, MD  HPI: 83 year old male former smoker followed for COPD/emphysema, hypoxic respiratory failure on oxygen Medical history significant for coronary artery disease  TEST PFT 2012>FEV1 23%, ratio 27, FVC 63%, DLCO 37% a1AT 05/04/12- MM Echo 2015 EF 50 to 55%, RV moderately dilated CT chest July 2017>resolution of pulmonary nodule left upper lobe.Remaining nodules stable as 2013  05/30/2018 Follow up : PNA/COPD  Patient returns for a one-week follow-up.  Patient was initially seen May 18, 2018 for increased cough and congestion found to have a left lower lobe pneumonia.  He had already started a Z-Pak at home.  His course was extended for a total of a 7-day course.  And a prednisone taper.  It returned on October 8 with only minimum improvement in symptoms. Last visit patient was started on doxycycline for 1 week.  And prednisone taper was extended with a slow taper down to 10 mg daily.  He was started on DuoNeb nebulizer 3 times daily.  And flutter valve was added. Complains of not feeling good , does not feel he is improving . Still feel tired, has congested cough with thick yellow mucus , sometimes green especially at night .  Appetite is okay but weight is trending down slowly -down 16lbs over last year. No n/vd.   He remains on Symbicort and Spiriva along with oxygen 3 L at rest and 4 L with activity.  Patient has not seen any changes oxygen demands.  O2 saturations 100% on arrival today  Patient was having increased leg swelling.  He was set up for a venous Doppler was negative for DVT.  He has been followed by orthopedics for back and leg pain.. Leg swelling is much improved.      Allergies  Allergen Reactions  . Tolectin [Tolmetin Sodium] Other (See Comments)    BP low and passed out    . Augmentin [Amoxicillin-Pot Clavulanate] Nausea And Vomiting  . Montelukast Sodium     Immunization History  Administered Date(s) Administered  . Influenza Split 04/16/2011, 05/04/2012, 04/16/2013, 06/13/2014, 04/20/2017  . Influenza, High Dose Seasonal PF 04/16/2018  . Influenza,inj,Quad PF,6+ Mos 03/31/2015, 04/15/2016  . Pneumococcal Conjugate-13 06/05/2013  . Pneumococcal Polysaccharide-23 01/23/1993, 04/17/1999  . Tdap 08/25/2010  . Zoster 07/14/2005    Past Medical History:  Diagnosis Date  . Allergic rhinitis, cause unspecified   . COPD (chronic obstructive pulmonary disease) ([place])   . Emphysema of lung ([place])   . Pulmonary nodule     Tobacco History: Social History   Tobacco Use  Smoking Status Former Smoker  . Last attempt to quit: 08/16/1990  . Years since quitting: 27.8  Smokeless Tobacco Never Used   Counseling given: Not Answered   Outpatient Medications Prior to Visit  Medication Sig Dispense Refill  . albuterol (PROAIR HFA) 108 (90 BASE) MCG/ACT inhaler Inhale 2 puffs into the lungs 4 (four) times daily as needed for wheezing or shortness of breath. 1 Inhaler 5  . aspirin EC 81 MG tablet Take 1 tablet (81 mg total) by mouth daily.    . digoxin (LANOXIN) 0.25 MG tablet Take 250 mcg by mouth daily.      [name] doxycycline (VIBRA-TABS) 100 MG tablet Take 1 tablet (100 mg total) by mouth 2 (two) times daily. [place]  tablet 0  . ipratropium-albuterol (DUONEB) 0.5-2.5 (3) MG/3ML SOLN Take 3 mLs by nebulization every 6 (six) hours as needed. 75 mL 12  . losartan (COZAAR) 25 MG tablet Take 1 tablet (25 mg total) daily by mouth. 90 tablet 3  . OXYGEN Inhale into the lungs.    . pravastatin (PRAVACHOL) 40 MG tablet Take 1 tablet by mouth daily.    . predniSONE (DELTASONE) 10 MG tablet 4 tabs for 3 days, then 3 tabs for 3 days, 2 tabs for 3 days, then 1 tab for 3 days, then stop 30 tablet 0  . predniSONE (DELTASONE) 10 MG tablet Take 1 tablet (10 mg total) by mouth daily with  breakfast. 30 tablet 1  . promethazine-codeine (PHENERGAN WITH CODEINE) 6.25-10 MG/5ML syrup Take 5 mLs by mouth every 6 (six) hours as needed for cough. 200 mL 0  . Respiratory Therapy Supplies (FLUTTER) DEVI Use as directed 1 each 0  . SPIRIVA RESPIMAT 2.5 MCG/ACT AERS INHALE 2 PUFFS BY MOUTH EVERY DAY 4 g 11  . SYMBICORT 160-4.5 MCG/ACT inhaler INHALE 2 PUFFS BY MOUTH TWICE DAILY. RINSE MOUTH 10.2 g 5  . tamsulosin (FLOMAX) 0.4 MG CAPS capsule Take 1 capsule by mouth daily.  2  . azithromycin (ZITHROMAX Z-PAK) 250 MG tablet Take 1 tablet (250 mg total) by mouth daily. 2 each 0  . azithromycin (ZITHROMAX) 250 MG tablet Take 1 tablet (250 mg total) by mouth as directed. 6 tablet 0  . predniSONE (DELTASONE) 10 MG tablet 4 X 2 DAYS, 3 X 2 DAYS, 2 X 2 DAYS, 1 X 2 DAYS (Patient not taking: Reported on 05/30/2018) 20 tablet 0   No facility-administered medications prior to visit.      Review of Systems  Constitutional:   No  weight loss, night sweats,  Fevers, chills,  +fatigue, or  lassitude.  HEENT:   No headaches,  Difficulty swallowing,  Tooth/dental problems, or  Sore throat,                No sneezing, itching, ear ache,  _+nasal congestion, post nasal drip,   CV:  No chest pain,  Orthopnea, PND, swelling in lower extremities, anasarca, dizziness, palpitations, syncope.   GI  No heartburn, indigestion, abdominal pain, nausea, vomiting, diarrhea, change in bowel habits, loss of appetite, bloody stools.   Resp:   No chest wall deformity  Skin: no rash or lesions.  GU: no dysuria, change in color of urine, no urgency or frequency.  No flank pain, no hematuria   MS:  No joint pain or swelling.  No decreased range of motion.  No back pain.    Physical Exam  BP 122/64 (BP Location: Left Arm, Cuff Size: Normal)   Pulse 96   Temp 98.7 F (37.1 C) (Oral)   Ht 5\' 9"  (1.753 m)   Wt 146 lb (66.2 kg)   SpO2 100%   BMI 21.56 kg/m   GEN: A/Ox3; pleasant , NAD, elderly frail on  oxygen   HEENT:  [place]/AT,  EACs-clear, TMs-wnl, NOSE-clear, THROAT-clear, no lesions, no postnasal drip or exudate noted.   NECK:  Supple w/ fair ROM; no JVD; normal carotid impulses w/o bruits; no thyromegaly or nodules palpated; no lymphadenopathy.    RESP scattered rhonchi  no accessory muscle use, no dullness to percussion  CARD:  RRR, no m/r/g, trace to none  peripheral edema, pulses intact, no cyanosis or clubbing.  GI:   Soft & nt; nml bowel sounds; no organomegaly or  masses detected.   Musco: Warm bil, no deformities or joint swelling noted.   Neuro: alert, no focal deficits noted.    Skin: Warm, no lesions or rashes    Lab Results:  CBC    Component Value Date/Time   WBC 13.9 (H) 05/23/2018 1119   RBC 4.67 05/23/2018 1119   HGB 13.2 05/23/2018 1119   HCT 40.2 05/23/2018 1119   PLT 290.0 05/23/2018 1119   MCV 86.2 05/23/2018 1119   MCH 27.7 01/06/2011 1220   MCHC 32.8 05/23/2018 1119   RDW 13.9 05/23/2018 1119   LYMPHSABS 0.6 (L) 05/23/2018 1119   MONOABS 0.7 05/23/2018 1119   EOSABS 0.1 05/23/2018 1119   BASOSABS 0.0 05/23/2018 1119    BMET    Component Value Date/Time   NA 133 (L) 05/23/2018 1119   K 4.2 05/23/2018 1119   CL 94 (L) 05/23/2018 1119   CO2 33 (H) 05/23/2018 1119   GLUCOSE 156 (H) 05/23/2018 1119   BUN 17 05/23/2018 1119   CREATININE 0.56 05/23/2018 1119   CALCIUM 9.4 05/23/2018 1119    BNP No results found for: BNP  ProBNP    Component Value Date/Time   PROBNP 109.0 (H) 05/23/2018 1119    Imaging: Dg Chest 2 View  Result Date: 05/23/2018 CLINICAL DATA:  Follow-up pneumonia, shortness of breath EXAM: CHEST - 2 VIEW COMPARISON:  05/18/2018 FINDINGS: Cardiac shadow is stable. Aortic calcifications are again seen. The lungs are hyperinflated consistent with COPD. Persistent density in the left posterior costophrenic angle is noted stable from the previous exam. No new focal infiltrate is seen. No bony abnormality is noted. Stable  compression deformities in the lower thoracic spine are noted. IMPRESSION: Stable density in the left posterior costophrenic angle. Again continued follow-up following appropriate therapy is recommended. Electronically Signed   By: Inez Catalina M.D.   On: 05/23/2018 11:54   Dg Chest 2 View  Result Date: 05/18/2018 CLINICAL DATA:  Shortness of breath, chest congestion, and cough for the past week. Former smoker. History of COPD. EXAM: CHEST - 2 VIEW COMPARISON:  Chest x-ray of January 30, 2018 FINDINGS: The lungs are hyperinflated with hemidiaphragm flattening. Slightly increased lung markings are noted in the left lower lobe and are more conspicuous than in the past. The heart and pulmonary vascularity are normal. The mediastinum is normal in width. There is calcification in the wall of the thoracic aorta. The bony thorax exhibits no acute abnormality. There is chronic partial compression of the body of T11. IMPRESSION: COPD. Subsegmental atelectasis or early pneumonia in the left lower lobe posteriorly. Followup PA and lateral chest X-ray is recommended in 3-4 weeks following trial of antibiotic therapy to ensure resolution and exclude underlying malignancy. Thoracic aortic atherosclerosis. Electronically Signed   By: David  Martinique M.D.   On: 05/18/2018 15:17     Assessment & Plan:   COPD with acute exacerbation Slow to resolve exacerbation with LLL PNA  Clinically he is stable, cXR last week with no progression. Labs were c/w PNA , no anemia . BNP minimally elevated and good renal fxn .  He has had 2 appropriate abx that should have covered his PNA .  Will check sputum cx for atypical germ/resistance .  Check CT chest to evaluate PNA, also weight loss with previous smoking hx.  Cont w/ aggressive pulmonary regimen -Symbicort, Spiriva, flutter, DuoNeb and low-dose prednisone Discussed  case with Dr. Annamaria Boots, Dr. Annamaria Boots and with patient  Plan  Patient Instructions  Set up  CT chest .  Sputum culture .   Finish Doxycycline as discussed .  Mucinex DM twice daily as needed for cough and congestion Remain on Prednisone 10mg  daily.  Continue on Symbicort and Spiriva Continue on DuoNeb Three times a day  .  Continue on  on Oxygen 3 L at rest 4l/m with walking .  Continue on Flutter valve Three times a day   Follow-up in 2 week with Dr. Annamaria Boots or Bassheva Flury NP  Please contact office for sooner follow up if symptoms do not improve or worsen or seek emergency care        Chronic respiratory failure with hypoxia Adequate on oxygen no increased O2 demands.  Continue on current regimen  Pneumonia Left lower lobe pneumonia-community-acquired Suspect patient is having slow lag time with clearance on chest x-ray.  He is to finish his antibiotics.  Sputum culture is pending.  Check CT chest  Plan  Patient Instructions  Set up CT chest .  Sputum culture .  Finish Doxycycline as discussed .  Mucinex DM twice daily as needed for cough and congestion Remain on Prednisone 10mg  daily.  Continue on Symbicort and Spiriva Continue on DuoNeb Three times a day  .  Continue on  on Oxygen 3 L at rest 4l/m with walking .  Continue on Flutter valve Three times a day   Follow-up in 2 week with Dr. Annamaria Boots or Ticara Waner NP  Please contact office for sooner follow up if symptoms do not improve or worsen or seek emergency care           Rexene Edison, NP 05/30/2018

## 2018-05-30 NOTE — Assessment & Plan Note (Signed)
Adequate on oxygen no increased O2 demands.  Continue on current regimen

## 2018-05-30 NOTE — Addendum Note (Signed)
Addended by: Parke Poisson E on: 05/30/2018 05:13 PM   Modules accepted: Orders

## 2018-06-01 ENCOUNTER — Ambulatory Visit
Admission: RE | Admit: 2018-06-01 | Discharge: 2018-06-01 | Disposition: A | Discharge status: Home / Self Care | Payer: Medicare Other | Source: Ambulatory Visit | Attending: Adult Health | Admitting: Adult Health

## 2018-06-01 ENCOUNTER — Ambulatory Visit: Payer: Medicare Other | Admitting: Physical Therapy

## 2018-06-01 ENCOUNTER — Encounter (HOSPITAL_COMMUNITY): Payer: Self-pay

## 2018-06-01 DIAGNOSIS — M5442 Lumbago with sciatica, left side: Principal | ICD-10-CM

## 2018-06-01 DIAGNOSIS — G8929 Other chronic pain: Secondary | ICD-10-CM | POA: Diagnosis not present

## 2018-06-01 DIAGNOSIS — R252 Cramp and spasm: Secondary | ICD-10-CM

## 2018-06-01 DIAGNOSIS — M25652 Stiffness of left hip, not elsewhere classified: Secondary | ICD-10-CM

## 2018-06-01 DIAGNOSIS — M6281 Muscle weakness (generalized): Secondary | ICD-10-CM | POA: Diagnosis not present

## 2018-06-01 DIAGNOSIS — J181 Lobar pneumonia, unspecified organism: Secondary | ICD-10-CM | POA: Diagnosis not present

## 2018-06-01 DIAGNOSIS — R293 Abnormal posture: Secondary | ICD-10-CM | POA: Diagnosis not present

## 2018-06-01 DIAGNOSIS — J441 Chronic obstructive pulmonary disease with (acute) exacerbation: Secondary | ICD-10-CM

## 2018-06-01 MED ORDER — IOPAMIDOL (ISOVUE-300) INJECTION 61%
75.0000 mL | Freq: Once | INTRAVENOUS | Status: AC | PRN
  Administered 2018-06-01: 75 mL via INTRAVENOUS

## 2018-06-01 NOTE — Therapy (Signed)
[facility] [address], [place], [place], [address] Phone: [phone]   Fax:  [phone]  Physical Therapy Treatment  Patient Details  Name: [name] MRN: [id number] Date of Birth: September 08, 1934 Referring Provider (PT): Hulan Fess, MD   Encounter Date: 06/01/2018  PT End of Session - 06/01/18 1033    Visit Number  2    Date for PT Re-Evaluation  07/19/18    Authorization Type  Medicare    PT Start Time  7902    PT Stop Time  1041   delay due to room availability and water break   PT Time Calculation (min)  66 min    Activity Tolerance  Patient tolerated treatment well    Behavior During Therapy  [facility] for tasks assessed/performed       Past Medical History:  Diagnosis Date  . Allergic rhinitis, cause unspecified   . COPD (chronic obstructive pulmonary disease) ([place])   . Emphysema of lung ([place])   . Pulmonary nodule     Past Surgical History:  Procedure Laterality Date  . APPENDECTOMY    . ROTATOR CUFF REPAIR    . TONSILLECTOMY    . VASECTOMY      There were no vitals filed for this visit.  Subjective Assessment - 06/01/18 0932    Subjective  Pt presents with TENs unit today. He had a good nights sleep last night.    Patient Stated Goals  get relief from Lt LE pain    Currently in Pain?  Yes    Pain Score  4     Pain Orientation  Left    Pain Descriptors / Indicators  Aching    Pain Type  Chronic pain    Pain Onset  More than a month ago    Pain Frequency  Constant                       [facility] Adult PT Treatment/Exercise - 06/01/18 0001      Self-Care   Self-Care  Posture;Other Self-Care Comments    Posture  seated posture at computer with use of towel roll; avoiding soft furntiture    Other Self-Care Comments   TENS unit education/precautions and contracindications      Exercises   Exercises  Knee/Hip      Knee/Hip Exercises: Stretches   Active Hamstring Stretch   Left;3 reps;30 seconds    Quad Stretch  Left;2 reps;60 seconds   in prone     Knee/Hip Exercises: Aerobic   Nustep  L1 x 1.5 min   stopped due to pulmonary endurance     Knee/Hip Exercises: Standing   Other Standing Knee Exercises  standing extension x 10 after Nustep      Modalities   Modalities  Electrical Stimulation;Moist Heat      Moist Heat Therapy   Number Minutes Moist Heat  15 Minutes    Moist Heat Location  Other (comment)   left HS     Electrical Stimulation   Electrical Stimulation Location  Lt hamstrings    Electrical Stimulation Action  premod    Electrical Stimulation Parameters  15    Electrical Stimulation Goals  Pain      Manual Therapy   Manual Therapy  Soft tissue mobilization;Myofascial release    Soft tissue mobilization  to left HS    Myofascial Release  to medial left HS and L piriformis  PT Education - 06/01/18 1032    Education Details  TENs unit and postural ed; DN info    Person(s) Educated  Patient    Methods  Explanation;Demonstration;Verbal cues;Handout    Comprehension  Verbalized understanding;Returned demonstration       PT Short Term Goals - 05/24/18 1104      PT SHORT TERM GOAL #1   Time  4    Period  Weeks    Status  New    Target Date  06/21/18      PT SHORT TERM GOAL #2   Title  understand correct body mechanics with daily activities and lifting and demonstrate correctly    Time  4    Period  Weeks    Status  New    Target Date  06/21/18      PT SHORT TERM GOAL #3   Title  report a 30% reduction in Lt LE pain with sitting     Time  4    Period  Weeks    Status  New    Target Date  06/21/18      PT SHORT TERM GOAL #4   Title  --        PT Long Term Goals - 05/24/18 1217      PT LONG TERM GOAL #1   Title  be independent in advanced HEP    Time  8    Period  Weeks    Status  On-going    Target Date  07/19/18      PT LONG TERM GOAL #2   Title  reduce FOTO to < or = to 37% limitation     Time  8    Period  Weeks    Status  New    Target Date  07/19/18      PT LONG TERM GOAL #3   Title  report < or = to 2/10 Lt LE pain with coughing    Time  8    Period  Weeks    Status  New    Target Date  07/19/18      PT LONG TERM GOAL #4   Title  report a 60% reduction in the frequency and intensity of Lt LE pain with sitting     Time  8    Period  Weeks    Status  New    Target Date  07/19/18      PT LONG TERM GOAL #5   Title  improve Lt hamstring length to allow for sitting with neutral posture and perform Long arc quad without lag    Time  8    Period  Weeks    Target Date  07/19/18            Plan - 06/01/18 1035    Clinical Impression Statement  Patient tolerated manual therapy to left HS and piriformis well. He was educated in correct sitting posture, DN and in application of TENS unit. No goals met as only second visit.    PT Treatment/Interventions  ADLs/Self Care Home Management;Cryotherapy;Electrical Stimulation;Ultrasound;Moist Heat;Traction;Functional mobility training;Therapeutic activities;Therapeutic exercise;Patient/family education;Neuromuscular re-education;Manual techniques;Passive range of motion;Dry needling;Taping    PT Next Visit Plan  Continue electrical stimulation if helpful, dry needling to Lt hamstring and manual therapy, hip flexibility       Patient will benefit from skilled therapeutic intervention in order to improve the following deficits and impairments:  Impaired flexibility, Decreased activity tolerance, Decreased endurance, Decreased range of motion, Decreased  strength, Postural dysfunction, Increased muscle spasms, Improper body mechanics, Pain  Visit Diagnosis: Chronic left-sided low back pain with left-sided sciatica  Abnormal posture  Cramp and spasm  Muscle weakness (generalized)  Stiffness of left hip, not elsewhere classified     Problem List Patient Active Problem List   Diagnosis Date Noted  . Left leg pain  05/18/2018  . Pneumonia 05/18/2018  . Chronic respiratory failure with hypoxia ([place]) 01/12/2015  . COPD with acute exacerbation ([place]) 01/21/2013  . Hyperlipidemia 12/01/2011  . Hypertension 12/01/2011  . CAD (coronary artery disease) 05/23/2011  . Lung nodule 09/16/2008  . ALLERGIC RHINITIS 07/24/2007  . COPD mixed type ([place]) 07/24/2007  . BURSITIS 07/24/2007    Afsana Liera PT 06/01/2018, 10:54 AM  [facility] [address], [place], [place], [address] Phone: [phone]   Fax:  [phone]  Name: [name] MRN: [id number] Date of Birth: Dec 15, 1934

## 2018-06-01 NOTE — Patient Instructions (Signed)
Trigger Point Dry Needling  . What is Trigger Point Dry Needling (DN)? o DN is a physical therapy technique used to treat muscle pain and dysfunction. Specifically, DN helps deactivate muscle trigger points (muscle knots).  o A thin filiform needle is used to penetrate the skin and stimulate the underlying trigger point. The goal is for a local twitch response (LTR) to occur and for the trigger point to relax. No medication of any kind is injected during the procedure.   . What Does Trigger Point Dry Needling Feel Like?  o The procedure feels different for each individual patient. Some patients report that they do not actually feel the needle enter the skin and overall the process is not painful. Very mild bleeding may occur. However, many patients feel a deep cramping in the muscle in which the needle was inserted. This is the local twitch response.   [name] How Will I feel after the treatment? o Soreness is normal, and the onset of soreness may not occur for a few hours. Typically this soreness does not last longer than two days.  o Bruising is uncommon, however; ice can be used to decrease any possible bruising.  o In rare cases feeling tired or nauseous after the treatment is normal. In addition, your symptoms may get worse before they get better, this period will typically not last longer than 24 hours.   . What Can I do After My Treatment? o Increase your hydration by drinking more water for the next 24 hours. o You may place ice or heat on the areas treated that have become sore, however, do not use heat on inflamed or bruised areas. Heat often brings more relief post needling. o You can continue your regular activities, but vigorous activity is not recommended initially after the treatment for 24 hours. o DN is best combined with other physical therapy such as strengthening, stretching, and other therapies.     Posture - Sitting   Sit upright, head facing forward. Try using a roll to  support lower back. Keep shoulders relaxed, and avoid rounded back. Keep hips level with knees. Avoid crossing legs for long periods.   Madelyn Flavors, PT 06/01/18 10:34 AM [facility] [address], [place],  [address] Phone # [phone] Fax [phone]

## 2018-06-02 ENCOUNTER — Encounter

## 2018-06-03 LAB — RESPIRATORY CULTURE OR RESPIRATORY AND SPUTUM CULTURE
MICRO NUMBER:: 91238498
SPECIMEN QUALITY:: ADEQUATE

## 2018-06-05 ENCOUNTER — Other Ambulatory Visit: Payer: Self-pay | Admitting: Adult Health

## 2018-06-05 DIAGNOSIS — J181 Lobar pneumonia, unspecified organism: Principal | ICD-10-CM

## 2018-06-05 DIAGNOSIS — J189 Pneumonia, unspecified organism: Secondary | ICD-10-CM

## 2018-06-05 MED ORDER — CIPROFLOXACIN HCL 500 MG PO TABS: 500.0000 mg | ORAL_TABLET | Freq: Two times a day (BID) | ORAL | 0 refills | Status: DC

## 2018-06-06 ENCOUNTER — Ambulatory Visit: Payer: Medicare Other | Admitting: Adult Health

## 2018-06-06 ENCOUNTER — Encounter (HOSPITAL_COMMUNITY): Payer: Self-pay

## 2018-06-07 ENCOUNTER — Ambulatory Visit: Payer: Medicare Other | Admitting: Physical Therapy

## 2018-06-07 ENCOUNTER — Encounter: Payer: Self-pay | Admitting: Physical Therapy

## 2018-06-07 DIAGNOSIS — M25652 Stiffness of left hip, not elsewhere classified: Secondary | ICD-10-CM

## 2018-06-07 DIAGNOSIS — M6281 Muscle weakness (generalized): Secondary | ICD-10-CM

## 2018-06-07 DIAGNOSIS — G8929 Other chronic pain: Secondary | ICD-10-CM | POA: Diagnosis not present

## 2018-06-07 DIAGNOSIS — R293 Abnormal posture: Secondary | ICD-10-CM

## 2018-06-07 DIAGNOSIS — M5442 Lumbago with sciatica, left side: Secondary | ICD-10-CM | POA: Diagnosis not present

## 2018-06-07 DIAGNOSIS — R252 Cramp and spasm: Secondary | ICD-10-CM | POA: Diagnosis not present

## 2018-06-07 NOTE — Therapy (Signed)
[facility] [address] [place], [place], [address] Phone: [phone]   Fax:  [phone]  Physical Therapy Treatment  Patient Details  Name: [name] MRN: [id number] Date of Birth: 12-Jun-1935 Referring Provider (PT): Hulan Fess, MD   Encounter Date: 06/07/2018  PT End of Session - 06/07/18 0933    Visit Number  3    Date for PT Re-Evaluation  07/19/18    Authorization Type  Medicare    PT Start Time  0932    PT Stop Time  1030    PT Time Calculation (min)  58 min    Activity Tolerance  Patient tolerated treatment well    Behavior During Therapy  [facility] for tasks assessed/performed       Past Medical History:  Diagnosis Date  . Allergic rhinitis, cause unspecified   . COPD (chronic obstructive pulmonary disease) ([place])   . Emphysema of lung ([place])   . Pulmonary nodule     Past Surgical History:  Procedure Laterality Date  . APPENDECTOMY    . ROTATOR CUFF REPAIR    . TONSILLECTOMY    . VASECTOMY      There were no vitals filed for this visit.  Subjective Assessment - 06/07/18 0935    Subjective  Much improved since last session. MAssage really helped.     Pertinent History  O2 dependent x 15 years, T11 compression fracture    Diagnostic tests  Dopler to rule out Lt leg clot- negative, x-ray at [facility]: DDD in lumbar spine    Patient Stated Goals  get relief from Lt LE pain    Currently in Pain?  Yes    Pain Score  3     Pain Location  Leg    Pain Orientation  Left;Posterior   Thigh post   Pain Descriptors / Indicators  Aching    Aggravating Factors   Sitting    Pain Relieving Factors  supine    Multiple Pain Sites  No                       [facility] Adult PT Treatment/Exercise - 06/07/18 0001      Lumbar Exercises: Supine   Glut Set  --   2x5 5 sec hold: gave for HEP   Clam  10 reps   Verbal cuing for lower abs     Knee/Hip Exercises: Stretches   Active  Hamstring Stretch  Left;3 reps;30 seconds      Cryotherapy   Number Minutes Cryotherapy  10 Minutes   post session   Cryotherapy Location  Lumbar Spine    Type of Cryotherapy  Ice pack      Manual Therapy   Manual Therapy  Soft tissue mobilization;Myofascial release    Soft tissue mobilization  to left HS    Myofascial Release  to medial left HS and L piriformis               PT Short Term Goals - 06/07/18 0946      PT SHORT TERM GOAL #1   Title  independent with initial HEP    Time  4    Period  Weeks    Status  Achieved      PT SHORT TERM GOAL #2   Title  understand correct body mechanics with daily activities and lifting and demonstrate correctly    Time  4    Period  Weeks  Status  On-going   Continues with poor posture and needs constant verbal cuing     PT SHORT TERM GOAL #3   Title  report a 30% reduction in Lt LE pain with sitting     Time  4    Period  Weeks    Status  Achieved   30%       PT Long Term Goals - 05/24/18 1217      PT LONG TERM GOAL #1   Title  be independent in advanced HEP    Time  8    Period  Weeks    Status  On-going    Target Date  07/19/18      PT LONG TERM GOAL #2   Title  reduce FOTO to < or = to 37% limitation    Time  8    Period  Weeks    Status  New    Target Date  07/19/18      PT LONG TERM GOAL #3   Title  report < or = to 2/10 Lt LE pain with coughing    Time  8    Period  Weeks    Status  New    Target Date  07/19/18      PT LONG TERM GOAL #4   Title  report a 60% reduction in the frequency and intensity of Lt LE pain with sitting     Time  8    Period  Weeks    Status  New    Target Date  07/19/18      PT LONG TERM GOAL #5   Title  improve Lt hamstring length to allow for sitting with neutral posture and perform Long arc quad without lag    Time  8    Period  Weeks    Target Date  07/19/18            Plan - 06/07/18 1004    Clinical Impression Statement  Pt felt much improved after  his last session. He feels the manual work made his leg hurt less. He continues to struggle with his posture. Discussed at length importance of standing/sitting with less foward flexion/stoop.  Pt can correct his posture, it is just a huge effort for him to maintain it. Current bout of pneumonia  limits pt some. Pt's HEP was progressed to add core and glute activation exercises.     Rehab Potential  Excellent    PT Frequency  2x / week    PT Duration  8 weeks    PT Treatment/Interventions  ADLs/Self Care Home Management;Cryotherapy;Electrical Stimulation;Ultrasound;Moist Heat;Traction;Functional mobility training;Therapeutic activities;Therapeutic exercise;Patient/family education;Neuromuscular re-education;Manual techniques;Passive range of motion;Dry needling;Taping    PT Next Visit Plan  Continue electrical stimulation if helpful, dry needling to Lt hamstring and manual therapy, hip flexibility    PT Home Exercise Plan   Access Code: [email or web address]     Consulted and Agree with Plan of Care  Patient       Patient will benefit from skilled therapeutic intervention in order to improve the following deficits and impairments:  Impaired flexibility, Decreased activity tolerance, Decreased endurance, Decreased range of motion, Decreased strength, Postural dysfunction, Increased muscle spasms, Improper body mechanics, Pain  Visit Diagnosis: Chronic left-sided low back pain with left-sided sciatica  Abnormal posture  Cramp and spasm  Muscle weakness (generalized)  Stiffness of left hip, not elsewhere classified     Problem List Patient Active Problem List   Diagnosis Date Noted  .  Left leg pain 05/18/2018  . Pneumonia 05/18/2018  . Chronic respiratory failure with hypoxia ([place]) 01/12/2015  . COPD with acute exacerbation ([place]) 01/21/2013  . Hyperlipidemia 12/01/2011  . Hypertension 12/01/2011  . CAD (coronary artery disease) 05/23/2011  . Lung nodule 09/16/2008  . ALLERGIC RHINITIS  07/24/2007  . COPD mixed type ([place]) 07/24/2007  . BURSITIS 07/24/2007    Zariah Cavendish, PTA 06/07/2018, 11:08 AM   [facility] [address], [place], [place], [address] Phone: [phone]   Fax:  [phone]  Name: [name] MRN: [id number] Date of Birth: Aug 18, 1934  Access Code: [email or web address]  URL: [email or web address]  Date: 06/07/2018  Prepared by: Myrene Galas   Exercises  Seated Hamstring Stretch - 3 reps - 20 hold - 4x daily - 7x weekly  Hooklying Single Knee to Chest - 3 reps - 1 sets - 20 hold - 3x daily - 7x weekly  Supine Gluteal Sets - 5 reps - 1 sets - 5 hold - 3x daily - 7x weekly  Bent Knee Fallouts - 10 reps - 2 sets - 2x daily - 7x weekly

## 2018-06-08 ENCOUNTER — Encounter (HOSPITAL_COMMUNITY): Payer: Self-pay

## 2018-06-09 ENCOUNTER — Encounter

## 2018-06-13 ENCOUNTER — Encounter (HOSPITAL_COMMUNITY): Payer: Self-pay

## 2018-06-13 ENCOUNTER — Ambulatory Visit (INDEPENDENT_AMBULATORY_CARE_PROVIDER_SITE_OTHER): Payer: Medicare Other | Admitting: Adult Health

## 2018-06-13 ENCOUNTER — Encounter: Payer: Self-pay | Admitting: Adult Health

## 2018-06-13 ENCOUNTER — Ambulatory Visit (INDEPENDENT_AMBULATORY_CARE_PROVIDER_SITE_OTHER)
Admission: RE | Admit: 2018-06-13 | Discharge: 2018-06-13 | Disposition: A | Discharge status: Home / Self Care | Payer: Medicare Other | Source: Ambulatory Visit | Attending: Adult Health | Admitting: Adult Health

## 2018-06-13 VITALS — BP 126/60 | HR 85 | Ht 69.0 in | Wt 150.2 lb

## 2018-06-13 DIAGNOSIS — J189 Pneumonia, unspecified organism: Secondary | ICD-10-CM

## 2018-06-13 DIAGNOSIS — J9611 Chronic respiratory failure with hypoxia: Secondary | ICD-10-CM | POA: Diagnosis not present

## 2018-06-13 DIAGNOSIS — J181 Lobar pneumonia, unspecified organism: Secondary | ICD-10-CM

## 2018-06-13 DIAGNOSIS — J441 Chronic obstructive pulmonary disease with (acute) exacerbation: Secondary | ICD-10-CM | POA: Diagnosis not present

## 2018-06-13 DIAGNOSIS — R911 Solitary pulmonary nodule: Secondary | ICD-10-CM | POA: Diagnosis not present

## 2018-06-13 NOTE — Assessment & Plan Note (Signed)
Cont on O2 .  

## 2018-06-13 NOTE — Patient Instructions (Addendum)
Finish Cipro as planned Set up CT chest  In 6 weeks .  Remain on Prednisone 10mg  daily for 1 week then 5mg  daily for 1 week and stop .  Continue on Symbicort and Spiriva Continue on DuoNeb Three times a day  .  Continue on  on Oxygen 3 L at rest 4l/m with walking .  Continue on Flutter valve Three times a day   Follow-up in 6  week with Dr. Annamaria Boots  And As needed   Please contact office for sooner follow up if symptoms do not improve or worsen or seek emergency care.

## 2018-06-13 NOTE — Assessment & Plan Note (Signed)
Psuedomonas PNA - slow to resolve  Clinically he is starting to improve on Cipro .  Will continue to monitor closely . Chest xray is stable today .  Will need to repeat CT chest in 6 weeks   Plan  Patient Instructions  Finish Cipro as planned Set up CT chest  In 6 weeks .  Remain on Prednisone 10mg  daily for 1 week then 5mg  daily for 1 week and stop .  Continue on Symbicort and Spiriva Continue on DuoNeb Three times a day  .  Continue on  on Oxygen 3 L at rest 4l/m with walking .  Continue on Flutter valve Three times a day   Follow-up in 6  week with Dr. Annamaria Boots  And As needed   Please contact office for sooner follow up if symptoms do not improve or worsen or seek emergency care.

## 2018-06-13 NOTE — Assessment & Plan Note (Signed)
Improving  Will taper off steroids over next 2 weeks .

## 2018-06-13 NOTE — Assessment & Plan Note (Addendum)
RUL nodule 2.5cm -will need to recheck in 6 weeks after antibiotics completed.  If not resolved with need to consider PET scan .    Plan  Patient Instructions  Finish Cipro as planned Set up CT chest  In 6 weeks .  Remain on Prednisone 10mg  daily for 1 week then 5mg  daily for 1 week and stop .  Continue on Symbicort and Spiriva Continue on DuoNeb Three times a day  .  Continue on  on Oxygen 3 L at rest 4l/m with walking .  Continue on Flutter valve Three times a day   Follow-up in 6  week with Dr. Annamaria Boots  And As needed   Please contact office for sooner follow up if symptoms do not improve or worsen or seek emergency care.

## 2018-06-13 NOTE — Progress Notes (Signed)
@Patient  ID: [name], male    DOB: August 30, 1934, 83 y.o.   MRN: [id number]  Chief Complaint  Patient presents with  . Follow-up    COPD     Referring provider: Hulan Fess, MD  HPI: 83 year old male former smoker followed for COPD/emphysema, hypoxic respiratory failure on oxygen Medical history significant for coronary artery disease  TEST PFT 2012>FEV1 23%, ratio 27, FVC 63%, DLCO 37% a1AT 05/04/12- MM Echo 2015 EF 50 to 55%, RV moderately dilated CT chest July 2017>resolution of pulmonary nodule left upper lobe.Remaining nodules stable as 2013 CT chest June 01, 2018 shows right basilar subpleural consolidation, irregular nodular densities left base, 2.5 cm nodule right upper lobe Venous Doppler October 2019- for DVT  06/13/2018 Follow up : PNA, COPD  Patient returns for a 2-week follow-up.  Patient was initially seen earlier this month for an acute office visit with cough and congestion.  He has started a Z-Pak at home.  Chest x-ray showed a left lower lobe pneumonia.  He was treated with a total of 7-day course of azithromycin.  Patient returned with minimal improvement in symptoms.  He was started on doxycycline and a prednisone taper for COPD exacerbation and slow to resolve pneumonia. Patient has had slow clinical improvement. He is also had some slow weight loss over the last year. Sputum culture was done last visit that showed Pseudomonas. He was treated with Cipro 500mg  for 10 days . He is on 8/10 days of Cipro.  Cough has resolved since last office visit. He continues to feel weak, no energy . No hemoptysis , fever, or chest pain. Appetite is very good, Weight is up 4lbs.   Patient has very severe COPD.  He is on Symbicort and Spiriva.  He is on oxygen 3 L at rest and 4 L with activities.       Allergies  Allergen Reactions  . Tolectin [Tolmetin Sodium] Other (See Comments)    BP low and passed out  . Augmentin [Amoxicillin-Pot Clavulanate]  Nausea And Vomiting  . Montelukast Sodium     Immunization History  Administered Date(s) Administered  . Influenza Split 04/16/2011, 05/04/2012, 04/16/2013, 06/13/2014, 04/20/2017  . Influenza, High Dose Seasonal PF 04/16/2018  . Influenza,inj,Quad PF,6+ Mos 03/31/2015, 04/15/2016  . Pneumococcal Conjugate-13 06/05/2013  . Pneumococcal Polysaccharide-23 01/23/1993, 04/17/1999  . Tdap 08/25/2010  . Zoster 07/14/2005    Past Medical History:  Diagnosis Date  . Allergic rhinitis, cause unspecified   . COPD (chronic obstructive pulmonary disease) ([place])   . Emphysema of lung ([place])   . Pulmonary nodule     Tobacco History: Social History   Tobacco Use  Smoking Status Former Smoker  . Last attempt to quit: 08/16/1990  . Years since quitting: 27.8  Smokeless Tobacco Never Used   Counseling given: Not Answered   Outpatient Medications Prior to Visit  Medication Sig Dispense Refill  . albuterol (PROAIR HFA) 108 (90 BASE) MCG/ACT inhaler Inhale 2 puffs into the lungs 4 (four) times daily as needed for wheezing or shortness of breath. 1 Inhaler 5  . aspirin EC 81 MG tablet Take 1 tablet (81 mg total) by mouth daily.    . ciprofloxacin (CIPRO) 500 MG tablet Take 1 tablet (500 mg total) by mouth 2 (two) times daily for 10 days. 20 tablet 0  . digoxin (LANOXIN) 0.25 MG tablet Take 250 mcg by mouth daily.      [name] ipratropium-albuterol (DUONEB) 0.5-2.5 (3) MG/3ML SOLN Take 3 mLs by  nebulization every 6 (six) hours as needed. 75 mL 12  . losartan (COZAAR) 25 MG tablet Take 1 tablet (25 mg total) daily by mouth. 90 tablet 3  . OXYGEN Inhale into the lungs.    . pravastatin (PRAVACHOL) 40 MG tablet Take 1 tablet by mouth daily.    . predniSONE (DELTASONE) 10 MG tablet Take 1 tablet (10 mg total) by mouth daily with breakfast. 30 tablet 1  . promethazine-codeine (PHENERGAN WITH CODEINE) 6.25-10 MG/5ML syrup Take 5 mLs by mouth every 6 (six) hours as needed for cough. 200 mL 0  . Respiratory  Therapy Supplies (FLUTTER) DEVI Use as directed 1 each 0  . SPIRIVA RESPIMAT 2.5 MCG/ACT AERS INHALE 2 PUFFS BY MOUTH EVERY DAY 4 g 11  . SYMBICORT 160-4.5 MCG/ACT inhaler INHALE 2 PUFFS BY MOUTH TWICE DAILY. RINSE MOUTH 10.2 g 5  . tamsulosin (FLOMAX) 0.4 MG CAPS capsule Take 1 capsule by mouth daily.  2  . doxycycline (VIBRA-TABS) 100 MG tablet Take 1 tablet (100 mg total) by mouth 2 (two) times daily. (Patient not taking: Reported on 06/13/2018) 14 tablet 0  . predniSONE (DELTASONE) 10 MG tablet 4 tabs for 3 days, then 3 tabs for 3 days, 2 tabs for 3 days, then 1 tab for 3 days, then stop (Patient not taking: Reported on 06/13/2018) 30 tablet 0   No facility-administered medications prior to visit.      Review of Systems  Constitutional:   No  weight loss, night sweats,  Fevers, chills,  +fatigue, or  lassitude.  HEENT:   No headaches,  Difficulty swallowing,  Tooth/dental problems, or  Sore throat,                No sneezing, itching, ear ache, nasal congestion, post nasal drip,   CV:  No chest pain,  Orthopnea, PND, swelling in lower extremities, anasarca, dizziness, palpitations, syncope.   GI  No heartburn, indigestion, abdominal pain, nausea, vomiting, diarrhea, change in bowel habits, loss of appetite, bloody stools.   Resp:    No chest wall deformity  Skin: no rash or lesions.  GU: no dysuria, change in color of urine, no urgency or frequency.  No flank pain, no hematuria   MS:  No joint pain or swelling.  No decreased range of motion.  No back pain.    Physical Exam  BP 126/60 (BP Location: Left Arm, Cuff Size: Normal)   Pulse 85   Ht 5\' 9"  (1.753 m)   Wt 150 lb 3.2 oz (68.1 kg)   SpO2 99%   BMI 22.18 kg/m   GEN: A/Ox3; pleasant , NAD, thin ,elderly on O2    HEENT:  /AT,  EACs-clear, TMs-wnl, NOSE-clear, THROAT-clear, no lesions, no postnasal drip or exudate noted.   NECK:  Supple w/ fair ROM; no JVD; normal carotid impulses w/o bruits; no thyromegaly or  nodules palpated; no lymphadenopathy.    RESP  Decreased BS in bases . no accessory muscle use, no dullness to percussion  CARD:  RRR, no m/r/g, no peripheral edema, pulses intact, no cyanosis or clubbing.  GI:   Soft & nt; nml bowel sounds; no organomegaly or masses detected.   Musco: Warm bil, no deformities or joint swelling noted.   Neuro: alert, no focal deficits noted.    Skin: Warm, no lesions or rashes    Lab Results:  CBC   BMET   BNP No results found for: BNP  ProBNP  Imaging: Dg Chest 2 View  Result Date: 06/13/2018 CLINICAL DATA:  Pneumonia left lower lobe follow-up EXAM: CHEST - 2 VIEW COMPARISON:  Chest CT 06/01/2018, chest x-ray 05/23/2018 FINDINGS: COPD with hyperinflation and emphysema. Right lateral chest not included on the study due to patient positioning. Heart size within normal limits. Negative for heart failure or effusion. Atherosclerotic aortic arch Infiltrate in the right posterior lung base seen on the prior CT continues to be present on the chest x-ray. No change from the recent chest x-ray. Mild airspace disease left posterior lung base best seen on prior CT and not definitely seen on the current chest x-ray. IMPRESSION: Severe COPD. Right lower lobe infiltrate continues to be present although better identified by CT. Continued radiographic or CT follow-up is recommended. Electronically Signed   By: Franchot Gallo M.D.   On: 06/13/2018 10:48   Dg Chest 2 View  Result Date: 05/23/2018 CLINICAL DATA:  Follow-up pneumonia, shortness of breath EXAM: CHEST - 2 VIEW COMPARISON:  05/18/2018 FINDINGS: Cardiac shadow is stable. Aortic calcifications are again seen. The lungs are hyperinflated consistent with COPD. Persistent density in the left posterior costophrenic angle is noted stable from the previous exam. No new focal infiltrate is seen. No bony abnormality is noted. Stable compression deformities in the lower thoracic spine are noted. IMPRESSION:  Stable density in the left posterior costophrenic angle. Again continued follow-up following appropriate therapy is recommended. Electronically Signed   By: Inez Catalina M.D.   On: 05/23/2018 11:54   Dg Chest 2 View  Result Date: 05/18/2018 CLINICAL DATA:  Shortness of breath, chest congestion, and cough for the past week. Former smoker. History of COPD. EXAM: CHEST - 2 VIEW COMPARISON:  Chest x-ray of January 30, 2018 FINDINGS: The lungs are hyperinflated with hemidiaphragm flattening. Slightly increased lung markings are noted in the left lower lobe and are more conspicuous than in the past. The heart and pulmonary vascularity are normal. The mediastinum is normal in width. There is calcification in the wall of the thoracic aorta. The bony thorax exhibits no acute abnormality. There is chronic partial compression of the body of T11. IMPRESSION: COPD. Subsegmental atelectasis or early pneumonia in the left lower lobe posteriorly. Followup PA and lateral chest X-ray is recommended in 3-4 weeks following trial of antibiotic therapy to ensure resolution and exclude underlying malignancy. Thoracic aortic atherosclerosis. Electronically Signed   By: David  Martinique M.D.   On: 05/18/2018 15:17   Ct Chest W Contrast  Result Date: 06/01/2018 CLINICAL DATA:  COPD and weight loss. EXAM: CT CHEST WITH CONTRAST TECHNIQUE: Multidetector CT imaging of the chest was performed during intravenous contrast administration. CONTRAST:  41mL ISOVUE-300 IOPAMIDOL (ISOVUE-300) INJECTION 61% COMPARISON:  03/04/2016 FINDINGS: Cardiovascular: The heart size is normal. Aortic atherosclerosis identified. Calcifications within the LAD, left circumflex coronary arteries noted. No pericardial effusion. Mediastinum/Nodes: Normal appearance of the thyroid gland. The trachea appears patent and is midline. Normal appearance of the esophagus. No mediastinal or hilar adenopathy identified. Lungs/Pleura: Moderate to advanced changes of emphysema.  Subpleural consolidation within the posterior right lower lobe measures 5.7 by 2.5 cm, image 139/5. Nodular areas of densities are identified within the posterior left base which measure up to 11 mm. Within the right upper lobe there is a 2.5 cm cystic and ground-glass nodule, image 65/5. New from 03/04/2016. Adjacent solid-appearing nodule is stable measuring 5 mm, image 59/5. Upper Abdomen: No acute abnormality identified. There is a cyst arising from lateral cortex of left kidney measuring 1.8 cm. Musculoskeletal: Spondylosis identified within  the thoracic spine. IMPRESSION: 1. Posterior right lung base subpleural consolidation is identified. Irregular nodular densities in the posterior left base also noted. The appearance favors an inflammatory/infectious process. Followup imaging is recommended to ensure resolution. If this does not resolve then recommend pulmonary consultation. 2. Non solid nodule within the right upper lobe measures 2.5 cm. Initial follow-up with CT at 6-12 months is recommended to confirm persistence. If persistent, repeat CT is recommended every 2 years until 5 years of stability has been established. This recommendation follows the consensus statement: Guidelines for Management of Incidental Pulmonary Nodules Detected on CT Images: From the Fleischner Society 2017; Radiology 2017; 284:228-243. 3. Aortic Atherosclerosis (ICD10-I70.0) and Emphysema (ICD10-J43.9). 4. Multi vessel coronary artery atherosclerotic calcifications Electronically Signed   By: Kerby Moors M.D.   On: 06/01/2018 15:23   Vas [place] Lower Extremity Venous (dvt)  Result Date: 05/19/2018  Lower Venous Study Indications: Swelling and pain of the left lower extremity off and on since August. Patient states pain feels like it's coming from the back of his thigh.  Performing Technologist: Chesley Noon RVT  Examination Guidelines: A complete evaluation includes B-mode imaging, spectral Doppler, color Doppler, and power  Doppler as needed of all accessible portions of each vessel. Bilateral testing is considered an integral part of a complete examination. Limited examinations for reoccurring indications may be performed as noted.  Right Venous Findings: +---+---------------+---------+-----------+----------+-------+    CompressibilityPhasicitySpontaneityPropertiesSummary +---+---------------+---------+-----------+----------+-------+ CFVFull           Yes      Yes                          +---+---------------+---------+-----------+----------+-------+  Left Venous Findings: +---------+---------------+---------+-----------+----------+-------+          CompressibilityPhasicitySpontaneityPropertiesSummary +---------+---------------+---------+-----------+----------+-------+ CFV      Full           Yes      Yes                          +---------+---------------+---------+-----------+----------+-------+ SFJ      Full           Yes      Yes                          +---------+---------------+---------+-----------+----------+-------+ FV Prox  Full           Yes      Yes                          +---------+---------------+---------+-----------+----------+-------+ FV Mid   Full           Yes      Yes                          +---------+---------------+---------+-----------+----------+-------+ FV DistalFull           Yes      Yes                          +---------+---------------+---------+-----------+----------+-------+ PFV      Full                                                 +---------+---------------+---------+-----------+----------+-------+  POP      Full           Yes      Yes                          +---------+---------------+---------+-----------+----------+-------+ PTV      Full           Yes      Yes                          +---------+---------------+---------+-----------+----------+-------+ PERO     Full           Yes      Yes                           +---------+---------------+---------+-----------+----------+-------+ Gastroc  Full                                                 +---------+---------------+---------+-----------+----------+-------+ GSV      Full           Yes      Yes                          +---------+---------------+---------+-----------+----------+-------+    Final Interpretation: Right: No evidence of common femoral vein obstruction. Left: No evidence of deep vein thrombosis in the lower extremity. No indirect evidence of obstruction proximal to the inguinal ligament. No cystic structure found in the popliteal fossa.  *See table(s) above for measurements and observations. Electronically signed by Ena Dawley MD on 05/19/2018 at 3:53:14 PM.    Final     levalbuterol Penne Lash) nebulizer solution 0.63 mg    Date Action Dose Route User   05/23/2018 1709 Given 0.63 mg Nebulization Joella Prince, RN    levalbuterol Penne Lash) nebulizer solution 0.63 mg    Date Action Dose Route User   05/30/2018 1711 Given 0.63 mg Nebulization Parke Poisson E, CMA      No flowsheet data found.  No results found for: NITRICOXIDE      Assessment & Plan:   Pneumonia Psuedomonas PNA - slow to resolve  Clinically he is starting to improve on Cipro .  Will continue to monitor closely . Chest xray is stable today .  Will need to repeat CT chest in 6 weeks   Plan  Patient Instructions  Finish Cipro as planned Set up CT chest  In 6 weeks .  Remain on Prednisone 10mg  daily for 1 week then 5mg  daily for 1 week and stop .  Continue on Symbicort and Spiriva Continue on DuoNeb Three times a day  .  Continue on  on Oxygen 3 L at rest 4l/m with walking .  Continue on Flutter valve Three times a day   Follow-up in 6  week with Dr. Annamaria Boots  And As needed   Please contact office for sooner follow up if symptoms do not improve or worsen or seek emergency care.       Chronic respiratory failure with hypoxia Cont on O2     COPD with acute exacerbation Improving  Will taper off steroids over next 2 weeks .   Lung nodule RUL nodule 2.5cm -will need to recheck in 6 weeks after antibiotics completed.  If not resolved with need to consider PET scan .    Plan  Patient Instructions  Finish Cipro as planned Set up CT chest  In 6 weeks .  Remain on Prednisone 10mg  daily for 1 week then 5mg  daily for 1 week and stop .  Continue on Symbicort and Spiriva Continue on DuoNeb Three times a day  .  Continue on  on Oxygen 3 L at rest 4l/m with walking .  Continue on Flutter valve Three times a day   Follow-up in 6  week with Dr. Annamaria Boots  And As needed   Please contact office for sooner follow up if symptoms do not improve or worsen or seek emergency care.          Rexene Edison, NP 06/13/2018

## 2018-06-14 ENCOUNTER — Ambulatory Visit: Payer: Medicare Other

## 2018-06-14 DIAGNOSIS — M25652 Stiffness of left hip, not elsewhere classified: Secondary | ICD-10-CM

## 2018-06-14 DIAGNOSIS — R252 Cramp and spasm: Secondary | ICD-10-CM

## 2018-06-14 DIAGNOSIS — R293 Abnormal posture: Secondary | ICD-10-CM | POA: Diagnosis not present

## 2018-06-14 DIAGNOSIS — G8929 Other chronic pain: Secondary | ICD-10-CM

## 2018-06-14 DIAGNOSIS — M6281 Muscle weakness (generalized): Secondary | ICD-10-CM

## 2018-06-14 DIAGNOSIS — M5442 Lumbago with sciatica, left side: Principal | ICD-10-CM

## 2018-06-14 NOTE — Therapy (Signed)
[facility] [address], [place], [place], [address] Phone: [phone]   Fax:  [phone]  Physical Therapy Treatment  Patient Details  Name: [name] MRN: [id number] Date of Birth: Oct 14, 1934 Referring Provider (PT): Hulan Fess, MD   Encounter Date: 06/14/2018  PT End of Session - 06/14/18 1225    Visit Number  4    Date for PT Re-Evaluation  07/19/18    Authorization Type  Medicare    PT Start Time  1147    PT Stop Time  1230    PT Time Calculation (min)  43 min    Activity Tolerance  Patient tolerated treatment well    Behavior During Therapy  [facility] for tasks assessed/performed       Past Medical History:  Diagnosis Date  . Allergic rhinitis, cause unspecified   . COPD (chronic obstructive pulmonary disease) ([place])   . Emphysema of lung ([place])   . Pulmonary nodule     Past Surgical History:  Procedure Laterality Date  . APPENDECTOMY    . ROTATOR CUFF REPAIR    . TONSILLECTOMY    . VASECTOMY      There were no vitals filed for this visit.  Subjective Assessment - 06/14/18 1145    Subjective  I am feeling so much better.  Thant deep massage is really doing it.  I haven't coughed over the past 5-6 days and that was what was aggravating.     Pertinent History  O2 dependent x 15 years, T11 compression fracture    Currently in Pain?  Yes    Pain Score  1     Pain Location  Leg    Pain Orientation  Left;Posterior    Pain Descriptors / Indicators  Aching    Pain Onset  More than a month ago    Pain Frequency  Constant    Aggravating Factors   sitting- very mild pain/pressure    Pain Relieving Factors  heat, change of position                       [facility] Adult PT Treatment/Exercise - 06/14/18 0001      Exercises   Exercises  Knee/Hip      Knee/Hip Exercises: Stretches   Active Hamstring Stretch  Left;3 reps;30 seconds      Knee/Hip Exercises: Standing   Hip Abduction   Stengthening;Both;1 set;10 reps    Hip Extension  Stengthening;Both;10 reps      Knee/Hip Exercises: Seated   Long Arc Quad  Strengthening;Both;2 sets;10 reps      Moist Heat Therapy   Number Minutes Moist Heat  10 Minutes    Moist Heat Location  Hip   Lt hamstring     Manual Therapy   Manual Therapy  Soft tissue mobilization;Myofascial release    Soft tissue mobilization  Lt hamstring    Myofascial Release  --               PT Short Term Goals - 06/14/18 1152      PT SHORT TERM GOAL #2   Title  understand correct body mechanics with daily activities and lifting and demonstrate correctly    Status  Achieved      PT SHORT TERM GOAL #3   Title  report a 30% reduction in Lt LE pain with sitting     Status  Achieved        PT Long Term Goals -  06/14/18 1152      PT LONG TERM GOAL #3   Title  report < or = to 2/10 Lt LE pain with coughing    Status  Achieved      PT LONG TERM GOAL #4   Title  report a 60% reduction in the frequency and intensity of Lt LE pain with sitting     Status  Achieved      PT LONG TERM GOAL #5   Title  improve Lt hamstring length to allow for sitting with neutral posture and perform Long arc quad without lag    Status  Achieved              Patient will benefit from skilled therapeutic intervention in order to improve the following deficits and impairments:     Visit Diagnosis: Chronic left-sided low back pain with left-sided sciatica  Abnormal posture  Cramp and spasm  Muscle weakness (generalized)  Stiffness of left hip, not elsewhere classified     Problem List Patient Active Problem List   Diagnosis Date Noted  . Left leg pain 05/18/2018  . Pneumonia 05/18/2018  . Chronic respiratory failure with hypoxia ([place]) 01/12/2015  . COPD with acute exacerbation ([place]) 01/21/2013  . Hyperlipidemia 12/01/2011  . Hypertension 12/01/2011  . CAD (coronary artery disease) 05/23/2011  . Lung nodule 09/16/2008  . ALLERGIC  RHINITIS 07/24/2007  . COPD mixed type ([place]) 07/24/2007  . BURSITIS 07/24/2007   Sigurd Sos, PT 06/14/18 12:28 PM  [facility] [address] [place], [place], [address] Phone: [phone]   Fax:  [phone]  Name: WINDELL MUSSON MRN: [id number] Date of Birth: [birth year]

## 2018-06-15 ENCOUNTER — Encounter: Payer: Self-pay | Admitting: Cardiovascular Disease

## 2018-06-15 ENCOUNTER — Ambulatory Visit (INDEPENDENT_AMBULATORY_CARE_PROVIDER_SITE_OTHER): Payer: Medicare Other | Admitting: Cardiovascular Disease

## 2018-06-15 ENCOUNTER — Encounter (HOSPITAL_COMMUNITY): Payer: Self-pay

## 2018-06-15 VITALS — BP 140/72 | HR 92 | Ht 69.0 in | Wt 151.8 lb

## 2018-06-15 DIAGNOSIS — I2510 Atherosclerotic heart disease of native coronary artery without angina pectoris: Secondary | ICD-10-CM

## 2018-06-15 DIAGNOSIS — E782 Mixed hyperlipidemia: Secondary | ICD-10-CM

## 2018-06-15 DIAGNOSIS — I10 Essential (primary) hypertension: Secondary | ICD-10-CM | POA: Diagnosis not present

## 2018-06-15 DIAGNOSIS — I700 Atherosclerosis of aorta: Secondary | ICD-10-CM | POA: Diagnosis not present

## 2018-06-15 DIAGNOSIS — I471 Supraventricular tachycardia: Secondary | ICD-10-CM | POA: Diagnosis not present

## 2018-06-15 NOTE — Patient Instructions (Addendum)
Medication Instructions:  Your provider recommends that you continue on your current medications as directed. Please refer to the Current Medication list given to you today.    Labwork: None  Testing/Procedures: Your provider has requested that you have an echocardiogram. Echocardiography is a painless test that uses sound waves to create images of your heart. It provides your doctor with information about the size and shape of your heart and how well your heart's chambers and valves are working. This procedure takes approximately one hour. There are no restrictions for this procedure. Your echocardiogram is scheduled Monday, July 31, 2018. Please arrive by 11:00AM for this appointment.  Follow-Up: Your provider wants you to follow-up in: 1 year with Dr. Burt Knack. Please call 3 months in advance to try to arrange your appointment.  Any Other Special Instructions Will Be Listed Below (If Applicable).     If you need a refill on your cardiac medications before your next appointment, please call your pharmacy.

## 2018-06-15 NOTE — Progress Notes (Signed)
Cardiology Office Note:    Date:  06/15/2018   ID:  [name], DOB 1934/10/21, MRN [id number]  PCP:  Hulan Fess, MD  Cardiologist:  No primary care provider on file.  Electrophysiologist:  None   Referring MD: Hulan Fess, MD   Chief Complaint  Patient presents with  . Shortness of Breath    History of Present Illness:    [name] is a 83 y.o. male with a hx of coronary artery disease, presenting for follow-up evaluation.  The patient has had coronary calcification identified on CT imaging.  He also has had aortic atherosclerosis and calcification identified.  He had a remote cardiac catheterization in 2003 demonstrating nonobstructive CAD with the exception of an 80% ostial ramus stenosis involving a small branch vessel.  He is never had symptoms of angina.  The patient is here alone today.  He has severe chronic lung disease on continuous home oxygen.  He still participates in pulmonary rehab.  He denies chest pain or pressure.  He has recently been diagnosed with pneumonia and is undergone treatment for Pseudomonas.  There are plans to perform a follow-up chest CT in about 6 weeks.  He continues to have shortness of breath with activity.  He denies orthopnea or PND.  He has chronic leg swelling which is worse with the use of prednisone.  He denies heart palpitations, lightheadedness, or syncope.  States that his home blood pressure readings are generally in the range of 130/65-70.  Past Medical History:  Diagnosis Date  . Allergic rhinitis, cause unspecified   . COPD (chronic obstructive pulmonary disease) ([place])   . Emphysema of lung ([place])   . Pulmonary nodule     Past Surgical History:  Procedure Laterality Date  . APPENDECTOMY    . ROTATOR CUFF REPAIR    . TONSILLECTOMY    . VASECTOMY      Current Medications: Current Meds  Medication Sig  . albuterol (PROAIR HFA) 108 (90 BASE) MCG/ACT inhaler Inhale 2 puffs into the lungs 4 (four) times daily as  needed for wheezing or shortness of breath.  [name] aspirin EC 81 MG tablet Take 1 tablet (81 mg total) by mouth daily.  . digoxin (LANOXIN) 0.25 MG tablet Take 250 mcg by mouth daily.    [name] ipratropium-albuterol (DUONEB) 0.5-2.5 (3) MG/3ML SOLN Take 3 mLs by nebulization every 6 (six) hours as needed.  [name] losartan (COZAAR) 25 MG tablet Take 1 tablet (25 mg total) daily by mouth.  . OXYGEN Inhale into the lungs.  . pravastatin (PRAVACHOL) 40 MG tablet Take 1 tablet by mouth daily.  . predniSONE (DELTASONE) 10 MG tablet Take 1 tablet (10 mg total) by mouth daily with breakfast.  . promethazine-codeine (PHENERGAN WITH CODEINE) 6.25-10 MG/5ML syrup Take 5 mLs by mouth every 6 (six) hours as needed for cough.  [name] Respiratory Therapy Supplies (FLUTTER) DEVI Use as directed  . SPIRIVA RESPIMAT 2.5 MCG/ACT AERS INHALE 2 PUFFS BY MOUTH EVERY DAY  . SYMBICORT 160-4.5 MCG/ACT inhaler INHALE 2 PUFFS BY MOUTH TWICE DAILY. RINSE MOUTH  . tamsulosin (FLOMAX) 0.4 MG CAPS capsule Take 1 capsule by mouth daily.     Allergies:   Tolectin [tolmetin sodium]; Augmentin [amoxicillin-pot clavulanate]; and Montelukast sodium   Social History   Socioeconomic History  . Marital status: Married    Spouse name: Not on file  . Number of children: Not on file  . Years of education: Not on file  . Highest education level: Not  on file  Occupational History  . Not on file  Social Needs  . Financial resource strain: Not on file  . Food insecurity:    Worry: Not on file    Inability: Not on file  . Transportation needs:    Medical: Not on file    Non-medical: Not on file  Tobacco Use  . Smoking status: Former Smoker    Last attempt to quit: 08/16/1990    Years since quitting: 27.8  . Smokeless tobacco: Never Used  Substance and Sexual Activity  . Alcohol use: No  . Drug use: No  . Sexual activity: Not on file  Lifestyle  . Physical activity:    Days per week: Not on file    Minutes per session: Not on file  .  Stress: Not on file  Relationships  . Social connections:    Talks on phone: Not on file    Gets together: Not on file    Attends religious service: Not on file    Active member of club or organization: Not on file    Attends meetings of clubs or organizations: Not on file    Relationship status: Not on file  Other Topics Concern  . Not on file  Social History Narrative  . Not on file     Family History: The patient's family history includes COPD in his mother and sister; Coronary artery disease in his unknown relative; Stroke in his father and mother.  ROS:   Please see the history of present illness.    Positive for leg swelling, cough, fatigue, recent fever, leg pain, wheezing, constipation all other systems reviewed and are negative.  EKGs/Labs/Other Studies Reviewed:    EKG:  EKG is ordered today.  The ekg ordered today demonstrates normal sinus rhythm 92 bpm, rightward axis, pulmonary disease pattern.  Recent Labs: 05/23/2018: BUN 17; Creatinine, Ser 0.56; Hemoglobin 13.2; Platelets 290.0; Potassium 4.2; Pro B Natriuretic peptide (BNP) 109.0; Sodium 133  Recent Lipid Panel No results found for: CHOL, TRIG, HDL, CHOLHDL, VLDL, LDLCALC, LDLDIRECT  Physical Exam:    VS:  BP 140/72   Pulse 92   Ht 5\' 9"  (1.753 m)   Wt 151 lb 12.8 oz (68.9 kg)   SpO2 100%   BMI 22.42 kg/m     Wt Readings from Last 3 Encounters:  06/15/18 151 lb 12.8 oz (68.9 kg)  06/13/18 150 lb 3.2 oz (68.1 kg)  05/30/18 146 lb (66.2 kg)     GEN: Well nourished, well developed in no acute distress HEENT: Normal NECK: No JVD; No carotid bruits LYMPHATICS: No lymphadenopathy CARDIAC: RRR, no murmurs, rubs, gallops RESPIRATORY:  Diminished breath sounds bilaterally, no wheezing or rhonchi  ABDOMEN: Soft, non-tender, non-distended MUSCULOSKELETAL:  1+ bilateral ankle edema; No deformity  SKIN: Warm and dry NEUROLOGIC:  Alert and oriented x 3 PSYCHIATRIC:  Normal affect   ASSESSMENT:    1.  Coronary artery disease involving native coronary artery of native heart without angina pectoris   2. Paroxysmal SVT (supraventricular tachycardia) ([place])   3. Essential hypertension   4. Mixed hyperlipidemia   5. Atherosclerosis of aorta ([facility])    PLAN:    In order of problems listed above:  1. Overall the patient appears stable.  His most recent echocardiogram from 2015 is reviewed and he is noted to have RA and RV dilatation.  Considering the severity of his chronic lung disease and risk of progressive right heart related symptoms, I have recommended a repeat echocardiogram.  2. The patient has a remote history of paroxysmal SVT and he has been on digoxin since the 1980s.  We discussed the pros and cons of this.  He prefers to stay on it.  His last digoxin level from earlier this year was in normal range. 3. Blood pressure is well controlled.  He continues on losartan 25 mg. 4. The patient's hyperlipidemia is treated with pravastatin 40 mg daily. 5. He is noted to have aortic and coronary calcification/atherosclerosis on CT imaging.  He is treated with aspirin 81 mg and a statin drug.   Medication Adjustments/Labs and Tests Ordered: Current medicines are reviewed at length with the patient today.  Concerns regarding medicines are outlined above.  Orders Placed This Encounter  Procedures  . EKG 12-Lead  . ECHOCARDIOGRAM COMPLETE   No orders of the defined types were placed in this encounter.   Patient Instructions  Medication Instructions:  Your provider recommends that you continue on your current medications as directed. Please refer to the Current Medication list given to you today.    Labwork: None  Testing/Procedures: Your provider has requested that you have an echocardiogram. Echocardiography is a painless test that uses sound waves to create images of your heart. It provides your doctor with information about the size and shape of your heart and how well your heart's  chambers and valves are working. This procedure takes approximately one hour. There are no restrictions for this procedure. Your echocardiogram is scheduled Monday, July 31, 2018. Please arrive by 11:00AM for this appointment.  Follow-Up: Your provider wants you to follow-up in: 1 year with Dr. Burt Knack. Please call 3 months in advance to try to arrange your appointment.  Any Other Special Instructions Will Be Listed Below (If Applicable).     If you need a refill on your cardiac medications before your next appointment, please call your pharmacy.      Signed, Sherren Mocha, MD  06/15/2018 12:47 PM    [place]

## 2018-06-16 ENCOUNTER — Ambulatory Visit: Payer: Medicare Other | Attending: Family Medicine | Admitting: Physical Therapy

## 2018-06-16 ENCOUNTER — Encounter: Payer: Self-pay | Admitting: Physical Therapy

## 2018-06-16 DIAGNOSIS — G8929 Other chronic pain: Secondary | ICD-10-CM

## 2018-06-16 DIAGNOSIS — M6281 Muscle weakness (generalized): Secondary | ICD-10-CM | POA: Diagnosis not present

## 2018-06-16 DIAGNOSIS — R293 Abnormal posture: Secondary | ICD-10-CM | POA: Insufficient documentation

## 2018-06-16 DIAGNOSIS — M5442 Lumbago with sciatica, left side: Secondary | ICD-10-CM | POA: Diagnosis not present

## 2018-06-16 DIAGNOSIS — M25652 Stiffness of left hip, not elsewhere classified: Secondary | ICD-10-CM | POA: Insufficient documentation

## 2018-06-16 DIAGNOSIS — R252 Cramp and spasm: Secondary | ICD-10-CM | POA: Insufficient documentation

## 2018-06-16 NOTE — Therapy (Signed)
[facility] [address] [place], [place], [address] Phone: [phone]   Fax:  [phone]  Physical Therapy Treatment  Patient Details  Name: [name] MRN: [id number] Date of Birth: 11/21/1934 Referring Provider (PT): Hulan Fess, MD   Encounter Date: 06/16/2018  PT End of Session - 06/16/18 1010    Visit Number  5    Date for PT Re-Evaluation  07/19/18    Authorization Type  Medicare    PT Start Time  1010    PT Stop Time  1050    PT Time Calculation (min)  40 min    Activity Tolerance  Patient tolerated treatment well    Behavior During Therapy  [facility] for tasks assessed/performed       Past Medical History:  Diagnosis Date  . Allergic rhinitis, cause unspecified   . COPD (chronic obstructive pulmonary disease) ([place])   . Emphysema of lung ([place])   . Pulmonary nodule     Past Surgical History:  Procedure Laterality Date  . APPENDECTOMY    . ROTATOR CUFF REPAIR    . TONSILLECTOMY    . VASECTOMY      There were no vitals filed for this visit.  Subjective Assessment - 06/16/18 1032    Subjective  97% improved, pt reports. " I really want the deep tissue massage into those knots."    Pertinent History  O2 dependent x 15 years, T11 compression fracture    Currently in Pain?  Yes    Pain Score  1     Pain Location  Leg    Pain Orientation  Left;Posterior    Pain Descriptors / Indicators  Dull    Multiple Pain Sites  No                       [facility] Adult PT Treatment/Exercise - 06/16/18 0001      Moist Heat Therapy   Number Minutes Moist Heat  10 Minutes    Moist Heat Location  Hip   Lt hamstring     Manual Therapy   Manual Therapy  Soft tissue mobilization;Myofascial release    Soft tissue mobilization  Lt hamstring, trigger point work proxiimal hamstring, proximal to ischial tube. TP nonpainful per pt report.                PT Short Term Goals - 06/14/18 1152       PT SHORT TERM GOAL #2   Title  understand correct body mechanics with daily activities and lifting and demonstrate correctly    Status  Achieved      PT SHORT TERM GOAL #3   Title  report a 30% reduction in Lt LE pain with sitting     Status  Achieved        PT Long Term Goals - 06/14/18 1152      PT LONG TERM GOAL #3   Title  report < or = to 2/10 Lt LE pain with coughing    Status  Achieved      PT LONG TERM GOAL #4   Title  report a 60% reduction in the frequency and intensity of Lt LE pain with sitting     Status  Achieved      PT LONG TERM GOAL #5   Title  improve Lt hamstring length to allow for sitting with neutral posture and perform Long arc quad without lag    Status  Achieved  Plan - 06/16/18 1041    Clinical Impression Statement  Pt reporting 97% improved/less pain since eval. Pt remains with palpable trigger points in proximal hamstring but they are not tender/painful per pt today. Pt noncompliant with glute/hamstring activation. Reviewed ( briefly, not enought to bill) glute squeezes and isometric hip extension into the bed.      Rehab Potential  Excellent    PT Frequency  2x / week    PT Duration  8 weeks    PT Treatment/Interventions  ADLs/Self Care Home Management;Cryotherapy;Electrical Stimulation;Ultrasound;Moist Heat;Traction;Functional mobility training;Therapeutic activities;Therapeutic exercise;Patient/family education;Neuromuscular re-education;Manual techniques;Passive range of motion;Dry needling;Taping    PT Next Visit Plan  Possible DC next session    PT Home Exercise Plan   Access Code: [email or web address]     Consulted and Agree with Plan of Care  Patient       Patient will benefit from skilled therapeutic intervention in order to improve the following deficits and impairments:  Impaired flexibility, Decreased activity tolerance, Decreased endurance, Decreased range of motion, Decreased strength, Postural dysfunction, Increased muscle  spasms, Improper body mechanics, Pain  Visit Diagnosis: Chronic left-sided low back pain with left-sided sciatica  Abnormal posture  Cramp and spasm  Muscle weakness (generalized)  Stiffness of left hip, not elsewhere classified     Problem List Patient Active Problem List   Diagnosis Date Noted  . Left leg pain 05/18/2018  . Pneumonia 05/18/2018  . Chronic respiratory failure with hypoxia ([place]) 01/12/2015  . COPD with acute exacerbation ([place]) 01/21/2013  . Hyperlipidemia 12/01/2011  . Hypertension 12/01/2011  . CAD (coronary artery disease) 05/23/2011  . Lung nodule 09/16/2008  . ALLERGIC RHINITIS 07/24/2007  . COPD mixed type ([place]) 07/24/2007  . BURSITIS 07/24/2007    Orel Cooler, PTA 06/16/2018, 10:44 AM  [facility] [address] [place], [place], [address] Phone: [phone]   Fax:  [phone]  Name: AAYANSH CODISPOTI MRN: [id number] Date of Birth: Sep 19, 1934

## 2018-06-19 NOTE — Telephone Encounter (Signed)
Per TP: I'm confused, patient's chart shows that it has been released to MyChart and viewed by the patient.  Unsure how to release these again.  We also discussed the results of the cxr at the 10/29 office visit (cxr was done prior to visit).  If he is still feeling poorly and would like to discuss these results again, would recommend that he come in for office visit WITH DR YOUNG.  He has an opening tomorrow morning at 9am.  Thanks.

## 2018-06-19 NOTE — Telephone Encounter (Signed)
Tammy please advise on patient email:  The chest x-rays from October 29 have not been posted to [facility]. When will they be? Did the October 29 x-ray show improvement from October 17 x-rays. Still not feeling a whole lot better. No cough or congestion, just tired and no energy. How long did you say it would take until back to normal?

## 2018-06-20 ENCOUNTER — Ambulatory Visit: Payer: Medicare Other

## 2018-06-20 ENCOUNTER — Encounter (HOSPITAL_COMMUNITY): Payer: Self-pay

## 2018-06-20 DIAGNOSIS — M5442 Lumbago with sciatica, left side: Principal | ICD-10-CM

## 2018-06-20 DIAGNOSIS — R252 Cramp and spasm: Secondary | ICD-10-CM

## 2018-06-20 DIAGNOSIS — M6281 Muscle weakness (generalized): Secondary | ICD-10-CM | POA: Diagnosis not present

## 2018-06-20 DIAGNOSIS — M25652 Stiffness of left hip, not elsewhere classified: Secondary | ICD-10-CM | POA: Diagnosis not present

## 2018-06-20 DIAGNOSIS — G8929 Other chronic pain: Secondary | ICD-10-CM | POA: Diagnosis not present

## 2018-06-20 DIAGNOSIS — R293 Abnormal posture: Secondary | ICD-10-CM

## 2018-06-20 NOTE — Therapy (Addendum)
[facility] [address] [place], [place], [address] Phone: [phone]   Fax:  [phone]  Physical Therapy Treatment  Patient Details  Name: [name] MRN: [id number] Date of Birth: May 18, 1935 Referring Provider (PT): Hulan Fess, MD   Encounter Date: 06/20/2018  PT End of Session - 06/20/18 1134    Visit Number  6    Date for PT Re-Evaluation  07/19/18    Authorization Type  Medicare    PT Start Time  1100    PT Stop Time  1147    PT Time Calculation (min)  47 min    Activity Tolerance  Patient tolerated treatment well    Behavior During Therapy  [facility] for tasks assessed/performed       Past Medical History:  Diagnosis Date  . Allergic rhinitis, cause unspecified   . COPD (chronic obstructive pulmonary disease) ([place])   . Emphysema of lung ([place])   . Pulmonary nodule     Past Surgical History:  Procedure Laterality Date  . APPENDECTOMY    . ROTATOR CUFF REPAIR    . TONSILLECTOMY    . VASECTOMY      There were no vitals filed for this visit.      [facility] PT Assessment - 06/20/18 0001      Assessment   Medical Diagnosis  lumbar radiculopathy, Lt leg pain    Onset Date/Surgical Date  03/16/18      Precautions   Precautions  Other (comment)   )2 dependent     Prior Function   Level of Independence  Independent    Vocation  Retired    Leisure  none      Cognition   Overall Cognitive Status  Within Functional Limits for tasks assessed      Observation/Other Assessments   Focus on Therapeutic Outcomes (FOTO)   31% limitaiton                   [facility] Adult PT Treatment/Exercise - 06/20/18 0001      Moist Heat Therapy   Number Minutes Moist Heat  10 Minutes    Moist Heat Location  Hip      Manual Therapy   Manual Therapy  Soft tissue mobilization;Myofascial release    Soft tissue mobilization  Lt hamstring, trigger point work proxiimal hamstring, proximal to ischial  tube. TP nonpainful per pt report.                PT Short Term Goals - 06/14/18 1152      PT SHORT TERM GOAL #2   Title  understand correct body mechanics with daily activities and lifting and demonstrate correctly    Status  Achieved      PT SHORT TERM GOAL #3   Title  report a 30% reduction in Lt LE pain with sitting     Status  Achieved        PT Long Term Goals - 06/20/18 1113      PT LONG TERM GOAL #1   Title  be independent in advanced HEP    Status  Achieved      PT LONG TERM GOAL #2   Title  reduce FOTO to < or = to 37% limitation    Baseline  31%     Status  Achieved      PT LONG TERM GOAL #3   Title  report < or = to 2/10 Lt LE pain with coughing  Baseline  up to 5/10    Status  Partially Met      PT LONG TERM GOAL #4   Title  report a 60% reduction in the frequency and intensity of Lt LE pain with sitting     Baseline  97% limitation      PT LONG TERM GOAL #5   Title  improve Lt hamstring length to allow for sitting with neutral posture and perform Long arc quad without lag    Status  Achieved            Plan - 06/20/18 1141    Clinical Impression Statement  Pt reports 97% overall improvement in Lt hamstring pain overall.  Pt does report up to 5/10 brief pain with coughing but is otherwise pain free.  FOTO is improved to 31% limitation and pt has HEP in place.  Pt with localized trigger point over proximal medial head of hamstring and demonstrated improved tissue mobility after soft tissue mobilization today.  Pt will be placed on hold until 07/19/18 and will be discharged if he doesn't need to return.      Rehab Potential  Excellent    PT Frequency  2x / week    PT Duration  8 weeks    PT Treatment/Interventions  ADLs/Self Care Home Management;Cryotherapy;Electrical Stimulation;Ultrasound;Moist Heat;Traction;Functional mobility training;Therapeutic activities;Therapeutic exercise;Patient/family education;Neuromuscular re-education;Manual  techniques;Passive range of motion;Dry needling;Taping    PT Next Visit Plan  D/C if pt doenst return.      PT Home Exercise Plan   Access Code: [email or web address]     Recommended Other Services  initial certification is signed    Consulted and Agree with Plan of Care  Patient       Patient will benefit from skilled therapeutic intervention in order to improve the following deficits and impairments:  Impaired flexibility, Decreased activity tolerance, Decreased endurance, Decreased range of motion, Decreased strength, Postural dysfunction, Increased muscle spasms, Improper body mechanics, Pain  Visit Diagnosis: Chronic left-sided low back pain with left-sided sciatica  Abnormal posture  Cramp and spasm  Muscle weakness (generalized)  Stiffness of left hip, not elsewhere classified     Problem List Patient Active Problem List   Diagnosis Date Noted  . Left leg pain 05/18/2018  . Pneumonia 05/18/2018  . Chronic respiratory failure with hypoxia ([place]) 01/12/2015  . COPD with acute exacerbation ([place]) 01/21/2013  . Hyperlipidemia 12/01/2011  . Hypertension 12/01/2011  . CAD (coronary artery disease) 05/23/2011  . Lung nodule 09/16/2008  . ALLERGIC RHINITIS 07/24/2007  . COPD mixed type ([place]) 07/24/2007  . BURSITIS 07/24/2007     Sigurd Sos, PT 06/20/18 11:42 AM PHYSICAL THERAPY DISCHARGE SUMMARY  Visits from Start of Care: 6  Current functional level related to goals / functional outcomes: See above for current status.     Remaining deficits: Pt denied any deficits at his final session on 06/20/18.  Pt has HEP in place.     Education / Equipment: HEP Plan: Patient agrees to discharge.  Patient goals were met. Patient is being discharged due to meeting the stated rehab goals.  ?????         Sigurd Sos, PT 07/18/18 9:59 AM   [facility] [address], [place], [place], [address] Phone: [phone]    Fax:  [phone]  Name: [name] MRN: [id number] Date of Birth: 1934-12-21

## 2018-06-22 ENCOUNTER — Ambulatory Visit: Payer: Medicare Other

## 2018-06-22 ENCOUNTER — Encounter (HOSPITAL_COMMUNITY): Payer: Self-pay

## 2018-06-27 ENCOUNTER — Encounter: Payer: Medicare Other | Admitting: Physical Therapy

## 2018-06-27 ENCOUNTER — Encounter (HOSPITAL_COMMUNITY): Payer: Self-pay

## 2018-06-28 DIAGNOSIS — M4854XD Collapsed vertebra, not elsewhere classified, thoracic region, subsequent encounter for fracture with routine healing: Secondary | ICD-10-CM | POA: Diagnosis not present

## 2018-06-29 ENCOUNTER — Encounter (HOSPITAL_COMMUNITY): Payer: Self-pay

## 2018-07-04 ENCOUNTER — Encounter (HOSPITAL_COMMUNITY): Payer: Self-pay

## 2018-07-06 ENCOUNTER — Encounter (HOSPITAL_COMMUNITY): Payer: Self-pay

## 2018-07-11 ENCOUNTER — Encounter (HOSPITAL_COMMUNITY): Payer: Self-pay

## 2018-07-18 ENCOUNTER — Encounter (HOSPITAL_COMMUNITY): Payer: Self-pay

## 2018-07-18 NOTE — Telephone Encounter (Signed)
I suggest we cancel the ordered CT for now. We can review images and discuss at next visit.  Order- D/C scheduled Chest CT

## 2018-07-20 ENCOUNTER — Telehealth: Payer: Self-pay | Admitting: Internal Medicine

## 2018-07-20 ENCOUNTER — Encounter (HOSPITAL_COMMUNITY): Payer: Self-pay

## 2018-07-20 ENCOUNTER — Inpatient Hospital Stay: Admission: RE | Admit: 2018-07-20 | Payer: Medicare Other | Source: Ambulatory Visit

## 2018-07-20 MED ORDER — LEVOFLOXACIN 750 MG PO TABS: 750.0000 mg | ORAL_TABLET | Freq: Every day | ORAL | 0 refills | Status: DC

## 2018-07-20 NOTE — Telephone Encounter (Signed)
Suggest Levaquin 750 mg, # 7, 1 daily

## 2018-07-20 NOTE — Telephone Encounter (Signed)
Spoke with pt, aware of recs.  rx sent to preferred pharmacy.  Nothing further needed.  

## 2018-07-20 NOTE — Telephone Encounter (Signed)
Called and spoke to patient, states he is coughing up green mucous, denies fever, increased SOB, for the last 3 days and worse this morning. Appointment offered when patient was made aware that his appointment would be with a NP he asked that we get CY recommendations first because he prefers to see CY.  CY please advise.

## 2018-07-25 ENCOUNTER — Encounter (HOSPITAL_COMMUNITY): Payer: Self-pay

## 2018-07-27 ENCOUNTER — Encounter (HOSPITAL_COMMUNITY): Payer: Self-pay

## 2018-07-31 ENCOUNTER — Other Ambulatory Visit: Payer: Self-pay

## 2018-07-31 ENCOUNTER — Ambulatory Visit (HOSPITAL_COMMUNITY): Payer: Medicare Other | Attending: Cardiology

## 2018-07-31 ENCOUNTER — Other Ambulatory Visit: Payer: Medicare Other

## 2018-07-31 DIAGNOSIS — I2510 Atherosclerotic heart disease of native coronary artery without angina pectoris: Secondary | ICD-10-CM | POA: Insufficient documentation

## 2018-08-01 ENCOUNTER — Encounter (HOSPITAL_COMMUNITY): Payer: Self-pay

## 2018-08-01 ENCOUNTER — Ambulatory Visit (INDEPENDENT_AMBULATORY_CARE_PROVIDER_SITE_OTHER): Payer: Medicare Other | Admitting: Internal Medicine

## 2018-08-01 ENCOUNTER — Encounter: Payer: Self-pay | Admitting: Internal Medicine

## 2018-08-01 VITALS — BP 136/68 | HR 89 | Ht 69.0 in | Wt 153.0 lb

## 2018-08-01 DIAGNOSIS — J441 Chronic obstructive pulmonary disease with (acute) exacerbation: Secondary | ICD-10-CM

## 2018-08-01 DIAGNOSIS — I2510 Atherosclerotic heart disease of native coronary artery without angina pectoris: Secondary | ICD-10-CM | POA: Diagnosis not present

## 2018-08-01 DIAGNOSIS — J449 Chronic obstructive pulmonary disease, unspecified: Secondary | ICD-10-CM | POA: Diagnosis not present

## 2018-08-01 DIAGNOSIS — J9611 Chronic respiratory failure with hypoxia: Secondary | ICD-10-CM | POA: Diagnosis not present

## 2018-08-01 NOTE — Assessment & Plan Note (Signed)
He is dependent on continuous oxygen at 3-5 L.

## 2018-08-01 NOTE — Patient Instructions (Addendum)
Sample x 2 Spiriva Respimat 2.5  Sample x 2 Symbicort 160  Order- Ok to resume Pulmonary Rehab- pneumonia has resolved    Order- future CXR in 4 months for next visit-   Dx COPD mixed type  Please call as needed

## 2018-08-01 NOTE — Progress Notes (Signed)
Patient ID: JOSEANGEL NETTLETON, male    DOB: Aug 23, 1934, 83 y.o.   MRN: [id number] PCP Dr Hulan Fess  HPI male former smoker followed for COPD/emphysema, pulmonary nodules, hypoxic respiratory failure, complicated by CAD K7QQ 5/95/63-  MM PFT 04/27/11- ------------------------------------------------------------------------------ 01/30/2018- 83 year old male former smoker followed for COPD/emphysema, pulmonary nodules, hypoxic respiratory failure, complicated by CAD O2 3 L/[facility] -----Pt states he is about the same since last visit. Pt states he believes SOB has become worse. Denies any cough or CP. Sensitive to weather changes-currently the hot humid weather.  Currently on an antibiotic for skin infection after a fall.  Little cough or wheeze.  Uses oxygen all the time.  Sometimes needs to turn his liquid system up to 4 L during exertion.  08/01/2018-  83 year old male former smoker followed for COPD/emphysema, pulmonary nodules, hypoxic respiratory failure, complicated by CAD O2 3 - 4L/[facility] Treated in October for Pseudomonas pneumonia LLL with Cipro. UTD flu/ pvax -----Follows for: COPD, pt reports feeling more SOB, he had pneumonia in October, another flare 2 weeks later, has affected his breathing more, but responded to levaquin. He had called with concerns about radiation exposure from anticipated repeat CT so we deferred to this visit. Pro-air HFA, neb DuoNeb, Spiriva Respimat, Symbicort 160 He had not found Trelegy helpful He still feels more dyspneic on exertion than before the pneumonia, but this has been a major complaint over the last several years anyway.  Little cough.  He remains oxygen dependent.  Denies chest pain or palpitation, fever or adenopathy.  We discussed the importance of following imaging, but respect appropriate concerns about minimizing cost and radiation associated with x-rays. CXR 06/13/2018-  IMPRESSION: Severe COPD. Right lower lobe infiltrate continues to  be present although better identified by CT. Continued radiographic or CT follow-up is recommended. Echocardiogram 07/31/2018-EF 55-60%, grade 1 DD, PHN 49 mmHg  Review of Systems-see HPI   + = positive Constitutional:   No-   weight loss, night sweats fevers, no-chills, fatigue, lassitude. HEENT:   No-  headaches, difficulty swallowing, tooth/dental problems, sore throat,       No-  sneezing, itching, ear ache, nasal congestion, post nasal drip,  CV:  No-chest pain, no-orthopnea, PND, No-swelling in lower extremities, anasarca, dizziness, palpitations Resp: + shortness of breath with exertion, not at rest.               cough,  + non-productive cough,  No-  coughing up of blood.              No change color of mucus.  No- wheezing.   Skin: No-   rash or lesions. GI:  No-   heartburn, indigestion, abdominal pain, nausea,   GU: No-   dysuria,  MS:  No-   joint pain or swelling.  Neuro-  Psych:  No- change in mood or affect. No acute depression or anxiety.  No memory loss.  OBJ- General- Alert, Oriented, Affect-appropriate, Distress- none acute. + O2 4L ,  Skin-  Lymphadenopathy- none Head- atraumatic            Eyes- Gross vision intact, PERRLA, conjunctivae clear secretions            Ears- Hearing, canals-normal            Nose- Clear, no-Septal dev, mucus, polyps, erosion, perforation             Throat- Mallampati II , mucosa clear , drainage- none, tonsils- atrophic.  Neck- flexible ,  trachea midline, no stridor , thyroid nl, carotid no bruit Chest - symmetrical excursion , unlabored           Heart/CV- RRR , no murmur , no gallop  , no rub, nl s1 s2                           - JVD- none , edema-none, stasis changes- none, varices- none           Lung-  Distant+, wheeze -none, unlabored, cough+light , dullness-none, rub- none           Chest wall-  Abd-  Br/ Gen/ Rectal- Not done, not indicated Extrem- cyanosis- none, clubbing, none, atrophy- none, strength- nl.  Neuro-  grossly intact to observation

## 2018-08-01 NOTE — Assessment & Plan Note (Signed)
I discussed expectations for recovery of stamina after pneumonia.  He is following the natural history and expected progression of his underlying COPD.  We are going to defer follow-up chest x-ray until his next visit.  He asks samples of Symbicort and Spiriva to help through the donut hole.

## 2018-08-03 ENCOUNTER — Encounter (HOSPITAL_COMMUNITY): Payer: Self-pay

## 2018-08-08 ENCOUNTER — Encounter (HOSPITAL_COMMUNITY): Payer: Self-pay

## 2018-08-10 ENCOUNTER — Encounter (HOSPITAL_COMMUNITY): Payer: Self-pay | Admitting: *Deleted

## 2018-08-10 ENCOUNTER — Telehealth: Payer: Self-pay | Admitting: Internal Medicine

## 2018-08-10 ENCOUNTER — Encounter (HOSPITAL_COMMUNITY): Payer: Self-pay

## 2018-08-10 DIAGNOSIS — J449 Chronic obstructive pulmonary disease, unspecified: Secondary | ICD-10-CM

## 2018-08-10 NOTE — Progress Notes (Signed)
We received an order for the Pulmonary Undergraduate program. I called [name] and discussed with him both Undergrad and Maintenance programs. He states that he would like to return to the Maintenance program. I have called to Dr. Janee Morn office to get the order changed to this.

## 2018-08-10 NOTE — Telephone Encounter (Signed)
Spoke with Lattie Haw she States recd order for [employer] has spoken with patient and he would like to try the maintenance program again, Order #[id number], and is requesting this order.   Dr. Annamaria Boots please advise if you are okay with this thank you.

## 2018-08-11 NOTE — Telephone Encounter (Signed)
Order has been placed for maintenance pulmonary rehab.  Spoke to [place] with pulmonary rehab and made her aware. Nothing further is needed.

## 2018-08-11 NOTE — Telephone Encounter (Signed)
Ok to change to maintenance application at pulmonary rehab

## 2018-08-13 ENCOUNTER — Other Ambulatory Visit: Payer: Self-pay | Admitting: Cardiovascular Disease

## 2018-08-15 ENCOUNTER — Encounter (HOSPITAL_COMMUNITY): Payer: Self-pay

## 2018-08-17 ENCOUNTER — Encounter (HOSPITAL_COMMUNITY): Payer: Self-pay

## 2018-08-18 DIAGNOSIS — K4090 Unilateral inguinal hernia, without obstruction or gangrene, not specified as recurrent: Secondary | ICD-10-CM | POA: Diagnosis not present

## 2018-08-18 DIAGNOSIS — R5383 Other fatigue: Secondary | ICD-10-CM | POA: Diagnosis not present

## 2018-08-18 DIAGNOSIS — K5900 Constipation, unspecified: Secondary | ICD-10-CM | POA: Diagnosis not present

## 2018-08-21 ENCOUNTER — Telehealth (HOSPITAL_COMMUNITY): Payer: Self-pay | Admitting: Family Medicine

## 2018-08-22 ENCOUNTER — Encounter (HOSPITAL_COMMUNITY): Payer: Self-pay

## 2018-08-22 ENCOUNTER — Inpatient Hospital Stay (HOSPITAL_COMMUNITY)
Admission: RE | Admit: 2018-08-22 | Discharge: 2018-08-22 | Disposition: A | Discharge status: Home / Self Care | Payer: Medicare Other | Source: Ambulatory Visit

## 2018-08-22 NOTE — Progress Notes (Signed)
[name] called out today not coming to the Pulmonary Maintenance program and ask for a return call. I called him and he said that he went to his PCP on Friday and found that he has a hernia. His PCP wants him to see a Psychologist, sport and exercise. I ask him to call me after he has the  evaluation. I will put the Pulmonary Maintenance program on hold until I hear from him.

## 2018-08-24 ENCOUNTER — Ambulatory Visit (HOSPITAL_COMMUNITY): Payer: Medicare Other

## 2018-08-24 ENCOUNTER — Encounter (HOSPITAL_COMMUNITY): Payer: Self-pay

## 2018-08-29 ENCOUNTER — Ambulatory Visit (HOSPITAL_COMMUNITY): Payer: Medicare Other

## 2018-08-29 ENCOUNTER — Encounter (HOSPITAL_COMMUNITY): Payer: Self-pay

## 2018-08-31 ENCOUNTER — Ambulatory Visit (HOSPITAL_COMMUNITY): Payer: Medicare Other

## 2018-08-31 ENCOUNTER — Encounter (HOSPITAL_COMMUNITY): Payer: Self-pay

## 2018-09-05 ENCOUNTER — Encounter (HOSPITAL_COMMUNITY): Payer: Self-pay

## 2018-09-05 ENCOUNTER — Ambulatory Visit (HOSPITAL_COMMUNITY): Payer: Medicare Other

## 2018-09-07 ENCOUNTER — Ambulatory Visit (HOSPITAL_COMMUNITY): Payer: Medicare Other

## 2018-09-07 ENCOUNTER — Encounter (HOSPITAL_COMMUNITY): Payer: Self-pay

## 2018-09-12 ENCOUNTER — Encounter (HOSPITAL_COMMUNITY): Payer: Self-pay

## 2018-09-12 ENCOUNTER — Ambulatory Visit (HOSPITAL_COMMUNITY): Payer: Medicare Other

## 2018-09-14 ENCOUNTER — Ambulatory Visit (HOSPITAL_COMMUNITY): Payer: Medicare Other

## 2018-09-14 ENCOUNTER — Encounter (HOSPITAL_COMMUNITY): Payer: Self-pay

## 2018-09-14 DIAGNOSIS — I10 Essential (primary) hypertension: Secondary | ICD-10-CM | POA: Diagnosis not present

## 2018-09-14 DIAGNOSIS — N400 Enlarged prostate without lower urinary tract symptoms: Secondary | ICD-10-CM | POA: Diagnosis not present

## 2018-09-14 DIAGNOSIS — Z9981 Dependence on supplemental oxygen: Secondary | ICD-10-CM | POA: Diagnosis not present

## 2018-09-14 DIAGNOSIS — J449 Chronic obstructive pulmonary disease, unspecified: Secondary | ICD-10-CM | POA: Diagnosis not present

## 2018-09-14 DIAGNOSIS — Z8679 Personal history of other diseases of the circulatory system: Secondary | ICD-10-CM | POA: Diagnosis not present

## 2018-09-14 DIAGNOSIS — K4090 Unilateral inguinal hernia, without obstruction or gangrene, not specified as recurrent: Secondary | ICD-10-CM | POA: Diagnosis not present

## 2018-09-18 DIAGNOSIS — M180 Bilateral primary osteoarthritis of first carpometacarpal joints: Secondary | ICD-10-CM | POA: Diagnosis not present

## 2018-09-19 ENCOUNTER — Ambulatory Visit (HOSPITAL_COMMUNITY): Payer: Medicare Other

## 2018-09-19 ENCOUNTER — Encounter (HOSPITAL_COMMUNITY): Payer: Self-pay

## 2018-09-21 ENCOUNTER — Ambulatory Visit (HOSPITAL_COMMUNITY): Payer: Medicare Other

## 2018-09-21 ENCOUNTER — Encounter (HOSPITAL_COMMUNITY): Payer: Self-pay

## 2018-09-26 ENCOUNTER — Ambulatory Visit (HOSPITAL_COMMUNITY): Payer: Medicare Other

## 2018-09-26 ENCOUNTER — Encounter (HOSPITAL_COMMUNITY): Payer: Self-pay

## 2018-09-28 ENCOUNTER — Ambulatory Visit (HOSPITAL_COMMUNITY): Payer: Medicare Other

## 2018-09-28 ENCOUNTER — Encounter (HOSPITAL_COMMUNITY): Payer: Self-pay

## 2018-10-03 ENCOUNTER — Encounter (HOSPITAL_COMMUNITY): Payer: Self-pay

## 2018-10-03 ENCOUNTER — Ambulatory Visit (HOSPITAL_COMMUNITY): Payer: Medicare Other

## 2018-10-05 ENCOUNTER — Ambulatory Visit (HOSPITAL_COMMUNITY): Payer: Medicare Other

## 2018-10-10 ENCOUNTER — Telehealth: Payer: Self-pay | Admitting: Internal Medicine

## 2018-10-10 ENCOUNTER — Ambulatory Visit (HOSPITAL_COMMUNITY): Payer: Medicare Other

## 2018-10-10 DIAGNOSIS — J449 Chronic obstructive pulmonary disease, unspecified: Secondary | ICD-10-CM | POA: Diagnosis not present

## 2018-10-10 DIAGNOSIS — I10 Essential (primary) hypertension: Secondary | ICD-10-CM | POA: Diagnosis not present

## 2018-10-10 DIAGNOSIS — Z9981 Dependence on supplemental oxygen: Secondary | ICD-10-CM | POA: Diagnosis not present

## 2018-10-10 DIAGNOSIS — N400 Enlarged prostate without lower urinary tract symptoms: Secondary | ICD-10-CM | POA: Diagnosis not present

## 2018-10-10 DIAGNOSIS — K4090 Unilateral inguinal hernia, without obstruction or gangrene, not specified as recurrent: Secondary | ICD-10-CM | POA: Diagnosis not present

## 2018-10-10 DIAGNOSIS — Z8679 Personal history of other diseases of the circulatory system: Secondary | ICD-10-CM | POA: Diagnosis not present

## 2018-10-10 NOTE — Telephone Encounter (Signed)
Sample x 2 Spiriva Respimat 2.5  Sample x 2 Symbicort 160  Order- Ok to resume Pulmonary Rehab- pneumonia has resolved    Order- future CXR in 4 months for next visit-   Dx COPD mixed type  Please call as needed    This order has been placed and detailed message left

## 2018-10-11 ENCOUNTER — Telehealth: Payer: Self-pay | Admitting: Internal Medicine

## 2018-10-11 ENCOUNTER — Telehealth: Payer: Self-pay | Admitting: *Deleted

## 2018-10-11 NOTE — Telephone Encounter (Signed)
Checked for surgical pulmonary clearance papers.  None was found in Dr Janee Morn box. Called and spoke with [facility]. Surgical clearance request placed in Dr Janee Morn box down stairs.

## 2018-10-11 NOTE — Telephone Encounter (Signed)
ADDENDUM: SURGEON WOULD LIKE RECOMMENDATIONS ON HOLDING ASA.

## 2018-10-11 NOTE — Telephone Encounter (Signed)
   Primary Cardiologist: Sherren Mocha, MD  Chart reviewed as part of pre-operative protocol coverage. Patient was contacted 10/11/2018 in reference to pre-operative risk assessment for pending surgery as outlined below.  [name] was last seen on 10/ 31/19 by Dr. Burt Knack. Attempted to reach pt by phone. LVM to call back. Will route to Dr. Burt Knack to see if ok to hold ASA.     [name], PA-C 10/11/2018, 9:30 AM

## 2018-10-11 NOTE — Telephone Encounter (Signed)
   Primary Cardiologist: Sherren Mocha, MD  Chart reviewed as part of pre-operative protocol coverage. Given past medical history and time since last visit, based on ACC/AHA guidelines, [name] would be at acceptable risk for the planned procedure without further cardiovascular testing.   Ok to hold ASA. Can hold 7 days prior and resume once safe to do so from a surgical standpoint.   I will route this recommendation to the requesting party via Epic fax function and remove from pre-op pool.  Please call with questions.  Lyda Jester, PA-C 10/11/2018, 10:44 AM

## 2018-10-11 NOTE — Telephone Encounter (Signed)
At low risk of holding aspirin. Ok to do this. thanks

## 2018-10-11 NOTE — Telephone Encounter (Signed)
   [facility] Pre-operative Risk Assessment    Request for surgical clearance:  1. What type of surgery is being performed? OPEN REPAIR OF INGUINAL HERNIA W/MESH   2. When is this surgery scheduled? TBD   3. What type of clearance is required (medical clearance vs. Pharmacy clearance to hold med vs. Both)? MEDICAL  4. Are there any medications that need to be held prior to surgery and how long?NONE LISTED THOUGH PT IS ON ASA   5. Practice name and name of physician performing surgery? [facility]; DR. Dalbert Batman   6. What is your office phone number [phone]    7.   What is your office fax number [phone]  8.   Anesthesia type (None, local, MAC, general) ?  SPINAL W/EXPAREL AND TAPP NERVE BLOCK   Julaine Hua 10/11/2018, 9:18 AM  _________________________________________________________________   (provider comments below)

## 2018-10-11 NOTE — Telephone Encounter (Signed)
   Primary Cardiologist: Sherren Mocha, MD  Chart reviewed as part of pre-operative protocol coverage. Patient was contacted 10/11/2018 in reference to pre-operative risk assessment for pending surgery as outlined below.  [name] was last seen on 06/15/18 by Dr. Copper.  Since that day, SHERWOOD CASTILLA has done well w/o any anginal symptoms. He denies CP. He will see his pulmonologist, Dr. Annamaria Boots, tomorrow for clearance as well (has COPD).   Therefore, based on ACC/AHA guidelines, the patient would be at acceptable risk, from a cardiac standpoint, for the planned procedure without further cardiovascular testing.   Awaiting Dr. York Cerise recs regarding ASA. Will route final clearance to requesting provider once Dr. Burt Knack weighs in.   Please call with questions.  Lyda Jester, PA-C 10/11/2018, 10:27 AM

## 2018-10-12 ENCOUNTER — Ambulatory Visit (INDEPENDENT_AMBULATORY_CARE_PROVIDER_SITE_OTHER): Payer: Medicare Other | Admitting: Internal Medicine

## 2018-10-12 ENCOUNTER — Ambulatory Visit (HOSPITAL_COMMUNITY): Payer: Medicare Other

## 2018-10-12 ENCOUNTER — Ambulatory Visit: Payer: Medicare Other | Admitting: Cardiovascular Disease

## 2018-10-12 ENCOUNTER — Ambulatory Visit (INDEPENDENT_AMBULATORY_CARE_PROVIDER_SITE_OTHER)
Admission: RE | Admit: 2018-10-12 | Discharge: 2018-10-12 | Disposition: A | Discharge status: Home / Self Care | Payer: Medicare Other | Source: Ambulatory Visit | Attending: Internal Medicine | Admitting: Internal Medicine

## 2018-10-12 ENCOUNTER — Encounter: Payer: Self-pay | Admitting: Internal Medicine

## 2018-10-12 VITALS — BP 140/80 | HR 97 | Ht 69.0 in | Wt 153.4 lb

## 2018-10-12 DIAGNOSIS — R911 Solitary pulmonary nodule: Secondary | ICD-10-CM | POA: Diagnosis not present

## 2018-10-12 DIAGNOSIS — J449 Chronic obstructive pulmonary disease, unspecified: Secondary | ICD-10-CM | POA: Diagnosis not present

## 2018-10-12 DIAGNOSIS — J9611 Chronic respiratory failure with hypoxia: Secondary | ICD-10-CM

## 2018-10-12 MED ORDER — IPRATROPIUM-ALBUTEROL 0.5-2.5 (3) MG/3ML IN SOLN: 3.0000 mL | Freq: Four times a day (QID) | RESPIRATORY_TRACT | 12 refills | Status: DC | PRN

## 2018-10-12 NOTE — Progress Notes (Signed)
Patient ID: [name], male    DOB: Sep 20, 1934, 84 y.o.   MRN: [id number] PCP Dr Hulan Fess  HPI male former smoker followed for COPD/emphysema, pulmonary nodules, hypoxic respiratory failure, complicated by CAD Y1OF 7/51/02-  MM PFT 04/27/11- ------------------------------------------------------------------------------ 08/01/2018-  84 year old male former smoker followed for COPD/emphysema, pulmonary nodules, hypoxic respiratory failure, complicated by CAD O2 3 - 4L/[facility] Treated in October for Pseudomonas pneumonia LLL with Cipro. UTD flu/ pvax -----Follows for: COPD, pt reports feeling more SOB, he had pneumonia in October, another flare 2 weeks later, has affected his breathing more, but responded to levaquin. He had called with concerns about radiation exposure from anticipated repeat CT so we deferred to this visit. Pro-air HFA, neb DuoNeb, Spiriva Respimat, Symbicort 160 He had not found Trelegy helpful He still feels more dyspneic on exertion than before the pneumonia, but this has been a major complaint over the last several years anyway.  Little cough.  He remains oxygen dependent.  Denies chest pain or palpitation, fever or adenopathy.  We discussed the importance of following imaging, but respect appropriate concerns about minimizing cost and radiation associated with x-rays. CXR 06/13/2018-  IMPRESSION: Severe COPD. Right lower lobe infiltrate continues to be present although better identified by CT. Continued radiographic or CT follow-up is recommended. Echocardiogram 07/31/2018-EF 55-60%, grade 1 DD, PHN 49 mmHg  10/12/2018- 84 year old male former smoker followed for COPD/emphysema, pulmonary nodules, hypoxic respiratory failure, complicated by CAD O2 3 - 4L/[facility] CXR 10/12/2018- Severe hyperinflation Labs from 05/23/18- BNP 109, Na 133. CO2 33, Hgb 13.2 Proair hfa, neb Duoneb, Spiriva, Symbicort 160,  Inguinal hernia developed after pneumonia with active  cough and constipation from antibiotics.  Now little cough but still more DOE than before pneumonia. Nebs little help. No benefit from [place], [employer], Trelegy. Denies chest pain, palpitation. Ankles do swell.   Review of Systems-see HPI   + = positive Constitutional:   No-   weight loss, night sweats fevers, no-chills, fatigue, lassitude. HEENT:   No-  headaches, difficulty swallowing, tooth/dental problems, sore throat,       No-  sneezing, itching, ear ache, nasal congestion, post nasal drip,  CV:  No-chest pain, no-orthopnea, PND, No-swelling in lower extremities, anasarca, dizziness, palpitations Resp: + shortness of breath with exertion, not at rest.               cough,  + non-productive cough,  No-  coughing up of blood.              No change color of mucus.  No- wheezing.   Skin: No-   rash or lesions. GI:  No-   heartburn, indigestion, abdominal pain, nausea,   GU: No-   dysuria,  MS:  No-   joint pain or swelling.  Neuro-  Psych:  No- change in mood or affect. No acute depression or anxiety.  No memory loss.  OBJ- General- Alert, Oriented, Affect-appropriate, Distress- none acute. + O2 4L ,  Skin-  Lymphadenopathy- none Head- atraumatic            Eyes- Gross vision intact, PERRLA, conjunctivae clear secretions            Ears- Hearing, canals-normal            Nose- Clear, no-Septal dev, mucus, polyps, erosion, perforation             Throat- Mallampati II , mucosa clear , drainage- none, tonsils- atrophic.  Neck- flexible , trachea midline,  no stridor , thyroid nl, carotid no bruit Chest - symmetrical excursion , unlabored           Heart/CV- RRR , no murmur , no gallop  , no rub, nl s1 s2                           - JVD- none , edema+2-3, stasis changes- none, varices- none           Lung-  Distant+, wheeze -none, unlabored, cough-none, dullness-none, rub- none           Chest wall-  Abd-  Br/ Gen/ Rectal- Not done, not indicated Extrem- cyanosis- none, clubbing,  none, atrophy- none, strength- nl.  Neuro- grossly intact to observation

## 2018-10-12 NOTE — Telephone Encounter (Signed)
We do have the clearance forms that is needed for pt's surgery. Dr. Annamaria Boots is already aware of it. As soon as pt has the OV and forms are filled out, we will fax them back to Dr. Darrel Hoover office for pt.  [facility] and spoke with Kennyth Lose letting her know that we did have the forms and after pt is seen by CY, forms will be faxed back to their office. Kennyth Lose expressed understanding. Nothing further needed.

## 2018-10-12 NOTE — Patient Instructions (Addendum)
Order- [place]- please refill neb solution  Continue O2 3-4 L, 24/ 7  Please call as needed   Ok to keep April appointment

## 2018-10-12 NOTE — Telephone Encounter (Signed)
Raquel Sarna was his nurse yesterday. Raquel Sarna please advise if you have the forms to fill out. Thank you.

## 2018-10-13 ENCOUNTER — Encounter: Payer: Self-pay | Admitting: Internal Medicine

## 2018-10-13 NOTE — Assessment & Plan Note (Signed)
Continue to watch small nodules, especially RUL.  These have been acting benign over several years of surveillance.

## 2018-10-13 NOTE — Assessment & Plan Note (Signed)
He remains dependent on O2, using 4L for routine activity.

## 2018-10-13 NOTE — Assessment & Plan Note (Signed)
He has clinically recovered from pneumonia. Almost pure emphysema, with little response to bronchodilators. I think he will tolerate necessary hernia repair with spinal and local blocks, but should avoid GOT and will be at increased risk due to weakness and severe lung disease.

## 2018-10-17 ENCOUNTER — Telehealth: Payer: Self-pay | Admitting: Internal Medicine

## 2018-10-17 ENCOUNTER — Ambulatory Visit (HOSPITAL_COMMUNITY): Payer: Medicare Other

## 2018-10-17 NOTE — Telephone Encounter (Signed)
Called [place] [facility], office is closed. Will call back.

## 2018-10-18 NOTE — Telephone Encounter (Signed)
[facility]. Office is currently closed. Will call back.

## 2018-10-19 ENCOUNTER — Ambulatory Visit (HOSPITAL_COMMUNITY): Payer: Medicare Other

## 2018-10-19 NOTE — Telephone Encounter (Signed)
[name] states they have received clearance no need to call back unless something has changed//kob

## 2018-10-19 NOTE — Telephone Encounter (Signed)
Called and spoke with [name] she stated that [name] was in a room with a patient and she would have her call us back.

## 2018-10-20 NOTE — Telephone Encounter (Signed)
Noted.  Will close message. 

## 2018-10-21 ENCOUNTER — Other Ambulatory Visit: Payer: Self-pay | Admitting: General Surgery

## 2018-10-24 ENCOUNTER — Ambulatory Visit (HOSPITAL_COMMUNITY): Payer: Medicare Other

## 2018-10-24 ENCOUNTER — Other Ambulatory Visit (HOSPITAL_COMMUNITY): Payer: Medicare Other

## 2018-10-24 NOTE — Pre-Procedure Instructions (Addendum)
[name]  10/24/2018      [place], [address] [facility] [address] [place] [address] Phone: [phone] Fax: [phone]    Your procedure is scheduled on November 03, 2018.  Report to [facility] Entrance "A" at 530 AM.  Call this number if you have problems the morning of surgery:  [phone]   Remember:  Do not eat after midnight. You may drink clear liquids until 430 AM.  Clear liquids allowed are:     Water, Juice (non-citric and without pulp), Carbonated beverages, Clear Tea, Black Coffee only and Gatorade      Take these medicines the morning of surgery with A SIP OF WATER  digoxin (LANOXIN) SPIRIVA RESPIMAT 2.5 MCG/ACT  Symbicort Inhaler Tamsulosin (flomax)  IF NEEDED: albuterol (PROAIR HFA) 108 (90 BASE) MCG/ACT inhaler-bring with you ipratropium-albuterol (DUONEB) 0.5-2.5 (3) MG/3ML SOLN-bring with you  Follow your surgeon's instructions on when to hold/resume aspirin.  If no instructions were given call the office to determine how they would like to you take aspirin  7 days prior to surgery STOP taking any NSAIDS: Aleve, Naproxen, Ibuprofen, Motrin, Advil, Goody's, BC's, all herbal medications, fish oil, and all vitamins    Do not wear jewelry  Do not wear lotions, powders, or colognes, or deodorant.  Men may shave face and neck.  Do not bring valuables to the hospital.  [facility] is not responsible for any belongings or valuables.  Contacts, dentures or bridgework may not be worn into surgery.  Leave your suitcase in the car.  After surgery it may be brought to your room.  For patients admitted to the hospital, discharge time will be determined by your treatment team.  Patients discharged the day of surgery will not be allowed to drive home.    [place]- Preparing For Surgery  Before surgery, you can play an important role. Because skin is not sterile, your skin  needs to be as free of germs as possible. You can reduce the number of germs on your skin by washing with CHG (chlorahexidine gluconate) Soap before surgery.  CHG is an antiseptic cleaner which kills germs and bonds with the skin to continue killing germs even after washing.    Oral Hygiene is also important to reduce your risk of infection.  Remember - BRUSH YOUR TEETH THE MORNING OF SURGERY WITH YOUR REGULAR TOOTHPASTE  Please do not use if you have an allergy to CHG or antibacterial soaps. If your skin becomes reddened/irritated stop using the CHG.  Do not shave (including legs and underarms) for at least 48 hours prior to first CHG shower. It is OK to shave your face.  Please follow these instructions carefully.   1. Shower the NIGHT BEFORE SURGERY and the MORNING OF SURGERY with CHG.   2. If you chose to wash your hair, wash your hair first as usual with your normal shampoo.  3. After you shampoo, rinse your hair and body thoroughly to remove the shampoo.  4. Use CHG as you would any other liquid soap. You can apply CHG directly to the skin and wash gently with a scrungie or a clean washcloth.   5. Apply the CHG Soap to your body ONLY FROM THE NECK DOWN.  Do not use on open wounds or open sores. Avoid contact with your eyes, ears, mouth and genitals (private parts). Wash Face and genitals (  private parts)  with your normal soap.  6. Wash thoroughly, paying special attention to the area where your surgery will be performed.  7. Thoroughly rinse your body with warm water from the neck down.  8. DO NOT shower/wash with your normal soap after using and rinsing off the CHG Soap.  9. Pat yourself dry with a CLEAN TOWEL.  10. Wear CLEAN PAJAMAS to bed the night before surgery, wear comfortable clothes the morning of surgery  11. Place CLEAN SHEETS on your bed the night of your first shower and DO NOT SLEEP WITH PETS.  Day of Surgery:  Do not apply any deodorants/lotions.  Please wear  clean clothes to the hospital/surgery center.   Remember to brush your teeth WITH YOUR REGULAR TOOTHPASTE.   Please read over the following fact sheets that you were given.

## 2018-10-25 ENCOUNTER — Other Ambulatory Visit: Payer: Self-pay

## 2018-10-25 ENCOUNTER — Encounter (HOSPITAL_COMMUNITY)
Admission: RE | Admit: 2018-10-25 | Discharge: 2018-10-25 | Disposition: A | Discharge status: Home / Self Care | Payer: Medicare Other | Source: Ambulatory Visit | Attending: General Surgery | Admitting: General Surgery

## 2018-10-25 ENCOUNTER — Encounter (HOSPITAL_COMMUNITY): Payer: Self-pay

## 2018-10-25 ENCOUNTER — Encounter (HOSPITAL_COMMUNITY): Payer: Self-pay | Admitting: Physician Assistant

## 2018-10-25 DIAGNOSIS — K4090 Unilateral inguinal hernia, without obstruction or gangrene, not specified as recurrent: Secondary | ICD-10-CM | POA: Insufficient documentation

## 2018-10-25 DIAGNOSIS — Z7982 Long term (current) use of aspirin: Secondary | ICD-10-CM | POA: Insufficient documentation

## 2018-10-25 DIAGNOSIS — Z9981 Dependence on supplemental oxygen: Secondary | ICD-10-CM | POA: Insufficient documentation

## 2018-10-25 DIAGNOSIS — G629 Polyneuropathy, unspecified: Secondary | ICD-10-CM | POA: Diagnosis not present

## 2018-10-25 DIAGNOSIS — Z01812 Encounter for preprocedural laboratory examination: Secondary | ICD-10-CM | POA: Diagnosis not present

## 2018-10-25 DIAGNOSIS — J9611 Chronic respiratory failure with hypoxia: Secondary | ICD-10-CM | POA: Diagnosis not present

## 2018-10-25 DIAGNOSIS — J449 Chronic obstructive pulmonary disease, unspecified: Secondary | ICD-10-CM | POA: Insufficient documentation

## 2018-10-25 DIAGNOSIS — Z79899 Other long term (current) drug therapy: Secondary | ICD-10-CM | POA: Insufficient documentation

## 2018-10-25 DIAGNOSIS — M1990 Unspecified osteoarthritis, unspecified site: Secondary | ICD-10-CM | POA: Diagnosis not present

## 2018-10-25 DIAGNOSIS — I10 Essential (primary) hypertension: Secondary | ICD-10-CM | POA: Diagnosis not present

## 2018-10-25 HISTORY — DX: Myoneural disorder, unspecified: G70.9

## 2018-10-25 HISTORY — DX: Dyspnea, unspecified: R06.00

## 2018-10-25 HISTORY — DX: Essential (primary) hypertension: I10

## 2018-10-25 HISTORY — DX: Unspecified osteoarthritis, unspecified site: M19.90

## 2018-10-25 HISTORY — DX: Cardiac arrhythmia, unspecified: I49.9

## 2018-10-25 HISTORY — DX: Polyneuropathy, unspecified: G62.9

## 2018-10-25 HISTORY — DX: Pneumonia, unspecified organism: J18.9

## 2018-10-25 LAB — COMPREHENSIVE METABOLIC PANEL
ALT: 16 U/L (ref 0–44)
AST: 19 U/L (ref 15–41)
Albumin: 4 g/dL (ref 3.5–5.0)
Alkaline Phosphatase: 43 U/L (ref 38–126)
Anion gap: 9 (ref 5–15)
BUN: 14 mg/dL (ref 8–23)
CO2: 28 mmol/L (ref 22–32)
Calcium: 9.3 mg/dL (ref 8.9–10.3)
Chloride: 100 mmol/L (ref 98–111)
Creatinine, Ser: 0.57 mg/dL — ABNORMAL LOW (ref 0.61–1.24)
GFR calc Af Amer: >60 mL/min (ref >60)
GFR calc non Af Amer: >60 mL/min (ref >60)
Glucose, Bld: 101 mg/dL — ABNORMAL HIGH (ref 70–99)
Potassium: 4.5 mmol/L (ref 3.5–5.1)
Sodium: 137 mmol/L (ref 135–145)
Total Bilirubin: 0.7 mg/dL (ref 0.3–1.2)
Total Protein: 6.5 g/dL (ref 6.5–8.1)

## 2018-10-25 LAB — CBC WITH DIFFERENTIAL/PLATELET
Abs Immature Granulocytes: 0.03 10*3/uL (ref 0.00–0.07)
Basophils Absolute: 0 10*3/uL (ref 0.0–0.1)
Basophils Relative: 1 %
Eosinophils Absolute: 0.1 10*3/uL (ref 0.0–0.5)
Eosinophils Relative: 1 %
HCT: 43.6 % (ref 39.0–52.0)
Hemoglobin: 13.3 g/dL (ref 13.0–17.0)
Immature Granulocytes: 0 %
Lymphocytes Relative: 9 %
Lymphs Abs: 0.7 10*3/uL (ref 0.7–4.0)
MCH: 27.5 pg (ref 26.0–34.0)
MCHC: 30.5 g/dL (ref 30.0–36.0)
MCV: 90.1 fL (ref 80.0–100.0)
Monocytes Absolute: 0.5 10*3/uL (ref 0.1–1.0)
Monocytes Relative: 6 %
Neutro Abs: 6.5 10*3/uL (ref 1.7–7.7)
Neutrophils Relative %: 83 %
Platelets: 198 10*3/uL (ref 150–400)
RBC: 4.84 MIL/uL (ref 4.22–5.81)
RDW: 13.6 % (ref 11.5–15.5)
WBC: 7.9 10*3/uL (ref 4.0–10.5)
nRBC: 0 % (ref 0.0–0.2)

## 2018-10-25 NOTE — Pre-Procedure Instructions (Signed)
[name]  10/25/2018      Your procedure is scheduled on November 03, 2018.  Report to [facility] Entrance "A" at 5:30 AM.  Call this number if you have problems the morning of surgery:  [phone]   Remember:  Do not eat after midnight. You may drink clear liquids until 4:30 AM.  Clear liquids allowed are:   Water, Juice (non-citric and without pulp), Carbonated beverages, Clear Tea, Black Coffee only and Gatorade      Take these medicines the morning of surgery with A SIP OF WATER  digoxin (LANOXIN) SPIRIVA RESPIMAT 2.5 MCG/ACT  Symbicort Inhaler Tamsulosin (flomax)  IF NEEDED: albuterol (PROAIR HFA) 108 (90 BASE) MCG/ACT inhaler- Bring with you ipratropium-albuterol (DUONEB) 0.5-2.5 (3) MG/3ML SOLN- Bring with you  HOLD your Aspirin for 7 days prior to surgery per your Doctor.  7 days prior to surgery STOP taking any NSAIDS: Aleve, Naproxen, Ibuprofen, Motrin, Advil, Goody's, BC's, all herbal medications, fish oil, and all vitamins.    Do not wear jewelry  Do not wear lotions, powders, or colognes, or deodorant.  Men may shave face and neck.  Do not bring valuables to the hospital.  [facility] is not responsible for any belongings or valuables.  Contacts, dentures or bridgework may not be worn into surgery.  Leave your suitcase in the car.  After surgery it may be brought to your room.  For patients admitted to the hospital, discharge time will be determined by your treatment team.  Patients discharged the day of surgery will not be allowed to drive home.    [place]- Preparing For Surgery  Before surgery, you can play an important role. Because skin is not sterile, your skin needs to be as free of germs as possible. You can reduce the number of germs on your skin by washing with CHG (chlorahexidine gluconate) Soap before surgery.  CHG is an antiseptic cleaner which kills germs and bonds with the skin to continue killing germs even after washing.    Oral  Hygiene is also important to reduce your risk of infection.  Remember - BRUSH YOUR TEETH THE MORNING OF SURGERY WITH YOUR REGULAR TOOTHPASTE  Please do not use if you have an allergy to CHG or antibacterial soaps. If your skin becomes reddened/irritated stop using the CHG.  Do not shave (including legs and underarms) for at least 48 hours prior to first CHG shower. It is OK to shave your face.  Please follow these instructions carefully.   1. Shower the NIGHT BEFORE SURGERY and the MORNING OF SURGERY with CHG.   2. If you chose to wash your hair, wash your hair first as usual with your normal shampoo.  3. After you shampoo, rinse your hair and body thoroughly to remove the shampoo.  4. Use CHG as you would any other liquid soap. You can apply CHG directly to the skin and wash gently with a scrungie or a clean washcloth.   5. Apply the CHG Soap to your body ONLY FROM THE NECK DOWN.  Do not use on open wounds or open sores. Avoid contact with your eyes, ears, mouth and genitals (private parts). Wash Face and genitals (private parts)  with your normal soap.  6. Wash thoroughly, paying special attention to the area where your surgery will be performed.  7. Thoroughly rinse your body with warm water from the neck down.  8. DO NOT shower/wash with your normal soap after using and rinsing off  the CHG Soap.  9. Pat yourself dry with a CLEAN TOWEL.  10. Wear CLEAN PAJAMAS to bed the night before surgery, wear comfortable clothes the morning of surgery  11. Place CLEAN SHEETS on your bed the night of your first shower and DO NOT SLEEP WITH PETS.  Day of Surgery:  Do not apply any deodorants/lotions.  Please wear clean clothes to the hospital/surgery center.   Remember to brush your teeth WITH YOUR REGULAR TOOTHPASTE.   Please read over the following fact sheets that you were given.

## 2018-10-25 NOTE — Progress Notes (Addendum)
PCP: Dr. Rex Kras Cardiologist: Dr. Cooper--10/11/18 clearance Pulmonologist: Dr. Garfield Cornea and recommendations under "Letter" tab  EKG: 05/28/2018 CXR: 10/12/2018 ECHO: 07/2018 Stress Test: denies Cardiac Cath: 2003  Pt plans to have last dose ASA on 10/26/18.  Pt on 3-5 L home oxygen.  Reports that he has not been breathing as well as he had been since recovering from PNA in Oct 2019--Dr. Young aware of this.  Baldwin Crown with anesthesia to see during surgery.   Patient denies  fever, cough, and chest pain at PAT appointment.  Pt anxious at PAT appt regarding surgery and anesthesia to be used during surgery, emotional support provided.   Patient verbalized understanding of instructions provided today at the PAT appointment.  Patient asked to review instructions at home and day of surgery.

## 2018-10-26 ENCOUNTER — Ambulatory Visit (HOSPITAL_COMMUNITY): Payer: Medicare Other

## 2018-10-26 NOTE — Anesthesia Preprocedure Evaluation (Deleted)
Anesthesia Evaluation    Airway        Dental   Pulmonary former smoker,           Cardiovascular hypertension,      Neuro/Psych    GI/Hepatic   Endo/Other    Renal/GU      Musculoskeletal   Abdominal   Peds  Hematology   Anesthesia Other Findings   Reproductive/Obstetrics                             Anesthesia Physical Anesthesia Plan  ASA:   Anesthesia Plan:    Post-op Pain Management:    Induction:   PONV Risk Score and Plan:   Airway Management Planned:   Additional Equipment:   Intra-op Plan:   Post-operative Plan:   Informed Consent:   Plan Discussed with:   Anesthesia Plan Comments: (See PAT note by Ulises Luisalberto Beegle, PA-C )        Anesthesia Quick Evaluation  

## 2018-10-26 NOTE — Progress Notes (Signed)
Anesthesia Chart Review:  Case:  [id number] Date/Time:  11/03/18 0715   Procedure:  OPEN REPAIR LEFT INGUINAL HERNIA WITH MESH (Left ) - SPINAL AND TAP BLOCK ANESTHESIA   Anesthesia type:  Spinal   Pre-op diagnosis:  LEFT INGUINAL HERNIA   Location:  [facility] OR ROOM 08 / [place] [facility]   Surgeon:  Fanny Skates, MD      DISCUSSION: 84 yo male former smoker. Pertinent hx includes COPD O2 dependent, Peripheral neuropathy, Dyspnea, HTN.   Pt follows with cardiology for CAD noted on on CT imaging. Last seen by Dr. Burt Knack 06/15/18, pt stable at that time. Echo 07/31/2018 showed EF 55-60%, normal wall motion, grade 1 dd, moderate TR, moderate pulm HTN. Cardiac clearance per telephone encounter 10/11/18 by Lyda Jester, PA-C.   Pt follows with pulmonology, Dr. Annamaria Boots, for COPD, chronic respiratory failure with hypoxia. He is oxygen dependent, using 4L O2 for routine activity. He had PNA in October 2019 and had a prolonged recovery. He was seen by Dr. Annamaria Boots 10/13/18 for preop clearance. Per his note "He has clinically recovered from pneumonia. Almost pure emphysema, with little response to bronchodilators. I think he will tolerate necessary hernia repair with spinal and local blocks, but should avoid GOT and will be at increased risk due to weakness and severe lung disease"  Dr. Annamaria Boots also sent a letter to Dr. Dalbert Batman stating: "I have been asked to write you concerning inguinal hernia surgery for my long-time patient [name]. He has very severe emphysema and is oxygen dependent on 3-5 L for routine ADLs.  He has recovered from a pneumonia earlier this year, and is slowly regaining strength. CXR is now clear of active process but does show his emphysema with over-inflation.   I expect Mr Paradis to tolerate necessary surgery, but GOT anesthesia should be avoided. He will be sensitive to oversedation with CO2 retention, to fluid overload, and to reflux with aspiration.   As I told him, it would be better to  go ahead now with a planned procedure, rather than to face an urgent surgery at a less opportune time, should his hernia become complicated.  My team will be available for consultation if needed."  I spoke with the pt at his PAT appt. He feels he is at his baseline. He is wearing O2 as prescribed, does not feel dyspneic at rest. He does have chronic baseline DOE. We discussed anesthesia concerns, he understands the plan per Dr. Annamaria Boots. I advised that there is a very low, but non-zero chance of requiring intubation and that if this occurred he may need to remain ventilated postop and weaned as tolerated. He expressed understanding.  Anticipate he can proceed as planned barring acute status change.   VS: BP (!) 135/55   Pulse 73   Temp 36.7 C   Resp 20   Ht 5\' 8"  (1.727 m)   Wt 68.6 kg   SpO2 100%   BMI 23.01 kg/m   PROVIDERS: Hulan Fess, MD is PCP  Baird Lyons, MD is Pulmonologist  Sherren Mocha, MD is Cardiologsit  LABS: Labs reviewed: Acceptable for surgery. (all labs ordered are listed, but only abnormal results are displayed)  Labs Reviewed  COMPREHENSIVE METABOLIC PANEL - Abnormal; Notable for the following components:      Result Value   Glucose, Bld 101 (*)    Creatinine, Ser 0.57 (*)    All other components within normal limits  CBC WITH DIFFERENTIAL/PLATELET     IMAGES: CHEST - 2  VIEW 10/12/2018  COMPARISON:  06/13/2018  FINDINGS: Cardiac shadows within normal limits. The lungs are again hyperinflated consistent with COPD. No focal infiltrate or sizable effusion is noted. Previously seen infiltrate in the lower base has resolved in the interval. No acute bony abnormality is noted.  IMPRESSION: COPD without acute abnormality.  EKG: 06/15/2018: NSR. Rate 92. Rightward axis. Pulmonary disease pattern.  CV: TTE 07/31/2018: Study Conclusions  - Left ventricle: The cavity size was normal. Systolic function was   normal. The estimated ejection  fraction was in the range of 55%   to 60%. Wall motion was normal; there were no regional wall   motion abnormalities. Doppler parameters are consistent with   abnormal left ventricular relaxation (grade 1 diastolic   dysfunction). - Aortic valve: Trileaflet; mildly thickened, mildly calcified   leaflets. Transvalvular velocity was within the normal range.   There was no stenosis. - Right ventricle: The cavity size was mildly dilated. Wall   thickness was normal. Systolic function was normal. - Right atrium: The atrium was moderately dilated. - Tricuspid valve: There was moderate regurgitation. - Pulmonic valve: There was trivial regurgitation. - Pulmonary arteries: Systolic pressure was moderately increased.   PA peak pressure: 49 mm Hg (S). - Inferior vena cava: The vessel was normal in size.  Past Medical History:  Diagnosis Date  . Allergic rhinitis, cause unspecified   . Arthritis   . COPD (chronic obstructive pulmonary disease) ([place])   . Dyspnea   . Dysrhythmia    "1 episode of tachycardia in 1980's, been on it ever since"  . Emphysema of lung ([place])   . Hypertension   . Neuromuscular disorder ([place])   . Neuropathy    feet  . Pneumonia    History of, last Oct 2019  . Pulmonary nodule     Past Surgical History:  Procedure Laterality Date  . APPENDECTOMY    . CARDIAC CATHETERIZATION  2003   performed at [facility], Dr. Fransico Him  . CATARACT EXTRACTION, BILATERAL    . EYE SURGERY    . ROTATOR CUFF REPAIR    . TONSILLECTOMY    . VASECTOMY      MEDICATIONS: . albuterol (PROAIR HFA) 108 (90 BASE) MCG/ACT inhaler  . aspirin EC 81 MG tablet  . digoxin (LANOXIN) 0.25 MG tablet  . ipratropium-albuterol (DUONEB) 0.5-2.5 (3) MG/3ML SOLN  . losartan (COZAAR) 25 MG tablet  . Nutritional Supplements (ENSURE HIGH PROTEIN) LIQD  . OXYGEN  . polyethylene glycol powder (GLYCOLAX/MIRALAX) powder  . pravastatin (PRAVACHOL) 40 MG tablet  . Respiratory Therapy Supplies (FLUTTER) DEVI   . SPIRIVA RESPIMAT 2.5 MCG/ACT AERS  . SYMBICORT 160-4.5 MCG/ACT inhaler  . tamsulosin (FLOMAX) 0.4 MG CAPS capsule   No current facility-administered medications for this encounter.     Shivam, [facility] Short Stay Center/Anesthesiology Phone [phone] 10/26/2018 3:52 PM

## 2018-10-29 NOTE — H&P (Signed)
[name] Location: [facility] Surgery Patient #: [id number] DOB: 1935/06/18 Married / Language: English / Race: White Male       History of Present Illness     . This is an 84 year old gentleman who returns 1 month after initial evaluation with progressive symptoms related to his left inguinal hernia. He is a member of[employer]. His PCP is Lennette Bihari Little. Baird Lyons is his pulmonologist. He sees Sherren Mocha because of coronary disease and remote history of paroxysmal atrial tachycardia. He has oxygen-dependent COPD     He noticed a left inguinal hernia in October last year after coughing a lot when he was treated for pneumonia. He is on antibiotics for 1 month and constipated. Noticed a bulge but really wasn't having any pain I saw him 1 month ago and we decided to hold off because his symptoms were minimal and the hernia was soft and easily reducible Since that time he is having increasing discomfort when he coughs or moves and he's felt a popping sensation sometimes. When he is in bed at night he doesn't have any symptoms denies nausea or vomiting       Morbidities include oxygen dependent COPD. 3 L of oxygen for 12 years. Not dyspneic at rest but only walks a few feet before he gets short of breath. Stress his own car. Hypertension. BMI 23. BPH. History PAT in the 1980s. Takes an aspirin. Open appendectomy tonsillectomy. No anesthesia over 12 years      When I examined him today his abdomen was soft but there was a hernia bulge in his left groin sizable large egg. I could not reduce to standing. I had to forcibly pop it back in when he was supine and he was a little bit tender when I did that. I think he is heading for an incarceration or strangulation. I told him I wanted to consider open repair of left inguinal hernia with mesh under spinal anesthesia with exparel and TAPP nerve block and overnight observation. He agrees We will schedule  surgery after he undergoes formal risk assessment with Dr. Baird Lyons and Dr. Sherren Mocha  Posting sheet was completed including anesthesia request.    Allergies Tolectin *ANALGESICS - ANTI-INFLAMMATORY*  Augmentin *PENICILLINS*  Neosporin *OPHTHALMIC AGENTS*  Allergies Reconciled   Medication History  Aspirin (81MG  Tablet, Oral) Active. Losartan Potassium (50MG  Tablet, Oral) Active. Nitroglycerin (0.4MG  Tab Sublingual, Sublingual) Active. ProAir HFA (108 (90 Base)MCG/ACT Aerosol Soln, Inhalation) Active. Spiriva Respimat (2.5MCG/ACT Aerosol Soln, Inhalation) Active. Digoxin-Tab (0.25MG  Tablet, Oral) Active. Pravastatin Sodium (40MG  Tablet, Oral) Active. Tamsulosin HCl (0.4MG  Capsule, Oral) Active. Symbicort (160-4.5MCG/ACT Aerosol, Inhalation) Active. Medications Reconciled  Vitals Weight: 155 lb Height: 68in Body Surface Area: 1.83 m Body Mass Index: 23.57 kg/m  Temp.: 97.59F(Temporal)  Peak Flow: 3L/min BP: 132/60 (Sitting, Left Arm, Standard)     Physical Exam  General Mental Status-Alert. General Appearance-Not in acute distress. Build & Nutrition-Well nourished. Posture-Normal posture. Gait-Normal.  Head and Neck Head-normocephalic, atraumatic with no lesions or palpable masses. Trachea-midline. Thyroid Gland Characteristics - normal size and consistency and no palpable nodules.  Chest and Lung Exam Chest and lung exam reveals -on auscultation, normal breath sounds, no adventitious sounds and normal vocal resonance. Note: Breath sounds distant. No rhonchi or wheeze. He does use accessory muscles at rest.   Cardiovascular Cardiovascular examination reveals -normal heart sounds, regular rate and rhythm with no murmurs and femoral artery auscultation bilaterally reveals normal pulses, no bruits, no thrills.  Abdomen  Inspection Inspection of the abdomen reveals - No  Hernias. Palpation/Percussion Palpation and Percussion of the abdomen reveal - Soft, Non Tender, No Rigidity (guarding), No hepatosplenomegaly and No Palpable abdominal masses.  Male Genitourinary Note: Left inguinal hernia bulge size of an egg. Could not reduce it standing. Supine I was able to pop it back in but he was tender. No scrotal mass.   Neurologic Neurologic evaluation reveals -alert and oriented x 3 with no impairment of recent or remote memory, normal attention span and ability to concentrate, normal sensation and normal coordination.  Musculoskeletal Normal Exam - Bilateral-Upper Extremity Strength Normal and Lower Extremity Strength Normal.    Assessment & Plan  LEFT INGUINAL HERNIA (K40.90)       Your left inguinal hernia is becoming more of a problem You are now having more pain and a popping sensation I had to forcibly reduce the hernia today This has progressed since I saw you one month ago      I think that you are now developing a greater risk for incarceration and strangulation, which would be associated with a high complication rate considering your lung disease and heart disease Although high risk, it would be safer to operate on you electively following cardiac and pulmonary risk assessment We talked about this for a long time and you agree      We will ask Dr. Sherren Mocha and Dr. Keturah Barre to expedite risk assessment and clearance for surgery Assuming that anesthesia agrees, we would proceed with open repair of left inguinal hernia with mesh under spinal anesthesia and a nerve block you would be admitted to the hospital overnight to make sure that sure life status remained stable The surgery would be done at [facility]   COPD, SEVERE (J44.9) OXYGEN DEPENDENT (Z99.81) HISTORY OF PAT (PAROXYSMAL ATRIAL TACHYCARDIA) (Z86.79) BPH (BENIGN PROSTATIC HYPERPLASIA) (N40.0) HYPERTENSION, BENIGN (I10)    [name] [name], M.D., [facility] General and Minimally invasive Surgery Breast and Colorectal Surgery Office:   [phone] Pager:   [phone]

## 2018-10-30 NOTE — Telephone Encounter (Signed)
There is no guarantee. But as we discussed in the office, I would favor going ahead now, because surgery might be more urgent in the future at some time when you are less well and you can't put it off.

## 2018-10-30 NOTE — Telephone Encounter (Signed)
  Dr Annamaria Boots, this message was received this morning.  My hernia surgery is scheduled for this Friday morning at [facility] with an overnight stay there.Based upon your current knowledge of the heath care emergency and my high risk condition, would you recommend having it done or delaying to a future date  Dr Annamaria Boots, please advise  Allergies  Allergen Reactions  . Tolectin [Tolmetin Sodium] Other (See Comments)    BP low and passed out  . Augmentin [Amoxicillin-Pot Clavulanate] Nausea And Vomiting    Did it involve swelling of the face/tongue/throat, SOB, or low BP? No Did it involve sudden or severe rash/hives, skin peeling, or any reaction on the inside of your mouth or nose? No Did you need to seek medical attention at a hospital or doctor's office? No When did it last happen?Last year If all above answers are "NO", may proceed with cephalosporin use.  . Montelukast Sodium Other (See Comments)    Unknown rxn   Current Outpatient Medications on File Prior to Visit  Medication Sig Dispense Refill  . albuterol (PROAIR HFA) 108 (90 BASE) MCG/ACT inhaler Inhale 2 puffs into the lungs 4 (four) times daily as needed for wheezing or shortness of breath. 1 Inhaler 5  . aspirin EC 81 MG tablet Take 1 tablet (81 mg total) by mouth daily.    . digoxin (LANOXIN) 0.25 MG tablet Take 0.25 mg by mouth daily.     [name] ipratropium-albuterol (DUONEB) 0.5-2.5 (3) MG/3ML SOLN Take 3 mLs by nebulization every 6 (six) hours as needed. (Patient taking differently: Take 3 mLs by nebulization every 6 (six) hours as needed (shortness of breath). ) 75 mL 12  . losartan (COZAAR) 25 MG tablet TAKE 1 TABLET(25 MG) BY MOUTH DAILY (Patient taking differently: Take 25 mg by mouth daily. ) 90 tablet 2  . Nutritional Supplements (ENSURE HIGH PROTEIN) LIQD Take 1 Bottle by mouth daily.    . OXYGEN Inhale 3-5 L into the lungs continuous.     . polyethylene glycol powder (GLYCOLAX/MIRALAX) powder Take 17 g by mouth daily.     . pravastatin (PRAVACHOL) 40 MG tablet Take 40 mg by mouth daily.     [name] Respiratory Therapy Supplies (FLUTTER) DEVI Use as directed 1 each 0  . SPIRIVA RESPIMAT 2.5 MCG/ACT AERS INHALE 2 PUFFS BY MOUTH EVERY DAY (Patient taking differently: Inhale 2 puffs into the lungs daily. ) 4 g 11  . SYMBICORT 160-4.5 MCG/ACT inhaler INHALE 2 PUFFS BY MOUTH TWICE DAILY. RINSE MOUTH (Patient taking differently: Inhale 2 puffs into the lungs 2 (two) times daily. ) 10.2 g 5  . tamsulosin (FLOMAX) 0.4 MG CAPS capsule Take 0.4 mg by mouth daily.   2   No current facility-administered medications on file prior to visit.

## 2018-10-31 ENCOUNTER — Ambulatory Visit (HOSPITAL_COMMUNITY): Payer: Medicare Other

## 2018-11-02 ENCOUNTER — Ambulatory Visit (HOSPITAL_COMMUNITY): Payer: Medicare Other

## 2018-11-03 ENCOUNTER — Ambulatory Visit: Admit: 2018-11-03 | Payer: Medicare Other | Admitting: General Surgery

## 2018-11-03 SURGERY — REPAIR, HERNIA, INGUINAL, ADULT
Anesthesia: Spinal | Laterality: Left

## 2018-11-07 ENCOUNTER — Ambulatory Visit (HOSPITAL_COMMUNITY): Payer: Medicare Other

## 2018-11-09 ENCOUNTER — Ambulatory Visit (HOSPITAL_COMMUNITY): Payer: Medicare Other

## 2018-11-09 DIAGNOSIS — N410 Acute prostatitis: Secondary | ICD-10-CM | POA: Diagnosis not present

## 2018-11-09 DIAGNOSIS — N401 Enlarged prostate with lower urinary tract symptoms: Secondary | ICD-10-CM | POA: Diagnosis not present

## 2018-11-14 ENCOUNTER — Ambulatory Visit (HOSPITAL_COMMUNITY): Payer: Medicare Other

## 2018-11-16 ENCOUNTER — Ambulatory Visit (HOSPITAL_COMMUNITY): Payer: Medicare Other

## 2018-11-17 ENCOUNTER — Other Ambulatory Visit: Payer: Self-pay | Admitting: Internal Medicine

## 2018-11-21 ENCOUNTER — Ambulatory Visit (HOSPITAL_COMMUNITY): Payer: Medicare Other

## 2018-11-23 ENCOUNTER — Ambulatory Visit (HOSPITAL_COMMUNITY): Payer: Medicare Other

## 2018-11-28 ENCOUNTER — Ambulatory Visit (HOSPITAL_COMMUNITY): Payer: Medicare Other

## 2018-11-30 ENCOUNTER — Ambulatory Visit (HOSPITAL_COMMUNITY): Payer: Medicare Other

## 2018-11-30 DIAGNOSIS — L089 Local infection of the skin and subcutaneous tissue, unspecified: Secondary | ICD-10-CM | POA: Diagnosis not present

## 2018-11-30 DIAGNOSIS — L820 Inflamed seborrheic keratosis: Secondary | ICD-10-CM | POA: Diagnosis not present

## 2018-12-01 ENCOUNTER — Ambulatory Visit: Payer: Medicare Other | Admitting: Internal Medicine

## 2018-12-05 ENCOUNTER — Ambulatory Visit (HOSPITAL_COMMUNITY): Payer: Medicare Other

## 2018-12-05 DIAGNOSIS — L03116 Cellulitis of left lower limb: Secondary | ICD-10-CM | POA: Diagnosis not present

## 2018-12-07 ENCOUNTER — Ambulatory Visit (HOSPITAL_COMMUNITY): Payer: Medicare Other

## 2018-12-12 ENCOUNTER — Ambulatory Visit (HOSPITAL_COMMUNITY): Payer: Medicare Other

## 2018-12-14 ENCOUNTER — Ambulatory Visit (HOSPITAL_COMMUNITY): Payer: Medicare Other

## 2018-12-15 DIAGNOSIS — I872 Venous insufficiency (chronic) (peripheral): Secondary | ICD-10-CM | POA: Diagnosis not present

## 2018-12-15 DIAGNOSIS — D485 Neoplasm of uncertain behavior of skin: Secondary | ICD-10-CM | POA: Diagnosis not present

## 2018-12-19 ENCOUNTER — Ambulatory Visit (HOSPITAL_COMMUNITY): Payer: Medicare Other

## 2018-12-21 ENCOUNTER — Other Ambulatory Visit: Payer: Self-pay | Admitting: General Surgery

## 2018-12-21 ENCOUNTER — Ambulatory Visit (HOSPITAL_COMMUNITY): Payer: Medicare Other

## 2018-12-26 ENCOUNTER — Ambulatory Visit (HOSPITAL_COMMUNITY): Payer: Medicare Other

## 2018-12-26 DIAGNOSIS — D485 Neoplasm of uncertain behavior of skin: Secondary | ICD-10-CM | POA: Diagnosis not present

## 2018-12-26 DIAGNOSIS — L570 Actinic keratosis: Secondary | ICD-10-CM | POA: Diagnosis not present

## 2018-12-26 DIAGNOSIS — R609 Edema, unspecified: Secondary | ICD-10-CM | POA: Diagnosis not present

## 2018-12-26 DIAGNOSIS — L219 Seborrheic dermatitis, unspecified: Secondary | ICD-10-CM | POA: Diagnosis not present

## 2018-12-28 ENCOUNTER — Ambulatory Visit (HOSPITAL_COMMUNITY): Payer: Medicare Other

## 2019-01-02 ENCOUNTER — Ambulatory Visit (HOSPITAL_COMMUNITY): Payer: Medicare Other

## 2019-01-04 ENCOUNTER — Ambulatory Visit (HOSPITAL_COMMUNITY): Payer: Medicare Other

## 2019-01-09 ENCOUNTER — Ambulatory Visit (HOSPITAL_COMMUNITY): Payer: Medicare Other

## 2019-01-11 ENCOUNTER — Ambulatory Visit (HOSPITAL_COMMUNITY): Payer: Medicare Other

## 2019-01-16 ENCOUNTER — Ambulatory Visit (HOSPITAL_COMMUNITY): Payer: Medicare Other

## 2019-01-18 ENCOUNTER — Ambulatory Visit (HOSPITAL_COMMUNITY): Payer: Medicare Other

## 2019-01-23 ENCOUNTER — Ambulatory Visit (HOSPITAL_COMMUNITY): Payer: Medicare Other

## 2019-01-25 ENCOUNTER — Ambulatory Visit (HOSPITAL_COMMUNITY): Payer: Medicare Other

## 2019-01-30 ENCOUNTER — Ambulatory Visit (HOSPITAL_COMMUNITY): Payer: Medicare Other

## 2019-01-30 NOTE — Pre-Procedure Instructions (Signed)
[name]  01/30/2019      [place], [place] AT [facility] OF [address] [place] [address] Phone: [phone] Fax: [phone]    Your procedure is scheduled on 02/07/19.  Report to [facility] Admitting at 530 A.M.  Call this number if you have problems the morning of surgery:  [phone]   Remember:  Do Take these medicines the morning of surgery with A SIP OF WATER ----ALL INHALERS,LANOXIN,FLOMAX    Do not wear jewelry, make-up or nail polish.  Do not wear lotions, powders, or perfumes, or deodorant.  Do not shave 48 hours prior to surgery.  Men may shave face and neck.  Do not bring valuables to the hospital.  [facility] is not responsible for any belongings or valuables.  Contacts, dentures or bridgework may not be worn into surgery.  Leave your suitcase in the car.  After surgery it may be brought to your room.  For patients admitted to the hospital, discharge time will be determined by your treatment team.  Patients discharged the day of surgery will not be allowed to drive home.    Special instructions:  Do not take any aspirin,anti-inflammatories,vitamins,or herbal supplements 5-7 days prior to surgery.[place] - Preparing for Surgery  Before surgery, you can play an important role.  Because skin is not sterile, your skin needs to be as free of germs as possible.  You can reduce the number of germs on you skin by washing with CHG (chlorahexidine gluconate) soap before surgery.  CHG is an antiseptic cleaner which kills germs and bonds with the skin to continue killing germs even after washing.  Oral Hygiene is also important in reducing the risk of infection.  Remember to brush your teeth with your regular toothpaste the morning of surgery.  Please DO NOT use if you have an allergy to CHG or antibacterial soaps.  If your skin becomes reddened/irritated stop using the CHG and  inform your nurse when you arrive at Short Stay.  Do not shave (including legs and underarms) for at least 48 hours prior to the first CHG shower.  You may shave your face.  Please follow these instructions carefully:   1.  Shower with CHG Soap the night before surgery and the morning of Surgery.  2.  If you choose to wash your hair, wash your hair first as usual with your normal shampoo.  3.  After you shampoo, rinse your hair and body thoroughly to remove the shampoo. 4.  Use CHG as you would any other liquid soap.  You can apply chg directly to the skin and wash gently with a      scrungie or washcloth.           5.  Apply the CHG Soap to your body ONLY FROM THE NECK DOWN.   Do not use on open wounds or open sores. Avoid contact with your eyes, ears, mouth and genitals (private parts).  Wash genitals (private parts) with your normal soap.  6.  Wash thoroughly, paying special attention to the area where your surgery will be performed.  7.  Thoroughly rinse your body with warm water from the neck down.  8.  DO NOT shower/wash with your normal soap after using and rinsing off the CHG Soap.  9.  Pat yourself dry with a clean towel.  10.  Wear clean pajamas.            11.  Place clean sheets on your bed the night of your first shower and do not sleep with pets.  Day of Surgery  Do not apply any lotions/deoderants the morning of surgery.   Please wear clean clothes to the hospital/surgery center. Remember to brush your teeth with toothpaste.    Please read over the following fact sheets that you were given.

## 2019-01-31 ENCOUNTER — Encounter (HOSPITAL_COMMUNITY): Payer: Self-pay

## 2019-01-31 ENCOUNTER — Encounter (HOSPITAL_COMMUNITY)
Admission: RE | Admit: 2019-01-31 | Discharge: 2019-01-31 | Disposition: A | Discharge status: Home / Self Care | Payer: Medicare Other | Source: Ambulatory Visit | Attending: General Surgery | Admitting: General Surgery

## 2019-01-31 ENCOUNTER — Other Ambulatory Visit: Payer: Self-pay

## 2019-01-31 DIAGNOSIS — Z01812 Encounter for preprocedural laboratory examination: Secondary | ICD-10-CM | POA: Insufficient documentation

## 2019-01-31 LAB — BASIC METABOLIC PANEL
Anion gap: 9 (ref 5–15)
BUN: 12 mg/dL (ref 8–23)
CO2: 26 mmol/L (ref 22–32)
Calcium: 9.1 mg/dL (ref 8.9–10.3)
Chloride: 100 mmol/L (ref 98–111)
Creatinine, Ser: 0.58 mg/dL — ABNORMAL LOW (ref 0.61–1.24)
GFR calc Af Amer: >60 mL/min (ref >60)
GFR calc non Af Amer: >60 mL/min (ref >60)
Glucose, Bld: 94 mg/dL (ref 70–99)
Potassium: 4.4 mmol/L (ref 3.5–5.1)
Sodium: 135 mmol/L (ref 135–145)

## 2019-01-31 LAB — CBC
HCT: 40.8 % (ref 39.0–52.0)
Hemoglobin: 13 g/dL (ref 13.0–17.0)
MCH: 28.6 pg (ref 26.0–34.0)
MCHC: 31.9 g/dL (ref 30.0–36.0)
MCV: 89.9 fL (ref 80.0–100.0)
Platelets: 193 10*3/uL (ref 150–400)
RBC: 4.54 MIL/uL (ref 4.22–5.81)
RDW: 14 % (ref 11.5–15.5)
WBC: 7.7 10*3/uL (ref 4.0–10.5)
nRBC: 0 % (ref 0.0–0.2)

## 2019-01-31 NOTE — Progress Notes (Signed)
PCP - Hulan Fess Cardiologist - [place]  Chest x-ray - 10/19 EKG - 2/20 ECHO - 12/19    Aspirin Instructions: Stop taking ASA 7 days prior to surgery  Anesthesia review: yes, review note from pulmonologist Per letter from Dr. Annamaria Boots (pulmonologist) (in pt's chart): "GOT anesthesia should be avoided. He will be sensitive to oversedation with CO2 retention, to fluid overload, and to reflux with aspiration." Please review letter from [place].    Patient denies fever, cough and chest pain at PAT appointment Pt states he is always short of breath, not a new finding.  Pt is always on oxygen (via [place]) at 3-5 liters.   Patient verbalized understanding of instructions that were given to them at the PAT appointment. Patient was also instructed that they will need to review over the PAT instructions again at home before surgery.

## 2019-01-31 NOTE — Progress Notes (Signed)
[facility] [place], [place] AT [facility] OF [address] [place] [address] Phone: [phone] Fax: [phone]      Your procedure is scheduled on June 24th.  Report to [facility] Main Entrance "A" at 5:30 A.M., and check in at the Admitting office.  Call this number if you have problems the morning of surgery:  [phone]  Call [phone] if you have any questions prior to your surgery date Monday-Friday 8am-4pm    Remember:  Do not eat or drink after midnight.     Take these medicines the morning of surgery with A SIP OF WATER   Lanoxin  Flomax  Inhalers - if needed (please bring with you on day of surgery)  7 days prior to surgery STOP taking any Aspirin (unless otherwise instructed by your surgeon), Aleve, Naproxen, Ibuprofen, Motrin, Advil, Goody's, BC's, all herbal medications, fish oil, and all vitamins.    The Morning of Surgery  Do not wear jewelry, make-up or nail polish.  Do not wear lotions, powders, colognes, or deodorant  Do not shave 48 hours prior to surgery.  Men may shave face and neck.  Do not bring valuables to the hospital.  [facility] is not responsible for any belongings or valuables.  If you are a smoker, DO NOT Smoke 24 hours prior to surgery IF you wear a CPAP at night please bring your mask, tubing, and machine the morning of surgery   Remember that you must have someone to transport you home after your surgery, and remain with you for 24 hours if you are discharged the same day.   Contacts, glasses, hearing aids, dentures or bridgework may not be worn into surgery.    Leave your suitcase in the car.  After surgery it may be brought to your room.  For patients admitted to the hospital, discharge time will be determined by your treatment team.  Patients discharged the day of surgery will not be allowed to drive home.    Special instructions:   Eugenio Saenz- Preparing For  Surgery  Before surgery, you can play an important role. Because skin is not sterile, your skin needs to be as free of germs as possible. You can reduce the number of germs on your skin by washing with CHG (chlorahexidine gluconate) Soap before surgery.  CHG is an antiseptic cleaner which kills germs and bonds with the skin to continue killing germs even after washing.    Oral Hygiene is also important to reduce your risk of infection.  Remember - BRUSH YOUR TEETH THE MORNING OF SURGERY WITH YOUR REGULAR TOOTHPASTE  Please do not use if you have an allergy to CHG or antibacterial soaps. If your skin becomes reddened/irritated stop using the CHG.  Do not shave (including legs and underarms) for at least 48 hours prior to first CHG shower. It is OK to shave your face.  Please follow these instructions carefully.   1. Shower the NIGHT BEFORE SURGERY and the MORNING OF SURGERY with CHG Soap.   2. If you chose to wash your hair, wash your hair first as usual with your normal shampoo.  3. After you shampoo, rinse your hair and body thoroughly to remove the shampoo.  4. Use CHG as you would any other liquid soap. You can apply CHG directly to the skin and wash gently with a scrungie or a clean washcloth.   5. Apply the CHG  Soap to your body ONLY FROM THE NECK DOWN.  Do not use on open wounds or open sores. Avoid contact with your eyes, ears, mouth and genitals (private parts). Wash Face and genitals (private parts)  with your normal soap.   6. Wash thoroughly, paying special attention to the area where your surgery will be performed.  7. Thoroughly rinse your body with warm water from the neck down.  8. DO NOT shower/wash with your normal soap after using and rinsing off the CHG Soap.  9. Pat yourself dry with a CLEAN TOWEL.  10. Wear CLEAN PAJAMAS to bed the night before surgery, wear comfortable clothes the morning of surgery  11. Place CLEAN SHEETS on your bed the night of your first  shower and DO NOT SLEEP WITH PETS.    Day of Surgery:  Do not apply any deodorants/lotions.  Please wear clean clothes to the hospital/surgery center.   Remember to brush your teeth WITH YOUR REGULAR TOOTHPASTE.   Please read over the following fact sheets that you were given.

## 2019-02-01 ENCOUNTER — Ambulatory Visit (HOSPITAL_COMMUNITY): Payer: Medicare Other

## 2019-02-01 ENCOUNTER — Other Ambulatory Visit: Payer: Self-pay | Admitting: General Surgery

## 2019-02-03 ENCOUNTER — Other Ambulatory Visit (HOSPITAL_COMMUNITY)
Admission: RE | Admit: 2019-02-03 | Discharge: 2019-02-03 | Disposition: A | Discharge status: Home / Self Care | Payer: Medicare Other | Source: Ambulatory Visit | Attending: General Surgery | Admitting: General Surgery

## 2019-02-03 DIAGNOSIS — Z01812 Encounter for preprocedural laboratory examination: Secondary | ICD-10-CM | POA: Diagnosis not present

## 2019-02-03 NOTE — H&P (Signed)
[name] Location: [facility] Surgery Patient #: [id number] DOB: April 01, 1935 Married / Language: English / Race: White Male      History of Present Illness     .This is an 84 year old gentleman who returns 1 month after initial evaluation with progressive symptoms related to his left inguinal hernia. He is a member of[employer]. His PCP is Lennette Bihari Little. Baird Lyons is his pulmonologist. He sees Sherren Mocha because of coronary disease and remote history of paroxysmal atrial tachycardia. He has oxygen-dependent COPD     He noticed a left inguinal hernia in October last year after coughing a lot when he was treated for pneumonia. He is on antibiotics for 1 month and constipated. Noticed a bulge but really wasn't having any pain I saw him 1 month ago and we decided to hold off because his symptoms were minimal and the hernia was soft and easily reducible Since that time he is having increasing discomfort when he coughs or moves and he's felt a popping sensation sometimes. When he is in bed at night he doesn't have any symptoms denies nausea or vomiting     Morbidities include oxygen dependent COPD. 3 L of oxygen for 12 years. Not dyspneic at rest but only walks a few feet before he gets short of breath. Stress his own car. Hypertension. BMI 23. BPH. History PAT in the 1980s. Takes an aspirin. Open appendectomy tonsillectomy. No anesthesia over 12 years      When I examined him today his abdomen was soft but there was a hernia bulge in his left groin sizable large egg. I could not reduce to standing. I had to forcibly pop it back in when he was supine and he was a little bit tender when I did that. I think he is heading for an incarceration or strangulation. I told him I wanted to consider open repair of left inguinal hernia with mesh under spinal anesthesia with exparel and TAPP nerve block and overnight observation. He agrees We will schedule  surgery after he undergoes formal risk assessment with Dr. Baird Lyons and Dr. Sherren Mocha    Allergies Tolectin *ANALGESICS - ANTI-INFLAMMATORY*  Augmentin *PENICILLINS*  Neosporin *OPHTHALMIC AGENTS*  Allergies Reconciled   Medication History Aspirin (81MG  Tablet, Oral) Active. Losartan Potassium (50MG  Tablet, Oral) Active. Nitroglycerin (0.4MG  Tab Sublingual, Sublingual) Active. ProAir HFA (108 (90 Base)MCG/ACT Aerosol Soln, Inhalation) Active. Spiriva Respimat (2.5MCG/ACT Aerosol Soln, Inhalation) Active. Digoxin-Tab (0.25MG  Tablet, Oral) Active. Pravastatin Sodium (40MG  Tablet, Oral) Active. Tamsulosin HCl (0.4MG  Capsule, Oral) Active. Symbicort (160-4.5MCG/ACT Aerosol, Inhalation) Active. Medications Reconciled  Vitals  Weight: 155 lb Height: 68in Body Surface Area: 1.83 m Body Mass Index: 23.57 kg/m  Temp.: 97.90F (Temporal)  Peak Flow: 3L/min BP: 132/60(Sitting, Left Arm, Standard)     Physical Exam General Mental Status-Alert. General Appearance-Not in acute distress. Build & Nutrition-Well nourished. Posture-Normal posture. Gait-Normal.  Head and Neck Head-normocephalic, atraumatic with no lesions or palpable masses. Trachea-midline. Thyroid Gland Characteristics - normal size and consistency and no palpable nodules.  Chest and Lung Exam Chest and lung exam reveals -on auscultation, normal breath sounds, no adventitious sounds and normal vocal resonance. Note: Breath sounds distant. No rhonchi or wheeze. He does use accessory muscles at rest.   Cardiovascular Cardiovascular examination reveals -normal heart sounds, regular rate and rhythm with no murmurs and femoral artery auscultation bilaterally reveals normal pulses, no bruits, no thrills.  Abdomen Inspection Inspection of the abdomen reveals - No Hernias. Palpation/Percussion Palpation and  Percussion of the abdomen reveal - Soft, Non Tender, No  Rigidity (guarding), No hepatosplenomegaly and No Palpable abdominal masses.  Male Genitourinary Note: Left inguinal hernia bulge size of an egg. Could not reduce it standing. Supine I was able to pop it back in but he was tender. No scrotal mass.  Neurologic Neurologic evaluation reveals -alert and oriented x 3 with no impairment of recent or remote memory, normal attention span and ability to concentrate, normal sensation and normal coordination.  Musculoskeletal Normal Exam - Bilateral-Upper Extremity Strength Normal and Lower Extremity Strength Normal.    Assessment & Plan   LEFT INGUINAL HERNIA (K40.90)   Your left inguinal hernia is becoming more of a problem You are now having more pain and a popping sensation I had to forcibly reduce the hernia today This has progressed since I saw you one month ago  I think that you are now developing a greater risk for incarceration and strangulation, which would be associated with a high complication rate considering your lung disease and heart disease Although high risk, it would be safer to operate on you electively following cardiac and pulmonary risk assessment We talked about this for a long time and you agree  We will ask Dr. Sherren Mocha and Dr. Keturah Barre to expedite risk assessment and clearance for surgery Assuming that anesthesia agrees, we would proceed with open repair of left inguinal hernia with mesh under spinal anesthesia and a nerve block you would be admitted to the hospital overnight to make sure that sure life status remained stable The surgery would be done at [facility]   COPD, SEVERE (J44.9)   OXYGEN DEPENDENT (Z99.81)   HISTORY OF PAT (PAROXYSMAL ATRIAL TACHYCARDIA) (Z86.79)   BPH (BENIGN PROSTATIC HYPERPLASIA) (N40.0)   HYPERTENSION, BENIGN (I10)    Latash Nouri [name], M.D., [facility] General and Minimally invasive Surgery Breast and Colorectal  Surgery Office:   [phone] Pager:   [phone]

## 2019-02-04 LAB — SARS CORONAVIRUS 2 (TAT 6-24 HRS)

## 2019-02-05 ENCOUNTER — Other Ambulatory Visit (HOSPITAL_COMMUNITY)
Admission: RE | Admit: 2019-02-05 | Discharge: 2019-02-05 | Disposition: A | Discharge status: Home / Self Care | Payer: Medicare Other | Source: Ambulatory Visit | Attending: General Surgery | Admitting: General Surgery

## 2019-02-05 DIAGNOSIS — Z1159 Encounter for screening for other viral diseases: Secondary | ICD-10-CM | POA: Insufficient documentation

## 2019-02-05 LAB — SARS CORONAVIRUS 2 BY RT PCR (HOSPITAL ORDER, PERFORMED IN ~~LOC~~ HOSPITAL LAB): SARS Coronavirus 2: NEGATIVE

## 2019-02-06 ENCOUNTER — Ambulatory Visit (HOSPITAL_COMMUNITY): Payer: Medicare Other

## 2019-02-06 MED ORDER — BUPIVACAINE LIPOSOME 1.3 % IJ SUSP
20.0000 mL | INTRAMUSCULAR | Status: DC
  Filled 2019-02-06: qty 20

## 2019-02-06 NOTE — Anesthesia Preprocedure Evaluation (Signed)
Anesthesia Evaluation    Reviewed: Allergy & Precautions, Patient's Chart, lab work & pertinent test results  Airway Mallampati: II  TM Distance: >3 FB Neck ROM: Full    Dental  (+) Dental Advisory Given   Pulmonary shortness of breath, pneumonia, COPD,  oxygen dependent, former smoker,    Pulmonary exam normal breath sounds clear to auscultation       Cardiovascular hypertension, + CAD  Normal cardiovascular exam+ dysrhythmias  Rhythm:Regular Rate:Normal  Echo 07/2018 - Left ventricle: The cavity size was normal. Systolic function was normal. The estimated ejection fraction was in the range of 55% to 60%. Wall motion was normal; there were no regional wall motion abnormalities. Doppler parameters are consistent with abnormal left ventricular relaxation (grade 1 diastolic dysfunction). - Aortic valve: Trileaflet; mildly thickened, mildly calcified  leaflets. Transvalvular velocity was within the normal range. There was no stenosis. - Right ventricle: The cavity size was mildly dilated. Wall   thickness was normal. Systolic function was normal. - Right atrium: The atrium was moderately dilated. - Tricuspid valve: There was moderate regurgitation. - Pulmonic valve: There was trivial regurgitation. - Pulmonary arteries: Systolic pressure was moderately increased. PA peak pressure: 49 mm Hg (S). - Inferior vena cava: The vessel was normal in size.    Neuro/Psych  Neuromuscular disease    GI/Hepatic negative GI ROS, Neg liver ROS,   Endo/Other  negative endocrine ROS  Renal/GU negative Renal ROS     Musculoskeletal  (+) Arthritis ,   Abdominal   Peds  Hematology negative hematology ROS (+)   Anesthesia Other Findings Day of surgery medications reviewed with the patient.  Reproductive/Obstetrics                             Anesthesia Physical Anesthesia Plan  ASA: IV  Anesthesia Plan:     Post-op Pain Management:  Regional for Post-op pain   Induction:   PONV Risk Score and Plan:   Airway Management Planned:   Additional Equipment:   Intra-op Plan:   Post-operative Plan:   Informed Consent: I have reviewed the patients History and Physical, chart, labs and discussed the procedure including the risks, benefits and alternatives for the proposed anesthesia with the patient or authorized representative who has indicated his/her understanding and acceptance.     Dental advisory given  Plan Discussed with: CRNA  Anesthesia Plan Comments:         Anesthesia Quick Evaluation

## 2019-02-07 ENCOUNTER — Other Ambulatory Visit: Payer: Self-pay

## 2019-02-07 ENCOUNTER — Ambulatory Visit (HOSPITAL_COMMUNITY)
Admission: RE | Admit: 2019-02-07 | Discharge: 2019-02-08 | Disposition: A | Discharge status: Home / Self Care | Payer: Medicare Other | Attending: General Surgery | Admitting: General Surgery

## 2019-02-07 ENCOUNTER — Encounter (HOSPITAL_COMMUNITY)
Admission: RE | Disposition: A | Discharge status: Home / Self Care | Payer: Self-pay | Source: Home / Self Care | Attending: General Surgery

## 2019-02-07 ENCOUNTER — Ambulatory Visit (HOSPITAL_COMMUNITY): Payer: Medicare Other | Admitting: Anesthesiology

## 2019-02-07 ENCOUNTER — Ambulatory Visit (HOSPITAL_COMMUNITY): Payer: Medicare Other | Admitting: Physician Assistant

## 2019-02-07 ENCOUNTER — Encounter (HOSPITAL_COMMUNITY): Payer: Self-pay

## 2019-02-07 DIAGNOSIS — N400 Enlarged prostate without lower urinary tract symptoms: Secondary | ICD-10-CM | POA: Insufficient documentation

## 2019-02-07 DIAGNOSIS — Z880 Allergy status to penicillin: Secondary | ICD-10-CM | POA: Diagnosis not present

## 2019-02-07 DIAGNOSIS — I2510 Atherosclerotic heart disease of native coronary artery without angina pectoris: Secondary | ICD-10-CM | POA: Diagnosis present

## 2019-02-07 DIAGNOSIS — I10 Essential (primary) hypertension: Secondary | ICD-10-CM | POA: Insufficient documentation

## 2019-02-07 DIAGNOSIS — Z7951 Long term (current) use of inhaled steroids: Secondary | ICD-10-CM | POA: Diagnosis not present

## 2019-02-07 DIAGNOSIS — Z886 Allergy status to analgesic agent status: Secondary | ICD-10-CM | POA: Diagnosis not present

## 2019-02-07 DIAGNOSIS — Z9981 Dependence on supplemental oxygen: Secondary | ICD-10-CM | POA: Diagnosis not present

## 2019-02-07 DIAGNOSIS — Z79899 Other long term (current) drug therapy: Secondary | ICD-10-CM | POA: Insufficient documentation

## 2019-02-07 DIAGNOSIS — K4090 Unilateral inguinal hernia, without obstruction or gangrene, not specified as recurrent: Secondary | ICD-10-CM | POA: Insufficient documentation

## 2019-02-07 DIAGNOSIS — Z881 Allergy status to other antibiotic agents status: Secondary | ICD-10-CM | POA: Diagnosis not present

## 2019-02-07 DIAGNOSIS — J449 Chronic obstructive pulmonary disease, unspecified: Secondary | ICD-10-CM | POA: Insufficient documentation

## 2019-02-07 DIAGNOSIS — J9611 Chronic respiratory failure with hypoxia: Secondary | ICD-10-CM | POA: Diagnosis present

## 2019-02-07 DIAGNOSIS — Z7982 Long term (current) use of aspirin: Secondary | ICD-10-CM | POA: Diagnosis not present

## 2019-02-07 DIAGNOSIS — G8918 Other acute postprocedural pain: Secondary | ICD-10-CM | POA: Diagnosis not present

## 2019-02-07 DIAGNOSIS — I471 Supraventricular tachycardia: Secondary | ICD-10-CM | POA: Diagnosis not present

## 2019-02-07 DIAGNOSIS — J189 Pneumonia, unspecified organism: Secondary | ICD-10-CM | POA: Diagnosis not present

## 2019-02-07 HISTORY — DX: Unilateral inguinal hernia, without obstruction or gangrene, not specified as recurrent: K40.90

## 2019-02-07 HISTORY — PX: INGUINAL HERNIA REPAIR: SHX194

## 2019-02-07 LAB — CBC
HCT: 35.5 % — ABNORMAL LOW (ref 39.0–52.0)
Hemoglobin: 11.5 g/dL — ABNORMAL LOW (ref 13.0–17.0)
MCH: 28.7 pg (ref 26.0–34.0)
MCHC: 32.4 g/dL (ref 30.0–36.0)
MCV: 88.5 fL (ref 80.0–100.0)
Platelets: 157 K/uL (ref 150–400)
RBC: 4.01 MIL/uL — ABNORMAL LOW (ref 4.22–5.81)
RDW: 13.7 % (ref 11.5–15.5)
WBC: 7.1 K/uL (ref 4.0–10.5)
nRBC: 0 % (ref 0.0–0.2)

## 2019-02-07 LAB — CREATININE, SERUM
Creatinine, Ser: 0.6 mg/dL — ABNORMAL LOW (ref 0.61–1.24)
GFR calc Af Amer: >60 mL/min (ref >60)
GFR calc non Af Amer: >60 mL/min (ref >60)

## 2019-02-07 LAB — GLUCOSE, CAPILLARY: Glucose-Capillary: 106 mg/dL — ABNORMAL HIGH (ref 70–99)

## 2019-02-07 SURGERY — REPAIR, HERNIA, INGUINAL, ADULT
Anesthesia: Spinal | Site: Groin | Laterality: Left

## 2019-02-07 MED ORDER — ONDANSETRON HCL 4 MG/2ML IJ SOLN
INTRAMUSCULAR | Status: DC | PRN
  Administered 2019-02-07: 4 mg via INTRAVENOUS

## 2019-02-07 MED ORDER — ONDANSETRON HCL 4 MG/2ML IJ SOLN: 4.0000 mg | Freq: Four times a day (QID) | INTRAMUSCULAR | Status: DC | PRN

## 2019-02-07 MED ORDER — BUPIVACAINE LIPOSOME 1.3 % IJ SUSP
INTRAMUSCULAR | Status: DC | PRN
  Administered 2019-02-07: 10 mL

## 2019-02-07 MED ORDER — LACTATED RINGERS IV SOLN
INTRAVENOUS | Status: DC
  Administered 2019-02-07 – 2019-02-08 (×2): via INTRAVENOUS

## 2019-02-07 MED ORDER — ENSURE ENLIVE PO LIQD
1.0000 | Freq: Every day | ORAL | Status: DC
  Filled 2019-02-07: qty 237

## 2019-02-07 MED ORDER — ENOXAPARIN SODIUM 40 MG/0.4ML ~~LOC~~ SOLN
40.0000 mg | SUBCUTANEOUS | Status: DC
  Administered 2019-02-08: 40 mg via SUBCUTANEOUS
  Filled 2019-02-07: qty 0.4

## 2019-02-07 MED ORDER — BUPIVACAINE-EPINEPHRINE 0.5% -1:200000 IJ SOLN
INTRAMUSCULAR | Status: DC | PRN
  Administered 2019-02-07: 12 mL

## 2019-02-07 MED ORDER — ONDANSETRON HCL 4 MG/2ML IJ SOLN
INTRAMUSCULAR | Status: AC
  Filled 2019-02-07: qty 2

## 2019-02-07 MED ORDER — IPRATROPIUM-ALBUTEROL 0.5-2.5 (3) MG/3ML IN SOLN: 3.0000 mL | Freq: Four times a day (QID) | RESPIRATORY_TRACT | Status: DC | PRN

## 2019-02-07 MED ORDER — ACETAMINOPHEN 500 MG PO TABS
1000.0000 mg | ORAL_TABLET | Freq: Four times a day (QID) | ORAL | Status: DC
  Administered 2019-02-07 – 2019-02-08 (×4): 1000 mg via ORAL
  Filled 2019-02-07 (×4): qty 2

## 2019-02-07 MED ORDER — BUPIVACAINE HCL (PF) 0.5 % IJ SOLN
INTRAMUSCULAR | Status: DC | PRN
  Administered 2019-02-07: 10 mL

## 2019-02-07 MED ORDER — LACTATED RINGERS IV SOLN
INTRAVENOUS | Status: DC | PRN
  Administered 2019-02-07 (×2): via INTRAVENOUS

## 2019-02-07 MED ORDER — GABAPENTIN 300 MG PO CAPS
300.0000 mg | ORAL_CAPSULE | ORAL | Status: AC
  Administered 2019-02-07: 300 mg via ORAL
  Filled 2019-02-07: qty 1

## 2019-02-07 MED ORDER — PROPOFOL 500 MG/50ML IV EMUL
INTRAVENOUS | Status: DC | PRN
  Administered 2019-02-07: 25 ug/kg/min via INTRAVENOUS

## 2019-02-07 MED ORDER — HYDROMORPHONE HCL 1 MG/ML IJ SOLN: 0.5000 mg | INTRAMUSCULAR | Status: DC | PRN

## 2019-02-07 MED ORDER — ALBUTEROL SULFATE (2.5 MG/3ML) 0.083% IN NEBU: 3.0000 mL | INHALATION_SOLUTION | Freq: Four times a day (QID) | RESPIRATORY_TRACT | Status: DC | PRN

## 2019-02-07 MED ORDER — TAMSULOSIN HCL 0.4 MG PO CAPS
0.4000 mg | ORAL_CAPSULE | Freq: Every day | ORAL | Status: DC
  Administered 2019-02-07: 0.4 mg via ORAL
  Filled 2019-02-07 (×2): qty 1

## 2019-02-07 MED ORDER — ACETAMINOPHEN 500 MG PO TABS
1000.0000 mg | ORAL_TABLET | ORAL | Status: AC
  Administered 2019-02-07: 1000 mg via ORAL
  Filled 2019-02-07: qty 2

## 2019-02-07 MED ORDER — SODIUM CHLORIDE 0.9 % IV SOLN
INTRAVENOUS | Status: DC | PRN
  Administered 2019-02-07: 08:00:00 25 ug/min via INTRAVENOUS

## 2019-02-07 MED ORDER — PROPOFOL 10 MG/ML IV BOLUS
INTRAVENOUS | Status: DC | PRN
  Administered 2019-02-07: 20 mg via INTRAVENOUS

## 2019-02-07 MED ORDER — PANTOPRAZOLE SODIUM 40 MG IV SOLR
40.0000 mg | Freq: Every day | INTRAVENOUS | Status: DC
  Administered 2019-02-07: 40 mg via INTRAVENOUS
  Filled 2019-02-07: qty 40

## 2019-02-07 MED ORDER — BUPIVACAINE-EPINEPHRINE (PF) 0.5% -1:200000 IJ SOLN
INTRAMUSCULAR | Status: AC
  Filled 2019-02-07: qty 30

## 2019-02-07 MED ORDER — PRAVASTATIN SODIUM 40 MG PO TABS
40.0000 mg | ORAL_TABLET | Freq: Every day | ORAL | Status: DC
  Administered 2019-02-07: 40 mg via ORAL
  Filled 2019-02-07 (×2): qty 1

## 2019-02-07 MED ORDER — PROPOFOL 10 MG/ML IV BOLUS
INTRAVENOUS | Status: AC
  Filled 2019-02-07: qty 20

## 2019-02-07 MED ORDER — TRAMADOL HCL 50 MG PO TABS: 50.0000 mg | ORAL_TABLET | Freq: Four times a day (QID) | ORAL | Status: DC | PRN

## 2019-02-07 MED ORDER — ONDANSETRON 4 MG PO TBDP: 4.0000 mg | ORAL_TABLET | Freq: Four times a day (QID) | ORAL | Status: DC | PRN

## 2019-02-07 MED ORDER — LACTATED RINGERS IV SOLN: INTRAVENOUS | Status: DC

## 2019-02-07 MED ORDER — MOMETASONE FURO-FORMOTEROL FUM 200-5 MCG/ACT IN AERO
2.0000 | INHALATION_SPRAY | Freq: Two times a day (BID) | RESPIRATORY_TRACT | Status: DC
  Administered 2019-02-07 – 2019-02-08 (×2): 2 via RESPIRATORY_TRACT
  Filled 2019-02-07: qty 8.8

## 2019-02-07 MED ORDER — SENNA 8.6 MG PO TABS
1.0000 | ORAL_TABLET | Freq: Two times a day (BID) | ORAL | Status: DC
  Administered 2019-02-07 – 2019-02-08 (×3): 8.6 mg via ORAL
  Filled 2019-02-07 (×3): qty 1

## 2019-02-07 MED ORDER — POLYETHYLENE GLYCOL 3350 17 GM/SCOOP PO POWD
17.0000 g | Freq: Every day | ORAL | Status: DC
  Filled 2019-02-07: qty 255

## 2019-02-07 MED ORDER — FENTANYL CITRATE (PF) 250 MCG/5ML IJ SOLN
INTRAMUSCULAR | Status: AC
  Filled 2019-02-07: qty 5

## 2019-02-07 MED ORDER — FENTANYL CITRATE (PF) 250 MCG/5ML IJ SOLN
INTRAMUSCULAR | Status: DC | PRN
  Administered 2019-02-07: 25 ug via INTRAVENOUS

## 2019-02-07 MED ORDER — LOSARTAN POTASSIUM 50 MG PO TABS
25.0000 mg | ORAL_TABLET | Freq: Every day | ORAL | Status: DC
  Administered 2019-02-07 – 2019-02-08 (×2): 25 mg via ORAL
  Filled 2019-02-07 (×2): qty 1

## 2019-02-07 MED ORDER — UMECLIDINIUM BROMIDE 62.5 MCG/INH IN AEPB
2.0000 | INHALATION_SPRAY | Freq: Every day | RESPIRATORY_TRACT | Status: DC
  Filled 2019-02-07: qty 7

## 2019-02-07 MED ORDER — DIGOXIN 125 MCG PO TABS
0.2500 mg | ORAL_TABLET | Freq: Every day | ORAL | Status: DC
  Administered 2019-02-08: 0.25 mg via ORAL
  Filled 2019-02-07: qty 2

## 2019-02-07 MED ORDER — CEFAZOLIN SODIUM-DEXTROSE 2-4 GM/100ML-% IV SOLN
2.0000 g | INTRAVENOUS | Status: AC
  Administered 2019-02-07: 08:00:00 2 g via INTRAVENOUS
  Filled 2019-02-07: qty 100

## 2019-02-07 MED ORDER — CHLORHEXIDINE GLUCONATE CLOTH 2 % EX PADS: 6.0000 | MEDICATED_PAD | Freq: Once | CUTANEOUS | Status: DC

## 2019-02-07 MED ORDER — 0.9 % SODIUM CHLORIDE (POUR BTL) OPTIME
TOPICAL | Status: DC | PRN
  Administered 2019-02-07: 1000 mL

## 2019-02-07 MED ORDER — ASPIRIN EC 81 MG PO TBEC
81.0000 mg | DELAYED_RELEASE_TABLET | Freq: Every day | ORAL | Status: DC
  Administered 2019-02-08: 81 mg via ORAL
  Filled 2019-02-07: qty 1

## 2019-02-07 SURGICAL SUPPLY — 44 items
ADH SKN CLS APL DERMABOND .7 (GAUZE/BANDAGES/DRESSINGS) ×1
BLADE CLIPPER SURG (BLADE) IMPLANT
CANISTER SUCT 3000ML PPV (MISCELLANEOUS) ×2 IMPLANT
CHLORAPREP W/TINT 26ML (MISCELLANEOUS) ×2 IMPLANT
COVER SURGICAL LIGHT HANDLE (MISCELLANEOUS) ×2 IMPLANT
COVER WAND RF STERILE (DRAPES) IMPLANT
DERMABOND ADVANCED (GAUZE/BANDAGES/DRESSINGS) ×1
DERMABOND ADVANCED .7 DNX12 (GAUZE/BANDAGES/DRESSINGS) ×1 IMPLANT
DRAIN PENROSE 1/2X12 LTX STRL (WOUND CARE) ×1 IMPLANT
DRAPE LAPAROTOMY TRNSV 102X78 (DRAPE) ×2 IMPLANT
ELECT REM PT RETURN 9FT ADLT (ELECTROSURGICAL) ×2
ELECTRODE REM PT RTRN 9FT ADLT (ELECTROSURGICAL) ×1 IMPLANT
GLOVE BIOGEL PI IND STRL 7.0 (GLOVE) ×2 IMPLANT
GLOVE BIOGEL PI IND STRL 8.5 (GLOVE) ×1 IMPLANT
GLOVE BIOGEL PI INDICATOR 7.0 (GLOVE) ×2
GLOVE BIOGEL PI INDICATOR 8.5 (GLOVE) ×1
GLOVE EUDERMIC 7 POWDERFREE (GLOVE) ×2 IMPLANT
GLOVE SURG SS PI 6.5 STRL IVOR (GLOVE) ×1 IMPLANT
GOWN STRL REUS W/ TWL LRG LVL3 (GOWN DISPOSABLE) ×1 IMPLANT
GOWN STRL REUS W/ TWL XL LVL3 (GOWN DISPOSABLE) ×1 IMPLANT
GOWN STRL REUS W/TWL LRG LVL3 (GOWN DISPOSABLE) ×4
GOWN STRL REUS W/TWL XL LVL3 (GOWN DISPOSABLE) ×2
KIT BASIN OR (CUSTOM PROCEDURE TRAY) ×2 IMPLANT
KIT TURNOVER KIT B (KITS) ×2 IMPLANT
MESH ULTRAPRO 3X6 7.6X15CM (Mesh General) ×1 IMPLANT
NDL HYPO 25GX1X1/2 BEV (NEEDLE) ×1 IMPLANT
NEEDLE HYPO 25GX1X1/2 BEV (NEEDLE) ×2 IMPLANT
NS IRRIG 1000ML POUR BTL (IV SOLUTION) ×2 IMPLANT
PACK GENERAL/GYN (CUSTOM PROCEDURE TRAY) ×2 IMPLANT
PAD ARMBOARD 7.5X6 YLW CONV (MISCELLANEOUS) ×2 IMPLANT
PENCIL SMOKE EVACUATOR (MISCELLANEOUS) ×2 IMPLANT
SUT MNCRL AB 4-0 PS2 18 (SUTURE) ×2 IMPLANT
SUT PROLENE 2 0 CT2 30 (SUTURE) ×6 IMPLANT
SUT SILK 2 0 (SUTURE) ×2
SUT SILK 2 0 SH (SUTURE) IMPLANT
SUT SILK 2-0 18XBRD TIE 12 (SUTURE) ×1 IMPLANT
SUT VIC AB 2-0 BRD 54 (SUTURE) ×2 IMPLANT
SUT VIC AB 2-0 CT1 27 (SUTURE) ×2
SUT VIC AB 2-0 CT1 TAPERPNT 27 (SUTURE) ×1 IMPLANT
SUT VIC AB 3-0 SH 27 (SUTURE) ×2
SUT VIC AB 3-0 SH 27XBRD (SUTURE) ×1 IMPLANT
SYR CONTROL 10ML LL (SYRINGE) ×2 IMPLANT
TOWEL GREEN STERILE FF (TOWEL DISPOSABLE) ×2 IMPLANT
TOWEL OR 17X26 10 PK STRL BLUE (TOWEL DISPOSABLE) ×2 IMPLANT

## 2019-02-07 NOTE — Anesthesia Postprocedure Evaluation (Signed)
Anesthesia Post Note  Patient: [name]  Procedure(s) Performed: OPEN REPAIR LEFT INGUINAL HERNIA WITH MESH (Left Groin)     Patient location during evaluation: PACU Anesthesia Type: Spinal Level of consciousness: awake and alert Pain management: pain level controlled Vital Signs Assessment: post-procedure vital signs reviewed and stable Respiratory status: spontaneous breathing and respiratory function stable Cardiovascular status: blood pressure returned to baseline and stable Postop Assessment: spinal receding Anesthetic complications: no    Last Vitals:  Vitals:   02/07/19 1050 02/07/19 1057  BP: (!) 132/108 (!) 148/64  Pulse: 66   Resp: 16   Temp: 36.6 C   SpO2: (!) 74%     Last Pain:  Vitals:   02/07/19 1100  TempSrc:   PainSc: 0-No pain                 Nolon Nations

## 2019-02-07 NOTE — Anesthesia Procedure Notes (Signed)
Anesthesia Regional Block: TAP block   Pre-Anesthetic Checklist: ,, timeout performed, Correct Patient, Correct Site, Correct Laterality, Correct Procedure, Correct Position, site marked, Risks and benefits discussed,  Surgical consent,  Pre-op evaluation,  At surgeon's request and post-op pain management  Laterality: Left  Prep: chloraprep       Needles:   Needle Type: Stimiplex     Needle Length: 9cm      Additional Needles:   Procedures:,,,, ultrasound used (permanent image in chart),,,,  Narrative:  Start time: 02/07/2019 7:15 AM End time: 02/07/2019 7:20 AM Injection made incrementally with aspirations every 5 mL.  Performed by: Personally  Anesthesiologist: Nolon Nations, MD  Additional Notes: Patient tolerated well. Good fascial spread noted.

## 2019-02-07 NOTE — Anesthesia Procedure Notes (Signed)
Spinal  Patient location during procedure: OR Start time: 02/07/2019 7:30 AM End time: 02/07/2019 7:43 AM Staffing Anesthesiologist: Nolon Nations, MD Performed: anesthesiologist  Preanesthetic Checklist Completed: patient identified, site marked, surgical consent, pre-op evaluation, timeout performed, IV checked, risks and benefits discussed and monitors and equipment checked Spinal Block Patient position: sitting Prep: ChloraPrep and site prepped and draped Patient monitoring: heart rate, cardiac monitor, continuous pulse ox and blood pressure Approach: right paramedian Location: L2-3 Injection technique: single-shot Needle Needle type: Sprotte  Needle gauge: 24 G Needle length: 9 cm Assessment Sensory level: T4 Additional Notes Expiration date of kit checked and confirmed. Very difficult placement. Initially clear CSF with good aspiration, but lost flow. Needle repositioned and blood noted. Needle removed and CSF reobtained. Patient tolerated procedure well, without complications.

## 2019-02-07 NOTE — Interval H&P Note (Signed)
History and Physical Interval Note:  02/07/2019 7:18 AM  [name]  has presented today for surgery, with the diagnosis of LEFT INGUINAL HERNIA.  The various methods of treatment have been discussed with the patient and family. After consideration of risks, benefits and other options for treatment, the patient has consented to  Procedure(s) with comments: OPEN REPAIR LEFT INGUINAL HERNIA WITH MESH (Left) - SPINAL AND TAP BLOCK ANESTHESIA as a surgical intervention.  The patient's history has been reviewed, patient examined, no change in status, stable for surgery.  I have reviewed the patient's chart and labs.  Questions were answered to the patient's satisfaction.     Adin Hector

## 2019-02-07 NOTE — Plan of Care (Signed)

## 2019-02-07 NOTE — Transfer of Care (Signed)
Immediate Anesthesia Transfer of Care Note  Patient: [name]  Procedure(s) Performed: OPEN REPAIR LEFT INGUINAL HERNIA WITH MESH (Left Groin)  Patient Location: PACU  Anesthesia Type:Regional and Spinal  Level of Consciousness: awake, alert , oriented and patient cooperative  Airway & Oxygen Therapy: Patient Spontanous Breathing and Patient connected to nasal cannula oxygen  Post-op Assessment: Report given to RN and Post -op Vital signs reviewed and stable  Post vital signs: Reviewed and stable  Last Vitals:  Vitals Value Taken Time  BP 124/50 02/07/19 0857  Temp    Pulse 69 02/07/19 0856  Resp 2 02/07/19 0856  SpO2 100 % 02/07/19 0856  Vitals shown include unvalidated device data.  Last Pain:  Vitals:   02/07/19 0606  PainSc: 2       Patients Stated Pain Goal: 1 (88/41/66 0630)  Complications: No apparent anesthesia complications

## 2019-02-07 NOTE — Op Note (Signed)
Patient Name:           [name]   Date of Surgery:        02/07/2019  Pre op Diagnosis:      Left inguinal hernia  Post op Diagnosis:    Indirect left inguinal hernia  Procedure:                 Open repair left inguinal hernia with mesh Karl Pock repair)  Surgeon:                     Edsel Petrin. Dalbert Batman, M.D., FACS  Assistant:                      Or staff  Operative Indications:   This is an 84 year old gentleman who returns with progressive symptoms related to his left inguinal hernia.  His PCP is Lennette Bihari Little. Baird Lyons is his pulmonologist. He sees Sherren Mocha because of coronary disease and remote history of paroxysmal atrial tachycardia. He has oxygen-dependent COPD     He noticed a left inguinal hernia in October last year after coughing a lot when he was treated for pneumonia. He was on antibiotics for 1 month and constipated. Noticed a bulge but really wasn't having any pain I saw him 1 month ago and we decided to hold off because his symptoms were minimal and the hernia was soft and easily reducible and he is high risk because of his pulmonary disease. Since that time he is having increasing discomfort when he coughs or moves and he's felt a popping sensation sometimes.  denies nausea or vomiting     Morbidities include oxygen dependent COPD. 3 L of oxygen for 12 years. Not dyspneic at rest but only walks a few feet before he gets short of breath.Hypertension. BMI 23. BPH. History PAT in the 1980s. Takes an aspirin. Open appendectomy tonsillectomy. No anesthesia over 12 years      When I examined him recently his abdomen was soft but there was a hernia bulge in his left groin the size of a large egg. I could not reduce it while standing. I had to forcibly pop it back in when he was supine and he was a little bit tender when I did that. I think he is heading for an incarceration or strangulation. I told him I wanted to consider open repair of left  inguinal hernia with mesh under spinal anesthesia with exparel and TAPP nerve block and overnight observation. He agrees We will schedule surgery after he undergoes formal risk assessment with Dr. Baird Lyons and Dr. Sherren Mocha  Operative Findings:       The hernia was reduced at the time of surgery.  There was a large indirect sac which was dissected away and opened and found to be empty.  Palpation through the sac into the abdomen revealed a soft femoral artery and no evidence of femoral hernia.  The floor of the inguinal canal medially was a little bit thin but was not bulging.  Procedure in Detail:          Following left TA PP block and then spinal anesthesia the patient was placed supine.  He was monitored and sedated by the anesthesia department.  The lower abdomen and genitalia were prepped and draped in a sterile fashion.  Surgical timeout was performed.  Intravenous antibiotics were given.  0.5% Marcaine with epinephrine was used as local infiltration anesthetic as well.  A transverse incision was made in the left groin overlying the left inguinal canal.  Dissection was carried down through the subcutaneous tissue.  The aponeurosis of the external oblique was identified and incised in the direction of its fibers, opening up the external inguinal ring.  Self-retaining retractors were placed.  I dissected out the cord structures and encircled them with a Penrose drain.  The inguinal ligament was clearly defined.  I skeletonized the cremasteric muscle fibers off of the cord structures and then identified the large indirect sac.  This was dissected away from the surrounding tissues with cautery.  A couple of small vessels were tied off with Vicryl ties.  One sensory nerve was clamped divided and ligated with 2-0 silk tie.      Cremasteric muscle fibers were divided.  Cord structures were mobilized and skeletonized.  Indirect sac was identified and was dissected away from the surrounding  structures.  The indirect sac was opened and inspected and found to be empty.  The indirect sac was twisted and suture ligated at the level of the internal ring with a suture ligature of 2-0 Vicryl.  The redundant sac was transected and discarded.  There was no bleeding.  I used a 3 inch x 6 inch piece of ultra pro mesh.  I trimmed it at the corners to fit the wound.  The mesh was sutured in place with running sutures of 2-0 Prolene and interrupted mattress sutures of 2-0 Prolene.  The mesh was sutured so as to generously overlap the fascia at the pubic tubercle, then along the inguinal ligament inferiorly.  Medially, superiorly, and superior laterally several mattress sutures of Prolene were placed.  The mesh was incised laterally so as to wrap around the cord structures at the internal ring.  Further sutures were placed through the mesh and the muscle laterally.  This provided very secure coverage and repair both medial and lateral to the internal ring but allowed an adequate fingertip opening for the cord structures.  The wound was irrigated.  The repair was inspected.     The external oblique was closed with a running suture of 2-0 Vicryl.  Scarpa's fascia was closed with 3-0 Vicryl sutures and the skin closed with a running subcuticular 4-0 Monocryl and Dermabond.  The patient tolerated the procedure well and was taken to PACU in stable condition.  EBL 10 cc.  Counts correct.  Complications none.   Addendum: I logged onto the PMP aware website and reviewed his prescription medication history     Nikyla Navedo [name], M.D., FACS General and Minimally Invasive Surgery Breast and Colorectal Surgery  02/07/2019 8:51 AM

## 2019-02-07 NOTE — Anesthesia Procedure Notes (Signed)
Procedure Name: MAC Date/Time: 02/07/2019 7:59 AM Performed by: Kathryne Hitch, CRNA Pre-anesthesia Checklist: Patient identified, Emergency Drugs available, Suction available, Patient being monitored and Timeout performed Patient Re-evaluated:Patient Re-evaluated prior to induction Oxygen Delivery Method: Nasal cannula Preoxygenation: Pre-oxygenation with 100% oxygen Induction Type: IV induction Dental Injury: Teeth and Oropharynx as per pre-operative assessment

## 2019-02-08 ENCOUNTER — Encounter (HOSPITAL_COMMUNITY): Payer: Self-pay | Admitting: General Surgery

## 2019-02-08 ENCOUNTER — Ambulatory Visit (HOSPITAL_COMMUNITY): Payer: Medicare Other

## 2019-02-08 DIAGNOSIS — J449 Chronic obstructive pulmonary disease, unspecified: Secondary | ICD-10-CM | POA: Diagnosis not present

## 2019-02-08 DIAGNOSIS — I2510 Atherosclerotic heart disease of native coronary artery without angina pectoris: Secondary | ICD-10-CM | POA: Diagnosis not present

## 2019-02-08 DIAGNOSIS — Z9981 Dependence on supplemental oxygen: Secondary | ICD-10-CM | POA: Diagnosis not present

## 2019-02-08 DIAGNOSIS — K4090 Unilateral inguinal hernia, without obstruction or gangrene, not specified as recurrent: Secondary | ICD-10-CM | POA: Diagnosis not present

## 2019-02-08 DIAGNOSIS — I471 Supraventricular tachycardia: Secondary | ICD-10-CM | POA: Diagnosis not present

## 2019-02-08 DIAGNOSIS — I10 Essential (primary) hypertension: Secondary | ICD-10-CM | POA: Diagnosis not present

## 2019-02-08 MED ORDER — HYDROCODONE-ACETAMINOPHEN 5-325 MG PO TABS: 1.0000 | ORAL_TABLET | Freq: Four times a day (QID) | ORAL | 0 refills | Status: DC | PRN

## 2019-02-08 NOTE — Discharge Summary (Signed)
Patient ID: [id number] FURRY [id number] 84 y.o. 1934-12-15  Admit date: 02/07/2019  Discharge date and time: 02/08/2019  Admitting Physician: Adin Hector  Discharge Physician: Adin Hector  Admission Diagnoses: LEFT INGUINAL HERNIA  Discharge Diagnoses: Left inguinal hernia                                         COPD, severe                                          Oxygen dependent                                          History of paroxysmal atrial tachycardia                                          Benign prostatic hypertrophy                                          Hypertension, essential, benign  Operations: Procedure(s): OPEN REPAIR LEFT INGUINAL HERNIA WITH MESH  Admission Condition: fair  Discharged Condition: fair  Indication for Admission:  This is an 84 year old gentleman who returns with progressive symptoms related to his left inguinal hernia.  His PCP is Lennette Bihari Little. Baird Lyons is his pulmonologist. He sees Sherren Mocha because of coronary disease and remote history of paroxysmal atrial tachycardia. He has oxygen-dependent COPD He noticed a left inguinal hernia in October last year after coughing a lot when he was treated for pneumonia. He was on antibiotics for 1 month and constipated. Noticed a bulge but really wasn't having any pain I saw him 1 month ago and we decided to hold off because his symptoms were minimal and the hernia was soft and easily reducible and he is high risk because of his pulmonary disease. Since that time he is having increasing discomfort when he coughs or moves and he's felt a popping sensation sometimes.  denies nausea or vomiting Morbidities include oxygen dependent COPD. 3 L of oxygen for 12 years. Not dyspneic at rest but only walks a few feet before he gets short of breath.Hypertension. BMI 23. BPH. History PAT in the 1980s. Takes an aspirin. Open appendectomy tonsillectomy. No anesthesia over 12  years When I examined him recently his abdomen was soft but there was a hernia bulge in his left groin the size of a large egg. I could not reduce it while standing. I had to forcibly pop it back in when he was supine and he was a little bit tender when I did that. I think he is heading for an incarceration or strangulation. I told him I wanted to consider open repair of left inguinal hernia with mesh under spinal anesthesia with exparel and TAPP nerve block and overnight observation. He agrees He is brought to the operating room electively  Hospital Course: On the day of admission the patient underwent open repair of left inguinal hernia with mesh.  The  surgical procedure was uneventful.  Anesthesia was spinal, supplemented by TA PP block and local infiltration.  Postoperatively the patient was observed overnight for safety and he did very well.  He was able to ambulate to the bathroom.  Voiding without difficulty.  Tolerating diet.  Minimal pain.  Examination of postop day 1 revealed that the abdomen was soft.  Left groin wound looked good.  No hematoma or scrotal swelling.  He had no dyspnea.  SPO2 100% on nasal supplementation.  Color good.  He wanted to go home. Diet and activities were discussed.  He was advised to take high-dose Tylenol and given instructions for that.  I did call in a prescription for 15 tablets of Norco to his pharmacy just in case he needs it.  He was told to double up on his constipation medications.  Stretching and ambulation was encouraged.  I will see him back in the office in 3 weeks.  Consults: None  Significant Diagnostic Studies: none  Treatments: surgery: Open repair left inguinal hernia with mesh  Disposition: Home  Patient Instructions:  Allergies as of 02/08/2019      Reactions   Tolectin [tolmetin Sodium] Other (See Comments)   BP low and passed out   Montelukast Sodium Other (See Comments)   Unknown rxn   Augmentin [amoxicillin-pot Clavulanate]  Nausea And Vomiting   Did it involve swelling of the face/tongue/throat, SOB, or low BP? No Did it involve sudden or severe rash/hives, skin peeling, or any reaction on the inside of your mouth or nose? No Did you need to seek medical attention at a hospital or doctor's office? No When did it last happen?Last year If all above answers are "NO", may proceed with cephalosporin use.   Neosporin [bacitracin-polymyxin B] Rash      Medication List    TAKE these medications   albuterol 108 (90 Base) MCG/ACT inhaler Commonly known as: ProAir HFA Inhale 2 puffs into the lungs 4 (four) times daily as needed for wheezing or shortness of breath.   aspirin EC 81 MG tablet Take 1 tablet (81 mg total) by mouth daily.   digoxin 0.25 MG tablet Commonly known as: LANOXIN Take 0.25 mg by mouth daily.   Ensure High Protein Liqd Take 1 Bottle by mouth daily.   Flutter Devi Use as directed   HYDROcodone-acetaminophen 5-325 MG tablet Commonly known as: Norco Take 1 tablet by mouth every 6 (six) hours as needed for moderate pain or severe pain.   ipratropium-albuterol 0.5-2.5 (3) MG/3ML Soln Commonly known as: DUONEB Take 3 mLs by nebulization every 6 (six) hours as needed. What changed: reasons to take this   losartan 25 MG tablet Commonly known as: COZAAR TAKE 1 TABLET(25 MG) BY MOUTH DAILY What changed: See the new instructions.   OXYGEN Inhale 3-5 L into the lungs continuous.   polyethylene glycol powder 17 GM/SCOOP powder Commonly known as: GLYCOLAX/MIRALAX Take 17 g by mouth daily.   pravastatin 40 MG tablet Commonly known as: PRAVACHOL Take 40 mg by mouth daily.   Spiriva Respimat 2.5 MCG/ACT Aers Generic drug: Tiotropium Bromide Monohydrate INHALE 2 PUFFS BY MOUTH EVERY DAY What changed: See the new instructions.   Symbicort 160-4.5 MCG/ACT inhaler Generic drug: budesonide-formoterol INHALE 2 PUFFS BY MOUTH TWICE DAILY. RINSE MOUTH What changed: See the new  instructions.   tamsulosin 0.4 MG Caps capsule Commonly known as: FLOMAX Take 0.4 mg by mouth daily.            Discharge Care  Instructions  (From admission, onward)         Start     Ordered   02/08/19 0000  Discharge wound care:    Comments: There is no specific wound care You may shower but do not take a tub bath The clear plastic superglue will wear off in 2 to 3 weeks   02/08/19 0651          Activity: No heavy lifting for 5 to 6 weeks.  Lots of ambulation.  Okay to drive in 7 to 10 days Diet: low fat, low cholesterol diet Wound Care: as directed  Follow-up:  With Dr. Dalbert Batman in 3 weeks    Addendum: I logged onto the PMP aware website and reviewed his prescription medication history.  Signed: Edsel Petrin. Dalbert Batman, M.D., FACS General and minimally invasive surgery Breast and Colorectal Surgery  02/08/2019, 6:53 AM

## 2019-02-08 NOTE — Plan of Care (Signed)

## 2019-02-08 NOTE — Progress Notes (Signed)
Pt given discharge instructions and gone over with him. Pt verbalized understanding of instructions; all questions answered. All belongings gathered to be sent home.

## 2019-02-13 ENCOUNTER — Ambulatory Visit (HOSPITAL_COMMUNITY): Payer: Medicare Other

## 2019-02-14 DIAGNOSIS — E778 Other disorders of glycoprotein metabolism: Secondary | ICD-10-CM | POA: Diagnosis not present

## 2019-02-14 DIAGNOSIS — Z8679 Personal history of other diseases of the circulatory system: Secondary | ICD-10-CM | POA: Diagnosis not present

## 2019-02-14 DIAGNOSIS — I25119 Atherosclerotic heart disease of native coronary artery with unspecified angina pectoris: Secondary | ICD-10-CM | POA: Diagnosis not present

## 2019-02-14 DIAGNOSIS — I10 Essential (primary) hypertension: Secondary | ICD-10-CM | POA: Diagnosis not present

## 2019-02-14 DIAGNOSIS — J449 Chronic obstructive pulmonary disease, unspecified: Secondary | ICD-10-CM | POA: Diagnosis not present

## 2019-02-14 DIAGNOSIS — K4090 Unilateral inguinal hernia, without obstruction or gangrene, not specified as recurrent: Secondary | ICD-10-CM | POA: Diagnosis not present

## 2019-02-14 DIAGNOSIS — E7800 Pure hypercholesterolemia, unspecified: Secondary | ICD-10-CM | POA: Diagnosis not present

## 2019-02-14 DIAGNOSIS — N401 Enlarged prostate with lower urinary tract symptoms: Secondary | ICD-10-CM | POA: Diagnosis not present

## 2019-02-15 ENCOUNTER — Ambulatory Visit (HOSPITAL_COMMUNITY): Payer: Medicare Other

## 2019-02-20 ENCOUNTER — Ambulatory Visit (HOSPITAL_COMMUNITY): Payer: Medicare Other

## 2019-02-21 DIAGNOSIS — Z8679 Personal history of other diseases of the circulatory system: Secondary | ICD-10-CM | POA: Diagnosis not present

## 2019-02-21 DIAGNOSIS — E7800 Pure hypercholesterolemia, unspecified: Secondary | ICD-10-CM | POA: Diagnosis not present

## 2019-02-21 DIAGNOSIS — K4090 Unilateral inguinal hernia, without obstruction or gangrene, not specified as recurrent: Secondary | ICD-10-CM | POA: Diagnosis not present

## 2019-02-21 DIAGNOSIS — I25119 Atherosclerotic heart disease of native coronary artery with unspecified angina pectoris: Secondary | ICD-10-CM | POA: Diagnosis not present

## 2019-02-21 DIAGNOSIS — J449 Chronic obstructive pulmonary disease, unspecified: Secondary | ICD-10-CM | POA: Diagnosis not present

## 2019-02-21 DIAGNOSIS — N401 Enlarged prostate with lower urinary tract symptoms: Secondary | ICD-10-CM | POA: Diagnosis not present

## 2019-02-21 DIAGNOSIS — E778 Other disorders of glycoprotein metabolism: Secondary | ICD-10-CM | POA: Diagnosis not present

## 2019-02-21 DIAGNOSIS — I10 Essential (primary) hypertension: Secondary | ICD-10-CM | POA: Diagnosis not present

## 2019-02-22 ENCOUNTER — Ambulatory Visit (HOSPITAL_COMMUNITY): Payer: Medicare Other

## 2019-02-27 ENCOUNTER — Ambulatory Visit (HOSPITAL_COMMUNITY): Payer: Medicare Other

## 2019-02-28 ENCOUNTER — Encounter (HOSPITAL_COMMUNITY): Payer: Self-pay | Admitting: Emergency Medicine

## 2019-02-28 ENCOUNTER — Emergency Department (HOSPITAL_COMMUNITY): Payer: Medicare Other

## 2019-02-28 ENCOUNTER — Emergency Department (HOSPITAL_COMMUNITY)
Admission: EM | Admit: 2019-02-28 | Discharge: 2019-02-28 | Disposition: A | Discharge status: Home / Self Care | Payer: Medicare Other | Attending: Emergency Medicine | Admitting: Emergency Medicine

## 2019-02-28 DIAGNOSIS — Y998 Other external cause status: Secondary | ICD-10-CM | POA: Diagnosis not present

## 2019-02-28 DIAGNOSIS — S51019A Laceration without foreign body of unspecified elbow, initial encounter: Secondary | ICD-10-CM

## 2019-02-28 DIAGNOSIS — S51011A Laceration without foreign body of right elbow, initial encounter: Secondary | ICD-10-CM | POA: Diagnosis not present

## 2019-02-28 DIAGNOSIS — W19XXXA Unspecified fall, initial encounter: Secondary | ICD-10-CM | POA: Diagnosis not present

## 2019-02-28 DIAGNOSIS — Z79899 Other long term (current) drug therapy: Secondary | ICD-10-CM | POA: Insufficient documentation

## 2019-02-28 DIAGNOSIS — S3992XA Unspecified injury of lower back, initial encounter: Secondary | ICD-10-CM | POA: Diagnosis present

## 2019-02-28 DIAGNOSIS — J189 Pneumonia, unspecified organism: Secondary | ICD-10-CM | POA: Diagnosis not present

## 2019-02-28 DIAGNOSIS — Y92009 Unspecified place in unspecified non-institutional (private) residence as the place of occurrence of the external cause: Secondary | ICD-10-CM | POA: Diagnosis not present

## 2019-02-28 DIAGNOSIS — I2510 Atherosclerotic heart disease of native coronary artery without angina pectoris: Secondary | ICD-10-CM | POA: Insufficient documentation

## 2019-02-28 DIAGNOSIS — Z87891 Personal history of nicotine dependence: Secondary | ICD-10-CM | POA: Diagnosis not present

## 2019-02-28 DIAGNOSIS — S299XXA Unspecified injury of thorax, initial encounter: Secondary | ICD-10-CM | POA: Diagnosis not present

## 2019-02-28 DIAGNOSIS — S32010A Wedge compression fracture of first lumbar vertebra, initial encounter for closed fracture: Secondary | ICD-10-CM | POA: Insufficient documentation

## 2019-02-28 DIAGNOSIS — J449 Chronic obstructive pulmonary disease, unspecified: Secondary | ICD-10-CM | POA: Diagnosis not present

## 2019-02-28 DIAGNOSIS — Z7982 Long term (current) use of aspirin: Secondary | ICD-10-CM | POA: Diagnosis not present

## 2019-02-28 DIAGNOSIS — S51012A Laceration without foreign body of left elbow, initial encounter: Secondary | ICD-10-CM | POA: Insufficient documentation

## 2019-02-28 DIAGNOSIS — I10 Essential (primary) hypertension: Secondary | ICD-10-CM | POA: Insufficient documentation

## 2019-02-28 DIAGNOSIS — W0110XA Fall on same level from slipping, tripping and stumbling with subsequent striking against unspecified object, initial encounter: Secondary | ICD-10-CM | POA: Diagnosis not present

## 2019-02-28 DIAGNOSIS — M545 Low back pain: Secondary | ICD-10-CM | POA: Diagnosis not present

## 2019-02-28 DIAGNOSIS — Y9389 Activity, other specified: Secondary | ICD-10-CM | POA: Insufficient documentation

## 2019-02-28 DIAGNOSIS — M5489 Other dorsalgia: Secondary | ICD-10-CM | POA: Diagnosis not present

## 2019-02-28 DIAGNOSIS — R52 Pain, unspecified: Secondary | ICD-10-CM | POA: Diagnosis not present

## 2019-02-28 DIAGNOSIS — R0602 Shortness of breath: Secondary | ICD-10-CM | POA: Diagnosis not present

## 2019-02-28 LAB — COMPREHENSIVE METABOLIC PANEL
ALT: 17 U/L (ref 0–44)
AST: 21 U/L (ref 15–41)
Albumin: 3.5 g/dL (ref 3.5–5.0)
Alkaline Phosphatase: 43 U/L (ref 38–126)
Anion gap: 10 (ref 5–15)
BUN: 11 mg/dL (ref 8–23)
CO2: 27 mmol/L (ref 22–32)
Calcium: 9 mg/dL (ref 8.9–10.3)
Chloride: 97 mmol/L — ABNORMAL LOW (ref 98–111)
Creatinine, Ser: 0.54 mg/dL — ABNORMAL LOW (ref 0.61–1.24)
GFR calc Af Amer: >60 mL/min (ref >60)
GFR calc non Af Amer: >60 mL/min (ref >60)
Glucose, Bld: 94 mg/dL (ref 70–99)
Potassium: 4.5 mmol/L (ref 3.5–5.1)
Sodium: 134 mmol/L — ABNORMAL LOW (ref 135–145)
Total Bilirubin: 1.2 mg/dL (ref 0.3–1.2)
Total Protein: 6.2 g/dL — ABNORMAL LOW (ref 6.5–8.1)

## 2019-02-28 LAB — CBC WITH DIFFERENTIAL/PLATELET
Abs Immature Granulocytes: 0.08 10*3/uL — ABNORMAL HIGH (ref 0.00–0.07)
Basophils Absolute: 0 10*3/uL (ref 0.0–0.1)
Basophils Relative: 0 %
Eosinophils Absolute: 0.3 10*3/uL (ref 0.0–0.5)
Eosinophils Relative: 3 %
HCT: 38.9 % — ABNORMAL LOW (ref 39.0–52.0)
Hemoglobin: 12.4 g/dL — ABNORMAL LOW (ref 13.0–17.0)
Immature Granulocytes: 1 %
Lymphocytes Relative: 7 %
Lymphs Abs: 0.7 10*3/uL (ref 0.7–4.0)
MCH: 28.8 pg (ref 26.0–34.0)
MCHC: 31.9 g/dL (ref 30.0–36.0)
MCV: 90.5 fL (ref 80.0–100.0)
Monocytes Absolute: 0.7 10*3/uL (ref 0.1–1.0)
Monocytes Relative: 7 %
Neutro Abs: 8.1 10*3/uL — ABNORMAL HIGH (ref 1.7–7.7)
Neutrophils Relative %: 82 %
Platelets: 209 10*3/uL (ref 150–400)
RBC: 4.3 MIL/uL (ref 4.22–5.81)
RDW: 13.5 % (ref 11.5–15.5)
WBC: 9.9 10*3/uL (ref 4.0–10.5)
nRBC: 0 % (ref 0.0–0.2)

## 2019-02-28 LAB — D-DIMER, QUANTITATIVE: D-Dimer, Quant: >20 ug/mL-FEU — ABNORMAL HIGH (ref 0.00–0.50)

## 2019-02-28 LAB — URINALYSIS, ROUTINE W REFLEX MICROSCOPIC
Bilirubin Urine: NEGATIVE
Glucose, UA: NEGATIVE mg/dL
Hgb urine dipstick: NEGATIVE
Ketones, ur: 20 mg/dL — AB
Leukocytes,Ua: NEGATIVE
Nitrite: NEGATIVE
Protein, ur: NEGATIVE mg/dL
Specific Gravity, Urine: 1.014 (ref 1.005–1.030)
pH: 5 (ref 5.0–8.0)

## 2019-02-28 MED ORDER — DOXYCYCLINE HYCLATE 100 MG PO CAPS: 100.0000 mg | ORAL_CAPSULE | Freq: Two times a day (BID) | ORAL | 0 refills | Status: DC

## 2019-02-28 MED ORDER — HYDROCODONE-ACETAMINOPHEN 5-325 MG PO TABS
2.0000 | ORAL_TABLET | Freq: Once | ORAL | Status: AC
  Administered 2019-02-28: 2 via ORAL
  Filled 2019-02-28: qty 2

## 2019-02-28 MED ORDER — ALBUTEROL SULFATE HFA 108 (90 BASE) MCG/ACT IN AERS
2.0000 | INHALATION_SPRAY | RESPIRATORY_TRACT | Status: AC
  Administered 2019-02-28: 2 via RESPIRATORY_TRACT
  Filled 2019-02-28: qty 6.7

## 2019-02-28 MED ORDER — IOHEXOL 350 MG/ML SOLN
75.0000 mL | Freq: Once | INTRAVENOUS | Status: AC | PRN
  Administered 2019-02-28: 75 mL via INTRAVENOUS

## 2019-02-28 MED ORDER — ONDANSETRON 4 MG PO TBDP: 4.0000 mg | ORAL_TABLET | Freq: Three times a day (TID) | ORAL | 0 refills | Status: DC | PRN

## 2019-02-28 MED ORDER — ONDANSETRON HCL 4 MG/2ML IJ SOLN
4.0000 mg | Freq: Once | INTRAMUSCULAR | Status: AC
  Administered 2019-02-28: 4 mg via INTRAVENOUS
  Filled 2019-02-28: qty 2

## 2019-02-28 MED ORDER — HYDROCODONE-ACETAMINOPHEN 5-325 MG PO TABS: 1.0000 | ORAL_TABLET | Freq: Four times a day (QID) | ORAL | 0 refills | Status: DC | PRN

## 2019-02-28 MED ORDER — MORPHINE SULFATE (PF) 4 MG/ML IV SOLN
4.0000 mg | Freq: Once | INTRAVENOUS | Status: AC
  Administered 2019-02-28: 4 mg via INTRAVENOUS
  Filled 2019-02-28: qty 1

## 2019-02-28 MED ORDER — DOXYCYCLINE HYCLATE 100 MG PO TABS
100.0000 mg | ORAL_TABLET | Freq: Once | ORAL | Status: AC
  Administered 2019-02-28: 100 mg via ORAL
  Filled 2019-02-28: qty 1

## 2019-02-28 MED ORDER — ONDANSETRON 4 MG PO TBDP
4.0000 mg | ORAL_TABLET | Freq: Once | ORAL | Status: AC
  Administered 2019-02-28: 4 mg via ORAL
  Filled 2019-02-28: qty 1

## 2019-02-28 NOTE — ED Provider Notes (Signed)
[facility] EMERGENCY DEPARTMENT Provider Note   CSN: [id number] Arrival date & time: 02/28/19  2263    History   Chief Complaint Chief Complaint  Patient presents with  . Fall    HPI [name] is a 84 y.o. male.     HPI  The patient is an 84 year old male, he has a known history of emphysema, he has not smoked in approximately 25 to 30 years.  He reports a history of needing oxygen and currently is up to around 4 L/min by nasal cannula.  He reports that he was up this morning walking to the bathroom when he fell onto his lower back.  He does not believe that he hit his head and had no loss of consciousness.  He denies any headache or neck pain and denies any fevers chills nausea vomiting or diarrhea, there is been no blood in the stools or diarrhea or dysuria.  He does report having some increasing shortness of breath recently despite taking his daily medications for COPD.  Paramedics found the patient to be normotensive, vital signs were unremarkable, they transported the patient to the hospital for further evaluation secondary to the fall.  They do not report any abnormal vital signs.  The patient's medical record was reviewed by myself showing that he had been admitted to the hospital several weeks ago for a left inguinal hernia repair.  He followed up with his general surgeon yesterday and everything was going well.  He denies any increasing pain or complications of the surgery.  No bleeding or discharge.  Past Medical History:  Diagnosis Date  . Allergic rhinitis, cause unspecified   . Arthritis   . COPD (chronic obstructive pulmonary disease) ([place])   . Dyspnea   . Dysrhythmia    "1 episode of tachycardia in 1980's, been on it ever since"  . Emphysema of lung ([place])   . Hypertension   . Left inguinal hernia 02/07/2019  . Neuromuscular disorder ([place])   . Neuropathy    feet  . Pneumonia    History of, last Oct 2019  . Pulmonary nodule      Patient Active Problem List   Diagnosis Date Noted  . Left inguinal hernia 02/07/2019  . Left leg pain 05/18/2018  . Pneumonia 05/18/2018  . Chronic respiratory failure with hypoxia ([place]) 01/12/2015  . COPD with acute exacerbation ([place]) 01/21/2013  . Hyperlipidemia 12/01/2011  . Hypertension 12/01/2011  . CAD (coronary artery disease) 05/23/2011  . Lung nodule 09/16/2008  . ALLERGIC RHINITIS 07/24/2007  . COPD mixed type ([place]) 07/24/2007  . BURSITIS 07/24/2007    Past Surgical History:  Procedure Laterality Date  . APPENDECTOMY    . CARDIAC CATHETERIZATION  2003   performed at [facility], Dr. Fransico Him  . CATARACT EXTRACTION, BILATERAL    . EYE SURGERY    . INGUINAL HERNIA REPAIR Left 02/07/2019   Procedure: OPEN REPAIR LEFT INGUINAL HERNIA WITH MESH;  Surgeon: Fanny Skates, MD;  Location: [place];  Service: General;  Laterality: Left;  SPINAL AND TAP BLOCK ANESTHESIA  . ROTATOR CUFF REPAIR    . TONSILLECTOMY    . VASECTOMY          Home Medications    Prior to Admission medications   Medication Sig Start Date End Date Taking? Authorizing Provider  albuterol (PROAIR HFA) 108 (90 BASE) MCG/ACT inhaler Inhale 2 puffs into the lungs 4 (four) times daily as needed for wheezing or shortness of breath. 11/08/12  Yes Baird Lyons D, MD  aspirin EC 81 MG tablet Take 1 tablet (81 mg total) by mouth daily. 05/27/15  Yes Sherren Mocha, MD  digoxin (LANOXIN) 0.25 MG tablet Take 0.25 mg by mouth daily.    Yes [provider]  ipratropium-albuterol (DUONEB) 0.5-2.5 (3) MG/3ML SOLN Take 3 mLs by nebulization every 6 (six) hours as needed. Patient taking differently: Take 3 mLs by nebulization every 6 (six) hours as needed (shortness of breath).  10/12/18  Yes Young, Tarri Fuller D, MD  ketoconazole (NIZORAL) 2 % shampoo Apply 1 application topically 2 (two) times a week. 12/26/18  Yes [provider]  losartan (COZAAR) 25 MG tablet TAKE 1 TABLET(25 MG) BY MOUTH DAILY Patient  taking differently: Take 25 mg by mouth daily.  08/14/18  Yes Sherren Mocha, MD  Nutritional Supplements (ENSURE HIGH PROTEIN) LIQD Take 1 Bottle by mouth daily.   Yes [provider]  OXYGEN Inhale 3-5 L into the lungs continuous.    Yes [provider]  polyethylene glycol powder (GLYCOLAX/MIRALAX) powder Take 17 g by mouth daily.   Yes [provider]  pravastatin (PRAVACHOL) 40 MG tablet Take 40 mg by mouth daily.  11/19/10  Yes [provider]  Respiratory Therapy Supplies (FLUTTER) DEVI Use as directed 05/23/18  Yes Parrett, Tammy S, NP  SPIRIVA RESPIMAT 2.5 MCG/ACT AERS INHALE 2 PUFFS BY MOUTH EVERY DAY Patient taking differently: Inhale 2 puffs into the lungs daily.  11/05/16  Yes Young, Kasandra Knudsen, MD  SYMBICORT 160-4.5 MCG/ACT inhaler INHALE 2 PUFFS BY MOUTH TWICE DAILY. RINSE MOUTH Patient taking differently: Inhale 2 puffs into the lungs 2 (two) times a day.  11/17/18  Yes Young, Tarri Fuller D, MD  tamsulosin (FLOMAX) 0.4 MG CAPS capsule Take 0.4 mg by mouth daily.  10/12/15  Yes [provider]  doxycycline (VIBRAMYCIN) 100 MG capsule Take 1 capsule (100 mg total) by mouth 2 (two) times daily. 02/28/19   Noemi Chapel, MD  HYDROcodone-acetaminophen (NORCO/VICODIN) 5-325 MG tablet Take 1 tablet by mouth every 6 (six) hours as needed. 02/28/19   Noemi Chapel, MD  ondansetron (ZOFRAN ODT) 4 MG disintegrating tablet Take 1 tablet (4 mg total) by mouth every 8 (eight) hours as needed for nausea. 02/28/19   Noemi Chapel, MD    Family History Family History  Problem Relation Age of Onset  . COPD Mother   . Stroke Mother   . Stroke Father   . Coronary artery disease Other        [place]  . COPD Sister     Social History Social History   Tobacco Use  . Smoking status: Former Smoker    Quit date: 08/16/1990    Years since quitting: 28.5  . Smokeless tobacco: Never Used  Substance Use Topics  . Alcohol use: Yes    Alcohol/week: 14.0 standard drinks     Types: 14 Shots of liquor per week    Comment: everyday vodka 2 drink a day  . Drug use: No     Allergies   Tolectin [tolmetin sodium], Montelukast sodium, Nsaids, Augmentin [amoxicillin-pot clavulanate], and Neosporin [bacitracin-polymyxin b]   Review of Systems Review of Systems  All other systems reviewed and are negative.    Physical Exam Updated Vital Signs BP (!) 149/72   Pulse 90   Temp 98.4 F (36.9 C) (Oral)   Resp 14   Ht 1.727 m (5\' 8" )   Wt 68 kg   SpO2 99%   BMI 22.81 kg/m  Physical Exam Vitals signs and nursing note reviewed.  Constitutional:      General: He is not in acute distress.    Appearance: He is well-developed.  HENT:     Head: Normocephalic and atraumatic.     Comments: No malocclusion, raccoon eyes or battle sign.    Mouth/Throat:     Pharynx: No oropharyngeal exudate.  Eyes:     General: No scleral icterus.       Right eye: No discharge.        Left eye: No discharge.     Conjunctiva/sclera: Conjunctivae normal.     Pupils: Pupils are equal, round, and reactive to light.  Neck:     Musculoskeletal: Normal range of motion and neck supple.     Thyroid: No thyromegaly.     Vascular: No JVD.  Cardiovascular:     Rate and Rhythm: Normal rate and regular rhythm.     Heart sounds: Normal heart sounds. No murmur. No friction rub. No gallop.   Pulmonary:     Effort: Pulmonary effort is normal. No respiratory distress.     Breath sounds: Wheezing present. No rales.     Comments: The patient is speaking comfortably without any increased work of breathing.  Speaks in full sentences.  He wheezes on forced expiration only.  Otherwise good air movement, no tenderness over the chest wall Abdominal:     General: Bowel sounds are normal. There is no distension.     Palpations: Abdomen is soft. There is no mass.     Tenderness: There is no abdominal tenderness.  Musculoskeletal: Normal range of motion.        General: Tenderness present.      Comments: All 4 extremities with normal range of motion, he is able to straight leg raise bilaterally with normal strength, equal grips bilaterally, supple joints diffusely.  Compartments are diffusely soft.  He does have some tenderness over the lower back  Lymphadenopathy:     Cervical: No cervical adenopathy.  Skin:    General: Skin is warm and dry.     Findings: No erythema or rash.     Comments: Skin tears to bilateral elbows - no repairable lacerations  Neurological:     Mental Status: He is alert.     Coordination: Coordination normal.     Comments: Neurologic exam is completely unremarkable including speech coordination and strength, no facial droop  Psychiatric:        Behavior: Behavior normal.      ED Treatments / Results  Labs (all labs ordered are listed, but only abnormal results are displayed) Labs Reviewed  CBC WITH DIFFERENTIAL/PLATELET - Abnormal; Notable for the following components:      Result Value   Hemoglobin 12.4 (*)    HCT 38.9 (*)    Neutro Abs 8.1 (*)    Abs Immature Granulocytes 0.08 (*)    All other components within normal limits  COMPREHENSIVE METABOLIC PANEL - Abnormal; Notable for the following components:   Sodium 134 (*)    Chloride 97 (*)    Creatinine, Ser 0.54 (*)    Total Protein 6.2 (*)    All other components within normal limits  URINALYSIS, ROUTINE W REFLEX MICROSCOPIC - Abnormal; Notable for the following components:   Ketones, ur 20 (*)    All other components within normal limits  D-DIMER, QUANTITATIVE (NOT AT [facility]) - Abnormal; Notable for the following components:   D-Dimer, Quant >20.00 (*)    All other  components within normal limits    EKG None  Radiology Dg Chest 2 View  Result Date: 02/28/2019 CLINICAL DATA:  Shortness of breath. EXAM: CHEST - 2 VIEW COMPARISON:  Radiographs of October 12, 2018. FINDINGS: The heart size and mediastinal contours are within normal limits. Atherosclerosis of thoracic aorta is noted. No  pneumothorax or pleural effusion is noted. Right lung is clear. New left lingular opacity is noted concerning for atelectasis or infiltrate. The visualized skeletal structures are unremarkable. IMPRESSION: New left lingular opacity is noted concerning for atelectasis or infiltrate. Followup PA and lateral chest X-ray is recommended in 3-4 weeks following trial of antibiotic therapy to ensure resolution and exclude underlying malignancy. Aortic Atherosclerosis (ICD10-I70.0). Electronically Signed   By: Marijo Conception M.D.   On: 02/28/2019 09:17   Dg Lumbar Spine Complete  Result Date: 02/28/2019 CLINICAL DATA:  Lower back pain after fall today. EXAM: LUMBAR SPINE - COMPLETE 4+ VIEW COMPARISON:  Radiographs of January 28, 2015. FINDINGS: Stable minimal grade 1 retrolisthesis of L3-4 is noted secondary to severe degenerative disc disease at this level. Moderate degenerative disc disease is noted at L2-3 and L5-S1 with anterior osteophyte formation. Mild compression deformity of L1 vertebral body is noted most consistent with old fracture, but if patient is symptomatic in this area, MRI may be performed for further evaluation. Atherosclerosis abdominal aorta is noted. IMPRESSION: Multilevel degenerative disc disease is noted. Interval development of mild compression deformity of L1 vertebral body most consistent with old fracture, but MRI may be performed to evaluate for acute fracture if patient is symptomatic in this area. Aortic Atherosclerosis (ICD10-I70.0). Electronically Signed   By: Marijo Conception M.D.   On: 02/28/2019 09:13   Ct Angio Chest Pe W And/or Wo Contrast  Result Date: 02/28/2019 CLINICAL DATA:  Suspected PE, fall EXAM: CT ANGIOGRAPHY CHEST WITH CONTRAST TECHNIQUE: Multidetector CT imaging of the chest was performed using the standard protocol during bolus administration of intravenous contrast. Multiplanar CT image reconstructions and MIPs were obtained to evaluate the vascular anatomy.  CONTRAST:  72mL OMNIPAQUE IOHEXOL 350 MG/ML SOLN COMPARISON:  06/01/2018 FINDINGS: Cardiovascular: Satisfactory opacification of the pulmonary arteries to the segmental level. No evidence of pulmonary embolism. Aortic atherosclerosis. Mild cardiomegaly. Left coronary artery calcifications. No pericardial effusion. Mediastinum/Nodes: No enlarged mediastinal, hilar, or axillary lymph nodes. Thyroid gland, trachea, and esophagus demonstrate no significant findings. Lungs/Pleura: Severe emphysema. There is heterogeneous airspace opacity of the lingula (series 10, image 102). Stable, benign pulmonary nodule of the lateral right upper lobe (series 10, image 57). No pleural effusion or pneumothorax. Incidental fat containing right-sided Bochdalek's hernia. Upper Abdomen: No acute abnormality. Musculoskeletal: There is a new minimally displaced superior endplate fracture of the L1 vertebral body with adjacent retroperitoneal fat stranding and hematoma (series 12, image 103, series series 9, image 472). Review of the MIP images confirms the above findings. IMPRESSION: 1.  Negative examination for pulmonary embolism. 2. There is new heterogeneous airspace opacity of the lingula (series 10, image 102), likely infectious or inflammatory. Recommend follow-up CT in 3 months to exclude malignancy. 3. There is a new minimally displaced superior endplate fracture of the L1 vertebral body with adjacent retroperitoneal fat stranding and hematoma (series 12, image 103, series series 9, image 472). 4.  Emphysema. 5.  Coronary artery disease and aortic atherosclerosis. Electronically Signed   By: Eddie Candle M.D.   On: 02/28/2019 12:48    Procedures Procedures (including critical care time)  Medications Ordered in ED Medications  doxycycline (VIBRA-TABS) tablet 100 mg (has no administration in time range)  morphine 4 MG/ML injection 4 mg (has no administration in time range)  albuterol (VENTOLIN HFA) 108 (90 Base) MCG/ACT  inhaler 2 puff (2 puffs Inhalation Given 02/28/19 0809)  HYDROcodone-acetaminophen (NORCO/VICODIN) 5-325 MG per tablet 2 tablet (2 tablets Oral Given 02/28/19 0819)  ondansetron (ZOFRAN-ODT) disintegrating tablet 4 mg (4 mg Oral Given 02/28/19 0914)  iohexol (OMNIPAQUE) 350 MG/ML injection 75 mL (75 mLs Intravenous Contrast Given 02/28/19 1213)     Initial Impression / Assessment and Plan / ED Course  I have reviewed the triage vital signs and the nursing notes.  Pertinent labs & imaging results that were available during my care of the patient were reviewed by me and considered in my medical decision making (see chart for details).        The cause of the patient's fall is unclear, he seems to have lost his balance or become temporarily unsteady however at this time there is no focal neurologic deficits to suggest a stroke, he does have some lower back pain which is in some part chronic but seems to be worse today, no prior history of back surgery.  Will obtain an x-ray, give a dose of pain medication, check an EKG and some basic labs given recent surgical status.  He is not tachycardic or hypoxic to suggest pulmonary embolism.  I discussed the care with Dr. Earle Gell who has agreed to see the patient in the outpatient setting, recommends a lumbosacral corset which I have ordered.  The patient has been updated on his condition including the pneumonia, the lumbar compression fracture which is minimal and for which the patient should be able to go home.  He is aware that the pain medicine may make him nauseated and he should take it with food.  Will order a diet here while waiting for brace.  Otherwise the patient is stable for discharge, no signs of pulmonary embolism.  Final Clinical Impressions(s) / ED Diagnoses   Final diagnoses:  Compression fracture of L1 vertebra, initial encounter ([place])  Skin tear of elbow without complication, initial encounter  Community acquired pneumonia of left  lower lobe of lung [facility])    ED Discharge Orders         Ordered    HYDROcodone-acetaminophen (NORCO/VICODIN) 5-325 MG tablet  Every 6 hours PRN     02/28/19 1407    doxycycline (VIBRAMYCIN) 100 MG capsule  2 times daily     02/28/19 1411    ondansetron (ZOFRAN ODT) 4 MG disintegrating tablet  Every 8 hours PRN     02/28/19 1442           Noemi Chapel, MD 02/28/19 1443

## 2019-02-28 NOTE — TOC Initial Note (Signed)
Transition of Care [facility]) - Initial/Assessment Note    Patient Details  Name: [name] MRN: [id number] Date of Birth: February 16, 1935  Transition of Care [facility]) CM/SW Contact:    Laurena Slimmer, RN Phone Number: 02/28/2019, 7:44 PM  Clinical Narrative:                 Patient presented to the [facility] ED s/p fall patient has history of COPD on continuous home oxygen ED CM consulted by Kathlee Nations Nurse on [place] concerning recommendations for [facility] services. Reviewed record, Met with patient and  at bedside, Verified information with patient . Pt lives at home at home with wife primary care giver.Patient ambulates at home independently. Discussed the recommendation for Home Health services, patient and family agreeable with this disposition plan. Discussed HH services RN/PT/OT/HHA . Offered choice, provided [place] agency list, Patient selected [facility] services   Discussed DME patient states he will need a rolling walker order will be sent to [employer] and patient agrees to have some one pick it up tomorrow.  Verified current address and phone number with patient and family, [facility] Referral with start of care tomorrow called in to on-call nurse and  faxed to [phone] Fax confirmation received. Updated Kathlee Nations   RN on [facility] they are agreeable with discharge plan. Informed patient and family.   Expected Discharge Plan: [place]     Patient Goals and CMS Choice Patient states their goals for this hospitalization and ongoing recovery are:: "I want to feel better" CMS Medicare.gov Compare Post Acute Care list provided to:: Patient Choice offered to / list presented to : Patient  Expected Discharge Plan and Services Expected Discharge Plan: [place] Acute Care Choice: Home Health                             HH Arranged: RN, PT, OT [facility] Agency: [facility] ([place])   Time [place]: [id number] Representative spoke with at [place]: Anderson Malta the  on-call nurse  Prior Living Arrangements/Services       Do you feel safe going back to the place where you live?: Yes                                     Admission diagnosis:  fall Patient Active Problem List   Diagnosis Date Noted  . Left inguinal hernia 02/07/2019  . Left leg pain 05/18/2018  . Pneumonia 05/18/2018  . Chronic respiratory failure with hypoxia ([place]) 01/12/2015  . COPD with acute exacerbation ([place]) 01/21/2013  . Hyperlipidemia 12/01/2011  . Hypertension 12/01/2011  . CAD (coronary artery disease) 05/23/2011  . Lung nodule 09/16/2008  . ALLERGIC RHINITIS 07/24/2007  . COPD mixed type ([place]) 07/24/2007  . BURSITIS 07/24/2007   PCP:  Hulan Fess, MD Pharmacy:   [facility] [place], [place] [address] AT [address] [place] [address] Phone: [phone] Fax: [phone]     Social Determinants of Health (SDOH) Interventions    Readmission Risk Interventions No flowsheet data found.

## 2019-02-28 NOTE — Progress Notes (Signed)
Orthopedic Tech Progress Note Patient Details:  [name] 1935/08/05 [id number] Called in order to [facility] for corsett  Patient ID: [name], male   DOB: December 10, 1934, 84 y.o.   MRN: [id number]   [name] 02/28/2019, 2:25 PM

## 2019-02-28 NOTE — ED Notes (Addendum)
Spoke with Son Liliane Channel) [phone] Provide update on care. Family member verbalized understanding.  Liliane Channel states he is the best contact for updates. Not his mother. She has difficulty using the phone. Liliane Channel will keep her updated

## 2019-02-28 NOTE — Discharge Instructions (Addendum)
You may take the pain medication exactly such as Tylenol - twice a day.  If you have severe or worsening symptoms you can try hydrocodone 1 tablet every 6 hours however this may make you nauseated, Zofran has been prescribed to help with nausea.  Please be aware that if you do take hydrocodone not only may it make you nauseated but it may make you constipated as well so take a stool softener with it.  Doxycycline twice daily for small pneumonia  I discussed your care with the spinal surgeon, Dr. Arnoldo Morale, these fractures rarely need surgery, it may benefit you to wear this corset which we have obtained for you, Dr. Arnoldo Morale would like to see you in the office in the next 2 or 3 weeks.  You may be seen by any of the physicians at that office if Dr. Arnoldo Morale is not available.  Return to the emergency department for severe or worsening pain, swelling, numbness, weakness or difficulty urinating.

## 2019-02-28 NOTE — ED Notes (Signed)
Patient transported to CT 

## 2019-02-28 NOTE — ED Triage Notes (Signed)
Pt arrives by EMS with complaints of a fall around 0600 today. Pt states he has had episodes of mild headaches over the last few days. History of COPD. 4 liters O2 at home.

## 2019-02-28 NOTE — ED Notes (Signed)
Attempted to call wife to provide update. Did not answer and left message.

## 2019-03-01 ENCOUNTER — Ambulatory Visit (HOSPITAL_COMMUNITY): Payer: Medicare Other

## 2019-03-04 DIAGNOSIS — W19XXXD Unspecified fall, subsequent encounter: Secondary | ICD-10-CM | POA: Diagnosis not present

## 2019-03-04 DIAGNOSIS — I2510 Atherosclerotic heart disease of native coronary artery without angina pectoris: Secondary | ICD-10-CM | POA: Diagnosis not present

## 2019-03-04 DIAGNOSIS — J9611 Chronic respiratory failure with hypoxia: Secondary | ICD-10-CM | POA: Diagnosis not present

## 2019-03-04 DIAGNOSIS — J418 Mixed simple and mucopurulent chronic bronchitis: Secondary | ICD-10-CM | POA: Diagnosis not present

## 2019-03-04 DIAGNOSIS — S51002D Unspecified open wound of left elbow, subsequent encounter: Secondary | ICD-10-CM | POA: Diagnosis not present

## 2019-03-04 DIAGNOSIS — J181 Lobar pneumonia, unspecified organism: Secondary | ICD-10-CM | POA: Diagnosis not present

## 2019-03-04 DIAGNOSIS — S32010D Wedge compression fracture of first lumbar vertebra, subsequent encounter for fracture with routine healing: Secondary | ICD-10-CM | POA: Diagnosis not present

## 2019-03-04 DIAGNOSIS — J440 Chronic obstructive pulmonary disease with acute lower respiratory infection: Secondary | ICD-10-CM | POA: Diagnosis not present

## 2019-03-05 DIAGNOSIS — J181 Lobar pneumonia, unspecified organism: Secondary | ICD-10-CM | POA: Diagnosis not present

## 2019-03-05 DIAGNOSIS — W19XXXD Unspecified fall, subsequent encounter: Secondary | ICD-10-CM | POA: Diagnosis not present

## 2019-03-05 DIAGNOSIS — S32010D Wedge compression fracture of first lumbar vertebra, subsequent encounter for fracture with routine healing: Secondary | ICD-10-CM | POA: Diagnosis not present

## 2019-03-05 DIAGNOSIS — J418 Mixed simple and mucopurulent chronic bronchitis: Secondary | ICD-10-CM | POA: Diagnosis not present

## 2019-03-05 DIAGNOSIS — S51002D Unspecified open wound of left elbow, subsequent encounter: Secondary | ICD-10-CM | POA: Diagnosis not present

## 2019-03-05 DIAGNOSIS — J440 Chronic obstructive pulmonary disease with acute lower respiratory infection: Secondary | ICD-10-CM | POA: Diagnosis not present

## 2019-03-05 DIAGNOSIS — Z1159 Encounter for screening for other viral diseases: Secondary | ICD-10-CM | POA: Diagnosis not present

## 2019-03-06 ENCOUNTER — Telehealth: Payer: Self-pay | Admitting: Internal Medicine

## 2019-03-06 ENCOUNTER — Ambulatory Visit (HOSPITAL_COMMUNITY): Payer: Medicare Other

## 2019-03-06 DIAGNOSIS — J189 Pneumonia, unspecified organism: Secondary | ICD-10-CM

## 2019-03-06 NOTE — Telephone Encounter (Signed)
Spoke with patient. He stated that he had a fall last week and was seen in the ER. While in the ER, they performed a CXR and advised him that he appears that he has slight PNA. They prescribed Doxycycline 100mg  twice daily for 7 days for this.   Patient stated that he has had PNA before in the past and was prescribed Doxy and it did not help. He was then switched to Levaquin.   He wants Dr. Annamaria Boots to take a look at the CXR from last week.   Dr. Annamaria Boots, please advise. Thanks!

## 2019-03-06 NOTE — Telephone Encounter (Signed)
Returned call to patient with Dr. Janee Morn review of chest images and recommendations.  Patient states he has one more dose of doxycycline and will finish taking it.  He is concerned about 'being in the bed more' due to the back injury from fall.  Advised patient to practice deep breathing every hour or two while more immobile.  Also encouraged to use flutter value and increase fluid intake as patient states he has some greenish colored phlegm he coughs up.  Patient to use neb if he feels very congested and more short of breath.   Patient plans to come in for xray in about 3 weeks. Also has scheduled follow up with Dr. Annamaria Boots 03/21/19.  Patient advised order for CXR placed today and he can come in at his convenience in 3 weeks or can discuss further at visit with Dr. Annamaria Boots.  Advised if symptoms worsen or if not improving to keep [place] informed.  Patient acknowledged understanding. Nothing further needed.

## 2019-03-06 NOTE — Telephone Encounter (Signed)
I have reviewed the xray and CT images. This is probably a mild pneumonia in the left lung. It can take a while for the xray to clear even on good antibiotic. I recommend- finish antibiotic as prescribed Then we need to order a future outpatient CXR in 3 weeks for dx Community acquired pneumonia

## 2019-03-07 DIAGNOSIS — J181 Lobar pneumonia, unspecified organism: Secondary | ICD-10-CM | POA: Diagnosis not present

## 2019-03-07 DIAGNOSIS — J418 Mixed simple and mucopurulent chronic bronchitis: Secondary | ICD-10-CM | POA: Diagnosis not present

## 2019-03-07 DIAGNOSIS — W19XXXD Unspecified fall, subsequent encounter: Secondary | ICD-10-CM | POA: Diagnosis not present

## 2019-03-07 DIAGNOSIS — J440 Chronic obstructive pulmonary disease with acute lower respiratory infection: Secondary | ICD-10-CM | POA: Diagnosis not present

## 2019-03-07 DIAGNOSIS — S32010D Wedge compression fracture of first lumbar vertebra, subsequent encounter for fracture with routine healing: Secondary | ICD-10-CM | POA: Diagnosis not present

## 2019-03-07 DIAGNOSIS — S51002D Unspecified open wound of left elbow, subsequent encounter: Secondary | ICD-10-CM | POA: Diagnosis not present

## 2019-03-08 ENCOUNTER — Ambulatory Visit (HOSPITAL_COMMUNITY): Payer: Medicare Other

## 2019-03-08 DIAGNOSIS — S32010D Wedge compression fracture of first lumbar vertebra, subsequent encounter for fracture with routine healing: Secondary | ICD-10-CM | POA: Diagnosis not present

## 2019-03-08 DIAGNOSIS — J418 Mixed simple and mucopurulent chronic bronchitis: Secondary | ICD-10-CM | POA: Diagnosis not present

## 2019-03-08 DIAGNOSIS — S51002D Unspecified open wound of left elbow, subsequent encounter: Secondary | ICD-10-CM | POA: Diagnosis not present

## 2019-03-08 DIAGNOSIS — K5900 Constipation, unspecified: Secondary | ICD-10-CM | POA: Diagnosis not present

## 2019-03-08 DIAGNOSIS — R9389 Abnormal findings on diagnostic imaging of other specified body structures: Secondary | ICD-10-CM | POA: Diagnosis not present

## 2019-03-08 DIAGNOSIS — J440 Chronic obstructive pulmonary disease with acute lower respiratory infection: Secondary | ICD-10-CM | POA: Diagnosis not present

## 2019-03-08 DIAGNOSIS — W19XXXD Unspecified fall, subsequent encounter: Secondary | ICD-10-CM | POA: Diagnosis not present

## 2019-03-08 DIAGNOSIS — J181 Lobar pneumonia, unspecified organism: Secondary | ICD-10-CM | POA: Diagnosis not present

## 2019-03-09 DIAGNOSIS — J181 Lobar pneumonia, unspecified organism: Secondary | ICD-10-CM | POA: Diagnosis not present

## 2019-03-09 DIAGNOSIS — S51002D Unspecified open wound of left elbow, subsequent encounter: Secondary | ICD-10-CM | POA: Diagnosis not present

## 2019-03-09 DIAGNOSIS — S32010D Wedge compression fracture of first lumbar vertebra, subsequent encounter for fracture with routine healing: Secondary | ICD-10-CM | POA: Diagnosis not present

## 2019-03-09 DIAGNOSIS — J418 Mixed simple and mucopurulent chronic bronchitis: Secondary | ICD-10-CM | POA: Diagnosis not present

## 2019-03-09 DIAGNOSIS — W19XXXD Unspecified fall, subsequent encounter: Secondary | ICD-10-CM | POA: Diagnosis not present

## 2019-03-09 DIAGNOSIS — J440 Chronic obstructive pulmonary disease with acute lower respiratory infection: Secondary | ICD-10-CM | POA: Diagnosis not present

## 2019-03-12 DIAGNOSIS — S32010D Wedge compression fracture of first lumbar vertebra, subsequent encounter for fracture with routine healing: Secondary | ICD-10-CM | POA: Diagnosis not present

## 2019-03-12 DIAGNOSIS — W19XXXD Unspecified fall, subsequent encounter: Secondary | ICD-10-CM | POA: Diagnosis not present

## 2019-03-12 DIAGNOSIS — J418 Mixed simple and mucopurulent chronic bronchitis: Secondary | ICD-10-CM | POA: Diagnosis not present

## 2019-03-12 DIAGNOSIS — S51002D Unspecified open wound of left elbow, subsequent encounter: Secondary | ICD-10-CM | POA: Diagnosis not present

## 2019-03-12 DIAGNOSIS — J440 Chronic obstructive pulmonary disease with acute lower respiratory infection: Secondary | ICD-10-CM | POA: Diagnosis not present

## 2019-03-12 DIAGNOSIS — J181 Lobar pneumonia, unspecified organism: Secondary | ICD-10-CM | POA: Diagnosis not present

## 2019-03-13 ENCOUNTER — Ambulatory Visit (HOSPITAL_COMMUNITY): Payer: Medicare Other

## 2019-03-13 DIAGNOSIS — J440 Chronic obstructive pulmonary disease with acute lower respiratory infection: Secondary | ICD-10-CM | POA: Diagnosis not present

## 2019-03-13 DIAGNOSIS — S32010A Wedge compression fracture of first lumbar vertebra, initial encounter for closed fracture: Secondary | ICD-10-CM | POA: Diagnosis not present

## 2019-03-13 DIAGNOSIS — S32010D Wedge compression fracture of first lumbar vertebra, subsequent encounter for fracture with routine healing: Secondary | ICD-10-CM | POA: Diagnosis not present

## 2019-03-13 DIAGNOSIS — J181 Lobar pneumonia, unspecified organism: Secondary | ICD-10-CM | POA: Diagnosis not present

## 2019-03-13 DIAGNOSIS — J418 Mixed simple and mucopurulent chronic bronchitis: Secondary | ICD-10-CM | POA: Diagnosis not present

## 2019-03-13 DIAGNOSIS — S51002D Unspecified open wound of left elbow, subsequent encounter: Secondary | ICD-10-CM | POA: Diagnosis not present

## 2019-03-13 DIAGNOSIS — M542 Cervicalgia: Secondary | ICD-10-CM | POA: Diagnosis not present

## 2019-03-13 DIAGNOSIS — W19XXXD Unspecified fall, subsequent encounter: Secondary | ICD-10-CM | POA: Diagnosis not present

## 2019-03-14 DIAGNOSIS — W19XXXD Unspecified fall, subsequent encounter: Secondary | ICD-10-CM | POA: Diagnosis not present

## 2019-03-14 DIAGNOSIS — S32010D Wedge compression fracture of first lumbar vertebra, subsequent encounter for fracture with routine healing: Secondary | ICD-10-CM | POA: Diagnosis not present

## 2019-03-14 DIAGNOSIS — J440 Chronic obstructive pulmonary disease with acute lower respiratory infection: Secondary | ICD-10-CM | POA: Diagnosis not present

## 2019-03-14 DIAGNOSIS — S51002D Unspecified open wound of left elbow, subsequent encounter: Secondary | ICD-10-CM | POA: Diagnosis not present

## 2019-03-14 DIAGNOSIS — J418 Mixed simple and mucopurulent chronic bronchitis: Secondary | ICD-10-CM | POA: Diagnosis not present

## 2019-03-14 DIAGNOSIS — J181 Lobar pneumonia, unspecified organism: Secondary | ICD-10-CM | POA: Diagnosis not present

## 2019-03-15 ENCOUNTER — Ambulatory Visit (HOSPITAL_COMMUNITY): Payer: Medicare Other

## 2019-03-15 DIAGNOSIS — S32010D Wedge compression fracture of first lumbar vertebra, subsequent encounter for fracture with routine healing: Secondary | ICD-10-CM | POA: Diagnosis not present

## 2019-03-15 DIAGNOSIS — S51002D Unspecified open wound of left elbow, subsequent encounter: Secondary | ICD-10-CM | POA: Diagnosis not present

## 2019-03-15 DIAGNOSIS — W19XXXD Unspecified fall, subsequent encounter: Secondary | ICD-10-CM | POA: Diagnosis not present

## 2019-03-15 DIAGNOSIS — J440 Chronic obstructive pulmonary disease with acute lower respiratory infection: Secondary | ICD-10-CM | POA: Diagnosis not present

## 2019-03-15 DIAGNOSIS — J418 Mixed simple and mucopurulent chronic bronchitis: Secondary | ICD-10-CM | POA: Diagnosis not present

## 2019-03-15 DIAGNOSIS — J181 Lobar pneumonia, unspecified organism: Secondary | ICD-10-CM | POA: Diagnosis not present

## 2019-03-16 DIAGNOSIS — I2510 Atherosclerotic heart disease of native coronary artery without angina pectoris: Secondary | ICD-10-CM | POA: Diagnosis not present

## 2019-03-16 DIAGNOSIS — E43 Unspecified severe protein-calorie malnutrition: Secondary | ICD-10-CM | POA: Diagnosis not present

## 2019-03-16 DIAGNOSIS — Z7982 Long term (current) use of aspirin: Secondary | ICD-10-CM | POA: Diagnosis not present

## 2019-03-16 DIAGNOSIS — S51002D Unspecified open wound of left elbow, subsequent encounter: Secondary | ICD-10-CM | POA: Diagnosis not present

## 2019-03-16 DIAGNOSIS — M5136 Other intervertebral disc degeneration, lumbar region: Secondary | ICD-10-CM | POA: Diagnosis not present

## 2019-03-16 DIAGNOSIS — J189 Pneumonia, unspecified organism: Secondary | ICD-10-CM | POA: Diagnosis not present

## 2019-03-16 DIAGNOSIS — M810 Age-related osteoporosis without current pathological fracture: Secondary | ICD-10-CM | POA: Diagnosis not present

## 2019-03-16 DIAGNOSIS — M6281 Muscle weakness (generalized): Secondary | ICD-10-CM | POA: Diagnosis not present

## 2019-03-16 DIAGNOSIS — J440 Chronic obstructive pulmonary disease with acute lower respiratory infection: Secondary | ICD-10-CM | POA: Diagnosis not present

## 2019-03-16 DIAGNOSIS — K5900 Constipation, unspecified: Secondary | ICD-10-CM | POA: Diagnosis not present

## 2019-03-16 DIAGNOSIS — I499 Cardiac arrhythmia, unspecified: Secondary | ICD-10-CM | POA: Diagnosis not present

## 2019-03-16 DIAGNOSIS — Z111 Encounter for screening for respiratory tuberculosis: Secondary | ICD-10-CM | POA: Diagnosis not present

## 2019-03-16 DIAGNOSIS — E440 Moderate protein-calorie malnutrition: Secondary | ICD-10-CM | POA: Diagnosis not present

## 2019-03-16 DIAGNOSIS — G8929 Other chronic pain: Secondary | ICD-10-CM | POA: Diagnosis not present

## 2019-03-16 DIAGNOSIS — H04122 Dry eye syndrome of left lacrimal gland: Secondary | ICD-10-CM | POA: Diagnosis not present

## 2019-03-16 DIAGNOSIS — I10 Essential (primary) hypertension: Secondary | ICD-10-CM | POA: Diagnosis not present

## 2019-03-16 DIAGNOSIS — S32010D Wedge compression fracture of first lumbar vertebra, subsequent encounter for fracture with routine healing: Secondary | ICD-10-CM | POA: Diagnosis not present

## 2019-03-16 DIAGNOSIS — W19XXXD Unspecified fall, subsequent encounter: Secondary | ICD-10-CM | POA: Diagnosis not present

## 2019-03-16 DIAGNOSIS — I5022 Chronic systolic (congestive) heart failure: Secondary | ICD-10-CM | POA: Diagnosis not present

## 2019-03-16 DIAGNOSIS — H04129 Dry eye syndrome of unspecified lacrimal gland: Secondary | ICD-10-CM | POA: Diagnosis not present

## 2019-03-16 DIAGNOSIS — Z1159 Encounter for screening for other viral diseases: Secondary | ICD-10-CM | POA: Diagnosis not present

## 2019-03-16 DIAGNOSIS — J309 Allergic rhinitis, unspecified: Secondary | ICD-10-CM | POA: Diagnosis not present

## 2019-03-16 DIAGNOSIS — R52 Pain, unspecified: Secondary | ICD-10-CM | POA: Diagnosis not present

## 2019-03-16 DIAGNOSIS — M818 Other osteoporosis without current pathological fracture: Secondary | ICD-10-CM | POA: Diagnosis not present

## 2019-03-16 DIAGNOSIS — J9611 Chronic respiratory failure with hypoxia: Secondary | ICD-10-CM | POA: Diagnosis not present

## 2019-03-16 DIAGNOSIS — N400 Enlarged prostate without lower urinary tract symptoms: Secondary | ICD-10-CM | POA: Diagnosis not present

## 2019-03-16 DIAGNOSIS — E785 Hyperlipidemia, unspecified: Secondary | ICD-10-CM | POA: Diagnosis not present

## 2019-03-16 DIAGNOSIS — J418 Mixed simple and mucopurulent chronic bronchitis: Secondary | ICD-10-CM | POA: Diagnosis not present

## 2019-03-16 DIAGNOSIS — M4846XD Fatigue fracture of vertebra, lumbar region, subsequent encounter for fracture with routine healing: Secondary | ICD-10-CM | POA: Diagnosis not present

## 2019-03-16 DIAGNOSIS — R110 Nausea: Secondary | ICD-10-CM | POA: Diagnosis not present

## 2019-03-16 DIAGNOSIS — J181 Lobar pneumonia, unspecified organism: Secondary | ICD-10-CM | POA: Diagnosis not present

## 2019-03-16 DIAGNOSIS — J449 Chronic obstructive pulmonary disease, unspecified: Secondary | ICD-10-CM | POA: Diagnosis not present

## 2019-03-16 DIAGNOSIS — R2689 Other abnormalities of gait and mobility: Secondary | ICD-10-CM | POA: Diagnosis not present

## 2019-03-19 ENCOUNTER — Telehealth: Payer: Self-pay | Admitting: Internal Medicine

## 2019-03-19 DIAGNOSIS — G8929 Other chronic pain: Secondary | ICD-10-CM | POA: Diagnosis not present

## 2019-03-19 DIAGNOSIS — M818 Other osteoporosis without current pathological fracture: Secondary | ICD-10-CM | POA: Diagnosis not present

## 2019-03-19 DIAGNOSIS — N400 Enlarged prostate without lower urinary tract symptoms: Secondary | ICD-10-CM | POA: Diagnosis not present

## 2019-03-19 DIAGNOSIS — R110 Nausea: Secondary | ICD-10-CM | POA: Diagnosis not present

## 2019-03-19 DIAGNOSIS — I10 Essential (primary) hypertension: Secondary | ICD-10-CM | POA: Diagnosis not present

## 2019-03-19 DIAGNOSIS — J309 Allergic rhinitis, unspecified: Secondary | ICD-10-CM | POA: Diagnosis not present

## 2019-03-19 DIAGNOSIS — Z7982 Long term (current) use of aspirin: Secondary | ICD-10-CM | POA: Diagnosis not present

## 2019-03-19 DIAGNOSIS — E785 Hyperlipidemia, unspecified: Secondary | ICD-10-CM | POA: Diagnosis not present

## 2019-03-19 DIAGNOSIS — J449 Chronic obstructive pulmonary disease, unspecified: Secondary | ICD-10-CM | POA: Diagnosis not present

## 2019-03-19 DIAGNOSIS — I499 Cardiac arrhythmia, unspecified: Secondary | ICD-10-CM | POA: Diagnosis not present

## 2019-03-19 DIAGNOSIS — J9611 Chronic respiratory failure with hypoxia: Secondary | ICD-10-CM | POA: Diagnosis not present

## 2019-03-19 DIAGNOSIS — K5900 Constipation, unspecified: Secondary | ICD-10-CM | POA: Diagnosis not present

## 2019-03-19 NOTE — Telephone Encounter (Signed)
Called and spoke with Lattie Haw at [place] letting her know that the cxr can be performed there on pt if they would just fax [place] the results. Lattie Haw verbalized understanding. Nothing further needed.

## 2019-03-20 DIAGNOSIS — K5900 Constipation, unspecified: Secondary | ICD-10-CM | POA: Diagnosis not present

## 2019-03-20 DIAGNOSIS — M4846XD Fatigue fracture of vertebra, lumbar region, subsequent encounter for fracture with routine healing: Secondary | ICD-10-CM | POA: Diagnosis not present

## 2019-03-20 DIAGNOSIS — I2510 Atherosclerotic heart disease of native coronary artery without angina pectoris: Secondary | ICD-10-CM | POA: Diagnosis not present

## 2019-03-20 DIAGNOSIS — E440 Moderate protein-calorie malnutrition: Secondary | ICD-10-CM | POA: Diagnosis not present

## 2019-03-20 DIAGNOSIS — I5022 Chronic systolic (congestive) heart failure: Secondary | ICD-10-CM | POA: Diagnosis not present

## 2019-03-20 DIAGNOSIS — I10 Essential (primary) hypertension: Secondary | ICD-10-CM | POA: Diagnosis not present

## 2019-03-20 DIAGNOSIS — E785 Hyperlipidemia, unspecified: Secondary | ICD-10-CM | POA: Diagnosis not present

## 2019-03-20 DIAGNOSIS — M6281 Muscle weakness (generalized): Secondary | ICD-10-CM | POA: Diagnosis not present

## 2019-03-20 DIAGNOSIS — N400 Enlarged prostate without lower urinary tract symptoms: Secondary | ICD-10-CM | POA: Diagnosis not present

## 2019-03-20 DIAGNOSIS — J9611 Chronic respiratory failure with hypoxia: Secondary | ICD-10-CM | POA: Diagnosis not present

## 2019-03-21 ENCOUNTER — Ambulatory Visit (INDEPENDENT_AMBULATORY_CARE_PROVIDER_SITE_OTHER): Payer: Medicare Other | Admitting: Internal Medicine

## 2019-03-21 ENCOUNTER — Other Ambulatory Visit: Payer: Self-pay

## 2019-03-21 ENCOUNTER — Encounter: Payer: Self-pay | Admitting: Internal Medicine

## 2019-03-21 DIAGNOSIS — J449 Chronic obstructive pulmonary disease, unspecified: Secondary | ICD-10-CM | POA: Diagnosis not present

## 2019-03-21 DIAGNOSIS — J181 Lobar pneumonia, unspecified organism: Secondary | ICD-10-CM

## 2019-03-21 DIAGNOSIS — J9611 Chronic respiratory failure with hypoxia: Secondary | ICD-10-CM | POA: Diagnosis not present

## 2019-03-21 DIAGNOSIS — J189 Pneumonia, unspecified organism: Secondary | ICD-10-CM

## 2019-03-21 NOTE — Assessment & Plan Note (Signed)
Discovered during eval for fall at home. Asymptomatic. Most likely this was complication of his fall, but unsure. Now resolved on CXR after doxycycline Plan- obervation

## 2019-03-21 NOTE — Assessment & Plan Note (Signed)
He is managing  Still with O2 3-4L 24/7. Plan no change

## 2019-03-21 NOTE — Assessment & Plan Note (Signed)
Current symptoms at baseline after tough summer with inguinal hernia repair, fall with lumbar spine fx/ SNF and LLL pnemonia likely associated with the fall. Careful with covid precautions. Plan- continue present meds

## 2019-03-21 NOTE — Patient Instructions (Signed)
We can continue oxygen at 3-4 L, and current respiratory meds.  As discussed, it would be reasonable to update your Pneumovax-23 pneumonia vaccine sometime. We can do it at your next visit here if you don't get it sooner.  Please call if we can help

## 2019-03-21 NOTE — Progress Notes (Signed)
Patient ID: [name], male    DOB: 09-23-1934, 84 y.o.   MRN: [id number] PCP Dr Hulan Fess  HPI male former smoker followed for COPD/emphysema, pulmonary nodules, hypoxic respiratory failure, complicated by CAD P3XT 0/24/09-  MM PFT 04/27/11- Echocardiogram 07/31/2018-EF 55-60%, grade 1 DD, PHN 49 mmHg ------------------------------------------------------------------------------  10/12/2018- 84 year old male former smoker followed for COPD/emphysema, pulmonary nodules, hypoxic respiratory failure, complicated by CAD O2 3 - 4L/[facility] CXR 10/12/2018- Severe hyperinflation Labs from 05/23/18- BNP 109, Na 133. CO2 33, Hgb 13.2 Proair hfa, neb Duoneb, Spiriva, Symbicort 160,  Inguinal hernia developed after pneumonia with active cough and constipation from antibiotics.  Now little cough but still more DOE than before pneumonia. Nebs little help. No benefit from [place], [employer], Trelegy. Denies chest pain, palpitation. Ankles do swell.   03/21/2019 Virtual Visit via Telephone Note  I connected with [name] on 03/21/19 at 10:30 AM EDT by telephone and verified that I am speaking with the correct person using two identifiers.  Location: Patient: [name]  Provider: O   I discussed the limitations, risks, security and privacy concerns of performing an evaluation and management service by telephone and the availability of in person appointments. I also discussed with the patient that there may be a patient responsible charge related to this service. The patient expressed understanding and agreed to proceed.   History of Present Illness:  84 year old male former smoker followed for COPD/emphysema, pulmonary nodules, hypoxic respiratory failure, complicated by CAD O2 3 - 4L/[facility] Recent L inguinal hernia repair. Few weeks later fell at home hurting low back> lumbar compression fx treated with corset Spiriva, Symbicort, neb Duoneb, albuerol hfa CT showed LLL pneumonia Rx'd  with doxycycline. He never noted cough or breathing discomfort symptoms. Doxy finished 4-5 days ago. Now getting PT and OT before return home. Covid careful.  Observations/Objective: CTa chest 02/28/2019-  IMPRESSION: 1.  Negative examination for pulmonary embolism. 2. There is new heterogeneous airspace opacity of the lingula (series 10, image 102), likely infectious or inflammatory. Recommend follow-up CT in 3 months to exclude malignancy. 3. There is a new minimally displaced superior endplate fracture of the L1 vertebral body with adjacent retroperitoneal fat stranding and hematoma (series 12, image 103, series series 9, image 472). 4.  Emphysema. 5.  Coronary artery disease and aortic atherosclerosis. CXR 03/20/2019-   Assessment and Plan: COPD- no exacerbation Plan- continue present meds and O2 LLL CAP- responded to doxycycline and resolved on CXR report from [place]  Follow Up Instructions: 4 months   I discussed the assessment and treatment plan with the patient. The patient was provided an opportunity to ask questions and all were answered. The patient agreed with the plan and demonstrated an understanding of the instructions.   The patient was advised to call back or seek an in-person evaluation if the symptoms worsen or if the condition fails to improve as anticipated.  I provided *21 minutes of non-face-to-face time during this encounter.   Baird Lyons, MD   Review of Systems-see HPI   + = positive Constitutional:   No-   weight loss, night sweats fevers, no-chills, fatigue, lassitude. HEENT:   No-  headaches, difficulty swallowing, tooth/dental problems, sore throat,       No-  sneezing, itching, ear ache, nasal congestion, post nasal drip,  CV:  No-chest pain, no-orthopnea, PND, No-swelling in lower extremities, anasarca, dizziness, palpitations Resp: + shortness of breath with exertion, not at rest.  cough,  + non-productive  cough,  No-  coughing up of blood.              No change color of mucus.  No- wheezing.   Skin: No-   rash or lesions. GI:  No-   heartburn, indigestion, abdominal pain, nausea,   GU: No-   dysuria,  MS:  No-   joint pain or swelling.  Neuro-  Psych:  No- change in mood or affect. No acute depression or anxiety.  No memory loss.  OBJ- General- Alert, Oriented, Affect-appropriate, Distress- none acute. + O2 4L ,  Skin-  Lymphadenopathy- none Head- atraumatic            Eyes- Gross vision intact, PERRLA, conjunctivae clear secretions            Ears- Hearing, canals-normal            Nose- Clear, no-Septal dev, mucus, polyps, erosion, perforation             Throat- Mallampati II , mucosa clear , drainage- none, tonsils- atrophic.  Neck- flexible , trachea midline, no stridor , thyroid nl, carotid no bruit Chest - symmetrical excursion , unlabored           Heart/CV- RRR , no murmur , no gallop  , no rub, nl s1 s2                           - JVD- none , edema+2-3, stasis changes- none, varices- none           Lung-  Distant+, wheeze -none, unlabored, cough-none, dullness-none, rub- none           Chest wall-  Abd-  Br/ Gen/ Rectal- Not done, not indicated Extrem- cyanosis- none, clubbing, none, atrophy- none, strength- nl.  Neuro- grossly intact to observation

## 2019-03-22 DIAGNOSIS — H04129 Dry eye syndrome of unspecified lacrimal gland: Secondary | ICD-10-CM | POA: Diagnosis not present

## 2019-03-22 DIAGNOSIS — H04122 Dry eye syndrome of left lacrimal gland: Secondary | ICD-10-CM | POA: Diagnosis not present

## 2019-03-23 DIAGNOSIS — J9611 Chronic respiratory failure with hypoxia: Secondary | ICD-10-CM | POA: Diagnosis not present

## 2019-03-23 DIAGNOSIS — I5022 Chronic systolic (congestive) heart failure: Secondary | ICD-10-CM | POA: Diagnosis not present

## 2019-03-23 DIAGNOSIS — N400 Enlarged prostate without lower urinary tract symptoms: Secondary | ICD-10-CM | POA: Diagnosis not present

## 2019-03-23 DIAGNOSIS — I2510 Atherosclerotic heart disease of native coronary artery without angina pectoris: Secondary | ICD-10-CM | POA: Diagnosis not present

## 2019-03-23 DIAGNOSIS — M4846XD Fatigue fracture of vertebra, lumbar region, subsequent encounter for fracture with routine healing: Secondary | ICD-10-CM | POA: Diagnosis not present

## 2019-03-23 DIAGNOSIS — E440 Moderate protein-calorie malnutrition: Secondary | ICD-10-CM | POA: Diagnosis not present

## 2019-03-23 DIAGNOSIS — H04129 Dry eye syndrome of unspecified lacrimal gland: Secondary | ICD-10-CM | POA: Diagnosis not present

## 2019-03-23 DIAGNOSIS — I10 Essential (primary) hypertension: Secondary | ICD-10-CM | POA: Diagnosis not present

## 2019-03-23 DIAGNOSIS — E785 Hyperlipidemia, unspecified: Secondary | ICD-10-CM | POA: Diagnosis not present

## 2019-03-23 DIAGNOSIS — K5900 Constipation, unspecified: Secondary | ICD-10-CM | POA: Diagnosis not present

## 2019-03-23 DIAGNOSIS — H04122 Dry eye syndrome of left lacrimal gland: Secondary | ICD-10-CM | POA: Diagnosis not present

## 2019-03-23 DIAGNOSIS — M6281 Muscle weakness (generalized): Secondary | ICD-10-CM | POA: Diagnosis not present

## 2019-03-28 DIAGNOSIS — H04129 Dry eye syndrome of unspecified lacrimal gland: Secondary | ICD-10-CM | POA: Diagnosis not present

## 2019-03-28 DIAGNOSIS — J9611 Chronic respiratory failure with hypoxia: Secondary | ICD-10-CM | POA: Diagnosis not present

## 2019-03-28 DIAGNOSIS — N400 Enlarged prostate without lower urinary tract symptoms: Secondary | ICD-10-CM | POA: Diagnosis not present

## 2019-03-28 DIAGNOSIS — M818 Other osteoporosis without current pathological fracture: Secondary | ICD-10-CM | POA: Diagnosis not present

## 2019-03-28 DIAGNOSIS — K5900 Constipation, unspecified: Secondary | ICD-10-CM | POA: Diagnosis not present

## 2019-03-28 DIAGNOSIS — I10 Essential (primary) hypertension: Secondary | ICD-10-CM | POA: Diagnosis not present

## 2019-03-28 DIAGNOSIS — E785 Hyperlipidemia, unspecified: Secondary | ICD-10-CM | POA: Diagnosis not present

## 2019-03-28 DIAGNOSIS — I499 Cardiac arrhythmia, unspecified: Secondary | ICD-10-CM | POA: Diagnosis not present

## 2019-03-28 DIAGNOSIS — I5022 Chronic systolic (congestive) heart failure: Secondary | ICD-10-CM | POA: Diagnosis not present

## 2019-03-28 DIAGNOSIS — M6281 Muscle weakness (generalized): Secondary | ICD-10-CM | POA: Diagnosis not present

## 2019-03-28 DIAGNOSIS — E440 Moderate protein-calorie malnutrition: Secondary | ICD-10-CM | POA: Diagnosis not present

## 2019-03-28 DIAGNOSIS — J309 Allergic rhinitis, unspecified: Secondary | ICD-10-CM | POA: Diagnosis not present

## 2019-03-30 DIAGNOSIS — J189 Pneumonia, unspecified organism: Secondary | ICD-10-CM | POA: Diagnosis not present

## 2019-03-30 DIAGNOSIS — M6281 Muscle weakness (generalized): Secondary | ICD-10-CM | POA: Diagnosis not present

## 2019-03-30 DIAGNOSIS — S32010D Wedge compression fracture of first lumbar vertebra, subsequent encounter for fracture with routine healing: Secondary | ICD-10-CM | POA: Diagnosis not present

## 2019-03-30 DIAGNOSIS — Z7982 Long term (current) use of aspirin: Secondary | ICD-10-CM | POA: Diagnosis not present

## 2019-03-30 DIAGNOSIS — G8929 Other chronic pain: Secondary | ICD-10-CM | POA: Diagnosis not present

## 2019-03-30 DIAGNOSIS — J440 Chronic obstructive pulmonary disease with acute lower respiratory infection: Secondary | ICD-10-CM | POA: Diagnosis not present

## 2019-04-02 DIAGNOSIS — J189 Pneumonia, unspecified organism: Secondary | ICD-10-CM | POA: Diagnosis not present

## 2019-04-02 DIAGNOSIS — S32010D Wedge compression fracture of first lumbar vertebra, subsequent encounter for fracture with routine healing: Secondary | ICD-10-CM | POA: Diagnosis not present

## 2019-04-02 DIAGNOSIS — J440 Chronic obstructive pulmonary disease with acute lower respiratory infection: Secondary | ICD-10-CM | POA: Diagnosis not present

## 2019-04-02 DIAGNOSIS — G8929 Other chronic pain: Secondary | ICD-10-CM | POA: Diagnosis not present

## 2019-04-02 DIAGNOSIS — M6281 Muscle weakness (generalized): Secondary | ICD-10-CM | POA: Diagnosis not present

## 2019-04-02 DIAGNOSIS — Z7982 Long term (current) use of aspirin: Secondary | ICD-10-CM | POA: Diagnosis not present

## 2019-04-04 ENCOUNTER — Other Ambulatory Visit: Payer: Self-pay | Admitting: *Deleted

## 2019-04-04 DIAGNOSIS — G8929 Other chronic pain: Secondary | ICD-10-CM | POA: Diagnosis not present

## 2019-04-04 DIAGNOSIS — S32010D Wedge compression fracture of first lumbar vertebra, subsequent encounter for fracture with routine healing: Secondary | ICD-10-CM | POA: Diagnosis not present

## 2019-04-04 DIAGNOSIS — J440 Chronic obstructive pulmonary disease with acute lower respiratory infection: Secondary | ICD-10-CM | POA: Diagnosis not present

## 2019-04-04 DIAGNOSIS — M6281 Muscle weakness (generalized): Secondary | ICD-10-CM | POA: Diagnosis not present

## 2019-04-04 DIAGNOSIS — J189 Pneumonia, unspecified organism: Secondary | ICD-10-CM | POA: Diagnosis not present

## 2019-04-04 DIAGNOSIS — Z7982 Long term (current) use of aspirin: Secondary | ICD-10-CM | POA: Diagnosis not present

## 2019-04-04 NOTE — Patient Outreach (Signed)
Member assessed for potential [facility] Care Management needs as a benefit of  [place] [employer].  Verified in [employer] that Mr. Schaben discharged from [facility] on 03/28/19 per member request.   Telephone call made to [facility] home at [phone]. Wife answered the phone. Patient identifiers confirmed. Briefly discussed and offered [facility] Management follow up. Mrs. Lowdermilk declined stating " I think we have all that taken care of" and hung up the telephone.  Will sign off due to [place] Management services being declined.    Marthenia Rolling, MSN-Ed, RN,BSN [place] Acute Care Coordinator [phone] [facility]) [phone]  (Toll free office)

## 2019-04-05 DIAGNOSIS — J189 Pneumonia, unspecified organism: Secondary | ICD-10-CM | POA: Diagnosis not present

## 2019-04-05 DIAGNOSIS — J440 Chronic obstructive pulmonary disease with acute lower respiratory infection: Secondary | ICD-10-CM | POA: Diagnosis not present

## 2019-04-05 DIAGNOSIS — Z7982 Long term (current) use of aspirin: Secondary | ICD-10-CM | POA: Diagnosis not present

## 2019-04-05 DIAGNOSIS — M6281 Muscle weakness (generalized): Secondary | ICD-10-CM | POA: Diagnosis not present

## 2019-04-05 DIAGNOSIS — G8929 Other chronic pain: Secondary | ICD-10-CM | POA: Diagnosis not present

## 2019-04-05 DIAGNOSIS — S32010D Wedge compression fracture of first lumbar vertebra, subsequent encounter for fracture with routine healing: Secondary | ICD-10-CM | POA: Diagnosis not present

## 2019-04-06 DIAGNOSIS — J440 Chronic obstructive pulmonary disease with acute lower respiratory infection: Secondary | ICD-10-CM | POA: Diagnosis not present

## 2019-04-06 DIAGNOSIS — M6281 Muscle weakness (generalized): Secondary | ICD-10-CM | POA: Diagnosis not present

## 2019-04-06 DIAGNOSIS — J189 Pneumonia, unspecified organism: Secondary | ICD-10-CM | POA: Diagnosis not present

## 2019-04-06 DIAGNOSIS — G8929 Other chronic pain: Secondary | ICD-10-CM | POA: Diagnosis not present

## 2019-04-06 DIAGNOSIS — S32010D Wedge compression fracture of first lumbar vertebra, subsequent encounter for fracture with routine healing: Secondary | ICD-10-CM | POA: Diagnosis not present

## 2019-04-06 DIAGNOSIS — Z7982 Long term (current) use of aspirin: Secondary | ICD-10-CM | POA: Diagnosis not present

## 2019-04-09 DIAGNOSIS — M6281 Muscle weakness (generalized): Secondary | ICD-10-CM | POA: Diagnosis not present

## 2019-04-09 DIAGNOSIS — G8929 Other chronic pain: Secondary | ICD-10-CM | POA: Diagnosis not present

## 2019-04-09 DIAGNOSIS — J440 Chronic obstructive pulmonary disease with acute lower respiratory infection: Secondary | ICD-10-CM | POA: Diagnosis not present

## 2019-04-09 DIAGNOSIS — S32010D Wedge compression fracture of first lumbar vertebra, subsequent encounter for fracture with routine healing: Secondary | ICD-10-CM | POA: Diagnosis not present

## 2019-04-09 DIAGNOSIS — J189 Pneumonia, unspecified organism: Secondary | ICD-10-CM | POA: Diagnosis not present

## 2019-04-09 DIAGNOSIS — Z7982 Long term (current) use of aspirin: Secondary | ICD-10-CM | POA: Diagnosis not present

## 2019-04-10 DIAGNOSIS — M542 Cervicalgia: Secondary | ICD-10-CM | POA: Diagnosis not present

## 2019-04-10 DIAGNOSIS — S32010A Wedge compression fracture of first lumbar vertebra, initial encounter for closed fracture: Secondary | ICD-10-CM | POA: Diagnosis not present

## 2019-04-10 DIAGNOSIS — S32010D Wedge compression fracture of first lumbar vertebra, subsequent encounter for fracture with routine healing: Secondary | ICD-10-CM | POA: Diagnosis not present

## 2019-04-10 DIAGNOSIS — I10 Essential (primary) hypertension: Secondary | ICD-10-CM | POA: Diagnosis not present

## 2019-04-11 DIAGNOSIS — G8929 Other chronic pain: Secondary | ICD-10-CM | POA: Diagnosis not present

## 2019-04-11 DIAGNOSIS — J189 Pneumonia, unspecified organism: Secondary | ICD-10-CM | POA: Diagnosis not present

## 2019-04-11 DIAGNOSIS — S32010D Wedge compression fracture of first lumbar vertebra, subsequent encounter for fracture with routine healing: Secondary | ICD-10-CM | POA: Diagnosis not present

## 2019-04-11 DIAGNOSIS — M6281 Muscle weakness (generalized): Secondary | ICD-10-CM | POA: Diagnosis not present

## 2019-04-11 DIAGNOSIS — Z7982 Long term (current) use of aspirin: Secondary | ICD-10-CM | POA: Diagnosis not present

## 2019-04-11 DIAGNOSIS — J440 Chronic obstructive pulmonary disease with acute lower respiratory infection: Secondary | ICD-10-CM | POA: Diagnosis not present

## 2019-04-12 DIAGNOSIS — J440 Chronic obstructive pulmonary disease with acute lower respiratory infection: Secondary | ICD-10-CM | POA: Diagnosis not present

## 2019-04-12 DIAGNOSIS — Z7982 Long term (current) use of aspirin: Secondary | ICD-10-CM | POA: Diagnosis not present

## 2019-04-12 DIAGNOSIS — S32010D Wedge compression fracture of first lumbar vertebra, subsequent encounter for fracture with routine healing: Secondary | ICD-10-CM | POA: Diagnosis not present

## 2019-04-12 DIAGNOSIS — J189 Pneumonia, unspecified organism: Secondary | ICD-10-CM | POA: Diagnosis not present

## 2019-04-12 DIAGNOSIS — M6281 Muscle weakness (generalized): Secondary | ICD-10-CM | POA: Diagnosis not present

## 2019-04-12 DIAGNOSIS — G8929 Other chronic pain: Secondary | ICD-10-CM | POA: Diagnosis not present

## 2019-04-13 DIAGNOSIS — J189 Pneumonia, unspecified organism: Secondary | ICD-10-CM | POA: Diagnosis not present

## 2019-04-13 DIAGNOSIS — S32010D Wedge compression fracture of first lumbar vertebra, subsequent encounter for fracture with routine healing: Secondary | ICD-10-CM | POA: Diagnosis not present

## 2019-04-13 DIAGNOSIS — J440 Chronic obstructive pulmonary disease with acute lower respiratory infection: Secondary | ICD-10-CM | POA: Diagnosis not present

## 2019-04-13 DIAGNOSIS — M6281 Muscle weakness (generalized): Secondary | ICD-10-CM | POA: Diagnosis not present

## 2019-04-13 DIAGNOSIS — Z7982 Long term (current) use of aspirin: Secondary | ICD-10-CM | POA: Diagnosis not present

## 2019-04-13 DIAGNOSIS — G8929 Other chronic pain: Secondary | ICD-10-CM | POA: Diagnosis not present

## 2019-04-16 DIAGNOSIS — M6281 Muscle weakness (generalized): Secondary | ICD-10-CM | POA: Diagnosis not present

## 2019-04-16 DIAGNOSIS — S32010D Wedge compression fracture of first lumbar vertebra, subsequent encounter for fracture with routine healing: Secondary | ICD-10-CM | POA: Diagnosis not present

## 2019-04-16 DIAGNOSIS — Z7982 Long term (current) use of aspirin: Secondary | ICD-10-CM | POA: Diagnosis not present

## 2019-04-16 DIAGNOSIS — J440 Chronic obstructive pulmonary disease with acute lower respiratory infection: Secondary | ICD-10-CM | POA: Diagnosis not present

## 2019-04-16 DIAGNOSIS — J189 Pneumonia, unspecified organism: Secondary | ICD-10-CM | POA: Diagnosis not present

## 2019-04-16 DIAGNOSIS — G8929 Other chronic pain: Secondary | ICD-10-CM | POA: Diagnosis not present

## 2019-04-17 DIAGNOSIS — S32010D Wedge compression fracture of first lumbar vertebra, subsequent encounter for fracture with routine healing: Secondary | ICD-10-CM | POA: Diagnosis not present

## 2019-04-17 DIAGNOSIS — G8929 Other chronic pain: Secondary | ICD-10-CM | POA: Diagnosis not present

## 2019-04-17 DIAGNOSIS — Z7982 Long term (current) use of aspirin: Secondary | ICD-10-CM | POA: Diagnosis not present

## 2019-04-17 DIAGNOSIS — M6281 Muscle weakness (generalized): Secondary | ICD-10-CM | POA: Diagnosis not present

## 2019-04-17 DIAGNOSIS — J189 Pneumonia, unspecified organism: Secondary | ICD-10-CM | POA: Diagnosis not present

## 2019-04-17 DIAGNOSIS — J440 Chronic obstructive pulmonary disease with acute lower respiratory infection: Secondary | ICD-10-CM | POA: Diagnosis not present

## 2019-04-19 DIAGNOSIS — M6281 Muscle weakness (generalized): Secondary | ICD-10-CM | POA: Diagnosis not present

## 2019-04-19 DIAGNOSIS — J440 Chronic obstructive pulmonary disease with acute lower respiratory infection: Secondary | ICD-10-CM | POA: Diagnosis not present

## 2019-04-19 DIAGNOSIS — J189 Pneumonia, unspecified organism: Secondary | ICD-10-CM | POA: Diagnosis not present

## 2019-04-19 DIAGNOSIS — Z7982 Long term (current) use of aspirin: Secondary | ICD-10-CM | POA: Diagnosis not present

## 2019-04-19 DIAGNOSIS — S32010D Wedge compression fracture of first lumbar vertebra, subsequent encounter for fracture with routine healing: Secondary | ICD-10-CM | POA: Diagnosis not present

## 2019-04-19 DIAGNOSIS — G8929 Other chronic pain: Secondary | ICD-10-CM | POA: Diagnosis not present

## 2019-04-20 DIAGNOSIS — J189 Pneumonia, unspecified organism: Secondary | ICD-10-CM | POA: Diagnosis not present

## 2019-04-20 DIAGNOSIS — M6281 Muscle weakness (generalized): Secondary | ICD-10-CM | POA: Diagnosis not present

## 2019-04-20 DIAGNOSIS — S32010D Wedge compression fracture of first lumbar vertebra, subsequent encounter for fracture with routine healing: Secondary | ICD-10-CM | POA: Diagnosis not present

## 2019-04-20 DIAGNOSIS — Z7982 Long term (current) use of aspirin: Secondary | ICD-10-CM | POA: Diagnosis not present

## 2019-04-20 DIAGNOSIS — G8929 Other chronic pain: Secondary | ICD-10-CM | POA: Diagnosis not present

## 2019-04-20 DIAGNOSIS — J440 Chronic obstructive pulmonary disease with acute lower respiratory infection: Secondary | ICD-10-CM | POA: Diagnosis not present

## 2019-04-24 ENCOUNTER — Other Ambulatory Visit: Payer: Self-pay | Admitting: Cardiovascular Disease

## 2019-04-24 DIAGNOSIS — J189 Pneumonia, unspecified organism: Secondary | ICD-10-CM | POA: Diagnosis not present

## 2019-04-24 DIAGNOSIS — M6281 Muscle weakness (generalized): Secondary | ICD-10-CM | POA: Diagnosis not present

## 2019-04-24 DIAGNOSIS — S32010D Wedge compression fracture of first lumbar vertebra, subsequent encounter for fracture with routine healing: Secondary | ICD-10-CM | POA: Diagnosis not present

## 2019-04-24 DIAGNOSIS — G8929 Other chronic pain: Secondary | ICD-10-CM | POA: Diagnosis not present

## 2019-04-24 DIAGNOSIS — Z7982 Long term (current) use of aspirin: Secondary | ICD-10-CM | POA: Diagnosis not present

## 2019-04-24 DIAGNOSIS — J440 Chronic obstructive pulmonary disease with acute lower respiratory infection: Secondary | ICD-10-CM | POA: Diagnosis not present

## 2019-04-26 DIAGNOSIS — M6281 Muscle weakness (generalized): Secondary | ICD-10-CM | POA: Diagnosis not present

## 2019-04-26 DIAGNOSIS — J440 Chronic obstructive pulmonary disease with acute lower respiratory infection: Secondary | ICD-10-CM | POA: Diagnosis not present

## 2019-04-26 DIAGNOSIS — S32010D Wedge compression fracture of first lumbar vertebra, subsequent encounter for fracture with routine healing: Secondary | ICD-10-CM | POA: Diagnosis not present

## 2019-04-26 DIAGNOSIS — Z7982 Long term (current) use of aspirin: Secondary | ICD-10-CM | POA: Diagnosis not present

## 2019-04-26 DIAGNOSIS — G8929 Other chronic pain: Secondary | ICD-10-CM | POA: Diagnosis not present

## 2019-04-26 DIAGNOSIS — J189 Pneumonia, unspecified organism: Secondary | ICD-10-CM | POA: Diagnosis not present

## 2019-04-29 DIAGNOSIS — G8929 Other chronic pain: Secondary | ICD-10-CM | POA: Diagnosis not present

## 2019-04-29 DIAGNOSIS — J189 Pneumonia, unspecified organism: Secondary | ICD-10-CM | POA: Diagnosis not present

## 2019-04-29 DIAGNOSIS — S32010D Wedge compression fracture of first lumbar vertebra, subsequent encounter for fracture with routine healing: Secondary | ICD-10-CM | POA: Diagnosis not present

## 2019-04-29 DIAGNOSIS — M6281 Muscle weakness (generalized): Secondary | ICD-10-CM | POA: Diagnosis not present

## 2019-04-29 DIAGNOSIS — Z7982 Long term (current) use of aspirin: Secondary | ICD-10-CM | POA: Diagnosis not present

## 2019-04-29 DIAGNOSIS — J440 Chronic obstructive pulmonary disease with acute lower respiratory infection: Secondary | ICD-10-CM | POA: Diagnosis not present

## 2019-05-02 DIAGNOSIS — G8929 Other chronic pain: Secondary | ICD-10-CM | POA: Diagnosis not present

## 2019-05-02 DIAGNOSIS — Z7982 Long term (current) use of aspirin: Secondary | ICD-10-CM | POA: Diagnosis not present

## 2019-05-02 DIAGNOSIS — J440 Chronic obstructive pulmonary disease with acute lower respiratory infection: Secondary | ICD-10-CM | POA: Diagnosis not present

## 2019-05-02 DIAGNOSIS — J189 Pneumonia, unspecified organism: Secondary | ICD-10-CM | POA: Diagnosis not present

## 2019-05-02 DIAGNOSIS — S32010D Wedge compression fracture of first lumbar vertebra, subsequent encounter for fracture with routine healing: Secondary | ICD-10-CM | POA: Diagnosis not present

## 2019-05-02 DIAGNOSIS — M6281 Muscle weakness (generalized): Secondary | ICD-10-CM | POA: Diagnosis not present

## 2019-05-09 DIAGNOSIS — S32010D Wedge compression fracture of first lumbar vertebra, subsequent encounter for fracture with routine healing: Secondary | ICD-10-CM | POA: Diagnosis not present

## 2019-05-09 DIAGNOSIS — M6281 Muscle weakness (generalized): Secondary | ICD-10-CM | POA: Diagnosis not present

## 2019-05-09 DIAGNOSIS — J440 Chronic obstructive pulmonary disease with acute lower respiratory infection: Secondary | ICD-10-CM | POA: Diagnosis not present

## 2019-05-09 DIAGNOSIS — J189 Pneumonia, unspecified organism: Secondary | ICD-10-CM | POA: Diagnosis not present

## 2019-05-09 DIAGNOSIS — G8929 Other chronic pain: Secondary | ICD-10-CM | POA: Diagnosis not present

## 2019-05-09 DIAGNOSIS — Z7982 Long term (current) use of aspirin: Secondary | ICD-10-CM | POA: Diagnosis not present

## 2019-05-13 ENCOUNTER — Other Ambulatory Visit: Payer: Self-pay | Admitting: Internal Medicine

## 2019-05-16 DIAGNOSIS — S32010D Wedge compression fracture of first lumbar vertebra, subsequent encounter for fracture with routine healing: Secondary | ICD-10-CM | POA: Diagnosis not present

## 2019-05-16 DIAGNOSIS — J440 Chronic obstructive pulmonary disease with acute lower respiratory infection: Secondary | ICD-10-CM | POA: Diagnosis not present

## 2019-05-16 DIAGNOSIS — J189 Pneumonia, unspecified organism: Secondary | ICD-10-CM | POA: Diagnosis not present

## 2019-05-16 DIAGNOSIS — M6281 Muscle weakness (generalized): Secondary | ICD-10-CM | POA: Diagnosis not present

## 2019-05-16 DIAGNOSIS — Z7982 Long term (current) use of aspirin: Secondary | ICD-10-CM | POA: Diagnosis not present

## 2019-05-16 DIAGNOSIS — G8929 Other chronic pain: Secondary | ICD-10-CM | POA: Diagnosis not present

## 2019-05-22 DIAGNOSIS — S32010D Wedge compression fracture of first lumbar vertebra, subsequent encounter for fracture with routine healing: Secondary | ICD-10-CM | POA: Diagnosis not present

## 2019-05-22 DIAGNOSIS — M542 Cervicalgia: Secondary | ICD-10-CM | POA: Diagnosis not present

## 2019-05-23 DIAGNOSIS — R5383 Other fatigue: Secondary | ICD-10-CM | POA: Diagnosis not present

## 2019-05-23 DIAGNOSIS — J440 Chronic obstructive pulmonary disease with acute lower respiratory infection: Secondary | ICD-10-CM | POA: Diagnosis not present

## 2019-05-23 DIAGNOSIS — Z7982 Long term (current) use of aspirin: Secondary | ICD-10-CM | POA: Diagnosis not present

## 2019-05-23 DIAGNOSIS — D649 Anemia, unspecified: Secondary | ICD-10-CM | POA: Diagnosis not present

## 2019-05-23 DIAGNOSIS — J189 Pneumonia, unspecified organism: Secondary | ICD-10-CM | POA: Diagnosis not present

## 2019-05-23 DIAGNOSIS — G8929 Other chronic pain: Secondary | ICD-10-CM | POA: Diagnosis not present

## 2019-05-23 DIAGNOSIS — Z23 Encounter for immunization: Secondary | ICD-10-CM | POA: Diagnosis not present

## 2019-05-23 DIAGNOSIS — M6281 Muscle weakness (generalized): Secondary | ICD-10-CM | POA: Diagnosis not present

## 2019-05-23 DIAGNOSIS — J449 Chronic obstructive pulmonary disease, unspecified: Secondary | ICD-10-CM | POA: Diagnosis not present

## 2019-05-23 DIAGNOSIS — S32010D Wedge compression fracture of first lumbar vertebra, subsequent encounter for fracture with routine healing: Secondary | ICD-10-CM | POA: Diagnosis not present

## 2019-05-24 LAB — VITAMIN B12: Vitamin B-12: 359

## 2019-05-28 ENCOUNTER — Other Ambulatory Visit: Payer: Self-pay | Admitting: General Surgery

## 2019-05-28 DIAGNOSIS — J449 Chronic obstructive pulmonary disease, unspecified: Secondary | ICD-10-CM

## 2019-05-28 DIAGNOSIS — J9611 Chronic respiratory failure with hypoxia: Secondary | ICD-10-CM

## 2019-05-28 NOTE — Telephone Encounter (Signed)
If Pulmonary Rehabilitation is taking patients now, let's refer him for dx severe COPD mixed type

## 2019-05-28 NOTE — Telephone Encounter (Signed)
CY - please advise. Thanks! 

## 2019-06-05 ENCOUNTER — Encounter: Payer: Self-pay | Admitting: Physical Therapy

## 2019-06-05 ENCOUNTER — Other Ambulatory Visit: Payer: Self-pay

## 2019-06-05 ENCOUNTER — Ambulatory Visit: Payer: Medicare Other | Attending: Neurosurgery | Admitting: Physical Therapy

## 2019-06-05 DIAGNOSIS — M545 Low back pain, unspecified: Secondary | ICD-10-CM

## 2019-06-05 DIAGNOSIS — M6281 Muscle weakness (generalized): Secondary | ICD-10-CM | POA: Diagnosis not present

## 2019-06-05 DIAGNOSIS — R262 Difficulty in walking, not elsewhere classified: Secondary | ICD-10-CM | POA: Insufficient documentation

## 2019-06-05 DIAGNOSIS — R2681 Unsteadiness on feet: Secondary | ICD-10-CM | POA: Diagnosis not present

## 2019-06-05 NOTE — Therapy (Signed)
[facility] [address] [place], [place], [address] Phone: [phone]   Fax:  [phone]  Physical Therapy Evaluation  Patient Details  Name: [name] MRN: [id number] Date of Birth: 11/27/1934 Referring Provider (PT): Dr. Newman Pies   Encounter Date: 06/05/2019  PT End of Session - 06/05/19 1959    Visit Number  1    Date for PT Re-Evaluation  07/31/19    Authorization Type  Medicare 10th visit prog note:  KX at visit 15    PT Start Time  1015    PT Stop Time  1100    PT Time Calculation (min)  45 min    Activity Tolerance  Patient limited by fatigue       Past Medical History:  Diagnosis Date  . Allergic rhinitis, cause unspecified   . Arthritis   . COPD (chronic obstructive pulmonary disease) ([place])   . Dyspnea   . Dysrhythmia    "1 episode of tachycardia in 1980's, been on it ever since"  . Emphysema of lung ([place])   . Hypertension   . Left inguinal hernia 02/07/2019  . Neuromuscular disorder ([place])   . Neuropathy    feet  . Pneumonia    History of, last Oct 2019  . Pulmonary nodule     Past Surgical History:  Procedure Laterality Date  . APPENDECTOMY    . CARDIAC CATHETERIZATION  2003   performed at [facility], Dr. Fransico Him  . CATARACT EXTRACTION, BILATERAL    . EYE SURGERY    . INGUINAL HERNIA REPAIR Left 02/07/2019   Procedure: OPEN REPAIR LEFT INGUINAL HERNIA WITH MESH;  Surgeon: Fanny Skates, MD;  Location: [facility];  Service: General;  Laterality: Left;  SPINAL AND TAP BLOCK ANESTHESIA  . ROTATOR CUFF REPAIR    . TONSILLECTOMY    . VASECTOMY      There were no vitals filed for this visit.   Subjective Assessment - 06/05/19 1020    Subjective  Had hernia surgery in June then fell in mid July.  Wedge compression fracture of L1.  Went to ED.  Discharged home but couldn't get out of bed.  Sons helped for 2 weeks.  Went to [facility] for 2 weeks but that was a Music therapist center  was closed for covid.    RW since the fall.  Not a candidate for surgery.  Had HHPT doing sitting ex's and walking around the house, home PT said right LE weaker and turns out.    Able to drive.    Pertinent History  wife can't help me, she has her own problems;  O2 full time;  RW full time;  has a home stair lift;  had pulmonary rehab 1 year ago;  he would like to get stronger to do pulmonary rehab after PT;  wearing brace but pt states the doctor said it was up to me whether to wear it or not    How long can you stand comfortably?  5/10 pain with standing 1/2 hour    How long can you walk comfortably?  out of breath < 50 yards    Diagnostic tests  x-ray at last visit "hasn't changed much"    Patient Stated Goals  I want to be done with the walker.  be comfortable walking and deceased fear of falling    Currently in Pain?  Yes    Pain Score  1     Pain Location  Back    Pain Type  Chronic pain    Aggravating Factors   standing; bending, twisting;  a little bit with getting out of bed    Pain Relieving Factors  sitting; lying         [facility] PT Assessment - 06/05/19 0001      Assessment   Medical Diagnosis  wedge compression fracture     Referring Provider (PT)  Dr. Newman Pies    Onset Date/Surgical Date  --   mid July    Next MD Visit  as needed     Prior Therapy  to [facility]       Precautions   Precautions  Fall;Other (comment)      Restrictions   Weight Bearing Restrictions  No      Balance Screen   Has the patient fallen in the past 6 months  Yes    How many times?  1    Has the patient had a decrease in activity level because of a fear of falling?   Yes    Is the patient reluctant to leave their home because of a fear of falling?   No      Home Social worker  Private residence    Living Arrangements  Spouse/significant other    Type of [place] to enter    Entrance Stairs-Number of Steps  [place]  Two level     [place] - 2 wheels;[place] - single point;Shower seat    Additional Comments  stair lift for last 3-4 years   able to stand in shower     Prior Function   Leisure  go out to dinner;  boat modeling, go to [employer]; play on computer       Observation/Other Assessments   Activities of Balance Confidence Scale (ABC Scale)   to be done next visit       AROM   Overall AROM Comments  bil UEs and LEs grossly WFLs      Strength   Overall Strength Comments  able to rise from chair with min UE use pushing on thighs:  LEs grossly 4/5;  trunk strength 4-/5       Transfers   Comments  pt needs assistance carrying O2 tank to his car, assist with loading RW into his car and helping him reach car door to close       Ambulation/Gait   Assistive device  Rolling walker    Gait Comments  pt is unable to carry his O2 tank with ambulation therapist carries      Standardized Balance Assessment   Five times sit to stand comments   34 sec hands on knees       Berg Balance Test   Sit to Stand  Able to stand  independently using hands    Standing Unsupported  Able to stand safely 2 minutes    Sitting with Back Unsupported but Feet Supported on Floor or Stool  Able to sit safely and securely 2 minutes    Stand to Sit  Sits safely with minimal use of hands    Transfers  Able to transfer safely, definite need of hands    Standing Unsupported with Eyes Closed  Able to stand 10 seconds with supervision    Standing Unsupported with Feet Together  Able to place feet together independently but unable to hold for 30  seconds    From Standing, Reach Forward with Outstretched Arm  Can reach forward >5 cm safely (2")    From Standing Position, Pick up Object from [place] to pick up shoe, needs supervision    From Standing Position, Turn to Look Behind Over each Shoulder  Looks behind from both sides and weight shifts well    Turn 360 Degrees  Able to turn 360 degrees safely one side only in 4 seconds or  less    Standing Unsupported, Alternately Place Feet on Step/Stool  Able to stand independently and complete 8 steps >20 seconds    Standing Unsupported, One Foot in [place] to take small step independently and hold 30 seconds    Standing on One Leg  Able to lift leg independently and hold equal to or more than 3 seconds    Total Score  42      Timed Up and Go Test   Normal TUG (seconds)  19.07    TUG Comments  RW                Objective measurements completed on examination: See above findings.                PT Short Term Goals - 06/05/19 2015      PT SHORT TERM GOAL #1   Title  independent with initial HEP    Time  4    Period  Weeks    Status  New    Target Date  07/03/19      PT SHORT TERM GOAL #2   Title  The patient will be able to ambulate 240 feet with pain level 4/10    Time  4    Period  Weeks    Status  New      PT SHORT TERM GOAL #3   Title  Timed up and Go speed  improved to 17.5 sec or less    Time  4    Period  Weeks    Status  New      PT SHORT TERM GOAL #4   Title  BERG test improved from 42/56 to 45/56    Time  4    Period  Weeks    Status  New        PT Long Term Goals - 06/05/19 2018      PT LONG TERM GOAL #1   Title  be independent in advanced HEP    Time  8    Period  Weeks    Status  New    Target Date  07/31/19      PT LONG TERM GOAL #2   Title  BERG balance test improved to 47/56 indicating improved gait safety and decreased risk of falls    Time  8    Period  Weeks    Status  New      PT LONG TERM GOAL #3   Title  The patient will be able to walk for 5 minutes with pain level 4/10 or less    Time  8    Period  Weeks    Status  New      PT LONG TERM GOAL #4   Title  Timed up and go speed improved to 16.5 sec or less    Time  8    Period  Weeks    Status  New      PT LONG TERM GOAL #5  Title  Activities Based Specific Scale (ABC) will indicate decreased fearfulness of falling from initial  assessment    Time  8    Period  Weeks    Status  New             Plan - 06/05/19 2000    Clinical Impression Statement  The patient is an 84 year old who presents to PT with a RW and portable O2.  He is unable to carry his O2 tank while ambulating and requests assistance within the clinic and to return to his vehicle.  He has a long history of chronic LBP but in July, after having hernia surgery in June, he fell sustaining a L1 wedge compression fracture.  He was discharged home from the ED but was unable to even get out of bed and needed family assist.  He then went for a 2 week stay at [facility] before returning home.  He can maneuver out of bed with greater ease now but has increased pain with standing, bending, and twisting.  He also complains of shortness of breath, general weakness and fearfulness of falling.  He needs minmal assist of hands on thighs to rise sit to stand.  LE strength grossly 4/5.  BERG balance test indicates a near 100% chance of future falls with a score of 42/56.  Timed up and Go score is quite limited at 19.07.  His ambulation distance is limited to < 50 yards and he requires full time use of RW and assistance to manage his O2 tank while ambulating and getting in/out of his vehicle.  He would benefit from PT to address these deficits.    Personal Factors and Comorbidities  Age;Past/Current Experience;Comorbidity 1;Comorbidity 2;Comorbidity 3+;Fitness;Time since onset of injury/illness/exacerbation    Comorbidities  COPD, O2 dependent, CAD, HTN, chronic LBP    Examination-Activity Limitations  Bend;Carry;Stairs;Lift;Stand;Locomotion Level;Hygiene/Grooming;Dressing    Examination-Participation Restrictions  Community Activity;Driving;Other    Stability/Clinical Decision Making  Evolving/Moderate complexity    Clinical Decision Making  Moderate    Rehab Potential  Good    PT Frequency  2x / week    PT Duration  8 weeks    PT Treatment/Interventions  ADLs/Self  Care Home Management;Gait training;Functional mobility training;Therapeutic activities;Therapeutic exercise;Balance training;Neuromuscular re-education;Manual techniques;Patient/family education;Taping;Dry needling    PT Next Visit Plan  Nu-Step; try 3 minute walk test;  do ABC scale;  initiate a HEP for general strengthening and balance    Consulted and Agree with Plan of Care  Patient       Patient will benefit from skilled therapeutic intervention in order to improve the following deficits and impairments:  Difficulty walking, Cardiopulmonary status limiting activity, Decreased endurance, Decreased activity tolerance, Impaired perceived functional ability, Pain, Decreased balance, Decreased strength  Visit Diagnosis: Acute midline low back pain without sciatica - Plan: PT plan of care cert/re-cert  Muscle weakness (generalized) - Plan: PT plan of care cert/re-cert  Difficulty in walking, not elsewhere classified - Plan: PT plan of care cert/re-cert  Unsteadiness on feet - Plan: PT plan of care cert/re-cert     Problem List Patient Active Problem List   Diagnosis Date Noted  . Left inguinal hernia 02/07/2019  . Left leg pain 05/18/2018  . Pneumonia 05/18/2018  . Chronic respiratory failure with hypoxia ([place]) 01/12/2015  . COPD with acute exacerbation ([place]) 01/21/2013  . Hyperlipidemia 12/01/2011  . Hypertension 12/01/2011  . CAD (coronary artery disease) 05/23/2011  . Lung nodule 09/16/2008  . ALLERGIC RHINITIS 07/24/2007  .  COPD mixed type ([place]) 07/24/2007  . BURSITIS 07/24/2007   Ruben Im, PT 06/05/19 8:27 PM Phone: [phone] Fax: [phone] Alvera Singh 06/05/2019, 8:27 PM  [facility] [address], [place], [place], [address] Phone: [phone]   Fax:  [phone]  Name: [name] MRN: [id number] Date of Birth: 1935-03-23

## 2019-06-06 DIAGNOSIS — T148XXA Other injury of unspecified body region, initial encounter: Secondary | ICD-10-CM | POA: Diagnosis not present

## 2019-06-06 DIAGNOSIS — D649 Anemia, unspecified: Secondary | ICD-10-CM | POA: Diagnosis not present

## 2019-06-07 ENCOUNTER — Other Ambulatory Visit: Payer: Self-pay

## 2019-06-07 ENCOUNTER — Ambulatory Visit: Payer: Medicare Other | Admitting: Physical Therapy

## 2019-06-07 DIAGNOSIS — M545 Low back pain, unspecified: Secondary | ICD-10-CM

## 2019-06-07 DIAGNOSIS — R2681 Unsteadiness on feet: Secondary | ICD-10-CM

## 2019-06-07 DIAGNOSIS — R262 Difficulty in walking, not elsewhere classified: Secondary | ICD-10-CM

## 2019-06-07 DIAGNOSIS — M6281 Muscle weakness (generalized): Secondary | ICD-10-CM

## 2019-06-07 NOTE — Therapy (Signed)
[facility] [address] [place], [place], [address] Phone: [phone]   Fax:  [phone]  Physical Therapy Treatment  Patient Details  Name: [name] MRN: [id number] Date of Birth: 03/22/1935 Referring Provider (PT): Dr. Newman Pies   Encounter Date: 06/07/2019  PT End of Session - 06/07/19 1354    Visit Number  2    Date for PT Re-Evaluation  07/31/19    Authorization Type  Medicare 10th visit prog note:  KX at visit 15    PT Start Time  1230    PT Stop Time  1315    PT Time Calculation (min)  45 min    Activity Tolerance  Patient limited by fatigue;Other (comment)       Past Medical History:  Diagnosis Date  . Allergic rhinitis, cause unspecified   . Arthritis   . COPD (chronic obstructive pulmonary disease) ([place])   . Dyspnea   . Dysrhythmia    "1 episode of tachycardia in 1980's, been on it ever since"  . Emphysema of lung ([place])   . Hypertension   . Left inguinal hernia 02/07/2019  . Neuromuscular disorder ([place])   . Neuropathy    feet  . Pneumonia    History of, last Oct 2019  . Pulmonary nodule     Past Surgical History:  Procedure Laterality Date  . APPENDECTOMY    . CARDIAC CATHETERIZATION  2003   performed at [facility], Dr. Fransico Him  . CATARACT EXTRACTION, BILATERAL    . EYE SURGERY    . INGUINAL HERNIA REPAIR Left 02/07/2019   Procedure: OPEN REPAIR LEFT INGUINAL HERNIA WITH MESH;  Surgeon: Fanny Skates, MD;  Location: [facility];  Service: General;  Laterality: Left;  SPINAL AND TAP BLOCK ANESTHESIA  . ROTATOR CUFF REPAIR    . TONSILLECTOMY    . VASECTOMY      There were no vitals filed for this visit.  Subjective Assessment - 06/07/19 1234    Subjective  I have a longer O2 cannula today.  On my other RW I have a holder for the oxygen tank and a basket but it's too heavy to put in the car to bring here.    Pertinent History  wife can't help me, she has her own problems;  O2  full time;  RW full time;  has a home stair lift;  had pulmonary rehab 1 year ago;  he would like to get stronger to do pulmonary rehab after PT;  wearing brace but pt states the doctor said it was up to me whether to wear it or not    Currently in Pain?  Yes    Pain Score  1     Pain Location  Back         [facility] PT Assessment - 06/07/19 0001      Observation/Other Assessments   Activities of Balance Confidence Scale (ABC Scale)   20.3      6 Minute Walk- Baseline   HR (bpm)  100    02 Sat (%RA)  89 %      6 minute walk test results    Aerobic Endurance Distance Walked  200    Endurance additional comments  RW and O2  2:30                    [facility] Adult PT Treatment/Exercise - 06/07/19 0001      Lumbar Exercises: Aerobic   Nustep  2 rounds of 90 sec    O2 sat 88% following HR 133 on 4L of O2     Lumbar Exercises: Standing   Heel Raises  10 reps      Lumbar Exercises: Seated   Other Seated Lumbar Exercises  red plyo ball Vs 30 sec;  shoulder to hip 30 sec each side     Other Seated Lumbar Exercises  foam roll push down 10x       Knee/Hip Exercises: Standing   Hip Abduction  Right;Left;2 sets;5 reps    Hip Extension  AROM;Right;Left;2 sets;5 reps    Other Standing Knee Exercises  step taps 30 sec 2 rounds    Other Standing Knee Exercises  wall push ups 30 sec x2              PT Education - 06/07/19 1353    Education Details  Access Code: [email or web address] seated core ex; standing Heel raises    Person(s) Educated  Patient    Methods  Explanation;Demonstration;Handout    Comprehension  Returned demonstration;Verbalized understanding       PT Short Term Goals - 06/05/19 2015      PT SHORT TERM GOAL #1   Title  independent with initial HEP    Time  4    Period  Weeks    Status  New    Target Date  07/03/19      PT SHORT TERM GOAL #2   Title  The patient will be able to ambulate 240 feet with pain level 4/10    Time  4    Period  Weeks    Status  New       PT SHORT TERM GOAL #3   Title  Timed up and Go speed  improved to 17.5 sec or less    Time  4    Period  Weeks    Status  New      PT SHORT TERM GOAL #4   Title  BERG test improved from 42/56 to 45/56    Time  4    Period  Weeks    Status  New        PT Long Term Goals - 06/05/19 2018      PT LONG TERM GOAL #1   Title  be independent in advanced HEP    Time  8    Period  Weeks    Status  New    Target Date  07/31/19      PT LONG TERM GOAL #2   Title  BERG balance test improved to 47/56 indicating improved gait safety and decreased risk of falls    Time  8    Period  Weeks    Status  New      PT LONG TERM GOAL #3   Title  The patient will be able to walk for 5 minutes with pain level 4/10 or less    Time  8    Period  Weeks    Status  New      PT LONG TERM GOAL #4   Title  Timed up and go speed improved to 16.5 sec or less    Time  8    Period  Weeks    Status  New      PT LONG TERM GOAL #5   Title  Activities Based Specific Scale (ABC) will indicate decreased fearfulness of falling from initial assessment    Time  8  Period  Weeks    Status  New            Plan - 06/07/19 1319    Clinical Impression Statement  The patient is tearful during treatment session regarding how much he feels like he "has declined".  He needs multiple rest breaks during treatment session secondary to shortness of breath.  With the Nu-Step and with ambulating 2 minutes his O2 saturation drops below 90%.  His Activities Specific Balance Confidence ABC scale indicates very low confidence in his balance with functional activities.  The patients needs close monitoring of oxygen saturation levels with activity for appropriate modifications.    Comorbidities  COPD, O2 dependent, CAD, HTN, chronic LBP    Rehab Potential  Good    PT Frequency  2x / week    PT Duration  8 weeks    PT Treatment/Interventions  ADLs/Self Care Home Management;Gait training;Functional mobility  training;Therapeutic activities;Therapeutic exercise;Balance training;Neuromuscular re-education;Manual techniques;Patient/family education;Taping;Dry needling    PT Next Visit Plan  Nu-Step;  short bouts of seated and standing ex with monitoring of O2 sats;  walking laps;  add to HEP    PT Home Exercise Plan  Access Code: [email or web address]       Patient will benefit from skilled therapeutic intervention in order to improve the following deficits and impairments:  Difficulty walking, Cardiopulmonary status limiting activity, Decreased endurance, Decreased activity tolerance, Impaired perceived functional ability, Pain, Decreased balance, Decreased strength  Visit Diagnosis: Acute midline low back pain without sciatica  Muscle weakness (generalized)  Difficulty in walking, not elsewhere classified  Unsteadiness on feet     Problem List Patient Active Problem List   Diagnosis Date Noted  . Left inguinal hernia 02/07/2019  . Left leg pain 05/18/2018  . Pneumonia 05/18/2018  . Chronic respiratory failure with hypoxia ([place]) 01/12/2015  . COPD with acute exacerbation ([place]) 01/21/2013  . Hyperlipidemia 12/01/2011  . Hypertension 12/01/2011  . CAD (coronary artery disease) 05/23/2011  . Lung nodule 09/16/2008  . ALLERGIC RHINITIS 07/24/2007  . COPD mixed type ([place]) 07/24/2007  . BURSITIS 07/24/2007   Ruben Im, PT 06/07/19 6:49 PM Phone: [phone] Fax: [phone] Alvera Singh 06/07/2019, 6:48 PM  [facility] [address], [place], [place], [address] Phone: [phone]   Fax:  [phone]  Name: [name] MRN: [id number] Date of Birth: [birth year]

## 2019-06-07 NOTE — Patient Instructions (Signed)
Access Code: [email or web address]  URL: [email or web address]  Date: 06/07/2019  Prepared by: Ruben Im   Exercises  Seated Transversus Abdominis Bracing - 10 reps - 1 sets - 5 hold - 3x daily - 7x weekly  Seated Diagonal Chop with Medicine Ball - 10 reps - 1 sets - 1x daily - 7x weekly  Standing Heel Raise with Support - 10 reps - 1 sets - 1x daily - 7x weekly

## 2019-06-11 ENCOUNTER — Telehealth: Payer: Self-pay | Admitting: Hematology

## 2019-06-11 ENCOUNTER — Inpatient Hospital Stay: Payer: Medicare Other

## 2019-06-11 ENCOUNTER — Other Ambulatory Visit: Payer: Self-pay

## 2019-06-11 ENCOUNTER — Inpatient Hospital Stay: Payer: Medicare Other | Attending: Hematology | Admitting: Hematology

## 2019-06-11 VITALS — BP 150/70 | HR 80 | Temp 98.7°F | Resp 18 | Ht 68.0 in | Wt 146.8 lb

## 2019-06-11 DIAGNOSIS — D649 Anemia, unspecified: Secondary | ICD-10-CM | POA: Insufficient documentation

## 2019-06-11 DIAGNOSIS — E538 Deficiency of other specified B group vitamins: Secondary | ICD-10-CM

## 2019-06-11 DIAGNOSIS — I10 Essential (primary) hypertension: Secondary | ICD-10-CM | POA: Diagnosis not present

## 2019-06-11 DIAGNOSIS — Z79899 Other long term (current) drug therapy: Secondary | ICD-10-CM | POA: Insufficient documentation

## 2019-06-11 DIAGNOSIS — I4891 Unspecified atrial fibrillation: Secondary | ICD-10-CM | POA: Insufficient documentation

## 2019-06-11 LAB — C-REACTIVE PROTEIN: CRP: <0.8 mg/dL (ref <1.0)

## 2019-06-11 LAB — CBC WITH DIFFERENTIAL/PLATELET
Abs Immature Granulocytes: 0.02 10*3/uL (ref 0.00–0.07)
Basophils Absolute: 0 10*3/uL (ref 0.0–0.1)
Basophils Relative: 0 %
Eosinophils Absolute: 0.1 10*3/uL (ref 0.0–0.5)
Eosinophils Relative: 1 %
HCT: 37.2 % — ABNORMAL LOW (ref 39.0–52.0)
Hemoglobin: 11.8 g/dL — ABNORMAL LOW (ref 13.0–17.0)
Immature Granulocytes: 0 %
Lymphocytes Relative: 7 %
Lymphs Abs: 0.6 10*3/uL — ABNORMAL LOW (ref 0.7–4.0)
MCH: 28.8 pg (ref 26.0–34.0)
MCHC: 31.7 g/dL (ref 30.0–36.0)
MCV: 90.7 fL (ref 80.0–100.0)
Monocytes Absolute: 0.4 10*3/uL (ref 0.1–1.0)
Monocytes Relative: 4 %
Neutro Abs: 8.1 10*3/uL — ABNORMAL HIGH (ref 1.7–7.7)
Neutrophils Relative %: 88 %
Platelets: 226 10*3/uL (ref 150–400)
RBC: 4.1 MIL/uL — ABNORMAL LOW (ref 4.22–5.81)
RDW: 13.4 % (ref 11.5–15.5)
WBC: 9.2 10*3/uL (ref 4.0–10.5)
nRBC: 0 % (ref 0.0–0.2)

## 2019-06-11 LAB — IRON AND TIBC
Iron: 52 ug/dL (ref 42–163)
Saturation Ratios: 18 % — ABNORMAL LOW (ref 20–55)
TIBC: 285 ug/dL (ref 202–409)
UIBC: 233 ug/dL (ref 117–376)

## 2019-06-11 LAB — FERRITIN: Ferritin: 293 ng/mL (ref 24–336)

## 2019-06-11 LAB — SEDIMENTATION RATE: Sed Rate: 20 mm/hr — ABNORMAL HIGH (ref 0–16)

## 2019-06-11 LAB — VITAMIN B12: Vitamin B-12: 425 pg/mL (ref 180–914)

## 2019-06-11 MED ORDER — POVIDONE-IODINE 10 % EX OINT: 1.0000 "application " | TOPICAL_OINTMENT | CUTANEOUS | 0 refills | Status: DC | PRN

## 2019-06-11 NOTE — Patient Instructions (Signed)
Thank you for choosing [facility] to provide your oncology and hematology care.   Should you have questions after your visit to the [facility] ([facility]), please contact this office at [phone] between 8:30 AM and 4:30 PM. Voicemails left after 4:00 PM may not be returned until the following business day. Calls received after 4:30 PM will be answered by an off-site Nurse Triage Line.    Prescription Refills:  Please have your pharmacy contact us directly for most prescription requests.  Contact the office directly for refills of narcotics (pain medications). Allow 48-72 hours for refills.  Appointments: Please contact the [facility] scheduling department [phone] for questions regarding [facility] appointment scheduling.  Contact the schedulers with any scheduling changes so that your appointment can be rescheduled in a timely manner.   Central Scheduling for [place] [phone] - Call to schedule procedures such as PET scans, CT scans, MRI, Ultrasound, etc.  To afford each patient quality time with our providers, please arrive 30 minutes before your scheduled appointment time.  If you arrive late for your appointment, you may be asked to reschedule.  We strive to give you quality time with our providers, and arriving late affects you and other patients whose appointments are after yours. If you are a no show for multiple scheduled visits, you may be dismissed from the clinic at the providers discretion.     Resources: [facility] Social Workers [phone] for additional information on assistance programs --Anne Cunningham/Abigail Elmore  [place] DSS  [phone]: Information regarding food stamps, Medicaid, and utility assistance [employer] 336-333-6589   [place] [employer]'s shared-ride transportation service for eligible riders who have a disability that prevents them from riding the fixed route bus.   Medicare Rights Center 800-333-4114 Helps people with  Medicare understand their rights and benefits, navigate the Medicare system, and secure the quality healthcare they deserve [employer] 800-227-2345 Assists patients locate various types of support and financial assistance Cancer Care: 1-800-813-HOPE (4673) Provides financial assistance, online support groups, medication/co-pay assistance.      

## 2019-06-11 NOTE — Progress Notes (Signed)
HEMATOLOGY/ONCOLOGY CONSULTATION NOTE  Date of Service: 06/11/2019  Patient Care Team: Hulan Fess, MD as PCP - General (Family Medicine) Sherren Mocha, MD as PCP - Cardiology (Cardiology)  CHIEF COMPLAINTS/PURPOSE OF CONSULTATION:  Anemia  HISTORY OF PRESENTING ILLNESS:   [name] is a wonderful 84 y.o. male who has been referred to [place] by Dr Hulan Fess for evaluation and management of anemia.The pt reports that he is doing well.   The pt reports that he had a hernia repair surgery in June, three weeks later he fell and fractured his L1, which has caused him to very inactive. Although he is currently on oxygen he notes that he was having less difficulty breathing and more energy before the surgery and fall. Dr. Annamaria Boots, his pulmonologist wants him to return to pulmonary therapy. He does think that his back brace causes some issues with his breathing. Pt is trying to begin PT to increase the strength in his back. Pt dropped his oxygen tank on his lower right leg 3 weeks ago and caused a hematoma, the wound has now opened. Dr. Rex Kras set the pt up with an appointment at the wound clinic, on 11/13. He has been dressing his wound by placing Vaseline on a clean pad and then wrapping the area. Dr. Rex Kras has placed pt on oral antibiotics as he is allergic to Neosporin. He is not allergic to iodine or shellfish. He has a caregiver that comes in 3 days a week, who helps with breakfast and lunch. Pt eats 3 meal per day but does not have a strong appetite or desire to eat. His Afib has been stable and he is no longer taking Hydrocodone. His inhaler medications have not been changed recently.   Most recent lab results (05/24/2019) of CBC w/diff and CMP is as follows: all values are WNL except for WBC at 7.3K, RBC at 3.98, Hgb at 11.4, HCT at 34.6, MCV at 87.0, MCH at 28.8, MCHC at 33.1, RDW at 14.4, PLTs at 230K, MPV at 8.7, Neutro Rel at 82.3, Lymphs Rel at 10.0, Mono Rel at 5.8, Eos Rel at  1.3, Baso Rel at 0.6, Neutro Abs at 6.0K, Lymphs Abs at 0.70K, Mono Abs at 0.4K, Eos Abs at 0.1K, Baso Abs at 0.0K, nRBC Rel at 0.10, nRBC Abs at 0.01K, Glucose at 78, BUN at 15, Creatinine at 0.43, GFR Est Non Af Am at 189, Sodium at 137, Potassium at 5.0, Chloride at 97, CO2 at 36, Anion Gap at 9.5, Calcium at 9.4, CA-corrected at 9.46, Total Protein at 5.8, Albumin 3.9, TBIL at 0.5, ALP at 45, AST at 14, ALT at 13. 05/24/2019 Ferritin at 278.3  05/24/2019 Vitamin B12 at 359  On review of systems, pt reports SOB, fatigue, diminished appetite, back pain, lower right leg wound and denies abdominal pain, bleeding issues, difficulty urinating and any other symptoms.   On PMHx the pt reports Atrial Fibrillation, COPD.  MEDICAL HISTORY:  Past Medical History:  Diagnosis Date   Allergic rhinitis, cause unspecified    Arthritis    COPD (chronic obstructive pulmonary disease) (HCC)    Dyspnea    Dysrhythmia    "1 episode of tachycardia in 1980's, been on it ever since"   Emphysema of lung ([place])    Hypertension    Left inguinal hernia 02/07/2019   Neuromuscular disorder ([place])    Neuropathy    feet   Pneumonia    History of, last Oct 2019   Pulmonary nodule  SURGICAL HISTORY: Past Surgical History:  Procedure Laterality Date   APPENDECTOMY     CARDIAC CATHETERIZATION  2003   performed at [facility], Dr. Fransico Him   CATARACT EXTRACTION, [place] Left 02/07/2019   Procedure: OPEN REPAIR LEFT INGUINAL HERNIA WITH MESH;  Surgeon: Fanny Skates, MD;  Location: [facility];  Service: General;  Laterality: Left;  SPINAL AND TAP BLOCK ANESTHESIA   ROTATOR CUFF REPAIR     TONSILLECTOMY     VASECTOMY      SOCIAL HISTORY: Social History   Socioeconomic History   Marital status: Married    Spouse name: Not on file   Number of children: Not on file   Years of education: Not on file   Highest education level: Not on file    Occupational History   Not on file  Social Needs   Financial resource strain: Not on file   Food insecurity    Worry: Not on file    Inability: Not on file   Transportation needs    Medical: Not on file    Non-medical: Not on file  Tobacco Use   Smoking status: Former Smoker    Packs/day: 2.00    Years: 15.00    Pack years: 30.00    Types: Cigarettes    Quit date: 08/16/1990    Years since quitting: 28.8   Smokeless tobacco: Never Used  Substance and Sexual Activity   Alcohol use: Yes    Alcohol/week: 14.0 standard drinks    Types: 14 Shots of liquor per week    Comment: everyday vodka 2 drink a day   Drug use: No   Sexual activity: Not on file  Lifestyle   Physical activity    Days per week: Not on file    Minutes per session: Not on file   Stress: Not on file  Relationships   Social connections    Talks on phone: Not on file    Gets together: Not on file    Attends religious service: Not on file    Active member of club or organization: Not on file    Attends meetings of clubs or organizations: Not on file    Relationship status: Not on file   Intimate partner violence    Fear of current or ex partner: Not on file    Emotionally abused: Not on file    Physically abused: Not on file    Forced sexual activity: Not on file  Other Topics Concern   Not on file  Social History Narrative   Not on file    FAMILY HISTORY: Family History  Problem Relation Age of Onset   COPD Mother    Stroke Mother    Stroke Father    Coronary artery disease Other        [facility]   COPD Sister     ALLERGIES:  is allergic to tolectin [tolmetin sodium]; montelukast sodium; nsaids; augmentin [amoxicillin-pot clavulanate]; and neosporin [bacitracin-polymyxin b].  MEDICATIONS:  Current Outpatient Medications  Medication Sig Dispense Refill   albuterol (PROAIR HFA) 108 (90 BASE) MCG/ACT inhaler Inhale 2 puffs into the lungs 4 (four) times daily as needed for  wheezing or shortness of breath. 1 Inhaler 5   aspirin EC 81 MG tablet Take 1 tablet (81 mg total) by mouth daily.     digoxin (LANOXIN) 0.25 MG tablet Take 0.25 mg by mouth daily.  ipratropium-albuterol (DUONEB) 0.5-2.5 (3) MG/3ML SOLN Take 3 mLs by nebulization every 6 (six) hours as needed. (Patient taking differently: Take 3 mLs by nebulization every 6 (six) hours as needed (shortness of breath). ) 75 mL 12   ketoconazole (NIZORAL) 2 % shampoo Apply 1 application topically 2 (two) times a week.     losartan (COZAAR) 25 MG tablet TAKE 1 TABLET(25 MG) BY MOUTH DAILY 90 tablet 0   Nutritional Supplements (ENSURE HIGH PROTEIN) LIQD Take 1 Bottle by mouth daily.     OXYGEN Inhale 3-5 L into the lungs continuous.      polyethylene glycol powder (GLYCOLAX/MIRALAX) powder Take 17 g by mouth daily.     povidone-iodine (BETADINE) 10 % ointment Apply 1 application topically as needed for wound care. 30 g 0   pravastatin (PRAVACHOL) 40 MG tablet Take 40 mg by mouth daily.      Respiratory Therapy Supplies (FLUTTER) DEVI Use as directed 1 each 0   SPIRIVA RESPIMAT 2.5 MCG/ACT AERS INHALE 2 PUFFS BY MOUTH EVERY DAY (Patient taking differently: Inhale 2 puffs into the lungs daily. ) 4 g 11   SYMBICORT 160-4.5 MCG/ACT inhaler INHALE 2 PUFFS BY MOUTH INTO THE LUNGS TWICE DAILY. RINSE MOUTH 10.2 g 5   tamsulosin (FLOMAX) 0.4 MG CAPS capsule Take 0.4 mg by mouth daily.   2   HYDROcodone-acetaminophen (NORCO/VICODIN) 5-325 MG tablet Take 1 tablet by mouth every 6 (six) hours as needed. (Patient not taking: Reported on 06/05/2019) 15 tablet 0   ondansetron (ZOFRAN ODT) 4 MG disintegrating tablet Take 1 tablet (4 mg total) by mouth every 8 (eight) hours as needed for nausea. 10 tablet 0   No current facility-administered medications for this visit.     REVIEW OF SYSTEMS:    10 Point review of Systems was done is negative except as noted above.  PHYSICAL EXAMINATION: ECOG PERFORMANCE  STATUS: 2 - Symptomatic, <50% confined to bed  . Vitals:   06/11/19 1103  BP: (!) 150/70  Pulse: 80  Resp: 18  Temp: 98.7 F (37.1 C)  SpO2: 100%   Filed Weights   06/11/19 1103  Weight: 146 lb 12.8 oz (66.6 kg)   .Body mass index is 22.32 kg/m.  GENERAL:alert, in no acute distress and comfortable SKIN: no acute rashes, no significant lesions EYES: conjunctiva are pink and non-injected, sclera anicteric OROPHARYNX: MMM, no exudates, no oropharyngeal erythema or ulceration NECK: supple, no JVD LYMPH:  no palpable lymphadenopathy in the cervical, axillary or inguinal regions LUNGS: clear to auscultation b/l with normal respiratory effort HEART: regular rate & rhythm ABDOMEN:  normoactive bowel sounds , non tender, not distended. Extremity: 4 by 3 cm non-healing ulcer on the inner aspect of the lower part of the right leg with significant slough and mild seropurulent discharge. Wound edge with significant laceration.  PSYCH: alert & oriented x 3 with fluent speech NEURO: no focal motor/sensory deficits  LABORATORY DATA:  I have reviewed the data as listed  . CBC Latest Ref Rng & Units 06/11/2019 06/11/2019 02/28/2019  WBC 4.0 - 10.5 K/uL 9.2 - 9.9  Hemoglobin 13.0 - 17.0 g/dL 11.8(L) - 12.4(L)  Hematocrit 37.5 - 51.0 % 37.2(L) 36.4(L) 38.9(L)  Platelets 150 - 400 K/uL 226 - 209   . CBC    Component Value Date/Time   WBC 9.2 06/11/2019 1213   RBC 4.10 (L) 06/11/2019 1213   HGB 11.8 (L) 06/11/2019 1213   HCT 36.4 (L) 06/11/2019 1213   HCT 37.2 (L)  06/11/2019 1213   PLT 226 06/11/2019 1213   MCV 90.7 06/11/2019 1213   MCH 28.8 06/11/2019 1213   MCHC 31.7 06/11/2019 1213   RDW 13.4 06/11/2019 1213   LYMPHSABS 0.6 (L) 06/11/2019 1213   MONOABS 0.4 06/11/2019 1213   EOSABS 0.1 06/11/2019 1213   BASOSABS 0.0 06/11/2019 1213    . CMP Latest Ref Rng & Units 02/28/2019 02/07/2019 01/31/2019  Glucose 70 - 99 mg/dL 94 - 94  BUN 8 - 23 mg/dL 11 - 12  Creatinine 0.61 -  1.24 mg/dL 0.54(L) 0.60(L) 0.58(L)  Sodium 135 - 145 mmol/L 134(L) - 135  Potassium 3.5 - 5.1 mmol/L 4.5 - 4.4  Chloride 98 - 111 mmol/L 97(L) - 100  CO2 22 - 32 mmol/L 27 - 26  Calcium 8.9 - 10.3 mg/dL 9.0 - 9.1  Total Protein 6.5 - 8.1 g/dL 6.2(L) - -  Total Bilirubin 0.3 - 1.2 mg/dL 1.2 - -  Alkaline Phos 38 - 126 U/L 43 - -  AST 15 - 41 U/L 21 - -  ALT 0 - 44 U/L 17 - -     RADIOGRAPHIC STUDIES: I have personally reviewed the radiological images as listed and agreed with the findings in the report. No results found.  ASSESSMENT & PLAN:   1) Mild Normocytic Anemia - likely due to chronic inflammation from non healing leg ulcer and some blood loss from this.  PLAN: -Discussed patient's most recent labs from 05/24/2019, all values are WNL except for WBC at 7.3K, RBC at 3.98, Hgb at 11.4, HCT at 34.6, MCV at 87.0, MCH at 28.8, MCHC at 33.1, RDW at 14.4, PLTs at 230K, MPV at 8.7, Neutro Rel at 82.3, Lymphs Rel at 10.0, Mono Rel at 5.8, Eos Rel at 1.3, Baso Rel at 0.6, Neutro Abs at 6.0K, Lymphs Abs at 0.70K, Mono Abs at 0.4K, Eos Abs at 0.1K, Baso Abs at 0.0K, nRBC Rel at 0.10, nRBC Abs at 0.01K, Glucose at 78, BUN at 15, Creatinine at 0.43, GFR Est Non Af Am at 189, Sodium at 137, Potassium at 5.0, Chloride at 97, CO2 at 36, Anion Gap at 9.5, Calcium at 9.4, CA-corrected at 9.46, Total Protein at 5.8, Albumin 3.9, TBIL at 0.5, ALP at 45, AST at 14, ALT at 13. -Discussed 05/24/2019 Ferritin at 278.3  -Discussed 05/24/2019 Vitamin B12 at 359 -Recommended pt contact Dr. Hulan Fess for an urgent Wound Care Consultation  -Advised pt that he will need a surgical debridement for his wound  -Advised pt that his SOB/fatigue is likely multifactorial, including improper lung function, lack of activity, recent surgery, leg wound, back brace and poor nutrition -Rx betadine ointment -Will get labs today  -Will see back as needed   FOLLOW UP: Labs today RTC with Dr Irene Limbo as needed based on  labs  . Orders Placed This Encounter  Procedures   CBC with Differential/Platelet    Standing Status:   Future    Number of Occurrences:   1    Standing Expiration Date:   07/15/2020   Ferritin    Standing Status:   Future    Number of Occurrences:   1    Standing Expiration Date:   06/10/2020   Iron and TIBC    Standing Status:   Future    Number of Occurrences:   1    Standing Expiration Date:   06/10/2020   Sedimentation rate    Standing Status:   Future    Number of  Occurrences:   1    Standing Expiration Date:   06/10/2020   C-reactive protein    Standing Status:   Future    Number of Occurrences:   1    Standing Expiration Date:   06/10/2020   Vitamin B12    Standing Status:   Future    Number of Occurrences:   1    Standing Expiration Date:   06/10/2020   Folate RBC    Standing Status:   Future    Number of Occurrences:   1    Standing Expiration Date:   06/10/2020   Multiple Myeloma Panel (SPEP&IFE w/QIG)    Standing Status:   Future    Number of Occurrences:   1    Standing Expiration Date:   06/10/2020    All of the patients questions were answered with apparent satisfaction. The patient knows to call the clinic with any problems, questions or concerns.  I spent 30 mins counseling the patient face to face. The total time spent in the appointment was 45 minutes and more than 50% was on counseling and direct patient cares.    Sullivan Lone MD [place] AAHIVMS [facility] Hematology/Oncology Physician [facility]  (Office):       [phone] (Work cell):  [phone] (Fax):           [phone]  06/11/2019 12:04 PM  I, Yevette Edwards, am acting as a scribe for Dr. Sullivan Lone.   .I have reviewed the above documentation for accuracy and completeness, and I agree with the above. Brunetta Genera MD

## 2019-06-11 NOTE — Telephone Encounter (Signed)
Added lab per 10/26 los.  Called patient to inform him that his lab was added, pt was in the lobby waiting.  RTC with Dr Irene Limbo as needed based on labs per 10/26 los.

## 2019-06-12 ENCOUNTER — Encounter: Payer: Self-pay | Admitting: Physical Therapy

## 2019-06-12 ENCOUNTER — Ambulatory Visit: Payer: Medicare Other | Admitting: Physical Therapy

## 2019-06-12 DIAGNOSIS — M545 Low back pain, unspecified: Secondary | ICD-10-CM

## 2019-06-12 DIAGNOSIS — R2681 Unsteadiness on feet: Secondary | ICD-10-CM

## 2019-06-12 DIAGNOSIS — M6281 Muscle weakness (generalized): Secondary | ICD-10-CM | POA: Diagnosis not present

## 2019-06-12 DIAGNOSIS — R262 Difficulty in walking, not elsewhere classified: Secondary | ICD-10-CM

## 2019-06-12 LAB — FOLATE RBC
Folate, Hemolysate: 374 ng/mL
Folate, RBC: 1027 ng/mL (ref >498)
Hematocrit: 36.4 % — ABNORMAL LOW (ref 37.5–51.0)

## 2019-06-12 NOTE — Therapy (Signed)
[facility] [address] [place], [place], [address] Phone: [phone]   Fax:  [phone]  Physical Therapy Treatment  Patient Details  Name: [name] MRN: [id number] Date of Birth: August 09, 1935 Referring Provider (PT): Dr. Newman Pies   Encounter Date: 06/12/2019  PT End of Session - 06/12/19 1553    Visit Number  3    Date for PT Re-Evaluation  07/31/19    Authorization Type  Medicare 10th visit prog note:  KX at visit 15    PT Start Time  1145    PT Stop Time  1225    PT Time Calculation (min)  40 min    Activity Tolerance  Patient limited by fatigue;Other (comment)       Past Medical History:  Diagnosis Date  . Allergic rhinitis, cause unspecified   . Arthritis   . COPD (chronic obstructive pulmonary disease) ([place])   . Dyspnea   . Dysrhythmia    "1 episode of tachycardia in 1980's, been on it ever since"  . Emphysema of lung ([place])   . Hypertension   . Left inguinal hernia 02/07/2019  . Neuromuscular disorder ([place])   . Neuropathy    feet  . Pneumonia    History of, last Oct 2019  . Pulmonary nodule     Past Surgical History:  Procedure Laterality Date  . APPENDECTOMY    . CARDIAC CATHETERIZATION  2003   performed at [facility], Dr. Fransico Him  . CATARACT EXTRACTION, B[place]ATER[place]    . EYE SURGERY    . INGUINAL HERNIA REPAIR Left 02/07/2019   Procedure: OPEN REPAIR LEFT INGUINAL HERNIA WITH MESH;  Surgeon: Fanny Skates, MD;  Location: [facility];  Service: General;  Laterality: Left;  SPINAL AND TAP BLOCK ANESTHESIA  . ROTATOR CUFF REPAIR    . TONSILLECTOMY    . VASECTOMY      There were no vitals filed for this visit.  Subjective Assessment - 06/12/19 1150    Subjective  Did Ok after last time.  I've been walking some without my walker and feeling more confident.  I didn't sleep very well so I'm not doing well today.  Saw the hemotologist yesterday.  He was concerned about my hematoma on my  leg and wants me to be seen.  He wants me to have a [place] but that would affect my outpatient rehab.    Pertinent History  wife can't help me, she has her own problems;  O2 full time;  RW full time;  has a home stair lift;  had pulmonary rehab 1 year ago;  he would like to get stronger to do pulmonary rehab after PT;  wearing brace but pt states the doctor said it was up to me whether to wear it or not    Patient Stated Goals  I want to be done with the walker.  be comfortable walking and deceased fear of falling    Currently in Pain?  No/denies    Pain Score  0-No pain    Pain Location  Back                       [facility] Adult PT Treatment/Exercise - 06/12/19 0001      Ambulation/Gait   Gait Comments  RW with assist for O2 8feet x3 with rest breaks      Therapeutic Activites    Therapeutic Activities  ADL's    ADL's  walking, standing,  curbs/steps, getting in/out of the car      Lumbar Exercises: Stretches   Other Lumbar Stretch Exercise  2nd step hip flexor stretch 5x right/left       Lumbar Exercises: Aerobic   Nustep  3 rounds of 90 sec increments    98% O2 sat with 1 min rest break      Lumbar Exercises: Standing   Heel Raises  10 reps      Lumbar Exercises: Seated   Sit to Stand Limitations  foam roll push down 10x     Other Seated Lumbar Exercises  red band rows 20x    Other Seated Lumbar Exercises  red band extensions 20x       Knee/Hip Exercises: Standing   Other Standing Knee Exercises  step taps 30 sec    95% 81 HR      Knee/Hip Exercises: Seated   Ball Squeeze  20x    Clamshell with TheraBand  Green   20x               PT Short Term Goals - 06/05/19 2015      PT SHORT TERM GOAL #1   Title  independent with initial HEP    Time  4    Period  Weeks    Status  New    Target Date  07/03/19      PT SHORT TERM GOAL #2   Title  The patient will be able to ambulate 240 feet with pain level 4/10    Time  4    Period  Weeks    Status   New      PT SHORT TERM GOAL #3   Title  Timed up and Go speed  improved to 17.5 sec or less    Time  4    Period  Weeks    Status  New      PT SHORT TERM GOAL #4   Title  BERG test improved from 42/56 to 45/56    Time  4    Period  Weeks    Status  New        PT Long Term Goals - 06/05/19 2018      PT LONG TERM GOAL #1   Title  be independent in advanced HEP    Time  8    Period  Weeks    Status  New    Target Date  07/31/19      PT LONG TERM GOAL #2   Title  BERG balance test improved to 47/56 indicating improved gait safety and decreased risk of falls    Time  8    Period  Weeks    Status  New      PT LONG TERM GOAL #3   Title  The patient will be able to walk for 5 minutes with pain level 4/10 or less    Time  8    Period  Weeks    Status  New      PT LONG TERM GOAL #4   Title  Timed up and go speed improved to 16.5 sec or less    Time  8    Period  Weeks    Status  New      PT LONG TERM GOAL #5   Title  Activities Based Specific Scale (ABC) will indicate decreased fearfulness of falling from initial assessment    Time  8    Period  Weeks  Status  New            Plan - 06/12/19 1551    Clinical Impression Statement  The patient reports he is generally not feeling well today which he attributes to poor sleep.  He requires modification of activity and increased rest breaks today although his O2 saturation levels remain high at 97-98%.  He requires assistance with handling his O2 tank while ambulating with his RW and getting in/out of the car.  Close monitoring needed for excessive shortness of breath, O2 saturation and HR.    Personal Factors and Comorbidities  Age;Past/Current Experience;Comorbidity 1;Comorbidity 2;Comorbidity 3+;Fitness;Time since onset of injury/illness/exacerbation    Comorbidities  COPD, O2 dependent, CAD, HTN, chronic LBP    Examination-Activity Limitations  Bend;Carry;Stairs;Lift;Stand;Locomotion Level;Hygiene/Grooming;Dressing     PT Frequency  2x / week    PT Duration  8 weeks    PT Treatment/Interventions  ADLs/Self Care Home Management;Gait training;Functional mobility training;Therapeutic activities;Therapeutic exercise;Balance training;Neuromuscular re-education;Manual techniques;Patient/family education;Taping;Dry needling    PT Next Visit Plan  Nu-Step;  short bouts of seated and standing ex with monitoring of O2 sats;  walking laps;  add to HEP    PT Home Exercise Plan  Access Code: [email or web address]    Recommended Other Services  cert signed       Patient will benefit from skilled therapeutic intervention in order to improve the following deficits and impairments:  Difficulty walking, Cardiopulmonary status limiting activity, Decreased endurance, Decreased activity tolerance, Impaired perceived functional ability, Pain, Decreased balance, Decreased strength  Visit Diagnosis: Acute midline low back pain without sciatica  Muscle weakness (generalized)  Difficulty in walking, not elsewhere classified  Unsteadiness on feet     Problem List Patient Active Problem List   Diagnosis Date Noted  . Left inguinal hernia 02/07/2019  . Left leg pain 05/18/2018  . Pneumonia 05/18/2018  . Chronic respiratory failure with hypoxia ([place]) 01/12/2015  . COPD with acute exacerbation ([place]) 01/21/2013  . Hyperlipidemia 12/01/2011  . Hypertension 12/01/2011  . CAD (coronary artery disease) 05/23/2011  . Lung nodule 09/16/2008  . ALLERGIC RHINITIS 07/24/2007  . COPD mixed type ([place]) 07/24/2007  . BURSITIS 07/24/2007   Ruben Im, PT 06/12/19 4:00 PM Phone: [phone] Fax: [phone] Alvera Singh 06/12/2019, 4:00 PM  [facility] [address], [place], [place], [address] Phone: [phone]   Fax:  [phone]  Name: [name] MRN: [id number] Date of Birth: 04/16/1935

## 2019-06-13 LAB — MULTIPLE MYELOMA PANEL, SERUM
Albumin SerPl Elph-Mcnc: 4 g/dL (ref 2.9–4.4)
Albumin/Glob SerPl: 1.8 — ABNORMAL HIGH (ref 0.7–1.7)
Alpha 1: 0.3 g/dL (ref 0.0–0.4)
Alpha2 Glob SerPl Elph-Mcnc: 0.8 g/dL (ref 0.4–1.0)
B-Globulin SerPl Elph-Mcnc: 0.9 g/dL (ref 0.7–1.3)
Gamma Glob SerPl Elph-Mcnc: 0.3 g/dL — ABNORMAL LOW (ref 0.4–1.8)
Globulin, Total: 2.3 g/dL (ref 2.2–3.9)
IgA: 288 mg/dL (ref 61–437)
IgG (Immunoglobin G), Serum: 473 mg/dL — ABNORMAL LOW (ref 603–1613)
IgM (Immunoglobulin M), Srm: 32 mg/dL (ref 15–143)
Total Protein ELP: 6.3 g/dL (ref 6.0–8.5)

## 2019-06-14 ENCOUNTER — Other Ambulatory Visit: Payer: Self-pay

## 2019-06-14 ENCOUNTER — Ambulatory Visit: Payer: Medicare Other | Admitting: Physical Therapy

## 2019-06-14 ENCOUNTER — Encounter: Payer: Self-pay | Admitting: Physical Therapy

## 2019-06-14 DIAGNOSIS — L989 Disorder of the skin and subcutaneous tissue, unspecified: Secondary | ICD-10-CM | POA: Diagnosis not present

## 2019-06-14 DIAGNOSIS — R2681 Unsteadiness on feet: Secondary | ICD-10-CM | POA: Diagnosis not present

## 2019-06-14 DIAGNOSIS — R262 Difficulty in walking, not elsewhere classified: Secondary | ICD-10-CM

## 2019-06-14 DIAGNOSIS — M545 Low back pain, unspecified: Secondary | ICD-10-CM

## 2019-06-14 DIAGNOSIS — M6281 Muscle weakness (generalized): Secondary | ICD-10-CM | POA: Diagnosis not present

## 2019-06-14 NOTE — Therapy (Signed)
[facility] [address], [place], [place], [address] Phone: [phone]   Fax:  [phone]  Physical Therapy Treatment  Patient Details  Name: [name] MRN: [id number] Date of Birth: [birth year] Referring Provider (PT): Dr. Newman Pies   Encounter Date: 06/14/2019  PT End of Session - 06/14/19 1735    Visit Number  4    Date for PT Re-Evaluation  07/31/19    Authorization Type  Medicare 10th visit prog note:  KX at visit 15    PT Start Time  1153    PT Stop Time  1232    PT Time Calculation (min)  39 min    Activity Tolerance  Patient limited by fatigue;Other (comment)       Past Medical History:  Diagnosis Date  . Allergic rhinitis, cause unspecified   . Arthritis   . COPD (chronic obstructive pulmonary disease) ([place])   . Dyspnea   . Dysrhythmia    "1 episode of tachycardia in 1980's, been on it ever since"  . Emphysema of lung ([place])   . Hypertension   . Left inguinal hernia 02/07/2019  . Neuromuscular disorder ([place])   . Neuropathy    feet  . Pneumonia    History of, last Oct 2019  . Pulmonary nodule     Past Surgical History:  Procedure Laterality Date  . APPENDECTOMY    . CARDIAC CATHETERIZATION  2003   performed at [facility], Dr. Fransico Him  . CATARACT EXTRACTION, BILATERAL    . EYE SURGERY    . INGUINAL HERNIA REPAIR Left 02/07/2019   Procedure: OPEN REPAIR LEFT INGUINAL HERNIA WITH MESH;  Surgeon: Fanny Skates, MD;  Location: [place];  Service: General;  Laterality: Left;  SPINAL AND TAP BLOCK ANESTHESIA  . ROTATOR CUFF REPAIR    . TONSILLECTOMY    . VASECTOMY      There were no vitals filed for this visit.  Subjective Assessment - 06/14/19 1201    Subjective  My back is OK today.  I got some sleep.    Pertinent History  wife can't help me, she has her own problems;  O2 full time;  RW full time;  has a home stair lift;  had pulmonary rehab 1 year ago;  he would like to get  stronger to do pulmonary rehab after PT;  wearing brace but pt states the doctor said it was up to me whether to wear it or not    Patient Stated Goals  I want to be done with the walker.  be comfortable walking and deceased fear of falling    Currently in Pain?  No/denies    Pain Score  0-No pain    Pain Location  Back    Pain Type  Chronic pain                       [facility] Adult PT Treatment/Exercise - 06/14/19 0001      Therapeutic Activites    Therapeutic Activities  Lifting    ADL's  walking, standing, curbs/steps, getting in/out of the car    Lifting  instructed in hip hinge with 3 points of contact 10x      Lumbar Exercises: Stretches   Active Hamstring Stretch  Right;Left;5 reps    Active Hamstring Stretch Limitations  2nd step     Other Lumbar Stretch Exercise  2nd step hip flexor stretch 5x right/left  Lumbar Exercises: Standing   Heel Raises  10 reps    Heel Raises Limitations  and toes       Lumbar Exercises: Seated   Sit to Stand Limitations  thoracic extension with ball behind back 10x     Other Seated Lumbar Exercises  red band rows 20x    Other Seated Lumbar Exercises  red band extensions 20x       Knee/Hip Exercises: Standing   Forward Step Up  Right;Left;2 sets;5 reps;Hand Hold: 2;Step Height: 2"    Other Standing Knee Exercises  step taps 10x     Other Standing Knee Exercises  small squats holiding 5 # weight       Knee/Hip Exercises: Seated   Long Arc Quad Limitations  red band     Clamshell with TheraBand  Blue   20x    Other Seated Knee/Hip Exercises  2# seated overhead  press 10x each side    Sit to Sand  2 sets;5 reps   2nd set on black foam              PT Short Term Goals - 06/05/19 2015      PT SHORT TERM GOAL #1   Title  independent with initial HEP    Time  4    Period  Weeks    Status  New    Target Date  07/03/19      PT SHORT TERM GOAL #2   Title  The patient will be able to ambulate 240 feet with pain  level 4/10    Time  4    Period  Weeks    Status  New      PT SHORT TERM GOAL #3   Title  Timed up and Go speed  improved to 17.5 sec or less    Time  4    Period  Weeks    Status  New      PT SHORT TERM GOAL #4   Title  BERG test improved from 42/56 to 45/56    Time  4    Period  Weeks    Status  New        PT Long Term Goals - 06/05/19 2018      PT LONG TERM GOAL #1   Title  be independent in advanced HEP    Time  8    Period  Weeks    Status  New    Target Date  07/31/19      PT LONG TERM GOAL #2   Title  BERG balance test improved to 47/56 indicating improved gait safety and decreased risk of falls    Time  8    Period  Weeks    Status  New      PT LONG TERM GOAL #3   Title  The patient will be able to walk for 5 minutes with pain level 4/10 or less    Time  8    Period  Weeks    Status  New      PT LONG TERM GOAL #4   Title  Timed up and go speed improved to 16.5 sec or less    Time  8    Period  Weeks    Status  New      PT LONG TERM GOAL #5   Title  Activities Based Specific Scale (ABC) will indicate decreased fearfulness of falling from initial assessment    Time  8  Period  Weeks    Status  New            Plan - 06/14/19 1736    Clinical Impression Statement  The patient continues to be limited with standing activity tolerance secondary to shortness of breath/decreased O2 saturation levels.  Spot checks done every 10 minutes with levels decreasing to 90% at times but > 95% with seated rest breaks.  Increased thoracic kyphosis in sitting and standing. Frequent cues for better sitting posture as well as head up when standing.  He is eager to progress to walking with a 3 point cane however his standing and walking tolerance is so limited that I recommend waiting a bit longer.    Personal Factors and Comorbidities  Age;Past/Current Experience;Comorbidity 1;Comorbidity 2;Comorbidity 3+;Fitness;Time since onset of injury/illness/exacerbation     Comorbidities  COPD, O2 dependent, CAD, HTN, chronic LBP    Stability/Clinical Decision Making  Evolving/Moderate complexity    Rehab Potential  Good    PT Frequency  2x / week    PT Duration  8 weeks    PT Treatment/Interventions  ADLs/Self Care Home Management;Gait training;Functional mobility training;Therapeutic activities;Therapeutic exercise;Balance training;Neuromuscular re-education;Manual techniques;Patient/family education;Taping;Dry needling    PT Next Visit Plan  Nu-Step;  short bouts of seated and standing ex with monitoring of O2 sats;  try walking at counter with weight in one hand or carrying O2 with single arm support;  balance ex ;  Practice hip hinge with 3 points of contact   PT Home Exercise Plan  Access Code: [email or web address]       Patient will benefit from skilled therapeutic intervention in order to improve the following deficits and impairments:  Difficulty walking, Cardiopulmonary status limiting activity, Decreased endurance, Decreased activity tolerance, Impaired perceived functional ability, Pain, Decreased balance, Decreased strength  Visit Diagnosis: Acute midline low back pain without sciatica  Muscle weakness (generalized)  Difficulty in walking, not elsewhere classified  Unsteadiness on feet     Problem List Patient Active Problem List   Diagnosis Date Noted  . Left inguinal hernia 02/07/2019  . Left leg pain 05/18/2018  . Pneumonia 05/18/2018  . Chronic respiratory failure with hypoxia ([place]) 01/12/2015  . COPD with acute exacerbation ([place]) 01/21/2013  . Hyperlipidemia 12/01/2011  . Hypertension 12/01/2011  . CAD (coronary artery disease) 05/23/2011  . Lung nodule 09/16/2008  . ALLERGIC RHINITIS 07/24/2007  . COPD mixed type ([place]) 07/24/2007  . BURSITIS 07/24/2007   Ruben Im, PT 06/14/19 5:45 PM Phone: [phone] Fax: [phone] Alvera Singh 06/14/2019, 5:44 PM  [facility] [address] [place], [place], [address] Phone: [phone]   Fax:  [phone]  Name: [name] MRN: [id number] Date of Birth: Dec 17, 1934

## 2019-06-19 ENCOUNTER — Other Ambulatory Visit: Payer: Self-pay

## 2019-06-19 ENCOUNTER — Ambulatory Visit: Payer: Medicare Other | Attending: Neurosurgery | Admitting: Physical Therapy

## 2019-06-19 ENCOUNTER — Encounter: Payer: Self-pay | Admitting: Physical Therapy

## 2019-06-19 DIAGNOSIS — M545 Low back pain, unspecified: Secondary | ICD-10-CM

## 2019-06-19 DIAGNOSIS — R2681 Unsteadiness on feet: Secondary | ICD-10-CM | POA: Diagnosis not present

## 2019-06-19 DIAGNOSIS — M6281 Muscle weakness (generalized): Secondary | ICD-10-CM | POA: Insufficient documentation

## 2019-06-19 DIAGNOSIS — R262 Difficulty in walking, not elsewhere classified: Secondary | ICD-10-CM | POA: Insufficient documentation

## 2019-06-19 NOTE — Therapy (Signed)
[facility] [address] [place], [place], [address] Phone: [phone]   Fax:  [phone]  Physical Therapy Treatment  Patient Details  Name: [name] MRN: [id number] Date of Birth: 1935/01/19 Referring Provider (PT): Dr. Newman Pies   Encounter Date: 06/19/2019  PT End of Session - 06/19/19 1706    Visit Number  5    Date for PT Re-Evaluation  07/31/19    Authorization Type  Medicare 10th visit prog note:  KX at visit 15    PT Start Time  1146    PT Stop Time  1228    PT Time Calculation (min)  42 min    Activity Tolerance  Patient limited by fatigue;Other (comment)       Past Medical History:  Diagnosis Date  . Allergic rhinitis, cause unspecified   . Arthritis   . COPD (chronic obstructive pulmonary disease) ([place])   . Dyspnea   . Dysrhythmia    "1 episode of tachycardia in 1980's, been on it ever since"  . Emphysema of lung ([place])   . Hypertension   . Left inguinal hernia 02/07/2019  . Neuromuscular disorder ([place])   . Neuropathy    feet  . Pneumonia    History of, last Oct 2019  . Pulmonary nodule     Past Surgical History:  Procedure Laterality Date  . APPENDECTOMY    . CARDIAC CATHETERIZATION  2003   performed at [facility], Dr. Fransico Him  . CATARACT EXTRACTION, BILATERAL    . EYE SURGERY    . INGUINAL HERNIA REPAIR Left 02/07/2019   Procedure: OPEN REPAIR LEFT INGUINAL HERNIA WITH MESH;  Surgeon: Fanny Skates, MD;  Location: [place];  Service: General;  Laterality: Left;  SPINAL AND TAP BLOCK ANESTHESIA  . ROTATOR CUFF REPAIR    . TONSILLECTOMY    . VASECTOMY      There were no vitals filed for this visit.  Subjective Assessment - 06/19/19 1152    Subjective  We overdid it last time.  My back was sore.  I'm winded b/c I had to get out of the car unassisted today.    Pertinent History  wife can't help me, she has her own problems;  O2 full time;  RW full time;  has a home stair  lift;  had pulmonary rehab 1 year ago;  he would like to get stronger to do pulmonary rehab after PT;  wearing brace but pt states the doctor said it was up to me whether to wear it or not    Patient Stated Goals  I want to be done with the walker.  be comfortable walking and deceased fear of falling    Currently in Pain?  Yes    Pain Score  3     Pain Location  Back                       [facility] Adult PT Treatment/Exercise - 06/19/19 0001      Ambulation/Gait   Gait Comments  RW 60 feet with assist for O2      Self-Care   Self-Care  Posture    Posture  discussed importance of sitting erect;at patient's request discussed spinal anatomy with model to visualize L1       Therapeutic Activites    Therapeutic Activities  Lifting    ADL's  walking, standing, curbs/steps, getting in/out of the car    Lifting  instructed in  hip hinge with 3 points of contact 10x      Lumbar Exercises: Stretches   Other Lumbar Stretch Exercise  2nd step hip flexor stretch 5x right/left       Lumbar Exercises: Aerobic   Nustep  1 minute x3       Lumbar Exercises: Standing   Heel Raises  10 reps      Knee/Hip Exercises: Machines for Strengthening   Cybex Leg Press  50# bil 2x 10 reclined back seat 9       Knee/Hip Exercises: Standing   Hip Abduction  AROM;Right;Left;10 reps    Hip Extension  AROM;Right;Left;10 reps    Extension Limitations  bil UE support     Other Standing Knee Exercises  step taps 10x                PT Short Term Goals - 06/05/19 2015      PT SHORT TERM GOAL #1   Title  independent with initial HEP    Time  4    Period  Weeks    Status  New    Target Date  07/03/19      PT SHORT TERM GOAL #2   Title  The patient will be able to ambulate 240 feet with pain level 4/10    Time  4    Period  Weeks    Status  New      PT SHORT TERM GOAL #3   Title  Timed up and Go speed  improved to 17.5 sec or less    Time  4    Period  Weeks    Status  New       PT SHORT TERM GOAL #4   Title  BERG test improved from 42/56 to 45/56    Time  4    Period  Weeks    Status  New        PT Long Term Goals - 06/05/19 2018      PT LONG TERM GOAL #1   Title  be independent in advanced HEP    Time  8    Period  Weeks    Status  New    Target Date  07/31/19      PT LONG TERM GOAL #2   Title  BERG balance test improved to 47/56 indicating improved gait safety and decreased risk of falls    Time  8    Period  Weeks    Status  New      PT LONG TERM GOAL #3   Title  The patient will be able to walk for 5 minutes with pain level 4/10 or less    Time  8    Period  Weeks    Status  New      PT LONG TERM GOAL #4   Title  Timed up and go speed improved to 16.5 sec or less    Time  8    Period  Weeks    Status  New      PT LONG TERM GOAL #5   Title  Activities Based Specific Scale (ABC) will indicate decreased fearfulness of falling from initial assessment    Time  8    Period  Weeks    Status  New            Plan - 06/19/19 1706    Clinical Impression Statement  The patient is limited in exercise tolerance secondary to shortness of  breath.  O2 saturation closely monitored with rest breaks taken when levels drop below 93%.  Poor sitting posture (sacral sitting) and extensive education provided on why this could be an aggravating factor for LBP and slow healing.  Verbal cues for postural correction particularly in sitting. Treatment modified secondary to patient's respiratory status but no complaints of changes in low back pain.    Personal Factors and Comorbidities  Age;Past/Current Experience;Comorbidity 1;Comorbidity 2;Comorbidity 3+;Fitness;Time since onset of injury/illness/exacerbation    Comorbidities  COPD, O2 dependent, CAD, HTN, chronic LBP    Examination-Activity Limitations  Bend;Carry;Stairs;Lift;Stand;Locomotion Level;Hygiene/Grooming;Dressing    Examination-Participation Restrictions  Community Activity;Driving;Other     Stability/Clinical Decision Making  Evolving/Moderate complexity    Rehab Potential  Good    PT Frequency  2x / week    PT Duration  8 weeks    PT Treatment/Interventions  ADLs/Self Care Home Management;Gait training;Functional mobility training;Therapeutic activities;Therapeutic exercise;Balance training;Neuromuscular re-education;Manual techniques;Patient/family education;Taping;Dry needling    PT Next Visit Plan  Nu-Step;  short bouts of seated and standing ex with monitoring of O2 sats;  try walking at counter with weight in one hand or carrying O2 with single arm support;  balance ex    PT Home Exercise Plan  Access Code: [email or web address]       Patient will benefit from skilled therapeutic intervention in order to improve the following deficits and impairments:  Difficulty walking, Cardiopulmonary status limiting activity, Decreased endurance, Decreased activity tolerance, Impaired perceived functional ability, Pain, Decreased balance, Decreased strength  Visit Diagnosis: Acute midline low back pain without sciatica  Muscle weakness (generalized)  Difficulty in walking, not elsewhere classified  Unsteadiness on feet     Problem List Patient Active Problem List   Diagnosis Date Noted  . Left inguinal hernia 02/07/2019  . Left leg pain 05/18/2018  . Pneumonia 05/18/2018  . Chronic respiratory failure with hypoxia ([place]) 01/12/2015  . COPD with acute exacerbation ([place]) 01/21/2013  . Hyperlipidemia 12/01/2011  . Hypertension 12/01/2011  . CAD (coronary artery disease) 05/23/2011  . Lung nodule 09/16/2008  . ALLERGIC RHINITIS 07/24/2007  . COPD mixed type ([place]) 07/24/2007  . BURSITIS 07/24/2007   Ruben Im, PT 06/19/19 5:14 PM Phone: [phone] Fax: [phone] Alvera Singh 06/19/2019, 5:14 PM  [facility] [address], [place], [place], [address] Phone: [phone]   Fax:  [phone]  Name: [name] MRN: [id number] Date of Birth: 03/06/1935

## 2019-06-21 ENCOUNTER — Ambulatory Visit: Payer: Medicare Other | Admitting: Physical Therapy

## 2019-06-21 ENCOUNTER — Other Ambulatory Visit: Payer: Self-pay

## 2019-06-21 DIAGNOSIS — R262 Difficulty in walking, not elsewhere classified: Secondary | ICD-10-CM

## 2019-06-21 DIAGNOSIS — R2681 Unsteadiness on feet: Secondary | ICD-10-CM

## 2019-06-21 DIAGNOSIS — M545 Low back pain, unspecified: Secondary | ICD-10-CM

## 2019-06-21 DIAGNOSIS — M6281 Muscle weakness (generalized): Secondary | ICD-10-CM | POA: Diagnosis not present

## 2019-06-21 NOTE — Therapy (Signed)
[facility] [address] [place], [place], [address] Phone: [phone]   Fax:  [phone]  Physical Therapy Treatment  Patient Details  Name: [name] MRN: [id number] Date of Birth: 12-03-1934 Referring Provider (PT): Dr. Newman Pies   Encounter Date: 06/21/2019  PT End of Session - 06/21/19 1234    Visit Number  6    Date for PT Re-Evaluation  07/31/19    Authorization Type  Medicare 10th visit prog note:  KX at visit 15    PT Start Time  1147    PT Stop Time  1226    PT Time Calculation (min)  39 min    Activity Tolerance  Patient limited by fatigue;Other (comment)       Past Medical History:  Diagnosis Date  . Allergic rhinitis, cause unspecified   . Arthritis   . COPD (chronic obstructive pulmonary disease) ([place])   . Dyspnea   . Dysrhythmia    "1 episode of tachycardia in 1980's, been on it ever since"  . Emphysema of lung ([place])   . Hypertension   . Left inguinal hernia 02/07/2019  . Neuromuscular disorder ([place])   . Neuropathy    feet  . Pneumonia    History of, last Oct 2019  . Pulmonary nodule     Past Surgical History:  Procedure Laterality Date  . APPENDECTOMY    . CARDIAC CATHETERIZATION  2003   performed at [facility], Dr. Fransico Him  . CATARACT EXTRACTION, BILATERAL    . EYE SURGERY    . INGUINAL HERNIA REPAIR Left 02/07/2019   Procedure: OPEN REPAIR LEFT INGUINAL HERNIA WITH MESH;  Surgeon: Fanny Skates, MD;  Location: [place];  Service: General;  Laterality: Left;  SPINAL AND TAP BLOCK ANESTHESIA  . ROTATOR CUFF REPAIR    . TONSILLECTOMY    . VASECTOMY      There were no vitals filed for this visit.  Subjective Assessment - 06/21/19 1151    Subjective  My back wasn't as sore this time.  I did OK.  Just a little back pain today, nothing significant.    Pertinent History  wife can't help me, she has her own problems;  O2 full time;  RW full time;  has a home stair lift;  had  pulmonary rehab 1 year ago;  he would like to get stronger to do pulmonary rehab after PT;  wearing brace but pt states the doctor said it was up to me whether to wear it or not    Patient Stated Goals  I want to be done with the walker.  be comfortable walking and deceased fear of falling    Currently in Pain?  Yes    Pain Score  1     Pain Location  Back    Pain Type  Chronic pain                       [facility] Adult PT Treatment/Exercise - 06/21/19 0001      Ambulation/Gait   Gait Comments  walking with single arm support on counter while carrying 2# weight and with patient carrying O2 tank, sidestepping along counter.        Self-Care   Posture  use of lumbar roll for sitting posture improvement       Therapeutic Activites    ADL's  walking, standing, curbs/steps, getting in/out of the car      Lumbar Exercises: Aerobic  Nustep  1 minute x4 while discussing progress       Lumbar Exercises: Standing   Heel Raises  --      Knee/Hip Exercises: Standing   Heel Raises  Both;10 reps    Hip Abduction  AROM;Right;Left;10 reps    Hip Extension  AROM;Right;Left;10 reps    Extension Limitations  bil UE support     Gait Training  marching 5 reps     Other Standing Knee Exercises  stepping laterally over object, forward over object 5x each    Other Standing Knee Exercises  minisquats 5x; mini lunges to side 5x each       Knee/Hip Exercises: Seated   Other Seated Knee/Hip Exercises  red band rows 10x    Other Seated Knee/Hip Exercises  red band shoulder extensions 10x     Sit to Sand  2 sets;5 reps   high mat table               PT Short Term Goals - 06/05/19 2015      PT SHORT TERM GOAL #1   Title  independent with initial HEP    Time  4    Period  Weeks    Status  New    Target Date  07/03/19      PT SHORT TERM GOAL #2   Title  The patient will be able to ambulate 240 feet with pain level 4/10    Time  4    Period  Weeks    Status  New      PT  SHORT TERM GOAL #3   Title  Timed up and Go speed  improved to 17.5 sec or less    Time  4    Period  Weeks    Status  New      PT SHORT TERM GOAL #4   Title  BERG test improved from 42/56 to 45/56    Time  4    Period  Weeks    Status  New        PT Long Term Goals - 06/05/19 2018      PT LONG TERM GOAL #1   Title  be independent in advanced HEP    Time  8    Period  Weeks    Status  New    Target Date  07/31/19      PT LONG TERM GOAL #2   Title  BERG balance test improved to 47/56 indicating improved gait safety and decreased risk of falls    Time  8    Period  Weeks    Status  New      PT LONG TERM GOAL #3   Title  The patient will be able to walk for 5 minutes with pain level 4/10 or less    Time  8    Period  Weeks    Status  New      PT LONG TERM GOAL #4   Title  Timed up and go speed improved to 16.5 sec or less    Time  8    Period  Weeks    Status  New      PT LONG TERM GOAL #5   Title  Activities Based Specific Scale (ABC) will indicate decreased fearfulness of falling from initial assessment    Time  8    Period  Weeks    Status  New  Plan - 06/21/19 1235    Clinical Impression Statement  Patient continues to be very limited with exercise tolerance secondary to shortness of breath related to COPD but also worsened by mask wearing.  Discussed regular compliance with standing ex's at the counter at home where he would not have to wear a mask.  His plan is to be able to resume his pulmonary rehab but he needs further strengthening before he is able to do so.  His ability to do stand is 2 minutes.  He also needs rest breaks with seated aerobic ex.  Therapist checking O2 saturation periodically with variable levels temporarily dropping below 90%.  No loss of balance with walking along counter with single arm assist while carrying O2 tank however patient reports feeling unsteady.  Moderate verbal and tactile cues to avoid sacral/slouch sitting  and holding head upright.    Comorbidities  COPD, O2 dependent, CAD, HTN, chronic LBP    Examination-Participation Restrictions  Community Activity;Driving;Other    Rehab Potential  Good    PT Frequency  2x / week    PT Duration  8 weeks    PT Treatment/Interventions  ADLs/Self Care Home Management;Gait training;Functional mobility training;Therapeutic activities;Therapeutic exercise;Balance training;Neuromuscular re-education;Manual techniques;Patient/family education;Taping;Dry needling    PT Next Visit Plan  Check STGs;  Nu-Step;  short bouts of seated and standing ex with monitoring of O2 sats; walking at counter with weight in one hand or carrying O2 with single arm support;  balance ex    PT Home Exercise Plan  Access Code: [email or web address]       Patient will benefit from skilled therapeutic intervention in order to improve the following deficits and impairments:  Difficulty walking, Cardiopulmonary status limiting activity, Decreased endurance, Decreased activity tolerance, Impaired perceived functional ability, Pain, Decreased balance, Decreased strength  Visit Diagnosis: Acute midline low back pain without sciatica  Muscle weakness (generalized)  Difficulty in walking, not elsewhere classified  Unsteadiness on feet     Problem List Patient Active Problem List   Diagnosis Date Noted  . Left inguinal hernia 02/07/2019  . Left leg pain 05/18/2018  . Pneumonia 05/18/2018  . Chronic respiratory failure with hypoxia ([place]) 01/12/2015  . COPD with acute exacerbation ([place]) 01/21/2013  . Hyperlipidemia 12/01/2011  . Hypertension 12/01/2011  . CAD (coronary artery disease) 05/23/2011  . Lung nodule 09/16/2008  . ALLERGIC RHINITIS 07/24/2007  . COPD mixed type ([place]) 07/24/2007  . BURSITIS 07/24/2007   Ruben Im, PT 06/21/19 12:50 PM Phone: [phone] Fax: [phone] Alvera Singh 06/21/2019, 12:50 PM  [facility] [address], [place], [place], [address] Phone: [phone]   Fax:  [phone]  Name: [name] MRN: [id number] Date of Birth: [birth year]

## 2019-06-26 ENCOUNTER — Ambulatory Visit: Payer: Medicare Other | Admitting: Physical Therapy

## 2019-06-26 ENCOUNTER — Other Ambulatory Visit: Payer: Self-pay

## 2019-06-26 ENCOUNTER — Encounter: Payer: Self-pay | Admitting: Physical Therapy

## 2019-06-26 DIAGNOSIS — M545 Low back pain, unspecified: Secondary | ICD-10-CM

## 2019-06-26 DIAGNOSIS — R262 Difficulty in walking, not elsewhere classified: Secondary | ICD-10-CM

## 2019-06-26 DIAGNOSIS — R2681 Unsteadiness on feet: Secondary | ICD-10-CM

## 2019-06-26 DIAGNOSIS — M6281 Muscle weakness (generalized): Secondary | ICD-10-CM

## 2019-06-26 NOTE — Therapy (Signed)
[facility] [address] [place], [place], [address] Phone: [phone]   Fax:  [phone]  Physical Therapy Treatment  Patient Details  Name: [name] MRN: [id number] Date of Birth: 1935-06-13 Referring Provider (PT): Dr. Newman Pies   Encounter Date: 06/26/2019  PT End of Session - 06/26/19 1224    Visit Number  7    Date for PT Re-Evaluation  07/31/19    Authorization Type  Medicare 10th visit prog note:  KX at visit 15    PT Start Time  1145    PT Stop Time  1224    PT Time Calculation (min)  39 min    Activity Tolerance  Patient limited by fatigue;Other (comment)       Past Medical History:  Diagnosis Date  . Allergic rhinitis, cause unspecified   . Arthritis   . COPD (chronic obstructive pulmonary disease) ([place])   . Dyspnea   . Dysrhythmia    "1 episode of tachycardia in 1980's, been on it ever since"  . Emphysema of lung ([place])   . Hypertension   . Left inguinal hernia 02/07/2019  . Neuromuscular disorder ([place])   . Neuropathy    feet  . Pneumonia    History of, last Oct 2019  . Pulmonary nodule     Past Surgical History:  Procedure Laterality Date  . APPENDECTOMY    . CARDIAC CATHETERIZATION  2003   performed at [facility], Dr. Fransico Him  . CATARACT EXTRACTION, BILATERAL    . EYE SURGERY    . INGUINAL HERNIA REPAIR Left 02/07/2019   Procedure: OPEN REPAIR LEFT INGUINAL HERNIA WITH MESH;  Surgeon: Fanny Skates, MD;  Location: [facility];  Service: General;  Laterality: Left;  SPINAL AND TAP BLOCK ANESTHESIA  . ROTATOR CUFF REPAIR    . TONSILLECTOMY    . VASECTOMY      There were no vitals filed for this visit.  Subjective Assessment - 06/26/19 1226    Subjective  I walk in the house without the walker, just hold on to furniture.  I could do a lot better if it wasn't for this mask.    Pertinent History  wife can't help me, she has her own problems;  O2 full time;  RW full time;  has a  home stair lift;  had pulmonary rehab 1 year ago;  he would like to get stronger to do pulmonary rehab after PT;  wearing brace but pt states the doctor said it was up to me whether to wear it or not    Currently in Pain?  No/denies    Pain Score  0-No pain    Pain Location  Back         [facility] PT Assessment - 06/26/19 0001      Standardized Balance Assessment   Five times sit to stand comments   20.17   hands on thighs     Berg Balance Test   Sit to Stand  Able to stand  independently using hands    Standing Unsupported  Able to stand safely 2 minutes    Sitting with Back Unsupported but Feet Supported on Floor or Stool  Able to sit safely and securely 2 minutes    Stand to Sit  Sits safely with minimal use of hands    Transfers  Able to transfer safely, minor use of hands    Standing Unsupported with Eyes Closed  Able to stand 10 seconds safely  Standing Unsupported with Feet Together  Able to place feet together independently and stand for 1 minute with supervision    From Standing, Reach Forward with Outstretched Arm  Can reach forward >12 cm safely (5")    From Standing Position, Pick up Object from [place] to pick up shoe safely and easily    From Standing Position, Turn to Look Behind Over each Shoulder  Looks behind one side only/other side shows less weight shift    Turn 360 Degrees  Able to turn 360 degrees safely but slowly    Standing Unsupported, Alternately Place Feet on Step/Stool  Able to stand independently and complete 8 steps >20 seconds    Standing Unsupported, One Foot in Front  Able to take small step independently and hold 30 seconds    Standing on One Leg  Able to lift leg independently and hold 5-10 seconds    Total Score  46      Timed Up and Go Test   Normal TUG (seconds)  18.48                   [facility] Adult PT Treatment/Exercise - 06/26/19 0001      Therapeutic Activites    ADL's  walking, standing, curbs/steps, getting in/out of the  car      Neuro Re-ed    Neuro Re-ed Details   narrow base of support, reaching, single leg support       Lumbar Exercises: Standing   Heel Raises  10 reps      Knee/Hip Exercises: Standing   Other Standing Knee Exercises  side stepping 2 laps    Other Standing Knee Exercises  forward walk while carrying O2 tank on shoulder 4 laps                PT Short Term Goals - 06/26/19 1214      PT SHORT TERM GOAL #1   Title  independent with initial HEP    Status  Achieved      PT SHORT TERM GOAL #2   Title  The patient will be able to ambulate 240 feet with pain level 4/10    Time  4    Period  Weeks    Status  On-going      PT SHORT TERM GOAL #3   Title  Timed up and Go speed  improved to 17.5 sec or less    Time  4    Period  Weeks    Status  On-going      PT SHORT TERM GOAL #4   Title  BERG test improved from 42/56 to 45/56    Status  Achieved        PT Long Term Goals - 06/05/19 2018      PT LONG TERM GOAL #1   Title  be independent in advanced HEP    Time  8    Period  Weeks    Status  New    Target Date  07/31/19      PT LONG TERM GOAL #2   Title  BERG balance test improved to 47/56 indicating improved gait safety and decreased risk of falls    Time  8    Period  Weeks    Status  New      PT LONG TERM GOAL #3   Title  The patient will be able to walk for 5 minutes with pain level 4/10 or less  Time  8    Period  Weeks    Status  New      PT LONG TERM GOAL #4   Title  Timed up and go speed improved to 16.5 sec or less    Time  8    Period  Weeks    Status  New      PT LONG TERM GOAL #5   Title  Activities Based Specific Scale (ABC) will indicate decreased fearfulness of falling from initial assessment    Time  8    Period  Weeks    Status  New            Plan - 06/26/19 1225    Clinical Impression Statement  The patient has made significant improvements in multiple re-tests today with 4 point improvement in BERG balance score, 1  second improvement with Timed up and Go and 14 sec improvement in 5x sit to stand.   Primary limiting factor is shortness of breath.  With patient increasing his O2 level, his saturation level is at 93% or higher with spot checks.  Therapist providing close supervision and at times contact guard assist secondary to fall risk.  Discussed progression to gait in the clinic with his QC.  Patient to bring in his QC next visit.  Partial STGs met.    Comorbidities  COPD, O2 dependent, CAD, HTN, chronic LBP    Examination-Activity Limitations  Bend;Carry;Stairs;Lift;Stand;Locomotion Level;Hygiene/Grooming;Dressing    Rehab Potential  Good    PT Frequency  2x / week    PT Duration  8 weeks    PT Treatment/Interventions  ADLs/Self Care Home Management;Gait training;Functional mobility training;Therapeutic activities;Therapeutic exercise;Balance training;Neuromuscular re-education;Manual techniques;Patient/family education;Taping;Dry needling    PT Next Visit Plan  try gait with his QC;   Nu-Step;  short bouts of seated and standing ex with monitoring of O2 sats; walking at counter with weight in one hand or carrying O2 with single arm support;  balance ex    PT Home Exercise Plan  Access Code: [email or web address]       Patient will benefit from skilled therapeutic intervention in order to improve the following deficits and impairments:  Difficulty walking, Cardiopulmonary status limiting activity, Decreased endurance, Decreased activity tolerance, Impaired perceived functional ability, Pain, Decreased balance, Decreased strength  Visit Diagnosis: Acute midline low back pain without sciatica  Muscle weakness (generalized)  Difficulty in walking, not elsewhere classified  Unsteadiness on feet     Problem List Patient Active Problem List   Diagnosis Date Noted  . Left inguinal hernia 02/07/2019  . Left leg pain 05/18/2018  . Pneumonia 05/18/2018  . Chronic respiratory failure with hypoxia ([place]) 01/12/2015   . COPD with acute exacerbation ([place]) 01/21/2013  . Hyperlipidemia 12/01/2011  . Hypertension 12/01/2011  . CAD (coronary artery disease) 05/23/2011  . Lung nodule 09/16/2008  . ALLERGIC RHINITIS 07/24/2007  . COPD mixed type ([place]) 07/24/2007  . BURSITIS 07/24/2007   Ruben Im, PT 06/26/19 3:28 PM Phone: [phone] Fax: [phone] Alvera Singh 06/26/2019, 3:28 PM  [facility] [address], [place], [place], [address] Phone: [phone]   Fax:  [phone]  Name: [name] MRN: [id number] Date of Birth: 11-09-1934

## 2019-06-28 ENCOUNTER — Other Ambulatory Visit: Payer: Self-pay

## 2019-06-28 ENCOUNTER — Ambulatory Visit: Payer: Medicare Other | Admitting: Physical Therapy

## 2019-06-28 ENCOUNTER — Encounter: Payer: Self-pay | Admitting: Physical Therapy

## 2019-06-28 DIAGNOSIS — M6281 Muscle weakness (generalized): Secondary | ICD-10-CM

## 2019-06-28 DIAGNOSIS — R262 Difficulty in walking, not elsewhere classified: Secondary | ICD-10-CM

## 2019-06-28 DIAGNOSIS — M545 Low back pain, unspecified: Secondary | ICD-10-CM

## 2019-06-28 DIAGNOSIS — R2681 Unsteadiness on feet: Secondary | ICD-10-CM

## 2019-06-28 NOTE — Therapy (Signed)
[facility] [address], [place], [place], [address] Phone: [phone]   Fax:  [phone]  Physical Therapy Treatment  Patient Details  Name: [name] MRN: [id number] Date of Birth: [birth year] Referring Provider (PT): Dr. Newman Pies   Encounter Date: 06/28/2019  PT End of Session - 06/28/19 1832    Visit Number  8    Date for PT Re-Evaluation  07/31/19    Authorization Type  Medicare 10th visit prog note:  KX at visit 15    PT Start Time  1142    PT Stop Time  1225    PT Time Calculation (min)  43 min    Activity Tolerance  Patient limited by fatigue;Other (comment)       Past Medical History:  Diagnosis Date  . Allergic rhinitis, cause unspecified   . Arthritis   . COPD (chronic obstructive pulmonary disease) ([place])   . Dyspnea   . Dysrhythmia    "1 episode of tachycardia in 1980's, been on it ever since"  . Emphysema of lung ([place])   . Hypertension   . Left inguinal hernia 02/07/2019  . Neuromuscular disorder ([place])   . Neuropathy    feet  . Pneumonia    History of, last Oct 2019  . Pulmonary nodule     Past Surgical History:  Procedure Laterality Date  . APPENDECTOMY    . CARDIAC CATHETERIZATION  2003   performed at [facility], Dr. Fransico Him  . CATARACT EXTRACTION, BILATERAL    . EYE SURGERY    . INGUINAL HERNIA REPAIR Left 02/07/2019   Procedure: OPEN REPAIR LEFT INGUINAL HERNIA WITH MESH;  Surgeon: Fanny Skates, MD;  Location: [facility];  Service: General;  Laterality: Left;  SPINAL AND TAP BLOCK ANESTHESIA  . ROTATOR CUFF REPAIR    . TONSILLECTOMY    . VASECTOMY      There were no vitals filed for this visit.  Subjective Assessment - 06/28/19 1147    Subjective  This high barometric change makes it harder to breathe.  I almost didn't come today.  Sometimes I feel soreness in my back after PT especially that one with the cane behind my back (hip hinge).    Pertinent History  wife  can't help me, she has her own problems;  O2 full time;  RW full time;  has a home stair lift;  had pulmonary rehab 1 year ago;  he would like to get stronger to do pulmonary rehab after PT;  wearing brace but pt states the doctor said it was up to me whether to wear it or not    Patient Stated Goals  I want to be done with the walker.  be comfortable walking and deceased fear of falling    Currently in Pain?  Yes    Pain Score  3     Pain Location  Back                       [facility] Adult PT Treatment/Exercise - 06/28/19 0001      Ambulation/Gait   Gait Comments  Gait with cane 4x 50 feet; trial of cane in right/left hand and carrying O2 tank with shoulder strap       Therapeutic Activites    ADL's  walking, standing, curbs/steps, getting in/out of the car      Lumbar Exercises: Aerobic   UBE (Upper Arm Bike)  2x 1 minute with rest break  in between      Lumbar Exercises: Seated   Other Seated Lumbar Exercises  green band rows 20x    Other Seated Lumbar Exercises  green band extensions 10x       Knee/Hip Exercises: Seated   Long Arc Quad  Right;Left;10 reps    Long Arc Quad Limitations  red band     Clamshell with TheraBand  Blue   20x    Knee/Hip Flexion  no resist 5x right/left     Other Seated Knee/Hip Exercises  attempted to do red band hip flexion but discontinued secondary to back pain                PT Short Term Goals - 06/26/19 1214      PT SHORT TERM GOAL #1   Title  independent with initial HEP    Status  Achieved      PT SHORT TERM GOAL #2   Title  The patient will be able to ambulate 240 feet with pain level 4/10    Time  4    Period  Weeks    Status  On-going      PT SHORT TERM GOAL #3   Title  Timed up and Go speed  improved to 17.5 sec or less    Time  4    Period  Weeks    Status  On-going      PT SHORT TERM GOAL #4   Title  BERG test improved from 42/56 to 45/56    Status  Achieved        PT Long Term Goals - 06/05/19  2018      PT LONG TERM GOAL #1   Title  be independent in advanced HEP    Time  8    Period  Weeks    Status  New    Target Date  07/31/19      PT LONG TERM GOAL #2   Title  BERG balance test improved to 47/56 indicating improved gait safety and decreased risk of falls    Time  8    Period  Weeks    Status  New      PT LONG TERM GOAL #3   Title  The patient will be able to walk for 5 minutes with pain level 4/10 or less    Time  8    Period  Weeks    Status  New      PT LONG TERM GOAL #4   Title  Timed up and go speed improved to 16.5 sec or less    Time  8    Period  Weeks    Status  New      PT LONG TERM GOAL #5   Title  Activities Based Specific Scale (ABC) will indicate decreased fearfulness of falling from initial assessment    Time  8    Period  Weeks    Status  New            Plan - 06/28/19 1832    Clinical Impression Statement  The patient is able to ambulate very short distances with cane in right hand but has difficulty maintaining balance when he carries his O2 tank on either shoulder and requires min assist from therapist to stabilize.  He also has right sided back pain when he carries the O2 tank on left shoulder. He continues to be very limited in exercise tolerance secondary to shortness of breath.  He  has right LBP with attempted resisted hip flexion.  Therapist careful to avoid right lower leg wound (covered by bandage) with exercise.  He will be going tomorrow for wound treatment.  Therapist monitoring response to all treatment and modifying secondary to respiratory status and back pain.    Comorbidities  COPD, O2 dependent, CAD, HTN, chronic LBP    Examination-Activity Limitations  Bend;Carry;Stairs;Lift;Stand;Locomotion Level;Hygiene/Grooming;Dressing    Examination-Participation Restrictions  Community Activity;Driving;Other    Rehab Potential  Good    PT Frequency  2x / week    PT Duration  8 weeks    PT Treatment/Interventions  ADLs/Self Care  Home Management;Gait training;Functional mobility training;Therapeutic activities;Therapeutic exercise;Balance training;Neuromuscular re-education;Manual techniques;Patient/family education;Taping;Dry needling    PT Next Visit Plan  patient needs assistance getting walker, cane and O2 getting in/out of the car;  gait with  QC;   Nu-Step 1 minute increments with rest breaks;  short bouts of seated and standing ex with monitoring of O2 sats; balance ex    PT Home Exercise Plan  Access Code: [email or web address]       Patient will benefit from skilled therapeutic intervention in order to improve the following deficits and impairments:  Difficulty walking, Cardiopulmonary status limiting activity, Decreased endurance, Decreased activity tolerance, Impaired perceived functional ability, Pain, Decreased balance, Decreased strength  Visit Diagnosis: Acute midline low back pain without sciatica  Muscle weakness (generalized)  Difficulty in walking, not elsewhere classified  Unsteadiness on feet     Problem List Patient Active Problem List   Diagnosis Date Noted  . Left inguinal hernia 02/07/2019  . Left leg pain 05/18/2018  . Pneumonia 05/18/2018  . Chronic respiratory failure with hypoxia ([place]) 01/12/2015  . COPD with acute exacerbation ([place]) 01/21/2013  . Hyperlipidemia 12/01/2011  . Hypertension 12/01/2011  . CAD (coronary artery disease) 05/23/2011  . Lung nodule 09/16/2008  . ALLERGIC RHINITIS 07/24/2007  . COPD mixed type ([place]) 07/24/2007  . BURSITIS 07/24/2007   Ruben Im, PT 06/28/19 6:47 PM Phone: [phone] Fax: [phone] Alvera Singh 06/28/2019, 6:47 PM  [facility] [address] [place], [place], [address] Phone: [phone]   Fax:  [phone]  Name: CHRISTY ARQUILLA MRN: [id number] Date of Birth: [birth year]

## 2019-06-29 ENCOUNTER — Encounter (HOSPITAL_BASED_OUTPATIENT_CLINIC_OR_DEPARTMENT_OTHER): Payer: Medicare Other | Attending: Internal Medicine | Admitting: Internal Medicine

## 2019-06-29 ENCOUNTER — Other Ambulatory Visit: Payer: Self-pay

## 2019-06-29 DIAGNOSIS — I10 Essential (primary) hypertension: Secondary | ICD-10-CM | POA: Insufficient documentation

## 2019-06-29 DIAGNOSIS — L97812 Non-pressure chronic ulcer of other part of right lower leg with fat layer exposed: Secondary | ICD-10-CM | POA: Insufficient documentation

## 2019-06-29 DIAGNOSIS — S8011XA Contusion of right lower leg, initial encounter: Secondary | ICD-10-CM | POA: Insufficient documentation

## 2019-06-29 DIAGNOSIS — I87333 Chronic venous hypertension (idiopathic) with ulcer and inflammation of bilateral lower extremity: Secondary | ICD-10-CM | POA: Diagnosis not present

## 2019-06-29 DIAGNOSIS — I890 Lymphedema, not elsewhere classified: Secondary | ICD-10-CM | POA: Diagnosis not present

## 2019-06-29 DIAGNOSIS — R2689 Other abnormalities of gait and mobility: Secondary | ICD-10-CM | POA: Diagnosis not present

## 2019-06-29 DIAGNOSIS — I482 Chronic atrial fibrillation, unspecified: Secondary | ICD-10-CM | POA: Diagnosis not present

## 2019-06-29 DIAGNOSIS — J449 Chronic obstructive pulmonary disease, unspecified: Secondary | ICD-10-CM | POA: Insufficient documentation

## 2019-06-29 DIAGNOSIS — Z9981 Dependence on supplemental oxygen: Secondary | ICD-10-CM | POA: Insufficient documentation

## 2019-06-29 DIAGNOSIS — W208XXA Other cause of strike by thrown, projected or falling object, initial encounter: Secondary | ICD-10-CM | POA: Insufficient documentation

## 2019-06-29 DIAGNOSIS — M549 Dorsalgia, unspecified: Secondary | ICD-10-CM | POA: Insufficient documentation

## 2019-06-29 DIAGNOSIS — S81801A Unspecified open wound, right lower leg, initial encounter: Secondary | ICD-10-CM | POA: Diagnosis not present

## 2019-06-29 DIAGNOSIS — I872 Venous insufficiency (chronic) (peripheral): Secondary | ICD-10-CM | POA: Insufficient documentation

## 2019-06-29 DIAGNOSIS — Z6822 Body mass index (BMI) 22.0-22.9, adult: Secondary | ICD-10-CM | POA: Insufficient documentation

## 2019-07-03 ENCOUNTER — Ambulatory Visit: Payer: Medicare Other | Admitting: Physical Therapy

## 2019-07-03 ENCOUNTER — Other Ambulatory Visit: Payer: Self-pay

## 2019-07-03 ENCOUNTER — Encounter: Payer: Self-pay | Admitting: Physical Therapy

## 2019-07-03 DIAGNOSIS — R2681 Unsteadiness on feet: Secondary | ICD-10-CM | POA: Diagnosis not present

## 2019-07-03 DIAGNOSIS — R262 Difficulty in walking, not elsewhere classified: Secondary | ICD-10-CM | POA: Diagnosis not present

## 2019-07-03 DIAGNOSIS — M545 Low back pain, unspecified: Secondary | ICD-10-CM

## 2019-07-03 DIAGNOSIS — M6281 Muscle weakness (generalized): Secondary | ICD-10-CM | POA: Diagnosis not present

## 2019-07-03 NOTE — Therapy (Signed)
[facility] [address] [place], [place], [address] Phone: [phone]   Fax:  [phone]  Physical Therapy Treatment  Patient Details  Name: [name] MRN: [id number] Date of Birth: 09/21/1934 Referring Provider (PT): Dr. Newman Pies   Encounter Date: 07/03/2019  PT End of Session - 07/03/19 1252    Visit Number  9    Date for PT Re-Evaluation  07/31/19    Authorization Type  Medicare 10th visit prog note:  KX at visit 15    PT Start Time  [id number]    PT Stop Time  1315    PT Time Calculation (min)  40 min    Activity Tolerance  Patient limited by fatigue;Other (comment)       Past Medical History:  Diagnosis Date  . Allergic rhinitis, cause unspecified   . Arthritis   . COPD (chronic obstructive pulmonary disease) ([place])   . Dyspnea   . Dysrhythmia    "1 episode of tachycardia in 1980's, been on it ever since"  . Emphysema of lung ([place])   . Hypertension   . Left inguinal hernia 02/07/2019  . Neuromuscular disorder ([place])   . Neuropathy    feet  . Pneumonia    History of, last Oct 2019  . Pulmonary nodule     Past Surgical History:  Procedure Laterality Date  . APPENDECTOMY    . CARDIAC CATHETERIZATION  2003   performed at [facility], Dr. Fransico Him  . CATARACT EXTRACTION, BILATERAL    . EYE SURGERY    . INGUINAL HERNIA REPAIR Left 02/07/2019   Procedure: OPEN REPAIR LEFT INGUINAL HERNIA WITH MESH;  Surgeon: Fanny Skates, MD;  Location: [facility];  Service: General;  Laterality: Left;  SPINAL AND TAP BLOCK ANESTHESIA  . ROTATOR CUFF REPAIR    . TONSILLECTOMY    . VASECTOMY      There were no vitals filed for this visit.  Subjective Assessment - 07/03/19 1233    Subjective  Went to the wound center for right lower leg  and will follow up on Friday.  They said it was OK to do moderate ex.  My back is about the same.  Hurts with standing and then sometimes when I get out of bed.  Reviewed log roll  technique which he learned from OT.  Forgot cane today.    Pertinent History  wife can't help me, she has her own problems;  O2 full time;  RW full time;  has a home stair lift;  had pulmonary rehab 1 year ago;  he would like to get stronger to do pulmonary rehab after PT;  wearing brace but pt states the doctor said it was up to me whether to wear it or not    Patient Stated Goals  I want to be done with the walker.  be comfortable walking and deceased fear of falling    Currently in Pain?  Yes    Pain Location  Back                       [facility] Adult PT Treatment/Exercise - 07/03/19 0001      Ambulation/Gait   Gait Comments  gait with SPC 40 feet x3    seated rest breaks b/w each lap secondary to short of breath     Therapeutic Activites    ADL's  walking, standing, curbs/steps, getting in/out of the car      Lumbar  Exercises: Aerobic   Nustep  1 minute x4 while discussing progress       Knee/Hip Exercises: Standing   Other Standing Knee Exercises  forward walk while carrying O2 tank on shoulder 4 laps       Knee/Hip Exercises: Seated   Long Arc Quad  Right;Left;10 reps    Long Arc Quad Limitations  no resistance  b/c of bandaging     Knee/Hip Flexion  no resist 5x right/left     Other Seated Knee/Hip Exercises  chair sit ups 10x     Other Seated Knee/Hip Exercises  green band shoulder extensions and rows 10x                PT Short Term Goals - 06/26/19 1214      PT SHORT TERM GOAL #1   Title  independent with initial HEP    Status  Achieved      PT SHORT TERM GOAL #2   Title  The patient will be able to ambulate 240 feet with pain level 4/10    Time  4    Period  Weeks    Status  On-going      PT SHORT TERM GOAL #3   Title  Timed up and Go speed  improved to 17.5 sec or less    Time  4    Period  Weeks    Status  On-going      PT SHORT TERM GOAL #4   Title  BERG test improved from 42/56 to 45/56    Status  Achieved        PT Long Term  Goals - 06/05/19 2018      PT LONG TERM GOAL #1   Title  be independent in advanced HEP    Time  8    Period  Weeks    Status  New    Target Date  07/31/19      PT LONG TERM GOAL #2   Title  BERG balance test improved to 47/56 indicating improved gait safety and decreased risk of falls    Time  8    Period  Weeks    Status  New      PT LONG TERM GOAL #3   Title  The patient will be able to walk for 5 minutes with pain level 4/10 or less    Time  8    Period  Weeks    Status  New      PT LONG TERM GOAL #4   Title  Timed up and go speed improved to 16.5 sec or less    Time  8    Period  Weeks    Status  New      PT LONG TERM GOAL #5   Title  Activities Based Specific Scale (ABC) will indicate decreased fearfulness of falling from initial assessment    Time  8    Period  Weeks    Status  New            Plan - 07/03/19 1310    Clinical Impression Statement  Low capacity for exercise and ambulation secondary to respiratory issues.  He is able to ambulate short distances with good stride length, upright posture, and normal gait speed.  No episodes of loss of balance however therapist providing close supervision for safety.  He has difficulty carrying his O2 tank on his shoulder with strap and single arm support.  Has back pain with  seated right hip flexion only.  Therapist closely monitoring respiratory status and back pain and adjusting treatment accordingly.    Comorbidities  COPD, O2 dependent, CAD, HTN, chronic LBP    Examination-Activity Limitations  Bend;Carry;Stairs;Lift;Stand;Locomotion Level;Hygiene/Grooming;Dressing    PT Frequency  2x / week    PT Duration  8 weeks    PT Treatment/Interventions  ADLs/Self Care Home Management;Gait training;Functional mobility training;Therapeutic activities;Therapeutic exercise;Balance training;Neuromuscular re-education;Manual techniques;Patient/family education;Taping;Dry needling    PT Next Visit Plan  10th visit progress note  next visit;  patient needs assistance getting walker, cane and O2 getting in/out of the car;  gait with  QC;   Nu-Step 1 minute increments with rest breaks;  short bouts of seated and standing ex with monitoring of O2 sats; balance ex    PT Home Exercise Plan  Access Code: [email or web address]       Patient will benefit from skilled therapeutic intervention in order to improve the following deficits and impairments:  Difficulty walking, Cardiopulmonary status limiting activity, Decreased endurance, Decreased activity tolerance, Impaired perceived functional ability, Pain, Decreased balance, Decreased strength  Visit Diagnosis: Acute midline low back pain without sciatica  Muscle weakness (generalized)  Difficulty in walking, not elsewhere classified  Unsteadiness on feet     Problem List Patient Active Problem List   Diagnosis Date Noted  . Left inguinal hernia 02/07/2019  . Left leg pain 05/18/2018  . Pneumonia 05/18/2018  . Chronic respiratory failure with hypoxia ([place]) 01/12/2015  . COPD with acute exacerbation ([place]) 01/21/2013  . Hyperlipidemia 12/01/2011  . Hypertension 12/01/2011  . CAD (coronary artery disease) 05/23/2011  . Lung nodule 09/16/2008  . ALLERGIC RHINITIS 07/24/2007  . COPD mixed type ([place]) 07/24/2007  . BURSITIS 07/24/2007   Ruben Im, PT 07/03/19 5:32 PM Phone: [phone] Fax: [phone] Alvera Singh 07/03/2019, 5:32 PM  [facility] [address] [place], [place], [address] Phone: [phone]   Fax:  [phone]  Name: [name] MRN: [id number] Date of Birth: [birth year]

## 2019-07-04 ENCOUNTER — Telehealth: Payer: Self-pay | Admitting: *Deleted

## 2019-07-04 NOTE — Telephone Encounter (Signed)
Patient called - requested results from lab tests 06/11/2019. Per Dr. Irene Limbo, inform patient: Labs reviewed anemia minimal work-up is unrevealing. No evidence of monoclonal paraproteinemia vitamin or iron deficiency hemoglobin is 11.8. No further hematologic interventions recommended continue follow-up with primary care physician for routine cares. Patient contacted with this information - he verbalized understanding. Encouraged to f/u with Dr. Rex Kras.

## 2019-07-05 ENCOUNTER — Encounter: Payer: Self-pay | Admitting: Physical Therapy

## 2019-07-05 ENCOUNTER — Other Ambulatory Visit: Payer: Self-pay

## 2019-07-05 ENCOUNTER — Ambulatory Visit: Payer: Medicare Other | Admitting: Physical Therapy

## 2019-07-05 DIAGNOSIS — M545 Low back pain, unspecified: Secondary | ICD-10-CM

## 2019-07-05 DIAGNOSIS — R2681 Unsteadiness on feet: Secondary | ICD-10-CM

## 2019-07-05 DIAGNOSIS — R262 Difficulty in walking, not elsewhere classified: Secondary | ICD-10-CM

## 2019-07-05 DIAGNOSIS — M6281 Muscle weakness (generalized): Secondary | ICD-10-CM

## 2019-07-05 NOTE — Therapy (Signed)
[facility] [address] [place], [place], [address] Phone: [phone]   Fax:  [phone]  Physical Therapy Treatment  Patient Details  Name: [name] MRN: [id number] Date of Birth: Sep 12, 1934 Referring Provider (PT): Dr. Newman Pies  Progress Note Reporting Period 06/05/19 to 07/05/19  See note below for Objective Data and Assessment of Progress/Goals.      Encounter Date: 07/05/2019  PT End of Session - 07/05/19 1131    Visit Number  10    Date for PT Re-Evaluation  07/31/19    Authorization Type  Medicare 10th visit prog note:  KX at visit 15    PT Start Time  1057    PT Stop Time  1145    PT Time Calculation (min)  48 min    Activity Tolerance  Patient limited by fatigue;Other (comment)       Past Medical History:  Diagnosis Date  . Allergic rhinitis, cause unspecified   . Arthritis   . COPD (chronic obstructive pulmonary disease) ([place])   . Dyspnea   . Dysrhythmia    "1 episode of tachycardia in 1980's, been on it ever since"  . Emphysema of lung ([place])   . Hypertension   . Left inguinal hernia 02/07/2019  . Neuromuscular disorder ([place])   . Neuropathy    feet  . Pneumonia    History of, last Oct 2019  . Pulmonary nodule     Past Surgical History:  Procedure Laterality Date  . APPENDECTOMY    . CARDIAC CATHETERIZATION  2003   performed at [facility], Dr. Fransico Him  . CATARACT EXTRACTION, BILATERAL    . EYE SURGERY    . INGUINAL HERNIA REPAIR Left 02/07/2019   Procedure: OPEN REPAIR LEFT INGUINAL HERNIA WITH MESH;  Surgeon: Fanny Skates, MD;  Location: [place];  Service: General;  Laterality: Left;  SPINAL AND TAP BLOCK ANESTHESIA  . ROTATOR CUFF REPAIR    . TONSILLECTOMY    . VASECTOMY      There were no vitals filed for this visit.  Subjective Assessment - 07/05/19 1101    Subjective  I didn't sleep well last night b/c of right leg.  The bandage seems to bother my peripheral  neuropathy.  Brought cane today.    Pertinent History  wife can't help me, she has her own problems;  O2 full time;  RW full time;  has a home stair lift;  had pulmonary rehab 1 year ago;  he would like to get stronger to do pulmonary rehab after PT;  wearing brace but pt states the doctor said it was up to me whether to wear it or not    Currently in Pain?  Yes    Pain Score  3     Pain Location  Back    Pain Type  Chronic pain         [facility] PT Assessment - 07/05/19 0001      Observation/Other Assessments   Activities of Balance Confidence Scale (ABC Scale)   36.5   initially 20.3     Berg Balance Test   Berg comment:  46/56      Timed Up and Go Test   Normal TUG (seconds)  18.48                   [facility] Adult PT Treatment/Exercise - 07/05/19 0001      Ambulation/Gait   Gait Comments  gait with SPC 100 feet,  then 80 feet, 30 feet     seated rest breaks b/w each lap secondary to short of breath     Therapeutic Activites    ADL's  walking, standing, curbs/steps, getting in/out of the car      Lumbar Exercises: Aerobic   Nustep  1 minute x3 while discussing progress       Lumbar Exercises: Standing   Heel Raises  10 reps    Heel Raises Limitations  toe raises 10x      Knee/Hip Exercises: Standing   Heel Raises  Both;10 reps    Hip Extension  Stengthening;Right;Left;10 reps    Extension Limitations  red band      Knee/Hip Exercises: Seated   Heel Slides Limitations  red band right only 10x    NO LEFT    Sit to Sand  --   3x no UE use               PT Short Term Goals - 06/26/19 1214      PT SHORT TERM GOAL #1   Title  independent with initial HEP    Status  Achieved      PT SHORT TERM GOAL #2   Title  The patient will be able to ambulate 240 feet with pain level 4/10    Time  4    Period  Weeks    Status  On-going      PT SHORT TERM GOAL #3   Title  Timed up and Go speed  improved to 17.5 sec or less    Time  4    Period  Weeks     Status  On-going      PT SHORT TERM GOAL #4   Title  BERG test improved from 42/56 to 45/56    Status  Achieved        PT Long Term Goals - 06/05/19 2018      PT LONG TERM GOAL #1   Title  be independent in advanced HEP    Time  8    Period  Weeks    Status  New    Target Date  07/31/19      PT LONG TERM GOAL #2   Title  BERG balance test improved to 47/56 indicating improved gait safety and decreased risk of falls    Time  8    Period  Weeks    Status  New      PT LONG TERM GOAL #3   Title  The patient will be able to walk for 5 minutes with pain level 4/10 or less    Time  8    Period  Weeks    Status  New      PT LONG TERM GOAL #4   Title  Timed up and go speed improved to 16.5 sec or less    Time  8    Period  Weeks    Status  New      PT LONG TERM GOAL #5   Title  Activities Based Specific Scale (ABC) will indicate decreased fearfulness of falling from initial assessment    Time  8    Period  Weeks    Status  New            Plan - 07/05/19 1356    Clinical Impression Statement  The patient continues to be very limited in exercise and ambulation capacity secondary to COPD further limited with mask wearing.  He  is able, however, to increase ambulation distance with his cane.  He has 2 episodes of mild loss of balance with only min assist from PT to recover.  He has increased low back pain with standing resisted hip extension requiring modification/limitation of reps.  Avoidance of weights/bands on left lower leg secondary to non-healing wound.  Therapist closely monitoring respiratory status throughout treatment session.  Progress will be slowed by numerous comorbities but has made improvements with Timed up and Go gait speed, BERG balance test and Activities Based Confidence Scale.    Personal Factors and Comorbidities  Age;Past/Current Experience;Comorbidity 1;Comorbidity 2;Comorbidity 3+;Fitness;Time since onset of injury/illness/exacerbation    Comorbidities   COPD, O2 dependent, CAD, HTN, chronic LBP    Examination-Activity Limitations  Bend;Carry;Stairs;Lift;Stand;Locomotion Level;Hygiene/Grooming;Dressing    Examination-Participation Restrictions  Community Activity;Driving;Other    Rehab Potential  Good    PT Frequency  2x / week    PT Duration  8 weeks    PT Treatment/Interventions  ADLs/Self Care Home Management;Gait training;Functional mobility training;Therapeutic activities;Therapeutic exercise;Balance training;Neuromuscular re-education;Manual techniques;Patient/family education;Taping;Dry needling    PT Next Visit Plan  check 5x sit to stand test;  patient needs assistance getting walker, cane and O2 getting in/out of the car;  gait with  QC;   Nu-Step 1 minute increments with rest breaks;  short bouts of seated and standing ex with monitoring of O2 sats; balance ex    PT Home Exercise Plan  Access Code: [email or web address]       Patient will benefit from skilled therapeutic intervention in order to improve the following deficits and impairments:  Difficulty walking, Cardiopulmonary status limiting activity, Decreased endurance, Decreased activity tolerance, Impaired perceived functional ability, Pain, Decreased balance, Decreased strength  Visit Diagnosis: Acute midline low back pain without sciatica  Muscle weakness (generalized)  Difficulty in walking, not elsewhere classified  Unsteadiness on feet     Problem List Patient Active Problem List   Diagnosis Date Noted  . Left inguinal hernia 02/07/2019  . Left leg pain 05/18/2018  . Pneumonia 05/18/2018  . Chronic respiratory failure with hypoxia ([place]) 01/12/2015  . COPD with acute exacerbation ([place]) 01/21/2013  . Hyperlipidemia 12/01/2011  . Hypertension 12/01/2011  . CAD (coronary artery disease) 05/23/2011  . Lung nodule 09/16/2008  . ALLERGIC RHINITIS 07/24/2007  . COPD mixed type ([place]) 07/24/2007  . BURSITIS 07/24/2007   Ruben Im, PT 07/05/19 5:40 PM Phone:  [phone] Fax: [phone] Alvera Singh 07/05/2019, 5:39 PM  [facility] [address] [place], [place], [address] Phone: [phone]   Fax:  [phone]  Name: EDUARDO MENGE MRN: [id number] Date of Birth: 09/07/1934

## 2019-07-06 ENCOUNTER — Encounter (HOSPITAL_BASED_OUTPATIENT_CLINIC_OR_DEPARTMENT_OTHER): Payer: Medicare Other | Admitting: Internal Medicine

## 2019-07-06 DIAGNOSIS — J449 Chronic obstructive pulmonary disease, unspecified: Secondary | ICD-10-CM | POA: Diagnosis not present

## 2019-07-06 DIAGNOSIS — S8011XA Contusion of right lower leg, initial encounter: Secondary | ICD-10-CM | POA: Diagnosis not present

## 2019-07-06 DIAGNOSIS — I87393 Chronic venous hypertension (idiopathic) with other complications of bilateral lower extremity: Secondary | ICD-10-CM | POA: Diagnosis not present

## 2019-07-06 DIAGNOSIS — I87333 Chronic venous hypertension (idiopathic) with ulcer and inflammation of bilateral lower extremity: Secondary | ICD-10-CM | POA: Diagnosis not present

## 2019-07-06 DIAGNOSIS — S80811A Abrasion, right lower leg, initial encounter: Secondary | ICD-10-CM | POA: Diagnosis not present

## 2019-07-06 DIAGNOSIS — L97812 Non-pressure chronic ulcer of other part of right lower leg with fat layer exposed: Secondary | ICD-10-CM | POA: Diagnosis not present

## 2019-07-06 DIAGNOSIS — Z6822 Body mass index (BMI) 22.0-22.9, adult: Secondary | ICD-10-CM | POA: Diagnosis not present

## 2019-07-06 DIAGNOSIS — I10 Essential (primary) hypertension: Secondary | ICD-10-CM | POA: Diagnosis not present

## 2019-07-09 NOTE — Progress Notes (Signed)
[name], [name] ([id number]) Visit Report for 07/06/2019 HPI Details Patient Name: Date of Service: [name], [name] 07/06/2019 10:30 AM Medical Record [id number] Patient Account Number: [id number] Date of Birth/Sex: Treating RN: [birth year] (84 y.o. [name] Primary Care Provider: Nicholes [name] Other Clinician: Referring Provider: Treating Provider/Extender:, [name], [name] in Treatment: 1 History of Present Illness HPI Description: ADMISSION 06/29/2019 This is an 84 year old man who was referred here for review of the wound on the right medial lower leg. This started as trauma after he dropped his oxygen tank on his leg. By his description he had an intact hematoma that lasted for about 2 weeks and then it spontaneously opened on its own. He was seen by his primary doctor and put on antibiotic therapy on 10/21 with cephalexin 500 twice a day. He completed this recently. The patient has been placing Vaseline gauze over the wound area. He is not in any compression. Past medical history; COPD with chronic oxygen therapy, back pain, gait instability, status post left inguinal hernia repair on 02/07/2019, chronic venous insufficiency, chronic atrial fibrillation. He is currently going to outpatient physical therapy ABIs in our clinic were 1.08 on the right 11/20 punched out area on the right medial lower leg. We have debrided this last week applied silver alginate under compression. There is no option for home health as he is going out to outpatient physical therapy. He is reluctant to use home health companies for physical therapy that he feels gets better service at an outpatient rehab facility. Looking at his left leg he clearly has changes of chronic venous insufficiency no doubt this was contributing to the swelling on the right leg last week Electronic Signature(s) Signed: 07/06/2019 6:05:38 PM By: [name] Ham MD Entered By: [name] on 07/06/2019 12:42:45 -------------------------------------------------------------------------------- Physical Exam Details Patient Name: Date of Service: [name] 07/06/2019 10:30 AM Medical Record [id number] Patient Account Number: [id number] Date of Birth/Sex: Treating RN: 23-Jan-1935 (84 y.o. [name] Primary Care Provider: Nicholes [name] Other Clinician: Referring Provider: Treating Provider/Extender:, [name], [name] in Treatment: 1 Constitutional Patient is hypertensive.. Pulse regular and within target range for patient.[name] Respirations regular, non-labored and within target range.. Temperature is normal and within the target range for the patient.[name] Appears in no distress. Eyes Conjunctivae clear. No discharge.no icterus. Respiratory O2 no distress. Cardiovascular He has good edema control on the right leg.. Integumentary (Hair, Skin) The patient has hemosiderin changes in the left greater than right leg. No doubt a component of chronic venous hypertension. Psychiatric appears at normal baseline. Notes Wound exam; right lower calf. Improved wound surface after last week's debridement. No additional debridement is necessary. The superior part of the wound is actually the least viable. This was vigorously debrided with Anasept and gauze but no mechanical debridement was necessary. Electronic Signature(s) Signed: 07/06/2019 6:05:38 PM By: [name] Ham MD Entered By: [name] on 07/06/2019 12:48:49 -------------------------------------------------------------------------------- Physician Orders Details Patient Name: Date of Service: [name], [name] 07/06/2019 10:30 AM Medical Record [id number] Patient Account Number: [id number] Date of Birth/Sex: Treating RN: [birth year] (84 y.o. [name] Primary Care Provider: Nicholes [name] Other Clinician: Referring Provider: Treating  Provider/Extender:, [name], [name] in Treatment: 1 Verbal / Phone Orders: No Diagnosis Coding ICD-10 Coding Code Description S80.11XD Contusion of right lower leg, subsequent encounter L97.812 Non-pressure chronic ulcer of other part of right lower leg with fat layer exposed Follow-up Appointments Return Appointment in 2 weeks. -  MD visit Nurse Visit: - next week Dressing Change Frequency Do not change entire dressing for one week. Wound Cleansing May shower with protection. Primary Wound Dressing Wound #1 Right Lower Leg Silver Collagen - moisten with hydrogel Foam - apply to shin for protection Secondary Dressing Wound #1 Right Lower Leg Dry Gauze ABD pad Drawtex - cut to fit inside wound margins Edema Control 4 layer compression - Right Lower Extremity Avoid standing for long periods of time Elevate legs to the level of the heart or above for 30 minutes daily and/or when sitting, a frequency of: Exercise regularly Electronic Signature(s) Signed: 07/06/2019 6:05:38 PM By: [name] Ham MD Signed: 07/09/2019 2:17:56 PM By: Kela Millin Entered By: Kela Millin on 07/06/2019 12:07:38 -------------------------------------------------------------------------------- Problem List Details Patient Name: Date of Service: [name] 07/06/2019 10:30 AM Medical Record [id number] Patient Account Number: [id number] Date of Birth/Sex: Treating RN: 1935/03/14 (84 y.o. [name] Primary Care Provider: Nicholes [name] Other Clinician: Referring Provider: Treating Provider/Extender:, [name], [name] in Treatment: 1 Active Problems ICD-10 Evaluated Encounter Code Description Active Date Today Diagnosis S80.11XD Contusion of right lower leg, subsequent encounter 06/29/2019 No Yes L97.812 Non-pressure chronic ulcer of other part of right lower 06/29/2019 No Yes leg with fat layer exposed I87.333 Chronic  venous hypertension (idiopathic) with ulcer 07/06/2019 No Yes and inflammation of bilateral lower extremity Inactive Problems Resolved Problems Electronic Signature(s) Signed: 07/06/2019 6:05:38 PM By: [name] Ham MD Entered By: [name] on 07/06/2019 12:43:21 -------------------------------------------------------------------------------- Progress Note Details Patient Name: Date of Service: [name] 07/06/2019 10:30 AM Medical Record [id number] Patient Account Number: [id number] Date of Birth/Sex: Treating RN: 1934/09/10 (84 y.o. [name] Primary Care Provider: Nicholes [name] Other Clinician: Referring Provider: Treating Provider/Extender:, [name], [name] in Treatment: 1 Subjective History of Present Illness (HPI) ADMISSION 06/29/2019 This is an 84 year old man who was referred here for review of the wound on the right medial lower leg. This started as trauma after he dropped his oxygen tank on his leg. By his description he had an intact hematoma that lasted for about 2 weeks and then it spontaneously opened on its own. He was seen by his primary doctor and put on antibiotic therapy on 10/21 with cephalexin 500 twice a day. He completed this recently. The patient has been placing Vaseline gauze over the wound area. He is not in any compression. Past medical history; COPD with chronic oxygen therapy, back pain, gait instability, status post left inguinal hernia repair on 02/07/2019, chronic venous insufficiency, chronic atrial fibrillation. He is currently going to outpatient physical therapy ABIs in our clinic were 1.08 on the right 11/20 punched out area on the right medial lower leg. We have debrided this last week applied silver alginate under compression. There is no option for home health as he is going out to outpatient physical therapy. He is reluctant to use home health companies for physical therapy that he feels  gets better service at an outpatient rehab facility. Looking at his left leg he clearly has changes of chronic venous insufficiency no doubt this was contributing to the swelling on the right leg last week Objective Constitutional Patient is hypertensive.. Pulse regular and within target range for patient.[name] Respirations regular, non-labored and within target range.. Temperature is normal and within the target range for the patient.[name] Appears in no distress. Vitals Time Taken: 10:54 AM, Height: 68 in, Weight: 145 lbs, BMI: 22, Temperature: 98.6 F, Pulse: 81 bpm, Respiratory Rate:  18 breaths/min, Blood Pressure: 152/69 mmHg. Eyes Conjunctivae clear. No discharge.no icterus. Respiratory O2 no distress. Cardiovascular He has good edema control on the right leg.[name] Psychiatric appears at normal baseline. General Notes: Wound exam; right lower calf. Improved wound surface after last week's debridement. No additional debridement is necessary. The superior part of the wound is actually the least viable. This was vigorously debrided with Anasept and gauze but no mechanical debridement was necessary. Integumentary (Hair, Skin) The patient has hemosiderin changes in the left greater than right leg. No doubt a component of chronic venous hypertension. Wound #1 status is Open. Original cause of wound was Trauma. The wound is located on the Right Lower Leg. The wound measures 3cm length x 2.9cm width x 0.7cm depth; 6.833cm^2 area and 4.783cm^3 volume. There is Fat Layer (Subcutaneous Tissue) Exposed exposed. There is no tunneling or undermining noted. There is a medium amount of serosanguineous drainage noted. The wound margin is well defined and not attached to the wound base. There is medium (34-66%) pink, pale granulation within the wound bed. There is a medium (34-66%) amount of necrotic tissue within the wound bed including Adherent Slough. Assessment Active Problems ICD-10 Contusion of right  lower leg, subsequent encounter Non-pressure chronic ulcer of other part of right lower leg with fat layer exposed Chronic venous hypertension (idiopathic) with ulcer and inflammation of bilateral lower extremity Procedures Wound #1 Pre-procedure diagnosis of Wound #1 is an Abrasion located on the Right Lower Leg . There was a Four Layer Compression Therapy Procedure by Deon Pilling, RN. Post procedure Diagnosis Wound #1: Same as Pre-Procedure Plan Follow-up Appointments: Return Appointment in 2 weeks. - MD visit Nurse Visit: - next week Dressing Change Frequency: Do not change entire dressing for one week. Wound Cleansing: May shower with protection. Primary Wound Dressing: Wound #1 Right Lower Leg: Silver Collagen - moisten with hydrogel Foam - apply to shin for protection Secondary Dressing: Wound #1 Right Lower Leg: Dry Gauze ABD pad Drawtex - cut to fit inside wound margins Edema Control: 4 layer compression - Right Lower Extremity Avoid standing for long periods of time Elevate legs to the level of the heart or above for 30 minutes daily and/or when sitting, a frequency of: Exercise regularly 1. I change the primary dressing to Prisma hydrogel from silver alginate. See if we can stimulate some granulation. Drawtex packing 2. Run Apligraf through his insurance 3. No current evidence of infection 4. Continue 4-layer compression Electronic Signature(s) Signed: 07/06/2019 6:05:38 PM By: [name] Ham MD Entered By: [name] on 07/06/2019 12:49:41 -------------------------------------------------------------------------------- SuperBill Details Patient Name: Date of Service: ADITYA, SZALKOWSKI 07/06/2019 Medical Record [id number] Patient Account Number: [id number] Date of Birth/Sex: Treating RN: 1935/06/28 (84 y.o. [name] Primary Care Provider: Nicholes [name] Other Clinician: Referring Provider: Treating Provider/Extender:,  [name], [name] in Treatment: 1 Diagnosis Coding ICD-10 Codes Code Description S80.11XD Contusion of right lower leg, subsequent encounter L97.812 Non-pressure chronic ulcer of other part of right lower leg with fat layer exposed I87.333 Chronic venous hypertension (idiopathic) with ulcer and inflammation of bilateral lower extremity Facility Procedures CPT4 Code Description: [id number] (Facility Use Only) [phone] - APPLY MULTLAY COMPRS LWR RT LEG Modifier: 1 Quantity: Physician Procedures CPT4: Description Modifier Quantity Code [id number] - WC PHYS LEVEL 3 - EST PT 1 ICD-10 Diagnosis Description S80.11XD Contusion of right lower leg, subsequent encounter L97.812 Non-pressure chronic ulcer of other part of right lower leg with fat  layer exposed I87.333 Chronic venous  hypertension (idiopathic) with ulcer and inflammation of bilateral lower extremity Electronic Signature(s) Signed: 07/06/2019 6:05:38 PM By: [name] Ham MD Entered By: [name] on 07/06/2019 12:50:14

## 2019-07-10 ENCOUNTER — Encounter (HOSPITAL_BASED_OUTPATIENT_CLINIC_OR_DEPARTMENT_OTHER): Payer: Medicare Other

## 2019-07-10 ENCOUNTER — Other Ambulatory Visit: Payer: Self-pay

## 2019-07-10 ENCOUNTER — Encounter: Payer: Medicare Other | Admitting: Physical Therapy

## 2019-07-10 ENCOUNTER — Ambulatory Visit: Payer: Medicare Other | Admitting: Physical Therapy

## 2019-07-10 DIAGNOSIS — I10 Essential (primary) hypertension: Secondary | ICD-10-CM | POA: Diagnosis not present

## 2019-07-10 DIAGNOSIS — S8011XA Contusion of right lower leg, initial encounter: Secondary | ICD-10-CM | POA: Diagnosis not present

## 2019-07-10 DIAGNOSIS — Z6822 Body mass index (BMI) 22.0-22.9, adult: Secondary | ICD-10-CM | POA: Diagnosis not present

## 2019-07-10 DIAGNOSIS — J449 Chronic obstructive pulmonary disease, unspecified: Secondary | ICD-10-CM | POA: Diagnosis not present

## 2019-07-10 DIAGNOSIS — L97812 Non-pressure chronic ulcer of other part of right lower leg with fat layer exposed: Secondary | ICD-10-CM | POA: Diagnosis not present

## 2019-07-10 DIAGNOSIS — I87333 Chronic venous hypertension (idiopathic) with ulcer and inflammation of bilateral lower extremity: Secondary | ICD-10-CM | POA: Diagnosis not present

## 2019-07-10 NOTE — Progress Notes (Signed)
[name], [name] ([id number]) Visit Report for 07/10/2019 Arrival Information Details Patient Name: Date of Service: [name], [name] 07/10/2019 1:30 PM Medical Record [id number] Patient Account Number: [id number] Date of Birth/Sex: Treating RN: September 17, 1934 (84 y.o. Hessie Diener Primary Care Dalary Hollar: Nicholes Stairs Other Clinician: Referring Karstyn Birkey: Treating Tyreck Bell/Extender:Stone III, Rae Halsted in Treatment: 1 Visit Information History Since Last Visit Walker Added or deleted any medications: No Patient Arrived: Any new allergies or adverse reactions: No Arrival Time: 13:35 Had a fall or experienced change in No Accompanied By: self activities of daily living that may affect Transfer Assistance: None risk of falls: Patient Identification Verified: Yes Signs or symptoms of abuse/neglect since last No Secondary Verification Process Completed: Yes visito Patient Requires Transmission-Based No Hospitalized since last visit: No Precautions: Implantable device outside of the clinic excluding No Patient Has Alerts: No cellular tissue based products placed in the center since last visit: Has Dressing in Place as Prescribed: Yes Has Compression in Place as Prescribed: Yes Pain Present Now: Yes Electronic Signature(s) Signed: 07/10/2019 2:36:36 PM By: Deon Pilling Entered By: Deon Pilling on 07/10/2019 13:36:15 -------------------------------------------------------------------------------- Compression Therapy Details Patient Name: Date of Service: [name], [name] 07/10/2019 1:30 PM Medical Record [id number] Patient Account Number: [id number] Date of Birth/Sex: Treating RN: Mar 18, 1935 (84 y.o. Lorette Ang, Tammi Klippel Primary Care Anaid Haney: Nicholes Stairs Other Clinician: Referring Manual Navarra: Treating Avaline Stillson/Extender:Stone III, Rae Halsted in Treatment: 1 Compression Therapy Performed for Wound Wound #1 Right Lower  Leg Assessment: Performed By: Clinician Kela Millin, RN Compression Type: Four Layer Pre Treatment ABI: 1.1 Electronic Signature(s) Signed: 07/10/2019 2:36:36 PM By: Deon Pilling Entered By: Deon Pilling on 07/10/2019 13:38:00 -------------------------------------------------------------------------------- Encounter Discharge Information Details Patient Name: Date of Service: [name] 07/10/2019 1:30 PM Medical Record [id number] Patient Account Number: [id number] Date of Birth/Sex: Treating RN: [birth year] (84 y.o. Hessie Diener Primary Care Lynett Brasil: Nicholes Stairs Other Clinician: Referring Mabrey Howland: Treating Moosa Bueche/Extender:Stone III, Lenard Galloway, Dorothe Pea in Treatment: 1 Encounter Discharge Information Items Discharge Condition: Stable Ambulatory Status: Walker Discharge Destination: Home Transportation: Private Auto Accompanied By: self Schedule Follow-up Appointment: Yes Clinical Summary of Care: Electronic Signature(s) Signed: 07/10/2019 2:36:36 PM By: Deon Pilling Entered By: Deon Pilling on 07/10/2019 13:39:19 -------------------------------------------------------------------------------- Pain Assessment Details Patient Name: Date of Service: [name], [name] 07/10/2019 1:30 PM Medical Record [id number] Patient Account Number: [id number] Date of Birth/Sex: Treating RN: July 21, 1935 (84 y.o. Hessie Diener Primary Care Juliah Scadden: Nicholes Stairs Other Clinician: Referring Nyja Westbrook: Treating Iysis Germain/Extender:Stone III, Rae Halsted in Treatment: 1 Active Problems Location of Pain Severity and Description of Pain Patient Has Paino Yes Site Locations Pain Location: Generalized Pain With Dressing Change: No Rate the pain. Current Pain Level: 0 Worst Pain Level: 8 Least Pain Level: 0 Tolerable Pain Level: 8 Character of Pain Describe the Pain: Aching Pain Management and Medication Current Pain  Management: Medication: No Cold Application: No Rest: No Massage: No Activity: No T.E.N.S.: No Heat Application: No Leg drop or elevation: No Is the Current Pain Management Adequate: Adequate How does your wound impact your activities of daily livingo Sleep: No Bathing: No Appetite: No Relationship With Others: No Bladder Continence: No Emotions: No Bowel Continence: No Work: No Toileting: No Drive: No Dressing: No Hobbies: No Electronic Signature(s) Signed: 07/10/2019 2:36:36 PM By: Deon Pilling Entered By: Deon Pilling on 07/10/2019 13:36:45 -------------------------------------------------------------------------------- Patient/Caregiver Education Details Patient Name: [name] 11/24/2020andnbsp1:30 Date of Service: PM Medical Record [id number]  Number: Patient Account Number: [id number] Treating RN: Date of Birth/Gender: [birth year] (84 y.o. Hessie Diener) Other Clinician: Primary Care Physician: Kennieth Francois Referring Physician: Physician/Extender: Deliah Boston in Treatment: 1 Education Assessment Education Provided To: Patient Education Topics Provided Wound/Skin Impairment: Handouts: Skin Care Do's and Dont's Methods: Explain/Verbal Responses: Reinforcements needed Electronic Signature(s) Signed: 07/10/2019 2:36:36 PM By: Deon Pilling Entered By: Deon Pilling on 07/10/2019 13:39:06 -------------------------------------------------------------------------------- Wound Assessment Details Patient Name: Date of Service: [name], [name] 07/10/2019 1:30 PM Medical Record [id number] Patient Account Number: [id number] Date of Birth/Sex: Treating RN: 28-Aug-1934 (84 y.o. Lorette Ang, Meta.Reding Primary Care Steffen Hase: Nicholes Stairs Other Clinician: Referring Tristy Udovich: Treating Estellar Cadena/Extender:Stone III, Rae Halsted in Treatment: 1 Wound Status Wound Number: 1 Primary  Abrasion Etiology: Wound Location: Right Lower Leg Wound Open Wounding Event: Trauma Status: Date Acquired: 05/17/2019 Comorbid Chronic Obstructive Pulmonary Disease Weeks Of Treatment: 1 History: (COPD), Hypertension, Neuropathy Clustered Wound: No Wound Measurements Length: (cm) 3 Width: (cm) 2.9 Depth: (cm) 0.7 Area: (cm) 6.833 Volume: (cm) 4.783 Wound Description Classification: Full Thickness Without Exposed Suppo Structures Wound Well defined, not attached Margin: Exudate Medium Amount: Exudate Serosanguineous Type: Exudate red, brown Color: Wound Bed Granulation Amount: Medium (34-66%) Granulation Quality: Pink, Pale Necrotic Amount: Medium (34-66%) Necrotic Quality: Adherent Slough or After Cleansing: No Fibrino Yes Exposed Structure Exposed: No er (Subcutaneous Tissue) Exposed: Yes sed: No sed: No ed: No d: No % Reduction in Area: -8.8% % Reduction in Volume: -26.9% Epithelialization: None Tunneling: No Undermining: No rt Foul Od Slough/ Fascia Fat Lay Tendon Expo Muscle Expo Joint Expos Bone Expose Treatment Notes Wound #1 (Right Lower Leg) 1. Cleanse With Wound Cleanser Soap and water 2. Periwound Care Moisturizing lotion 3. Primary Dressing Applied Collegen AG Hydrogel or K-Y Jelly 4. Secondary Dressing Drawtex 6. Support Layer Applied 4 layer compression wrap Notes netting. Electronic Signature(s) Signed: 07/10/2019 2:36:36 PM By: Deon Pilling Entered By: Deon Pilling on 07/10/2019 13:37:42 -------------------------------------------------------------------------------- Vitals Details Patient Name: Date of Service: [name] 07/10/2019 1:30 PM Medical Record [id number] Patient Account Number: [id number] Date of Birth/Sex: Treating RN: 09/12/1934 (84 y.o. Lorette Ang, Tammi Klippel Primary Care Amous Crewe: Nicholes Stairs Other Clinician: Referring Tanee Henery: Treating Asjia Berrios/Extender:Stone III, Rae Halsted in Treatment: 1 Vital Signs Time Taken: 13:35 Temperature (F): 98.2 Height (in): 68 Pulse (bpm): 91 Weight (lbs): 145 Respiratory Rate (breaths/min): 20 Body Mass Index (BMI): 22 Blood Pressure (mmHg): 164/64 Reference Range: 80 - 120 mg / dl Electronic Signature(s) Signed: 07/10/2019 2:36:36 PM By: Deon Pilling Entered By: Deon Pilling on 07/10/2019 13:37:19

## 2019-07-10 NOTE — Progress Notes (Signed)
JANUS, YUILLE ([id number]) Visit Report for 07/10/2019 SuperBill Details Patient Name: Date of Service: [name], [name] 07/10/2019 Medical Record [id number] Patient Account Number: [id number] Date of Birth/Sex: Treating RN: Mar 25, 1935 (84 y.o. Lorette Ang, Tammi Klippel Primary Care Provider: Nicholes Stairs Other Clinician: Referring Provider: Treating Provider/Extender:Stone III, Rae Halsted in Treatment: 1 Diagnosis Coding ICD-10 Codes Code Description S80.11XD Contusion of right lower leg, subsequent encounter L97.812 Non-pressure chronic ulcer of other part of right lower leg with fat layer exposed I87.333 Chronic venous hypertension (idiopathic) with ulcer and inflammation of bilateral lower extremity Facility Procedures CPT4 Code Description Modifier Quantity [id number] (Facility Use Only) [phone] - APPLY MULTLAY COMPRS LWR RT LEG 1 Electronic Signature(s) Signed: 07/10/2019 2:36:36 PM By: Deon Pilling Signed: 07/10/2019 10:29:27 PM By: Worthy Keeler PA-C Entered By: Deon Pilling on 07/10/2019 13:39:26

## 2019-07-17 ENCOUNTER — Encounter: Payer: Self-pay | Admitting: Physical Therapy

## 2019-07-17 ENCOUNTER — Other Ambulatory Visit: Payer: Self-pay

## 2019-07-17 ENCOUNTER — Ambulatory Visit: Payer: Medicare Other | Attending: Neurosurgery | Admitting: Physical Therapy

## 2019-07-17 DIAGNOSIS — M545 Low back pain, unspecified: Secondary | ICD-10-CM

## 2019-07-17 DIAGNOSIS — M6281 Muscle weakness (generalized): Secondary | ICD-10-CM | POA: Diagnosis present

## 2019-07-17 DIAGNOSIS — R262 Difficulty in walking, not elsewhere classified: Secondary | ICD-10-CM | POA: Diagnosis present

## 2019-07-17 NOTE — Patient Instructions (Signed)
Access Code: [email or web address]  URL: [email or web address]  Date: 07/17/2019  Prepared by: Ruben Im   Exercises  Seated Transversus Abdominis Bracing - 10 reps - 1 sets - 5 hold - 3x daily - 7x weekly  Seated Diagonal Chop with Medicine Ball - 10 reps - 1 sets - 1x daily - 7x weekly  Standing Heel Raise with Support - 10 reps - 1 sets - 1x daily - 7x weekly  Seated Shoulder Row with Anchored Resistance - 10 reps - 1 sets - 1x daily - 7x weekly  Seated Shoulder Extension and Scapular Retraction with Resistance - 10 reps - 1 sets - 1x daily - 7x weekly  Standing Hip Abduction with Counter Support - 5 reps - 1 sets - 1x daily - 7x weekly  Sit to Stand - 3 reps - 1 sets - 4x daily - 7x weekly  Standing Hip Extension with Counter Support - 5 reps - 1 sets - 1x daily - 7x weekly  Sideways Walking - 5 reps - 1 sets - 1x daily - 7x weekly

## 2019-07-17 NOTE — Therapy (Signed)
[facility] [address], [place], [place], [address] Phone: [phone]   Fax:  [phone]  Physical Therapy Treatment  Patient Details  Name: [name] MRN: [id number] Date of Birth: [birth year] Referring Provider (PT): Dr. Newman Pies   Encounter Date: 07/17/2019  PT End of Session - 07/17/19 1420    Visit Number  11    Date for PT Re-Evaluation  07/31/19    Authorization Type  Medicare 10th visit prog note:  KX at visit 15    PT Start Time  1015    PT Stop Time  1059    PT Time Calculation (min)  44 min    Activity Tolerance  Patient limited by fatigue;Other (comment)       Past Medical History:  Diagnosis Date  . Allergic rhinitis, cause unspecified   . Arthritis   . COPD (chronic obstructive pulmonary disease) ([place])   . Dyspnea   . Dysrhythmia    "1 episode of tachycardia in 1980's, been on it ever since"  . Emphysema of lung ([place])   . Hypertension   . Left inguinal hernia 02/07/2019  . Neuromuscular disorder ([place])   . Neuropathy    feet  . Pneumonia    History of, last Oct 2019  . Pulmonary nodule     Past Surgical History:  Procedure Laterality Date  . APPENDECTOMY    . CARDIAC CATHETERIZATION  2003   performed at [facility], Dr. Fransico Him  . CATARACT EXTRACTION, BILATERAL    . EYE SURGERY    . INGUINAL HERNIA REPAIR Left 02/07/2019   Procedure: OPEN REPAIR LEFT INGUINAL HERNIA WITH MESH;  Surgeon: Fanny Skates, MD;  Location: [facility];  Service: General;  Laterality: Left;  SPINAL AND TAP BLOCK ANESTHESIA  . ROTATOR CUFF REPAIR    . TONSILLECTOMY    . VASECTOMY      There were no vitals filed for this visit.  Subjective Assessment - 07/17/19 1015    Subjective  They changed the bandage on my leg and it feels better.  I'm concerned about the (covid) numbers especially with my problems.  This mask makes it hard to breathe.   My back hurts to the right side of my low back.  I tried to  go without the brace most of the day Friday and it really hurt on Saturday.    Pertinent History  wife can't help me, she has her own problems;  O2 full time;  RW full time;  has a home stair lift;  had pulmonary rehab 1 year ago;  he would like to get stronger to do pulmonary rehab after PT;  wearing brace but pt states the doctor said it was up to me whether to wear it or not    Currently in Pain?  Yes    Pain Score  2     Pain Location  Back    Pain Orientation  Lower;Right    Pain Type  Chronic pain         [facility] PT Assessment - 07/17/19 0001      Observation/Other Assessments   Activities of Balance Confidence Scale (ABC Scale)   36.5   initially 20.3                  [facility] Adult PT Treatment/Exercise - 07/17/19 0001      Ambulation/Gait   Gait Comments  gait with SPC 80 feet with assist to carry O2 tank and  CGA for balance       Therapeutic Activites    ADL's  walking, standing, curbs/steps, getting in/out of the car      Lumbar Exercises: Aerobic   Nustep  1 minute x4 while discussing progress       Lumbar Exercises: Seated   Sit to Stand  5 reps    Sit to Stand Limitations  from foam pad in chair without UE assist     Other Seated Lumbar Exercises  red band rows 20x    Other Seated Lumbar Exercises  red band extensions 10x       Knee/Hip Exercises: Standing   Other Standing Knee Exercises  review of all HEP:  heel raises, hip abduction, hip extension, side stepping and discussion of appropriate amounts (few reps, several times/day)  5x each              PT Education - 07/17/19 1426    Education Details  Access Code: [email or web address]  sit to stand;  red band row, extensions;  side stepping at the counter, hip abduction, hip extension    Person(s) Educated  Patient    Methods  Explanation;Demonstration;Handout    Comprehension  Returned demonstration;Verbalized understanding       PT Short Term Goals - 06/26/19 1214      PT SHORT TERM GOAL #1   Title   independent with initial HEP    Status  Achieved      PT SHORT TERM GOAL #2   Title  The patient will be able to ambulate 240 feet with pain level 4/10    Time  4    Period  Weeks    Status  On-going      PT SHORT TERM GOAL #3   Title  Timed up and Go speed  improved to 17.5 sec or less    Time  4    Period  Weeks    Status  On-going      PT SHORT TERM GOAL #4   Title  BERG test improved from 42/56 to 45/56    Status  Achieved        PT Long Term Goals - 06/05/19 2018      PT LONG TERM GOAL #1   Title  be independent in advanced HEP    Time  8    Period  Weeks    Status  New    Target Date  07/31/19      PT LONG TERM GOAL #2   Title  BERG balance test improved to 47/56 indicating improved gait safety and decreased risk of falls    Time  8    Period  Weeks    Status  New      PT LONG TERM GOAL #3   Title  The patient will be able to walk for 5 minutes with pain level 4/10 or less    Time  8    Period  Weeks    Status  New      PT LONG TERM GOAL #4   Title  Timed up and go speed improved to 16.5 sec or less    Time  8    Period  Weeks    Status  New      PT LONG TERM GOAL #5   Title  Activities Based Specific Scale (ABC) will indicate decreased fearfulness of falling from initial assessment    Time  8    Period  Weeks  Status  New            Plan - 07/17/19 1031    Clinical Impression Statement  The patient continues to be quite limited with PT progression secondary to respiratory status/COPD further exacerbated by wearing a mask.  He is also concerned about the increase in COVID numbers in the community.   We discussed a basic HEP for him to perform at home over the next 10 days where he could perform them without a mask and without the worry that his portable O2 would run out.  He would like to follow up at that time for a recheck of status and progress.  Therapist closely monitoring response and providing CGA for safety.    Rehab Potential  Good     PT Frequency  2x / week    PT Duration  8 weeks    PT Treatment/Interventions  ADLs/Self Care Home Management;Gait training;Functional mobility training;Therapeutic activities;Therapeutic exercise;Balance training;Neuromuscular re-education;Manual techniques;Patient/family education;Taping;Dry needling    PT Next Visit Plan  check 5x sit to stand test;  BERG; ABC, TUG;   patient needs assistance getting walker, cane and O2 getting in/out of the car;  gait with  QC;   Nu-Step 1 minute increments with rest breaks;  short bouts of seated and standing ex with monitoring of O2 sats; balance ex    PT Home Exercise Plan  Access Code: [email or web address]       Patient will benefit from skilled therapeutic intervention in order to improve the following deficits and impairments:  Difficulty walking, Cardiopulmonary status limiting activity, Decreased endurance, Decreased activity tolerance, Impaired perceived functional ability, Pain, Decreased balance, Decreased strength  Visit Diagnosis: Acute midline low back pain without sciatica  Muscle weakness (generalized)  Difficulty in walking, not elsewhere classified     Problem List Patient Active Problem List   Diagnosis Date Noted  . Left inguinal hernia 02/07/2019  . Left leg pain 05/18/2018  . Pneumonia 05/18/2018  . Chronic respiratory failure with hypoxia ([place]) 01/12/2015  . COPD with acute exacerbation ([place]) 01/21/2013  . Hyperlipidemia 12/01/2011  . Hypertension 12/01/2011  . CAD (coronary artery disease) 05/23/2011  . Lung nodule 09/16/2008  . ALLERGIC RHINITIS 07/24/2007  . COPD mixed type ([place]) 07/24/2007  . BURSITIS 07/24/2007   Ruben Im, PT 07/17/19 2:28 PM Phone: [phone] Fax: [phone] Alvera Singh 07/17/2019, Lars Pinks PM  [facility] [address] [place], [place], [address] Phone: [phone]   Fax:  [phone]  Name: [name] MRN:  [id number] Date of Birth: Sep 18, 1934

## 2019-07-19 ENCOUNTER — Encounter: Payer: Medicare Other | Admitting: Physical Therapy

## 2019-07-20 ENCOUNTER — Encounter (HOSPITAL_BASED_OUTPATIENT_CLINIC_OR_DEPARTMENT_OTHER): Payer: Medicare Other | Attending: Internal Medicine | Admitting: Internal Medicine

## 2019-07-20 ENCOUNTER — Other Ambulatory Visit: Payer: Self-pay

## 2019-07-20 DIAGNOSIS — S80811A Abrasion, right lower leg, initial encounter: Secondary | ICD-10-CM | POA: Diagnosis not present

## 2019-07-20 DIAGNOSIS — I87331 Chronic venous hypertension (idiopathic) with ulcer and inflammation of right lower extremity: Secondary | ICD-10-CM | POA: Diagnosis not present

## 2019-07-20 DIAGNOSIS — I87391 Chronic venous hypertension (idiopathic) with other complications of right lower extremity: Secondary | ICD-10-CM | POA: Diagnosis not present

## 2019-07-20 DIAGNOSIS — J9610 Chronic respiratory failure, unspecified whether with hypoxia or hypercapnia: Secondary | ICD-10-CM | POA: Diagnosis not present

## 2019-07-20 DIAGNOSIS — L97812 Non-pressure chronic ulcer of other part of right lower leg with fat layer exposed: Secondary | ICD-10-CM | POA: Diagnosis not present

## 2019-07-23 ENCOUNTER — Other Ambulatory Visit: Payer: Self-pay

## 2019-07-23 ENCOUNTER — Encounter: Payer: Self-pay | Admitting: Internal Medicine

## 2019-07-23 ENCOUNTER — Ambulatory Visit (INDEPENDENT_AMBULATORY_CARE_PROVIDER_SITE_OTHER): Payer: Medicare Other | Admitting: Internal Medicine

## 2019-07-23 DIAGNOSIS — Z23 Encounter for immunization: Secondary | ICD-10-CM

## 2019-07-23 DIAGNOSIS — J449 Chronic obstructive pulmonary disease, unspecified: Secondary | ICD-10-CM

## 2019-07-23 DIAGNOSIS — D649 Anemia, unspecified: Secondary | ICD-10-CM | POA: Diagnosis not present

## 2019-07-23 DIAGNOSIS — J9611 Chronic respiratory failure with hypoxia: Secondary | ICD-10-CM | POA: Diagnosis not present

## 2019-07-23 LAB — BASIC METABOLIC PANEL
BUN: 15 (ref 4–21)
CO2: 36 — AB (ref 13–22)
Chloride: 97 — AB (ref 99–108)
Creatinine: 0.4 — AB (ref <=1.3)
Glucose: 78
Potassium: 5 (ref 3.4–5.3)
Sodium: 137 (ref 137–147)

## 2019-07-23 LAB — COMPREHENSIVE METABOLIC PANEL
Albumin: 3.9 (ref 3.5–5.0)
Calcium: 9.4 (ref 8.7–10.7)

## 2019-07-23 MED ORDER — BREZTRI AEROSPHERE 160-9-4.8 MCG/ACT IN AERO: 1.0000 | INHALATION_SPRAY | Freq: Two times a day (BID) | RESPIRATORY_TRACT | 0 refills | Status: DC

## 2019-07-23 MED ORDER — BREZTRI AEROSPHERE 160-9-4.8 MCG/ACT IN AERO: 2.0000 | INHALATION_SPRAY | Freq: Two times a day (BID) | RESPIRATORY_TRACT | 12 refills | Status: DC

## 2019-07-23 NOTE — Progress Notes (Signed)
Patient ID: [name], male    DOB: 06-18-1935, 84 y.o.   MRN: [id number] PCP Dr Hulan Fess  HPI male former smoker followed for COPD/emphysema, pulmonary nodules, hypoxic respiratory failure, complicated by CAD [id number]  MM PFT 04/27/11- Echocardiogram 07/31/2018-EF 55-60%, grade 1 DD, PHN 49 mmHg ------------------------------------------------------------------------------  03/21/2019 Virtual Visit via Telephone Note History of Present Illness:  84 year old male former smoker followed for COPD/emphysema, pulmonary nodules, hypoxic respiratory failure, complicated by CAD O2 3 - 4L/[employer] Recent L inguinal hernia repair. Few weeks later fell at home hurting low back> lumbar compression fx treated with corset Spiriva, Symbicort, neb Duoneb, albuerol hfa CT showed LLL pneumonia Rx'd with doxycycline. He never noted cough or breathing discomfort symptoms. Doxy finished 4-5 days ago. Now getting PT and OT before return home. Covid careful.  Observations/Objective: CTa chest 02/28/2019-  IMPRESSION: 1.  Negative examination for pulmonary embolism. 2. There is new heterogeneous airspace opacity of the lingula (series 10, image 102), likely infectious or inflammatory. Recommend follow-up CT in 3 months to exclude malignancy. 3. There is a new minimally displaced superior endplate fracture of the L1 vertebral body with adjacent retroperitoneal fat stranding and hematoma (series 12, image 103, series series 9, image 472). 4.  Emphysema. 5.  Coronary artery disease and aortic atherosclerosis. CXR 03/20/2019-   Assessment and Plan: COPD- no exacerbation Plan- continue present meds and O2 LLL CAP- responded to doxycycline and resolved on CXR report from [facility] Follow Up Instructions: 4 months   07/23/2019- . 84 year old male former smoker followed for COPD/emphysema, pulmonary nodules, hypoxic respiratory failure, complicated by CAD, Anemia,  O2 3 -  4L/[employer] Proair hfa, neb Duoneb, Spiriva 2.5 Respimat, Symbicort 160,  Comes with nursing aide, rolling walker and back brace after fall with vertebral fx. Was bed-bound for a month and notes persistent worse DOE since that fall.  Cough productive clear. Seen by Hematology for normocytic anemia. Not in pulm rehab for the past year. Had dropped O2 tank on leg> RLE wrapped.  Review of Systems-see HPI   + = positive Constitutional:   No-   weight loss, night sweats fevers, no-chills, fatigue, lassitude. HEENT:   No-  headaches, difficulty swallowing, tooth/dental problems, sore throat,       No-  sneezing, itching, ear ache, nasal congestion, post nasal drip,  CV:  No-chest pain, no-orthopnea, PND, No-swelling in lower extremities, anasarca, dizziness, palpitations Resp: + shortness of breath with exertion, not at rest.               cough,  + non-productive cough,  No-  coughing up of blood.              No change color of mucus.  No- wheezing.   Skin: No-   rash or lesions. GI:  No-   heartburn, indigestion, abdominal pain, nausea,   GU: No-   dysuria,  MS:  No-   joint pain or swelling.  Neuro-  Psych:  No- change in mood or affect. No acute depression or anxiety.  No memory loss.  OBJ-    + looks frail General- Alert, Oriented, Affect-appropriate, Distress- none acute. + O2 4L , rolling walker Skin-  Lymphadenopathy- none Head- atraumatic            Eyes- Gross vision intact, PERRLA, conjunctivae clear secretions            Ears- Hearing, canals-normal  Nose- Clear, no-Septal dev, mucus, polyps, erosion, perforation             Throat- Mallampati II , mucosa clear , drainage- none, tonsils- atrophic.  Neck- flexible , trachea midline, no stridor , thyroid nl, carotid no bruit Chest - symmetrical excursion , unlabored           Heart/CV- RRR , no murmur , no gallop  , no rub, nl s1 s2                           - JVD- none , edema-none, stasis changes- none, varices-  none           Lung-  Distant+, wheeze -none, unlabored, cough-none, dullness-none, rub- none           Chest wall- +Back brace Abd-  Br/ Gen/ Rectal- Not done, not indicated Extrem- cyanosis- none, clubbing, none, atrophy- none, strength- nl. +RLE wrapped Neuro- grossly intact to observation

## 2019-07-23 NOTE — Patient Instructions (Addendum)
Order-  Pneumovax 23  Sample and print script for Breztri inhaler    Inhale 2 puffs, twice daily  Try this instead of Spiriva plus symbicort for comparison.  If you like Breztri samples, you can take the print script to your drug store for price comparison.  Please call if we can help

## 2019-07-24 ENCOUNTER — Encounter: Payer: Medicare Other | Admitting: Physical Therapy

## 2019-07-24 NOTE — Progress Notes (Signed)
SHERRILL, TESCHENDORF ([id number]) Visit Report for 06/29/2019 Chief Complaint Document Details Patient Name: Date of Service: [name], [name] 06/29/2019 10:30 AM Medical Record [id number] Patient Account Number: [id number] Date of Birth/Sex: Treating RN: [birth year] (84 y.o. [name] Primary Care Provider: Nicholes Stairs Other Clinician: Referring Provider: Treating Provider/Extender:Robson, Vickey Sages, Dorothe Pea in Treatment: 0 Information Obtained from: Patient Chief Complaint 06/29/2019; patient is here for review of the remanence of a hematoma on his right medial lower leg Electronic Signature(s) Signed: 06/29/2019 5:33:43 PM By: Linton Ham MD Entered By: Linton Ham on 06/29/2019 12:13:56 -------------------------------------------------------------------------------- Debridement Details Patient Name: Date of Service: [name] 06/29/2019 10:30 AM Medical Record [id number] Patient Account Number: [id number] Date of Birth/Sex: March 14, 1935 (84 y.o. M) Treating RN: Kela Millin Primary Care Provider: Nicholes Stairs Other Clinician: Referring Provider: Treating Provider/Extender:Robson, Vickey Sages, Dorothe Pea in Treatment: 0 Debridement Performed for Wound #1 Right Lower Leg Assessment: Performed By: Physician Ricard Dillon., MD Debridement Type: Debridement Level of Consciousness (Pre- Awake and Alert procedure): Pre-procedure Verification/Time Out Taken: Yes - 11:49 Start Time: 11:49 Pain Control: Other : benzocaine, 20% Total Area Debrided (L x W): 3.2 (cm) x 2.5 (cm) = 8 (cm) Tissue and other material Viable, Non-Viable, Slough, Subcutaneous, Slough debrided: Level: Skin/Subcutaneous Tissue Debridement Description: Excisional Instrument: Curette Bleeding: Minimum Hemostasis Achieved: Pressure End Time: 11:50 Procedural Pain: 2 Post Procedural Pain: 0 Response to Treatment: Procedure was  tolerated well Level of Consciousness Awake and Alert (Post-procedure): Post Debridement Measurements of Total Wound Length: (cm) 3.2 Width: (cm) 2.5 Depth: (cm) 0.6 Volume: (cm) 3.77 Character of Wound/Ulcer Post Improved Debridement: Post Procedure Diagnosis Same as Pre-procedure Electronic Signature(s) Signed: 06/29/2019 5:20:59 PM By: Kela Millin Signed: 06/29/2019 5:33:43 PM By: Linton Ham MD Entered By: Linton Ham on 06/29/2019 12:10:06 -------------------------------------------------------------------------------- HPI Details Patient Name: Date of Service: [name] 06/29/2019 10:30 AM Medical Record [id number] Patient Account Number: [id number] Date of Birth/Sex: Treating RN: 12-03-1934 (84 y.o. [name] Primary Care Provider: Nicholes Stairs Other Clinician: Referring Provider: Treating Provider/Extender:Robson, Vickey Sages, Dorothe Pea in Treatment: 0 History of Present Illness HPI Description: ADMISSION 06/29/2019 This is an 84 year old man who was referred here for review of the wound on the right medial lower leg. This started as trauma after he dropped his oxygen tank on his leg. By his description he had an intact hematoma that lasted for about 2 weeks and then it spontaneously opened on its own. He was seen by his primary doctor and put on antibiotic therapy on 10/21 with cephalexin 500 twice a day. He completed this recently. The patient has been placing Vaseline gauze over the wound area. He is not in any compression. Past medical history; COPD with chronic oxygen therapy, back pain, gait instability, status post left inguinal hernia repair on 02/07/2019, chronic venous insufficiency, chronic atrial fibrillation. He is currently going to outpatient physical therapy ABIs in our clinic were 1.08 on the right Electronic Signature(s) Signed: 06/29/2019 5:33:43 PM By: Linton Ham MD Entered By: Linton Ham on 06/29/2019 12:20:32 -------------------------------------------------------------------------------- Physical Exam Details Patient Name: Date of Service: [name], [name] 06/29/2019 10:30 AM Medical Record [id number] Patient Account Number: [id number] Date of Birth/Sex: Treating RN: [birth year] (84 y.o. [name] Primary Care Provider: Nicholes Stairs Other Clinician: Referring Provider: Treating Provider/Extender:Robson, Vickey Sages, Dorothe Pea in Treatment: 0 Constitutional Patient is hypertensive.. Pulse regular and within target range for patient.[name] Respirations regular, non-labored and within  target range.. Temperature is normal and within the target range for the patient.[name] Appears in no distress. Respiratory work of breathing is normal. Decreased air entry bilaterally mild expiratory wheeze but work of breathing appears to be normal. Cardiovascular Sounds are distant JVP is not elevated no murmurs or gallops. Femoral pulses palpable. Pedal pulses are palpable. Lymphatic None palpable in the right popliteal or inguinal area. Integumentary (Hair, Skin) No primary cutaneous issues. Psychiatric appears at normal baseline. Notes Wound exam; right lower calf. Completely nonviable surface on the wound. There is also some surrounding pitting edema. Using a #5 curette extensive debridement of nonviable subcutaneous tissue over the wound surface. There is an undermining area from roughly 8-12 o'clock but is only roughly a centimeter. There is no evidence of surrounding infection. Electronic Signature(s) Signed: 06/29/2019 5:33:43 PM By: Linton Ham MD Entered By: Linton Ham on 06/29/2019 12:24:53 -------------------------------------------------------------------------------- Physician Orders Details Patient Name: Date of Service: [name] 06/29/2019 10:30 AM Medical Record [id number] Patient Account Number:  [id number] Date of Birth/Sex: Treating RN: 1935-02-06 (84 y.o. [name] Primary Care Provider: Nicholes Stairs Other Clinician: Referring Provider: Treating Provider/Extender:Robson, Vickey Sages, Dorothe Pea in Treatment: 0 Verbal / Phone Orders: No Diagnosis Coding Follow-up Appointments Return Appointment in 1 week. Dressing Change Frequency Do not change entire dressing for one week. Wound Cleansing May shower with protection. Primary Wound Dressing Wound #1 Right Lower Leg Calcium Alginate with Silver Secondary Dressing Wound #1 Right Lower Leg Dry Gauze ABD pad Edema Control 4 layer compression - Right Lower Extremity Avoid standing for long periods of time Elevate legs to the level of the heart or above for 30 minutes daily and/or when sitting, a frequency of: Exercise regularly Electronic Signature(s) Signed: 06/29/2019 5:20:59 PM By: Kela Millin Signed: 06/29/2019 5:33:43 PM By: Linton Ham MD Entered By: Kela Millin on 06/29/2019 11:54:15 -------------------------------------------------------------------------------- Problem List Details Patient Name: Date of Service: [name] 06/29/2019 10:30 AM Medical Record [id number] Patient Account Number: [id number] Date of Birth/Sex: Treating RN: 1935-05-27 (84 y.o. [name] Primary Care Provider: Nicholes Stairs Other Clinician: Referring Provider: Treating Provider/Extender:Robson, Vickey Sages, Dorothe Pea in Treatment: 0 Active Problems ICD-10 Evaluated Encounter Code Description Active Date Today Diagnosis S80.11XD Contusion of right lower leg, subsequent encounter 06/29/2019 No Yes L97.812 Non-pressure chronic ulcer of other part of right lower 06/29/2019 No Yes leg with fat layer exposed Inactive Problems Resolved Problems Electronic Signature(s) Signed: 06/29/2019 5:33:43 PM By: Linton Ham MD Entered By: Linton Ham on 06/29/2019  12:09:46 -------------------------------------------------------------------------------- Progress Note Details Patient Name: Date of Service: [name] 06/29/2019 10:30 AM Medical Record [id number] Patient Account Number: [id number] Date of Birth/Sex: Treating RN: [birth year] (84 y.o. [name] Primary Care Provider: Nicholes Stairs Other Clinician: Referring Provider: Treating Provider/Extender:Robson, Vickey Sages, Dorothe Pea in Treatment: 0 Subjective Chief Complaint Information obtained from Patient 06/29/2019; patient is here for review of the remanence of a hematoma on his right medial lower leg History of Present Illness (HPI) ADMISSION 06/29/2019 This is an 84 year old man who was referred here for review of the wound on the right medial lower leg. This started as trauma after he dropped his oxygen tank on his leg. By his description he had an intact hematoma that lasted for about 2 weeks and then it spontaneously opened on its own. He was seen by his primary doctor and put on antibiotic therapy on 10/21 with cephalexin 500 twice a day. He completed this recently.  The patient has been placing Vaseline gauze over the wound area. He is not in any compression. Past medical history; COPD with chronic oxygen therapy, back pain, gait instability, status post left inguinal hernia repair on 02/07/2019, chronic venous insufficiency, chronic atrial fibrillation. He is currently going to outpatient physical therapy ABIs in our clinic were 1.08 on the right Patient History Information obtained from Patient. Allergies Augmentin, Neosporin (neo-bac-polym), NSAIDS (Non-Steroidal Anti-Inflammatory Drug), montelukast, Tolectin Family History Lung Disease - Mother, Stroke - Father, No family history of Cancer, Diabetes, Heart Disease, Hereditary Spherocytosis, Hypertension, Kidney Disease, Seizures, Thyroid Problems, Tuberculosis. Social History Former  smoker, Marital Status - Married, Alcohol Use - Never, Drug Use - No History, Caffeine Use - Daily. Medical History Eyes Denies history of Cataracts, Glaucoma, Optic Neuritis Ear/Nose/Mouth/Throat Denies history of Chronic sinus problems/congestion, Middle ear problems Hematologic/Lymphatic Denies history of Anemia, Hemophilia, Human Immunodeficiency Virus, Lymphedema, Sickle Cell Disease Respiratory Patient has history of Chronic Obstructive Pulmonary Disease (COPD) Denies history of Aspiration, Asthma, Pneumothorax, Sleep Apnea, Tuberculosis Cardiovascular Patient has history of Hypertension Denies history of Angina, Arrhythmia, Congestive Heart Failure, Coronary Artery Disease, Deep Vein Thrombosis, Hypotension, Myocardial Infarction, Peripheral Arterial Disease, Peripheral Venous Disease, Phlebitis, Vasculitis Gastrointestinal Denies history of Cirrhosis , Colitis, Crohnoos, Hepatitis A, Hepatitis B, Hepatitis C Endocrine Denies history of Type I Diabetes, Type II Diabetes Genitourinary Denies history of End Stage Renal Disease Immunological Denies history of Lupus Erythematosus, Raynaudoos, Scleroderma Integumentary (Skin) Denies history of History of Burn Musculoskeletal Denies history of Gout, Rheumatoid Arthritis, Osteoarthritis, Osteomyelitis Neurologic Patient has history of Neuropathy Denies history of Dementia, Quadriplegia, Paraplegia, Seizure Disorder Oncologic Denies history of Received Chemotherapy, Received Radiation Psychiatric Denies history of Anorexia/bulimia, Confinement Anxiety Review of Systems (ROS) Constitutional Symptoms (General Health) Denies complaints or symptoms of Fatigue, Fever, Chills, Marked Weight Change. Eyes Complains or has symptoms of Vision Changes. Denies complaints or symptoms of Dry Eyes, Glasses / Contacts. Ear/Nose/Mouth/Throat Denies complaints or symptoms of Chronic sinus problems or rhinitis. Respiratory Denies complaints  or symptoms of Chronic or frequent coughs, Shortness of Breath. Cardiovascular Denies complaints or symptoms of Chest pain. Gastrointestinal Denies complaints or symptoms of Frequent diarrhea, Nausea, Vomiting. Endocrine Denies complaints or symptoms of Heat/cold intolerance. Genitourinary Denies complaints or symptoms of Frequent urination. Integumentary (Skin) Complains or has symptoms of Wounds. Musculoskeletal Denies complaints or symptoms of Muscle Pain, Muscle Weakness. Neurologic Denies complaints or symptoms of Numbness/parasthesias. Psychiatric Denies complaints or symptoms of Claustrophobia, Suicidal. Objective Constitutional Patient is hypertensive.. Pulse regular and within target range for patient.[name] Respirations regular, non-labored and within target range.. Temperature is normal and within the target range for the patient.[name] Appears in no distress. Vitals Time Taken: 10:54 AM, Height: 68 in, Source: Stated, Weight: 145 lbs, Source: Stated, BMI: 22, Temperature: 98.1 F, Pulse: 88 bpm, Respiratory Rate: 18 breaths/min, Blood Pressure: 148/76 mmHg. Respiratory work of breathing is normal. Decreased air entry bilaterally mild expiratory wheeze but work of breathing appears to be normal. Cardiovascular Sounds are distant JVP is not elevated no murmurs or gallops. Femoral pulses palpable. Pedal pulses are palpable. Lymphatic None palpable in the right popliteal or inguinal area. Psychiatric appears at normal baseline. General Notes: Wound exam; right lower calf. Completely nonviable surface on the wound. There is also some surrounding pitting edema. Using a #5 curette extensive debridement of nonviable subcutaneous tissue over the wound surface. There is an undermining area from roughly 8-12 o'clock but is only roughly a centimeter. There is no evidence of surrounding infection. Integumentary (  Hair, Skin) No primary cutaneous issues. Wound #1 status is Open. Original  cause of wound was Trauma. The wound is located on the Right Lower Leg. The wound measures 3.2cm length x 2.5cm width x 0.6cm depth; 6.283cm^2 area and 3.77cm^3 volume. There is no tunneling noted, however, there is undermining starting at 8:00 and ending at 12:00 with a maximum distance of 0.7cm. There is a medium amount of serosanguineous drainage noted. The wound margin is well defined and not attached to the wound base. There is small (1-33%) pink, pale granulation within the wound bed. There is a large (67-100%) amount of necrotic tissue within the wound bed including Adherent Slough. Assessment Active Problems ICD-10 Contusion of right lower leg, subsequent encounter Non-pressure chronic ulcer of other part of right lower leg with fat layer exposed Procedures Wound #1 Pre-procedure diagnosis of Wound #1 is a Lymphedema located on the Right Lower Leg . There was a Excisional Skin/Subcutaneous Tissue Debridement with a total area of 8 sq cm performed by Ricard Dillon., MD. With the following instrument(s): Curette to remove Viable and Non-Viable tissue/material. Material removed includes Subcutaneous Tissue and Slough and after achieving pain control using Other (benzocaine, 20%). No specimens were taken. A time out was conducted at 11:49, prior to the start of the procedure. A Minimum amount of bleeding was controlled with Pressure. The procedure was tolerated well with a pain level of 2 throughout and a pain level of 0 following the procedure. Post Debridement Measurements: 3.2cm length x 2.5cm width x 0.6cm depth; 3.77cm^3 volume. Character of Wound/Ulcer Post Debridement is improved. Post procedure Diagnosis Wound #1: Same as Pre-Procedure Plan Follow-up Appointments: Return Appointment in 1 week. Dressing Change Frequency: Do not change entire dressing for one week. Wound Cleansing: May shower with protection. Primary Wound Dressing: Wound #1 Right Lower Leg: Calcium  Alginate with Silver Secondary Dressing: Wound #1 Right Lower Leg: Dry Gauze ABD pad Edema Control: 4 layer compression - Right Lower Extremity Avoid standing for long periods of time Elevate legs to the level of the heart or above for 30 minutes daily and/or when sitting, a frequency of: Exercise regularly 1. Primary dressing is silver alginate/ABD under 4-layer compression 2. I saw no evidence of infection no cultures or antibiotics were felt to be necessary 3. His arterial exam was normal. 4 layer compression was felt to be safe 4. He has edema in the right leg which is pitting. This may be a component of chronic venous insufficiency but wound would have to wonder about pulmonary hypertension/cor pulmonale 5. As the patient is going to outpatient therapy he is not eligible for home health. We put this dressing on Tuesday a week Electronic Signature(s) Signed: 06/29/2019 5:33:43 PM By: Linton Ham MD Entered By: Linton Ham on 06/29/2019 12:26:25 -------------------------------------------------------------------------------- HxROS Details Patient Name: Date of Service: [name] 06/29/2019 10:30 AM Medical Record [id number] Patient Account Number: [id number] Date of Birth/Sex: Treating RN: [birth year] (84 y.o. [name]) Carlene Coria Primary Care Provider: Nicholes Stairs Other Clinician: Referring Provider: Treating Provider/Extender:Robson, Vickey Sages, Dorothe Pea in Treatment: 0 Information Obtained From Patient Constitutional Symptoms (General Health) Complaints and Symptoms: Negative for: Fatigue; Fever; Chills; Marked Weight Change Eyes Complaints and Symptoms: Positive for: Vision Changes Negative for: Dry Eyes; Glasses / Contacts Medical History: Negative for: Cataracts; Glaucoma; Optic Neuritis Ear/Nose/Mouth/Throat Complaints and Symptoms: Negative for: Chronic sinus problems or rhinitis Medical History: Negative for: Chronic sinus  problems/congestion; Middle ear problems Respiratory Complaints and Symptoms: Negative for:  Chronic or frequent coughs; Shortness of Breath Medical History: Positive for: Chronic Obstructive Pulmonary Disease (COPD) Negative for: Aspiration; Asthma; Pneumothorax; Sleep Apnea; Tuberculosis Cardiovascular Complaints and Symptoms: Negative for: Chest pain Medical History: Positive for: Hypertension Negative for: Angina; Arrhythmia; Congestive Heart Failure; Coronary Artery Disease; Deep Vein Thrombosis; Hypotension; Myocardial Infarction; Peripheral Arterial Disease; Peripheral Venous Disease; Phlebitis; Vasculitis Gastrointestinal Complaints and Symptoms: Negative for: Frequent diarrhea; Nausea; Vomiting Medical History: Negative for: Cirrhosis ; Colitis; Crohns; Hepatitis A; Hepatitis B; Hepatitis C Endocrine Complaints and Symptoms: Negative for: Heat/cold intolerance Medical History: Negative for: Type I Diabetes; Type II Diabetes Genitourinary Complaints and Symptoms: Negative for: Frequent urination Medical History: Negative for: End Stage Renal Disease Integumentary (Skin) Complaints and Symptoms: Positive for: Wounds Medical History: Negative for: History of Burn Musculoskeletal Complaints and Symptoms: Negative for: Muscle Pain; Muscle Weakness Medical History: Negative for: Gout; Rheumatoid Arthritis; Osteoarthritis; Osteomyelitis Neurologic Complaints and Symptoms: Negative for: Numbness/parasthesias Medical History: Positive for: Neuropathy Negative for: Dementia; Quadriplegia; Paraplegia; Seizure Disorder Psychiatric Complaints and Symptoms: Negative for: Claustrophobia; Suicidal Medical History: Negative for: Anorexia/bulimia; Confinement Anxiety Hematologic/Lymphatic Medical History: Negative for: Anemia; Hemophilia; Human Immunodeficiency Virus; Lymphedema; Sickle Cell Disease Immunological Medical History: Negative for: Lupus Erythematosus;  Raynauds; Scleroderma Oncologic Medical History: Negative for: Received Chemotherapy; Received Radiation Immunizations Pneumococcal Vaccine: Received Pneumococcal Vaccination: No Implantable Devices None Family and Social History Cancer: No; Diabetes: No; Heart Disease: No; Hereditary Spherocytosis: No; Hypertension: No; Kidney Disease: No; Lung Disease: Yes - Mother; Seizures: No; Stroke: Yes - Father; Thyroid Problems: No; Tuberculosis: No; Former smoker; Marital Status - Married; Alcohol Use: Never; Drug Use: No History; Caffeine Use: Daily; Financial Concerns: No; Food, Clothing or Shelter Needs: No; Support System Lacking: No; Transportation Concerns: No Electronic Signature(s) Signed: 06/29/2019 5:33:43 PM By: Linton Ham MD Signed: 07/24/2019 2:49:42 PM By: Carlene Coria RN Entered By: Carlene Coria on 06/29/2019 10:59:36 -------------------------------------------------------------------------------- SuperBill Details Patient Name: Date of Service: [name] 06/29/2019 Medical Record [id number] Patient Account Number: [id number] Date of Birth/Sex: Treating RN: June 13, 1935 (84 y.o. [name] Primary Care Provider: Nicholes Stairs Other Clinician: Referring Provider: Treating Provider/Extender:Robson, Vickey Sages, Dorothe Pea in Treatment: 0 Diagnosis Coding ICD-10 Codes Code Description S80.11XD Contusion of right lower leg, subsequent encounter L97.812 Non-pressure chronic ulcer of other part of right lower leg with fat layer exposed Facility Procedures CPT4 Code Description: [id number] [place] VISIT-LEV 3 EST PT Modifier: 25 Quantity: 1 CPT4 Code Description: [id number] [place] - DEB SUBQ TISSUE 20 SQ CM/< ICD-10 Diagnosis Description [id number] Non-pressure chronic ulcer of other part of right lower l Modifier: eg with fat lay Quantity: 1 er exposed Physician Procedures CPT4 Code Description: [id number] [place] PHYS LEVEL 3 NEW PT ICD-10  Diagnosis Description S80.11XD Contusion of right lower leg, subsequent encounter L97.812 Non-pressure chronic ulcer of other part of right lower l [id number] - WC PHYS SUBQ TISS 20 SQ CM  ICD-10 Diagnosis Description [id number] Non-pressure chronic ulcer of other part of right lower leg Modifier: 25 eg with fat lay with fat layer Quantity: 1 er exposed 1 exposed Electronic Signature(s) Signed: 06/29/2019 5:20:59 PM By: Kela Millin Signed: 06/29/2019 5:33:43 PM By: Linton Ham MD Entered By: Kela Millin on 06/29/2019 12:41:32

## 2019-07-24 NOTE — Progress Notes (Signed)
[name] ([id number]) Visit Report for 07/06/2019 Arrival Information Details Patient Name: Date of Service: [name], [name] 07/06/2019 10:30 AM Medical Record [id number] Patient Account Number: [id number] Date of Birth/Sex: Treating RN: [birth year] (84 y.o. [name]) Carlene Coria Primary Care Ocean Kearley: Nicholes Stairs Other Clinician: Referring Janelly Switalski: Treating Ijanae Macapagal/Extender:Robson, Vickey Sages, Dorothe Pea in Treatment: 1 Visit Information History Since Last Visit All ordered tests and consults were completed: No Patient Arrived: Gilford Rile Added or deleted any medications: No Arrival Time: 10:54 caregiver Any new allergies or adverse reactions: No Accompanied By: Had a fall or experienced change in No Transfer Assistance: None activities of daily living that may affect Patient Identification Verified: Yes risk of falls: Secondary Verification Process Completed: Yes Signs or symptoms of abuse/neglect since last No Patient Requires Transmission-Based No visito Precautions: Hospitalized since last visit: No Patient Has Alerts: No Implantable device outside of the clinic excluding No cellular tissue based products placed in the center since last visit: Has Dressing in Place as Prescribed: Yes Has Compression in Place as Prescribed: Yes Pain Present Now: No Electronic Signature(s) Signed: 07/24/2019 2:50:47 PM By: Carlene Coria RN Entered By: Carlene Coria on 07/06/2019 10:54:42 -------------------------------------------------------------------------------- Compression Therapy Details Patient Name: Date of Service: [name] 07/06/2019 10:30 AM Medical Record [id number] Patient Account Number: [id number] Date of Birth/Sex: Treating RN: 03-Oct-1934 (84 y.o. [name] Primary Care Manolo Bosket: Nicholes Stairs Other Clinician: Referring Tiffnay Bossi: Treating Faiza Bansal/Extender:Robson, Vickey Sages, Dorothe Pea in Treatment:  1 Compression Therapy Performed for Wound Wound #1 Right Lower Leg Assessment: Performed By: Clinician Deon Pilling, RN Compression Type: Four Layer Post Procedure Diagnosis Same as Pre-procedure Electronic Signature(s) Signed: 07/09/2019 2:17:56 PM By: Kela Millin Entered By: Kela Millin on 07/06/2019 12:07:07 -------------------------------------------------------------------------------- Encounter Discharge Information Details Patient Name: Date of Service: [name] 07/06/2019 10:30 AM Medical Record [id number] Patient Account Number: [id number] Date of Birth/Sex: Treating RN: 04-Apr-1935 (84 y.o. Lorette Ang, Tammi Klippel Primary Care Meygan Kyser: Nicholes Stairs Other Clinician: Referring Haili Donofrio: Treating Jalilah Wiltsie/Extender:Robson, Vickey Sages, Dorothe Pea in Treatment: 1 Encounter Discharge Information Items Discharge Condition: Stable Ambulatory Status: Walker Discharge Destination: Home Transportation: Private Auto Accompanied By: self Schedule Follow-up Appointment: Yes Clinical Summary of Care: Electronic Signature(s) Signed: 07/06/2019 6:01:02 PM By: Deon Pilling Entered By: Deon Pilling on 07/06/2019 12:22:20 -------------------------------------------------------------------------------- Lower Extremity Assessment Details Patient Name: Date of Service: [name] 07/06/2019 10:30 AM Medical Record [id number] Patient Account Number: [id number] Date of Birth/Sex: Treating RN: 17-Nov-1934 (84 y.o. [name]) Carlene Coria Primary Care Leviathan Macera: Nicholes Stairs Other Clinician: Referring Kaylena Pacifico: Treating Ott Zimmerle/Extender:Robson, Vickey Sages, Dorothe Pea in Treatment: 1 Edema Assessment Assessed: [Left: No] [Right: No] Edema: [Left: Ye] [Right: s] Calf Left: Right: Point of Measurement: 39 cm From Medial Instep cm 29.5 cm Ankle Left: Right: Point of Measurement: 10 cm From Medial Instep cm 21 cm Electronic  Signature(s) Signed: 07/24/2019 2:50:47 PM By: Carlene Coria RN Entered By: Carlene Coria on 07/06/2019 10:55:47 -------------------------------------------------------------------------------- Multi Wound Chart Details Patient Name: Date of Service: [name] 07/06/2019 10:30 AM Medical Record [id number] Patient Account Number: [id number] Date of Birth/Sex: Treating RN: [birth year] (84 y.o. [name] Primary Care Gibran Veselka: Nicholes Stairs Other Clinician: Referring Samaya Boardley: Treating Westley Blass/Extender:Robson, Vickey Sages, Dorothe Pea in Treatment: 1 Vital Signs Height(in): 68 Pulse(bpm): 69 Weight(lbs): 145 Blood Pressure(mmHg): 152/69 Body Mass Index(BMI): 22 Temperature(F): 98.6 Respiratory 18 Rate(breaths/min): Photos: [1:No Photos] [N/A:N/A] Wound Location: [1:Right Lower Leg] [N/A:N/A] Wounding Event: [1:Trauma] [N/A:N/A] Primary Etiology: [1:Abrasion] [  N/A:N/A] Comorbid History: [1:Chronic Obstructive Pulmonary Disease (COPD), Hypertension, Neuropathy] [N/A:N/A] Date Acquired: [1:05/17/2019] [N/A:N/A] Weeks of Treatment: [1:1] [N/A:N/A] Wound Status: [1:Open] [N/A:N/A] Measurements L x W x D 3x2.9x0.7 [N/A:N/A] (cm) Area (cm) : [1:6.833] [N/A:N/A] Volume (cm) : [1:4.783] [N/A:N/A] % Reduction in Area: [1:-8.80%] [N/A:N/A] % Reduction in Volume: -26.90% [N/A:N/A] Classification: [1:Full Thickness Without Exposed Support Structures] [N/A:N/A] Exudate Amount: [1:Medium] [N/A:N/A] Exudate Type: [1:Serosanguineous] [N/A:N/A] Exudate Color: [1:red, brown] [N/A:N/A] Wound Margin: [1:Well defined, not attached] [N/A:N/A] Granulation Amount: [1:Medium (34-66%)] [N/A:N/A] Granulation Quality: [1:Pink, Pale] [N/A:N/A] Necrotic Amount: [1:Medium (34-66%)] [N/A:N/A] Exposed Structures: [1:Fat Layer (Subcutaneous Tissue) Exposed: Yes Fascia: No Tendon: No Muscle: No Joint: No Bone: No] [N/A:N/A] Epithelialization: [1:None Compression Therapy]  [N/A:N/A N/A] Treatment Notes Wound #1 (Right Lower Leg) 1. Cleanse With Wound Cleanser Soap and water 2. Periwound Care Moisturizing lotion 3. Primary Dressing Applied Collegen AG Hydrogel or K-Y Jelly 4. Secondary Dressing ABD Pad Dry Gauze Drawtex 6. Support Layer Applied 3 layer compression wrap Notes netting. explained to patient how the dressings, orders, frequency of change, and when to return to wound center. patient and caregiver in agreement. Electronic Signature(s) Signed: 07/06/2019 6:05:38 PM By: Linton Ham MD Signed: 07/09/2019 2:17:56 PM By: Kela Millin Entered By: Linton Ham on 07/06/2019 12:39:57 -------------------------------------------------------------------------------- Multi-Disciplinary Care Plan Details Patient Name: Date of Service: ABBOT, BALLOG 07/06/2019 10:30 AM Medical Record [id number] Patient Account Number: [id number] Date of Birth/Sex: Treating RN: 09/09/1934 (84 y.o. [name] Primary Care Kim Lauver: Nicholes Stairs Other Clinician: Referring Rigby Swamy: Treating Shemar Plemmons/Extender:Robson, Vickey Sages, Dorothe Pea in Treatment: 1 Active Inactive Necrotic Tissue Nursing Diagnoses: Impaired tissue integrity related to necrotic/devitalized tissue Goals: Necrotic/devitalized tissue will be minimized in the wound bed Date Initiated: 06/29/2019 Target Resolution Date: 07/27/2019 Goal Status: Active Interventions: Provide education on necrotic tissue and debridement process Notes: Wound/Skin Impairment Nursing Diagnoses: Impaired tissue integrity Knowledge deficit related to smoking impact on wound healing Goals: Ulcer/skin breakdown will have a volume reduction of 30% by week 4 Date Initiated: 06/29/2019 Target Resolution Date: 07/27/2019 Goal Status: Active Interventions: Provide education on ulcer and skin care Notes: Electronic Signature(s) Signed: 07/09/2019 2:17:56 PM By: Kela Millin Entered By: Kela Millin on 07/06/2019 11:46:46 -------------------------------------------------------------------------------- Pain Assessment Details Patient Name: Date of Service: LOGAN, BRANDFORD 07/06/2019 10:30 AM Medical Record [id number] Patient Account Number: [id number] Date of Birth/Sex: Treating RN: May 12, 1935 (84 y.o. [name]) Carlene Coria Primary Care Verlin Duke: Nicholes Stairs Other Clinician: Referring Eevee Borbon: Treating Dquan Cortopassi/Extender:Robson, Vickey Sages, Dorothe Pea in Treatment: 1 Active Problems Location of Pain Severity and Description of Pain Patient Has Paino No Site Locations Pain Management and Medication Current Pain Management: Electronic Signature(s) Signed: 07/24/2019 2:50:47 PM By: Carlene Coria RN Entered By: Carlene Coria on 07/06/2019 10:55:33 -------------------------------------------------------------------------------- Patient/Caregiver Education Details Patient Name: [name] 11/20/2020andnbsp10:30 Date of Service: AM Medical Record [id number] Number: Patient Account Number: [id number] Treating RN: Date of Birth/Gender: January 18, 1935 (84 y.o. [name]) Other Clinician: Primary Care Physician: Avie Echevaria Referring Physician: Physician/Extender: Deliah Boston in Treatment: 1 Education Assessment Education Provided To: Patient Education Topics Provided Wound Debridement: Methods: Explain/Verbal Responses: State content correctly Wound/Skin Impairment: Methods: Explain/Verbal Responses: State content correctly Electronic Signature(s) Signed: 07/09/2019 2:17:56 PM By: Kela Millin Entered By: Kela Millin on 07/06/2019 11:47:09 -------------------------------------------------------------------------------- Wound Assessment Details Patient Name: Date of Service: CORDARRIUS, CORPE 07/06/2019 10:30 AM Medical Record [id number] Patient  Account Number: [id number] Date of Birth/Sex: Treating RN: Feb 13, 1935 (84 y.o. M)  Carlene Coria Primary Care Deigo Alonso: Nicholes Stairs Other Clinician: Referring Sahej Schrieber: Treating Pasha Gadison/Extender:Robson, Vickey Sages, Dorothe Pea in Treatment: 1 Wound Status Wound Number: 1 Primary Abrasion Etiology: Wound Location: Right Lower Leg Wound Open Wounding Event: Trauma Status: Date Acquired: 05/17/2019 Comorbid Chronic Obstructive Pulmonary Disease Weeks Of Treatment: 1 History: (COPD), Hypertension, Neuropathy Clustered Wound: No Photos Wound Measurements Length: (cm) 3 % Reduc Width: (cm) 2.9 % Reduc Depth: (cm) 0.7 Epithel Area: (cm) 6.833 Tunnel Volume: (cm) 4.783 Underm Wound Description Full Thickness Without Exposed Support Foul O Classification: Structures Slough Wound Well defined, not attached Margin: Exudate Medium Amount: Exudate Serosanguineous Type: Exudate red, brown Color: Wound Bed Granulation Amount: Medium (34-66%) Granulation Quality: Pink, Pale Fascia Necrotic Amount: Medium (34-66%) Fat Lay Necrotic Quality: Adherent Slough Tendon Muscle Joint Expos Bone Expose dor After Cleansing: No /Fibrino Yes Exposed Structure Exposed: No er (Subcutaneous Tissue) Exposed: Yes Exposed: No Exposed: No ed: No d: No tion in Area: -8.8% tion in Volume: -26.9% ialization: None ing: No ining: No Electronic Signature(s) Signed: 07/10/2019 7:37:04 AM By: Mikeal Hawthorne EMT/HBOT Signed: 07/24/2019 2:50:47 PM By: Carlene Coria RN Entered By: Mikeal Hawthorne on 07/09/2019 10:36:14 -------------------------------------------------------------------------------- Vitals Details Patient Name: Date of Service: [name] 07/06/2019 10:30 AM Medical Record [id number] Patient Account Number: [id number] Date of Birth/Sex: Treating RN: 01/19/1935 (84 y.o. [name]) Dolores Lory, Culbertson Primary Care Monaca Wadas: Nicholes Stairs Other Clinician: Referring  Catherina Pates: Treating Lulubelle Simcoe/Extender:Robson, Vickey Sages, Dorothe Pea in Treatment: 1 Vital Signs Time Taken: 10:54 Temperature (F): 98.6 Height (in): 68 Pulse (bpm): 81 Weight (lbs): 145 Respiratory Rate (breaths/min): 18 Body Mass Index (BMI): 22 Blood Pressure (mmHg): 152/69 Reference Range: 80 - 120 mg / dl Electronic Signature(s) Signed: 07/24/2019 2:50:47 PM By: Carlene Coria RN Entered By: Carlene Coria on 07/06/2019 10:55:26

## 2019-07-24 NOTE — Progress Notes (Signed)
BURWELL, HOAGE ([id number]) Visit Report for 06/29/2019 Abuse/Suicide Risk Screen Details Patient Name: Date of Service: [name], [name] 06/29/2019 10:30 AM Medical Record [id number] Patient Account Number: [id number] Date of Birth/Sex: Treating RN: [birth year] (84 y.o. [name]) Carlene Coria Primary Care Bianna Haran: Nicholes Stairs Other Clinician: Referring Raelie Lohr: Treating Teisha Trowbridge/Extender:Robson, Vickey Sages, Dorothe Pea in Treatment: 0 Abuse/Suicide Risk Screen Items Answer ABUSE RISK SCREEN: Has anyone close to you tried to hurt or harm you recentlyo No Do you feel uncomfortable with anyone in your familyo No Has anyone forced you do things that you didnt want to doo No Electronic Signature(s) Signed: 07/24/2019 2:49:42 PM By: Carlene Coria RN Entered By: Carlene Coria on 06/29/2019 11:01:22 -------------------------------------------------------------------------------- Activities of Daily Living Details Patient Name: Date of Service: [name], [name] 06/29/2019 10:30 AM Medical Record [id number] Patient Account Number: [id number] Date of Birth/Sex: Treating RN: 06-16-1935 (84 y.o. [name]) Carlene Coria Primary Care Sianni Cloninger: Nicholes Stairs Other Clinician: Referring Teghan Philbin: Treating Nillie Bartolotta/Extender:Robson, Vickey Sages, Dorothe Pea in Treatment: 0 Activities of Daily Living Items Answer Activities of Daily Living (Please select one for each item) Drive Automobile Not Able Take Medications Need Assistance Use Telephone Completely Able Care for Appearance Need Assistance Use Toilet Need Assistance Bath / Shower Need Assistance Dress Self Need Assistance Feed Self Completely Able Walk Need Assistance Get In / Out Bed Need Assistance Housework Not Able Prepare Meals Not Able Handle Money Not Able Shop for Self Not Able Electronic Signature(s) Signed: 07/24/2019 2:49:42 PM By: Carlene Coria RN Entered By: Carlene Coria on 06/29/2019  11:02:42 -------------------------------------------------------------------------------- Education Screening Details Patient Name: Date of Service: [name] 06/29/2019 10:30 AM Medical Record [id number] Patient Account Number: [id number] Date of Birth/Sex: Treating RN: 1934-08-23 (84 y.o. [name]) Carlene Coria Primary Care Cyann Venti: Nicholes Stairs Other Clinician: Referring Chyla Schlender: Treating Camilo Mander/Extender:Robson, Vickey Sages, Dorothe Pea in Treatment: 0 Primary Learner Assessed: Patient Learning Preferences/Education Level/Primary Language Learning Preference: Explanation Highest Education Level: College or Above Preferred Language: English Cognitive Barrier Language Barrier: No Translator Needed: No Memory Deficit: No Emotional Barrier: No Cultural/Religious Beliefs Affecting Medical Care: No Physical Barrier Impaired Vision: Yes Glasses Impaired Hearing: No Decreased Hand dexterity: No Knowledge/Comprehension Knowledge Level: Medium Comprehension Level: Medium Ability to understand written Medium instructions: Ability to understand verbal Medium instructions: Motivation Anxiety Level: Calm Cooperation: Cooperative Education Importance: Acknowledges Need Interest in Health Problems: Asks Questions Perception: Coherent Willingness to Engage in Self- High Management Activities: Electronic Signature(s) Signed: 07/24/2019 2:49:42 PM By: Carlene Coria RN Entered By: Carlene Coria on 06/29/2019 11:04:02 -------------------------------------------------------------------------------- Fall Risk Assessment Details Patient Name: Date of Service: [name] 06/29/2019 10:30 AM Medical Record [id number] Patient Account Number: [id number] Date of Birth/Sex: Treating RN: 02-17-1935 (84 y.o. [name]) Carlene Coria Primary Care Audreanna Torrisi: Nicholes Stairs Other Clinician: Referring Senica Crall: Treating Arelly Whittenberg/Extender:Robson, Vickey Sages, Dorothe Pea in Treatment: 0 Fall Risk Assessment Items Have you had 2 or more falls in the last 12 monthso 0 Yes Have you had any fall that resulted in injury in the last 12 monthso 0 Yes FALLS RISK SCREEN History of falling - immediate or within 3 months 25 Yes Secondary diagnosis (Do you have 2 or more medical diagnoseso) 0 No Ambulatory aid None/bed rest/wheelchair/nurse 0 No Crutches/cane/walker 0 No Furniture 0 No Intravenous therapy Access/Saline/Heparin Lock 0 No Gait/Transferring Normal/ bed rest/ wheelchair 0 No Impaired (short steps with shuffle, may have difficulty arising from chair, 0 No head down, impaired balance) Mental Status Oriented to  own ability 0 No Overestimates or forgets limitations 0 No Risk Level: Medium Risk Score: 25 Electronic Signature(s) Signed: 07/24/2019 2:49:42 PM By: Carlene Coria RN Entered By: Carlene Coria on 06/29/2019 11:07:01 -------------------------------------------------------------------------------- Foot Assessment Details Patient Name: Date of Service: [name], [name] 06/29/2019 10:30 AM Medical Record [id number] Patient Account Number: [id number] Date of Birth/Sex: Treating RN: 10/22/1934 (84 y.o. [name]) Carlene Coria Primary Care Mazey Mantell: Nicholes Stairs Other Clinician: Referring Harmonii Karle: Treating Adriane Guglielmo/Extender:Robson, Vickey Sages, Dorothe Pea in Treatment: 0 Foot Assessment Items Site Locations + = Sensation present, - = Sensation absent, C = Callus, U = Ulcer R = Redness, W = Warmth, M = Maceration, PU = Pre-ulcerative lesion F = Fissure, S = Swelling, D = Dryness Assessment Right: Left: Other Deformity: No No Prior Foot Ulcer: No No Prior Amputation: No No Charcot Joint: No No Ambulatory Status: Ambulatory Without Help Gait: Steady Electronic Signature(s) Signed: 07/24/2019 2:49:42 PM By: Carlene Coria RN Entered By: Carlene Coria on 06/29/2019  11:15:56 -------------------------------------------------------------------------------- Nutrition Risk Screening Details Patient Name: Date of Service: [name], [name] 06/29/2019 10:30 AM Medical Record [id number] Patient Account Number: [id number] Date of Birth/Sex: Treating RN: 07-Apr-1935 (84 y.o. [name]) Carlene Coria Primary Care Loreena Valeri: Nicholes Stairs Other Clinician: Referring Einer Meals: Treating Samuel Mcpeek/Extender:Robson, Vickey Sages, Dorothe Pea in Treatment: 0 Height (in): 68 Weight (lbs): 145 Body Mass Index (BMI): 22 Nutrition Risk Screening Items Score Screening NUTRITION RISK SCREEN: I have an illness or condition that made me change the kind and/or 0 No amount of food I eat I eat fewer than two meals per day 0 No I eat few fruits and vegetables, or milk products 0 No I have three or more drinks of beer, liquor or wine almost every day 0 No I have tooth or mouth problems that make it hard for me to eat 0 No I don't always have enough money to buy the food I need 0 No I eat alone most of the time 0 No I take three or more different prescribed or over-the-counter drugs a day 1 Yes 0 No Without wanting to, I have lost or gained 10 pounds in the last six months I am not always physically able to shop, cook and/or feed myself 2 Yes Nutrition Protocols Good Risk Protocol Provide education on Moderate Risk Protocol 0 nutrition High Risk Proctocol Risk Level: Moderate Risk Score: 3 Electronic Signature(s) Signed: 07/24/2019 2:49:42 PM By: Carlene Coria RN Entered By: Carlene Coria on 06/29/2019 11:07:25

## 2019-07-24 NOTE — Progress Notes (Signed)
[name], [name] ([id number]) Visit Report for 06/29/2019 Allergy List Details Patient Name: Date of Service: [name] 06/29/2019 10:30 AM Medical Record [id number] Patient Account Number: [id number] Date of Birth/Sex: Treating RN: February 27, 1935 (84 y.o. [name]) Carlene Coria Primary Care Kaula Klenke: Nicholes Stairs Other Clinician: Referring Lynea Rollison: Treating Catera Hankins/Extender:Robson, Vickey Sages, Dorothe Pea in Treatment: 0 Allergies Active Allergies Augmentin Neosporin (neo-bac-polym) NSAIDS (Non-Steroidal Anti-Inflammatory Drug) montelukast Tolectin Allergy Notes Electronic Signature(s) Signed: 07/24/2019 2:49:42 PM By: Carlene Coria RN Entered By: Carlene Coria on 06/29/2019 10:55:41 -------------------------------------------------------------------------------- Arrival Information Details Patient Name: Date of Service: [name] 06/29/2019 10:30 AM Medical Record [id number] Patient Account Number: [id number] Date of Birth/Sex: Treating RN: 1935-01-26 (84 y.o. [name]) Carlene Coria Primary Care Kamica Florance: Nicholes Stairs Other Clinician: Referring Rukia Mcgillivray: Treating Cian Costanzo/Extender:Robson, Vickey Sages, Dorothe Pea in Treatment: 0 Visit Information Patient Arrived: Walker Arrival Time: 10:53 Accompanied By: caregiver Transfer Assistance: None Patient Identification Verified: Yes Secondary Verification Process Completed: Yes Patient Requires Transmission-Based No Precautions: Patient Has Alerts: No Electronic Signature(s) Signed: 07/24/2019 2:49:42 PM By: Carlene Coria RN Entered By: Carlene Coria on 06/29/2019 10:54:06 -------------------------------------------------------------------------------- Clinic Level of Care Assessment Details Patient Name: Date of Service: [name] 06/29/2019 10:30 AM Medical Record [id number] Patient Account Number: [id number] Date of Birth/Sex: Treating RN: May 03, 1935 (84 y.o. [name] Primary Care Makari Sanko: Nicholes Stairs Other Clinician: Referring Rajohn Henery: Treating Kierrah Kilbride/Extender:Robson, Vickey Sages, Dorothe Pea in Treatment: 0 Clinic Level of Care Assessment Items TOOL 1 Quantity Score X - Use when EandM and Procedure is performed on INITIAL visit 1 0 ASSESSMENTS - Nursing Assessment / Reassessment X - General Physical Exam (combine w/ comprehensive assessment (listed just below) 1 20 when performed on new pt. evals) X - Comprehensive Assessment (HX, ROS, Risk Assessments, Wounds Hx, etc.) 1 25 ASSESSMENTS - Wound and Skin Assessment / Reassessment []  - Dermatologic / Skin Assessment (not related to wound area) 0 ASSESSMENTS - Ostomy and/or Continence Assessment and Care []  - Incontinence Assessment and Management 0 []  - Ostomy Care Assessment and Management (repouching, etc.) 0 PROCESS - Coordination of Care X - Simple Patient / Family Education for ongoing care 1 15 []  - Complex (extensive) Patient / Family Education for ongoing care 0 X - Staff obtains Programmer, systems, Records, Test Results / Process Orders 1 10 []  - Staff telephones HHA, Nursing Homes / Clarify orders / etc 0 []  - Routine Transfer to another Facility (non-emergent condition) 0 []  - Routine Hospital Admission (non-emergent condition) 0 X - New Admissions / Biomedical engineer / Ordering NPWT, Apligraf, etc. 1 15 []  - Emergency Hospital Admission (emergent condition) 0 PROCESS - Special Needs []  - Pediatric / Minor Patient Management 0 []  - Isolation Patient Management 0 []  - Hearing / Language / Visual special needs 0 []  - Assessment of Community assistance (transportation, D/C planning, etc.) 0 []  - Additional assistance / Altered mentation 0 []  - Support Surface(s) Assessment (bed, cushion, seat, etc.) 0 INTERVENTIONS - Miscellaneous []  - External ear exam 0 []  - Patient Transfer (multiple staff / Civil Service fast streamer / Similar devices) 0 []  - Simple Staple / Suture removal (25  or less) 0 []  - Complex Staple / Suture removal (26 or more) 0 []  - Hypo/Hyperglycemic Management (do not check if billed separately) 0 X - Ankle / Brachial Index (ABI) - do not check if billed separately 1 15 Has the patient been seen at the hospital within the last three years: Yes Total Score: 100 Level  Of Care: New/Established - Level 3 Electronic Signature(s) Signed: 06/29/2019 5:20:59 PM By: Kela Millin Entered By: Kela Millin on 06/29/2019 11:55:41 -------------------------------------------------------------------------------- Encounter Discharge Information Details Patient Name: Date of Service: [name] 06/29/2019 10:30 AM Medical Record [id number] Patient Account Number: [id number] Date of Birth/Sex: Treating RN: Jan 09, 1935 (84 y.o. Lorette Ang, Tammi Klippel Primary Care Mileidy Atkin: Nicholes Stairs Other Clinician: Referring Capitola Ladson: Treating Saryna Kneeland/Extender:Robson, Vickey Sages, Dorothe Pea in Treatment: 0 Encounter Discharge Information Items Post Procedure Vitals Discharge Condition: Stable Temperature (F): 98.1 Ambulatory Status: Walker Pulse (bpm): 88 Discharge Destination: Home Respiratory Rate (breaths/min): 18 Transportation: Private Auto Blood Pressure (mmHg): 148/76 Accompanied By: caregiver Schedule Follow-up Appointment: Yes Clinical Summary of Care: Electronic Signature(s) Signed: 06/29/2019 5:02:36 PM By: Deon Pilling Entered By: Deon Pilling on 06/29/2019 13:09:53 -------------------------------------------------------------------------------- Lower Extremity Assessment Details Patient Name: Date of Service: [name], Roselle 06/29/2019 10:30 AM Medical Record [id number] Patient Account Number: [id number] Date of Birth/Sex: Treating RN: [birth year] (84 y.o. [name]) Carlene Coria Primary Care Emery Dupuy: Nicholes Stairs Other Clinician: Referring Sakiyah Shur: Treating Rishav Rockefeller/Extender:Robson, Vickey Sages, Dorothe Pea in  Treatment: 0 Edema Assessment Assessed: [Left: No] [Right: Yes] Edema: [Left: Ye] [Right: s] Calf Left: Right: Point of Measurement: 39 cm From Medial Instep cm 32 cm Ankle Left: Right: Point of Measurement: 10 cm From Medial Instep cm 22 cm Vascular Assessment Blood Pressure: Brachial: [Right:148] Ankle: [Right:Dorsalis Pedis: 160 1.08] Electronic Signature(s) Signed: 07/24/2019 2:49:42 PM By: Carlene Coria RN Entered By: Carlene Coria on 06/29/2019 11:21:11 -------------------------------------------------------------------------------- Multi Wound Chart Details Patient Name: Date of Service: [name] 06/29/2019 10:30 AM Medical Record [id number] Patient Account Number: [id number] Date of Birth/Sex: Treating RN: Jan 04, 1935 (84 y.o. [name] Primary Care Emaan Gary: Other Clinician: Nicholes Stairs Referring Ettel Albergo: Treating Toni Hoffmeister/Extender:Robson, Vickey Sages, Dorothe Pea in Treatment: 0 Vital Signs Height(in): 68 Pulse(bpm): 69 Weight(lbs): 145 Blood Pressure(mmHg): 148/76 Body Mass Index(BMI): 22 Temperature(F): 98.1 Respiratory 18 Rate(breaths/min): Photos: [1:No Photos] [N/A:N/A] Wound Location: [1:Right Lower Leg] [N/A:N/A] Wounding Event: [1:Trauma] [N/A:N/A] Primary Etiology: [1:Lymphedema] [N/A:N/A] Comorbid History: [1:Chronic Obstructive Pulmonary Disease (COPD), Hypertension, Neuropathy] [N/A:N/A] Date Acquired: [1:05/17/2019] [N/A:N/A] Weeks of Treatment: [1:0] [N/A:N/A] Wound Status: [1:Open] [N/A:N/A] Measurements L x W x D 3.2x2.5x0.6 [N/A:N/A] (cm) Area (cm) : [1:6.283] [N/A:N/A] Volume (cm) : [1:3.77] [N/A:N/A] % Reduction in Area: [1:0.00%] [N/A:N/A] % Reduction in Volume: 0.00% [N/A:N/A] Starting Position 1 8 (o'clock): Ending Position 1 [1:12] (o'clock): Maximum Distance 1 [1:0.7] (cm): Undermining: [1:Yes] [N/A:N/A] Classification: [1:Full Thickness Without Exposed Support Structures]  [N/A:N/A] Exudate Amount: [1:Medium] [N/A:N/A] Exudate Type: [1:Serosanguineous] [N/A:N/A] Exudate Color: [1:red, brown] [N/A:N/A] Wound Margin: [1:Well defined, not attached N/A] Granulation Amount: [1:Small (1-33%)] [N/A:N/A] Granulation Quality: [1:Pink, Pale] [N/A:N/A] Necrotic Amount: [1:Large (67-100%)] [N/A:N/A] Exposed Structures: [1:Fascia: No Fat Layer (Subcutaneous Tissue) Exposed: No Tendon: No Muscle: No Joint: No Bone: No] [N/A:N/A] Epithelialization: [1:None] [N/A:N/A] Debridement: [1:Debridement - Excisional N/A] Pre-procedure [1:11:49] [N/A:N/A] Verification/Time Out Taken: Pain Control: [1:Other] Tissue Debrided: [1:Subcutaneous, Slough] Level: [1:Skin/Subcutaneous Tissue] Debridement Area (sq cm):8 Instrument: [1:Curette] Bleeding: [1:Minimum] Hemostasis Achieved: [1:Pressure] Procedural Pain: [1:2] Post Procedural Pain: [1:0] Debridement Treatment Procedure was tolerated Response: [1:well] Post Debridement [1:3.2x2.5x0.6] Measurements L x W x D (cm) Post Debridement [1:3.77] Volume: (cm) Procedures Performed: Debridement Treatment Notes Electronic Signature(s) Signed: 06/29/2019 5:20:59 PM By: Kela Millin Signed: 06/29/2019 5:33:43 PM By: Linton Ham MD Entered By: Linton Ham on 06/29/2019 12:15:14 -------------------------------------------------------------------------------- Multi-Disciplinary Care Plan Details Patient Name: Date of Service: [name]. 06/29/2019 10:30 AM Medical Record [id number] Patient Account  Number: [id number] Date of Birth/Sex: Treating RN: 10-08-1934 (84 y.o. [name] Primary Care Tressa Maldonado: Nicholes Stairs Other Clinician: Referring Lacorey Brusca: Treating Lunah Losasso/Extender:Robson, Vickey Sages, Dorothe Pea in Treatment: 0 Active Inactive Necrotic Tissue Nursing Diagnoses: Impaired tissue integrity related to necrotic/devitalized tissue Goals: Necrotic/devitalized tissue will  be minimized in the wound bed Date Initiated: 06/29/2019 Target Resolution Date: 07/27/2019 Goal Status: Active Interventions: Provide education on necrotic tissue and debridement process Notes: Wound/Skin Impairment Nursing Diagnoses: Impaired tissue integrity Knowledge deficit related to smoking impact on wound healing Goals: Ulcer/skin breakdown will have a volume reduction of 30% by week 4 Date Initiated: 06/29/2019 Target Resolution Date: 07/27/2019 Goal Status: Active Interventions: Provide education on ulcer and skin care Notes: Electronic Signature(s) Signed: 06/29/2019 5:20:59 PM By: Kela Millin Entered By: Kela Millin on 06/29/2019 11:47:33 -------------------------------------------------------------------------------- Pain Assessment Details Patient Name: Date of Service: Lars, Hartkopf 06/29/2019 10:30 AM Medical Record [id number] Patient Account Number: [id number] Date of Birth/Sex: Treating RN: 1934-12-22 (84 y.o. [name]) Carlene Coria Primary Care Biridiana Twardowski: Nicholes Stairs Other Clinician: Referring Osmara Drummonds: Treating Vahe Pienta/Extender:Robson, Vickey Sages, Dorothe Pea in Treatment: 0 Active Problems Location of Pain Severity and Description of Pain Patient Has Paino No Site Locations Pain Management and Medication Current Pain Management: Electronic Signature(s) Signed: 07/24/2019 2:49:42 PM By: Carlene Coria RN Entered By: Carlene Coria on 06/29/2019 11:25:56 -------------------------------------------------------------------------------- Patient/Caregiver Education Details Patient Name: [name] 11/13/2020andnbsp10:30 Date of Service: AM Medical Record [id number] Number: Patient Account Number: [id number] Treating RN: Date of Birth/Gender: November 12, 1934 (84 y.o. [name]) Other Clinician: Primary Care Physician: Avie Echevaria Referring Physician: Physician/Extender: Deliah Boston in Treatment: 0 Education Assessment Education Provided To: Patient Education Topics Provided Welcome To [facility]: Methods: Explain/Verbal Responses: State content correctly Wound Debridement: Methods: Explain/Verbal Responses: State content correctly Wound/Skin Impairment: Methods: Explain/Verbal Responses: State content correctly Electronic Signature(s) Signed: 06/29/2019 5:20:59 PM By: Kela Millin Entered By: Kela Millin on 06/29/2019 11:47:52 -------------------------------------------------------------------------------- Wound Assessment Details Patient Name: Date of Service: ARVIE, ALSEPT 06/29/2019 10:30 AM Medical Record [id number] Patient Account Number: [id number] Date of Birth/Sex: Treating RN: 1935-07-11 (84 y.o. [name]) Carlene Coria Primary Care Anylah Scheib: Nicholes Stairs Other Clinician: Referring Mailani Degroote: Treating Asja Frommer/Extender:Robson, Vickey Sages, Priscille Heidelberg Weeks in Treatment: 0 Wound Status Wound Number: 1 Primary Abrasion Etiology: Wound Location: Right Lower Leg Wound Open Wounding Event: Trauma Status: Date Acquired: 05/17/2019 Comorbid Chronic Obstructive Pulmonary Disease Weeks Of Treatment: 0 History: (COPD), Hypertension, Neuropathy Clustered Wound: No Photos Wound Measurements Length: (cm) 3.2 Width: (cm) 2.5 Depth: (cm) 0.6 Area: (cm) 6.283 Volume: (cm) 3.77 % Reduction in Area: 0% % Reduction in Volume: 0% Epithelialization: None Tunneling: No Undermining: Yes Starting Position (o'clock): 8 Ending Position (o'clock): 12 Maximum Distance: (cm) 0.7 Wound Description Full Thickness Without Exposed Support Foul Odo Classification: Structures Slough/F Wound Well defined, not attached Margin: Exudate Medium Amount: Exudate Serosanguineous Type: Exudate red, brown Color: Wound Bed Granulation Amount: Small (1-33%) Granulation Quality: Pink, Pale Fascia E Necrotic Amount: Large  (67-100%) Fat Laye Necrotic Quality: Adherent Slough Tendon E Muscle E Joint Ex Bone Exp r After Cleansing: No ibrino Yes Exposed Structure xposed: No r (Subcutaneous Tissue) Exposed: No xposed: No xposed: No posed: No osed: No Electronic Signature(s) Signed: 07/03/2019 4:00:22 PM By: Mikeal Hawthorne EMT/HBOT Signed: 07/24/2019 2:49:42 PM By: Carlene Coria RN Previous Signature: 06/29/2019 5:20:59 PM Version By: Kela Millin Entered By: Mikeal Hawthorne on 07/02/2019 09:09:30 -------------------------------------------------------------------------------- Vitals Details Patient Name:  Date of Service: Walfred, Farson 06/29/2019 10:30 AM Medical Record [id number] Patient Account Number: [id number] Date of Birth/Sex: Treating RN: [birth year] (84 y.o. [name]) Carlene Coria Primary Care Peighton Edgin: Nicholes Stairs Other Clinician: Referring Kelbie Moro: Treating Satori Krabill/Extender:Robson, Vickey Sages, Dorothe Pea in Treatment: 0 Vital Signs Time Taken: 10:54 Temperature (F): 98.1 Height (in): 68 Pulse (bpm): 88 Source: Stated Respiratory Rate (breaths/min): 18 Weight (lbs): 145 Blood Pressure (mmHg): 148/76 Source: Stated Reference Range: 80 - 120 mg / dl Body Mass Index (BMI): 22 Electronic Signature(s) Signed: 07/24/2019 2:49:42 PM By: Carlene Coria RN Entered By: Carlene Coria on 06/29/2019 10:54:45

## 2019-07-25 DIAGNOSIS — D649 Anemia, unspecified: Secondary | ICD-10-CM | POA: Insufficient documentation

## 2019-07-25 NOTE — Assessment & Plan Note (Signed)
Debilitation and anemia contribute to his exertional dyspnea Plan- continue meds, Updated PVax after discussion- his concern

## 2019-07-25 NOTE — Assessment & Plan Note (Signed)
Remains O2 dependent. Currently 4L at rest, 5L with exertion.

## 2019-07-25 NOTE — Progress Notes (Signed)
EBIN, MENNINGER ([id number]) Visit Report for 07/20/2019 HPI Details Patient Name: Date of Service: [name], [name] 07/20/2019 10:15 AM Medical Record [id number] Patient Account Number: [id number] Date of Birth/Sex: Treating RN: 1935-02-01 (84 y.o. [name] Primary Care Provider: Nicholes Stairs Other Clinician: Referring Provider: Treating Provider/Extender:Samella Lucchetti, Vickey Sages, Dorothe Pea in Treatment: 3 History of Present Illness HPI Description: ADMISSION 06/29/2019 This is an 84 year old man who was referred here for review of the wound on the right medial lower leg. This started as trauma after he dropped his oxygen tank on his leg. By his description he had an intact hematoma that lasted for about 2 weeks and then it spontaneously opened on its own. He was seen by his primary doctor and put on antibiotic therapy on 10/21 with cephalexin 500 twice a day. He completed this recently. The patient has been placing Vaseline gauze over the wound area. He is not in any compression. Past medical history; COPD with chronic oxygen therapy, back pain, gait instability, status post left inguinal hernia repair on 02/07/2019, chronic venous insufficiency, chronic atrial fibrillation. He is currently going to outpatient physical therapy ABIs in our clinic were 1.08 on the right 11/20 punched out area on the right medial lower leg. We have debrided this last week applied silver alginate under compression. There is no option for home health as he is going out to outpatient physical therapy. He is reluctant to use home health companies for physical therapy that he feels gets better service at an outpatient rehab facility. Looking at his left leg he clearly has changes of chronic venous insufficiency no doubt this was contributing to the swelling on the right leg last week 12/4; right medial lower leg in the setting of significant chronic venous insufficiency. Wound itself  has some epithelialization coming from the medial aspect. The lateral aspect of the wound this circular wound has raised edges. We are using silver collagen under compression Electronic Signature(s) Signed: 07/24/2019 5:53:37 PM By: Linton Ham MD Signed: 07/24/2019 6:13:04 PM By: Levan Hurst RN, BSN Previous Signature: 07/20/2019 6:02:04 PM Version By: Linton Ham MD Entered By: Levan Hurst on 07/24/2019 07:51:20 -------------------------------------------------------------------------------- Physical Exam Details Patient Name: Date of Service: [name], [name] 07/20/2019 10:15 AM Medical Record [id number] Patient Account Number: [id number] Date of Birth/Sex: Treating RN: [birth year] (84 y.o. [name] Primary Care Provider: Nicholes Stairs Other Clinician: Referring Provider: Treating Provider/Extender:Shihab States, Vickey Sages, Dorothe Pea in Treatment: 3 Constitutional Patient is hypertensive.. Pulse regular and within target range for patient.[name] Respirations regular, non-labored and within target range.. Eyes Conjunctivae clear. No discharge.no icterus. Respiratory Work of breathing is normal oxygen on. Cardiovascular We have good edema control. Integumentary (Hair, Skin) No erythema around the wound. Psychiatric appears at normal baseline. Notes Wound exam; right lower calf. Improved surface area but only by a small amount. Granulation looks healthy except for the superior part of this oval-shaped wound which does not have healthy granulation. Raised edge superiorly. Epithelialization coming from the inferior part of this. Electronic Signature(s) Signed: 07/20/2019 6:02:04 PM By: Linton Ham MD Entered By: Linton Ham on 07/20/2019 12:40:56 -------------------------------------------------------------------------------- Physician Orders Details Patient Name: Date of Service: [name], [name] 07/20/2019 10:15 AM Medical Record  [id number] Patient Account Number: [id number] Date of Birth/Sex: Treating RN: 1935-04-03 (84 y.o. [name] Primary Care Provider: Nicholes Stairs Other Clinician: Referring Provider: Treating Provider/Extender:Onaje Warne, Vickey Sages, Dorothe Pea in Treatment: 3 Verbal / Phone Orders: No Diagnosis Coding ICD-10 Coding  Code Description S80.11XD Contusion of right lower leg, subsequent encounter L97.812 Non-pressure chronic ulcer of other part of right lower leg with fat layer exposed I87.333 Chronic venous hypertension (idiopathic) with ulcer and inflammation of bilateral lower extremity Follow-up Appointments Return Appointment in 1 week. Dressing Change Frequency Do not change entire dressing for one week. Skin Barriers/Peri-Wound Care Moisturizing lotion Wound Cleansing May shower with protection. Primary Wound Dressing Wound #1 Right Lower Leg Silver Collagen - moisten with hydrogel Foam - apply to shin for protection Secondary Dressing Wound #1 Right Lower Leg Dry Gauze ABD pad Drawtex - cut to fit inside wound margins Edema Control 4 layer compression - Right Lower Extremity Avoid standing for long periods of time Elevate legs to the level of the heart or above for 30 minutes daily and/or when sitting, a frequency of: Exercise regularly Electronic Signature(s) Signed: 07/20/2019 6:02:04 PM By: Linton Ham MD Signed: 07/25/2019 12:04:56 PM By: Kela Millin Entered By: Kela Millin on 07/20/2019 11:40:42 -------------------------------------------------------------------------------- Problem List Details Patient Name: Date of Service: [name], [name] 07/20/2019 10:15 AM Medical Record [id number] Patient Account Number: [id number] Date of Birth/Sex: Treating RN: [birth year] (84 y.o. [name] Primary Care Provider: Nicholes Stairs Other Clinician: Referring Provider: Treating Provider/Extender:Fathima Bartl,  Vickey Sages, Dorothe Pea in Treatment: 3 Active Problems ICD-10 Evaluated Encounter Code Description Active Date Today Diagnosis I87.333 Chronic venous hypertension (idiopathic) with ulcer 07/06/2019 No Yes and inflammation of bilateral lower extremity L97.812 Non-pressure chronic ulcer of other part of right lower 06/29/2019 No Yes leg with fat layer exposed S80.11XD Contusion of right lower leg, subsequent encounter 06/29/2019 No Yes Inactive Problems Resolved Problems Electronic Signature(s) Signed: 07/20/2019 6:02:04 PM By: Linton Ham MD Entered By: Linton Ham on 07/20/2019 12:41:26 -------------------------------------------------------------------------------- Progress Note Details Patient Name: Date of Service: [name] 07/20/2019 10:15 AM Medical Record [id number] Patient Account Number: [id number] Date of Birth/Sex: Treating RN: Jan 10, 1935 (84 y.o. [name] Primary Care Provider: Nicholes Stairs Other Clinician: Referring Provider: Treating Provider/Extender:Shirle Provencal, Vickey Sages, Dorothe Pea in Treatment: 3 Subjective History of Present Illness (HPI) ADMISSION 06/29/2019 This is an 84 year old man who was referred here for review of the wound on the right medial lower leg. This started as trauma after he dropped his oxygen tank on his leg. By his description he had an intact hematoma that lasted for about 2 weeks and then it spontaneously opened on its own. He was seen by his primary doctor and put on antibiotic therapy on 10/21 with cephalexin 500 twice a day. He completed this recently. The patient has been placing Vaseline gauze over the wound area. He is not in any compression. Past medical history; COPD with chronic oxygen therapy, back pain, gait instability, status post left inguinal hernia repair on 02/07/2019, chronic venous insufficiency, chronic atrial fibrillation. He is currently going to outpatient physical  therapy ABIs in our clinic were 1.08 on the right 11/20 punched out area on the right medial lower leg. We have debrided this last week applied silver alginate under compression. There is no option for home health as he is going out to outpatient physical therapy. He is reluctant to use home health companies for physical therapy that he feels gets better service at an outpatient rehab facility. Looking at his left leg he clearly has changes of chronic venous insufficiency no doubt this was contributing to the swelling on the right leg last week 12/4; right medial lower leg in the setting of significant chronic venous insufficiency.  Wound itself has some epithelialization coming from the medial aspect. The lateral aspect of the wound this circular wound has raised edges. We are using silver collagen under compression Objective Constitutional Patient is hypertensive.. Pulse regular and within target range for patient.[name] Respirations regular, non-labored and within target range.. Vitals Time Taken: 10:31 AM, Height: 68 in, Source: Stated, Weight: 145 lbs, Source: Stated, BMI: 22, Temperature: 98.3 F, Pulse: 88 bpm, Respiratory Rate: 20 breaths/min, Blood Pressure: 158/69 mmHg. Eyes Conjunctivae clear. No discharge.no icterus. Respiratory Work of breathing is normal oxygen on. Cardiovascular We have good edema control. Psychiatric appears at normal baseline. General Notes: Wound exam; right lower calf. Improved surface area but only by a small amount. Granulation looks healthy except for the superior part of this oval-shaped wound which does not have healthy granulation. Raised edge superiorly. Epithelialization coming from the inferior part of this. Integumentary (Hair, Skin) No erythema around the wound. Wound #1 status is Open. Original cause of wound was Trauma. The wound is located on the Right Lower Leg. The wound measures 2.8cm length x 1.5cm width x 0.2cm depth; 3.299cm^2 area and  0.66cm^3 volume. There is Fat Layer (Subcutaneous Tissue) Exposed exposed. There is no tunneling or undermining noted. There is a medium amount of serosanguineous drainage noted. The wound margin is distinct with the outline attached to the wound base. There is large (67-100%) red, hyper - granulation within the wound bed. There is a small (1-33%) amount of necrotic tissue within the wound bed including Adherent Slough. Assessment Active Problems ICD-10 Chronic venous hypertension (idiopathic) with ulcer and inflammation of bilateral lower extremity Non-pressure chronic ulcer of other part of right lower leg with fat layer exposed Contusion of right lower leg, subsequent encounter Procedures Wound #1 Pre-procedure diagnosis of Wound #1 is an Abrasion located on the Right Lower Leg . There was a Four Layer Compression Therapy Procedure by Kela Millin, RN. Post procedure Diagnosis Wound #1: Same as Pre-Procedure Plan Follow-up Appointments: Return Appointment in 1 week. Dressing Change Frequency: Do not change entire dressing for one week. Skin Barriers/Peri-Wound Care: Moisturizing lotion Wound Cleansing: May shower with protection. Primary Wound Dressing: Wound #1 Right Lower Leg: Silver Collagen - moisten with hydrogel Foam - apply to shin for protection Secondary Dressing: Wound #1 Right Lower Leg: Dry Gauze ABD pad Drawtex - cut to fit inside wound margins Edema Control: 4 layer compression - Right Lower Extremity Avoid standing for long periods of time Elevate legs to the level of the heart or above for 30 minutes daily and/or when sitting, a frequency of: Exercise regularly 1. Silver collagen border foam to continue 2. Apligraf through his insurance. I would like to get this wound closed as soon as possible. Patient with severe chronic respiratory failure certainly would not tolerate infection related to a lower extremity wound either severe cellulitis plus or  minus sepsis syndrome Electronic Signature(s) Signed: 07/24/2019 5:53:37 PM By: Linton Ham MD Signed: 07/24/2019 6:13:04 PM By: Levan Hurst RN, BSN Previous Signature: 07/20/2019 6:02:04 PM Version By: Linton Ham MD Entered By: Levan Hurst on 07/24/2019 07:51:38 -------------------------------------------------------------------------------- [place] Details Patient Name: Date of Service: [name], [name] 07/20/2019 Medical Record [id number] Patient Account Number: [id number] Date of Birth/Sex: Treating RN: 09-Dec-1934 (84 y.o. [name] Primary Care Provider: Nicholes Stairs Other Clinician: Referring Provider: Treating Provider/Extender:Rolla Kedzierski, Vickey Sages, Dorothe Pea in Treatment: 3 Diagnosis Coding ICD-10 Codes Code Description S80.11XD Contusion of right lower leg, subsequent encounter L97.812 Non-pressure chronic ulcer of other part  of right lower leg with fat layer exposed I87.333 Chronic venous hypertension (idiopathic) with ulcer and inflammation of bilateral lower extremity Facility Procedures CPT4 Code Description: [id number] (Facility Use Only) [phone] - APPLY MULTLAY COMPRS LWR RT LEG Modifier: Quantity: 1 Physician Procedures CPT4: Description Modifier Quantity Code [id number] - WC PHYS LEVEL 3 - EST PT 1 ICD-10 Diagnosis Description I87.333 Chronic venous hypertension (idiopathic) with ulcer and inflammation of bilateral lower extremity L97.812 Non-pressure chronic ulcer  of other part of right lower leg with fat layer exposed S80.11XD Contusion of right lower leg, subsequent encounter Electronic Signature(s) Signed: 07/20/2019 6:02:04 PM By: Linton Ham MD Entered By: Linton Ham on 07/20/2019 12:42:46

## 2019-07-25 NOTE — Assessment & Plan Note (Signed)
Suspect some loss and chronic disease contribute. Discussed how anemia amplifies dyspnea.

## 2019-07-26 ENCOUNTER — Ambulatory Visit: Payer: Medicare Other | Admitting: Physical Therapy

## 2019-07-26 ENCOUNTER — Encounter: Payer: Self-pay | Admitting: Physical Therapy

## 2019-07-26 ENCOUNTER — Other Ambulatory Visit: Payer: Self-pay

## 2019-07-26 DIAGNOSIS — M545 Low back pain, unspecified: Secondary | ICD-10-CM

## 2019-07-26 DIAGNOSIS — Z8679 Personal history of other diseases of the circulatory system: Secondary | ICD-10-CM | POA: Diagnosis not present

## 2019-07-26 DIAGNOSIS — D649 Anemia, unspecified: Secondary | ICD-10-CM | POA: Diagnosis not present

## 2019-07-26 DIAGNOSIS — I10 Essential (primary) hypertension: Secondary | ICD-10-CM | POA: Diagnosis not present

## 2019-07-26 DIAGNOSIS — M4854XA Collapsed vertebra, not elsewhere classified, thoracic region, initial encounter for fracture: Secondary | ICD-10-CM | POA: Diagnosis not present

## 2019-07-26 DIAGNOSIS — M6281 Muscle weakness (generalized): Secondary | ICD-10-CM

## 2019-07-26 DIAGNOSIS — E7800 Pure hypercholesterolemia, unspecified: Secondary | ICD-10-CM | POA: Diagnosis not present

## 2019-07-26 DIAGNOSIS — J449 Chronic obstructive pulmonary disease, unspecified: Secondary | ICD-10-CM | POA: Diagnosis not present

## 2019-07-26 DIAGNOSIS — R262 Difficulty in walking, not elsewhere classified: Secondary | ICD-10-CM

## 2019-07-26 DIAGNOSIS — E778 Other disorders of glycoprotein metabolism: Secondary | ICD-10-CM | POA: Diagnosis not present

## 2019-07-26 NOTE — Therapy (Addendum)
[facility] [address] [place], [place], [address] Phone: [phone]   Fax:  [phone]  Physical Therapy Treatment/Discharge Summary   Patient Details  Name: [name] MRN: [id number] Date of Birth: [birth year] Referring Provider (PT): Dr. Newman Pies   Encounter Date: 07/26/2019  PT End of Session - 07/26/19 1157    Visit Number  12    Date for PT Re-Evaluation  07/31/19    Authorization Type  Medicare 10th visit prog note:  KX at visit 15    PT Start Time  1112    PT Stop Time  1150    PT Time Calculation (min)  38 min       Past Medical History:  Diagnosis Date  . Allergic rhinitis, cause unspecified   . Arthritis   . COPD (chronic obstructive pulmonary disease) ([place])   . Dyspnea   . Dysrhythmia    "1 episode of tachycardia in 1980's, been on it ever since"  . Emphysema of lung ([place])   . Hypertension   . Left inguinal hernia 02/07/2019  . Neuromuscular disorder ([place])   . Neuropathy    feet  . Pneumonia    History of, last Oct 2019  . Pulmonary nodule     Past Surgical History:  Procedure Laterality Date  . APPENDECTOMY    . CARDIAC CATHETERIZATION  2003   performed at [facility], Dr. Fransico Him  . CATARACT EXTRACTION, BILATERAL    . EYE SURGERY    . INGUINAL HERNIA REPAIR Left 02/07/2019   Procedure: OPEN REPAIR LEFT INGUINAL HERNIA WITH MESH;  Surgeon: Fanny Skates, MD;  Location: [facility];  Service: General;  Laterality: Left;  SPINAL AND TAP BLOCK ANESTHESIA  . ROTATOR CUFF REPAIR    . TONSILLECTOMY    . VASECTOMY      There were no vitals filed for this visit.  Subjective Assessment - 07/26/19 1113    Subjective  I've done some of the exercises at home but not every day.  I have some back pain with walking.    Pertinent History  wife can't help me, she has her own problems;  O2 full time;  RW full time;  has a home stair lift;  had pulmonary rehab 1 year ago;  he would like to  get stronger to do pulmonary rehab after PT;  wearing brace but pt states the doctor said it was up to me whether to wear it or not    Currently in Pain?  Yes    Pain Score  2     Pain Location  Back                       [facility] Adult PT Treatment/Exercise - 07/26/19 0001      Ambulation/Gait   Gait Comments  160 feet with SPC, therapist carrying 02;  patient carry O2 80 feet with SPC      Self-Care   Posture  review of lumbar roll with sitting; frequent change of position       Therapeutic Activites    ADL's  walking, standing, curbs/steps, getting in/out of the car      Lumbar Exercises: Aerobic   Nustep  1 minute        Lumbar Exercises: Seated   Long Arc Quad on Chair  AROM;Right;Left;10 reps    Other Seated Lumbar Exercises  red band rows 20x    Other Seated Lumbar Exercises  red band extensions 10x       Knee/Hip Exercises: Standing   Heel Raises  Both;10 reps    Hip Abduction  AROM;Right;Left;10 reps    Hip Extension  AROM;Right;Left;10 reps    Other Standing Knee Exercises  stap taps 10x                PT Short Term Goals - 06/26/19 1214      PT SHORT TERM GOAL #1   Title  independent with initial HEP    Status  Achieved      PT SHORT TERM GOAL #2   Title  The patient will be able to ambulate 240 feet with pain level 4/10    Time  4    Period  Weeks    Status  On-going      PT SHORT TERM GOAL #3   Title  Timed up and Go speed  improved to 17.5 sec or less    Time  4    Period  Weeks    Status  On-going      PT SHORT TERM GOAL #4   Title  BERG test improved from 42/56 to 45/56    Status  Achieved        PT Long Term Goals - 06/05/19 2018      PT LONG TERM GOAL #1   Title  be independent in advanced HEP    Time  8    Period  Weeks    Status  New    Target Date  07/31/19      PT LONG TERM GOAL #2   Title  BERG balance test improved to 47/56 indicating improved gait safety and decreased risk of falls    Time  8    Period   Weeks    Status  New      PT LONG TERM GOAL #3   Title  The patient will be able to walk for 5 minutes with pain level 4/10 or less    Time  8    Period  Weeks    Status  New      PT LONG TERM GOAL #4   Title  Timed up and go speed improved to 16.5 sec or less    Time  8    Period  Weeks    Status  New      PT LONG TERM GOAL #5   Title  Activities Based Specific Scale (ABC) will indicate decreased fearfulness of falling from initial assessment    Time  Orient - 07/26/19 1951    Clinical Impression Statement  The patient is able to ambulate with single point cane for a longer period of time today than on previous sessions before requiring a sitting rest break for shortness of breath.  He is able to ambulate 80 feet with SPC while carrying his own O2 tank without loss of balance.  He is also able to perform several standing ex's in a row to review his HEP.  He continues to be concerned about the increased Covid numbers in the community and discussed continuing his HEP independently.  Will follow up in 1 week, then anticipate he will request discharge from PT.   Therapist closely monitoring respiratory status and providing close supervision for safety with ambulation.    Examination-Activity Limitations  Bend;Carry;Stairs;Lift;Stand;Locomotion Level;Hygiene/Grooming;Dressing  Rehab Potential  Good    PT Frequency  2x / week    PT Duration  8 weeks    PT Treatment/Interventions  ADLs/Self Care Home Management;Gait training;Functional mobility training;Therapeutic activities;Therapeutic exercise;Balance training;Neuromuscular re-education;Manual techniques;Patient/family education;Taping;Dry needling    PT Next Visit Plan  probable discharge next visit;  check 5x sit to stand test;  BERG; ABC, TUG;   patient needs assistance getting walker, cane and O2 getting in/out of the car;  gait with  QC;   Nu-Step 1 minute increments with rest breaks;   short bouts of seated and standing ex with monitoring of O2 sats; balance ex    PT Home Exercise Plan  Access Code: [email or web address]       Patient will benefit from skilled therapeutic intervention in order to improve the following deficits and impairments:  Difficulty walking, Cardiopulmonary status limiting activity, Decreased endurance, Decreased activity tolerance, Impaired perceived functional ability, Pain, Decreased balance, Decreased strength  Visit Diagnosis: Acute midline low back pain without sciatica  Muscle weakness (generalized)  Difficulty in walking, not elsewhere classified    PHYSICAL THERAPY DISCHARGE SUMMARY  Visits from Start of Care: 12  Current functional level related to goals / functional outcomes: The patient called to cancel his last scheduled appt.  As we discussed previously, he is concerned with the rising Covid numbers in the community.  He also has tremendous difficulty exercising in the clinic in a mask given his COPD and portable O2 dependency.  He has been instructed in an exercise program that he can do at home for general strengthening.  He will be more successful in doing ex at home without the worry of running out of O2 on his portable tank and not having to wear a mask.  Will discharge from PT at this time with partial goals met.  He plans to return to Pulmonary Rehab eventually/post pandemic.     Remaining deficits: As above   Education / Equipment: HEP  Plan: Patient agrees to discharge.  Patient goals were partially met. Patient is being discharged due to the patient's request.  ?????         Problem List Patient Active Problem List   Diagnosis Date Noted  . Anemia, unspecified 07/25/2019  . Left inguinal hernia 02/07/2019  . Left leg pain 05/18/2018  . Pneumonia 05/18/2018  . Chronic respiratory failure with hypoxia ([place]) 01/12/2015  . COPD with acute exacerbation ([place]) 01/21/2013  . Hyperlipidemia 12/01/2011  . Hypertension  12/01/2011  . CAD (coronary artery disease) 05/23/2011  . Lung nodule 09/16/2008  . ALLERGIC RHINITIS 07/24/2007  . COPD mixed type ([place]) 07/24/2007  . BURSITIS 07/24/2007   Ruben Im, PT 07/26/19 8:25 PM Phone: [phone] Fax: [phone] Alvera Singh 07/26/2019, 8:23 PM  [facility] [address] [place], [place], [address] Phone: [phone]   Fax:  [phone]  Name: OLANDO WILLEMS MRN: [id number] Date of Birth: 1934-11-16

## 2019-07-27 ENCOUNTER — Encounter (HOSPITAL_BASED_OUTPATIENT_CLINIC_OR_DEPARTMENT_OTHER): Payer: Medicare Other | Admitting: Internal Medicine

## 2019-07-27 DIAGNOSIS — I87331 Chronic venous hypertension (idiopathic) with ulcer and inflammation of right lower extremity: Secondary | ICD-10-CM | POA: Diagnosis not present

## 2019-07-27 DIAGNOSIS — I872 Venous insufficiency (chronic) (peripheral): Secondary | ICD-10-CM | POA: Diagnosis not present

## 2019-07-27 DIAGNOSIS — S81801A Unspecified open wound, right lower leg, initial encounter: Secondary | ICD-10-CM | POA: Diagnosis not present

## 2019-07-30 NOTE — Progress Notes (Signed)
[name], [name] ([id number]) Visit Report for 07/27/2019 Arrival Information Details Patient Name: Date of Service: [name] 07/27/2019 10:30 AM Medical Record [id number] Patient Account Number: [id number] Date of Birth/Sex: Treating RN: 1935/04/21 (84 y.o. Jerilynn [name]) Carlene Coria Primary Care Kendry Pfarr: Nicholes Stairs Other Clinician: Referring Princess Karnes: Treating Karmela Bram/Extender:Robson, Vickey Sages, Dorothe Pea in Treatment: 4 Visit Information History Since Last Visit All ordered tests and consults were completed: No Patient Arrived: Gilford Rile Added or deleted any medications: No Arrival Time: 10:24 Any new allergies or adverse reactions: No Accompanied By: self Had a fall or experienced change in No Transfer Assistance: Manual activities of daily living that may affect Patient Identification Verified: Yes risk of falls: Secondary Verification Process Completed: Yes Signs or symptoms of abuse/neglect since last No Patient Requires Transmission-Based No visito Precautions: Hospitalized since last visit: No Patient Has Alerts: No Implantable device outside of the clinic excluding No cellular tissue based products placed in the center since last visit: Has Dressing in Place as Prescribed: Yes Has Compression in Place as Prescribed: Yes Pain Present Now: No Electronic Signature(s) Signed: 07/27/2019 5:42:46 PM By: Carlene Coria RN Entered By: Carlene Coria on 07/27/2019 10:28:51 -------------------------------------------------------------------------------- Compression Therapy Details Patient Name: Date of Service: JAIDEEP, RUDY 07/27/2019 10:30 AM Medical Record [id number] Patient Account Number: [id number] Date of Birth/Sex: Treating RN: 04/20/1935 (84 y.o. [name] Primary Care Tarquin Welcher: Nicholes Stairs Other Clinician: Referring Johngabriel Verde: Treating Khriz Liddy/Extender:Robson, Vickey Sages, Dorothe Pea in Treatment:  4 Compression Therapy Performed for Wound Wound #1 Right Lower Leg Assessment: Performed By: Clinician Kela Millin, RN Compression Type: Four Layer Post Procedure Diagnosis Same as Pre-procedure Electronic Signature(s) Signed: 07/30/2019 5:56:42 PM By: Kela Millin Entered By: Kela Millin on 07/27/2019 10:54:59 -------------------------------------------------------------------------------- Encounter Discharge Information Details Patient Name: Date of Service: [name] 07/27/2019 10:30 AM Medical Record [id number] Patient Account Number: [id number] Date of Birth/Sex: Treating RN: 04-06-1935 (84 y.o. Lorette Ang, Tammi Klippel Primary Care Zilpha Mcandrew: Nicholes Stairs Other Clinician: Referring Heath Badon: Treating Renner Sebald/Extender:Robson, Vickey Sages, Dorothe Pea in Treatment: 4 Encounter Discharge Information Items Discharge Condition: Stable Ambulatory Status: Walker Discharge Destination: Home Transportation: Private Auto Accompanied By: caregiver Schedule Follow-up Appointment: Yes Clinical Summary of Care: Electronic Signature(s) Signed: 07/27/2019 6:25:32 PM By: Deon Pilling Entered By: Deon Pilling on 07/27/2019 11:12:01 -------------------------------------------------------------------------------- Lower Extremity Assessment Details Patient Name: Date of Service: [name], [name] 07/27/2019 10:30 AM Medical Record [id number] Patient Account Number: [id number] Date of Birth/Sex: Treating RN: 06-22-1935 (84 y.o. Jerilynn [name]) Carlene Coria Primary Care Natane Heward: Nicholes Stairs Other Clinician: Referring Alexsandria Kivett: Treating Nayan Proch/Extender:Robson, Vickey Sages, Dorothe Pea in Treatment: 4 Edema Assessment Assessed: [Left: No] [Right: No] Edema: [Left: Ye] [Right: s] Calf Left: Right: Point of Measurement: 39 cm From Medial Instep cm 28 cm Ankle Left: Right: Point of Measurement: 10 cm From Medial Instep cm 20 cm Electronic  Signature(s) Signed: 07/27/2019 5:42:46 PM By: Carlene Coria RN Entered By: Carlene Coria on 07/27/2019 10:38:28 -------------------------------------------------------------------------------- Multi Wound Chart Details Patient Name: Date of Service: [name] 07/27/2019 10:30 AM Medical Record [id number] Patient Account Number: [id number] Date of Birth/Sex: Treating RN: 1935-03-26 (84 y.o. M) Primary Care [name]: Nicholes Stairs Other Clinician: Referring Eddith Mentor: Treating Ibraham Levi/Extender:Robson, Vickey Sages, Dorothe Pea in Treatment: 4 Vital Signs Height(in): 68 Pulse(bpm): 88 Weight(lbs): 145 Blood Pressure(mmHg): 160/79 Body Mass Index(BMI): 22 Temperature(F): 97.9 Respiratory 18 Rate(breaths/min): Photos: [1:No Photos] [N/A:N/A] Wound Location: [1:Right Lower Leg] [N/A:N/A] Wounding Event: [1:Trauma] [N/A:N/A] Primary Etiology: [1:Abrasion] [N/A:N/A] Comorbid  History: [1:Chronic Obstructive Pulmonary Disease (COPD), Hypertension, Neuropathy] [N/A:N/A] Date Acquired: [1:05/17/2019] [N/A:N/A] Weeks of Treatment: [1:4] [N/A:N/A] Wound Status: [1:Open] [N/A:N/A] Measurements L x W x D 2.5x1.2x0.2 [N/A:N/A] (cm) Area (cm) : [1:2.356] [N/A:N/A] Volume (cm) : [1:0.471] [N/A:N/A] % Reduction in Area: [1:62.50%] [N/A:N/A] % Reduction in Volume: 87.50% [N/A:N/A] Classification: [1:Full Thickness Without Exposed Support Structures] [N/A:N/A] Exudate Amount: [1:Medium] [N/A:N/A] Exudate Type: [1:Serosanguineous] [N/A:N/A] Exudate Color: [1:red, brown] [N/A:N/A] Wound Margin: [1:Distinct, outline attached] [N/A:N/A] Granulation Amount: [1:Large (67-100%)] [N/A:N/A] Granulation Quality: [1:Red, Hyper-granulation] [N/A:N/A] Necrotic Amount: [1:Small (1-33%)] [N/A:N/A] Exposed Structures: [1:Fat Layer (Subcutaneous Tissue) Exposed: Yes Fascia: No Tendon: No Muscle: No Joint: No Bone: No] [N/A:N/A] Epithelialization: [1:Small (1-33%) Compression Therapy]  [N/A:N/A N/A] Treatment Notes Electronic Signature(s) Signed: 07/27/2019 6:34:01 PM By: Linton Ham MD Entered By: Linton Ham on 07/27/2019 10:56:33 -------------------------------------------------------------------------------- Multi-Disciplinary Care Plan Details Patient Name: Date of Service: [name] 07/27/2019 10:30 AM Medical Record [id number] Patient Account Number: [id number] Date of Birth/Sex: Treating RN: [birth year] (84 y.o. [name] Primary Care Shelsey Rieth: Nicholes Stairs Other Clinician: Referring Manveer Gomes: Treating Jemia Fata/Extender:Robson, Vickey Sages, Dorothe Pea in Treatment: 4 Active Inactive Necrotic Tissue Nursing Diagnoses: Impaired tissue integrity related to necrotic/devitalized tissue Goals: Necrotic/devitalized tissue will be minimized in the wound bed Date Initiated: 06/29/2019 Target Resolution Date: 08/24/2019 Goal Status: Active Interventions: Provide education on necrotic tissue and debridement process Notes: Wound/Skin Impairment Nursing Diagnoses: Impaired tissue integrity Knowledge deficit related to smoking impact on wound healing Goals: Ulcer/skin breakdown will have a volume reduction of 30% by week 4 Date Initiated: 06/29/2019 Target Resolution Date: 08/24/2019 Goal Status: Active Interventions: Provide education on ulcer and skin care Notes: Electronic Signature(s) Signed: 07/30/2019 5:56:42 PM By: Kela Millin Entered By: Kela Millin on 07/27/2019 10:53:35 -------------------------------------------------------------------------------- Pain Assessment Details Patient Name: Date of Service: KADE, MAZZIOTTI 07/27/2019 10:30 AM Medical Record [id number] Patient Account Number: [id number] Date of Birth/Sex: Treating RN: 1934/09/28 (84 y.o. Jerilynn [name]) Carlene Coria Primary Care Daralyn Bert: Nicholes Stairs Other Clinician: Referring Tarisa Paola: Treating Aicha Clingenpeel/Extender:Robson,  Vickey Sages, Dorothe Pea in Treatment: 4 Active Problems Location of Pain Severity and Description of Pain Patient Has Paino No Site Locations Pain Management and Medication Current Pain Management: Electronic Signature(s) Signed: 07/27/2019 5:42:46 PM By: Carlene Coria RN Entered By: Carlene Coria on 07/27/2019 10:30:11 -------------------------------------------------------------------------------- Patient/Caregiver Education Details Patient Name: [name] 12/11/2020andnbsp10:30 Date of Service: AM Medical Record [id number] Number: Patient Account Number: [id number] Treating RN: Date of Birth/Gender: 02-12-1935 (84 y.o. [name]) Other Clinician: Primary Care Physician: Avie Echevaria Referring Physician: Physician/Extender: Deliah Boston in Treatment: 4 Education Assessment Education Provided To: Patient Education Topics Provided Wound Debridement: Methods: Explain/Verbal Responses: State content correctly Wound/Skin Impairment: Methods: Explain/Verbal Responses: State content correctly Electronic Signature(s) Signed: 07/30/2019 5:56:42 PM By: Kela Millin Entered By: Kela Millin on 07/27/2019 10:53:52 -------------------------------------------------------------------------------- Wound Assessment Details Patient Name: Date of Service: [name] 07/27/2019 10:30 AM Medical Record [id number] Patient Account Number: [id number] Date of Birth/Sex: Treating RN: [birth year] (84 y.o. Jerilynn [name]) Carlene Coria Primary Care Rosabelle Jupin: Nicholes Stairs Other Clinician: Referring Geronimo Diliberto: Treating Shar Paez/Extender:Robson, Vickey Sages, Dorothe Pea in Treatment: 4 Wound Status Wound Number: 1 Primary Abrasion Etiology: Wound Location: Right Lower Leg Wound Open Wounding Event: Trauma Status: Date Acquired: 05/17/2019 Comorbid Chronic Obstructive Pulmonary Disease Weeks Of Treatment:  4 History: (COPD), Hypertension, Neuropathy Clustered Wound: No Photos Wound Measurements Length: (cm) 2.5 % Reduct Width: (cm) 1.2 % Reduct Depth: (cm) 0.2 Epitheli  Area: (cm) 2.356 Tunneli Volume: (cm) 0.471 Undermi Wound Description Classification: Full Thickness Without Exposed Support Foul Odo Structures Slough/F Wound Distinct, outline attached Margin: Exudate Medium Amount: Exudate Serosanguineous Type: Exudate red, brown Color: Wound Bed Granulation Amount: Large (67-100%) Granulation Quality: Red, Hyper-granulation Fascia E Necrotic Amount: Small (1-33%) Fat Laye Necrotic Quality: Adherent Slough Tendon E Muscle E Joint Ex Bone Exp r After Cleansing: No ibrino Yes Exposed Structure xposed: No r (Subcutaneous Tissue) Exposed: Yes xposed: No xposed: No posed: No osed: No ion in Area: 62.5% ion in Volume: 87.5% alization: Small (1-33%) ng: No ning: No Treatment Notes Wound #1 (Right Lower Leg) 1. Cleanse With Wound Cleanser Soap and water 2. Periwound Care Moisturizing lotion 3. Primary Dressing Applied Collegen AG Hydrogel or K-Y Jelly 4. Secondary Dressing ABD Pad Dry Gauze Drawtex 6. Support Layer Applied 4 layer compression wrap Notes netting. foam to shin for protection. Electronic Signature(s) Signed: 07/30/2019 3:44:56 PM By: Mikeal Hawthorne EMT/HBOT Signed: 07/30/2019 5:55:53 PM By: Carlene Coria RN Previous Signature: 07/27/2019 5:42:46 PM Version By: Carlene Coria RN Entered By: Mikeal Hawthorne on 07/30/2019 13:49:14 -------------------------------------------------------------------------------- Vitals Details Patient Name: Date of Service: [name] 07/27/2019 10:30 AM Medical Record [id number] Patient Account Number: [id number] Date of Birth/Sex: Treating RN: 01-30-1935 (84 y.o. Jerilynn [name]) Dolores Lory, [place] Primary Care Varetta Chavers: Nicholes Stairs Other Clinician: Referring Tiger Spieker: Treating Rebeca Valdivia/Extender:Robson,  Vickey Sages, Dorothe Pea in Treatment: 4 Vital Signs Time Taken: 10:29 Temperature (F): 97.9 Height (in): 68 Pulse (bpm): 88 Weight (lbs): 145 Respiratory Rate (breaths/min): 18 Body Mass Index (BMI): 22 Blood Pressure (mmHg): 160/79 Reference Range: 80 - 120 mg / dl Electronic Signature(s) Signed: 07/27/2019 5:42:46 PM By: Carlene Coria RN Entered By: Carlene Coria on 07/27/2019 10:30:05

## 2019-07-30 NOTE — Progress Notes (Signed)
KARIM, MCCOIG ([id number]) Visit Report for 07/27/2019 HPI Details Patient Name: Date of Service: JOH, DROGE 07/27/2019 10:30 AM Medical Record [id number] Patient Account Number: [id number] Date of Birth/Sex: Treating RN: [birth year] (84 y.o. M) Primary Care Provider: Nicholes Stairs Other Clinician: Referring Provider: Treating Provider/Extender:Kensington Duerst, Vickey Sages, Dorothe Pea in Treatment: 4 History of Present Illness HPI Description: ADMISSION 06/29/2019 This is an 84 year old man who was referred here for review of the wound on the right medial lower leg. This started as trauma after he dropped his oxygen tank on his leg. By his description he had an intact hematoma that lasted for about 2 weeks and then it spontaneously opened on its own. He was seen by his primary doctor and put on antibiotic therapy on 10/21 with cephalexin 500 twice a day. He completed this recently. The patient has been placing Vaseline gauze over the wound area. He is not in any compression. Past medical history; COPD with chronic oxygen therapy, back pain, gait instability, status post left inguinal hernia repair on 02/07/2019, chronic venous insufficiency, chronic atrial fibrillation. He is currently going to outpatient physical therapy ABIs in our clinic were 1.08 on the right 11/20 punched out area on the right medial lower leg. We have debrided this last week applied silver alginate under compression. There is no option for home health as he is going out to outpatient physical therapy. He is reluctant to use home health companies for physical therapy that he feels gets better service at an outpatient rehab facility. Looking at his left leg he clearly has changes of chronic venous insufficiency no doubt this was contributing to the swelling on the right leg last week 12/4; right medial lower leg in the setting of significant chronic venous insufficiency. Wound itself has  some epithelialization coming from the medial aspect. The lateral aspect of the wound this circular wound has raised edges. We are using silver collagen under compression 12/11; right medial lower leg in the setting of significant chronic venous insufficiency. In general slightly smaller. He is approved for Apligraf without a co-pay and we will order this for next week. I am trying to get this to close as fast as possible. Electronic Signature(s) Signed: 07/27/2019 6:34:01 PM By: Linton Ham MD Entered By: Linton Ham on 07/27/2019 10:57:13 -------------------------------------------------------------------------------- Physical Exam Details Patient Name: Date of Service: MALKIEL, FLAGG 07/27/2019 10:30 AM Medical Record [id number] Patient Account Number: [id number] Date of Birth/Sex: Treating RN: Dec 05, 1934 (84 y.o. M) Primary Care Provider: Nicholes Stairs Other Clinician: Referring Provider: Treating Provider/Extender:Larenda Reedy, Vickey Sages, Dorothe Pea in Treatment: 4 Constitutional Patient is hypertensive.. Pulse regular and within target range for patient.[name] Respirations regular, non-labored and within target range.. Temperature is normal and within the target range for the patient.[name] Appears in no distress. Eyes Conjunctivae clear. No discharge.no icterus. Respiratory Respiratory rate is normal. O2 1. Cardiovascular Pedal pulses are palpable. Integumentary (Hair, Skin) Changes of chronic venous insufficiency. Psychiatric appears at normal baseline. Notes Wound exam; right lower calf. Improved surface area but only by a small amount. Granulation looks generally quite good. Small rim of epithelialization. No evidence of surrounding infection Electronic Signature(s) Signed: 07/27/2019 6:34:01 PM By: Linton Ham MD Entered By: Linton Ham on 07/27/2019  10:59:27 -------------------------------------------------------------------------------- Physician Orders Details Patient Name: Date of Service: [name] 07/27/2019 10:30 AM Medical Record [id number] Patient Account Number: [id number] Date of Birth/Sex: Treating RN: 01/31/1935 (84 y.o. [name] Primary Care Provider: Nicholes Stairs Other Clinician: Referring  Provider: Treating Provider/Extender:Anacristina Steffek, Vickey Sages, Dorothe Pea in Treatment: 4 Verbal / Phone Orders: No Diagnosis Coding ICD-10 Coding Code Description [phone] Chronic venous hypertension (idiopathic) with ulcer and inflammation of bilateral lower extremity L97.812 Non-pressure chronic ulcer of other part of right lower leg with fat layer exposed S80.11XD Contusion of right lower leg, subsequent encounter Follow-up Appointments Return Appointment in 1 week. Dressing Change Frequency Do not change entire dressing for one week. Skin Barriers/Peri-Wound Care Moisturizing lotion Wound Cleansing May shower with protection. Primary Wound Dressing Wound #1 Right Lower Leg Silver Collagen - moisten with hydrogel Foam - apply to shin for protection Secondary Dressing Wound #1 Right Lower Leg Dry Gauze ABD pad Drawtex - cut to fit inside wound margins Edema Control 4 layer compression - Right Lower Extremity Avoid standing for long periods of time Elevate legs to the level of the heart or above for 30 minutes daily and/or when sitting, a frequency of: Exercise regularly Electronic Signature(s) Signed: 07/27/2019 6:34:01 PM By: Linton Ham MD Signed: 07/30/2019 5:56:42 PM By: Kela Millin Entered By: Kela Millin on 07/27/2019 10:51:01 -------------------------------------------------------------------------------- Problem List Details Patient Name: Date of Service: [name] 07/27/2019 10:30 AM Medical Record [id number] Patient Account Number:  [id number] Date of Birth/Sex: Treating RN: 02-05-1935 (84 y.o. M) Primary Care Provider: Nicholes Stairs Other Clinician: Referring Provider: Treating Provider/Extender:Terriyah Westra, Vickey Sages, Dorothe Pea in Treatment: 4 Active Problems ICD-10 Evaluated Encounter Code Description Active Date Today Diagnosis I87.333 Chronic venous hypertension (idiopathic) with ulcer 07/06/2019 No Yes and inflammation of bilateral lower extremity L97.812 Non-pressure chronic ulcer of other part of right lower 06/29/2019 No Yes leg with fat layer exposed S80.11XD Contusion of right lower leg, subsequent encounter 06/29/2019 No Yes Inactive Problems Resolved Problems Electronic Signature(s) Signed: 07/27/2019 6:34:01 PM By: Linton Ham MD Entered By: Linton Ham on 07/27/2019 10:56:25 -------------------------------------------------------------------------------- Progress Note Details Patient Name: Date of Service: [name] 07/27/2019 10:30 AM Medical Record [id number] Patient Account Number: [id number] Date of Birth/Sex: Treating RN: [birth year] (84 y.o. M) Primary Care Provider: Nicholes Stairs Other Clinician: Referring Provider: Treating Provider/Extender:Devonia Farro, Vickey Sages, Dorothe Pea in Treatment: 4 Subjective History of Present Illness (HPI) ADMISSION 06/29/2019 This is an 84 year old man who was referred here for review of the wound on the right medial lower leg. This started as trauma after he dropped his oxygen tank on his leg. By his description he had an intact hematoma that lasted for about 2 weeks and then it spontaneously opened on its own. He was seen by his primary doctor and put on antibiotic therapy on 10/21 with cephalexin 500 twice a day. He completed this recently. The patient has been placing Vaseline gauze over the wound area. He is not in any compression. Past medical history; COPD with chronic oxygen therapy, back pain, gait  instability, status post left inguinal hernia repair on 02/07/2019, chronic venous insufficiency, chronic atrial fibrillation. He is currently going to outpatient physical therapy ABIs in our clinic were 1.08 on the right 11/20 punched out area on the right medial lower leg. We have debrided this last week applied silver alginate under compression. There is no option for home health as he is going out to outpatient physical therapy. He is reluctant to use home health companies for physical therapy that he feels gets better service at an outpatient rehab facility. Looking at his left leg he clearly has changes of chronic venous insufficiency no doubt this was contributing to the swelling on the right  leg last week 12/4; right medial lower leg in the setting of significant chronic venous insufficiency. Wound itself has some epithelialization coming from the medial aspect. The lateral aspect of the wound this circular wound has raised edges. We are using silver collagen under compression 12/11; right medial lower leg in the setting of significant chronic venous insufficiency. In general slightly smaller. He is approved for Apligraf without a co-pay and we will order this for next week. I am trying to get this to close as fast as possible. Objective Constitutional Patient is hypertensive.. Pulse regular and within target range for patient.[name] Respirations regular, non-labored and within target range.. Temperature is normal and within the target range for the patient.[name] Appears in no distress. Vitals Time Taken: 10:29 AM, Height: 68 in, Weight: 145 lbs, BMI: 22, Temperature: 97.9 F, Pulse: 88 bpm, Respiratory Rate: 18 breaths/min, Blood Pressure: 160/79 mmHg. Eyes Conjunctivae clear. No discharge.no icterus. Respiratory Respiratory rate is normal. O2 1. Cardiovascular Pedal pulses are palpable. Psychiatric appears at normal baseline. General Notes: Wound exam; right lower calf. Improved surface  area but only by a small amount. Granulation looks generally quite good. Small rim of epithelialization. No evidence of surrounding infection Integumentary (Hair, Skin) Changes of chronic venous insufficiency. Wound #1 status is Open. Original cause of wound was Trauma. The wound is located on the Right Lower Leg. The wound measures 2.5cm length x 1.2cm width x 0.2cm depth; 2.356cm^2 area and 0.471cm^3 volume. There is Fat Layer (Subcutaneous Tissue) Exposed exposed. There is no tunneling or undermining noted. There is a medium amount of serosanguineous drainage noted. The wound margin is distinct with the outline attached to the wound base. There is large (67-100%) red, hyper - granulation within the wound bed. There is a small (1-33%) amount of necrotic tissue within the wound bed including Adherent Slough. Assessment Active Problems ICD-10 Chronic venous hypertension (idiopathic) with ulcer and inflammation of bilateral lower extremity Non-pressure chronic ulcer of other part of right lower leg with fat layer exposed Contusion of right lower leg, subsequent encounter Procedures Wound #1 Pre-procedure diagnosis of Wound #1 is an Abrasion located on the Right Lower Leg . There was a Four Layer Compression Therapy Procedure by Kela Millin, RN. Post procedure Diagnosis Wound #1: Same as Pre-Procedure Plan Follow-up Appointments: Return Appointment in 1 week. Dressing Change Frequency: Do not change entire dressing for one week. Skin Barriers/Peri-Wound Care: Moisturizing lotion Wound Cleansing: May shower with protection. Primary Wound Dressing: Wound #1 Right Lower Leg: Silver Collagen - moisten with hydrogel Foam - apply to shin for protection Secondary Dressing: Wound #1 Right Lower Leg: Dry Gauze ABD pad Drawtex - cut to fit inside wound margins Edema Control: 4 layer compression - Right Lower Extremity Avoid standing for long periods of time Elevate legs to the  level of the heart or above for 30 minutes daily and/or when sitting, a frequency of: Exercise regularly 1. We went ahead and order the Apligraf for next week 2. I think this will give him the best chance to close. 2 3. Until then we continued with the silver collagen. He has had 4-layer compression Electronic Signature(s) Signed: 07/27/2019 11:00:06 AM By: Linton Ham MD Entered By: Linton Ham on 07/27/2019 11:00:05 -------------------------------------------------------------------------------- SuperBill Details Patient Name: Date of Service: [name] 07/27/2019 Medical Record [id number] Patient Account Number: [id number] Date of Birth/Sex: Treating RN: 07-Jun-1935 (84 y.o. [name] Primary Care Provider: Nicholes Stairs Other Clinician: Referring Provider: Treating Provider/Extender:Cochise Dinneen, Vickey Sages, Priscille Heidelberg  Weeks in Treatment: 4 Diagnosis Coding ICD-10 Codes Code Description I87.333 Chronic venous hypertension (idiopathic) with ulcer and inflammation of bilateral lower extremity L97.812 Non-pressure chronic ulcer of other part of right lower leg with fat layer exposed S80.11XD Contusion of right lower leg, subsequent encounter Facility Procedures CPT4 Code Description: [id number] (Facility Use Only) [phone] - [place] LWR LT LEG Modifier: Quantity: 1 Physician Procedures CPT4: Description Modifier Quantity Code [id number] - WC PHYS LEVEL 3 - EST PT 1 ICD-10 Diagnosis Description [id number] Non-pressure chronic ulcer of other part of right lower leg with fat layer exposed I87.333 Chronic venous hypertension (idiopathic)  with ulcer and inflammation of bilateral lower extremity Electronic Signature(s) Signed: 07/27/2019 6:34:01 PM By: Linton Ham MD Entered By: Linton Ham on 07/27/2019 11:00:24

## 2019-07-31 ENCOUNTER — Encounter: Payer: Medicare Other | Admitting: Physical Therapy

## 2019-08-02 ENCOUNTER — Other Ambulatory Visit: Payer: Self-pay | Admitting: Cardiovascular Disease

## 2019-08-02 LAB — CBC AND DIFFERENTIAL
HCT: 37 — AB (ref 41–53)
Hemoglobin: 12.5 — AB (ref 13.5–17.5)
Platelets: 234 (ref 150–399)
WBC: 8.7

## 2019-08-02 LAB — CBC: RBC: 4.34 (ref 3.87–5.11)

## 2019-08-02 LAB — IRON,TIBC AND FERRITIN PANEL: Ferritin: 229.3

## 2019-08-03 ENCOUNTER — Encounter (HOSPITAL_BASED_OUTPATIENT_CLINIC_OR_DEPARTMENT_OTHER): Payer: Medicare Other | Admitting: Internal Medicine

## 2019-08-03 ENCOUNTER — Other Ambulatory Visit: Payer: Self-pay

## 2019-08-03 DIAGNOSIS — I87331 Chronic venous hypertension (idiopathic) with ulcer and inflammation of right lower extremity: Secondary | ICD-10-CM | POA: Diagnosis not present

## 2019-08-03 NOTE — Progress Notes (Signed)
[name] ([id number]) Visit Report for 08/03/2019 Cellular or Tissue Based Product Details Patient Name: [name], [name] Date of Service: 08/03/2019 10:30 AM Medical Record [id number] Patient Account Number: [id number] Date of Birth/Sex: 1934-11-30 (84 y.o. M) Treating RN: Kela Millin Primary Care Provider: Nicholes Stairs Other Clinician: Referring Provider: Treating Provider/Extender:Zakir Henner, Vickey Sages, Dorothe Pea in Treatment: 5 Cellular or Tissue Based Wound #1 Right Lower Leg Product Type Applied to: Performed By: Physician Ricard Dillon., MD Cellular or Tissue Based Apligraf Product Type: Level of Consciousness (Pre- Awake and Alert procedure): Pre-procedure Yes - 12:23 Verification/Time Out Taken: Location: trunk / arms / legs Wound Size (sq cm): 1.6 Product Size (sq cm): 44 Waste Size (sq cm): 0 Amount of Product Applied (sq cm): 44 Instrument Used: Blade, Forceps, Scissors Lot #: [id number] Order #: 1 Expiration Date: 08/03/2019 Fenestrated: Yes Instrument: Blade Reconstituted: Yes Solution Type: normal saline Solution Amount: 81mL Lot #[id number] Solution Expiration Date: 12/13/2020 Secured: Yes Secured With: Steri-Strips Dressing Applied: Yes Primary Dressing: drawtex Procedural Pain: 0 Post Procedural Pain: 0 Response to Treatment: Procedure was tolerated well Level of Consciousness Awake and Alert (Post-procedure): Post Procedure Diagnosis Same as Pre-procedure Electronic Signature(s) Signed: 08/03/2019 6:01:02 PM By: Linton Ham MD Entered By: Linton Ham on 08/03/2019 14:33:05 -------------------------------------------------------------------------------- HPI Details Patient Name: Date of Service: [name] 08/03/2019 10:30 AM Medical Record [id number] Patient Account Number: [id number] Date of Birth/Sex: Treating RN: 12-28-1934 (84 y.o. [name] Primary Care Provider:  Nicholes Stairs Other Clinician: Referring Provider: Treating Provider/Extender:Rojelio Uhrich, Vickey Sages, Dorothe Pea in Treatment: 5 History of Present Illness HPI Description: ADMISSION 06/29/2019 This is an 84 year old man who was referred here for review of the wound on the right medial lower leg. This started as trauma after he dropped his oxygen tank on his leg. By his description he had an intact hematoma that lasted for about 2 weeks and then it spontaneously opened on its own. He was seen by his primary doctor and put on antibiotic therapy on 10/21 with cephalexin 500 twice a day. He completed this recently. The patient has been placing Vaseline gauze over the wound area. He is not in any compression. Past medical history; COPD with chronic oxygen therapy, back pain, gait instability, status post left inguinal hernia repair on 02/07/2019, chronic venous insufficiency, chronic atrial fibrillation. He is currently going to outpatient physical therapy ABIs in our clinic were 1.08 on the right 11/20 punched out area on the right medial lower leg. We have debrided this last week applied silver alginate under compression. There is no option for home health as he is going out to outpatient physical therapy. He is reluctant to use home health companies for physical therapy that he feels gets better service at an outpatient rehab facility. Looking at his left leg he clearly has changes of chronic venous insufficiency no doubt this was contributing to the swelling on the right leg last week 12/4; right medial lower leg in the setting of significant chronic venous insufficiency. Wound itself has some epithelialization coming from the medial aspect. The lateral aspect of the wound this circular wound has raised edges. We are using silver collagen under compression 12/11; right medial lower leg in the setting of significant chronic venous insufficiency. In general slightly smaller. He is  approved for Apligraf without a co-pay and we will order this for next week. I am trying to get this to close as fast as possible. 12/18; right medial lower  leg in the setting of chronic venous stasis. We applied Apligraf #1 Electronic Signature(s) Signed: 08/03/2019 6:01:02 PM By: Linton Ham MD Entered By: Linton Ham on 08/03/2019 14:33:36 -------------------------------------------------------------------------------- Physical Exam Details Patient Name: Date of Service: [name] 08/03/2019 10:30 AM Medical Record [id number] Patient Account Number: [id number] Date of Birth/Sex: Treating RN: [birth year] (84 y.o. [name] Primary Care Provider: Nicholes Stairs Other Clinician: Referring Provider: Treating Provider/Extender:Kaire Stary, Vickey Sages, Dorothe Pea in Treatment: 5 Constitutional Patient is hypertensive.. Pulse regular and within target range for patient.[name] Respirations regular, non-labored and within target range.. Temperature is normal and within the target range for the patient.[name] Appears in no distress. Notes Wound exam; right lower calf. We applied Apligraf #1 in a standard fashion. Surface of the wound looks satisfactory for application no evidence of infection Electronic Signature(s) Signed: 08/03/2019 6:01:02 PM By: Linton Ham MD Entered By: Linton Ham on 08/03/2019 14:34:43 -------------------------------------------------------------------------------- Physician Orders Details Patient Name: Date of Service: [name] 08/03/2019 10:30 AM Medical Record [id number] Patient Account Number: [id number] Date of Birth/Sex: Treating RN: 1935-01-17 (84 y.o. [name] Primary Care Provider: Nicholes Stairs Other Clinician: Referring Provider: Treating Provider/Extender:Jerzie Bieri, Vickey Sages, Dorothe Pea in Treatment: 5 Verbal / Phone Orders: No Diagnosis Coding ICD-10 Coding Code  Description [phone] Chronic venous hypertension (idiopathic) with ulcer and inflammation of bilateral lower extremity L97.812 Non-pressure chronic ulcer of other part of right lower leg with fat layer exposed S80.11XD Contusion of right lower leg, subsequent encounter Follow-up Appointments Nurse Visit: - next week Dressing Change Frequency Do not change entire dressing for one week. Skin Barriers/Peri-Wound Care Moisturizing lotion Wound Cleansing May shower with protection. Primary Wound Dressing Wound #1 Right Lower Leg Skin Substitute Application - Apligraft #1 Foam - foam to shin Secondary Dressing Wound #1 Right Lower Leg Adaptic Dressing - with steri-strips Drawtex Edema Control 3 Layer Compression System - Right Lower Extremity Avoid standing for long periods of time Elevate legs to the level of the heart or above for 30 minutes daily and/or when sitting, a frequency of: Exercise regularly Electronic Signature(s) Signed: 08/03/2019 5:42:17 PM By: Kela Millin Signed: 08/03/2019 6:01:02 PM By: Linton Ham MD Entered By: Kela Millin on 08/03/2019 12:28:29 -------------------------------------------------------------------------------- Problem List Details Patient Name: Date of Service: [name] 08/03/2019 10:30 AM Medical Record [id number] Patient Account Number: [id number] Date of Birth/Sex: Treating RN: 1934-10-02 (84 y.o. [name] Primary Care Provider: Nicholes Stairs Other Clinician: Referring Provider: Treating Provider/Extender:Elya Diloreto, Vickey Sages, Dorothe Pea in Treatment: 5 Active Problems ICD-10 Evaluated Encounter Code Description Active Date Today Diagnosis I87.333 Chronic venous hypertension (idiopathic) with ulcer 07/06/2019 No Yes and inflammation of bilateral lower extremity L97.812 Non-pressure chronic ulcer of other part of right lower 06/29/2019 No Yes leg with fat layer exposed S80.11XD  Contusion of right lower leg, subsequent encounter 06/29/2019 No Yes Inactive Problems Resolved Problems Electronic Signature(s) Signed: 08/03/2019 6:01:02 PM By: Linton Ham MD Entered By: Linton Ham on 08/03/2019 14:32:48 -------------------------------------------------------------------------------- Progress Note Details Patient Name: Date of Service: [name] 08/03/2019 10:30 AM Medical Record [id number] Patient Account Number: [id number] Date of Birth/Sex: Treating RN: 10/02/1934 (84 y.o. [name] Primary Care Provider: Nicholes Stairs Other Clinician: Referring Provider: Treating Provider/Extender:Radha Coggins, Vickey Sages, Dorothe Pea in Treatment: 5 Subjective History of Present Illness (HPI) ADMISSION 06/29/2019 This is an 84 year old man who was referred here for review of the wound on the right medial lower leg. This started as trauma  after he dropped his oxygen tank on his leg. By his description he had an intact hematoma that lasted for about 2 weeks and then it spontaneously opened on its own. He was seen by his primary doctor and put on antibiotic therapy on 10/21 with cephalexin 500 twice a day. He completed this recently. The patient has been placing Vaseline gauze over the wound area. He is not in any compression. Past medical history; COPD with chronic oxygen therapy, back pain, gait instability, status post left inguinal hernia repair on 02/07/2019, chronic venous insufficiency, chronic atrial fibrillation. He is currently going to outpatient physical therapy ABIs in our clinic were 1.08 on the right 11/20 punched out area on the right medial lower leg. We have debrided this last week applied silver alginate under compression. There is no option for home health as he is going out to outpatient physical therapy. He is reluctant to use home health companies for physical therapy that he feels gets better service at an outpatient  rehab facility. Looking at his left leg he clearly has changes of chronic venous insufficiency no doubt this was contributing to the swelling on the right leg last week 12/4; right medial lower leg in the setting of significant chronic venous insufficiency. Wound itself has some epithelialization coming from the medial aspect. The lateral aspect of the wound this circular wound has raised edges. We are using silver collagen under compression 12/11; right medial lower leg in the setting of significant chronic venous insufficiency. In general slightly smaller. He is approved for Apligraf without a co-pay and we will order this for next week. I am trying to get this to close as fast as possible. 12/18; right medial lower leg in the setting of chronic venous stasis. We applied Apligraf #1 Objective Constitutional Patient is hypertensive.. Pulse regular and within target range for patient.[name] Respirations regular, non-labored and within target range.. Temperature is normal and within the target range for the patient.[name] Appears in no distress. Vitals Time Taken: 11:38 AM, Height: 68 in, Weight: 145 lbs, BMI: 22, Temperature: 98.3 F, Pulse: 74 bpm, Respiratory Rate: 18 breaths/min, Blood Pressure: 158/60 mmHg. General Notes: Wound exam; right lower calf. We applied Apligraf #1 in a standard fashion. Surface of the wound looks satisfactory for application no evidence of infection Integumentary (Hair, Skin) Wound #1 status is Open. Original cause of wound was Trauma. The wound is located on the Right Lower Leg. The wound measures 2cm length x 0.8cm width x 0.1cm depth; 1.257cm^2 area and 0.126cm^3 volume. There is Fat Layer (Subcutaneous Tissue) Exposed exposed. There is no tunneling or undermining noted. There is a medium amount of serosanguineous drainage noted. The wound margin is distinct with the outline attached to the wound base. There is large (67-100%) red, hyper - granulation within the wound  bed. There is a small (1-33%) amount of necrotic tissue within the wound bed including Adherent Slough. Assessment Active Problems ICD-10 Chronic venous hypertension (idiopathic) with ulcer and inflammation of bilateral lower extremity Non-pressure chronic ulcer of other part of right lower leg with fat layer exposed Contusion of right lower leg, subsequent encounter Procedures Wound #1 Pre-procedure diagnosis of Wound #1 is an Abrasion located on the Right Lower Leg. A skin graft procedure using a bioengineered skin substitute/cellular or tissue based product was performed by Ricard Dillon., MD with the following instrument(s): Blade, Forceps, and Scissors. Apligraf was applied and secured with Steri-Strips. 44 sq cm of product was utilized and 0 sq cm was  wasted. Post Application, drawtex was applied. A Time Out was conducted at 12:23, prior to the start of the procedure. The procedure was tolerated well with a pain level of 0 throughout and a pain level of 0 following the procedure. Post procedure Diagnosis Wound #1: Same as Pre-Procedure . Pre-procedure diagnosis of Wound #1 is an Abrasion located on the Right Lower Leg . There was a Three Layer Compression Therapy Procedure by Deon Pilling, RN. Post procedure Diagnosis Wound #1: Same as Pre-Procedure Plan Follow-up Appointments: Nurse Visit: - next week Dressing Change Frequency: Do not change entire dressing for one week. Skin Barriers/Peri-Wound Care: Moisturizing lotion Wound Cleansing: May shower with protection. Primary Wound Dressing: Wound #1 Right Lower Leg: Skin Substitute Application - Apligraft #1 Foam - foam to shin Secondary Dressing: Wound #1 Right Lower Leg: Adaptic Dressing - with steri-strips Drawtex Edema Control: 3 Layer Compression System - Right Lower Extremity Avoid standing for long periods of time Elevate legs to the level of the heart or above for 30 minutes daily and/or when sitting, a  frequency of: Exercise regularly 1. Apligraf #1 in the standard fashion. I will see him again in 2 weeks Electronic Signature(s) Signed: 08/03/2019 6:01:02 PM By: Linton Ham MD Entered By: Linton Ham on 08/03/2019 14:36:28 -------------------------------------------------------------------------------- SuperBill Details Patient Name: Date of Service: [name] 08/03/2019 Medical Record [id number] Patient Account Number: [id number] Date of Birth/Sex: Treating RN: [birth year] (84 y.o. [name] Primary Care Provider: Nicholes Stairs Other Clinician: Referring Provider: Treating Provider/Extender:Kalani Sthilaire, Vickey Sages, Dorothe Pea in Treatment: 5 Diagnosis Coding ICD-10 Codes Code Description [phone] Chronic venous hypertension (idiopathic) with ulcer and inflammation of bilateral lower extremity L97.812 Non-pressure chronic ulcer of other part of right lower leg with fat layer exposed S80.11XD Contusion of right lower leg, subsequent encounter Facility Procedures CPT4: Description Modifier Quantity Code [id number] (Facility Use Only) Apligraf 1 SQ CM 44 CPT4: [id number] - SKIN SUB GRAFT TRNK/ARM/LEG 1 ICD-10 Diagnosis Description I87.333 Chronic venous hypertension (idiopathic) with ulcer and inflammation of bilateral lower extremity L97.812 Non-pressure chronic ulcer of other part of right lower  leg with fat layer exposed Physician Procedures CPT4: Code [id number] Description: 75 - WC PHYS SKIN SUB GRAFT TRNK/ARM/LEG ICD-10 Diagnosis Description I87.333 Chronic venous hypertension (idiopathic) with ulcer and inflam lower extremity L97.812 Non-pressure chronic ulcer of other part of right lower leg wi Modifier: mation of bila th fat layer e Quantity: 1 teral xposed Electronic Signature(s) Signed: 08/03/2019 6:01:02 PM By: Linton Ham MD Entered By: Linton Ham on 08/03/2019 14:38:20

## 2019-08-03 NOTE — Progress Notes (Addendum)
DIONTAE, ZELLER ([id number]) Visit Report for 08/03/2019 Arrival Information Details Patient Name: Date of Service: ABDOULIE, LUCCHESI 08/03/2019 10:30 AM Medical Record [id number] Patient Account Number: [id number] Date of Birth/Sex: Treating RN: 05-02-1935 (84 y.o. [name]) Carlene Coria Primary Care Denard Tuminello: Nicholes Stairs Other Clinician: Referring Aleasha Fregeau: Treating Leen Tworek/Extender:Robson, Vickey Sages, Dorothe Pea in Treatment: 5 Visit Information History Since Last Visit Walker All ordered tests and consults were completed: No Patient Arrived: Added or deleted any medications: No Arrival Time: 11:36 Any new allergies or adverse reactions: No Accompanied By: self Had a fall or experienced change in No Transfer Assistance: None activities of daily living that may affect Patient Identification Verified: Yes risk of falls: Secondary Verification Process Completed: Yes Signs or symptoms of abuse/neglect since last visito No Patient Requires Transmission-Based No Hospitalized since last visit: No Precautions: Implantable device outside of the clinic excluding No Patient Has Alerts: No cellular tissue based products placed in the center since last visit: Pain Present Now: No Electronic Signature(s) Signed: 08/03/2019 5:51:21 PM By: Carlene Coria RN Entered By: Carlene Coria on 08/03/2019 11:37:55 -------------------------------------------------------------------------------- Compression Therapy Details Patient Name: Date of Service: MANKIRAT, IGLESIAS 08/03/2019 10:30 AM Medical Record [id number] Patient Account Number: [id number] Date of Birth/Sex: Treating RN: 06-Oct-1934 (84 y.o. [name] Primary Care Damario Gillie: Nicholes Stairs Other Clinician: Referring Jacki Couse: Treating Jensen Kilburg/Extender:Robson, Vickey Sages, Dorothe Pea in Treatment: 5 Compression Therapy Performed for Wound Wound #1 Right Lower Leg Assessment: Performed By:  Clinician Deon Pilling, RN Compression Type: Three Layer Post Procedure Diagnosis Same as Pre-procedure Electronic Signature(s) Signed: 08/03/2019 5:42:17 PM By: Kela Millin Entered By: Kela Millin on 08/03/2019 12:26:51 -------------------------------------------------------------------------------- Encounter Discharge Information Details Patient Name: Date of Service: [name] 08/03/2019 10:30 AM Medical Record [id number] Patient Account Number: [id number] Date of Birth/Sex: Treating RN: [birth year] (84 y.o. Lorette Ang, Tammi Klippel Primary Care Henya Aguallo: Nicholes Stairs Other Clinician: Referring Milagro Belmares: Treating Ahmet Schank/Extender:Robson, Vickey Sages, Dorothe Pea in Treatment: 5 Encounter Discharge Information Items Post Procedure Vitals Discharge Condition: Stable Temperature (F): 98.3 Ambulatory Status: Walker Pulse (bpm): 74 Discharge Destination: Home Respiratory Rate (breaths/min): 18 Transportation: Private Auto Blood Pressure (mmHg): 158/60 Accompanied By: caregiver Schedule Follow-up Appointment: Yes Clinical Summary of Care: Electronic Signature(s) Signed: 08/03/2019 5:50:42 PM By: Deon Pilling Entered By: Deon Pilling on 08/03/2019 12:33:41 -------------------------------------------------------------------------------- Lower Extremity Assessment Details Patient Name: Date of Service: JOSNIEL, EKBERG 08/03/2019 10:30 AM Medical Record [id number] Patient Account Number: [id number] Date of Birth/Sex: Treating RN: 07/03/1935 (84 y.o. [name]) Carlene Coria Primary Care Arwilda Georgia: Nicholes Stairs Other Clinician: Referring Surie Suchocki: Treating Elner Seifert/Extender:Robson, Vickey Sages, Dorothe Pea in Treatment: 5 Edema Assessment Assessed: [Left: No] [Right: No] Edema: [Left: Ye] [Right: s] Calf Left: Right: Point of Measurement: 39 cm From Medial Instep cm 28 cm Ankle Left: Right: Point of Measurement: 10 cm From Medial Instep  cm 22 cm Electronic Signature(s) Signed: 08/03/2019 5:51:21 PM By: Carlene Coria RN Entered By: Carlene Coria on 08/03/2019 11:45:02 -------------------------------------------------------------------------------- Multi Wound Chart Details Patient Name: Date of Service: [name] 08/03/2019 10:30 AM Medical Record [id number] Patient Account Number: [id number] Date of Birth/Sex: Treating RN: 07-03-1935 (84 y.o. [name] Primary Care Telma Pyeatt: Nicholes Stairs Other Clinician: Referring Imagene Boss: Treating Joetta Delprado/Extender:Robson, Vickey Sages, Dorothe Pea in Treatment: 5 Vital Signs Height(in): 68 Pulse(bpm): 74 Weight(lbs): 145 Blood Pressure(mmHg): 158/60 Body Mass Index(BMI): 22 Temperature(F): 98.3 Respiratory 18 Rate(breaths/min): Photos: [1:No Photos] [N/A:N/A] Wound Location: [1:Right Lower Leg] [N/A:N/A] Wounding Event: [1:Trauma] [N/A:N/A]  Primary Etiology: [1:Abrasion] [N/A:N/A] Comorbid History: [1:Chronic Obstructive Pulmonary Disease (COPD), Hypertension, Neuropathy] [N/A:N/A] Date Acquired: [1:05/17/2019] [N/A:N/A] Weeks of Treatment: [1:5] [N/A:N/A] Wound Status: [1:Open] [N/A:N/A] Measurements L x W x D 2x0.8x0.1 [N/A:N/A] (cm) Area (cm) : [1:1.257] [N/A:N/A] Volume (cm) : [1:0.126] [N/A:N/A] % Reduction in Area: [1:80.00%] [N/A:N/A] % Reduction in Volume: 96.70% [N/A:N/A] Classification: [1:Full Thickness Without Exposed Support Structures] [N/A:N/A] Exudate Amount: [1:Medium] [N/A:N/A] Exudate Type: [1:Serosanguineous] [N/A:N/A] Exudate Color: [1:red, brown] [N/A:N/A] Wound Margin: [1:Distinct, outline attached N/A] Granulation Amount: [1:Large (67-100%)] [N/A:N/A] Granulation Quality: [1:Red, Hyper-granulation] [N/A:N/A] Necrotic Amount: [1:Small (1-33%)] [N/A:N/A] Exposed Structures: [1:Fat Layer (Subcutaneous Tissue) Exposed: Yes Fascia: No Tendon: No Muscle: No Joint: No Bone: No] [N/A:N/A] Epithelialization:  [1:Small (1-33%)] [N/A:N/A] Procedures Performed: [1:Cellular or Tissue Based Product Compression Therapy] [N/A:N/A] Treatment Notes Wound #1 (Right Lower Leg) 1. Cleanse With Wound Cleanser Soap and water 2. Periwound Care Moisturizing lotion Skin Prep 3. Primary Dressing Applied Cellular Based Tissue Product Other primary dressing (specifiy in notes) 4. Secondary Dressing ABD Pad Drawtex 6. Support Layer Applied 3 layer compression wrap Notes primary dressing apligraft #1 applied by MD and secured with adaptic and steri-strips. netting. foam to shin for protection under wrap. Electronic Signature(s) Signed: 08/03/2019 5:42:17 PM By: Kela Millin Signed: 08/03/2019 6:01:02 PM By: Linton Ham MD Entered By: Linton Ham on 08/03/2019 14:32:54 -------------------------------------------------------------------------------- Multi-Disciplinary Care Plan Details Patient Name: Date of Service: VALIN, HARTSEL 08/03/2019 10:30 AM Medical Record [id number] Patient Account Number: [id number] Date of Birth/Sex: Treating RN: [birth year] (84 y.o. [name] Primary Care Reco Shonk: Nicholes Stairs Other Clinician: Referring Kyleena Scheirer: Treating Bruk Tumolo/Extender:Robson, Vickey Sages, Dorothe Pea in Treatment: 5 Active Inactive Necrotic Tissue Nursing Diagnoses: Impaired tissue integrity related to necrotic/devitalized tissue Goals: Necrotic/devitalized tissue will be minimized in the wound bed Date Initiated: 06/29/2019 Target Resolution Date: 08/24/2019 Goal Status: Active Interventions: Provide education on necrotic tissue and debridement process Notes: Wound/Skin Impairment Nursing Diagnoses: Impaired tissue integrity Knowledge deficit related to smoking impact on wound healing Goals: Ulcer/skin breakdown will have a volume reduction of 30% by week 4 Date Initiated: 06/29/2019 Target Resolution Date: 08/24/2019 Goal Status:  Active Interventions: Provide education on ulcer and skin care Notes: Electronic Signature(s) Signed: 08/03/2019 5:42:17 PM By: Kela Millin Entered By: Kela Millin on 08/03/2019 12:15:45 -------------------------------------------------------------------------------- Pain Assessment Details Patient Name: Date of Service: NICKLAS, HEINTZ 08/03/2019 10:30 AM Medical Record [id number] Patient Account Number: [id number] Date of Birth/Sex: Treating RN: 20-Jul-1935 (84 y.o. [name]) Carlene Coria Primary Care Cannen Dupras: Nicholes Stairs Other Clinician: Referring Wyonia Fontanella: Treating Armond Cuthrell/Extender:Robson, Vickey Sages, Dorothe Pea in Treatment: 5 Active Problems Location of Pain Severity and Description of Pain Patient Has Paino No Site Locations Pain Management and Medication Current Pain Management: Electronic Signature(s) Signed: 08/03/2019 5:51:21 PM By: Carlene Coria RN Entered By: Carlene Coria on 08/03/2019 11:38:41 -------------------------------------------------------------------------------- Patient/Caregiver Education Details Patient Name: [name] 12/18/2020andnbsp10:30 Date of Service: AM Medical Record [id number] Number: Patient Account Number: [id number] Treating RN: Date of Birth/Gender: 17-Jul-1935 (84 y.o. [name]) Other Clinician: Primary Care Physician: Avie Echevaria Referring Physician: Physician/Extender: Deliah Boston in Treatment: 5 Education Assessment Education Provided To: Patient Education Topics Provided Wound Debridement: Methods: Explain/Verbal Responses: State content correctly Wound/Skin Impairment: Methods: Explain/Verbal Responses: State content correctly Electronic Signature(s) Signed: 08/03/2019 5:42:17 PM By: Kela Millin Entered By: Kela Millin on 08/03/2019  12:16:07 -------------------------------------------------------------------------------- Wound Assessment Details Patient Name: Date of Service: [name] 08/03/2019 10:30 AM Medical Record [id number] Patient  Account Number: [id number] Date of Birth/Sex: Treating RN: [birth year] (84 y.o. [name]) Carlene Coria Primary Care Jesyca Weisenburger: Nicholes Stairs Other Clinician: Referring Lionardo Haze: Treating Paulita Licklider/Extender:Robson, Vickey Sages, Dorothe Pea in Treatment: 5 Wound Status Wound Number: 1 Primary Abrasion Etiology: Wound Location: Right Lower Leg Wound Open Wounding Event: Trauma Status: Date Acquired: 05/17/2019 Comorbid Chronic Obstructive Pulmonary Disease Weeks Of Treatment: 5 History: (COPD), Hypertension, Neuropathy Clustered Wound: No Photos Wound Measurements Length: (cm) 2 % Reduc Width: (cm) 0.8 % Reduc Depth: (cm) 0.1 Epithel Area: (cm) 1.257 Tunnel Volume: (cm) 0.126 Underm Wound Description Classification: Full Thickness Without Exposed Support Foul O Structures Slough Wound Distinct, outline attached Margin: Exudate Medium Amount: Exudate Serosanguineous Type: Exudate red, brown Color: Wound Bed Granulation Amount: Large (67-100%) Granulation Quality: Red, Hyper-granulation Fascia Necrotic Amount: Small (1-33%) Fat Lay Necrotic Quality: Adherent Slough Tendon Muscle Join Bone dor After Cleansing: No /Fibrino Yes Exposed Structure Exposed: No er (Subcutaneous Tissue) Exposed: Yes Exposed: No Exposed: No t Exposed: No Exposed: No tion in Area: 80% tion in Volume: 96.7% ialization: Small (1-33%) ing: No ining: No Treatment Notes Wound #1 (Right Lower Leg) 1. Cleanse With Wound Cleanser Soap and water 2. Periwound Care Moisturizing lotion Skin Prep 3. Primary Dressing Applied Cellular Based Tissue Product Other primary dressing (specifiy in notes) 4. Secondary Dressing ABD Pad Drawtex 6. Support Layer Applied 3  layer compression wrap Notes primary dressing apligraft #1 applied by MD and secured with adaptic and steri-strips. netting. foam to shin for protection under wrap. Electronic Signature(s) Signed: 08/06/2019 6:06:17 PM By: Mikeal Hawthorne EMT/HBOT Signed: 08/08/2019 11:01:46 AM By: Carlene Coria RN Previous Signature: 08/03/2019 5:51:21 PM Version By: Carlene Coria RN Entered By: Mikeal Hawthorne on 08/06/2019 14:21:49 -------------------------------------------------------------------------------- Vitals Details Patient Name: Date of Service: [name] 08/03/2019 10:30 AM Medical Record [id number] Patient Account Number: [id number] Date of Birth/Sex: Treating RN: 1934/11/03 (84 y.o. [name]) Carlene Coria Primary Care Deetta Siegmann: Nicholes Stairs Other Clinician: Referring Trejon Duford: Treating Aries Townley/Extender:Robson, Vickey Sages, Dorothe Pea in Treatment: 5 Vital Signs Time Taken: 11:38 Temperature (F): 98.3 Height (in): 68 Pulse (bpm): 74 Weight (lbs): 145 Respiratory Rate (breaths/min): 18 Body Mass Index (BMI): 22 Blood Pressure (mmHg): 158/60 Reference Range: 80 - 120 mg / dl Electronic Signature(s) Signed: 08/03/2019 5:51:21 PM By: Carlene Coria RN Entered By: Carlene Coria on 08/03/2019 11:38:34

## 2019-08-09 ENCOUNTER — Other Ambulatory Visit: Payer: Self-pay

## 2019-08-09 ENCOUNTER — Encounter (HOSPITAL_BASED_OUTPATIENT_CLINIC_OR_DEPARTMENT_OTHER): Payer: Medicare Other | Admitting: Internal Medicine

## 2019-08-09 DIAGNOSIS — I87331 Chronic venous hypertension (idiopathic) with ulcer and inflammation of right lower extremity: Secondary | ICD-10-CM | POA: Diagnosis not present

## 2019-08-13 NOTE — Progress Notes (Signed)
[name], [name] ([id number]) Visit Report for 08/09/2019 SuperBill Details Patient Name: Date of Service: [name], [name] 08/09/2019 Medical Record [id number] Patient Account Number: [id number] Date of Birth/Sex: Treating RN: [birth year] (84 y.o. [name] Primary Care Provider: Nicholes Stairs Other Clinician: Referring Provider: Treating Provider/Extender:Manessa Buley, Vickey Sages, Dorothe Pea in Treatment: 5 Diagnosis Coding ICD-10 Codes Code Description [phone] Chronic venous hypertension (idiopathic) with ulcer and inflammation of bilateral lower extremity L97.812 Non-pressure chronic ulcer of other part of right lower leg with fat layer exposed S80.11XD Contusion of right lower leg, subsequent encounter Facility Procedures CPT4 Code Description Modifier Quantity [id number] (Facility Use Only) [phone] - APPLY MULTLAY COMPRS LWR RT LEG 1 Electronic Signature(s) Signed: 08/09/2019 12:25:31 PM By: Linton Ham MD Signed: 08/13/2019 5:46:04 PM By: Kela Millin Entered By: Kela Millin on 08/09/2019 10:24:26

## 2019-08-13 NOTE — Telephone Encounter (Signed)
Hemoglobin is the main one- would like it between 13 and 15 grams

## 2019-08-13 NOTE — Progress Notes (Signed)
ADITHYA, ALMAN ([id number]) Visit Report for 08/09/2019 Arrival Information Details Patient Name: Date of Service: NELVIN, MCCOWAN 08/09/2019 10:00 AM Medical Record [id number] Patient Account Number: [id number] Date of Birth/Sex: Treating RN: [birth year] (84 y.o. [name] Primary Care Tytiana Coles: Nicholes Stairs Other Clinician: Referring Jadalee Westcott: Treating Scotti Motter/Extender:Robson, Vickey Sages, Dorothe Pea in Treatment: 5 Visit Information History Since Last Visit Walker Added or deleted any medications: No Patient Arrived: Any new allergies or adverse reactions: No Arrival Time: 10:09 Had a fall or experienced change in No Accompanied By: self activities of daily living that may affect Transfer Assistance: None risk of falls: Patient Identification Verified: Yes Signs or symptoms of abuse/neglect since last No Secondary Verification Process Completed: Yes visito Patient Requires Transmission-Based No Hospitalized since last visit: No Precautions: Implantable device outside of the clinic excluding No Patient Has Alerts: No cellular tissue based products placed in the center since last visit: Has Dressing in Place as Prescribed: Yes Has Compression in Place as Prescribed: Yes Pain Present Now: No Electronic Signature(s) Signed: 08/13/2019 5:46:04 PM By: Kela Millin Entered By: Kela Millin on 08/09/2019 10:09:47 -------------------------------------------------------------------------------- Compression Therapy Details Patient Name: Date of Service: [name] 08/09/2019 10:00 AM Medical Record [id number] Patient Account Number: [id number] Date of Birth/Sex: Treating RN: 07-09-1935 (84 y.o. [name] Primary Care Keiasia Christianson: Nicholes Stairs Other Clinician: Referring Hiedi Touchton: Treating Adell Panek/Extender:Robson, Vickey Sages, Dorothe Pea in Treatment: 5 Compression Therapy Performed for Wound Wound #1  Right Lower Leg Assessment: Performed By: Clinician Kela Millin, RN Compression Type: Three Layer Electronic Signature(s) Signed: 08/13/2019 5:46:04 PM By: Kela Millin Entered By: Kela Millin on 08/09/2019 10:22:32 -------------------------------------------------------------------------------- Encounter Discharge Information Details Patient Name: Date of Service: SKYE, MCCLAMMY 08/09/2019 10:00 AM Medical Record [id number] Patient Account Number: [id number] Date of Birth/Sex: Treating RN: 10/28/1934 (84 y.o. [name] Primary Care Cletis Clack: Nicholes Stairs Other Clinician: Referring Guerino Caporale: Treating Jos Cygan/Extender:Robson, Vickey Sages, Dorothe Pea in Treatment: 5 Encounter Discharge Information Items Discharge Condition: Stable Ambulatory Status: Walker Discharge Destination: Home Transportation: Private Auto Accompanied By: self Schedule Follow-up Appointment: Yes Clinical Summary of Care: Patient Declined Electronic Signature(s) Signed: 08/13/2019 5:46:04 PM By: Kela Millin Entered By: Kela Millin on 08/09/2019 10:24:17 -------------------------------------------------------------------------------- Patient/Caregiver Education Details Patient Name: [name] 12/24/2020andnbsp10:00 Date of Service: AM Medical Record [id number] Number: Patient Account Number: [id number] Treating RN: Date of Birth/Gender: 1935/03/01 (84 y.o. [name]) Other Clinician: Primary Care Treating Little, Rudi Rummage Physician: Physician/Extender: Referring Physician: Deliah Boston in Treatment: 5 Education Assessment Education Provided To: Patient Education Topics Provided Wound Debridement: Methods: Explain/Verbal Responses: State content correctly Wound/Skin Impairment: Methods: Explain/Verbal Responses: State content correctly Electronic Signature(s) Signed: 08/13/2019 5:46:04 PM  By: Kela Millin Entered By: Kela Millin on 08/09/2019 10:24:04 -------------------------------------------------------------------------------- Wound Assessment Details Patient Name: Date of Service: [name] 08/09/2019 10:00 AM Medical Record [id number] Patient Account Number: [id number] Date of Birth/Sex: Treating RN: 1935-05-10 (84 y.o. [name] Primary Care Prabhleen Montemayor: Nicholes Stairs Other Clinician: Referring Kerly Rigsbee: Treating Annett Boxwell/Extender:Robson, Vickey Sages, Dorothe Pea in Treatment: 5 Wound Status Wound Number: 1 Primary Abrasion Etiology: Wound Location: Right Lower Leg Wound Open Wounding Event: Trauma Status: Date Acquired: 05/17/2019 Comorbid Chronic Obstructive Pulmonary Disease Weeks Of Treatment: 5 History: (COPD), Hypertension, Neuropathy Clustered Wound: No Wound Measurements Length: (cm) 2 % Reduc Width: (cm) 0.8 % Reduc Depth: (cm) 0.1 Epithel Area: (cm) 1.257 Tunnel Volume: (cm) 0.126 Underm Wound Description Classification: Full Thickness  Without Exposed Support Structures Wound Distinct, outline attached Margin: Exudate Medium Amount: Exudate Serosanguineous Type: Exudate red, brown Color: Wound Bed Granulation Amount: Large (67-100%) Granulation Quality: Red, Hyper-granulation Necrotic Amount: Small (1-33%) Necrotic Quality: Adherent Slough Foul Odor After Cleansing: No Slough/Fibrino Yes Exposed Structure Fascia Exposed: No Fat Layer (Subcutaneous Tissue) Exposed: Yes Tendon Exposed: No Muscle Exposed: No Joint Exposed: No Bone Exposed: No tion in Area: 80% tion in Volume: 96.7% ialization: Small (1-33%) ing: No ining: No Treatment Notes Wound #1 (Right Lower Leg) 1. Cleanse With Wound Cleanser Soap and water 2. Periwound Care Moisturizing lotion 4. Secondary Dressing ABD Pad Drawtex 6. Support Layer Applied 3 layer compression wrap Notes apligraft to remain in  place with adaptic and sertistrips. re-wrapped Electronic Signature(s) Signed: 08/13/2019 5:46:04 PM By: Kela Millin Entered By: Kela Millin on 08/09/2019 10:22:03 -------------------------------------------------------------------------------- Vitals Details Patient Name: Date of Service: [name] 08/09/2019 10:00 AM Medical Record [id number] Patient Account Number: [id number] Date of Birth/Sex: Treating RN: 1935-03-11 (84 y.o. [name] Primary Care Chicquita Mendel: Nicholes Stairs Other Clinician: Referring Allice Garro: Treating Mattew Chriswell/Extender:Robson, Vickey Sages, Dorothe Pea in Treatment: 5 Vital Signs Time Taken: 10:05 Temperature (F): 98.2 Height (in): 68 Pulse (bpm): 81 Weight (lbs): 145 Respiratory Rate (breaths/min): 18 Body Mass Index (BMI): 22 Blood Pressure (mmHg): 152/67 Reference Range: 80 - 120 mg / dl Electronic Signature(s) Signed: 08/13/2019 5:46:04 PM By: Kela Millin Entered By: Kela Millin on 08/09/2019 10:10:17

## 2019-08-16 ENCOUNTER — Other Ambulatory Visit: Payer: Self-pay

## 2019-08-16 ENCOUNTER — Encounter (HOSPITAL_BASED_OUTPATIENT_CLINIC_OR_DEPARTMENT_OTHER): Payer: Medicare Other | Admitting: Internal Medicine

## 2019-08-16 DIAGNOSIS — I87331 Chronic venous hypertension (idiopathic) with ulcer and inflammation of right lower extremity: Secondary | ICD-10-CM | POA: Diagnosis not present

## 2019-08-16 NOTE — Progress Notes (Signed)
LENDAL, FURNAS ([id number]) Visit Report for 08/16/2019 Cellular or Tissue Based Product Details Patient Name: [name], [name] Date of Service: 08/16/2019 10:30 AM Medical Record [id number] Patient Account Number: [id number] Date of Birth/Sex: 09/26/1934 (84 y.o. M) Treating RN: Deon Pilling Primary Care Provider: Nicholes Stairs Other Clinician: Referring Provider: Treating Provider/Extender:Yuridia Couts, Vickey Sages, Dorothe Pea in Treatment: 6 Cellular or Tissue Based Wound #1 Right Lower Leg Product Type Applied to: Performed By: Physician Ricard Dillon., MD Cellular or Tissue Based Apligraf Product Type: Level of Consciousness (Pre- Awake and Alert procedure): Pre-procedure Yes - 11:50 Verification/Time Out Taken: Location: trunk / arms / legs Wound Size (sq cm): 0.7 Product Size (sq cm): 44 Waste Size (sq cm): 33 Waste Reason: wound size Amount of Product Applied (sq cm): 11 Instrument Used: Blade, Forceps, Scissors Lot #: [id number] Order #: 2 Expiration Date: 08/23/2019 Fenestrated: Yes Instrument: Blade Reconstituted: Yes Solution Type: normal saline Solution Amount: 10 mL Lot #[id number] Solution Expiration Date: 12/13/2020 Secured: Yes Secured With: Steri-Strips, adaptic Dressing Applied: No Procedural Pain: 0 Post Procedural Pain: 0 Response to Treatment: Procedure was tolerated well Level of Consciousness Awake and Alert (Post-procedure): Post Procedure Diagnosis Same as Pre-procedure Electronic Signature(s) Signed: 08/16/2019 2:35:12 PM By: Linton Ham MD Signed: 08/16/2019 2:55:42 PM By: Deon Pilling Entered By: Deon Pilling on 08/16/2019 14:08:48 -------------------------------------------------------------------------------- HPI Details Patient Name: Date of Service: [name] 08/16/2019 10:30 AM Medical Record [id number] Patient Account Number: [id number] Date of Birth/Sex: Treating RN: 09-20-1934 (84  y.o. M) Primary Care Provider: Nicholes Stairs Other Clinician: Referring Provider: Treating Provider/Extender:Alexander Mcauley, Vickey Sages, Dorothe Pea in Treatment: 6 History of Present Illness HPI Description: ADMISSION 06/29/2019 This is an 84 year old man who was referred here for review of the wound on the right medial lower leg. This started as trauma after he dropped his oxygen tank on his leg. By his description he had an intact hematoma that lasted for about 2 weeks and then it spontaneously opened on its own. He was seen by his primary doctor and put on antibiotic therapy on 10/21 with cephalexin 500 twice a day. He completed this recently. The patient has been placing Vaseline gauze over the wound area. He is not in any compression. Past medical history; COPD with chronic oxygen therapy, back pain, gait instability, status post left inguinal hernia repair on 02/07/2019, chronic venous insufficiency, chronic atrial fibrillation. He is currently going to outpatient physical therapy ABIs in our clinic were 1.08 on the right 11/20 punched out area on the right medial lower leg. We have debrided this last week applied silver alginate under compression. There is no option for home health as he is going out to outpatient physical therapy. He is reluctant to use home health companies for physical therapy that he feels gets better service at an outpatient rehab facility. Looking at his left leg he clearly has changes of chronic venous insufficiency no doubt this was contributing to the swelling on the right leg last week 12/4; right medial lower leg in the setting of significant chronic venous insufficiency. Wound itself has some epithelialization coming from the medial aspect. The lateral aspect of the wound this circular wound has raised edges. We are using silver collagen under compression 12/11; right medial lower leg in the setting of significant chronic venous insufficiency. In general  slightly smaller. He is approved for Apligraf without a co-pay and we will order this for next week. I am trying to get this to close  as fast as possible. 12/18; right medial lower leg in the setting of chronic venous stasis. We applied Apligraf #1 12/31; right medial lower leg traumatic wound in the setting of chronic venous stasis. Apligraf #2. Much improved Electronic Signature(s) Signed: 08/16/2019 2:35:12 PM By: Linton Ham MD Entered By: Linton Ham on 08/16/2019 12:26:13 -------------------------------------------------------------------------------- Physical Exam Details Patient Name: Date of Service: [name], [name] 08/16/2019 10:30 AM Medical Record [id number] Patient Account Number: [id number] Date of Birth/Sex: Treating RN: 06/12/1935 (84 y.o. M) Primary Care Provider: Nicholes Stairs Other Clinician: Referring Provider: Treating Provider/Extender:Keya Wynes, Vickey Sages, Dorothe Pea in Treatment: 6 Constitutional Sitting or standing Blood Pressure is within target range for patient.. Pulse regular and within target range for patient.[name] Respirations regular, non-labored and within target range.. Temperature is normal and within the target range for the patient.[name] Appears in no distress. Notes Wound exam; right lower calf. This is just about closed small open area remains. Apligraf #2 in the standard fashion Electronic Signature(s) Signed: 08/16/2019 2:35:12 PM By: Linton Ham MD Entered By: Linton Ham on 08/16/2019 12:27:03 -------------------------------------------------------------------------------- Physician Orders Details Patient Name: Date of Service: [name], [name] 08/16/2019 10:30 AM Medical Record [id number] Patient Account Number: [id number] Date of Birth/Sex: Treating RN: 04/04/1935 (84 y.o. Lorette Ang, Meta.Reding Primary Care Provider: Nicholes Stairs Other Clinician: Referring Provider: Treating Provider/Extender:Mandy Peeks,  Vickey Sages, Dorothe Pea in Treatment: 6 Verbal / Phone Orders: No Diagnosis Coding ICD-10 Coding Code Description [phone] Chronic venous hypertension (idiopathic) with ulcer and inflammation of bilateral lower extremity L97.812 Non-pressure chronic ulcer of other part of right lower leg with fat layer exposed S80.11XD Contusion of right lower leg, subsequent encounter Follow-up Appointments Return Appointment in 2 weeks. - MD visit Nurse Visit: - next week Dressing Change Frequency Do not change entire dressing for one week. Skin Barriers/Peri-Wound Care Moisturizing lotion Wound Cleansing May shower with protection. Primary Wound Dressing Wound #1 Right Lower Leg Skin Substitute Application - Apligraft #2. secure with adaptic and steri-strips. do not remove. leave in place. Foam - foam to shin Secondary Dressing Wound #1 Right Lower Leg Dry Gauze Drawtex Edema Control 3 Layer Compression System - Right Lower Extremity Avoid standing for long periods of time Elevate legs to the level of the heart or above for 30 minutes daily and/or when sitting, a frequency of: Exercise regularly Electronic Signature(s) Signed: 08/16/2019 2:35:12 PM By: Linton Ham MD Signed: 08/16/2019 2:55:42 PM By: Deon Pilling Entered By: Deon Pilling on 08/16/2019 11:54:12 -------------------------------------------------------------------------------- Problem List Details Patient Name: Date of Service: [name] 08/16/2019 10:30 AM Medical Record [id number] Patient Account Number: [id number] Date of Birth/Sex: Treating RN: 07-12-1935 (84 y.o. Lorette Ang, Meta.Reding Primary Care Provider: Nicholes Stairs Other Clinician: Referring Provider: Treating Provider/Extender:Genaro Bekker, Vickey Sages, Dorothe Pea in Treatment: 6 Active Problems ICD-10 Evaluated Encounter Code Description Active Date Today Diagnosis I87.333 Chronic venous hypertension (idiopathic) with ulcer  07/06/2019 No Yes and inflammation of bilateral lower extremity L97.812 Non-pressure chronic ulcer of other part of right lower 06/29/2019 No Yes leg with fat layer exposed S80.11XD Contusion of right lower leg, subsequent encounter 06/29/2019 No Yes Inactive Problems Resolved Problems Electronic Signature(s) Signed: 08/16/2019 2:35:12 PM By: Linton Ham MD Entered By: Linton Ham on 08/16/2019 12:19:34 -------------------------------------------------------------------------------- Progress Note Details Patient Name: Date of Service: [name] 08/16/2019 10:30 AM Medical Record [id number] Patient Account Number: [id number] Date of Birth/Sex: Treating RN: 03-Aug-1935 (84 y.o. M) Primary Care Provider: Nicholes Stairs Other Clinician: Referring Provider: Treating  Provider/Extender:Roann Merk, Vickey Sages, Dorothe Pea in Treatment: 6 Subjective History of Present Illness (HPI) ADMISSION 06/29/2019 This is an 84 year old man who was referred here for review of the wound on the right medial lower leg. This started as trauma after he dropped his oxygen tank on his leg. By his description he had an intact hematoma that lasted for about 2 weeks and then it spontaneously opened on its own. He was seen by his primary doctor and put on antibiotic therapy on 10/21 with cephalexin 500 twice a day. He completed this recently. The patient has been placing Vaseline gauze over the wound area. He is not in any compression. Past medical history; COPD with chronic oxygen therapy, back pain, gait instability, status post left inguinal hernia repair on 02/07/2019, chronic venous insufficiency, chronic atrial fibrillation. He is currently going to outpatient physical therapy ABIs in our clinic were 1.08 on the right 11/20 punched out area on the right medial lower leg. We have debrided this last week applied silver alginate under compression. There is no option for home health as  he is going out to outpatient physical therapy. He is reluctant to use home health companies for physical therapy that he feels gets better service at an outpatient rehab facility. Looking at his left leg he clearly has changes of chronic venous insufficiency no doubt this was contributing to the swelling on the right leg last week 12/4; right medial lower leg in the setting of significant chronic venous insufficiency. Wound itself has some epithelialization coming from the medial aspect. The lateral aspect of the wound this circular wound has raised edges. We are using silver collagen under compression 12/11; right medial lower leg in the setting of significant chronic venous insufficiency. In general slightly smaller. He is approved for Apligraf without a co-pay and we will order this for next week. I am trying to get this to close as fast as possible. 12/18; right medial lower leg in the setting of chronic venous stasis. We applied Apligraf #1 12/31; right medial lower leg traumatic wound in the setting of chronic venous stasis. Apligraf #2. Much improved Objective Constitutional Sitting or standing Blood Pressure is within target range for patient.. Pulse regular and within target range for patient.[name] Respirations regular, non-labored and within target range.. Temperature is normal and within the target range for the patient.[name] Appears in no distress. Vitals Time Taken: 11:00 AM, Height: 68 in, Weight: 145 lbs, BMI: 22, Temperature: 98.6 F, Pulse: 74 bpm, Respiratory Rate: 18 breaths/min, Blood Pressure: 135/50 mmHg. General Notes: Wound exam; right lower calf. This is just about closed small open area remains. Apligraf #2 in the standard fashion Integumentary (Hair, Skin) Wound #1 status is Open. Original cause of wound was Trauma. The wound is located on the Right Lower Leg. The wound measures 1.4cm length x 0.5cm width x 0.1cm depth; 0.55cm^2 area and 0.055cm^3 volume. There is  Fat Layer (Subcutaneous Tissue) Exposed exposed. There is no tunneling or undermining noted. There is a medium amount of serosanguineous drainage noted. The wound margin is distinct with the outline attached to the wound base. There is large (67-100%) red, hyper - granulation within the wound bed. There is a small (1-33%) amount of necrotic tissue within the wound bed including Adherent Slough. Assessment Active Problems ICD-10 Chronic venous hypertension (idiopathic) with ulcer and inflammation of bilateral lower extremity Non-pressure chronic ulcer of other part of right lower leg with fat layer exposed Contusion of right lower leg, subsequent encounter Procedures Wound #  1 Pre-procedure diagnosis of Wound #1 is an Abrasion located on the Right Lower Leg. A skin graft procedure using a bioengineered skin substitute/cellular or tissue based product was performed by Ricard Dillon., MD with the following instrument(s): Blade, Forceps, and Scissors. Apligraf was applied and secured with Steri-Strips and adaptic. 11 sq cm of product was utilized and 33 sq cm was wasted due to wound size. Post Application, no dressing was applied. A Time Out was conducted at 11:50, prior to the start of the procedure. The procedure was tolerated well with a pain level of 0 throughout and a pain level of 0 following the procedure. Post procedure Diagnosis Wound #1: Same as Pre-Procedure . Pre-procedure diagnosis of Wound #1 is an Abrasion located on the Right Lower Leg . There was a Three Layer Compression Therapy Procedure with a pre-treatment ABI of 1.1 by Baruch Gouty, RN. Post procedure Diagnosis Wound #1: Same as Pre-Procedure Plan Follow-up Appointments: Return Appointment in 2 weeks. - MD visit Nurse Visit: - next week Dressing Change Frequency: Do not change entire dressing for one week. Skin Barriers/Peri-Wound Care: Moisturizing lotion Wound Cleansing: May shower with protection. Primary  Wound Dressing: Wound #1 Right Lower Leg: Skin Substitute Application - Apligraft #2. secure with adaptic and steri-strips. do not remove. leave in place. Foam - foam to shin Secondary Dressing: Wound #1 Right Lower Leg: Dry Gauze Drawtex Edema Control: 3 Layer Compression System - Right Lower Extremity Avoid standing for long periods of time Elevate legs to the level of the heart or above for 30 minutes daily and/or when sitting, a frequency of: Exercise regularly 1. Apligraf #2 in the standard fashion should be closed by the next time we see him Electronic Signature(s) Signed: 08/16/2019 2:35:12 PM By: Linton Ham MD Signed: 08/16/2019 2:55:42 PM By: Deon Pilling Entered By: Deon Pilling on 08/16/2019 14:09:04 -------------------------------------------------------------------------------- SuperBill Details Patient Name: Date of Service: [name], [name] 08/16/2019 Medical Record [id number] Patient Account Number: [id number] Date of Birth/Sex: Treating RN: [birth year] (84 y.o. Lorette Ang, Tammi Klippel Primary Care Provider: Nicholes Stairs Other Clinician: Referring Provider: Treating Provider/Extender:Kitzia Camus, Vickey Sages, Dorothe Pea in Treatment: 6 Diagnosis Coding ICD-10 Codes Code Description [phone] Chronic venous hypertension (idiopathic) with ulcer and inflammation of bilateral lower extremity L97.812 Non-pressure chronic ulcer of other part of right lower leg with fat layer exposed S80.11XD Contusion of right lower leg, subsequent encounter Facility Procedures CPT4 Code Description: [id number] (Facility Use Only) Apligraf 1 SQ CM Modifier: Quantity: 80 CPT4 Code Description: [id number] - SKIN SUB GRAFT TRNK/ARM/LEG ICD-10 Diagnosis Description L97.812 Non-pressure chronic ulcer of other part of right lower le Modifier: g with fat lay Quantity: 1 er exposed Physician Procedures CPT4 Code Description: [id number] - WC PHYS SKIN SUB GRAFT TRNK/ARM/LEG  ICD-10 Diagnosis Description L97.812 Non-pressure chronic ulcer of other part of right lower leg Modifier: with fat lay Quantity: 1 er exposed Electronic Signature(s) Signed: 08/16/2019 2:35:12 PM By: Linton Ham MD Signed: 08/16/2019 2:55:42 PM By: Deon Pilling Entered By: Deon Pilling on 08/16/2019 14:09:14

## 2019-08-20 NOTE — Progress Notes (Signed)
[name], [name] ([id number]) Visit Report for 08/16/2019 Arrival Information Details Patient Name: Date of Service: [name], [name] 08/16/2019 10:30 AM Medical Record [id number] Patient Account Number: [id number] Date of Birth/Sex: Treating RN: [birth year] (85 y.o. [name] Primary Care [name]: [name] Other Clinician: Referring Yamilex Borgwardt: Treating [name]/Extender:[name], [name] in Treatment: 6 Visit Information History Since Last Visit Walker Added or deleted any medications: No Patient Arrived: Any new allergies or adverse reactions: No Arrival Time: 10:57 Had a fall or experienced change in No Accompanied By: alone activities of daily living that may affect Transfer Assistance: None risk of falls: Patient Identification Verified: Yes Signs or symptoms of abuse/neglect since last No Secondary Verification Process Completed: Yes visito Patient Requires Transmission-Based No Hospitalized since last visit: No Precautions: Implantable device outside of the clinic excluding No Patient Has Alerts: No cellular tissue based products placed in the center since last visit: Has Dressing in Place as Prescribed: Yes Has Compression in Place as Prescribed: Yes Pain Present Now: No Electronic Signature(s) Signed: 08/16/2019 3:10:57 PM By: [name] Hurst RN, BSN Entered By: [name] on 08/16/2019 10:58:18 -------------------------------------------------------------------------------- Compression Therapy Details Patient Name: Date of Service: [name] 08/16/2019 10:30 AM Medical Record [id number] Patient Account Number: [id number] Date of Birth/Sex: Treating RN: September 18, 1934 (85 y.o. [name] Primary Care Lailynn Southgate: [name] Other Clinician: Referring Arias Weinert: Treating Keelyn Monjaras/Extender:[name], [name] in Treatment: 6 Compression Therapy Performed for Wound Wound #1  Right Lower Leg Assessment: Performed By: Clinician Baruch Gouty, RN Compression Type: Three Layer Pre Treatment ABI: 1.1 Post Procedure Diagnosis Same as Pre-procedure Electronic Signature(s) Signed: 08/16/2019 2:55:42 PM By: [name] Entered By: [name] on 08/16/2019 11:54:47 -------------------------------------------------------------------------------- Encounter Discharge Information Details Patient Name: Date of Service: [name], [name] 08/16/2019 10:30 AM Medical Record [id number] Patient Account Number: [id number] Date of Birth/Sex: Treating RN: 1934/11/13 (85 y.o. [name] Primary Care Debanhi Blaker: [name] Other Clinician: Referring Anayah Arvanitis: Treating Lindaann Gradilla/Extender:[name], [name] in Treatment: 6 Encounter Discharge Information Items Post Procedure Vitals Discharge Condition: Stable Temperature (F): 98.6 Ambulatory Status: Walker Pulse (bpm): 74 Discharge Destination: Home Respiratory Rate (breaths/min): 18 Transportation: Private Auto Blood Pressure (mmHg): 135/50 Accompanied By: self Schedule Follow-up Appointment: Yes Clinical Summary of Care: Patient Declined Electronic Signature(s) Signed: 08/20/2019 4:32:10 PM By: [name] Entered By: [name] on 08/16/2019 12:14:10 -------------------------------------------------------------------------------- Lower Extremity Assessment Details Patient Name: Date of Service: [name], [name] 08/16/2019 10:30 AM Medical Record [id number] Patient Account Number: [id number] Date of Birth/Sex: Treating RN: 02/21/1935 (85 y.o. [name] Primary Care Mikella Linsley: [name] Other Clinician: Referring Kaylynn Chamblin: Treating [name]/Extender:[name], [name] in Treatment: 6 Edema Assessment Assessed: [Left: No] [Right: No] Edema: [Left: Ye] [Right: s] Calf Left: Right: Point of Measurement: 39 cm From Medial  Instep cm 29 cm Ankle Left: Right: Point of Measurement: 10 cm From Medial Instep cm 20.4 cm Vascular Assessment Pulses: Dorsalis Pedis Palpable: [Right:Yes] Electronic Signature(s) Signed: 08/16/2019 3:10:57 PM By: [name] Hurst RN, BSN Entered By: [name] on 08/16/2019 11:07:47 -------------------------------------------------------------------------------- Multi Wound Chart Details Patient Name: Date of Service: [name] 08/16/2019 10:30 AM Medical Record [id number] Patient Account Number: [id number] Date of Birth/Sex: Treating RN: [birth year] (85 y.o. M) Primary Care Daune Colgate: [name] Other Clinician: Referring [name] Lemberger: Treating [name]/Extender:[name], [name] in Treatment: 6 Vital Signs Height(in): 68 Pulse(bpm): 74 Weight(lbs): 145 Blood Pressure(mmHg): 135/50 Body Mass Index(BMI): 22  Temperature(F): 98.6 Respiratory 18 Rate(breaths/min): Photos: [1:No Photos] [N/A:N/A] Wound Location: [1:Right Lower Leg] [N/A:N/A] Wounding Event: [1:Trauma] [N/A:N/A] Primary Etiology: [1:Abrasion] [N/A:N/A] Comorbid History: [1:Chronic Obstructive Pulmonary Disease (COPD), Hypertension, Neuropathy] [N/A:N/A] Date Acquired: [1:05/17/2019] [N/A:N/A] Weeks of Treatment: [1:6] [N/A:N/A] Wound Status: [1:Open] [N/A:N/A] Measurements L x W x D 1.4x0.5x0.1 [N/A:N/A] (cm) Area (cm) : [1:0.55] [N/A:N/A] Volume (cm) : [1:0.055] [N/A:N/A] % Reduction in Area: [1:91.20%] [N/A:N/A] % Reduction in Volume: [1:98.50%] [N/A:N/A] Classification: [1:Full Thickness Without Exposed Support Structures] [N/A:N/A] Exudate Amount: [1:Medium] [N/A:N/A] Exudate Type: [1:Serosanguineous] [N/A:N/A] Exudate Color: [1:red, brown] [N/A:N/A] Wound Margin: [1:Distinct, outline attached N/A] Granulation Amount: [1:Large (67-100%)] [N/A:N/A] Granulation Quality: [1:Red, Hyper-granulation] [N/A:N/A] Necrotic Amount: [1:Small (1-33%)]  [N/A:N/A] Exposed Structures: [1:Fat Layer (Subcutaneous N/A Tissue) Exposed: Yes Fascia: No Tendon: No Muscle: No Joint: No Bone: No] Epithelialization: [1:Medium (34-66%)] [N/A:N/A] Procedures Performed: [1:Cellular or Tissue Based N/A Product Compression Therapy] Treatment Notes Wound #1 (Right Lower Leg) 1. Cleanse With Wound Cleanser 3. Primary Dressing Applied Cellular Based Tissue Product 4. Secondary Dressing Dry Gauze Drawtex 6. Support Layer Applied 3 layer compression wrap Notes netting Electronic Signature(s) Signed: 08/16/2019 2:35:12 PM By: Linton Ham MD Entered By: Linton Ham on 08/16/2019 12:19:41 -------------------------------------------------------------------------------- Multi-Disciplinary Care Plan Details Patient Name: Date of Service: [name], [name] 08/16/2019 10:30 AM Medical Record [id number] Patient Account Number: [id number] Date of Birth/Sex: Treating RN: 1934-10-26 (85 y.o. Lorette Ang, Tammi Klippel Primary Care Mosetta Ferdinand: [name] Other Clinician: Referring Berneita Sanagustin: Treating Nicko Daher/Extender:[name], [name] in Treatment: 6 Active Inactive Necrotic Tissue Nursing Diagnoses: Impaired tissue integrity related to necrotic/devitalized tissue Goals: Necrotic/devitalized tissue will be minimized in the wound bed Date Initiated: 06/29/2019 Target Resolution Date: 08/24/2019 Goal Status: Active Interventions: Provide education on necrotic tissue and debridement process Notes: Wound/Skin Impairment Nursing Diagnoses: Impaired tissue integrity Knowledge deficit related to smoking impact on wound healing Goals: Ulcer/skin breakdown will have a volume reduction of 30% by week 4 Date Initiated: 06/29/2019 Target Resolution Date: 08/24/2019 Goal Status: Active Interventions: Provide education on ulcer and skin care Notes: Electronic Signature(s) Signed: 08/16/2019 2:55:42 PM By: [name] Entered By:  [name] on 08/16/2019 11:36:45 -------------------------------------------------------------------------------- Pain Assessment Details Patient Name: Date of Service: JONMARC, KOLKMAN 08/16/2019 10:30 AM Medical Record [id number] Patient Account Number: [id number] Date of Birth/Sex: Treating RN: 1934/09/05 (85 y.o. [name] Primary Care Haya Hemler: [name] Other Clinician: Referring Alveta Quintela: Treating Athelene Hursey/Extender:[name], [name] in Treatment: 6 Active Problems Location of Pain Severity and Description of Pain Patient Has Paino No Site Locations Pain Management and Medication Current Pain Management: Electronic Signature(s) Signed: 08/16/2019 3:10:57 PM By: [name] Hurst RN, BSN Entered By: [name] on 08/16/2019 11:07:35 -------------------------------------------------------------------------------- Patient/Caregiver Education Details Patient Name: [name] 12/31/2020andnbsp10:30 Date of Service: AM Medical Record [id number] Number: Patient Account Number: [id number] Treating RN: 07-Jan-1935 (84 y.o. [name] Date of Birth/Gender: M) Other Clinician: Primary Care Physician: [name] Treating Linton Ham Referring Physician: Physician/Extender: Deliah Boston in Treatment: 6 Education Assessment Education Provided To: Patient Education Topics Provided Wound Debridement: Handouts: Wound Debridement Methods: Explain/Verbal Responses: Reinforcements needed Electronic Signature(s) Signed: 08/16/2019 2:55:42 PM By: [name] Entered By: [name] on 08/16/2019 11:36:57 -------------------------------------------------------------------------------- Wound Assessment Details Patient Name: Date of Service: KALEP, SUMMERTON 08/16/2019 10:30 AM Medical Record [id number] Patient Account Number: [id number] Date of Birth/Sex: Treating RN: 11-Sep-1934 (85 y.o. [name] Primary Care Natalie Leclaire: [name] Other Clinician: Referring Jalilah Wiltsie: Treating Antoinetta Berrones/Extender:[name], Lennette Bihari  L Weeks in Treatment: 6 Wound Status Wound Number: 1 Primary Abrasion Etiology: Wound Location: Right Lower Leg Wound Open Wounding Event: Trauma Status: Date Acquired: 05/17/2019 Comorbid Chronic Obstructive Pulmonary Disease Weeks Of Treatment: 6 History: (COPD), Hypertension, Neuropathy Clustered Wound: No Wound Measurements Length: (cm) 1.4 % Reduc Width: (cm) 0.5 % Reduc Depth: (cm) 0.1 Epithel Area: (cm) 0.55 Tunnel Volume: (cm) 0.055 Underm Wound Description Classification: Full Thickness Without Exposed Support Structures Wound Distinct, outline attached Margin: Exudate Medium Amount: Exudate Serosanguineous Type: Exudate red, brown Color: Wound Bed Granulation Amount: Large (67-100%) Granulation Quality: Red, Hyper-granulation Necrotic Amount: Small (1-33%) Necrotic Quality: Adherent Slough Foul Odor After Cleansing: No Slough/Fibrino Yes Exposed Structure Fascia Exposed: No Fat Layer (Subcutaneous Tissue) Exposed: Yes Tendon Exposed: No Muscle Exposed: No Joint Exposed: No Bone Exposed: No tion in Area: 91.2% tion in Volume: 98.5% ialization: Medium (34-66%) ing: No ining: No Treatment Notes Wound #1 (Right Lower Leg) 1. Cleanse With Wound Cleanser 3. Primary Dressing Applied Cellular Based Tissue Product 4. Secondary Dressing Dry Gauze Drawtex 6. Support Layer Applied 3 layer compression wrap Notes netting Electronic Signature(s) Signed: 08/16/2019 3:10:57 PM By: [name] Hurst RN, BSN Entered By: [name] on 08/16/2019 11:07:01 -------------------------------------------------------------------------------- Vitals Details Patient Name: Date of Service: [name] 08/16/2019 10:30 AM Medical Record [id number] Patient Account Number: [id number] Date of  Birth/Sex: Treating RN: 08-10-1935 (84 y.o. [name] Primary Care Bona Hubbard: [name] Other Clinician: Referring Areta Terwilliger: Treating Jerzey Komperda/Extender:[name], [name] in Treatment: 6 Vital Signs Time Taken: 11:00 Temperature (F): 98.6 Height (in): 68 Pulse (bpm): 74 Weight (lbs): 145 Respiratory Rate (breaths/min): 18 Body Mass Index (BMI): 22 Blood Pressure (mmHg): 135/50 Reference Range: 80 - 120 mg / dl Electronic Signature(s) Signed: 08/16/2019 3:10:57 PM By: [name] Hurst RN, BSN Entered By: [name] on 08/16/2019 11:07:30

## 2019-08-21 ENCOUNTER — Encounter: Payer: Self-pay | Admitting: Hematology

## 2019-08-24 ENCOUNTER — Encounter (HOSPITAL_BASED_OUTPATIENT_CLINIC_OR_DEPARTMENT_OTHER): Payer: Medicare Other | Attending: Internal Medicine | Admitting: Internal Medicine

## 2019-08-24 ENCOUNTER — Other Ambulatory Visit: Payer: Self-pay

## 2019-08-24 DIAGNOSIS — I482 Chronic atrial fibrillation, unspecified: Secondary | ICD-10-CM | POA: Diagnosis not present

## 2019-08-24 DIAGNOSIS — I87333 Chronic venous hypertension (idiopathic) with ulcer and inflammation of bilateral lower extremity: Secondary | ICD-10-CM | POA: Diagnosis not present

## 2019-08-24 DIAGNOSIS — L97812 Non-pressure chronic ulcer of other part of right lower leg with fat layer exposed: Secondary | ICD-10-CM | POA: Diagnosis not present

## 2019-08-24 DIAGNOSIS — J449 Chronic obstructive pulmonary disease, unspecified: Secondary | ICD-10-CM | POA: Diagnosis not present

## 2019-08-24 DIAGNOSIS — I10 Essential (primary) hypertension: Secondary | ICD-10-CM | POA: Insufficient documentation

## 2019-08-24 DIAGNOSIS — G629 Polyneuropathy, unspecified: Secondary | ICD-10-CM | POA: Diagnosis not present

## 2019-08-27 NOTE — Progress Notes (Signed)
EEAN, MCCARRICK ([id number]) Visit Report for 08/24/2019 Arrival Information Details Patient Name: Date of Service: [name], [name] 08/24/2019 10:45 AM Medical Record [id number] Patient Account Number: [id number] Date of Birth/Sex: Treating RN: June 20, 1935 (85 y.o. [name]) Carlene Coria Primary Care Almira P[place]: Nicholes Stairs Other Clinician: Referring Nahom Carfagno: Treating Dyneisha Murchison/Extender:Robson, Vickey Sages, Dorothe Pea in Treatment: 8 Visit Information History Since Last Visit All ordered tests and consults were completed: No Patient Arrived: Gilford Rile Added or deleted any medications: No Arrival Time: 10:44 caregiver Any new allergies or adverse reactions: No Accompanied By: Had a fall or experienced change in No Transfer Assistance: None activities of daily living that may affect Patient Identification Verified: Yes risk of falls: Secondary Verification Process Completed: Yes Signs or symptoms of abuse/neglect since last No Patient Requires Transmission-Based No visito Precautions: Hospitalized since last visit: No Patient Has Alerts: No Implantable device outside of the clinic excluding No cellular tissue based products placed in the center since last visit: Has Dressing in Place as Prescribed: Yes Has Compression in Place as Prescribed: Yes Pain Present Now: No Electronic Signature(s) Signed: 08/27/2019 6:08:38 PM By: Carlene Coria RN Entered By: Carlene Coria on 08/24/2019 10:50:17 -------------------------------------------------------------------------------- Compression Therapy Details Patient Name: Date of Service: [name], [name] 08/24/2019 10:45 AM Medical Record [id number] Patient Account Number: [id number] Date of Birth/Sex: Treating RN: 01/16/1935 (85 y.o. Oval Linsey Primary Care Jamille Fisher: Nicholes Stairs Other Clinician: Referring Arnika Larzelere: Treating Dietra Stokely/Extender:Robson, Vickey Sages, Dorothe Pea in Treatment: 8 Compression  Therapy Performed for Wound Wound #1 Right Lower Leg Assessment: Performed By: Jake Church, RN Compression Type: Three Layer Electronic Signature(s) Signed: 08/27/2019 6:08:38 PM By: Carlene Coria RN Entered By: Carlene Coria on 08/24/2019 11:03:10 -------------------------------------------------------------------------------- Encounter Discharge Information Details Patient Name: Date of Service: [name], [name] 08/24/2019 10:45 AM Medical Record [id number] Patient Account Number: [id number] Date of Birth/Sex: Treating RN: 08-Jul-1935 (85 y.o. [name]) Carlene Coria Primary Care Junetta Hearn: Nicholes Stairs Other Clinician: Referring Kahealani Yankovich: Treating Shavaughn Seidl/Extender:Robson, Vickey Sages, Dorothe Pea in Treatment: 8 Encounter Discharge Information Items Discharge Condition: Stable Ambulatory Status: Walker Discharge Destination: Home Transportation: Private Auto Accompanied By: caregiver Schedule Follow-up Appointment: Yes Clinical Summary of Care: Patient Declined Electronic Signature(s) Signed: 08/27/2019 6:08:38 PM By: Carlene Coria RN Entered By: Carlene Coria on 08/27/2019 13:14:47 -------------------------------------------------------------------------------- Patient/Caregiver Education Details Patient Name: Date of Service: [name] 1/8/2021andnbsp10:45 AM Medical Record [id number] Patient Account Number: [id number] Date of Birth/Gender: Treating RN: [birth year] (85 y.o. [name]) Carlene Coria Primary Care Physician: Nicholes Stairs Other Clinician: Referring Physician: Treating Physician/Extender:Robson, Vickey Sages, Dorothe Pea in Treatment: 8 Education Assessment Education Provided To: Patient Education Topics Provided Wound/Skin Impairment: Methods: Explain/Verbal Responses: State content correctly Electronic Signature(s) Signed: 08/27/2019 6:08:38 PM By: Carlene Coria RN Entered By: Carlene Coria on 08/27/2019  13:14:32 -------------------------------------------------------------------------------- Wound Assessment Details Patient Name: Date of Service: [name], [name] 08/24/2019 10:45 AM Medical Record [id number] Patient Account Number: [id number] Date of Birth/Sex: Treating RN: [birth year] (85 y.o. [name]) Carlene Coria Primary Care Aquil Duhe: Nicholes Stairs Other Clinician: Referring Jodeci Roarty: Treating Icyss Skog/Extender:Robson, Vickey Sages, Dorothe Pea in Treatment: 8 Wound Status Wound Number: 1 Primary Abrasion Etiology: Wound Location: Right Lower Leg Wound Open Wounding Event: Trauma Status: Date Acquired: 05/17/2019 Comorbid Chronic Obstructive Pulmonary Disease Weeks Of Treatment: 8 History: (COPD), Hypertension, Neuropathy Clustered Wound: No Wound Measurements Length: (cm) 1.4 % Reduct Width: (cm) 0.5 % Reduct Depth: (cm) 0.1 Epitheli Area: (cm) 0.55 Tunneli Volume: (cm) 0.055 Undermi Wound Description Classification:  Full Thickness Without Exposed Support Foul Od Structures Slough/ Wound Distinct, outline attached Margin: Exudate Medium Amount: Exudate Serosanguineous Type: Exudate red, brown Color: Wound Bed Granulation Amount: Large (67-100%) Granulation Quality: Red, Hyper-granulation Fascia E Necrotic Amount: Small (1-33%) Fat Laye Necrotic Quality: Adherent Slough Tendon E Muscle E Joint Ex Bone Exp Treatment Notes Wound #1 (Right Lower Leg) 1. Cleanse With Soap and water 4. Secondary Dressing Foam 6. Support Layer Applied 3 layer compression wrap Notes apilgraft/ adaptic/ Horticulturist, commercial) Signed: 08/27/2019 6:08:38 PM By: Carlene Coria RN Entered By: Carlene Coria on 01 or After Cleansing: No Fibrino Yes Exposed Structure xposed: No r (Subcutaneous Tissue) Exposed: Yes xposed: No xposed: No posed: No osed: No /03/2020 11:02:36 ion in Area: 91.2% ion in Volume: 98.5% alization: Medium  (34-66%) ng: No ning: No -------------------------------------------------------------------------------- Vitals Details Patient Name: Date of Service: [name], [name] 08/24/2019 10:45 AM Medical Record [id number] Patient Account Number: [id number] Date of Birth/Sex: Treating RN: [birth year] (85 y.o. [name]) Carlene Coria Primary Care Floride Hutmacher: Nicholes Stairs Other Clinician: Referring Quanisha Drewry: Treating Zohar Laing/Extender:Robson, Vickey Sages, Dorothe Pea in Treatment: 8 Vital Signs Time Taken: 10:50 Temperature (F): 98.3 Height (in): 68 Pulse (bpm): 69 Weight (lbs): 145 Respiratory Rate (breaths/min): 18 Body Mass Index (BMI): 22 Blood Pressure (mmHg): 154/86 Reference Range: 80 - 120 mg / dl Electronic Signature(s) Signed: 08/27/2019 6:08:38 PM By: Carlene Coria RN Entered By: Carlene Coria on 08/24/2019 10:50:44

## 2019-08-27 NOTE — Progress Notes (Signed)
WANG, TAKASHIMA ([id number]) Visit Report for 08/24/2019 SuperBill Details Patient Name: Date of Service: [place], LABARCA 08/24/2019 Medical Record [id number] Patient Account Number: [id number] Date of Birth/Sex: Treating RN: 05-Sep-1934 (84 y.o. [name]) Carlene Coria Primary Care Provider: Nicholes Stairs Other Clinician: Referring Provider: Treating Provider/Extender:Delayza Lungren, Vickey Sages, Dorothe Pea in Treatment: 8 Diagnosis Coding ICD-10 Codes Code Description [phone] Chronic venous hypertension (idiopathic) with ulcer and inflammation of bilateral lower extremity L97.812 Non-pressure chronic ulcer of other part of right lower leg with fat layer exposed S80.11XD Contusion of right lower leg, subsequent encounter Facility Procedures CPT4 Code Description Modifier Quantity [id number] (Facility Use Only) [phone] - APPLY MULTLAY COMPRS LWR RT LEG 1 Electronic Signature(s) Signed: 08/27/2019 6:04:26 PM By: Linton Ham MD Signed: 08/27/2019 6:08:38 PM By: Carlene Coria RN Entered By: Carlene Coria on 08/27/2019 13:15:03

## 2019-08-28 ENCOUNTER — Telehealth (HOSPITAL_COMMUNITY): Payer: Self-pay | Admitting: *Deleted

## 2019-08-28 ENCOUNTER — Telehealth: Payer: Self-pay | Admitting: Internal Medicine

## 2019-08-28 DIAGNOSIS — J9611 Chronic respiratory failure with hypoxia: Secondary | ICD-10-CM

## 2019-08-28 NOTE — Telephone Encounter (Signed)
Pt aware request made to Dr. Annamaria Boots for new referral and diagnosis is needed to proceed with scheduling. Cherre Huger, BSN Cardiac and Training and development officer

## 2019-08-28 NOTE — Telephone Encounter (Signed)
Called back Carlette with pulmonary rehab. She stated that due to insurance the code used for COPD J44.9 can only be used for up to 72 visits. It is at that max and based on this the code for chronic respiratory failure with hypoxia that was one of the diagnosis used during his last visit with Dr. Annamaria Boots on 07/23/19 can be applied.   Order for pulmonary rehab resubmitted with corrected diagnosis so care can be provided to the patient. Nothing further needed at this time.

## 2019-08-28 NOTE — Telephone Encounter (Signed)
Received updated referral for pulmonary rehab with the diagnosis of Resp failure with hypoxia.Hulen Skains and spoke to pt regarding  for Virtual Pulmonary rehab and scheduling.  Pt stated that he would rather wait until he could come to the department for exercise. Advised pt that right now we do not have an intended date it will depend on the continued need for deployment of staff to areas of the hospital that are in need.  Pt verbalized understanding. Cherre Huger, BSN Cardiac and Training and development officer

## 2019-08-31 ENCOUNTER — Encounter (HOSPITAL_BASED_OUTPATIENT_CLINIC_OR_DEPARTMENT_OTHER): Payer: Medicare Other | Admitting: Internal Medicine

## 2019-08-31 ENCOUNTER — Other Ambulatory Visit: Payer: Self-pay

## 2019-08-31 DIAGNOSIS — I87333 Chronic venous hypertension (idiopathic) with ulcer and inflammation of bilateral lower extremity: Secondary | ICD-10-CM | POA: Diagnosis not present

## 2019-08-31 DIAGNOSIS — S81801A Unspecified open wound, right lower leg, initial encounter: Secondary | ICD-10-CM | POA: Diagnosis not present

## 2019-08-31 DIAGNOSIS — I872 Venous insufficiency (chronic) (peripheral): Secondary | ICD-10-CM | POA: Diagnosis not present

## 2019-08-31 DIAGNOSIS — I10 Essential (primary) hypertension: Secondary | ICD-10-CM | POA: Diagnosis not present

## 2019-08-31 DIAGNOSIS — L97812 Non-pressure chronic ulcer of other part of right lower leg with fat layer exposed: Secondary | ICD-10-CM | POA: Diagnosis not present

## 2019-08-31 DIAGNOSIS — J449 Chronic obstructive pulmonary disease, unspecified: Secondary | ICD-10-CM | POA: Diagnosis not present

## 2019-08-31 DIAGNOSIS — I482 Chronic atrial fibrillation, unspecified: Secondary | ICD-10-CM | POA: Diagnosis not present

## 2019-08-31 DIAGNOSIS — G629 Polyneuropathy, unspecified: Secondary | ICD-10-CM | POA: Diagnosis not present

## 2019-08-31 NOTE — Progress Notes (Addendum)
[name], [name] ([id number]) Visit Report for 08/31/2019 Arrival Information Details Patient Name: Date of Service: [name], [name] 08/31/2019 10:30 AM Medical Record [id number] Patient Account Number: [id number] Date of Birth/Sex: Treating RN: 1935/07/28 (85 y.o. [name] Primary Care Philemon Riedesel: Nicholes Stairs Other Clinician: Referring Tammy Ericsson: Treating Debbora Ang/Extender:Robson, Vickey Sages, Dorothe Pea in Treatment: 9 Visit Information History Since Last Visit Added or deleted any medications: No Patient Arrived: Gilford Rile Any new allergies or adverse reactions: No Arrival Time: 12:04 caregiver Had a fall or experienced change in No Accompanied By: activities of daily living that may affect Transfer Assistance: None risk of falls: Patient Identification Verified: Yes Signs or symptoms of abuse/neglect since last No Secondary Verification Process Completed: Yes visito Patient Requires Transmission-Based No Hospitalized since last visit: No Precautions: Implantable device outside of the clinic excluding No Patient Has Alerts: No cellular tissue based products placed in the center since last visit: Has Dressing in Place as Prescribed: Yes Has Compression in Place as Prescribed: Yes Pain Present Now: No Electronic Signature(s) Signed: 08/31/2019 5:57:08 PM By: Kela Millin Entered By: Kela Millin on 08/31/2019 12:04:43 -------------------------------------------------------------------------------- Clinic Level of Care Assessment Details Patient Name: Date of Service: [name], [name] 08/31/2019 10:30 AM Medical Record [id number] Patient Account Number: [id number] Date of Birth/Sex: Treating RN: 06-Oct-1934 (85 y.o. Ernestene Mention Primary Care Analina Filla: Nicholes Stairs Other Clinician: Referring Saachi Zale: Treating Olina Melfi/Extender:Robson, Vickey Sages, Dorothe Pea in Treatment: 9 Clinic Level of Care Assessment  Items TOOL 4 Quantity Score []  - Use when only an EandM is performed on FOLLOW-UP visit 0 ASSESSMENTS - Nursing Assessment / Reassessment X - Reassessment of Co-morbidities (includes updates in patient status) 1 10 X - Reassessment of Adherence to Treatment Plan 1 5 ASSESSMENTS - Wound and Skin Assessment / Reassessment X - Simple Wound Assessment / Reassessment - one wound 1 5 []  - Complex Wound Assessment / Reassessment - multiple wounds 0 []  - Dermatologic / Skin Assessment (not related to wound area) 0 ASSESSMENTS - Focused Assessment []  - Circumferential Edema Measurements - multi extremities 0 []  - Nutritional Assessment / Counseling / Intervention 0 X - Lower Extremity Assessment (monofilament, tuning fork, pulses) 1 5 []  - Peripheral Arterial Disease Assessment (using hand held doppler) 0 ASSESSMENTS - Ostomy and/or Continence Assessment and Care []  - Incontinence Assessment and Management 0 []  - Ostomy Care Assessment and Management (repouching, etc.) 0 PROCESS - Coordination of Care X - Simple Patient / Family Education for ongoing care 1 15 []  - Complex (extensive) Patient / Family Education for ongoing care 0 X - Staff obtains Programmer, systems, Records, Test Results / Process Orders 1 10 []  - Staff telephones HHA, Nursing Homes / Clarify orders / etc 0 []  - Routine Transfer to another Facility (non-emergent condition) 0 []  - Routine Hospital Admission (non-emergent condition) 0 []  - New Admissions / Biomedical engineer / Ordering NPWT, Apligraf, etc. 0 []  - Emergency Hospital Admission (emergent condition) 0 X - Simple Discharge Coordination 1 10 []  - Complex (extensive) Discharge Coordination 0 PROCESS - Special Needs []  - Pediatric / Minor Patient Management 0 []  - Isolation Patient Management 0 []  - Hearing / Language / Visual special needs 0 []  - Assessment of Community assistance (transportation, D/C planning, etc.) 0 []  - Additional assistance / Altered mentation  0 []  - Support Surface(s) Assessment (bed, cushion, seat, etc.) 0 INTERVENTIONS - Wound Cleansing / Measurement X - Simple Wound Cleansing - one wound 1 5 []  - Complex Wound  Cleansing - multiple wounds 0 X - Wound Imaging (photographs - any number of wounds) 1 5 []  - Wound Tracing (instead of photographs) 0 X - Simple Wound Measurement - one wound 1 5 []  - Complex Wound Measurement - multiple wounds 0 INTERVENTIONS - Wound Dressings []  - Small Wound Dressing one or multiple wounds 0 []  - Medium Wound Dressing one or multiple wounds 0 []  - Large Wound Dressing one or multiple wounds 0 []  - Application of Medications - topical 0 []  - Application of Medications - injection 0 INTERVENTIONS - Miscellaneous []  - External ear exam 0 []  - Specimen Collection (cultures, biopsies, blood, body fluids, etc.) 0 []  - Specimen(s) / Culture(s) sent or taken to Lab for analysis 0 []  - Patient Transfer (multiple staff / Civil Service fast streamer / Similar devices) 0 []  - Simple Staple / Suture removal (25 or less) 0 []  - Complex Staple / Suture removal (26 or more) 0 []  - Hypo / Hyperglycemic Management (close monitor of Blood Glucose) 0 []  - Ankle / Brachial Index (ABI) - do not check if billed separately 0 X - Vital Signs 1 5 Has the patient been seen at the hospital within the last three years: Yes Total Score: 80 Level Of Care: New/Established - Level 3 Electronic Signature(s) Signed: 08/31/2019 6:26:16 PM By: Baruch Gouty RN, BSN Entered By: Baruch Gouty on 08/31/2019 12:43:07 -------------------------------------------------------------------------------- Encounter Discharge Information Details Patient Name: Date of Service: [name] Grice 08/31/2019 10:30 AM Medical Record [id number] Patient Account Number: [id number] Date of Birth/Sex: Treating RN: 12/02/1934 (85 y.o. Ernestene Mention Primary Care [name]: Nicholes Stairs Other Clinician: Referring Haylen Shelnutt: Treating  [name]/Extender:Robson, Vickey Sages, Dorothe Pea in Treatment: 9 Encounter Discharge Information Items Discharge Condition: Stable Ambulatory Status: Cane Discharge Destination: Home Transportation: Private Auto Accompanied By: caregiver Schedule Follow-up Appointment: Yes Clinical Summary of Care: Patient Declined Electronic Signature(s) Signed: 08/31/2019 6:26:16 PM By: Baruch Gouty RN, BSN Entered By: Baruch Gouty on 08/31/2019 17:51:03 -------------------------------------------------------------------------------- Lower Extremity Assessment Details Patient Name: Date of Service: JIMARI, POMRENKE 08/31/2019 10:30 AM Medical Record [id number] Patient Account Number: [id number] Date of Birth/Sex: Treating RN: 16-Dec-1934 (84 y.o. [name] Primary Care Dennard Vezina: Nicholes Stairs Other Clinician: Referring Vernal Hritz: Treating Araseli Sherry/Extender:Robson, Vickey Sages, Dorothe Pea in Treatment: 9 Edema Assessment Assessed: [Left: No] [Right: No] Edema: [Left: N] [Right: o] Calf Left: Right: Point of Measurement: 39 cm From Medial Instep cm 30 cm Ankle Left: Right: Point of Measurement: 10 cm From Medial Instep cm 20.5 cm Vascular Assessment Pulses: Dorsalis Pedis Palpable: [Right:Yes] Electronic Signature(s) Signed: 08/31/2019 5:57:08 PM By: Kela Millin Entered By: Kela Millin on 08/31/2019 12:11:06 -------------------------------------------------------------------------------- Multi Wound Chart Details Patient Name: Date of Service: [name] Grice 08/31/2019 10:30 AM Medical Record [id number] Patient Account Number: [id number] Date of Birth/Sex: Treating RN: Oct 19, 1934 (85 y.o. M) Primary Care Journee Kohen: Nicholes Stairs Other Clinician: Referring Emmalie Haigh: Treating Draylon Mercadel/Extender:Robson, Vickey Sages, Dorothe Pea in Treatment: 9 Vital Signs Height(in): 68 Pulse(bpm): 72 Weight(lbs): 145 Blood  Pressure(mmHg): 149/53 Body Mass Index(BMI): 22 Temperature(F): 98.3 Respiratory 19 Rate(breaths/min): Photos: [1:No Photos] [N/A:N/A] Wound Location: [1:Right Lower Leg] [N/A:N/A] Wounding Event: [1:Trauma] [N/A:N/A] Primary Etiology: [1:Abrasion] [N/A:N/A] Comorbid History: [1:Chronic Obstructive Pulmonary Disease (COPD), Hypertension, Neuropathy] [N/A:N/A] Date Acquired: [1:05/17/2019] [N/A:N/A] Weeks of Treatment: [1:9] [N/A:N/A] Wound Status: [1:Open] [N/A:N/A] Measurements L x W x D 0x0x0 [N/A:N/A] (cm) Area (cm) : [1:0] [N/A:N/A] Volume (cm) : [1:0] [N/A:N/A] % Reduction in Area: [1:100.00%] [N/A:N/A] % Reduction in Volume: 100.00% [  N/A:N/A] Classification: [1:Full Thickness Without Exposed Support Structures] [N/A:N/A] Exudate Amount: [1:None Present] [N/A:N/A] Wound Margin: [1:Distinct, outline attached N/A] Granulation Amount: [1:None Present (0%)] [N/A:N/A] Necrotic Amount: [1:None Present (0%)] [N/A:N/A] Exposed Structures: [1:Fascia: No Fat Layer (Subcutaneous Tissue) Exposed: No Tendon: No Muscle: No Joint: No Bone: No Large (67-100%)] [N/A:N/A N/A] Treatment Notes Electronic Signature(s) Signed: 08/31/2019 6:20:19 PM By: Linton Ham MD Entered By: Linton Ham on 08/31/2019 13:10:20 -------------------------------------------------------------------------------- [place] Details Patient Name: Date of Service: [name] Grice 08/31/2019 10:30 AM Medical Record [id number] Patient Account Number: [id number] Date of Birth/Sex: Treating RN: 09/17/1934 (85 y.o. Ernestene Mention Primary Care Ashtynn Berke: Nicholes Stairs Other Clinician: Referring Lasya Vetter: Treating Tamarcus Condie/Extender:Robson, Vickey Sages, Dorothe Pea in Treatment: 9 Active Inactive Electronic Signature(s) Signed: 08/31/2019 6:26:16 PM By: Baruch Gouty RN, BSN Entered By: Baruch Gouty on 08/31/2019  12:38:59 -------------------------------------------------------------------------------- Pain Assessment Details Patient Name: Date of Service: HANSEN, SWITZER 08/31/2019 10:30 AM Medical Record [id number] Patient Account Number: [id number] Date of Birth/Sex: Treating RN: [birth year] (85 y.o. [name] Primary Care Kortlyn Koltz: Nicholes Stairs Other Clinician: Referring Samarth Ogle: Treating Remigio Mcmillon/Extender:Robson, Vickey Sages, Dorothe Pea in Treatment: 9 Active Problems Location of Pain Severity and Description of Pain Patient Has Paino No Site Locations Pain Management and Medication Current Pain Management: Electronic Signature(s) Signed: 08/31/2019 5:57:08 PM By: Kela Millin Entered By: Kela Millin on 08/31/2019 12:05:12 -------------------------------------------------------------------------------- Patient/Caregiver Education Details Patient Name: Date of Service: [name] Grice 1/15/2021andnbsp10:30 AM Medical Record [id number] Patient Account Number: [id number] Date of Birth/Gender: Treating RN: Dec 23, 1934 (84 y.o. Ernestene Mention Primary Care Physician: Nicholes Stairs Other Clinician: Referring Physician: Treating Physician/Extender:Robson, Vickey Sages, Dorothe Pea in Treatment: 9 Education Assessment Education Provided To: Patient Education Topics Provided Venous: Methods: Explain/Verbal Responses: Reinforcements needed, State content correctly Wound/Skin Impairment: Methods: Explain/Verbal Responses: Reinforcements needed, State content correctly Electronic Signature(s) Signed: 08/31/2019 6:26:16 PM By: Baruch Gouty RN, BSN Entered By: Baruch Gouty on 08/31/2019 12:39:31 -------------------------------------------------------------------------------- Wound Assessment Details Patient Name: Date of Service: [name] Grice 08/31/2019 10:30 AM Medical Record [id number] Patient Account Number:  [id number] Date of Birth/Sex: Treating RN: 1934/12/05 (84 y.o. [name] Primary Care Tuff Clabo: Nicholes Stairs Other Clinician: Referring Tayana Shankle: Treating Davisha Linthicum/Extender:Robson, Vickey Sages, Dorothe Pea in Treatment: 9 Wound Status Wound Number: 1 Primary Abrasion Etiology: Wound Location: Right Lower Leg Wound Healed - Epithelialized Wounding Event: Trauma Status: Date Acquired: 05/17/2019 Comorbid Chronic Obstructive Pulmonary Disease Weeks Of Treatment: 9 History: (COPD), Hypertension, Neuropathy Clustered Wound: No Photos Wound Measurements Length: (cm) 0 % Reduct Width: (cm) 0 % Reduct Depth: (cm) 0 Epitheli Area: (cm) 0 Tunneli Volume: (cm) 0 Undermi Wound Description Classification: Full Thickness Without Exposed Support Structures Wound Distinct, outline attached Margin: Exudate None Present Amount: Wound Bed Granulation Amount: None Present (0%) Necrotic Amount: None Present (0%) Foul Odor After Cleansing: No Slough/Fibrino No Exposed Structure Fascia Exposed: No Fat Layer (Subcutaneous Tissue) Exposed: No Tendon Exposed: No Muscle Exposed: No Joint Exposed: No Bone Exposed: No ion in Area: 100% ion in Volume: 100% alization: Large (67-100%) ng: No ning: No Electronic Signature(s) Signed: 09/03/2019 5:45:55 PM By: Kela Millin Signed: 09/04/2019 4:37:08 PM By: Mikeal Hawthorne EMT/HBOT Previous Signature: 08/31/2019 5:57:08 PM Version By: Kela Millin Entered By: Mikeal Hawthorne on 09/03/2019 16:21:36 -------------------------------------------------------------------------------- Vitals Details Patient Name: Date of Service: [name] Grice 08/31/2019 10:30 AM Medical Record [id number] Patient Account Number: [id number] Date of Birth/Sex: Treating RN: 08-01-1935 (85 y.o. [name] Primary Care Friedrich Harriott:  Little, Priscille Heidelberg Other Clinician: Referring Cydney Alvarenga: Treating Arvin Abello/Extender:Robson,  Vickey Sages, Dorothe Pea in Treatment: 9 Vital Signs Time Taken: 12:00 Temperature (F): 98.3 Height (in): 68 Pulse (bpm): 72 Weight (lbs): 145 Respiratory Rate (breaths/min): 19 Body Mass Index (BMI): 22 Blood Pressure (mmHg): 149/53 Reference Range: 80 - 120 mg / dl Electronic Signature(s) Signed: 08/31/2019 5:57:08 PM By: Kela Millin Entered By: Kela Millin on 08/31/2019 12:05:07

## 2019-08-31 NOTE — Progress Notes (Signed)
[name] ([id number]) Visit Report for 08/31/2019 HPI Details Patient Name: Date of Service: [name], [name] 08/31/2019 10:30 AM Medical Record [id number] Patient Account Number: [id number] Date of Birth/Sex: Treating RN: 1935/02/06 (85 y.o. M) Primary Care Provider: Nicholes Stairs Other Clinician: Referring Provider: Treating Provider/Extender:Cyle Kenyon, Vickey Sages, Dorothe Pea in Treatment: 9 History of Present Illness HPI Description: ADMISSION 06/29/2019 This is an 85 year old man who was referred here for review of the wound on the right medial lower leg. This started as trauma after he dropped his oxygen tank on his leg. By his description he had an intact hematoma that lasted for about 2 weeks and then it spontaneously opened on its own. He was seen by his primary doctor and put on antibiotic therapy on 10/21 with cephalexin 500 twice a day. He completed this recently. The patient has been placing Vaseline gauze over the wound area. He is not in any compression. Past medical history; COPD with chronic oxygen therapy, back pain, gait instability, status post left inguinal hernia repair on 02/07/2019, chronic venous insufficiency, chronic atrial fibrillation. He is currently going to outpatient physical therapy ABIs in our clinic were 1.08 on the right 11/20 punched out area on the right medial lower leg. We have debrided this last week applied silver alginate under compression. There is no option for home health as he is going out to outpatient physical therapy. He is reluctant to use home health companies for physical therapy that he feels gets better service at an outpatient rehab facility. Looking at his left leg he clearly has changes of chronic venous insufficiency no doubt this was contributing to the swelling on the right leg last week 12/4; right medial lower leg in the setting of significant chronic venous insufficiency. Wound itself has  some epithelialization coming from the medial aspect. The lateral aspect of the wound this circular wound has raised edges. We are using silver collagen under compression 12/11; right medial lower leg in the setting of significant chronic venous insufficiency. In general slightly smaller. He is approved for Apligraf without a co-pay and we will order this for next week. I am trying to get this to close as fast as possible. 12/18; right medial lower leg in the setting of chronic venous stasis. We applied Apligraf #1 12/31; right medial lower leg traumatic wound in the setting of chronic venous stasis. Apligraf #2. Much improved 1/15; right medial lower leg traumatic wound in the setting of chronic venous stasis the wound is totally epithelialized today Electronic Signature(s) Signed: 08/31/2019 6:20:19 PM By: Linton Ham MD Entered By: Linton Ham on 08/31/2019 13:11:37 -------------------------------------------------------------------------------- Physical Exam Details Patient Name: Date of Service: [name] 08/31/2019 10:30 AM Medical Record [id number] Patient Account Number: [id number] Date of Birth/Sex: Treating RN: August 10, 1935 (85 y.o. M) Primary Care Provider: Nicholes Stairs Other Clinician: Referring Provider: Treating Provider/Extender:Eithan Beagle, Vickey Sages, Dorothe Pea in Treatment: 9 Constitutional Patient is hypertensive.. Pulse regular and within target range for patient.[name] Respirations regular, non-labored and within target range.. Temperature is normal and within the target range for the patient.[name] Appears in no distress. Notes Wound exam; right lower leg this is fully epithelialized. The patient still has changes of chronic venous insufficiency but not much in the way of edema. His skin in this area is very frail Engineer, maintenance) Signed: 08/31/2019 6:20:19 PM By: Linton Ham MD Entered By: Linton Ham on 08/31/2019  13:12:12 -------------------------------------------------------------------------------- Physician Orders Details Patient Name: Date of Service: [name] 08/31/2019  10:30 AM Medical Record [id number] Patient Account Number: [id number] Date of Birth/Sex: Treating RN: [birth year] (85 y.o. Ernestene Mention Primary Care Provider: Nicholes Stairs Other Clinician: Referring Provider: Treating Provider/Extender:Jamariyah Johannsen, Vickey Sages, Dorothe Pea in Treatment: 9 Verbal / Phone Orders: No Diagnosis Coding Discharge From [facility] Services Discharge from [place] lotion - to both legs daily Edema Control Elevate legs to the level of the heart or above for 30 minutes daily and/or when sitting, a frequency of: Exercise regularly Electronic Signature(s) Signed: 08/31/2019 6:20:19 PM By: Linton Ham MD Signed: 08/31/2019 6:26:16 PM By: Baruch Gouty RN, BSN Entered By: Baruch Gouty on 08/31/2019 12:42:39 -------------------------------------------------------------------------------- Problem List Details Patient Name: Date of Service: [name] 08/31/2019 10:30 AM Medical Record [id number] Patient Account Number: [id number] Date of Birth/Sex: Treating RN: September 29, 1934 (85 y.o. [name], [name] Primary Care Provider: Nicholes Stairs Other Clinician: Referring Provider: Treating Provider/Extender:Akita Maxim, Vickey Sages, Dorothe Pea in Treatment: 9 Active Problems ICD-10 Evaluated Encounter Code Description Active Date Today Diagnosis I87.333 Chronic venous hypertension (idiopathic) with ulcer 07/06/2019 No Yes and inflammation of bilateral lower extremity L97.812 Non-pressure chronic ulcer of other part of right lower 06/29/2019 No Yes leg with fat layer exposed S80.11XD Contusion of right lower leg, subsequent encounter 06/29/2019 No Yes Inactive Problems Resolved Problems Electronic  Signature(s) Signed: 08/31/2019 6:20:19 PM By: Linton Ham MD Entered By: Linton Ham on 08/31/2019 13:05:25 -------------------------------------------------------------------------------- Progress Note Details Patient Name: Date of Service: [name] 08/31/2019 10:30 AM Medical Record [id number] Patient Account Number: [id number] Date of Birth/Sex: Treating RN: 25-Jun-1935 (85 y.o. M) Primary Care Provider: Nicholes Stairs Other Clinician: Referring Provider: Treating Provider/Extender:Danelle Curiale, Vickey Sages, Dorothe Pea in Treatment: 9 Subjective History of Present Illness (HPI) ADMISSION 06/29/2019 This is an 85 year old man who was referred here for review of the wound on the right medial lower leg. This started as trauma after he dropped his oxygen tank on his leg. By his description he had an intact hematoma that lasted for about 2 weeks and then it spontaneously opened on its own. He was seen by his primary doctor and put on antibiotic therapy on 10/21 with cephalexin 500 twice a day. He completed this recently. The patient has been placing Vaseline gauze over the wound area. He is not in any compression. Past medical history; COPD with chronic oxygen therapy, back pain, gait instability, status post left inguinal hernia repair on 02/07/2019, chronic venous insufficiency, chronic atrial fibrillation. He is currently going to outpatient physical therapy ABIs in our clinic were 1.08 on the right 11/20 punched out area on the right medial lower leg. We have debrided this last week applied silver alginate under compression. There is no option for home health as he is going out to outpatient physical therapy. He is reluctant to use home health companies for physical therapy that he feels gets better service at an outpatient rehab facility. Looking at his left leg he clearly has changes of chronic venous insufficiency no doubt this was contributing to  the swelling on the right leg last week 12/4; right medial lower leg in the setting of significant chronic venous insufficiency. Wound itself has some epithelialization coming from the medial aspect. The lateral aspect of the wound this circular wound has raised edges. We are using silver collagen under compression 12/11; right medial lower leg in the setting of significant chronic venous insufficiency. In general slightly smaller. He is approved for Apligraf without a co-pay and  we will order this for next week. I am trying to get this to close as fast as possible. 12/18; right medial lower leg in the setting of chronic venous stasis. We applied Apligraf #1 12/31; right medial lower leg traumatic wound in the setting of chronic venous stasis. Apligraf #2. Much improved 1/15; right medial lower leg traumatic wound in the setting of chronic venous stasis the wound is totally epithelialized today Objective Constitutional Patient is hypertensive.. Pulse regular and within target range for patient.[name] Respirations regular, non-labored and within target range.. Temperature is normal and within the target range for the patient.[name] Appears in no distress. Vitals Time Taken: 12:00 PM, Height: 68 in, Weight: 145 lbs, BMI: 22, Temperature: 98.3 F, Pulse: 72 bpm, Respiratory Rate: 19 breaths/min, Blood Pressure: 149/53 mmHg. General Notes: Wound exam; right lower leg this is fully epithelialized. The patient still has changes of chronic venous insufficiency but not much in the way of edema. His skin in this area is very frail Integumentary (Hair, Skin) Wound #1 status is Open. Original cause of wound was Trauma. The wound is located on the Right Lower Leg. The wound measures 0cm length x 0cm width x 0cm depth; 0cm^2 area and 0cm^3 volume. There is no tunneling or undermining noted. There is a none present amount of drainage noted. The wound margin is distinct with the outline attached to the wound base.  There is no granulation within the wound bed. There is no necrotic tissue within the wound bed. Assessment Active Problems ICD-10 Chronic venous hypertension (idiopathic) with ulcer and inflammation of bilateral lower extremity Non-pressure chronic ulcer of other part of right lower leg with fat layer exposed Contusion of right lower leg, subsequent encounter Plan Discharge From [facility] Services: Discharge from [facility] Barriers/Peri-Wound Care: Moisturizing lotion - to both legs daily Edema Control: Elevate legs to the level of the heart or above for 30 minutes daily and/or when sitting, a frequency of: Exercise regularly 1. The patient can be discharged from the wound care center. 2. I had suggested the patient possibility of wearing lower extremity stockings such as support hose. He did not seem interested. 3. This was initially a traumatic wound however Electronic Signature(s) Signed: 08/31/2019 6:20:19 PM By: Linton Ham MD Entered By: Linton Ham on 08/31/2019 13:13:39 -------------------------------------------------------------------------------- SuperBill Details Patient Name: Date of Service: [name] 08/31/2019 Medical Record [id number] Patient Account Number: [id number] Date of Birth/Sex: Treating RN: 03-13-1935 (85 y.o. Ernestene Mention Primary Care Provider: Nicholes Stairs Other Clinician: Referring Provider: Treating Provider/Extender:Fiana Gladu, Vickey Sages, Dorothe Pea in Treatment: 9 Diagnosis Coding ICD-10 Codes Code Description [phone] Chronic venous hypertension (idiopathic) with ulcer and inflammation of bilateral lower extremity L97.812 Non-pressure chronic ulcer of other part of right lower leg with fat layer exposed S80.11XD Contusion of right lower leg, subsequent encounter Facility Procedures CPT4 Code: [id number] Description: [id number] - WOUND CARE VISIT-LEV 3 EST PT Modifier: Quantity: 1 Physician  Procedures CPT4: Code [id number] Description: 212 - WC PHYS LEVEL 2 - EST PT ICD-10 Diagnosis Description I87.333 Chronic venous hypertension (idiopathic) with ulcer and i lower extremity L97.812 Non-pressure chronic ulcer of other part of right lower l S80.11XD Contusion of right lower  leg, subsequent encounter Modifier: nflammation of b eg with fat laye Quantity: 1 ilateral r exposed Electronic Signature(s) Signed: 08/31/2019 6:20:19 PM By: Linton Ham MD Entered By: Linton Ham on 08/31/2019 13:14:02

## 2019-09-01 ENCOUNTER — Ambulatory Visit: Payer: Medicare Other | Attending: Internal Medicine

## 2019-09-01 DIAGNOSIS — Z23 Encounter for immunization: Secondary | ICD-10-CM | POA: Diagnosis not present

## 2019-09-01 NOTE — Progress Notes (Signed)
   Covid-19 Vaccination Clinic  Name:  [name]    MRN: [id number] DOB: [birth year]  09/01/2019  Mr. [name] was observed post Covid-19 immunization for 30 minutes based on pre-vaccination screening without incidence. He was provided with Vaccine Information Sheet and instruction to access the V-Safe system.   Mr. [name] was instructed to call 911 with any severe reactions post vaccine: [name] Difficulty breathing  . Swelling of your face and throat  . A fast heartbeat  . A bad rash all over your body  . Dizziness and weakness    Immunizations Administered    Name Date Dose VIS Date Route   Pfizer COVID-19 Vaccine 09/01/2019 12:29 PM 0.3 mL 07/27/2019 Intramuscular   Manufacturer: [place]   Lot: [id number]   [place]: [id number]

## 2019-09-07 ENCOUNTER — Telehealth: Payer: Self-pay | Admitting: Cardiovascular Disease

## 2019-09-07 ENCOUNTER — Telehealth: Payer: Self-pay

## 2019-09-07 NOTE — Telephone Encounter (Signed)
Patient requesting his caregiver Flossie Dibble to come with him to his appointment. He states he needs her help getting in.

## 2019-09-07 NOTE — Telephone Encounter (Signed)
Pt states he has mobility issues and will need his caretaker to accompany him. I told him this would be okay so long as she wears a mask and has no sx of COVID. He verbalized understanding and had no additional questions.

## 2019-09-07 NOTE — Telephone Encounter (Signed)
Opened in error

## 2019-09-10 ENCOUNTER — Ambulatory Visit (INDEPENDENT_AMBULATORY_CARE_PROVIDER_SITE_OTHER): Payer: Medicare Other | Admitting: Cardiovascular Disease

## 2019-09-10 ENCOUNTER — Encounter: Payer: Self-pay | Admitting: Cardiovascular Disease

## 2019-09-10 ENCOUNTER — Other Ambulatory Visit: Payer: Self-pay

## 2019-09-10 VITALS — BP 144/70 | HR 86 | Ht 68.0 in | Wt 142.4 lb

## 2019-09-10 DIAGNOSIS — R0602 Shortness of breath: Secondary | ICD-10-CM | POA: Diagnosis not present

## 2019-09-10 DIAGNOSIS — I2729 Other secondary pulmonary hypertension: Secondary | ICD-10-CM

## 2019-09-10 DIAGNOSIS — E782 Mixed hyperlipidemia: Secondary | ICD-10-CM

## 2019-09-10 DIAGNOSIS — I25119 Atherosclerotic heart disease of native coronary artery with unspecified angina pectoris: Secondary | ICD-10-CM

## 2019-09-10 DIAGNOSIS — I471 Supraventricular tachycardia, unspecified: Secondary | ICD-10-CM

## 2019-09-10 DIAGNOSIS — I700 Atherosclerosis of aorta: Secondary | ICD-10-CM | POA: Diagnosis not present

## 2019-09-10 NOTE — Patient Instructions (Signed)
Medication Instructions:  1) STOP DIGOXIN *If you need a refill on your cardiac medications before your next appointment, please call your pharmacy*  Testing/Procedures: Your physician has requested that you have an echocardiogram. Echocardiography is a painless test that uses sound waves to create images of your heart. It provides your doctor with information about the size and shape of your heart and how well your heart's chambers and valves are working. This procedure takes approximately one hour. There are no restrictions for this procedure.  Your provider has requested that you have a lexiscan myoview. For further information please visit [email or web address]. Please follow instruction sheet, as given.  Follow-Up: At [facility], you and your health needs are our priority.  As part of our continuing mission to provide you with exceptional heart care, we have created designated Provider Care Teams.  These Care Teams include your primary Cardiologist (physician) and Advanced Practice Providers (APPs -  Physician Assistants and Nurse Practitioners) who all work together to provide you with the care you need, when you need it. Your next appointment:   12 month(s) The format for your next appointment:   In Person Provider:   You may see Sherren Mocha, MD or one of the following Advanced Practice Providers on your designated Care Team:    Richardson Dopp, PA-C  Vin [place], [place]  Daune Perch, [place]

## 2019-09-10 NOTE — Progress Notes (Signed)
Cardiology Office Note:    Date:  09/10/2019   ID:  [name], DOB 01-20-1935, MRN [id number]  PCP:  Hulan Fess, MD  Cardiologist:  Sherren Mocha, MD  Electrophysiologist:  None   Referring MD: Hulan Fess, MD   Chief Complaint  Patient presents with  . Shortness of Breath    History of Present Illness:    [name] is a 85 y.o. male with a hx of coronary artery disease, presenting for follow-up evaluation.  The patient has had coronary calcification identified on CT imaging.  He also has had aortic atherosclerosis and calcification identified.  He had a remote cardiac catheterization in 2003 demonstrating nonobstructive CAD with the exception of an 80% ostial ramus stenosis involving a small branch vessel.  The patient is here with his caregiver today.  He has had a tough year.  He sustained a compression fracture last summer and he is not bounced back from this.  He complains of progressive shortness of breath.  He denies chest pain or pressure.  He complains of generalized fatigue and worse difficulty with exercise intolerance.  He denies orthopnea, PND, or any change in his chronic leg swelling.  Past Medical History:  Diagnosis Date  . Allergic rhinitis, cause unspecified   . Arthritis   . COPD (chronic obstructive pulmonary disease) ([place])   . Dyspnea   . Dysrhythmia    "1 episode of tachycardia in 1980's, been on it ever since"  . Emphysema of lung ([place])   . Hypertension   . Left inguinal hernia 02/07/2019  . Neuromuscular disorder ([place])   . Neuropathy    feet  . Pneumonia    History of, last Oct 2019  . Pulmonary nodule     Past Surgical History:  Procedure Laterality Date  . APPENDECTOMY    . CARDIAC CATHETERIZATION  2003   performed at [facility], Dr. Fransico Him  . CATARACT EXTRACTION, BILATERAL    . EYE SURGERY    . INGUINAL HERNIA REPAIR Left 02/07/2019   Procedure: OPEN REPAIR LEFT INGUINAL HERNIA WITH MESH;  Surgeon: Fanny Skates, MD;   Location: [place];  Service: General;  Laterality: Left;  SPINAL AND TAP BLOCK ANESTHESIA  . ROTATOR CUFF REPAIR    . TONSILLECTOMY    . VASECTOMY      Current Medications: Current Meds  Medication Sig  . albuterol (PROAIR HFA) 108 (90 BASE) MCG/ACT inhaler Inhale 2 puffs into the lungs 4 (four) times daily as needed for wheezing or shortness of breath.  [name] aspirin EC 81 MG tablet Take 1 tablet (81 mg total) by mouth daily.  [name] HYDROcodone-acetaminophen (NORCO/VICODIN) 5-325 MG tablet Take 1 tablet by mouth every 6 (six) hours as needed.  [name] ipratropium-albuterol (DUONEB) 0.5-2.5 (3) MG/3ML SOLN Take 3 mLs by nebulization every 6 (six) hours as needed.  [name] ketoconazole (NIZORAL) 2 % shampoo Apply 1 application topically 2 (two) times a week.  . losartan (COZAAR) 25 MG tablet TAKE 1 TABLET(25 MG) BY MOUTH DAILY  . Nutritional Supplements (ENSURE HIGH PROTEIN) LIQD Take 1 Bottle by mouth daily.  . OXYGEN Inhale 3-5 L into the lungs continuous.   . polyethylene glycol powder (GLYCOLAX/MIRALAX) powder Take 17 g by mouth daily.  . pravastatin (PRAVACHOL) 40 MG tablet Take 40 mg by mouth daily.   [name] Respiratory Therapy Supplies (FLUTTER) DEVI Use as directed  . SPIRIVA RESPIMAT 2.5 MCG/ACT AERS INHALE 2 PUFFS BY MOUTH EVERY DAY  . SYMBICORT 160-4.5 MCG/ACT inhaler INHALE  2 PUFFS BY MOUTH INTO THE LUNGS TWICE DAILY. RINSE MOUTH  . tamsulosin (FLOMAX) 0.4 MG CAPS capsule Take 0.4 mg by mouth daily.   . [DISCONTINUED] digoxin (LANOXIN) 0.25 MG tablet Take 0.25 mg by mouth daily.      Allergies:   Tolectin [tolmetin sodium], Montelukast sodium, Nsaids, Augmentin [amoxicillin-pot clavulanate], and Neosporin [bacitracin-polymyxin b]   Social History   Socioeconomic History  . Marital status: Married    Spouse name: Not on file  . Number of children: Not on file  . Years of education: Not on file  . Highest education level: Not on file  Occupational History  . Not on file  Tobacco Use  . Smoking  status: Former Smoker    Packs/day: 2.00    Years: 15.00    Pack years: 30.00    Types: Cigarettes    Quit date: 08/16/1990    Years since quitting: 29.0  . Smokeless tobacco: Never Used  Substance and Sexual Activity  . Alcohol use: Yes    Alcohol/week: 14.0 standard drinks    Types: 14 Shots of liquor per week    Comment: everyday vodka 2 drink a day  . Drug use: No  . Sexual activity: Not on file  Other Topics Concern  . Not on file  Social History Narrative  . Not on file   Social Determinants of Health   Financial Resource Strain:   . Difficulty of Paying Living Expenses: Not on file  Food Insecurity:   . Worried About Charity fundraiser in the Last Year: Not on file  . Ran Out of Food in the Last Year: Not on file  Transportation Needs:   . Lack of Transportation (Medical): Not on file  . Lack of Transportation (Non-Medical): Not on file  Physical Activity:   . Days of Exercise per Week: Not on file  . Minutes of Exercise per Session: Not on file  Stress:   . Feeling of Stress : Not on file  Social Connections:   . Frequency of Communication with Friends and Family: Not on file  . Frequency of Social Gatherings with Friends and Family: Not on file  . Attends Religious Services: Not on file  . Active Member of Clubs or Organizations: Not on file  . Attends Archivist Meetings: Not on file  . Marital Status: Not on file     Family History: The patient's family history includes COPD in his mother and sister; Coronary artery disease in an other family member; Stroke in his father and mother.  ROS:   Please see the history of present illness.    All other systems reviewed and are negative.  EKGs/Labs/Other Studies Reviewed:    The following studies were reviewed today: Echo 07-31-2018: Study Conclusions  - Left ventricle: The cavity size was normal. Systolic function was   normal. The estimated ejection fraction was in the range of 55%   to 60%.  Wall motion was normal; there were no regional wall   motion abnormalities. Doppler parameters are consistent with   abnormal left ventricular relaxation (grade 1 diastolic   dysfunction). - Aortic valve: Trileaflet; mildly thickened, mildly calcified   leaflets. Transvalvular velocity was within the normal range.   There was no stenosis. - Right ventricle: The cavity size was mildly dilated. Wall   thickness was normal. Systolic function was normal. - Right atrium: The atrium was moderately dilated. - Tricuspid valve: There was moderate regurgitation. - Pulmonic valve: There was  trivial regurgitation. - Pulmonary arteries: Systolic pressure was moderately increased.   PA peak pressure: 49 mm Hg (S). - Inferior vena cava: The vessel was normal in size.  EKG:  EKG is not ordered today.   Recent Labs: 02/28/2019: ALT 17; BUN 11; Creatinine, Ser 0.54; Potassium 4.5; Sodium 134 06/11/2019: Hemoglobin 11.8; Platelets 226  Recent Lipid Panel No results found for: CHOL, TRIG, HDL, CHOLHDL, VLDL, LDLCALC, LDLDIRECT  Physical Exam:    VS:  BP (!) 144/70   Pulse 86   Ht 5\' 8"  (1.727 m)   Wt 142 lb 6.4 oz (64.6 kg)   SpO2 99%   BMI 21.65 kg/m     Wt Readings from Last 3 Encounters:  09/10/19 142 lb 6.4 oz (64.6 kg)  07/23/19 144 lb 9.6 oz (65.6 kg)  06/11/19 146 lb 12.8 oz (66.6 kg)     GEN:  Thin male, on O2, in no acute distress HEENT: Normal NECK: No JVD; No carotid bruits LYMPHATICS: No lymphadenopathy CARDIAC: RRR, no murmurs, rubs, gallops distant heart sounds RESPIRATORY:  Poor air movement, crackles left base ABDOMEN: Soft, non-tender, non-distended MUSCULOSKELETAL:  Trace BL pretibial edema; No deformity  SKIN: Warm and dry NEUROLOGIC:  Alert and oriented x 3 PSYCHIATRIC:  Normal affect   ASSESSMENT:    1. Coronary artery disease involving native coronary artery of native heart with angina pectoris ([place])   2. Atherosclerosis of aorta ([place])   3. Shortness of breath    4. Mixed hyperlipidemia   5. Other secondary pulmonary hypertension ([facility])   6. Paroxysmal SVT (supraventricular tachycardia) ([facility])    PLAN:    In order of problems listed above:  1. The patient has progressive symptoms of shortness of breath and fatigue.  I have recommended a repeat echocardiogram to reassess LV and RV function as well as pulmonary hypertension in the setting of his chronic lung disease.  I have recommended a Lexiscan Myoview stress test to evaluate for ischemic heart disease.  He has known atherosclerosis and history of nonobstructive disease by remote heart catheterization.  He does not have typical symptoms of angina. 2. Medical therapy will be continued with aspirin and a statin drug. 3. As above, check echo and pharmacologic myocardial perfusion scan 4. Treated with a statin drug 5. Repeat echo as above 6. The patient has had no heart palpitations or symptomatic SVT in over a decade.  I have recommended that he discontinue digoxin.   Medication Adjustments/Labs and Tests Ordered: Current medicines are reviewed at length with the patient today.  Concerns regarding medicines are outlined above.  Orders Placed This Encounter  Procedures  . MYOCARDIAL PERFUSION IMAGING  . ECHOCARDIOGRAM COMPLETE   No orders of the defined types were placed in this encounter.   Patient Instructions  Medication Instructions:  1) STOP DIGOXIN *If you need a refill on your cardiac medications before your next appointment, please call your pharmacy*  Testing/Procedures: Your physician has requested that you have an echocardiogram. Echocardiography is a painless test that uses sound waves to create images of your heart. It provides your doctor with information about the size and shape of your heart and how well your heart's chambers and valves are working. This procedure takes approximately one hour. There are no restrictions for this procedure.  Your provider has requested that you  have a lexiscan myoview. For further information please visit [email or web address]. Please follow instruction sheet, as given.  Follow-Up: At [facility], you and your health needs are our priority.  As part of our continuing mission to provide you with exceptional heart care, we have created designated Provider Care Teams.  These Care Teams include your primary Cardiologist (physician) and Advanced Practice Providers (APPs -  Physician Assistants and Nurse Practitioners) who all work together to provide you with the care you need, when you need it. Your next appointment:   12 month(s) The format for your next appointment:   In Person Provider:   You may see Sherren Mocha, MD or one of the following Advanced Practice Providers on your designated Care Team:    Richardson Dopp, PA-C  Vin Marley, PA-C  Daune Perch, [place]    Signed, Sherren Mocha, MD  09/10/2019 1:31 PM    [place]

## 2019-09-14 ENCOUNTER — Ambulatory Visit: Payer: Self-pay | Admitting: Internal Medicine

## 2019-09-18 DIAGNOSIS — S32010D Wedge compression fracture of first lumbar vertebra, subsequent encounter for fracture with routine healing: Secondary | ICD-10-CM | POA: Diagnosis not present

## 2019-09-20 ENCOUNTER — Encounter: Payer: Self-pay | Admitting: Internal Medicine

## 2019-09-20 ENCOUNTER — Ambulatory Visit (INDEPENDENT_AMBULATORY_CARE_PROVIDER_SITE_OTHER): Payer: Medicare Other | Admitting: Internal Medicine

## 2019-09-20 ENCOUNTER — Other Ambulatory Visit: Payer: Self-pay

## 2019-09-20 VITALS — BP 134/72 | HR 98 | Temp 97.1°F | Ht 68.0 in | Wt 142.1 lb

## 2019-09-20 DIAGNOSIS — I2510 Atherosclerotic heart disease of native coronary artery without angina pectoris: Secondary | ICD-10-CM

## 2019-09-20 DIAGNOSIS — J449 Chronic obstructive pulmonary disease, unspecified: Secondary | ICD-10-CM | POA: Diagnosis not present

## 2019-09-20 DIAGNOSIS — N401 Enlarged prostate with lower urinary tract symptoms: Secondary | ICD-10-CM | POA: Diagnosis not present

## 2019-09-20 DIAGNOSIS — J9611 Chronic respiratory failure with hypoxia: Secondary | ICD-10-CM

## 2019-09-20 DIAGNOSIS — G603 Idiopathic progressive neuropathy: Secondary | ICD-10-CM | POA: Diagnosis not present

## 2019-09-20 DIAGNOSIS — D649 Anemia, unspecified: Secondary | ICD-10-CM

## 2019-09-20 DIAGNOSIS — I10 Essential (primary) hypertension: Secondary | ICD-10-CM

## 2019-09-20 DIAGNOSIS — I872 Venous insufficiency (chronic) (peripheral): Secondary | ICD-10-CM | POA: Insufficient documentation

## 2019-09-20 DIAGNOSIS — R351 Nocturia: Secondary | ICD-10-CM | POA: Diagnosis not present

## 2019-09-20 DIAGNOSIS — R5383 Other fatigue: Secondary | ICD-10-CM | POA: Diagnosis not present

## 2019-09-20 DIAGNOSIS — S32019S Unspecified fracture of first lumbar vertebra, sequela: Secondary | ICD-10-CM | POA: Diagnosis not present

## 2019-09-20 DIAGNOSIS — E7800 Pure hypercholesterolemia, unspecified: Secondary | ICD-10-CM | POA: Diagnosis not present

## 2019-09-20 DIAGNOSIS — I25119 Atherosclerotic heart disease of native coronary artery with unspecified angina pectoris: Secondary | ICD-10-CM

## 2019-09-20 DIAGNOSIS — S32019A Unspecified fracture of first lumbar vertebra, initial encounter for closed fracture: Secondary | ICD-10-CM | POA: Insufficient documentation

## 2019-09-20 NOTE — Progress Notes (Signed)
Provider:  Rexene Edison. Mariea Clonts, D.O., C.M.D. Location:   [facility]   Place of Service:   clinic  Previous PCP: Gayland Curry, DO Patient Care Team: Gayland Curry, DO as PCP - General (Geriatric Medicine) Sherren Mocha, MD as PCP - Cardiology (Cardiology) Jari Pigg, MD as Consulting Physician (Dermatology) Sherren Mocha, MD as Consulting Physician (Cardiology) Corey Harold, MD as Consulting Physician Newman Pies, MD as Consulting Physician (Neurosurgery) Deneise Lever, MD as Consulting Physician (Pulmonary Disease)  Extended Emergency Contact Information Primary Emergency Contact: [place] Address: [address]          [place], [name] [address] Johnnette Litter of [place] Phone: [phone] Relation: Spouse Secondary Emergency Contact: [place] of [place] Phone: [phone] Mobile Phone: [phone] Relation: Son  Code Status:  Admitted last as full code--needs discussion after a couple of visits Goals of Care: Advanced Directive information Advanced Directives 09/20/2019  Does Patient Have a Medical Advance Directive? Yes  Type of Paramedic of [place];Living will  Does patient want to make changes to medical advance directive? No - Patient declined  Copy of [place] in Chart? -  Would patient like information on creating a medical advance directive? -   Chief Complaint  Patient presents with  . Establish Care    New patient to establish care.    HPI: Patient is a 85 y.o. male seen today to establish with [facility].  He wanted to meet me before determining if he wants to switch over here.  He's been seeing Dr. Hulan Fess at [facility] for about 25 years and his schedule was reduced so it was harder to get seen.  He has a h/o COPD, L1 vertebral fx, anemia, HTN, hyperlipidemia, and neuropathy.    He had a fall in July of last year and found it challenging to get good care.  He  went to [facility] rehab and b/c he's part of [facility], it go covered there w/o a prolonged hospitalization.  In late June last year, he had hernia surgery.  As far as he knows, all went well.  3 wks later, he fell in the bathroom and has no idea why.  He was grasping for the wall and went over backwards early in the am.  Ever since then, he has not felt well.  Had annual with Dr. Burt Knack and he has scheduled an echo and stress test.  He stopped the digoxin at that visit--he's been off it for a week.    Breathing has been considerably reduced vs before his fall.  He has no energy, he's tired most of the time.  He wakes up in the morning and feels like he's not slept at all though he knows he did.  He has a slight headache when he gets up.  It's almost like it's there all the time, just a little.  He has seen Dr. Rex Kras since.  His hgb has been low.  He saw Dr. Irene Limbo, hematologist, who took 12 vials of blood and didn't see an explanation per pt.  He's been to see Dr. Annamaria Boots, pulmonology, who feels like the breathing will improve with correction of his hgb to 13-15.  It was down at 11.5.  It was up to 12.5 in Dec.  O2 stays above 90 at rest.  Needs 4-5 L with activity.  Needs 6L during shower.    He has neuropathy in his feet that he's learned to live with.  Occasionally, he gets real pain down there.  A doctor suggested some vitamins--B12 and something else, but it did not make any difference. Last B12 level was 425 in Oct '20 which is low normal, but low for a senior.  Fingers and hands are always cold.  Now the fingers are gradually getting numb perhaps one at a time.    He lost about 10 lbs when he fell and he has not gained much of it back.  He was at one time (probably 20-30 yrs ago) 210 lbs.  138 lbs at home now w/o clothes, 142 here fully dressed with shoes.  He had his f/u with Dr. Arnoldo Morale, neurosurgery, earlier this week.  He does not recommend any surgery and was told he has to live with what pain he  has and it might get a little better.  He continues to feel unstable on his feet as a result of the fall.  He uses a walker at home.  Here he came in with just the cane and his portable oxygen.  He had to hold onto the CMA to be safe leaving and was still unsteady.  He's on tamsulosin also for BPH.  He saw a urologist for 15 yrs for BPH.  He finally stopped going after that.  He'd been on finasteride before.  That was stopped.  Then he was having a lot of urgency so Dr. Rex Kras started the tamsulosin.  He still has to go 3-4 times at night.  Usually can go back to sleep.  Sometimes it's an hour to get back to sleep.  Uses a urinal at the bedside now. He's had two prior biopsies long ago.    He previously had his colonoscopies regularly--none for 3-4 years.  He is 84.  He smoked for 38 years.  Quit 25-30 yrs ago.  Had smoked 1-2 ppd.    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   He grew up in [place], went to [place], served 4 years in the [employer], went back to [facility] and lived in [place] for 22 years.  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  They packed up and moved here.  He is married.  One son came along and the other stayed in [place].    He does have living will and hcpoa.  He functions independently after recovering from his fall.  He has a caregiver from [place] and helps his wife out b/c of her spinal stenosis.    He had a hematoma on the medial aspect of his right lower leg when he dropped his oxygen tank on it.  He required wound care at the wound care center.  This is fully recovered.  Drinks once daily the 11oz high protein ensure (30g).  He's had low protein.  He's been in pulmonary rehab for the last 15 years.  For breakfast OJ, some morning cereal with a banana others english muffin with PB, lunches are protein drink, sometimes cheese, sometimes a little  ham, some tostito chips, a fruit or two, then high protein bar for dessert, and at dinner, they have chicken, fish 1-2 x per week, a starch and veggie.  He loves ice cream and has that for dessert.  He's not hungry but eats b/c he knows he should.    Past Medical History:  Diagnosis Date  . Allergic rhinitis, cause unspecified   .  Arthritis   . COPD (chronic obstructive pulmonary disease) ([place])   . Dyspnea   . Dysrhythmia    "1 episode of tachycardia in 1980's, been on it ever since"  . Emphysema of lung ([place])   . Hypertension   . Left inguinal hernia 02/07/2019  . Low hemoglobin    low level  . Neuromuscular disorder ([place])   . Neuropathy    feet  . Pneumonia    History of, last Oct 2019  . Pulmonary nodule    Past Surgical History:  Procedure Laterality Date  . APPENDECTOMY  1943  . CARDIAC CATHETERIZATION  2003   performed at [facility], Dr. Fransico Him  . CATARACT EXTRACTION  2008   Dr. Darleen Crocker  . CATARACT EXTRACTION, BILATERAL    . EYE SURGERY    . INGUINAL HERNIA REPAIR Left 02/07/2019   Procedure: OPEN REPAIR LEFT INGUINAL HERNIA WITH MESH;  Surgeon: Fanny Skates, MD;  Location: [facility];  Service: General;  Laterality: Left;  SPINAL AND TAP BLOCK ANESTHESIA  . ROTATOR CUFF REPAIR  2002   Dr. Kathryne Hitch  . TONSILLECTOMY  1945  . VASECTOMY      Social History   Socioeconomic History  . Marital status: Married    Spouse name: Not on file  . Number of children: Not on file  . Years of education: Not on file  . Highest education level: Not on file  Occupational History  . Not on file  Tobacco Use  . Smoking status: Former Smoker    Packs/day: 2.00    Years: 38.00    Pack years: 76.00    Types: Cigarettes    Quit date: 08/16/1990    Years since quitting: 29.1  . Smokeless tobacco: Never Used  Substance and Sexual Activity  . Alcohol use: Not Currently    Alcohol/week: 14.0 standard drinks    Types: 14 Shots of liquor per week    Comment: everyday vodka 2  drink a day  . Drug use: No  . Sexual activity: Not on file  Other Topics Concern  . Not on file  Social History Narrative   Social History      Diet?       Do you drink/eat things with caffeine? Coffee 1 cup daily       Marital status?                      Married               What year were you married? 1962      Do you live in a house, apartment, assisted living, condo, trailer, etc.? house      Is it one or more stories? 2      How many persons live in your home?  2      Do you have any pets in your home? (please list)   No       Highest level of education completed?  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       Do you exercise?                    some                  Type & how often? Did pulmonary rehab      Advanced Directives      Do  you have a living will? yes      Do you have a DNR form?                                  If not, do you want to discuss one?      Do you have signed POA/HPOA for forms? yes      Functional Status      Do you have difficulty bathing or dressing yourself?      Do you have difficulty preparing food or eating?       Do you have difficulty managing your medications?      Do you have difficulty managing your finances?      Do you have difficulty affording your medications?      Social Determinants of Health   Financial Resource Strain:   . Difficulty of Paying Living Expenses: Not on file  Food Insecurity:   . Worried About Charity fundraiser in the Last Year: Not on file  . Ran Out of Food in the Last Year: Not on file  Transportation Needs:   . Lack of Transportation (Medical): Not on file  . Lack of Transportation (Non-Medical): Not on file  Physical Activity:   . Days of Exercise per Week: Not on file  . Minutes of Exercise per Session: Not on file  Stress:   . Feeling of Stress : Not on file  Social Connections:   . Frequency of Communication with Friends  and Family: Not on file  . Frequency of Social Gatherings with Friends and Family: Not on file  . Attends Religious Services: Not on file  . Active Member of Clubs or Organizations: Not on file  . Attends Archivist Meetings: Not on file  . Marital Status: Not on file    reports that he quit smoking about 29 years ago. His smoking use included cigarettes. He has a 76.00 pack-year smoking history. He has never used smokeless tobacco. He reports previous alcohol use of about 14.0 standard drinks of alcohol per week. He reports that he does not use drugs.  Functional Status Survey:    Family History  Problem Relation Age of Onset  . COPD Mother   . Stroke Mother   . Stroke Father   . Coronary artery disease Other        [facility]    Health Maintenance  Topic Date Due  . TETANUS/TDAP  08/25/2020  . INFLUENZA VACCINE  Completed  . PNA vac Low Risk Adult  Completed    Allergies  Allergen Reactions  . Tolectin [Tolmetin Sodium] Other (See Comments)    BP low and passed out  . Montelukast Sodium Other (See Comments)    Unknown rxn;  . Nsaids   . Augmentin [Amoxicillin-Pot Clavulanate] Nausea And Vomiting    Did it involve swelling of the face/tongue/throat, SOB, or low BP? No Did it involve sudden or severe rash/hives, skin peeling, or any reaction on the inside of your mouth or nose? No Did you need to seek medical attention at a hospital or doctor's office? No When did it last happen?Last year If all above answers are "NO", may proceed with cephalosporin use.  . Neosporin [Bacitracin-Polymyxin B] Rash    Outpatient Encounter Medications as of 09/20/2019  Medication Sig  . acetaminophen (TYLENOL) 500 MG tablet Take 500 mg by mouth See admin instructions. 2 - 4 times a day  .  aspirin EC 81 MG tablet Take 1 tablet (81 mg total) by mouth daily.  [name] losartan (COZAAR) 25 MG tablet TAKE 1 TABLET(25 MG) BY MOUTH DAILY  . Nutritional Supplements (ENSURE HIGH PROTEIN) LIQD Take 1  Bottle by mouth daily.  . OXYGEN Inhale 3-6 L into the lungs continuous.   . polyethylene glycol powder (GLYCOLAX/MIRALAX) powder Take 17 g by mouth daily.  . pravastatin (PRAVACHOL) 40 MG tablet Take 40 mg by mouth daily.   [name] SPIRIVA RESPIMAT 2.5 MCG/ACT AERS INHALE 2 PUFFS BY MOUTH EVERY DAY  . SYMBICORT 160-4.5 MCG/ACT inhaler INHALE 2 PUFFS BY MOUTH INTO THE LUNGS TWICE DAILY. RINSE MOUTH  . tamsulosin (FLOMAX) 0.4 MG CAPS capsule Take 0.4 mg by mouth daily.   . [DISCONTINUED] digoxin (LANOXIN) 0.25 MG tablet Take 0.25 mg by mouth daily.  . [DISCONTINUED] HYDROcodone-acetaminophen (NORCO/VICODIN) 5-325 MG tablet Take 1 tablet by mouth every 6 (six) hours as needed.   No facility-administered encounter medications on file as of 09/20/2019.    Review of Systems  Constitutional: Positive for malaise/fatigue and weight loss. Negative for chills, diaphoresis and fever.  HENT: Positive for tinnitus. Negative for congestion and sore throat.   Eyes: Negative for blurred vision.  Respiratory: Positive for sputum production, shortness of breath and wheezing. Negative for cough.   Cardiovascular: Positive for leg swelling. Negative for chest pain and palpitations.       High blood pressure  Gastrointestinal: Negative for abdominal pain, blood in stool, constipation, diarrhea and melena.  Genitourinary: Positive for frequency. Negative for dysuria.       Weak stream  Musculoskeletal: Positive for falls.       That one fall; loss of balance, numbness of feet, fingers  Skin:       Skin hyperpigmentation of lower legs  Neurological:       May have had brief LOC with his fall--not clear  Endo/Heme/Allergies:       Anemia  Psychiatric/Behavioral: Negative for depression and memory loss. The patient is not nervous/anxious and does not have insomnia.     Vitals:   09/20/19 1341  BP: 134/72  Pulse: 98  Temp: (!) 97.1 F (36.2 C)  TempSrc: Temporal  SpO2: 99%  Weight: 142 lb 1.6 oz (64.5 kg)   Height: 5\' 8"  (1.727 m)   Body mass index is 21.61 kg/m. Physical Exam Vitals reviewed.  Constitutional:      General: He is not in acute distress.    Appearance: He is ill-appearing.     Comments: Frail  HENT:     Head: Normocephalic and atraumatic.     Right Ear: Tympanic membrane, ear canal and external ear normal. There is no impacted cerumen.     Left Ear: Tympanic membrane, ear canal and external ear normal. There is no impacted cerumen.     Nose:     Comments: Nose and mouth deferred due to covid masking and no related concerns Eyes:     Conjunctiva/sclera: Conjunctivae normal.     Pupils: Pupils are equal, round, and reactive to light.     Comments: glasses  Cardiovascular:     Rate and Rhythm: Normal rate and regular rhythm.     Pulses: Normal pulses.     Heart sounds: Normal heart sounds.  Pulmonary:     Effort: Pulmonary effort is normal. No respiratory distress.     Breath sounds: Wheezing and rhonchi present. No rales.     Comments: Wearing his O2 via , portable tank  Abdominal:     General: Bowel sounds are normal. There is no distension.     Palpations: Abdomen is soft.     Tenderness: There is no abdominal tenderness. There is no guarding or rebound.  Musculoskeletal:        General: Normal range of motion.     Right lower leg: Edema present.     Left lower leg: Edema present.     Comments: Unsteady gait with cane, carrying O2, tenderness over sacroiliac and lower lumbar region   Skin:    General: Skin is warm and dry.     Capillary Refill: Capillary refill takes less than 2 seconds.     Coloration: Skin is pale.     Comments: Hyperpigmentation of lower legs bilaterally  Neurological:     General: No focal deficit present.     Mental Status: He is alert and oriented to person, place, and time.     Cranial Nerves: No cranial nerve deficit.     Sensory: Sensory deficit present.     Motor: Weakness present.     Coordination: Coordination abnormal.      Gait: Gait abnormal.     Comments: Diminished DTRs  Psychiatric:        Mood and Affect: Mood normal.        Behavior: Behavior normal.        Thought Content: Thought content normal.        Judgment: Judgment normal.    Labs reviewed: Basic Metabolic Panel: Recent Labs    10/25/18 1053 10/25/18 1053 01/31/19 1126 02/07/19 1050 02/28/19 0811  NA 137  --  135  --  134*  K 4.5  --  4.4  --  4.5  CL 100  --  100  --  97*  CO2 28  --  26  --  27  GLUCOSE 101*  --  94  --  94  BUN 14  --  12  --  11  CREATININE 0.57*   < > 0.58* 0.60* 0.54*  CALCIUM 9.3  --  9.1  --  9.0   < > = values in this interval not displayed.   Liver Function Tests: Recent Labs    10/25/18 1053 02/28/19 0811  AST 19 21  ALT 16 17  ALKPHOS 43 43  BILITOT 0.7 1.2  PROT 6.5 6.2*  ALBUMIN 4.0 3.5  CBC: Recent Labs    10/25/18 1053 01/31/19 1126 02/07/19 1050 02/07/19 1050 02/28/19 0811 06/11/19 1213  WBC 7.9   < > 7.1  --  9.9 9.2  NEUTROABS 6.5  --   --   --  8.1* 8.1*  HGB 13.3   < > 11.5*  --  12.4* 11.8*  HCT 43.6   < > 35.5*   < > 38.9* 37.2*  36.4*  MCV 90.1   < > 88.5  --  90.5 90.7  PLT 198   < > 157  --  209 226   < > = values in this interval not displayed.   Lab Results  Component Value Date   [id number] 06/11/2019   Lab Results  Component Value Date   IRON 52 06/11/2019   TIBC 285 06/11/2019   FERRITIN 293 06/11/2019    Imaging and Procedures noted on new patient packet: He's uncertain when his last colonoscopy (?at [facility]), bone density (?at [facility]), and cardiac cath (not in epic) were   Assessment/Plan 1. Chronic respiratory failure with hypoxia (HCC) -suspect this  and his COPD are main contributors to his chronic fatigue, malaise, low energy -continue oxygen therapy  2. COPD mixed type ([place]) -continue spiriva, symbicort, oxygen therapy and following with Dr. Annamaria Boots -has been in pulmonary rehab a long time -encouraged high protein diet for energy  3.  Anemia, unspecified type -very mild, but pulmonary is recommending getting his hgb up to 13 at least to help him feel better -B12 on low end and I'd favor starting him on some orally to see how he does due to his advanced age--older adults do better with at least level of 500  4. Pure hypercholesterolemia -remains on pravastatin for cholesterol  -I don't have a recent lipid panel on him--need Dr. Eddie Dibbles records for this  5. Coronary artery disease involving native coronary artery of native heart without angina pectoris -is for echo and stress test with Dr. Burt Knack -certainly a change in his cardiac pump function or an MI or arrhythmia episode could have caused his fall so I'm interested to see how these turn out before we meet next  6. Other fatigue -mixed etiology -worse since fall--probably taking more energy to get around also since he's more unsteady and uncomfortable -again, would like to try B12 daily--we'll discuss at his next visit  7. Idiopathic progressive neuropathy -may also help to have a little b12 supplement -cause unclear (? Due to lumbar disease and cervical disease considering he also has numbness developing in his fingers)  8. Closed fracture of first lumbar vertebra, unspecified fracture morphology, sequela -gradually recovering, but still not steady ambulating despite therapy he had -off hydrocodone now -using tylenol for pain  9. Essential hypertension -bp at goal even at new patient visit -cont losartan  10. Benign prostatic hyperplasia with nocturia -continue tamsulosin therapy and bedside urinal  11. Venous insufficiency of both lower extremities -elevate feet at rest, may benefit from true compression hose, perhaps caregiver they have could assist with putting these on b/c it's a challenge   Labs/tests ordered:  No new today  Next visit:  10/18/2019 f/u after cardiology studies  Paeton Latouche L. Rubi Tooley, D.O. [facility] [address], [place] [address] Cell Phone (Mon-Fri 8am-5pm):  [phone] On Call:  [phone] & follow prompts after 5pm & weekends Office Phone:  [phone] Office Fax:  [phone]

## 2019-09-21 ENCOUNTER — Ambulatory Visit: Payer: Medicare Other | Attending: Internal Medicine

## 2019-09-21 DIAGNOSIS — Z23 Encounter for immunization: Secondary | ICD-10-CM | POA: Insufficient documentation

## 2019-09-21 NOTE — Progress Notes (Signed)
   Covid-19 Vaccination Clinic  Name:  [name]    MRN: [id number] DOB: 11-20-1934  09/21/2019  [name] was observed post Covid-19 immunization for 15 minutes without incidence. He was provided with Vaccine Information Sheet and instruction to access the V-Safe system.   [name] was instructed to call 911 with any severe reactions post vaccine: [name] Difficulty breathing  . Swelling of your face and throat  . A fast heartbeat  . A bad rash all over your body  . Dizziness and weakness    Immunizations Administered    Name Date Dose VIS Date Route   Pfizer COVID-19 Vaccine 09/21/2019  1:39 PM 0.3 mL 07/27/2019 Intramuscular   Manufacturer: [place]   Lot: [id number]   [place]: [id number]

## 2019-09-24 ENCOUNTER — Telehealth (HOSPITAL_COMMUNITY): Payer: Self-pay | Admitting: *Deleted

## 2019-09-24 NOTE — Telephone Encounter (Signed)
Left message on voicemail per DPR in reference to upcoming appointment scheduled on 09/28/19 at 10:30 with detailed instructions given per Myocardial Perfusion Study Information Sheet for the test. LM to arrive 15 minutes early, and that it is imperative to arrive on time for appointment to keep from having the test rescheduled. If you need to cancel or reschedule your appointment, please call the office within 24 hours of your appointment. Failure to do so may result in a cancellation of your appointment, and a $50 no show fee. Phone number given for call back for any questions.

## 2019-09-25 ENCOUNTER — Telehealth (HOSPITAL_COMMUNITY): Payer: Self-pay

## 2019-09-25 NOTE — Telephone Encounter (Signed)
Pt insurance is active and benefits verified through Medicare a/b Co-pay 0, DED $203/$192.07 met, out of pocket 0/0 met, co-insurance 20%. no pre-authorization required. Passport, 09/25/2019'@3' :12pm, REF# [id number]  2ndary insurance is active and benefits verified through [place]. Co-pay 0, DED 0/0 met, out of pocket 0/0 met, co-insurance 0. No pre-authorization required. Passport, 09/25/2019'@3' :26pm, REF# [id number]

## 2019-09-27 ENCOUNTER — Encounter: Payer: Self-pay | Admitting: Internal Medicine

## 2019-09-27 ENCOUNTER — Telehealth (HOSPITAL_COMMUNITY): Payer: Self-pay | Admitting: *Deleted

## 2019-09-27 NOTE — Telephone Encounter (Signed)
Received referral from Dr. Annamaria Boots for participation in pulmonary rehab, [place].

## 2019-09-28 ENCOUNTER — Other Ambulatory Visit: Payer: Self-pay

## 2019-09-28 ENCOUNTER — Ambulatory Visit (HOSPITAL_COMMUNITY): Payer: Medicare Other | Attending: Internal Medicine

## 2019-09-28 ENCOUNTER — Ambulatory Visit (HOSPITAL_BASED_OUTPATIENT_CLINIC_OR_DEPARTMENT_OTHER): Payer: Medicare Other

## 2019-09-28 DIAGNOSIS — I25119 Atherosclerotic heart disease of native coronary artery with unspecified angina pectoris: Secondary | ICD-10-CM | POA: Insufficient documentation

## 2019-09-28 DIAGNOSIS — I2729 Other secondary pulmonary hypertension: Secondary | ICD-10-CM | POA: Diagnosis not present

## 2019-09-28 DIAGNOSIS — I700 Atherosclerosis of aorta: Secondary | ICD-10-CM | POA: Insufficient documentation

## 2019-09-28 DIAGNOSIS — R0602 Shortness of breath: Secondary | ICD-10-CM | POA: Insufficient documentation

## 2019-09-28 LAB — MYOCARDIAL PERFUSION IMAGING
LV dias vol: 83 mL (ref 62–150)
LV sys vol: 34 mL
Peak HR: 108 {beats}/min
Rest HR: 88 {beats}/min
SDS: 1
SRS: 2
SSS: 3
TID: 0.95

## 2019-09-28 MED ORDER — REGADENOSON 0.4 MG/5ML IV SOLN
0.4000 mg | Freq: Once | INTRAVENOUS | Status: AC
  Administered 2019-09-28: 0.4 mg via INTRAVENOUS

## 2019-09-28 MED ORDER — TECHNETIUM TC 99M TETROFOSMIN IV KIT
32.5000 | PACK | Freq: Once | INTRAVENOUS | Status: AC | PRN
  Administered 2019-09-28: 32.5 via INTRAVENOUS
  Filled 2019-09-28: qty 33

## 2019-09-28 MED ORDER — TECHNETIUM TC 99M TETROFOSMIN IV KIT
10.9000 | PACK | Freq: Once | INTRAVENOUS | Status: AC | PRN
  Administered 2019-09-28: 10.9 via INTRAVENOUS
  Filled 2019-09-28: qty 11

## 2019-09-29 LAB — ECHOCARDIOGRAM COMPLETE
Height: 68 in
Weight: 2272 oz

## 2019-10-02 ENCOUNTER — Telehealth (HOSPITAL_COMMUNITY): Payer: Self-pay

## 2019-10-02 ENCOUNTER — Telehealth: Payer: Self-pay

## 2019-10-02 NOTE — Telephone Encounter (Signed)
Reviewed echo and myoview results with patient. He was grateful for assistance.

## 2019-10-02 NOTE — Telephone Encounter (Signed)
-----   Message from Sherren Mocha, MD sent at 10/01/2019  5:07 PM EST ----- Let's just mention it to him and offer to order if he wants, otherwise he could talk it over with Dr Annamaria Boots. If he wants to pursue it let me know and I'll do an addendum ----- Message ----- From: Theodoro Parma, RN Sent: 10/01/2019   3:02 PM EST To: Sherren Mocha, MD  Mikel Cella- If all we are ordering is a sleep study, will you make an addendum to your office note stating we are ordering PSG? That way we wont have to being him in for another face to face to order it.  Thanks! Valetta Fuller ----- Message ----- From: Sherren Mocha, MD Sent: 10/01/2019   9:04 AM EST To: Larey Dresser, MD, Theodoro Parma, RN  Thx [place] ----- Message ----- From: Larey Dresser, MD Sent: 09/30/2019  10:26 PM EST To: Sherren Mocha, MD  RV is enlarged with normal systolic function, unable to estimate PA pressure but IVC is normal appearing which is a good sign.  Almost certainly has pulmonary hypertension, but if severe COPD on home oxygen, unlikely to be much that you can do other than use oxygen.  Would get him a sleep study too. CMR probably won't change what you do, would not treat group 3 PH with selective pulmonary vasodilators.  ----- Message ----- From: Sherren Mocha, MD Sent: 09/30/2019   9:03 AM EST To: Larey Dresser, MD  Chapman Fitch - can you take a quick look at this echo? This patient has advanced O2 dependent lung disease and I got the echo for surveillance of right heart function since he continues to progress. I don't think there is anything to do in this situation but wanted to check with you. I thought about a CMR but if there's no treatment option I'm not inclined to pursue it. thx Ronalee Belts

## 2019-10-08 NOTE — Telephone Encounter (Signed)
Discussed with patient echo results.  Reiterated to him that pumping function of his heart is fine, and the other findings are direct results from his lung disease. Reviewed sleep study information and offered again as OSA could a treatable cause. He has an appointment with Dr. Annamaria Boots soon and will discuss findings with him then. He requests his results sent to him for review. He was grateful for assistance.

## 2019-10-11 ENCOUNTER — Encounter: Payer: Self-pay | Admitting: Internal Medicine

## 2019-10-12 ENCOUNTER — Encounter: Payer: Self-pay | Admitting: Internal Medicine

## 2019-10-18 ENCOUNTER — Telehealth (INDEPENDENT_AMBULATORY_CARE_PROVIDER_SITE_OTHER): Payer: Medicare Other | Admitting: Internal Medicine

## 2019-10-18 ENCOUNTER — Telehealth (HOSPITAL_COMMUNITY): Payer: Self-pay | Admitting: Internal Medicine

## 2019-10-18 ENCOUNTER — Other Ambulatory Visit: Payer: Self-pay

## 2019-10-18 DIAGNOSIS — J9611 Chronic respiratory failure with hypoxia: Secondary | ICD-10-CM | POA: Diagnosis not present

## 2019-10-18 DIAGNOSIS — J449 Chronic obstructive pulmonary disease, unspecified: Secondary | ICD-10-CM | POA: Diagnosis not present

## 2019-10-18 DIAGNOSIS — E538 Deficiency of other specified B group vitamins: Secondary | ICD-10-CM | POA: Diagnosis not present

## 2019-10-18 DIAGNOSIS — R5383 Other fatigue: Secondary | ICD-10-CM | POA: Diagnosis not present

## 2019-10-18 DIAGNOSIS — S32019S Unspecified fracture of first lumbar vertebra, sequela: Secondary | ICD-10-CM | POA: Diagnosis not present

## 2019-10-18 DIAGNOSIS — I25119 Atherosclerotic heart disease of native coronary artery with unspecified angina pectoris: Secondary | ICD-10-CM | POA: Diagnosis not present

## 2019-10-18 DIAGNOSIS — G603 Idiopathic progressive neuropathy: Secondary | ICD-10-CM | POA: Diagnosis not present

## 2019-10-18 DIAGNOSIS — D649 Anemia, unspecified: Secondary | ICD-10-CM | POA: Diagnosis not present

## 2019-10-18 DIAGNOSIS — I2510 Atherosclerotic heart disease of native coronary artery without angina pectoris: Secondary | ICD-10-CM

## 2019-10-18 NOTE — Progress Notes (Signed)
Patient ID: [name], male   DOB: 1935/02/05, 85 y.o.   MRN: [id number] This service is provided via telemedicine  No vital signs collected/recorded due to the encounter was a telemedicine visit.   Location of patient (ex: home, work): Home  Patient consents to a telephone visit: yes  Location of the provider (ex: office, home):[facility]  Name of any referring provider:  Dr. Hollace Kinnier DO  Names of all persons participating in the telemedicine service and their role in the encounter: Dr. Jonelle [address] DO, Charlene CMA and patient   Time spent on call:10 min with CMA  Virtual Visit via Video Note  I connected with [name] on 10/18/19 at 11:00 AM EST by a video enabled telemedicine application and verified that I am speaking with the correct person using two identifiers.  Location: Patient: home Provider: [facility] office   I discussed the limitations of evaluation and management by telemedicine and the availability of in person appointments. The patient expressed understanding and agreed to proceed.  History of Present Illness: 85 yo male that recently established care here at [facility].  He'd requested a virtual visit this time due to covid risks.  He has now had both vaccines.    Back is doing a lot better.  Taking acetaminophen extra strength two twice a day.  I did receive notes from neurosurgery in the past few days where kyphoplasty was still offered though pt is getting so much better in this regard that it may not be too helpful (pain rating was 3 in neurosurgery notes)..    Breathing difficulties, fatigue and weakness have not gotten much better.  Discussed idea of B12 supplementation due to borderline level in geriatrics.  Dr. Annamaria Boots recommended his hgb be 13-15 due to his COPD.  He's been ranging in the 11-12.   Lab Results  Component Value Date   HGB 12.5 (A) 08/02/2019     He did do some PT up until 2 months ago for his back.  He did not feel like it accomplished much.  It  was hard to breathe, wear the mask and use the oxygen.  He did not feel like his back got stronger from this.    He does have portable and home oxygen.  For the amount of oxygen he needs, there is not a smaller device.  A sleep study was recommended.  He does not snore.  He doubts he's got it b/c the breathing problems began more after he fell.    Observations/Objective: Pt awake alert and oriented, seated at his computer at his desk at home wearing his oxygen Appears comfortable, in NAD  Assessment and Plan: 1. B12 deficiency -more of an insufficiency at his age--I recommend his b12 be over 500  -will begin b12 1075mcg daily which he can purchase OTC  2. Chronic respiratory failure with hypoxia (HCC) -continue oxygen therapy per Dr. Annamaria Boots -I recommended he return to pulmonary rehab--looking at his overall picture and having reviewed his medical records, it appears to me that his decline/recent frailty since his fall, is due primarily to his progressing COPD, unfortunately -this was disappointing to him, but I'm hopeful that returning to pulmonary rehab will provide him with some energy conservation techniques and further nutrition guidance, get him exercising some again to get some more energy and muscle strength so he can be more active and feel better  3. COPD mixed type ([place]) -continue symbicort, spiriva, and oxygen therapy -followed by Dr. Annamaria Boots -resumed pulmonary  rehab now that back pain has improved--may still need some modifications   4. Normocytic anemia -mild -hopefully b12 repletion will help him some here  5. Coronary artery disease involving native coronary artery of native heart without angina pectoris -being managed by Dr. Burt Knack and had stress test [facility]) with Dr. Debara Pickett that was normal without blockage/low risk, EF 59%--sleep study recommended by cardiology but respectfully declined  6. Idiopathic progressive neuropathy -replete B12 -etiology not entirely  clear  7. Other fatigue -primarily related to lung disease progression is my impression along with anemia and deconditioning  8. Closed fracture of first lumbar vertebra, unspecified fracture morphology, sequela -pain improving with tylenol use, encouraged him to continue with that and return to pulmonary rehab  Follow Up Instructions: I discussed the assessment and treatment plan with the patient. The patient was provided an opportunity to ask questions and all were answered. The patient agreed with the plan and demonstrated an understanding of the instructions.   The patient was advised to call back or seek an in-person evaluation if the symptoms worsen or if the condition fails to improve as anticipated.  I provided 30 minutes of non-face-to-face time during this encounter including reviewing medical records from his specialists.   Lestat Golob L. Noa Constante, D.O. [facility] [address], [place] [address] Cell Phone (Mon-Fri 8am-5pm):  [phone] On Call:  [phone] & follow prompts after 5pm & weekends Office Phone:  [phone] Office Fax:  [phone]

## 2019-10-19 ENCOUNTER — Encounter: Payer: Self-pay | Admitting: Internal Medicine

## 2019-10-19 ENCOUNTER — Telehealth (HOSPITAL_COMMUNITY): Payer: Self-pay

## 2019-10-19 MED ORDER — VITAMIN B-12 1000 MCG PO TABS: 1000.0000 ug | ORAL_TABLET | Freq: Every day | ORAL | 5 refills | Status: AC

## 2019-10-19 NOTE — Addendum Note (Signed)
Addended by: Gayland Curry on: 10/19/2019 08:18 AM   Modules accepted: Orders

## 2019-10-20 ENCOUNTER — Encounter: Payer: Self-pay | Admitting: Internal Medicine

## 2019-10-29 DIAGNOSIS — H40013 Open angle with borderline findings, low risk, bilateral: Secondary | ICD-10-CM | POA: Diagnosis not present

## 2019-10-29 DIAGNOSIS — Z961 Presence of intraocular lens: Secondary | ICD-10-CM | POA: Diagnosis not present

## 2019-10-29 DIAGNOSIS — H04123 Dry eye syndrome of bilateral lacrimal glands: Secondary | ICD-10-CM | POA: Diagnosis not present

## 2019-10-31 ENCOUNTER — Encounter: Payer: Self-pay | Admitting: Internal Medicine

## 2019-10-31 ENCOUNTER — Other Ambulatory Visit: Payer: Self-pay | Admitting: Cardiovascular Disease

## 2019-10-31 MED ORDER — TAMSULOSIN HCL 0.4 MG PO CAPS: 0.4000 mg | ORAL_CAPSULE | Freq: Every day | ORAL | 1 refills | Status: DC

## 2019-10-31 MED ORDER — PRAVASTATIN SODIUM 40 MG PO TABS: 40.0000 mg | ORAL_TABLET | Freq: Every day | ORAL | 1 refills | Status: DC

## 2019-10-31 MED ORDER — SPIRIVA RESPIMAT 2.5 MCG/ACT IN AERS: 2.0000 | INHALATION_SPRAY | Freq: Every day | RESPIRATORY_TRACT | 3 refills | Status: DC

## 2019-11-05 ENCOUNTER — Telehealth (HOSPITAL_COMMUNITY): Payer: Self-pay | Admitting: Internal Medicine

## 2019-11-06 ENCOUNTER — Ambulatory Visit (HOSPITAL_COMMUNITY): Payer: Medicare Other

## 2019-11-06 ENCOUNTER — Telehealth (HOSPITAL_COMMUNITY): Payer: Self-pay | Admitting: *Deleted

## 2019-11-06 NOTE — Telephone Encounter (Signed)
Returned phone call re: where his care taker may wait while he is in the pulmonary rehab walk test/orientation.

## 2019-11-07 ENCOUNTER — Encounter (HOSPITAL_COMMUNITY): Payer: Self-pay

## 2019-11-07 ENCOUNTER — Encounter (HOSPITAL_COMMUNITY)
Admission: RE | Admit: 2019-11-07 | Discharge: 2019-11-07 | Disposition: A | Discharge status: Home / Self Care | Payer: Medicare Other | Source: Ambulatory Visit | Attending: Internal Medicine | Admitting: Internal Medicine

## 2019-11-07 ENCOUNTER — Other Ambulatory Visit: Payer: Self-pay

## 2019-11-07 VITALS — BP 124/60 | HR 104 | Ht 67.5 in | Wt 144.2 lb

## 2019-11-07 DIAGNOSIS — J9611 Chronic respiratory failure with hypoxia: Secondary | ICD-10-CM | POA: Diagnosis not present

## 2019-11-07 NOTE — Progress Notes (Signed)
[name] 85 y.o. male Pulmonary Rehab Orientation Note Patient arrived today in Cardiac and Pulmonary Rehab for orientation to Pulmonary Rehab. He was dropped off by his care giver at the [facility] entrance, he was using a walker and carrying portable liquid oxygen.  Per pt, he uses oxygen continuously. Color good, skin warm and dry. Patient is oriented to time and place. Patient's medical history, psychosocial health, and medications reviewed. Psychosocial assessment reveals pt lives with their spouse. Pt is currently retired. He fell 1 year ago and had a lumbar 1 compression fracture and has become very deconditioned since.  He has is hopeful that pulmonary rehab will assist him in getting his strength back.  Pt does not exhibit signs of depression. PHQ2/9 score 0/2. Pt shows good  coping skills with positive outlook . Will continue to monitor and evaluate progress toward psychosocial goal(s) of no barriers or psychosocial concerns while participating in pulmonary rehab. Physical assessment reveals heart rate is normal. Grip strength equal, strong. Patient reports he  does take medications as prescribed. Patient states he follows a Regular diet. He states he has lost 25 pounds since his compression fracture and feels like he has no appetite.. Patient's weight will be monitored closely. Demonstration and practice of PLB using pulse oximeter. Patient able to return demonstration satisfactorily. Safety and hand hygiene in the exercise area reviewed with patient. Patient voices understanding of the information reviewed. Department expectations discussed with patient and achievable goals were set. The patient shows enthusiasm about attending the program and we look forward to working with this nice gentleman. The patient completed a 6 min walk test today 11/07/2019  and will begin exercise on Tuesday 11/13/2019 in the 1030 class.  [id number]

## 2019-11-07 NOTE — Progress Notes (Signed)
Pulmonary Individual Treatment Plan  Patient Details  Name: [name] MRN: [id number] Date of Birth: 20-Jan-1935 Referring Provider:     Pulmonary Rehab Walk Test from 11/07/2019 in [name]  Referring Provider  Dr. Annamaria Boots      Initial Encounter Date:    Pulmonary Rehab Walk Test from 11/07/2019 in [place]  Date  11/07/19      Visit Diagnosis: Chronic respiratory failure with hypoxia ([place])  Patient's Home Medications on Admission:   Current Outpatient Medications:  .  acetaminophen (TYLENOL) 500 MG tablet, Take 500 mg by mouth See admin instructions. 2 - 4 times a day, Disp: , Rfl:  .  aspirin EC 81 MG tablet, Take 1 tablet (81 mg total) by mouth daily., Disp: , Rfl:  .  losartan (COZAAR) 25 MG tablet, TAKE 1 TABLET(25 MG) BY MOUTH DAILY, Disp: 90 tablet, Rfl: 2 .  Nutritional Supplements (ENSURE HIGH PROTEIN) LIQD, Take 1 Bottle by mouth daily., Disp: , Rfl:  .  OXYGEN, Inhale 3-6 L into the lungs continuous. , Disp: , Rfl:  .  polyethylene glycol powder (GLYCOLAX/MIRALAX) powder, Take 17 g by mouth daily., Disp: , Rfl:  .  pravastatin (PRAVACHOL) 40 MG tablet, Take 1 tablet (40 mg total) by mouth daily., Disp: 90 tablet, Rfl: 1 .  SYMBICORT 160-4.5 MCG/ACT inhaler, INHALE 2 PUFFS BY MOUTH INTO THE LUNGS TWICE DAILY. RINSE MOUTH, Disp: 10.2 g, Rfl: 5 .  tamsulosin (FLOMAX) 0.4 MG CAPS capsule, Take 1 capsule (0.4 mg total) by mouth daily., Disp: 90 capsule, Rfl: 1 .  Tiotropium Bromide Monohydrate (SPIRIVA RESPIMAT) 2.5 MCG/ACT AERS, Inhale 2 puffs into the lungs daily., Disp: 12 g, Rfl: 3 .  vitamin B-12 (CYANOCOBALAMIN) 1000 MCG tablet, Take 1 tablet (1,000 mcg total) by mouth daily., Disp: 30 tablet, Rfl: 5  Past Medical History: Past Medical History:  Diagnosis Date  . Allergic rhinitis, cause unspecified   . Arthritis   . COPD (chronic obstructive pulmonary disease) ([place])   . Dyspnea   . Dysrhythmia     "1 episode of tachycardia in 1980's, been on it ever since"  . Emphysema of lung ([place])   . Hypertension   . Left inguinal hernia 02/07/2019  . Low hemoglobin    low level  . Neuromuscular disorder ([place])   . Neuropathy    feet  . Pneumonia    History of, last Oct 2019  . Pulmonary nodule     Tobacco Use: Social History   Tobacco Use  Smoking Status Former Smoker  . Packs/day: 2.00  . Years: 38.00  . Pack years: 76.00  . Types: Cigarettes  . Quit date: 08/16/1990  . Years since quitting: 29.2  Smokeless Tobacco Never Used    Labs: Recent Review Flowsheet Data    There is no flowsheet data to display.      Capillary Blood Glucose: Lab Results  Component Value Date   GLUCAP 106 (H) 02/07/2019   GLUCAP 107 (H) 12/21/2010     Pulmonary Assessment Scores: Pulmonary Assessment Scores    Row Name 11/07/19 1208 11/07/19 1211       ADL UCSD   ADL Phase  Entry  Entry    SOB Score total  --  90      CAT Score   CAT Score  --  28      mMRC Score   mMRC Score  4  --  UCSD: Self-administered rating of dyspnea associated with activities of daily living (ADLs) 6-point scale (0 = "not at all" to 5 = "maximal or unable to do because of breathlessness")  Scoring Scores range from 0 to 120.  Minimally important difference is 5 units  CAT: CAT can identify the health impairment of COPD patients and is better correlated with disease progression.  CAT has a scoring range of zero to 40. The CAT score is classified into four groups of low (less than 10), medium (10 - 20), high (21-30) and very high (31-40) based on the impact level of disease on health status. A CAT score over 10 suggests significant symptoms.  A worsening CAT score could be explained by an exacerbation, poor medication adherence, poor inhaler technique, or progression of COPD or comorbid conditions.  CAT MCID is 2 points  mMRC: mMRC (Modified Medical Research Council) Dyspnea Scale is used to assess the  degree of baseline functional disability in patients of respiratory disease due to dyspnea. No minimal important difference is established. A decrease in score of 1 point or greater is considered a positive change.   Pulmonary Function Assessment: Pulmonary Function Assessment - 11/07/19 1211      Breath   Shortness of Breath  Yes;Limiting activity       Exercise Target Goals: Exercise Program Goal: Individual exercise prescription set using results from initial 6 min walk test and THRR while considering  patient's activity barriers and safety.   Exercise Prescription Goal: Initial exercise prescription builds to 30-45 minutes a day of aerobic activity, 2-3 days per week.  Home exercise guidelines will be given to patient during program as part of exercise prescription that the participant will acknowledge.  Activity Barriers & Risk Stratification: Activity Barriers & Cardiac Risk Stratification - 11/07/19 1105      Activity Barriers & Cardiac Risk Stratification   Activity Barriers  Back Problems   L1 compression fracture 1 year ago      6 Minute Walk: 6 Minute Walk    Row Name 11/07/19 1210         6 Minute Walk   Phase  Initial     Distance  634 feet     Walk Time  5.5 minutes     # of Rest Breaks  1     MPH  1.2     METS  1.69     RPE  18     Perceived Dyspnea   3     VO2 Peak  5.93     Symptoms  Yes (comment)     Comments  pushed wheelchair. 1 standing rest break for 30 sec due to general fatigue     Resting HR  105 bpm     Resting BP  124/60     Resting Oxygen Saturation   100 %     Exercise Oxygen Saturation  during 6 min walk  99 %     Max Ex. HR  109 bpm     Max Ex. BP  156/64     2 Minute Post BP  118/56       Interval HR   1 Minute HR  106     2 Minute HR  106     3 Minute HR  107     4 Minute HR  109     5 Minute HR  108     6 Minute HR  102     2 Minute Post HR  102     Interval Heart Rate?  Yes       Interval Oxygen   Interval Oxygen?  Yes      Baseline Oxygen Saturation %  100 %     1 Minute Oxygen Saturation %  100 %     1 Minute Liters of Oxygen  4 L     2 Minute Oxygen Saturation %  100 %     2 Minute Liters of Oxygen  4 L     3 Minute Oxygen Saturation %  100 %     3 Minute Liters of Oxygen  4 L     4 Minute Oxygen Saturation %  100 %     4 Minute Liters of Oxygen  4 L     5 Minute Oxygen Saturation %  100 %     5 Minute Liters of Oxygen  4 L     6 Minute Oxygen Saturation %  99 %     6 Minute Liters of Oxygen  4 L     2 Minute Post Oxygen Saturation %  100 %     2 Minute Post Liters of Oxygen  4 L        Oxygen Initial Assessment: Oxygen Initial Assessment - 11/07/19 1207      Home Oxygen   Home Oxygen Device  Liquid Oxygen    Sleep Oxygen Prescription  Continuous    Liters per minute  4    Home Exercise Oxygen Prescription  Continuous    Liters per minute  4    Home at Rest Exercise Oxygen Prescription  Continuous    Liters per minute  2    Compliance with Home Oxygen Use  Yes      Initial 6 min Walk   Oxygen Used  Continuous    Liters per minute  4      Program Oxygen Prescription   Program Oxygen Prescription  Continuous    Liters per minute  4      Intervention   Short Term Goals  To learn and exhibit compliance with exercise, home and travel O2 prescription;To learn and understand importance of monitoring SPO2 with pulse oximeter and demonstrate accurate use of the pulse oximeter.;To learn and understand importance of maintaining oxygen saturations>88%;To learn and demonstrate proper pursed lip breathing techniques or other breathing techniques.;To learn and demonstrate proper use of respiratory medications    Long  Term Goals  Exhibits compliance with exercise, home and travel O2 prescription;Verbalizes importance of monitoring SPO2 with pulse oximeter and return demonstration;Maintenance of O2 saturations>88%;Exhibits proper breathing techniques, such as pursed lip breathing or other method  taught during program session;Compliance with respiratory medication;Demonstrates proper use of MDI's       Oxygen Re-Evaluation:   Oxygen Discharge (Final Oxygen Re-Evaluation):   Initial Exercise Prescription: Initial Exercise Prescription - 11/07/19 1200      Date of Initial Exercise RX and Referring Provider   Date  11/07/19    Referring Provider  Dr. Annamaria Boots      Oxygen   Oxygen  Continuous    Liters  4      T5 Nustep   Level  2    SPM  80    Minutes  30      Prescription Details   Frequency (times per week)  2    Duration  Progress to 30 minutes of continuous aerobic without signs/symptoms of physical distress      Intensity  THRR 40-80% of Max Heartrate  55-110    Ratings of Perceived Exertion  11-13    Perceived Dyspnea  0-4      Progression   Progression  Continue to progress workloads to maintain intensity without signs/symptoms of physical distress.      Resistance Training   Training Prescription  Yes    Weight  orange bands    Reps  10-15       Perform Capillary Blood Glucose checks as needed.  Exercise Prescription Changes:   Exercise Comments:   Exercise Goals and Review: Exercise Goals    Row Name 11/07/19 1219             Exercise Goals   Increase Physical Activity  Yes       Intervention  Provide advice, education, support and counseling about physical activity/exercise needs.;Develop an individualized exercise prescription for aerobic and resistive training based on initial evaluation findings, risk stratification, comorbidities and participant's personal goals.       Expected Outcomes  Short Term: Attend rehab on a regular basis to increase amount of physical activity.;Long Term: Add in home exercise to make exercise part of routine and to increase amount of physical activity.;Long Term: Exercising regularly at least 3-5 days a week.       Increase Strength and Stamina  Yes       Intervention  Provide advice, education, support and  counseling about physical activity/exercise needs.;Develop an individualized exercise prescription for aerobic and resistive training based on initial evaluation findings, risk stratification, comorbidities and participant's personal goals.       Expected Outcomes  Short Term: Increase workloads from initial exercise prescription for resistance, speed, and METs.;Short Term: Perform resistance training exercises routinely during rehab and add in resistance training at home;Long Term: Improve cardiorespiratory fitness, muscular endurance and strength as measured by increased METs and functional capacity (6MWT)       Able to understand and use rate of perceived exertion (RPE) scale  Yes       Intervention  Provide education and explanation on how to use RPE scale       Expected Outcomes  Short Term: Able to use RPE daily in rehab to express subjective intensity level;Long Term:  Able to use RPE to guide intensity level when exercising independently       Able to understand and use Dyspnea scale  Yes       Intervention  Provide education and explanation on how to use Dyspnea scale       Expected Outcomes  Short Term: Able to use Dyspnea scale daily in rehab to express subjective sense of shortness of breath during exertion;Long Term: Able to use Dyspnea scale to guide intensity level when exercising independently       Knowledge and understanding of Target Heart Rate Range (THRR)  Yes       Intervention  Provide education and explanation of THRR including how the numbers were predicted and where they are located for reference       Expected Outcomes  Short Term: Able to state/look up THRR;Short Term: Able to use daily as guideline for intensity in rehab;Long Term: Able to use THRR to govern intensity when exercising independently       Understanding of Exercise Prescription  Yes       Intervention  Provide education, explanation, and written materials on patient's individual exercise prescription        Expected Outcomes  Short Term: Able to explain program  exercise prescription;Long Term: Able to explain home exercise prescription to exercise independently          Exercise Goals Re-Evaluation :   Discharge Exercise Prescription (Final Exercise Prescription Changes):   Nutrition:  Target Goals: Understanding of nutrition guidelines, daily intake of sodium 1500mg , cholesterol 200mg , calories 30% from fat and 7% or less from saturated fats, daily to have 5 or more servings of fruits and vegetables.  Biometrics: Pre Biometrics - 11/07/19 1105      Pre Biometrics   Grip Strength  28 kg        Nutrition Therapy Plan and Nutrition Goals:   Nutrition Assessments:   Nutrition Goals Re-Evaluation:   Nutrition Goals Discharge (Final Nutrition Goals Re-Evaluation):   Psychosocial: Target Goals: Acknowledge presence or absence of significant depression and/or stress, maximize coping skills, provide positive support system. Participant is able to verbalize types and ability to use techniques and skills needed for reducing stress and depression.  Initial Review & Psychosocial Screening: Initial Psych Review & Screening - 11/07/19 1213      Initial Review   Current issues with  None Identified      Family Dynamics   Good Support System?  Yes      Barriers   Psychosocial barriers to participate in program  There are no identifiable barriers or psychosocial needs.      Screening Interventions   Interventions  Encouraged to exercise       Quality of Life Scores:  Scores of 19 and below usually indicate a poorer quality of life in these areas.  A difference of  2-3 points is a clinically meaningful difference.  A difference of 2-3 points in the total score of the Quality of Life Index has been associated with significant improvement in overall quality of life, self-image, physical symptoms, and general health in studies assessing change in quality of life.  PHQ-9: Recent  Review Flowsheet Data    Depression screen [facility] 2/9 11/07/2019 10/18/2019 09/20/2019   Decreased Interest 0 0 0   Down, Depressed, Hopeless 0 0 0   PHQ - 2 Score 0 0 0   Altered sleeping 0 - -   Tired, decreased energy 1 - -   Change in appetite 1 - -   Feeling bad or failure about yourself  0 - -   Trouble concentrating 0 - -   Moving slowly or fidgety/restless 0 - -   Suicidal thoughts 0 - -   PHQ-9 Score 2 - -   Difficult doing work/chores Not difficult at all - -     Interpretation of Total Score  Total Score Depression Severity:  1-4 = Minimal depression, 5-9 = Mild depression, 10-14 = Moderate depression, 15-19 = Moderately severe depression, 20-27 = Severe depression   Psychosocial Evaluation and Intervention: Psychosocial Evaluation - 11/07/19 1213      Psychosocial Evaluation & Interventions   Interventions  Encouraged to exercise with the program and follow exercise prescription    Comments  No psychosocial concerns identified at this time    Continue Psychosocial Services   No Follow up required       Psychosocial Re-Evaluation:   Psychosocial Discharge (Final Psychosocial Re-Evaluation):   Education: Education Goals: Education classes will be provided on a weekly basis, covering required topics. Participant will state understanding/return demonstration of topics presented.  Learning Barriers/Preferences:   Education Topics: Risk Factor Reduction:  -Group instruction that is supported by a PowerPoint presentation. Instructor discusses the definition of a  risk factor, different risk factors for pulmonary disease, and how the heart and lungs work together.     Nutrition for Pulmonary Patient:  -Group instruction provided by PowerPoint slides, verbal discussion, and written materials to support subject matter. The instructor gives an explanation and review of healthy diet recommendations, which includes a discussion on weight management, recommendations for fruit and  vegetable consumption, as well as protein, fluid, caffeine, fiber, sodium, sugar, and alcohol. Tips for eating when patients are short of breath are discussed.   Pursed Lip Breathing:  -Group instruction that is supported by demonstration and informational handouts. Instructor discusses the benefits of pursed lip and diaphragmatic breathing and detailed demonstration on how to preform both.     Oxygen Safety:  -Group instruction provided by PowerPoint, verbal discussion, and written material to support subject matter. There is an overview of "What is Oxygen" and "Why do we need it".  Instructor also reviews how to create a safe environment for oxygen use, the importance of using oxygen as prescribed, and the risks of noncompliance. There is a brief discussion on traveling with oxygen and resources the patient may utilize.   Oxygen Equipment:  -Group instruction provided by [facility] Staff utilizing handouts, written materials, and equipment demonstrations.   Signs and Symptoms:  -Group instruction provided by written material and verbal discussion to support subject matter. Warning signs and symptoms of infection, stroke, and heart attack are reviewed and when to call the physician/911 reinforced. Tips for preventing the spread of infection discussed.   Advanced Directives:  -Group instruction provided by verbal instruction and written material to support subject matter. Instructor reviews Advanced Directive laws and proper instruction for filling out document.   Pulmonary Video:  -Group video education that reviews the importance of medication and oxygen compliance, exercise, good nutrition, pulmonary hygiene, and pursed lip and diaphragmatic breathing for the pulmonary patient.   Exercise for the Pulmonary Patient:  -Group instruction that is supported by a PowerPoint presentation. Instructor discusses benefits of exercise, core components of exercise, frequency, duration, and  intensity of an exercise routine, importance of utilizing pulse oximetry during exercise, safety while exercising, and options of places to exercise outside of rehab.     Pulmonary Medications:  -Verbally interactive group education provided by instructor with focus on inhaled medications and proper administration.   Anatomy and Physiology of the Respiratory System and Intimacy:  -Group instruction provided by PowerPoint, verbal discussion, and written material to support subject matter. Instructor reviews respiratory cycle and anatomical components of the respiratory system and their functions. Instructor also reviews differences in obstructive and restrictive respiratory diseases with examples of each. Intimacy, Sex, and Sexuality differences are reviewed with a discussion on how relationships can change when diagnosed with pulmonary disease. Common sexual concerns are reviewed.   MD DAY -A group question and answer session with a medical doctor that allows participants to ask questions that relate to their pulmonary disease state.   OTHER EDUCATION -Group or individual verbal, written, or video instructions that support the educational goals of the pulmonary rehab program.   Holiday Eating Survival Tips:  -Group instruction provided by PowerPoint slides, verbal discussion, and written materials to support subject matter. The instructor gives patients tips, tricks, and techniques to help them not only survive but enjoy the holidays despite the onslaught of food that accompanies the holidays.   Knowledge Questionnaire Score: Knowledge Questionnaire Score - 11/07/19 1214      Knowledge Questionnaire Score   Pre Score  16/18       Core Components/Risk Factors/Patient Goals at Admission: Personal Goals and Risk Factors at Admission - 11/07/19 1217      Core Components/Risk Factors/Patient Goals on Admission   Improve shortness of breath with ADL's  Yes    Intervention  Provide  education, individualized exercise plan and daily activity instruction to help decrease symptoms of SOB with activities of daily living.    Expected Outcomes  Short Term: Improve cardiorespiratory fitness to achieve a reduction of symptoms when performing ADLs;Long Term: Be able to perform more ADLs without symptoms or delay the onset of symptoms       Core Components/Risk Factors/Patient Goals Review:  Goals and Risk Factor Review    Row Name 11/07/19 1218             Core Components/Risk Factors/Patient Goals Review   Personal Goals Review  Develop more efficient breathing techniques such as purse lipped breathing and diaphragmatic breathing and practicing self-pacing with activity.;Increase knowledge of respiratory medications and ability to use respiratory devices properly.;Improve shortness of breath with ADL's          Core Components/Risk Factors/Patient Goals at Discharge (Final Review):  Goals and Risk Factor Review - 11/07/19 1218      Core Components/Risk Factors/Patient Goals Review   Personal Goals Review  Develop more efficient breathing techniques such as purse lipped breathing and diaphragmatic breathing and practicing self-pacing with activity.;Increase knowledge of respiratory medications and ability to use respiratory devices properly.;Improve shortness of breath with ADL's       ITP Comments:   Comments:

## 2019-11-13 ENCOUNTER — Other Ambulatory Visit: Payer: Self-pay

## 2019-11-13 ENCOUNTER — Encounter (HOSPITAL_COMMUNITY)
Admission: RE | Admit: 2019-11-13 | Discharge: 2019-11-13 | Disposition: A | Discharge status: Home / Self Care | Payer: Medicare Other | Source: Ambulatory Visit | Attending: Internal Medicine | Admitting: Internal Medicine

## 2019-11-13 DIAGNOSIS — J9611 Chronic respiratory failure with hypoxia: Secondary | ICD-10-CM

## 2019-11-13 NOTE — Progress Notes (Signed)
Daily Session Note  Patient Details  Name: [name] MRN: [id number] Date of Birth: October 09, 1934 Referring Provider:     Pulmonary Rehab Walk Test from 11/07/2019 in [place]  Referring Provider  Dr. Annamaria Boots      Encounter Date: 11/13/2019  Check In: Session Check In - 11/13/19 1139      Check-In   Supervising physician immediately available to respond to emergencies  [facility] immediately available    Physician(s)  Dr. Sharlet Salina    Location  [facility]    Staff Present  Rosebud Poles, RN, Bjorn Loser, MS, Exercise Physiologist;Ishitha Roper Ysidro Evert, RN    Virtual Visit  No    Medication changes reported      No    Fall or balance concerns reported     No    Tobacco Cessation  No Change    Warm-up and Cool-down  Performed as group-led instruction    Resistance Training Performed  Yes    VAD Patient?  No    PAD/SET Patient?  No      Pain Assessment   Currently in Pain?  No/denies    Multiple Pain Sites  No       Capillary Blood Glucose: No results found for this or any previous visit (from the past 24 hour(s)).    Social History   Tobacco Use  Smoking Status Former Smoker  . Packs/day: 2.00  . Years: 38.00  . Pack years: 76.00  . Types: Cigarettes  . Quit date: 08/16/1990  . Years since quitting: 29.2  Smokeless Tobacco Never Used    Goals Met:  Exercise tolerated well No report of cardiac concerns or symptoms Strength training completed today  Goals Unmet:  Not Applicable  Comments: Service time is from 1020 to 1125    Dr. Fransico Him is Medical Director for Cardiac Rehab at [facility].

## 2019-11-15 ENCOUNTER — Other Ambulatory Visit: Payer: Self-pay

## 2019-11-15 ENCOUNTER — Encounter (HOSPITAL_COMMUNITY)
Admission: RE | Admit: 2019-11-15 | Discharge: 2019-11-15 | Disposition: A | Discharge status: Home / Self Care | Payer: Medicare Other | Source: Ambulatory Visit | Attending: Internal Medicine | Admitting: Internal Medicine

## 2019-11-15 DIAGNOSIS — J9611 Chronic respiratory failure with hypoxia: Secondary | ICD-10-CM | POA: Diagnosis not present

## 2019-11-15 NOTE — Progress Notes (Signed)
Daily Session Note  Patient Details  Name: [name] MRN: [id number] Date of Birth: 05-08-1935 Referring Provider:     Pulmonary Rehab Walk Test from 11/07/2019 in [place]  Referring Provider  Dr. Annamaria Boots      Encounter Date: 11/15/2019  Check In: Session Check In - 11/15/19 1120      Check-In   Supervising physician immediately available to respond to emergencies  [facility] immediately available    Physician(s)  Dr. Loleta Books    Location  [facility]    Staff Present  Rosebud Poles, RN, BSN;Rand Boller Ysidro Evert, RN;Dalton Fletcher, MS, Exercise Physiologist    Virtual Visit  No    Medication changes reported      No    Fall or balance concerns reported     No    Tobacco Cessation  No Change    Warm-up and Cool-down  Performed as group-led instruction    Resistance Training Performed  Yes    VAD Patient?  No    PAD/SET Patient?  No      Pain Assessment   Currently in Pain?  No/denies    Multiple Pain Sites  No       Capillary Blood Glucose: No results found for this or any previous visit (from the past 24 hour(s)).    Social History   Tobacco Use  Smoking Status Former Smoker  . Packs/day: 2.00  . Years: 38.00  . Pack years: 76.00  . Types: Cigarettes  . Quit date: 08/16/1990  . Years since quitting: 29.2  Smokeless Tobacco Never Used    Goals Met:  No report of cardiac concerns or symptoms Strength training completed today  Goals Unmet:  Not Applicable  Comments: Service time is from 1025 to 1125    Dr. Fransico Him is Medical Director for Cardiac Rehab at [facility].

## 2019-11-20 ENCOUNTER — Other Ambulatory Visit: Payer: Self-pay

## 2019-11-20 ENCOUNTER — Encounter (HOSPITAL_COMMUNITY)
Admission: RE | Admit: 2019-11-20 | Discharge: 2019-11-20 | Disposition: A | Discharge status: Home / Self Care | Payer: Medicare Other | Source: Ambulatory Visit | Attending: Internal Medicine | Admitting: Internal Medicine

## 2019-11-20 VITALS — Wt 143.1 lb

## 2019-11-20 DIAGNOSIS — J9611 Chronic respiratory failure with hypoxia: Secondary | ICD-10-CM

## 2019-11-20 NOTE — Progress Notes (Signed)
Daily Session Note  Patient Details  Name: [name] MRN: [id number] Date of Birth: [birth year] Referring Provider:     Pulmonary Rehab Walk Test from 11/07/2019 in [facility]  Referring Provider  Dr. Annamaria Boots      Encounter Date: 11/20/2019  Check In: Session Check In - 11/20/19 1124      Check-In   Supervising physician immediately available to respond to emergencies  [facility] immediately available    Physician(s)  Dr. Broadus John    Location  [facility]    Staff Present  Rosebud Poles, RN, Bjorn Loser, MS, Exercise Physiologist;Lisa Ysidro Evert, RN    Virtual Visit  No    Medication changes reported      No    Fall or balance concerns reported     No    Tobacco Cessation  No Change    Warm-up and Cool-down  Performed as group-led instruction    Resistance Training Performed  Yes    VAD Patient?  No    PAD/SET Patient?  No      Pain Assessment   Currently in Pain?  No/denies    Multiple Pain Sites  No       Capillary Blood Glucose: No results found for this or any previous visit (from the past 24 hour(s)).  Exercise Prescription Changes - 11/20/19 1100      Response to Exercise   Blood Pressure (Admit)  144/70    Blood Pressure (Exercise)  134/70    Blood Pressure (Exit)  108/50    Heart Rate (Admit)  95 bpm    Heart Rate (Exercise)  93 bpm    Heart Rate (Exit)  100 bpm    Oxygen Saturation (Admit)  99 %    Oxygen Saturation (Exercise)  100 %    Oxygen Saturation (Exit)  100 %    Rating of Perceived Exertion (Exercise)  15    Perceived Dyspnea (Exercise)  3    Duration  Continue with 30 min of aerobic exercise without signs/symptoms of physical distress.    Intensity  THRR unchanged      Progression   Progression  Continue to progress workloads to maintain intensity without signs/symptoms of physical distress.      Resistance Training   Training Prescription  Yes    Weight  orange bands    Reps   10-15    Time  10 Minutes      Oxygen   Oxygen  Continuous    Liters  4      T5 Nustep   Level  2    SPM  80    Minutes  30    METs  1.5       Social History   Tobacco Use  Smoking Status Former Smoker  . Packs/day: 2.00  . Years: 38.00  . Pack years: 76.00  . Types: Cigarettes  . Quit date: 08/16/1990  . Years since quitting: 29.2  Smokeless Tobacco Never Used    Goals Met:  Independence with exercise equipment Exercise tolerated well Strength training completed today  Goals Unmet:  Not Applicable  Comments: Service time is from 1020 to 1125    Dr. Fransico Him is Medical Director for Cardiac Rehab at [facility].

## 2019-11-21 ENCOUNTER — Ambulatory Visit: Payer: Medicare Other | Admitting: Internal Medicine

## 2019-11-22 ENCOUNTER — Ambulatory Visit (INDEPENDENT_AMBULATORY_CARE_PROVIDER_SITE_OTHER): Payer: Medicare Other

## 2019-11-22 ENCOUNTER — Encounter: Payer: Self-pay | Admitting: Internal Medicine

## 2019-11-22 ENCOUNTER — Ambulatory Visit (INDEPENDENT_AMBULATORY_CARE_PROVIDER_SITE_OTHER): Payer: Medicare Other | Admitting: Internal Medicine

## 2019-11-22 ENCOUNTER — Other Ambulatory Visit: Payer: Self-pay

## 2019-11-22 ENCOUNTER — Encounter (HOSPITAL_COMMUNITY): Payer: Medicare Other

## 2019-11-22 VITALS — BP 118/70 | HR 94 | Temp 97.2°F | Ht 67.5 in | Wt 142.0 lb

## 2019-11-22 DIAGNOSIS — J449 Chronic obstructive pulmonary disease, unspecified: Secondary | ICD-10-CM

## 2019-11-22 DIAGNOSIS — J9611 Chronic respiratory failure with hypoxia: Secondary | ICD-10-CM

## 2019-11-22 DIAGNOSIS — I25119 Atherosclerotic heart disease of native coronary artery with unspecified angina pectoris: Secondary | ICD-10-CM

## 2019-11-22 MED ORDER — BREZTRI AEROSPHERE 160-9-4.8 MCG/ACT IN AERO: 2.0000 | INHALATION_SPRAY | Freq: Two times a day (BID) | RESPIRATORY_TRACT | 0 refills | Status: DC

## 2019-11-22 NOTE — Patient Instructions (Signed)
Order- CXR- DX COPD mixed type  Sample x 3 Breztri inhaler    Inhale 2 puffs, then rinse mouth, twice daily Try this instead of Symbicort and Spiriva. If you are satisfied that [name] works better for you, then let us know, so we can send a prescription and start the application for Prior Authorization from [employer].  Please call if we can help

## 2019-11-22 NOTE — Progress Notes (Signed)
Patient ID: [name], male    DOB: 25-Dec-1934, 85 y.o.   MRN: [id number] PCP Dr Hulan Fess  HPI male former smoker followed for COPD/emphysema, pulmonary nodules, hypoxic respiratory failure, complicated by CAD [id number]  MM PFT 04/27/11- Echocardiogram 07/31/2018-EF 55-60%, grade 1 DD, PHN 49 mmHg ------------------------------------------------------------------------------    07/23/2019- . 85 year old male former smoker followed for COPD/emphysema, pulmonary nodules, hypoxic respiratory failure, complicated by CAD, Anemia,  O2 3 - 4L/[employer] Proair hfa, neb Duoneb, Spiriva 2.5 Respimat, Symbicort 160,  Comes with nursing aide, rolling walker and back brace after fall with vertebral fx. Was bed-bound for a month and notes persistent worse DOE since that fall.  Cough productive clear. Seen by Hematology for normocytic anemia. Not in pulm rehab for the past year. Had dropped O2 tank on leg> RLE wrapped.  11/22/19-  85 year old male former smoker followed for COPD/emphysema, pulmonary nodules, hypoxic respiratory failure, complicated by CAD, Anemia,  O2 3 - 4L/[employer] Helpful message from [place] noting that oximetry records much lower on his finger than w forehead probe. Pt was resistant to trying lower O2 settings until he could discuss with [place].  -----f/u COPD O2 4l/min O2 sat finger 99% on 4 L    Cold fingers Forehead sat 100% Had 2 [employer] Symbicort 160, Spiriva 2.5,  Thoracic vertebral fx after a fall. Pain gone but still unstable, using walker. More DOE since then. Didn't like Trelegy, but says sample Breztri worked better than Symbicort. Explained that he can get SOB without desaturating- lower O2 can be appropriate.  On Vit B12 for anemia.  Checked O2 sat here with simultaneous forehead probe ((100%) and finger probe       ( 98%) on 2L.   Review of Systems-see HPI   + = positive Constitutional:   No-   weight loss, night sweats fevers, no-chills, fatigue,  lassitude. HEENT:   No-  headaches, difficulty swallowing, tooth/dental problems, sore throat,       No-  sneezing, itching, ear ache, nasal congestion, post nasal drip,  CV:  No-chest pain, no-orthopnea, PND, No-swelling in lower extremities, anasarca, dizziness, palpitations Resp: + shortness of breath with exertion, not at rest.               cough,  + non-productive cough,  No-  coughing up of blood.              No change color of mucus.  No- wheezing.   Skin: No-   rash or lesions. GI:  No-   heartburn, indigestion, abdominal pain, nausea,   GU: No-   dysuria,  MS:  No-   joint pain or swelling.  Neuro-  Psych:  No- change in mood or affect. No acute depression or anxiety.  No memory loss.  OBJ-    + looks frail General- Alert, Oriented, Affect-appropriate, Distress- none acute. + O2 4L , rolling walker, slender Skin-  Lymphadenopathy- none Head- atraumatic            Eyes- Gross vision intact, PERRLA, conjunctivae clear secretions            Ears- Hearing, canals-normal            Nose- Clear, no-Septal dev, mucus, polyps, erosion, perforation             Throat- Mallampati II , mucosa clear , drainage- none, tonsils- atrophic.  Neck- flexible , trachea midline, no stridor , thyroid nl, carotid no bruit Chest -  symmetrical excursion , unlabored           Heart/CV- RRR , no murmur , no gallop  , no rub, nl s1 s2                           - JVD- none , edema-none, stasis changes- none, varices- none           Lung-  Distant+, wheeze -none, unlabored, cough-none, dullness-none, rub- none           Chest wall- +Back brace Abd-  Br/ Gen/ Rectal- Not done, not indicated Extrem- cyanosis- none, clubbing, none, atrophy- none, strength- nl. +RLE wrapped Neuro- grossly intact to observation

## 2019-11-27 ENCOUNTER — Encounter (HOSPITAL_COMMUNITY)
Admission: RE | Admit: 2019-11-27 | Discharge: 2019-11-27 | Disposition: A | Discharge status: Home / Self Care | Payer: Medicare Other | Source: Ambulatory Visit | Attending: Internal Medicine | Admitting: Internal Medicine

## 2019-11-27 ENCOUNTER — Other Ambulatory Visit: Payer: Self-pay

## 2019-11-27 DIAGNOSIS — J9611 Chronic respiratory failure with hypoxia: Secondary | ICD-10-CM | POA: Diagnosis not present

## 2019-11-27 NOTE — Progress Notes (Signed)
[name] 85 y.o. male Nutrition Note  Visit Diagnosis: Chronic respiratory failure with hypoxia [facility])  Past Medical History:  Diagnosis Date  . Allergic rhinitis, cause unspecified   . Arthritis   . COPD (chronic obstructive pulmonary disease) ([place])   . Dyspnea   . Dysrhythmia    "1 episode of tachycardia in 1980's, been on it ever since"  . Emphysema of lung ([place])   . Hypertension   . Left inguinal hernia 02/07/2019  . Low hemoglobin    low level  . Neuromuscular disorder ([place])   . Neuropathy    feet  . Pneumonia    History of, last Oct 2019  . Pulmonary nodule      Medications reviewed.   Current Outpatient Medications:  .  acetaminophen (TYLENOL) 500 MG tablet, Take 500 mg by mouth See admin instructions. 2 - 4 times a day, Disp: , Rfl:  .  aspirin EC 81 MG tablet, Take 1 tablet (81 mg total) by mouth daily., Disp: , Rfl:  .  Budeson-Glycopyrrol-Formoterol (BREZTRI AEROSPHERE) 160-9-4.8 MCG/ACT AERO, Inhale 2 puffs into the lungs in the morning and at bedtime., Disp: 31.1 g, Rfl: 0 .  losartan (COZAAR) 25 MG tablet, TAKE 1 TABLET(25 MG) BY MOUTH DAILY, Disp: 90 tablet, Rfl: 2 .  Nutritional Supplements (ENSURE HIGH PROTEIN) LIQD, Take 1 Bottle by mouth daily., Disp: , Rfl:  .  OXYGEN, Inhale 3-6 L into the lungs continuous. , Disp: , Rfl:  .  polyethylene glycol powder (GLYCOLAX/MIRALAX) powder, Take 17 g by mouth daily., Disp: , Rfl:  .  pravastatin (PRAVACHOL) 40 MG tablet, Take 1 tablet (40 mg total) by mouth daily., Disp: 90 tablet, Rfl: 1 .  SYMBICORT 160-4.5 MCG/ACT inhaler, INHALE 2 PUFFS BY MOUTH INTO THE LUNGS TWICE DAILY. RINSE MOUTH, Disp: 10.2 g, Rfl: 5 .  tamsulosin (FLOMAX) 0.4 MG CAPS capsule, Take 1 capsule (0.4 mg total) by mouth daily., Disp: 90 capsule, Rfl: 1 .  Tiotropium Bromide Monohydrate (SPIRIVA RESPIMAT) 2.5 MCG/ACT AERS, Inhale 2 puffs into the lungs daily., Disp: 12 g, Rfl: 3 .  vitamin B-12 (CYANOCOBALAMIN) 1000 MCG tablet, Take 1  tablet (1,000 mcg total) by mouth daily., Disp: 30 tablet, Rfl: 5   Ht Readings from Last 1 Encounters:  11/22/19 5' 7.5" (1.715 m)     Wt Readings from Last 3 Encounters:  11/22/19 142 lb (64.4 kg)  11/20/19 143 lb 1.3 oz (64.9 kg)  11/07/19 144 lb 2.9 oz (65.4 kg)     There is no height or weight on file to calculate BMI.   Social History   Tobacco Use  Smoking Status Former Smoker  . Packs/day: 2.00  . Years: 38.00  . Pack years: 76.00  . Types: Cigarettes  . Quit date: 08/16/1990  . Years since quitting: 29.3  Smokeless Tobacco Never Used     Nutrition Note  Spoke with pt. Nutrition Plan and Nutrition Survey goals reviewed with pt.   Pt reports losing 15 lbs after he fell July 2020. He says his weight has stabilized. He weighs himself at least weekly. He was drinking Ensure and has since switched to [employer]. Discussed difference in calories between these two shakes. Encouraged pt to switch to Ensure or add high calorie food to his diet if he notices worsened appetite. Recommended pt to continue weekly weight monitoring. Reviewed high cal food list such as peanut butter, nuts, some granola bars, and oils.  Pt expressed understanding of the information reviewed.  Nutrition Diagnosis ? Inadequate oral intake related to poor appetite as evidenced by diet recall and pt report  Nutrition Intervention ? Pt's individual nutrition plan reviewed with pt. ? Benefits of adopting healthy diet reviewed with Rate My Plate survey   ? Continue client-centered nutrition education by RD, as part of interdisciplinary care.  Goal(s) ? Pt to monitor appetite and weight ? Pt to continue drinking protein shake daily ? Pt to increase protein shake BID if appetite decreases  Plan:   Will provide client-centered nutrition education as part of interdisciplinary care  Monitor and evaluate progress toward nutrition goal with team.   Michaele Offer, MS, RDN, LDN

## 2019-11-27 NOTE — Progress Notes (Signed)
Daily Session Note  Patient Details  Name: [name] MRN: [id number] Date of Birth: 1934/10/07 Referring Provider:     Pulmonary Rehab Walk Test from 11/07/2019 in [place]  Referring Provider  Dr. Annamaria Boots      Encounter Date: 11/27/2019  Check In: Session Check In - 11/27/19 1013      Check-In   Supervising physician immediately available to respond to emergencies  [facility] immediately available    Physician(s)  Dr. Doristine Bosworth    Location  [facility]    Staff Present  Rosebud Poles, RN, Bjorn Loser, MS, Exercise Physiologist;Lisa Ysidro Evert, RN    Virtual Visit  No    Medication changes reported      No    Fall or balance concerns reported     No    Tobacco Cessation  No Change    Warm-up and Cool-down  Performed on first and last piece of equipment    Resistance Training Performed  Yes    VAD Patient?  No    PAD/SET Patient?  No      Pain Assessment   Currently in Pain?  No/denies    Multiple Pain Sites  No       Capillary Blood Glucose: No results found for this or any previous visit (from the past 24 hour(s)).    Social History   Tobacco Use  Smoking Status Former Smoker  . Packs/day: 2.00  . Years: 38.00  . Pack years: 76.00  . Types: Cigarettes  . Quit date: 08/16/1990  . Years since quitting: 29.3  Smokeless Tobacco Never Used    Goals Met:  Proper associated with RPD/PD & O2 Sat Exercise tolerated well Strength training completed today  Goals Unmet:  Not Applicable  Comments: Service time is from 1015 to 1120    Dr. Fransico Him is Medical Director for Cardiac Rehab at [facility].

## 2019-11-29 ENCOUNTER — Other Ambulatory Visit: Payer: Self-pay

## 2019-11-29 ENCOUNTER — Encounter (HOSPITAL_COMMUNITY)
Admission: RE | Admit: 2019-11-29 | Discharge: 2019-11-29 | Disposition: A | Discharge status: Home / Self Care | Payer: Medicare Other | Source: Ambulatory Visit | Attending: Internal Medicine | Admitting: Internal Medicine

## 2019-11-29 VITALS — Temp 97.7°F | Wt 146.2 lb

## 2019-11-29 DIAGNOSIS — J9611 Chronic respiratory failure with hypoxia: Secondary | ICD-10-CM

## 2019-11-29 NOTE — Progress Notes (Signed)
Daily Session Note  Patient Details  Name: [name] MRN: [id number] Date of Birth: [birth year] Referring Provider:     Pulmonary Rehab Walk Test from 11/07/2019 in [place]  Referring Provider  Dr. Annamaria Boots      Encounter Date: 11/29/2019  Check In: Session Check In - 11/29/19 1136      Check-In   Supervising physician immediately available to respond to emergencies  [facility] immediately available    Physician(s)  Dr. Erlinda Hong    Location  [facility]    Staff Present  Rosebud Poles, RN, Bjorn Loser, MS, Exercise Physiologist;Lisa Ysidro Evert, RN    Virtual Visit  No    Medication changes reported      No    Fall or balance concerns reported     No    Tobacco Cessation  No Change    Warm-up and Cool-down  Performed as group-led instruction    Resistance Training Performed  Yes    VAD Patient?  No    PAD/SET Patient?  No      Pain Assessment   Currently in Pain?  No/denies    Multiple Pain Sites  No       Capillary Blood Glucose: No results found for this or any previous visit (from the past 24 hour(s)).    Social History   Tobacco Use  Smoking Status Former Smoker  . Packs/day: 2.00  . Years: 38.00  . Pack years: 76.00  . Types: Cigarettes  . Quit date: 08/16/1990  . Years since quitting: 29.3  Smokeless Tobacco Never Used    Goals Met:  Independence with exercise equipment Exercise tolerated well Strength training completed today  Goals Unmet:  Not Applicable  Comments: Service time is from 1017 to 1118    Dr. Fransico Him is Medical Director for Cardiac Rehab at [facility].

## 2019-11-30 ENCOUNTER — Other Ambulatory Visit: Payer: Self-pay | Admitting: Internal Medicine

## 2019-12-04 ENCOUNTER — Encounter (HOSPITAL_COMMUNITY)
Admission: RE | Admit: 2019-12-04 | Discharge: 2019-12-04 | Disposition: A | Discharge status: Home / Self Care | Payer: Medicare Other | Source: Ambulatory Visit | Attending: Internal Medicine | Admitting: Internal Medicine

## 2019-12-05 NOTE — Progress Notes (Signed)
Pulmonary Individual Treatment Plan  Patient Details  Name: [name] MRN: [id number] Date of Birth: Mar 13, 1935 Referring Provider:     Pulmonary Rehab Walk Test from 11/07/2019 in [place]  Referring Provider  Dr. Annamaria Boots      Initial Encounter Date:    Pulmonary Rehab Walk Test from 11/07/2019 in [place]  Date  11/07/19      Visit Diagnosis: Chronic respiratory failure with hypoxia ([place])  Patient's Home Medications on Admission:   Current Outpatient Medications:  .  acetaminophen (TYLENOL) 500 MG tablet, Take 500 mg by mouth See admin instructions. 2 - 4 times a day, Disp: , Rfl:  .  aspirin EC 81 MG tablet, Take 1 tablet (81 mg total) by mouth daily., Disp: , Rfl:  .  Budeson-Glycopyrrol-Formoterol (BREZTRI AEROSPHERE) 160-9-4.8 MCG/ACT AERO, Inhale 2 puffs into the lungs in the morning and at bedtime., Disp: 31.1 g, Rfl: 0 .  losartan (COZAAR) 25 MG tablet, TAKE 1 TABLET(25 MG) BY MOUTH DAILY, Disp: 90 tablet, Rfl: 2 .  Nutritional Supplements (ENSURE HIGH PROTEIN) LIQD, Take 1 Bottle by mouth daily., Disp: , Rfl:  .  OXYGEN, Inhale 3-6 L into the lungs continuous. , Disp: , Rfl:  .  polyethylene glycol powder (GLYCOLAX/MIRALAX) powder, Take 17 g by mouth daily., Disp: , Rfl:  .  pravastatin (PRAVACHOL) 40 MG tablet, Take 1 tablet (40 mg total) by mouth daily., Disp: 90 tablet, Rfl: 1 .  SYMBICORT 160-4.5 MCG/ACT inhaler, INHALE 2 PUFFS BY MOUTH INTO THE LUNGS TWICE DAILY. RINSE MOUTH, Disp: 10.2 g, Rfl: 5 .  tamsulosin (FLOMAX) 0.4 MG CAPS capsule, Take 1 capsule (0.4 mg total) by mouth daily., Disp: 90 capsule, Rfl: 1 .  Tiotropium Bromide Monohydrate (SPIRIVA RESPIMAT) 2.5 MCG/ACT AERS, Inhale 2 puffs into the lungs daily., Disp: 12 g, Rfl: 3 .  vitamin B-12 (CYANOCOBALAMIN) 1000 MCG tablet, Take 1 tablet (1,000 mcg total) by mouth daily., Disp: 30 tablet, Rfl: 5  Past Medical History: Past Medical  History:  Diagnosis Date  . Allergic rhinitis, cause unspecified   . Arthritis   . COPD (chronic obstructive pulmonary disease) ([place])   . Dyspnea   . Dysrhythmia    "1 episode of tachycardia in 1980's, been on it ever since"  . Emphysema of lung ([place])   . Hypertension   . Left inguinal hernia 02/07/2019  . Low hemoglobin    low level  . Neuromuscular disorder ([place])   . Neuropathy    feet  . Pneumonia    History of, last Oct 2019  . Pulmonary nodule     Tobacco Use: Social History   Tobacco Use  Smoking Status Former Smoker  . Packs/day: 2.00  . Years: 38.00  . Pack years: 76.00  . Types: Cigarettes  . Quit date: 08/16/1990  . Years since quitting: 29.3  Smokeless Tobacco Never Used    Labs: Recent Review Flowsheet Data    There is no flowsheet data to display.      Capillary Blood Glucose: Lab Results  Component Value Date   GLUCAP 106 (H) 02/07/2019   GLUCAP 107 (H) 12/21/2010     Pulmonary Assessment Scores: Pulmonary Assessment Scores    Row Name 11/07/19 1208 11/07/19 1211       ADL UCSD   ADL Phase  Entry  Entry    SOB Score total  --  90      CAT Score   CAT  Score  --  28      mMRC Score   mMRC Score  4  --      UCSD: Self-administered rating of dyspnea associated with activities of daily living (ADLs) 6-point scale (0 = "not at all" to 5 = "maximal or unable to do because of breathlessness")  Scoring Scores range from 0 to 120.  Minimally important difference is 5 units  CAT: CAT can identify the health impairment of COPD patients and is better correlated with disease progression.  CAT has a scoring range of zero to 40. The CAT score is classified into four groups of low (less than 10), medium (10 - 20), high (21-30) and very high (31-40) based on the impact level of disease on health status. A CAT score over 10 suggests significant symptoms.  A worsening CAT score could be explained by an exacerbation, poor medication adherence, poor  inhaler technique, or progression of COPD or comorbid conditions.  CAT MCID is 2 points  mMRC: mMRC (Modified Medical Research Council) Dyspnea Scale is used to assess the degree of baseline functional disability in patients of respiratory disease due to dyspnea. No minimal important difference is established. A decrease in score of 1 point or greater is considered a positive change.   Pulmonary Function Assessment: Pulmonary Function Assessment - 11/07/19 1211      Breath   Shortness of Breath  Yes;Limiting activity       Exercise Target Goals: Exercise Program Goal: Individual exercise prescription set using results from initial 6 min walk test and THRR while considering  patient's activity barriers and safety.   Exercise Prescription Goal: Initial exercise prescription builds to 30-45 minutes a day of aerobic activity, 2-3 days per week.  Home exercise guidelines will be given to patient during program as part of exercise prescription that the participant will acknowledge.  Activity Barriers & Risk Stratification: Activity Barriers & Cardiac Risk Stratification - 11/07/19 1105      Activity Barriers & Cardiac Risk Stratification   Activity Barriers  Back Problems   L1 compression fracture 1 year ago      6 Minute Walk: 6 Minute Walk    Row Name 11/07/19 1210         6 Minute Walk   Phase  Initial     Distance  634 feet     Walk Time  5.5 minutes     # of Rest Breaks  1     MPH  1.2     METS  1.69     RPE  18     Perceived Dyspnea   3     VO2 Peak  5.93     Symptoms  Yes (comment)     Comments  pushed wheelchair. 1 standing rest break for 30 sec due to general fatigue     Resting HR  105 bpm     Resting BP  124/60     Resting Oxygen Saturation   100 %     Exercise Oxygen Saturation  during 6 min walk  99 %     Max Ex. HR  109 bpm     Max Ex. BP  156/64     2 Minute Post BP  118/56       Interval HR   1 Minute HR  106     2 Minute HR  106     3 Minute HR   107     4 Minute HR  109  5 Minute HR  108     6 Minute HR  102     2 Minute Post HR  102     Interval Heart Rate?  Yes       Interval Oxygen   Interval Oxygen?  Yes     Baseline Oxygen Saturation %  100 %     1 Minute Oxygen Saturation %  100 %     1 Minute Liters of Oxygen  4 L     2 Minute Oxygen Saturation %  100 %     2 Minute Liters of Oxygen  4 L     3 Minute Oxygen Saturation %  100 %     3 Minute Liters of Oxygen  4 L     4 Minute Oxygen Saturation %  100 %     4 Minute Liters of Oxygen  4 L     5 Minute Oxygen Saturation %  100 %     5 Minute Liters of Oxygen  4 L     6 Minute Oxygen Saturation %  99 %     6 Minute Liters of Oxygen  4 L     2 Minute Post Oxygen Saturation %  100 %     2 Minute Post Liters of Oxygen  4 L        Oxygen Initial Assessment: Oxygen Initial Assessment - 11/07/19 1207      Home Oxygen   Home Oxygen Device  Liquid Oxygen    Sleep Oxygen Prescription  Continuous    Liters per minute  4    Home Exercise Oxygen Prescription  Continuous    Liters per minute  4    Home at Rest Exercise Oxygen Prescription  Continuous    Liters per minute  2    Compliance with Home Oxygen Use  Yes      Initial 6 min Walk   Oxygen Used  Continuous    Liters per minute  4      Program Oxygen Prescription   Program Oxygen Prescription  Continuous    Liters per minute  4      Intervention   Short Term Goals  To learn and exhibit compliance with exercise, home and travel O2 prescription;To learn and understand importance of monitoring SPO2 with pulse oximeter and demonstrate accurate use of the pulse oximeter.;To learn and understand importance of maintaining oxygen saturations>88%;To learn and demonstrate proper pursed lip breathing techniques or other breathing techniques.;To learn and demonstrate proper use of respiratory medications    Long  Term Goals  Exhibits compliance with exercise, home and travel O2 prescription;Verbalizes importance of  monitoring SPO2 with pulse oximeter and return demonstration;Maintenance of O2 saturations>88%;Exhibits proper breathing techniques, such as pursed lip breathing or other method taught during program session;Compliance with respiratory medication;Demonstrates proper use of MDI's       Oxygen Re-Evaluation: Oxygen Re-Evaluation    Row Name 12/04/19 0743             Program Oxygen Prescription   Program Oxygen Prescription  Continuous       Liters per minute  4       Comments  Pt refuses to let us wean his O2 even though sats are 100 on 4L during exercise.         Home Oxygen   Home Oxygen Device  Liquid Oxygen       Sleep Oxygen Prescription  Continuous  Liters per minute  4       Home Exercise Oxygen Prescription  Continuous       Liters per minute  4       Home at Rest Exercise Oxygen Prescription  Continuous       Liters per minute  2       Compliance with Home Oxygen Use  Yes         Goals/Expected Outcomes   Short Term Goals  To learn and exhibit compliance with exercise, home and travel O2 prescription;To learn and understand importance of monitoring SPO2 with pulse oximeter and demonstrate accurate use of the pulse oximeter.;To learn and demonstrate proper pursed lip breathing techniques or other breathing techniques.;To learn and demonstrate proper use of respiratory medications;To learn and understand importance of maintaining oxygen saturations>88%       Long  Term Goals  Exhibits compliance with exercise, home and travel O2 prescription;Verbalizes importance of monitoring SPO2 with pulse oximeter and return demonstration;Maintenance of O2 saturations>88%;Exhibits proper breathing techniques, such as pursed lip breathing or other method taught during program session;Compliance with respiratory medication;Demonstrates proper use of MDI's       Goals/Expected Outcomes  compliance          Oxygen Discharge (Final Oxygen Re-Evaluation): Oxygen Re-Evaluation - 12/04/19  0743      Program Oxygen Prescription   Program Oxygen Prescription  Continuous    Liters per minute  4    Comments  Pt refuses to let us wean his O2 even though sats are 100 on 4L during exercise.      Home Oxygen   Home Oxygen Device  Liquid Oxygen    Sleep Oxygen Prescription  Continuous    Liters per minute  4    Home Exercise Oxygen Prescription  Continuous    Liters per minute  4    Home at Rest Exercise Oxygen Prescription  Continuous    Liters per minute  2    Compliance with Home Oxygen Use  Yes      Goals/Expected Outcomes   Short Term Goals  To learn and exhibit compliance with exercise, home and travel O2 prescription;To learn and understand importance of monitoring SPO2 with pulse oximeter and demonstrate accurate use of the pulse oximeter.;To learn and demonstrate proper pursed lip breathing techniques or other breathing techniques.;To learn and demonstrate proper use of respiratory medications;To learn and understand importance of maintaining oxygen saturations>88%    Long  Term Goals  Exhibits compliance with exercise, home and travel O2 prescription;Verbalizes importance of monitoring SPO2 with pulse oximeter and return demonstration;Maintenance of O2 saturations>88%;Exhibits proper breathing techniques, such as pursed lip breathing or other method taught during program session;Compliance with respiratory medication;Demonstrates proper use of MDI's    Goals/Expected Outcomes  compliance       Initial Exercise Prescription: Initial Exercise Prescription - 11/07/19 1200      Date of Initial Exercise RX and Referring Provider   Date  11/07/19    Referring Provider  Dr. Annamaria Boots      Oxygen   Oxygen  Continuous    Liters  4      T5 Nustep   Level  2    SPM  80    Minutes  30      Prescription Details   Frequency (times per week)  2    Duration  Progress to 30 minutes of continuous aerobic without signs/symptoms of physical distress      Intensity   THRR  40-80%  of Max Heartrate  55-110    Ratings of Perceived Exertion  11-13    Perceived Dyspnea  0-4      Progression   Progression  Continue to progress workloads to maintain intensity without signs/symptoms of physical distress.      Resistance Training   Training Prescription  Yes    Weight  orange bands    Reps  10-15       Perform Capillary Blood Glucose checks as needed.  Exercise Prescription Changes: Exercise Prescription Changes    Row Name 11/20/19 1100 11/29/19 1158 12/04/19 1100         Response to Exercise   Blood Pressure (Admit)  144/70  136/66  144/70     Blood Pressure (Exercise)  134/70  --  134/70     Blood Pressure (Exit)  108/50  108/60  108/50     Heart Rate (Admit)  95 bpm  87 bpm  95 bpm     Heart Rate (Exercise)  93 bpm  92 bpm  93 bpm     Heart Rate (Exit)  100 bpm  91 bpm  --     Oxygen Saturation (Admit)  99 %  100 %  --     Oxygen Saturation (Exercise)  100 %  100 %  --     Oxygen Saturation (Exit)  100 %  100 %  --     Rating of Perceived Exertion (Exercise)  15  15  --     Perceived Dyspnea (Exercise)  3  3  --     Duration  Continue with 30 min of aerobic exercise without signs/symptoms of physical distress.  Continue with 30 min of aerobic exercise without signs/symptoms of physical distress.  --     Intensity  THRR unchanged  THRR unchanged  --       Progression   Progression  Continue to progress workloads to maintain intensity without signs/symptoms of physical distress.  Continue to progress workloads to maintain intensity without signs/symptoms of physical distress.  --       Horticulturist, commercial Prescription  Yes  Yes  --     Weight  orange bands  --  --     Reps  10-15  10-15  --     Time  10 Minutes  10 Minutes  --       Oxygen   Oxygen  Continuous  Continuous  --     Liters  4  4  --       T5 Nustep   Level  2  2  --     SPM  80  80  --     Minutes  30  30  --     METs  1.5  1.4  --        Exercise  Comments:   Exercise Goals and Review: Exercise Goals    Row Name 11/07/19 1219 12/04/19 0744           Exercise Goals   Increase Physical Activity  Yes  Yes      Intervention  Provide advice, education, support and counseling about physical activity/exercise needs.;Develop an individualized exercise prescription for aerobic and resistive training based on initial evaluation findings, risk stratification, comorbidities and participant's personal goals.  Provide advice, education, support and counseling about physical activity/exercise needs.;Develop an individualized exercise prescription for aerobic and resistive training based on initial evaluation findings, risk stratification, comorbidities and  participant's personal goals.      Expected Outcomes  Short Term: Attend rehab on a regular basis to increase amount of physical activity.;Long Term: Add in home exercise to make exercise part of routine and to increase amount of physical activity.;Long Term: Exercising regularly at least 3-5 days a week.  Short Term: Attend rehab on a regular basis to increase amount of physical activity.;Long Term: Add in home exercise to make exercise part of routine and to increase amount of physical activity.;Long Term: Exercising regularly at least 3-5 days a week.      Increase Strength and Stamina  Yes  Yes      Intervention  Provide advice, education, support and counseling about physical activity/exercise needs.;Develop an individualized exercise prescription for aerobic and resistive training based on initial evaluation findings, risk stratification, comorbidities and participant's personal goals.  Provide advice, education, support and counseling about physical activity/exercise needs.;Develop an individualized exercise prescription for aerobic and resistive training based on initial evaluation findings, risk stratification, comorbidities and participant's personal goals.      Expected Outcomes  Short Term:  Increase workloads from initial exercise prescription for resistance, speed, and METs.;Short Term: Perform resistance training exercises routinely during rehab and add in resistance training at home;Long Term: Improve cardiorespiratory fitness, muscular endurance and strength as measured by increased METs and functional capacity (6MWT)  Short Term: Increase workloads from initial exercise prescription for resistance, speed, and METs.;Short Term: Perform resistance training exercises routinely during rehab and add in resistance training at home;Long Term: Improve cardiorespiratory fitness, muscular endurance and strength as measured by increased METs and functional capacity (6MWT)      Able to understand and use rate of perceived exertion (RPE) scale  Yes  Yes      Intervention  Provide education and explanation on how to use RPE scale  Provide education and explanation on how to use RPE scale      Expected Outcomes  Short Term: Able to use RPE daily in rehab to express subjective intensity level;Long Term:  Able to use RPE to guide intensity level when exercising independently  Short Term: Able to use RPE daily in rehab to express subjective intensity level;Long Term:  Able to use RPE to guide intensity level when exercising independently      Able to understand and use Dyspnea scale  Yes  Yes      Intervention  Provide education and explanation on how to use Dyspnea scale  Provide education and explanation on how to use Dyspnea scale      Expected Outcomes  Short Term: Able to use Dyspnea scale daily in rehab to express subjective sense of shortness of breath during exertion;Long Term: Able to use Dyspnea scale to guide intensity level when exercising independently  Short Term: Able to use Dyspnea scale daily in rehab to express subjective sense of shortness of breath during exertion;Long Term: Able to use Dyspnea scale to guide intensity level when exercising independently      Knowledge and understanding  of Target Heart Rate Range (THRR)  Yes  Yes      Intervention  Provide education and explanation of THRR including how the numbers were predicted and where they are located for reference  Provide education and explanation of THRR including how the numbers were predicted and where they are located for reference      Expected Outcomes  Short Term: Able to state/look up THRR;Short Term: Able to use daily as guideline for intensity in rehab;Long Term: Able  to use THRR to govern intensity when exercising independently  Short Term: Able to state/look up THRR;Short Term: Able to use daily as guideline for intensity in rehab;Long Term: Able to use THRR to govern intensity when exercising independently      Understanding of Exercise Prescription  Yes  Yes      Intervention  Provide education, explanation, and written materials on patient's individual exercise prescription  Provide education, explanation, and written materials on patient's individual exercise prescription      Expected Outcomes  Short Term: Able to explain program exercise prescription;Long Term: Able to explain home exercise prescription to exercise independently  Short Term: Able to explain program exercise prescription;Long Term: Able to explain home exercise prescription to exercise independently         Exercise Goals Re-Evaluation : Exercise Goals Re-Evaluation    Row Name 12/04/19 0744             Exercise Goal Re-Evaluation   Exercise Goals Review  Increase Physical Activity;Increase Strength and Stamina;Able to understand and use rate of perceived exertion (RPE) scale;Able to understand and use Dyspnea scale;Knowledge and understanding of Target Heart Rate Range (THRR);Understanding of Exercise Prescription       Comments  Pt has completed 5 exercise sessions. Pt is severely deconditioned. He takes rest breaks every 3-4 minutes on the stepper and is currently exercising at 1.4 METs. I do not know if it will be possible to progress  him any further. Will continue to monitor and progress as able.       Expected Outcomes  Through exercise at rehab and at home, the patient will decrease shortness of breath with daily activities and feel confident in carrying out an exercise regime at home.          Discharge Exercise Prescription (Final Exercise Prescription Changes): Exercise Prescription Changes - 12/04/19 1100      Response to Exercise   Blood Pressure (Admit)  144/70    Blood Pressure (Exercise)  134/70    Blood Pressure (Exit)  108/50    Heart Rate (Admit)  95 bpm    Heart Rate (Exercise)  93 bpm       Nutrition:  Target Goals: Understanding of nutrition guidelines, daily intake of sodium 1500mg , cholesterol 200mg , calories 30% from fat and 7% or less from saturated fats, daily to have 5 or more servings of fruits and vegetables.  Biometrics: Pre Biometrics - 11/07/19 1105      Pre Biometrics   Grip Strength  28 kg        Nutrition Therapy Plan and Nutrition Goals: Nutrition Therapy & Goals - 11/27/19 1127      Nutrition Therapy   Diet  High cal/high protein      Personal Nutrition Goals   Nutrition Goal  Pt to monitor appetite and weight    Personal Goal #2  Pt to continue drinking protein shake daily    Personal Goal #3  Pt to increase protein shake BID if appetite decreases      Intervention Plan   Intervention  Prescribe, educate and counsel regarding individualized specific dietary modifications aiming towards targeted core components such as weight, hypertension, lipid management, diabetes, heart failure and other comorbidities.    Expected Outcomes  Short Term Goal: A plan has been developed with personal nutrition goals set during dietitian appointment.       Nutrition Assessments: Nutrition Assessments - 11/15/19 1134      Rate Your Plate Scores   Pre  Score  51       Nutrition Goals Re-Evaluation: Nutrition Goals Re-Evaluation    [place] Name 11/27/19 1128             Goals    Current Weight  142 lb (64.4 kg)       Nutrition Goal  Pt to monitor appetite and weight       Expected Outcome  Weight to be maintained around 142 lbs         Personal Goal #2 Re-Evaluation   Personal Goal #2  Pt to continue drinking protein shake daily         Personal Goal #3 Re-Evaluation   Personal Goal #3  Pt to increase protein shake BID if appetite decreases          Nutrition Goals Discharge (Final Nutrition Goals Re-Evaluation): Nutrition Goals Re-Evaluation - 11/27/19 1128      Goals   Current Weight  142 lb (64.4 kg)    Nutrition Goal  Pt to monitor appetite and weight    Expected Outcome  Weight to be maintained around 142 lbs      Personal Goal #2 Re-Evaluation   Personal Goal #2  Pt to continue drinking protein shake daily      Personal Goal #3 Re-Evaluation   Personal Goal #3  Pt to increase protein shake BID if appetite decreases       Psychosocial: Target Goals: Acknowledge presence or absence of significant depression and/or stress, maximize coping skills, provide positive support system. Participant is able to verbalize types and ability to use techniques and skills needed for reducing stress and depression.  Initial Review & Psychosocial Screening: Initial Psych Review & Screening - 11/07/19 1213      Initial Review   Current issues with  None Identified      Family Dynamics   Good Support System?  Yes      Barriers   Psychosocial barriers to participate in program  There are no identifiable barriers or psychosocial needs.      Screening Interventions   Interventions  Encouraged to exercise       Quality of Life Scores:  Scores of 19 and below usually indicate a poorer quality of life in these areas.  A difference of  2-3 points is a clinically meaningful difference.  A difference of 2-3 points in the total score of the Quality of Life Index has been associated with significant improvement in overall quality of life, self-image, physical  symptoms, and general health in studies assessing change in quality of life.  PHQ-9: Recent Review Flowsheet Data    Depression screen [facility] 2/9 11/07/2019 10/18/2019 09/20/2019   Decreased Interest 0 0 0   Down, Depressed, Hopeless 0 0 0   PHQ - 2 Score 0 0 0   Altered sleeping 0 - -   Tired, decreased energy 1 - -   Change in appetite 1 - -   Feeling bad or failure about yourself  0 - -   Trouble concentrating 0 - -   Moving slowly or fidgety/restless 0 - -   Suicidal thoughts 0 - -   PHQ-9 Score 2 - -   Difficult doing work/chores Not difficult at all - -     Interpretation of Total Score  Total Score Depression Severity:  1-4 = Minimal depression, 5-9 = Mild depression, 10-14 = Moderate depression, 15-19 = Moderately severe depression, 20-27 = Severe depression   Psychosocial Evaluation and Intervention: Psychosocial Evaluation -  11/07/19 1213      Psychosocial Evaluation & Interventions   Interventions  Encouraged to exercise with the program and follow exercise prescription    Comments  No psychosocial concerns identified at this time    Continue Psychosocial Services   No Follow up required       Psychosocial Re-Evaluation: Psychosocial Re-Evaluation    [place] Name 12/03/19 1310             Psychosocial Re-Evaluation   Current issues with  None Identified       Comments  No barriers or psychsocial conerns identified at this time.       Expected Outcomes  For [name] to be free of barriers or psychosocial concerns while participating in pulmonary rehab.       Interventions  Encouraged to attend Pulmonary Rehabilitation for the exercise       Continue Psychosocial Services   No Follow up required          Psychosocial Discharge (Final Psychosocial Re-Evaluation): Psychosocial Re-Evaluation - 12/03/19 1310      Psychosocial Re-Evaluation   Current issues with  None Identified    Comments  No barriers or psychsocial conerns identified at this time.    Expected Outcomes   For [name] to be free of barriers or psychosocial concerns while participating in pulmonary rehab.    Interventions  Encouraged to attend Pulmonary Rehabilitation for the exercise    Continue Psychosocial Services   No Follow up required       Education: Education Goals: Education classes will be provided on a weekly basis, covering required topics. Participant will state understanding/return demonstration of topics presented.  Learning Barriers/Preferences:   Education Topics: Risk Factor Reduction:  -Group instruction that is supported by a PowerPoint presentation. Instructor discusses the definition of a risk factor, different risk factors for pulmonary disease, and how the heart and lungs work together.     Nutrition for Pulmonary Patient:  -Group instruction provided by PowerPoint slides, verbal discussion, and written materials to support subject matter. The instructor gives an explanation and review of healthy diet recommendations, which includes a discussion on weight management, recommendations for fruit and vegetable consumption, as well as protein, fluid, caffeine, fiber, sodium, sugar, and alcohol. Tips for eating when patients are short of breath are discussed.   PULMONARY REHAB OTHER RESPIRATORY from 11/29/2019 in [place]  Date  11/20/19  Educator  MD  Instruction Review Code  2- Demonstrated Understanding      Pursed Lip Breathing:  -Group instruction that is supported by demonstration and informational handouts. Instructor discusses the benefits of pursed lip and diaphragmatic breathing and detailed demonstration on how to preform both.     Oxygen Safety:  -Group instruction provided by PowerPoint, verbal discussion, and written material to support subject matter. There is an overview of "What is Oxygen" and "Why do we need it".  Instructor also reviews how to create a safe environment for oxygen use, the importance of using oxygen as  prescribed, and the risks of noncompliance. There is a brief discussion on traveling with oxygen and resources the patient may utilize.   Oxygen Equipment:  -Group instruction provided by [facility] Staff utilizing handouts, written materials, and equipment demonstrations.   Signs and Symptoms:  -Group instruction provided by written material and verbal discussion to support subject matter. Warning signs and symptoms of infection, stroke, and heart attack are reviewed and when to call the physician/911 reinforced. Tips for preventing  the spread of infection discussed.   Advanced Directives:  -Group instruction provided by verbal instruction and written material to support subject matter. Instructor reviews Advanced Directive laws and proper instruction for filling out document.   Pulmonary Video:  -Group video education that reviews the importance of medication and oxygen compliance, exercise, good nutrition, pulmonary hygiene, and pursed lip and diaphragmatic breathing for the pulmonary patient.   Exercise for the Pulmonary Patient:  -Group instruction that is supported by a PowerPoint presentation. Instructor discusses benefits of exercise, core components of exercise, frequency, duration, and intensity of an exercise routine, importance of utilizing pulse oximetry during exercise, safety while exercising, and options of places to exercise outside of rehab.     Pulmonary Medications:  -Verbally interactive group education provided by instructor with focus on inhaled medications and proper administration.   Anatomy and Physiology of the Respiratory System and Intimacy:  -Group instruction provided by PowerPoint, verbal discussion, and written material to support subject matter. Instructor reviews respiratory cycle and anatomical components of the respiratory system and their functions. Instructor also reviews differences in obstructive and restrictive respiratory diseases with examples  of each. Intimacy, Sex, and Sexuality differences are reviewed with a discussion on how relationships can change when diagnosed with pulmonary disease. Common sexual concerns are reviewed.   MD DAY -A group question and answer session with a medical doctor that allows participants to ask questions that relate to their pulmonary disease state.   OTHER EDUCATION -Group or individual verbal, written, or video instructions that support the educational goals of the pulmonary rehab program.   PULMONARY REHAB OTHER RESPIRATORY from 11/29/2019 in [place]  Date  11/29/19 [METs]  Educator  DF  Instruction Review Code  2- Demonstrated Understanding      Holiday Eating Survival Tips:  -Group instruction provided by PowerPoint slides, verbal discussion, and written materials to support subject matter. The instructor gives patients tips, tricks, and techniques to help them not only survive but enjoy the holidays despite the onslaught of food that accompanies the holidays.   Knowledge Questionnaire Score: Knowledge Questionnaire Score - 11/07/19 1214      Knowledge Questionnaire Score   Pre Score  16/18       Core Components/Risk Factors/Patient Goals at Admission: Personal Goals and Risk Factors at Admission - 11/07/19 1217      Core Components/Risk Factors/Patient Goals on Admission   Improve shortness of breath with ADL's  Yes    Intervention  Provide education, individualized exercise plan and daily activity instruction to help decrease symptoms of SOB with activities of daily living.    Expected Outcomes  Short Term: Improve cardiorespiratory fitness to achieve a reduction of symptoms when performing ADLs;Long Term: Be able to perform more ADLs without symptoms or delay the onset of symptoms       Core Components/Risk Factors/Patient Goals Review:  Goals and Risk Factor Review    Row Name 11/07/19 1218 12/03/19 1311           Core Components/Risk  Factors/Patient Goals Review   Personal Goals Review  Develop more efficient breathing techniques such as purse lipped breathing and diaphragmatic breathing and practicing self-pacing with activity.;Increase knowledge of respiratory medications and ability to use respiratory devices properly.;Improve shortness of breath with ADL's  Develop more efficient breathing techniques such as purse lipped breathing and diaphragmatic breathing and practicing self-pacing with activity.;Increase knowledge of respiratory medications and ability to use respiratory devices properly.;Improve shortness of breath with ADL's  Review  --  [name] is very deconditioned, he is exercising on the nustep @ level 2 for 30 minutes and takes multiple rest breaks, encouraged to take less breaks and push himself more.  At this point, he is slow to progress.      Expected Outcomes  --  See admission goals.         Core Components/Risk Factors/Patient Goals at Discharge (Final Review):  Goals and Risk Factor Review - 12/03/19 1311      Core Components/Risk Factors/Patient Goals Review   Personal Goals Review  Develop more efficient breathing techniques such as purse lipped breathing and diaphragmatic breathing and practicing self-pacing with activity.;Increase knowledge of respiratory medications and ability to use respiratory devices properly.;Improve shortness of breath with ADL's    Review  [name] is very deconditioned, he is exercising on the nustep @ level 2 for 30 minutes and takes multiple rest breaks, encouraged to take less breaks and push himself more.  At this point, he is slow to progress.    Expected Outcomes  See admission goals.       ITP Comments:   Comments: ITP REVIEW Pt is making expected progress toward pulmonary rehab goals after completing 5 sessions. Recommend continued exercise, life style modification, education, and utilization of breathing techniques to increase stamina and strength and decrease  shortness of breath with exertion.

## 2019-12-06 ENCOUNTER — Other Ambulatory Visit: Payer: Self-pay

## 2019-12-06 ENCOUNTER — Encounter (HOSPITAL_COMMUNITY)
Admission: RE | Admit: 2019-12-06 | Discharge: 2019-12-06 | Disposition: A | Discharge status: Home / Self Care | Payer: Medicare Other | Source: Ambulatory Visit | Attending: Internal Medicine | Admitting: Internal Medicine

## 2019-12-06 DIAGNOSIS — J9611 Chronic respiratory failure with hypoxia: Secondary | ICD-10-CM | POA: Diagnosis not present

## 2019-12-06 NOTE — Progress Notes (Signed)
Daily Session Note  Patient Details  Name: [name] MRN: [id number] Date of Birth: 11-17-1934 Referring Provider:     Pulmonary Rehab Walk Test from 11/07/2019 in [place]  Referring Provider  Dr. Annamaria Boots      Encounter Date: 12/06/2019  Check In: Session Check In - 12/06/19 1053      Check-In   Supervising physician immediately available to respond to emergencies  [facility] immediately available    Physician(s)  Dr. Dessa Phi    Location  [facility]    Staff Present  Rosebud Poles, RN, Bjorn Loser, MS, Exercise Physiologist;Lisa Ysidro Evert, RN    Virtual Visit  No    Medication changes reported      No    Fall or balance concerns reported     No    Tobacco Cessation  No Change    Warm-up and Cool-down  Performed as group-led instruction    Resistance Training Performed  Yes    VAD Patient?  No    PAD/SET Patient?  No      Pain Assessment   Currently in Pain?  No/denies    Multiple Pain Sites  No       Capillary Blood Glucose: No results found for this or any previous visit (from the past 24 hour(s)).    Social History   Tobacco Use  Smoking Status Former Smoker  . Packs/day: 2.00  . Years: 38.00  . Pack years: 76.00  . Types: Cigarettes  . Quit date: 08/16/1990  . Years since quitting: 29.3  Smokeless Tobacco Never Used    Goals Met:  Proper associated with RPD/PD & O2 Sat Exercise tolerated well Strength training completed today  Goals Unmet:  Not Applicable  Comments: Service time is from 1015 to 1120    Dr. Fransico Him is Medical Director for Cardiac Rehab at [facility].

## 2019-12-07 ENCOUNTER — Other Ambulatory Visit: Payer: Self-pay | Admitting: Internal Medicine

## 2019-12-07 MED ORDER — BUDESONIDE-FORMOTEROL FUMARATE 160-4.5 MCG/ACT IN AERO: INHALATION_SPRAY | RESPIRATORY_TRACT | 12 refills | Status: DC

## 2019-12-07 NOTE — Telephone Encounter (Signed)
Dr. Annamaria Boots please advise on mychart message from patient and if your ok with [place] sending in prescription:   I have tried [name] and it seems to be working well.  Please start the process to see if my Part [facility] will pay for it since it is not currently on its formulary.  Prescription should go to [facility] [address].  Thank you. [name] [address] [place], [place] [address] [phone] [email or web address]

## 2019-12-10 MED ORDER — BREZTRI AEROSPHERE 160-9-4.8 MCG/ACT IN AERO: 2.0000 | INHALATION_SPRAY | Freq: Two times a day (BID) | RESPIRATORY_TRACT | 12 refills | Status: DC

## 2019-12-10 NOTE — Addendum Note (Signed)
Addended by: Desmond Dike C on: 12/10/2019 11:18 AM   Modules accepted: Orders

## 2019-12-10 NOTE — Telephone Encounter (Signed)
Ok to order [employer] inhaler, # 1,  Inhale 2 puffs, then rinse mouth, twice daily, ref prn  When the drug store notifies [place] that PA is required, we can start that, since other products like Trelegy have not worked as well.

## 2019-12-11 ENCOUNTER — Other Ambulatory Visit: Payer: Self-pay

## 2019-12-11 ENCOUNTER — Encounter (HOSPITAL_COMMUNITY)
Admission: RE | Admit: 2019-12-11 | Discharge: 2019-12-11 | Disposition: A | Discharge status: Home / Self Care | Payer: Medicare Other | Source: Ambulatory Visit | Attending: Internal Medicine | Admitting: Internal Medicine

## 2019-12-11 DIAGNOSIS — J9611 Chronic respiratory failure with hypoxia: Secondary | ICD-10-CM

## 2019-12-11 NOTE — Progress Notes (Signed)
Daily Session Note  Patient Details  Name: [name] MRN: [id number] Date of Birth: 1935-01-05 Referring Provider:     Pulmonary Rehab Walk Test from 11/07/2019 in [place]  Referring Provider  Dr. Annamaria Boots      Encounter Date: 12/11/2019  Check In: Session Check In - 12/11/19 1150      Check-In   Supervising physician immediately available to respond to emergencies  [facility] immediately available    Physician(s)  Dr. Maylene Roes    Location  [facility]    Staff Present  Rosebud Poles, RN, Bjorn Loser, MS, Exercise Physiologist;Doni Widmer Ysidro Evert, RN    Virtual Visit  No    Medication changes reported      No    Fall or balance concerns reported     No    Tobacco Cessation  No Change    Warm-up and Cool-down  Performed on first and last piece of equipment    Resistance Training Performed  Yes    VAD Patient?  No    PAD/SET Patient?  No      Pain Assessment   Currently in Pain?  No/denies    Multiple Pain Sites  No       Capillary Blood Glucose: No results found for this or any previous visit (from the past 24 hour(s)).    Social History   Tobacco Use  Smoking Status Former Smoker  . Packs/day: 2.00  . Years: 38.00  . Pack years: 76.00  . Types: Cigarettes  . Quit date: 08/16/1990  . Years since quitting: 29.3  Smokeless Tobacco Never Used    Goals Met:  Exercise tolerated well No report of cardiac concerns or symptoms Strength training completed today  Goals Unmet:  Not Applicable  Comments: Service time is from 1018 to 1122    Dr. Fransico Him is Medical Director for Cardiac Rehab at [facility].

## 2019-12-11 NOTE — Assessment & Plan Note (Signed)
O2 2L will usually be sufficient. Explained issue of cold hands giving low sats.  Plan- O2 2l at rest and sleep. Ok up to 4L w exertion.

## 2019-12-11 NOTE — Assessment & Plan Note (Signed)
Gold IV severe COPD. Plan- samples Breztri for retrial. Script and PA if appropriate. CXR

## 2019-12-13 ENCOUNTER — Encounter (HOSPITAL_COMMUNITY): Payer: Medicare Other

## 2019-12-13 ENCOUNTER — Telehealth (HOSPITAL_COMMUNITY): Payer: Self-pay | Admitting: Internal Medicine

## 2019-12-16 IMAGING — DX DG CHEST 2V
2 series · 2 of 2 positions shown · non-contrast
Comparison: 05/18/2018

CLINICAL DATA: Follow-up pneumonia, shortness of breath

EXAM:
CHEST - 2 VIEW

[chest pa]
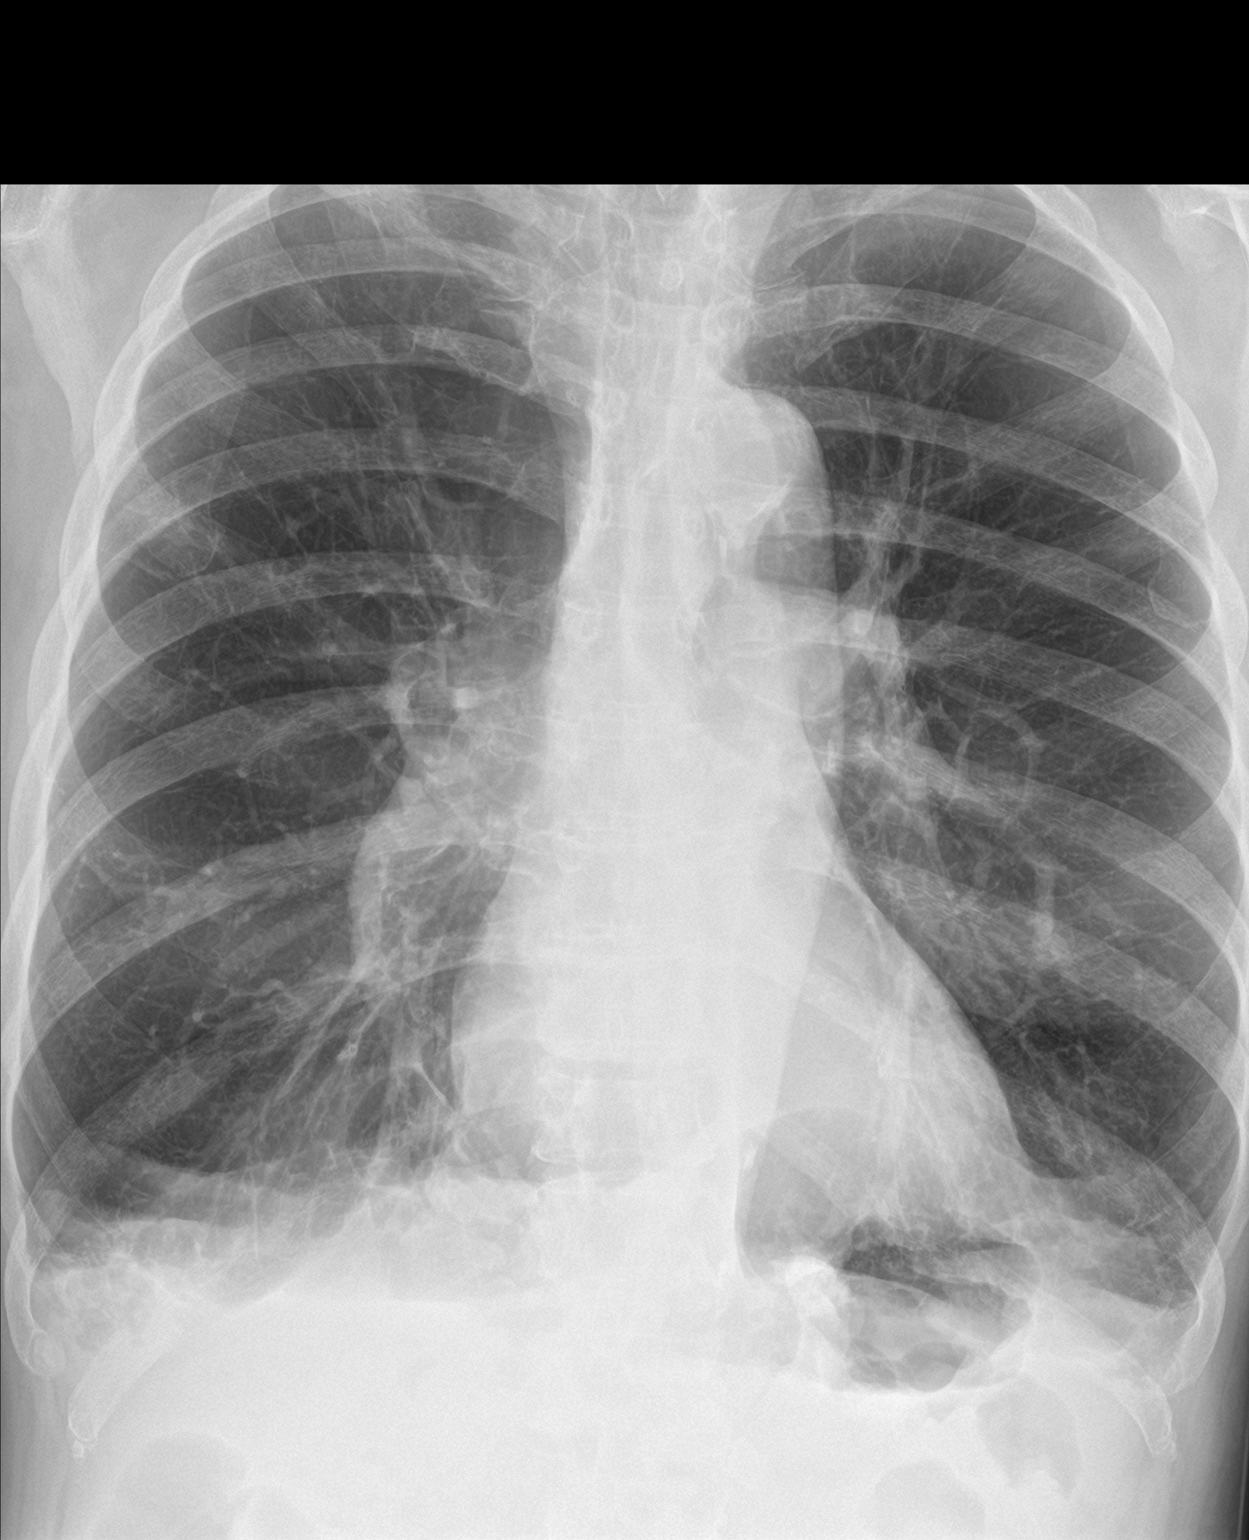

[chest lat]
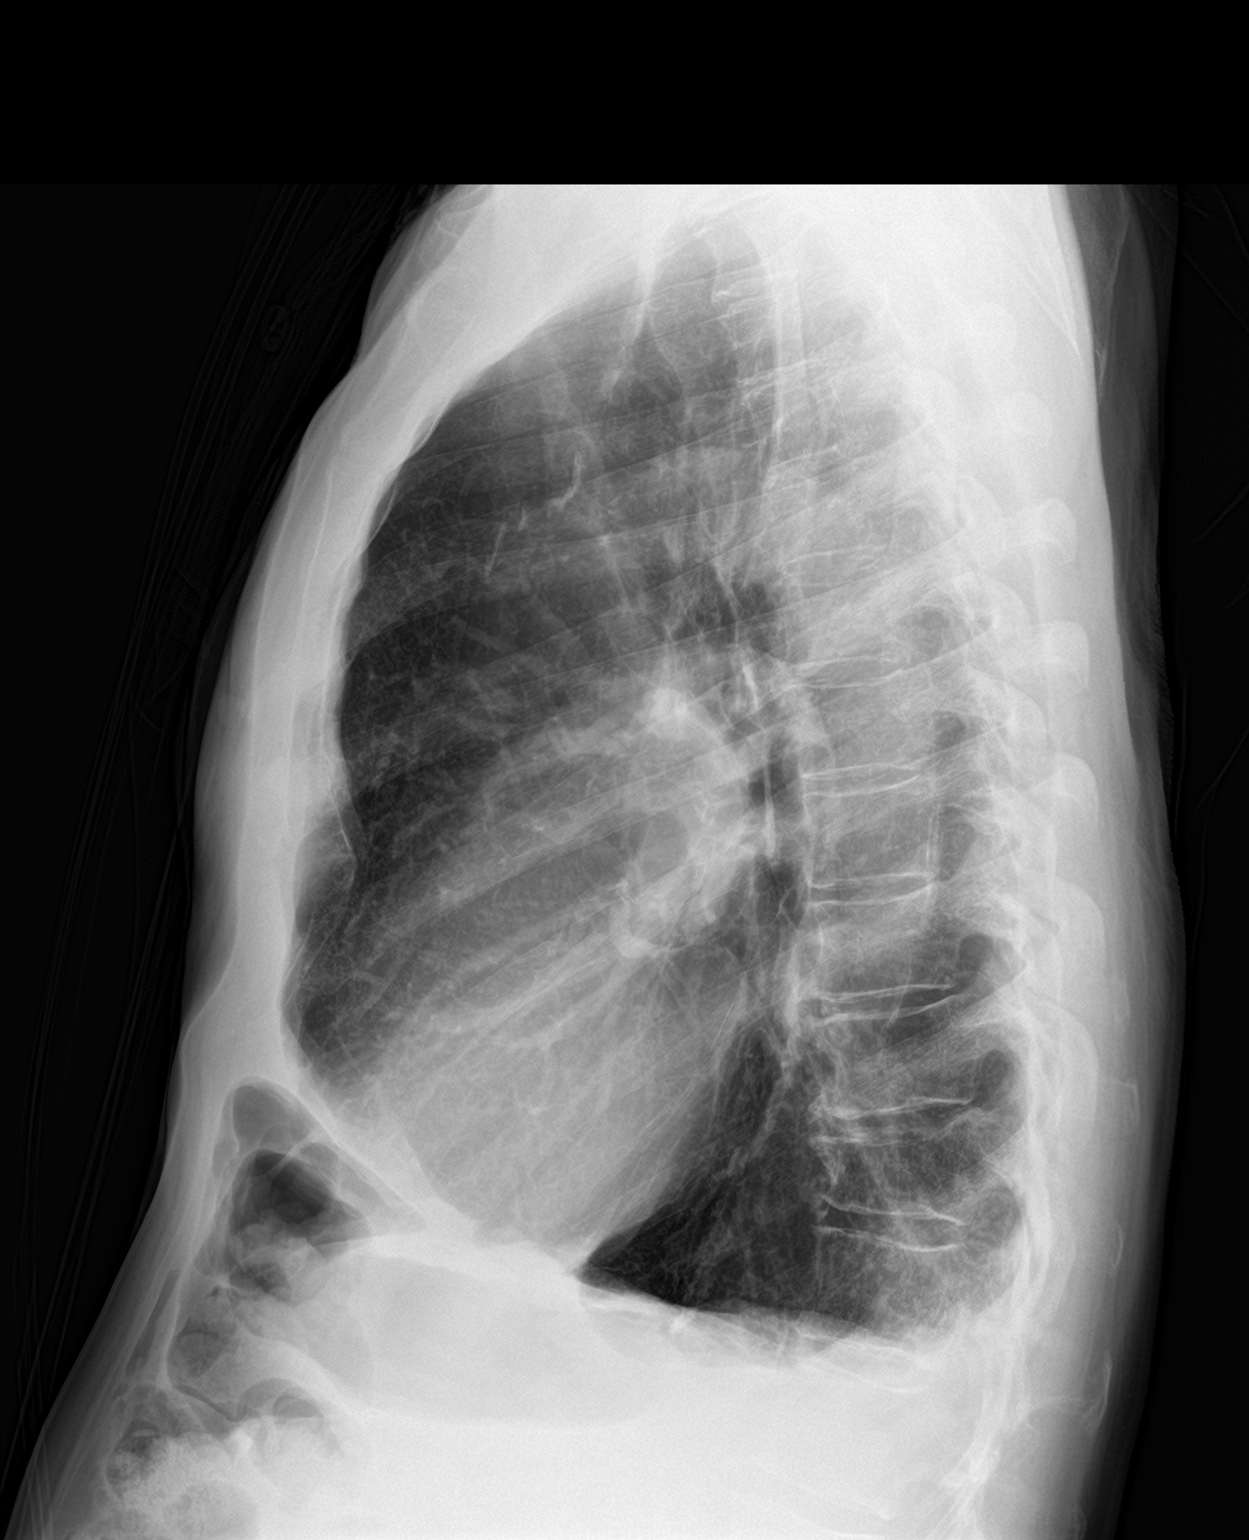

[2 of 2 positions shown; findings below may reference images not displayed]

FINDINGS: Cardiac shadow is stable. Aortic calcifications are again seen. The
lungs are hyperinflated consistent with COPD. Persistent density in
the left posterior costophrenic angle is noted stable from the
previous exam. No new focal infiltrate is seen. No bony abnormality
is noted. Stable compression deformities in the lower thoracic spine
are noted.
IMPRESSION: Stable density in the left posterior costophrenic angle. Again
continued follow-up following appropriate therapy is recommended.

## 2019-12-17 ENCOUNTER — Encounter: Payer: Self-pay | Admitting: Internal Medicine

## 2019-12-17 ENCOUNTER — Telehealth: Payer: Self-pay | Admitting: Internal Medicine

## 2019-12-17 NOTE — Telephone Encounter (Signed)
Medication name and strength: Breztri Provider: Adonis Housekeeper Pharmacy: [facility] on [address] Patient insurance ID: [id number] Phone: [phone] Fax:   Was the PA started on CMM?  Yes If yes, please enter the Key: [id number] Timeframe for approval/denial: Up to 72 hrs from today

## 2019-12-18 ENCOUNTER — Other Ambulatory Visit: Payer: Self-pay

## 2019-12-18 ENCOUNTER — Encounter (HOSPITAL_COMMUNITY)
Admission: RE | Admit: 2019-12-18 | Discharge: 2019-12-18 | Disposition: A | Discharge status: Home / Self Care | Payer: Medicare Other | Source: Ambulatory Visit | Attending: Internal Medicine | Admitting: Internal Medicine

## 2019-12-18 VITALS — Wt 142.6 lb

## 2019-12-18 DIAGNOSIS — J9611 Chronic respiratory failure with hypoxia: Secondary | ICD-10-CM | POA: Insufficient documentation

## 2019-12-18 NOTE — Progress Notes (Signed)
Daily Session Note  Patient Details  Name: [name] MRN: [id number] Date of Birth: 1934/12/25 Referring Provider:     Pulmonary Rehab Walk Test from 11/07/2019 in [place]  Referring Provider  Dr. Annamaria Boots      Encounter Date: 12/18/2019  Check In: Session Check In - 12/18/19 1104      Check-In   Supervising physician immediately available to respond to emergencies  [facility] immediately available    Physician(s)  Dr. Philis Pique    Location  [facility]    Staff Present  Rosebud Poles, RN, Bjorn Loser, MS, Exercise Physiologist;Delbra Zellars Ysidro Evert, RN    Virtual Visit  No    Medication changes reported      No    Fall or balance concerns reported     No    Tobacco Cessation  No Change    Warm-up and Cool-down  Performed as group-led instruction    Resistance Training Performed  Yes    VAD Patient?  No    PAD/SET Patient?  No      Pain Assessment   Currently in Pain?  No/denies    Multiple Pain Sites  No       Capillary Blood Glucose: No results found for this or any previous visit (from the past 24 hour(s)).  Exercise Prescription Changes - 12/18/19 1100      Response to Exercise   Blood Pressure (Admit)  110/62    Blood Pressure (Exercise)  130/62    Blood Pressure (Exit)  112/70    Heart Rate (Admit)  95 bpm    Heart Rate (Exercise)  100 bpm    Heart Rate (Exit)  97 bpm    Oxygen Saturation (Admit)  100 %    Oxygen Saturation (Exercise)  100 %    Oxygen Saturation (Exit)  100 %    Rating of Perceived Exertion (Exercise)  15    Perceived Dyspnea (Exercise)  3    Duration  Continue with 30 min of aerobic exercise without signs/symptoms of physical distress.    Intensity  THRR unchanged      Progression   Progression  Continue to progress workloads to maintain intensity without signs/symptoms of physical distress.      Resistance Training   Training Prescription  Yes    Weight  orange bands    Reps   10-15    Time  10 Minutes      Oxygen   Oxygen  Continuous    Liters  4      Arm/Foot Ergometer   Level  1    Minutes  15      T5 Nustep   Level  2    SPM  60    Minutes  15    METs  1.5       Social History   Tobacco Use  Smoking Status Former Smoker  . Packs/day: 2.00  . Years: 38.00  . Pack years: 76.00  . Types: Cigarettes  . Quit date: 08/16/1990  . Years since quitting: 29.3  Smokeless Tobacco Never Used    Goals Met:  No report of cardiac concerns or symptoms Strength training completed today  Goals Unmet:  Not Applicable  Comments: Service time is from 1015 to 1117    Dr. Fransico Him is Medical Director for Cardiac Rehab at [facility].

## 2019-12-20 ENCOUNTER — Other Ambulatory Visit: Payer: Self-pay

## 2019-12-20 ENCOUNTER — Other Ambulatory Visit: Payer: Self-pay | Admitting: Internal Medicine

## 2019-12-20 ENCOUNTER — Telehealth: Payer: Self-pay

## 2019-12-20 ENCOUNTER — Encounter (HOSPITAL_COMMUNITY)
Admission: RE | Admit: 2019-12-20 | Discharge: 2019-12-20 | Disposition: A | Discharge status: Home / Self Care | Payer: Medicare Other | Source: Ambulatory Visit | Attending: Internal Medicine | Admitting: Internal Medicine

## 2019-12-20 DIAGNOSIS — S32019S Unspecified fracture of first lumbar vertebra, sequela: Secondary | ICD-10-CM

## 2019-12-20 DIAGNOSIS — G603 Idiopathic progressive neuropathy: Secondary | ICD-10-CM

## 2019-12-20 DIAGNOSIS — J9611 Chronic respiratory failure with hypoxia: Secondary | ICD-10-CM | POA: Diagnosis not present

## 2019-12-20 NOTE — Telephone Encounter (Signed)
[name] would like to chat with you about his pulmonary PT, he feels that it is hurting his back and wearing him out, as well as, overwhelming him. He wants to discuss if moving him to regular pt would be a better choice. Please advise

## 2019-12-20 NOTE — Telephone Encounter (Signed)
Received response from [email or web address] that the PA was approved.   Pharmacy is aware of the approval.   Nothing further needed at time of call.

## 2019-12-20 NOTE — Progress Notes (Signed)
Daily Session Note  Patient Details  Name: [name] MRN: [id number] Date of Birth: 10-20-1934 Referring Provider:     Pulmonary Rehab Walk Test from 11/07/2019 in [place]  Referring Provider  Dr. Annamaria Boots      Encounter Date: 12/20/2019  Check In: Session Check In - 12/20/19 1013      Check-In   Supervising physician immediately available to respond to emergencies  [facility] immediately available    Physician(s)  Dr. Posey Pronto    Location  [facility]    Staff Present  Rosebud Poles, RN, Bjorn Loser, MS, Exercise Physiologist;Kayde Warehime Ysidro Evert, RN    Virtual Visit  No    Medication changes reported      No    Fall or balance concerns reported     No    Tobacco Cessation  No Change    Warm-up and Cool-down  Performed as group-led instruction    Resistance Training Performed  Yes    VAD Patient?  No    PAD/SET Patient?  No      Pain Assessment   Currently in Pain?  No/denies    Multiple Pain Sites  No       Capillary Blood Glucose: No results found for this or any previous visit (from the past 24 hour(s)).    Social History   Tobacco Use  Smoking Status Former Smoker  . Packs/day: 2.00  . Years: 38.00  . Pack years: 76.00  . Types: Cigarettes  . Quit date: 08/16/1990  . Years since quitting: 29.3  Smokeless Tobacco Never Used    Goals Met:  No report of cardiac concerns or symptoms Strength training completed today  Goals Unmet:  Not Applicable  Comments: Service time is from 1015 to 1115 Pt is complaining of back pain up to a day and a half after each exercise session. Discussed with him that he would benefit more from PT to help with strengthening his back muscles. Pt is going to follow up with his PCP about this.   Dr. Fransico Him is Medical Director for Cardiac Rehab at [facility].

## 2019-12-20 NOTE — Telephone Encounter (Signed)
I think it is reasonable for him to switch over to regular physical therapy due to pain and weakness in his back during pulmonary rehab.

## 2019-12-21 NOTE — Telephone Encounter (Signed)
Ok I will call him and tell him the new as  Well as send this to lisa so she can put in a referral for regular PT. Thank you

## 2019-12-21 NOTE — Telephone Encounter (Signed)
I did put in the referral--she just has to push it to them

## 2019-12-24 NOTE — Telephone Encounter (Signed)
Veva Holes do you know if you have put in the referral yet.

## 2019-12-25 ENCOUNTER — Encounter (HOSPITAL_COMMUNITY): Payer: Medicare Other

## 2019-12-25 ENCOUNTER — Telehealth (HOSPITAL_COMMUNITY): Payer: Self-pay

## 2019-12-25 NOTE — Telephone Encounter (Signed)
Pt stated  that Dr. Hollace Kinnier referred him to physical therapy and he will not be able to finish pulmonary rehab.

## 2019-12-27 ENCOUNTER — Encounter (HOSPITAL_COMMUNITY): Payer: Medicare Other

## 2020-01-01 ENCOUNTER — Encounter (HOSPITAL_COMMUNITY): Payer: Medicare Other

## 2020-01-03 ENCOUNTER — Encounter (HOSPITAL_COMMUNITY): Payer: Medicare Other

## 2020-01-07 ENCOUNTER — Ambulatory Visit: Payer: Medicare Other | Attending: Internal Medicine | Admitting: Physical Therapy

## 2020-01-07 ENCOUNTER — Encounter: Payer: Self-pay | Admitting: Physical Therapy

## 2020-01-07 ENCOUNTER — Other Ambulatory Visit: Payer: Self-pay

## 2020-01-07 DIAGNOSIS — R2689 Other abnormalities of gait and mobility: Secondary | ICD-10-CM | POA: Diagnosis not present

## 2020-01-07 DIAGNOSIS — M545 Low back pain, unspecified: Secondary | ICD-10-CM

## 2020-01-07 DIAGNOSIS — M6281 Muscle weakness (generalized): Secondary | ICD-10-CM | POA: Diagnosis not present

## 2020-01-07 DIAGNOSIS — G8929 Other chronic pain: Secondary | ICD-10-CM | POA: Diagnosis not present

## 2020-01-07 NOTE — Therapy (Signed)
[place], [place], [address] Phone: [phone]   Fax:  [phone]  Physical Therapy Evaluation  Patient Details  Name: [name] MRN: [id number] Date of Birth: Mar 12, 1935 Referring Provider (PT): Gayland Curry, DO   Encounter Date: 01/07/2020  PT End of Session - 01/07/20 1100    Visit Number  1    Number of Visits  16    Date for PT Re-Evaluation  03/03/20    Authorization Type  MEDICARE PART A AND B    Progress Note Due on Visit  10    PT Start Time  1045    PT Stop Time  1130    PT Time Calculation (min)  45 min    Activity Tolerance  Patient tolerated treatment well    Behavior During Therapy  [facility] for tasks assessed/performed       Past Medical History:  Diagnosis Date  . Allergic rhinitis, cause unspecified   . Arthritis   . COPD (chronic obstructive pulmonary disease) ([place])   . Dyspnea   . Dysrhythmia    "1 episode of tachycardia in 1980's, been on it ever since"  . Emphysema of lung ([place])   . Hypertension   . Left inguinal hernia 02/07/2019  . Low hemoglobin    low level  . Neuromuscular disorder ([place])   . Neuropathy    feet  . Pneumonia    History of, last Oct 2019  . Pulmonary nodule     Past Surgical History:  Procedure Laterality Date  . APPENDECTOMY  1943  . CARDIAC CATHETERIZATION  2003   performed at [facility], Dr. Fransico Him  . CATARACT EXTRACTION  2008   Dr. Darleen Crocker  . CATARACT EXTRACTION, BILATERAL    . EYE SURGERY    . INGUINAL HERNIA REPAIR Left 02/07/2019   Procedure: OPEN REPAIR LEFT INGUINAL HERNIA WITH MESH;  Surgeon: Fanny Skates, MD;  Location: [facility];  Service: General;  Laterality: Left;  SPINAL AND TAP BLOCK ANESTHESIA  . ROTATOR CUFF REPAIR  2002   Dr. Kathryne Hitch  . TONSILLECTOMY  1945  . VASECTOMY      There were no vitals filed for this visit.   Subjective Assessment - 01/07/20 1048    Subjective  Patient reports that last June  he had hernia surgery and then about a month later he fell following that and fractured L1 vertebrae. He had various stints of PT and currently he has been going through pulmonary rehab for about a month and he states he wasn't getting anywhere with that and he was getting worn out. He asked his doctor for a referral to outpatient PT to work on h strength, balance, and walking. He is currently transitioning from walker to cane outside the house, he is moving around the house some without the cane.    Pertinent History  Former smoker on supplemental O2 2 - 4L, followed for COPD/emphysema, pulmonary nodules, hypoxic respiratory failure, complicated by CAD, Anemia    Limitations  Standing;Walking;House hold activities;Lifting    How long can you sit comfortably?  No limitation as long as soft chair    How long can you stand comfortably?  30 minutes    How long can you walk comfortably?  Mainly limited secondary to breathing    Diagnostic tests  X-ray    Patient Stated Goals  Totally get off the walker, improve with using cane or transition off the cane, get more strength  and improve balance    Currently in Pain?  Yes    Pain Score  0-No pain   3-4/10 when standing   Pain Location  Back    Pain Orientation  Lower    Pain Descriptors / Indicators  Sore    Pain Type  Chronic pain    Pain Onset  More than a month ago    Pain Frequency  Intermittent    Aggravating Factors   Standing    Pain Relieving Factors  Rest, Tylenol    Effect of Pain on Daily Activities  Patient limited in standing and walking         [facility] PT Assessment - 01/07/20 0001      Assessment   Medical Diagnosis  Low back pain    Referring Provider (PT)  Gayland Curry, DO    Onset Date/Surgical Date  02/28/19    Hand Dominance  Right    Next MD Visit  01/21/2020    Prior Therapy  Yes - OPPT and pulmonary rehab      Precautions   Precautions  Fall      Restrictions   Weight Bearing Restrictions  No      Balance Screen    Has the patient fallen in the past 6 months  No   1 fall on 02/28/2019   Has the patient had a decrease in activity level because of a fear of falling?   Yes    Is the patient reluctant to leave their home because of a fear of falling?   No      Home Film/video editor residence    Living Arrangements  Spouse/significant other    Type of [place] to enter    Entrance Stairs-Number of Steps  1    Entrance Stairs-Rails  None    Home Layout  Two level    Alternate Level Stairs-Number of Steps  [place] - 2 wheels;Cane - single point;Shower seat    Additional Comments  Has caregiver who comes in a couple hours per day      Prior Function   Level of Independence  Independent with household mobility with device    Vocation  Retired    Leisure  None reported      Cognition   Overall Cognitive Status  Within Functional Limits for tasks assessed      Observation/Other Assessments   Observations  Patient arrives using supplemental O2 and exhibits difficulty breathing, patient reports hot humid conditions makes breathing harder    Focus on Therapeutic Outcomes (FOTO)   54% limitation      Sensation   Light Touch  Impaired by gross assessment    Additional Comments  Neuropathy of both feet      Posture/Postural Control   Posture Comments  Rounded shoulder and forward head posture      ROM / Strength   AROM / PROM / Strength  AROM;PROM;Strength      AROM   Overall AROM Comments  Lumbar motion grossly limited in all directions with mild discomfort, balance makes assessment difficult      PROM   Overall PROM Comments  Hip PROM grossly WFL and non-painful      Strength   Overall Strength Comments  Strength assessed seated and supine    Strength Assessment Site  Hip;Knee;Ankle    Right/Left Hip  Right;Left  Right Hip Flexion  4/5    Right Hip Extension  3+/5    Right Hip ABduction  3+/5    Left Hip  Flexion  4/5    Left Hip Extension  3+/5    Left Hip ABduction  3+/5    Right/Left Knee  Right;Left    Right Knee Flexion  4+/5    Right Knee Extension  4+/5    Left Knee Flexion  4+/5    Left Knee Extension  4+/5    Right/Left Ankle  Right;Left    Right Ankle Dorsiflexion  5/5    Right Ankle Plantar Flexion  4-/5    Left Ankle Dorsiflexion  5/5    Left Ankle Plantar Flexion  4-/5      Flexibility   Soft Tissue Assessment /Muscle Length  yes    Hamstrings  Limited bilaterally    Quadriceps  Limited bilaterally   and hip flexor   Piriformis  Limitd on left      Palpation   Spinal mobility  Not assessed    Palpation comment  Non TTP      Special Tests    Special Tests  Lumbar    Lumbar Tests  Straight Leg Raise      Straight Leg Raise   Findings  Negative      Transfers   Transfers  Sit to Stand    Sit to Stand  With upper extremity assist      Ambulation/Gait   Ambulation/Gait  Yes    Ambulation/Gait Assistance  6: Modified independent (Device/Increase time)    Assistive device  Straight cane    Gait Comments  Decreased gait speed, uses cane bilaterally, decreased step-length, mildly unsteady gait      Standardized Balance Assessment   Standardized Balance Assessment  Timed Up and Go Test;Five Times Sit to Stand    Five times sit to stand comments   11.41 with use of BUE on chair arm rest for assist      Timed Up and Go Test   TUG  Normal TUG    Normal TUG (seconds)  17.27   use SPC                 Objective measurements completed on examination: See above findings.      [facility] Adult PT Treatment/Exercise - 01/07/20 0001      Exercises   Exercises  Lumbar      Lumbar Exercises: Stretches   Piriformis Stretch  2 reps;30 seconds    Piriformis Stretch Limitations  supine      Lumbar Exercises: Standing   Heel Raises  10 reps    Other Standing Lumbar Exercises  Hip abduction, extension, marching x10 each      Lumbar Exercises: Seated   Sit  to Stand  10 reps    Sit to Stand Limitations  use of BUE for support             PT Education - 01/07/20 1055    Education Details  Exam findings, POC, HEP    Person(s) Educated  Patient    Methods  Explanation;Demonstration;Tactile cues;Verbal cues;Handout    Comprehension  Verbalized understanding;Returned demonstration;Verbal cues required;Tactile cues required;Need further instruction       PT Short Term Goals - 01/07/20 1332      PT SHORT TERM GOAL #1   Title  Patient will be I with initial HEP to progress in PT    Time  4  Period  Weeks    Status  New    Target Date  02/04/20      PT SHORT TERM GOAL #2   Title  Patient will exhibit improved TUG to </=15 sec to improve mobility and reduce fall risk    Time  4    Period  Weeks    Status  New    Target Date  02/04/20      PT SHORT TERM GOAL #3   Title  Patient will be able to perform STS without UE support to indicate improved LE strength    Time  4    Period  Weeks    Status  New    Target Date  02/04/20        PT Long Term Goals - 01/07/20 1337      PT LONG TERM GOAL #1   Title  Patient will be I with final HEP to maintain progress from PT    Time  8    Period  Weeks    Status  New    Target Date  03/03/20      PT LONG TERM GOAL #2   Title  Patient will exhibit improved TUG to < 13.5 sec to reduce fall risk    Time  8    Period  Weeks    Status  New    Target Date  03/03/20      PT LONG TERM GOAL #3   Title  Patient will demonstrate improved strength and fall risk by performing 5xSTS in </= 14 sec without using UE support    Time  8    Period  Weeks    Status  New    Target Date  03/03/20      PT LONG TERM GOAL #4   Title  Patient will report improved functional level to </= 46% limitation on FOTO    Time  8    Period  Weeks    Status  New    Target Date  03/03/20      PT LONG TERM GOAL #5   Title  Patient will report </= 1/10 pain level with standing 30 minutes    Time  8     Period  Weeks    Status  New    Target Date  03/03/20             Plan - 01/07/20 1104    Clinical Impression Statement  Patient presents to PT with report of chronic low back pain, strength and balance impairments due to deconditioning following fall and L1 compression fracture. He does exhibit a general BLE strength deficit and balance impairment with increased fall risk. He also exhibits limitation with lumbar motion and hip flexibility. He would benefit from continued skilled PT to progress strength and balance so he can improve walking ability with LRAD and reduce fall risk.    Personal Factors and Comorbidities  Comorbidity 3+;Age;Fitness;Past/Current Experience;Time since onset of injury/illness/exacerbation    Comorbidities  Former smoker on supplemental O2 2 - 4L, followed for COPD/emphysema, pulmonary nodules, hypoxic respiratory failure, complicated by CAD, Anemia    Examination-Activity Limitations  Locomotion Level;Stand;Lift    Examination-Participation Restrictions  Community Activity;Shop;Yard Work;Cleaning    Stability/Clinical Decision Making  Evolving/Moderate complexity    Clinical Decision Making  Moderate    Rehab Potential  Good    PT Frequency  2x / week    PT Duration  8 weeks    PT Treatment/Interventions  ADLs/Self Care Home Management;Cryotherapy;Electrical Stimulation;Moist Heat;Neuromuscular re-education;DME Instruction;Balance training;Therapeutic exercise;Therapeutic activities;Functional mobility training;Stair training;Gait training;Patient/family education;Manual techniques;Energy conservation;Dry needling;Passive range of motion;Joint Manipulations;Spinal Manipulations    PT Next Visit Plan  Assess HEP and progress PRN, initiate balance training, continue progression of general BLE and core strengthening, hip flexibility    PT Home Exercise Plan  [email or web address]: supine piriformis stretch; standing hip abduction, extension, marching, heel raises, sit<>stand     Consulted and Agree with Plan of Care  Patient       Patient will benefit from skilled therapeutic intervention in order to improve the following deficits and impairments:  Difficulty walking, Decreased endurance, Pain, Decreased activity tolerance, Decreased balance, Postural dysfunction, Decreased strength  Visit Diagnosis: Chronic bilateral low back pain without sciatica  Muscle weakness (generalized)  Other abnormalities of gait and mobility     Problem List Patient Active Problem List   Diagnosis Date Noted  . Benign prostatic hyperplasia with nocturia 09/20/2019  . Closed fracture of first lumbar vertebra ([place]) 09/20/2019  . Idiopathic progressive neuropathy 09/20/2019  . Other fatigue 09/20/2019  . Venous insufficiency of both lower extremities 09/20/2019  . Anemia 07/25/2019  . Left inguinal hernia 02/07/2019  . Left leg pain 05/18/2018  . Pneumonia 05/18/2018  . Chronic respiratory failure with hypoxia ([place]) 01/12/2015  . COPD with acute exacerbation ([place]) 01/21/2013  . Hyperlipidemia 12/01/2011  . Hypertension 12/01/2011  . CAD (coronary artery disease) 05/23/2011  . Lung nodule 09/16/2008  . ALLERGIC RHINITIS 07/24/2007  . COPD mixed type ([place]) 07/24/2007  . BURSITIS 07/24/2007    Hilda Blades, PT, DPT, LAT, ATC 01/07/20  1:56 PM Phone: [phone] Fax: [facility] [address] [place], [place], [address] Phone: [phone]   Fax:  [phone]  Name: [name] MRN: [id number] Date of Birth: November 02, 1934

## 2020-01-07 NOTE — Patient Instructions (Signed)
Access Code: [email or web address] URL: [email or web address] Date: 01/07/2020 Prepared by: Hilda Blades  Exercises Supine Piriformis Stretch with Foot on Ground - 2 x daily - 7 x weekly - 2 reps - 30 seconds hold Standing Hip Abduction with Counter Support - 1 x daily - 7 x weekly - 2 sets - 10 reps Standing Hip Extension with Counter Support - 1 x daily - 7 x weekly - 2 sets - 10 reps Standing March with Counter Support - 1 x daily - 7 x weekly - 2 sets - 10 reps Heel rises with counter support - 1-2 x daily - 7 x weekly - 2 sets - 10 reps Sit to Stand with Counter Support - 1-2 x daily - 7 x weekly - 2 sets - 10 reps

## 2020-01-07 NOTE — Progress Notes (Signed)
Discharge Progress Report  Patient Details  Name: [name] MRN: [id number] Date of Birth: 10/09/1934 Referring Provider:     Pulmonary Rehab Walk Test from 11/07/2019 in [place]  Referring Provider  Dr. Annamaria Boots       Number of Visits: 9  Reason for Discharge:  Early Exit:  Due to back pain post vertebral fracture 1 year ago.  Smoking History:  Social History   Tobacco Use  Smoking Status Former Smoker  . Packs/day: 2.00  . Years: 38.00  . Pack years: 76.00  . Types: Cigarettes  . Quit date: 08/16/1990  . Years since quitting: 29.4  Smokeless Tobacco Never Used    Diagnosis:  Chronic respiratory failure with hypoxia ([facility])  ADL [facility]: Pulmonary Assessment Scores    Row Name 11/07/19 1208 11/07/19 1211       ADL [facility]   ADL Phase  Entry  Entry    SOB Score total  --  90      CAT Score   CAT Score  --  28      mMRC Score   mMRC Score  4  --       Initial Exercise Prescription: Initial Exercise Prescription - 11/07/19 1200      Date of Initial Exercise RX and Referring Provider   Date  11/07/19    Referring Provider  Dr. Annamaria Boots      Oxygen   Oxygen  Continuous    Liters  4      T5 Nustep   Level  2    SPM  80    Minutes  30      Prescription Details   Frequency (times per week)  2    Duration  Progress to 30 minutes of continuous aerobic without signs/symptoms of physical distress      Intensity   THRR 40-80% of Max Heartrate  55-110    Ratings of Perceived Exertion  11-13    Perceived Dyspnea  0-4      Progression   Progression  Continue to progress workloads to maintain intensity without signs/symptoms of physical distress.      Resistance Training   Training Prescription  Yes    Weight  orange bands    Reps  10-15       Discharge Exercise Prescription (Final Exercise Prescription Changes): Exercise Prescription Changes - 12/18/19 1100      Response to Exercise   Blood Pressure (Admit)  110/62     Blood Pressure (Exercise)  130/62    Blood Pressure (Exit)  112/70    Heart Rate (Admit)  95 bpm    Heart Rate (Exercise)  100 bpm    Heart Rate (Exit)  97 bpm    Oxygen Saturation (Admit)  100 %    Oxygen Saturation (Exercise)  100 %    Oxygen Saturation (Exit)  100 %    Rating of Perceived Exertion (Exercise)  15    Perceived Dyspnea (Exercise)  3    Duration  Continue with 30 min of aerobic exercise without signs/symptoms of physical distress.    Intensity  THRR unchanged      Progression   Progression  Continue to progress workloads to maintain intensity without signs/symptoms of physical distress.      Resistance Training   Training Prescription  Yes    Weight  orange bands    Reps  10-15    Time  10 Minutes  Oxygen   Oxygen  Continuous    Liters  4      Arm/Foot Ergometer   Level  1    Minutes  15      T5 Nustep   Level  2    SPM  60    Minutes  15    METs  1.5       Functional Capacity: 6 Minute Walk    Row Name 11/07/19 1210         6 Minute Walk   Phase  Initial     Distance  634 feet     Walk Time  5.5 minutes     # of Rest Breaks  1     MPH  1.2     METS  1.69     RPE  18     Perceived Dyspnea   3     VO2 Peak  5.93     Symptoms  Yes (comment)     Comments  pushed wheelchair. 1 standing rest break for 30 sec due to general fatigue     Resting HR  105 bpm     Resting BP  124/60     Resting Oxygen Saturation   100 %     Exercise Oxygen Saturation  during 6 min walk  99 %     Max Ex. HR  109 bpm     Max Ex. BP  156/64     2 Minute Post BP  118/56       Interval HR   1 Minute HR  106     2 Minute HR  106     3 Minute HR  107     4 Minute HR  109     5 Minute HR  108     6 Minute HR  102     2 Minute Post HR  102     Interval Heart Rate?  Yes       Interval Oxygen   Interval Oxygen?  Yes     Baseline Oxygen Saturation %  100 %     1 Minute Oxygen Saturation %  100 %     1 Minute Liters of Oxygen  4 L     2 Minute Oxygen  Saturation %  100 %     2 Minute Liters of Oxygen  4 L     3 Minute Oxygen Saturation %  100 %     3 Minute Liters of Oxygen  4 L     4 Minute Oxygen Saturation %  100 %     4 Minute Liters of Oxygen  4 L     5 Minute Oxygen Saturation %  100 %     5 Minute Liters of Oxygen  4 L     6 Minute Oxygen Saturation %  99 %     6 Minute Liters of Oxygen  4 L     2 Minute Post Oxygen Saturation %  100 %     2 Minute Post Liters of Oxygen  4 L        Psychological, QOL, Others - Outcomes: PHQ 2/9: Depression screen [facility] 2/9 11/07/2019 10/18/2019 09/20/2019  Decreased Interest 0 0 0  Down, Depressed, Hopeless 0 0 0  PHQ - 2 Score 0 0 0  Altered sleeping 0 - -  Tired, decreased energy 1 - -  Change in appetite 1 - -  Feeling bad or failure  about yourself  0 - -  Trouble concentrating 0 - -  Moving slowly or fidgety/restless 0 - -  Suicidal thoughts 0 - -  PHQ-9 Score 2 - -  Difficult doing work/chores Not difficult at all - -  Some recent data might be hidden    Quality of Life:   Personal Goals: Goals established at orientation with interventions provided to work toward goal. Personal Goals and Risk Factors at Admission - 11/07/19 1217      Core Components/Risk Factors/Patient Goals on Admission   Improve shortness of breath with ADL's  Yes    Intervention  Provide education, individualized exercise plan and daily activity instruction to help decrease symptoms of SOB with activities of daily living.    Expected Outcomes  Short Term: Improve cardiorespiratory fitness to achieve a reduction of symptoms when performing ADLs;Long Term: Be able to perform more ADLs without symptoms or delay the onset of symptoms        Personal Goals Discharge: Goals and Risk Factor Review    Row Name 11/07/19 1218 12/03/19 1311           Core Components/Risk Factors/Patient Goals Review   Personal Goals Review  Develop more efficient breathing techniques such as purse lipped breathing and  diaphragmatic breathing and practicing self-pacing with activity.;Increase knowledge of respiratory medications and ability to use respiratory devices properly.;Improve shortness of breath with ADL's  Develop more efficient breathing techniques such as purse lipped breathing and diaphragmatic breathing and practicing self-pacing with activity.;Increase knowledge of respiratory medications and ability to use respiratory devices properly.;Improve shortness of breath with ADL's      Review  --  [name] is very deconditioned, he is exercising on the nustep @ level 2 for 30 minutes and takes multiple rest breaks, encouraged to take less breaks and push himself more.  At this point, he is slow to progress.      Expected Outcomes  --  See admission goals.         Exercise Goals and Review: Exercise Goals    Row Name 11/07/19 1219 12/04/19 0744           Exercise Goals   Increase Physical Activity  Yes  Yes      Intervention  Provide advice, education, support and counseling about physical activity/exercise needs.;Develop an individualized exercise prescription for aerobic and resistive training based on initial evaluation findings, risk stratification, comorbidities and participant's personal goals.  Provide advice, education, support and counseling about physical activity/exercise needs.;Develop an individualized exercise prescription for aerobic and resistive training based on initial evaluation findings, risk stratification, comorbidities and participant's personal goals.      Expected Outcomes  Short Term: Attend rehab on a regular basis to increase amount of physical activity.;Long Term: Add in home exercise to make exercise part of routine and to increase amount of physical activity.;Long Term: Exercising regularly at least 3-5 days a week.  Short Term: Attend rehab on a regular basis to increase amount of physical activity.;Long Term: Add in home exercise to make exercise part of routine and to increase  amount of physical activity.;Long Term: Exercising regularly at least 3-5 days a week.      Increase Strength and Stamina  Yes  Yes      Intervention  Provide advice, education, support and counseling about physical activity/exercise needs.;Develop an individualized exercise prescription for aerobic and resistive training based on initial evaluation findings, risk stratification, comorbidities and participant's personal goals.  Provide advice, education, support and  counseling about physical activity/exercise needs.;Develop an individualized exercise prescription for aerobic and resistive training based on initial evaluation findings, risk stratification, comorbidities and participant's personal goals.      Expected Outcomes  Short Term: Increase workloads from initial exercise prescription for resistance, speed, and METs.;Short Term: Perform resistance training exercises routinely during rehab and add in resistance training at home;Long Term: Improve cardiorespiratory fitness, muscular endurance and strength as measured by increased METs and functional capacity (6MWT)  Short Term: Increase workloads from initial exercise prescription for resistance, speed, and METs.;Short Term: Perform resistance training exercises routinely during rehab and add in resistance training at home;Long Term: Improve cardiorespiratory fitness, muscular endurance and strength as measured by increased METs and functional capacity (6MWT)      Able to understand and use rate of perceived exertion (RPE) scale  Yes  Yes      Intervention  Provide education and explanation on how to use RPE scale  Provide education and explanation on how to use RPE scale      Expected Outcomes  Short Term: Able to use RPE daily in rehab to express subjective intensity level;Long Term:  Able to use RPE to guide intensity level when exercising independently  Short Term: Able to use RPE daily in rehab to express subjective intensity level;Long Term:  Able to  use RPE to guide intensity level when exercising independently      Able to understand and use Dyspnea scale  Yes  Yes      Intervention  Provide education and explanation on how to use Dyspnea scale  Provide education and explanation on how to use Dyspnea scale      Expected Outcomes  Short Term: Able to use Dyspnea scale daily in rehab to express subjective sense of shortness of breath during exertion;Long Term: Able to use Dyspnea scale to guide intensity level when exercising independently  Short Term: Able to use Dyspnea scale daily in rehab to express subjective sense of shortness of breath during exertion;Long Term: Able to use Dyspnea scale to guide intensity level when exercising independently      Knowledge and understanding of Target Heart Rate Range (THRR)  Yes  Yes      Intervention  Provide education and explanation of THRR including how the numbers were predicted and where they are located for reference  Provide education and explanation of THRR including how the numbers were predicted and where they are located for reference      Expected Outcomes  Short Term: Able to state/look up THRR;Short Term: Able to use daily as guideline for intensity in rehab;Long Term: Able to use THRR to govern intensity when exercising independently  Short Term: Able to state/look up THRR;Short Term: Able to use daily as guideline for intensity in rehab;Long Term: Able to use THRR to govern intensity when exercising independently      Understanding of Exercise Prescription  Yes  Yes      Intervention  Provide education, explanation, and written materials on patient's individual exercise prescription  Provide education, explanation, and written materials on patient's individual exercise prescription      Expected Outcomes  Short Term: Able to explain program exercise prescription;Long Term: Able to explain home exercise prescription to exercise independently  Short Term: Able to explain program exercise  prescription;Long Term: Able to explain home exercise prescription to exercise independently         Exercise Goals Re-Evaluation: Exercise Goals Re-Evaluation    Row Name 12/04/19 [phone]  Exercise Goal Re-Evaluation   Exercise Goals Review  Increase Physical Activity;Increase Strength and Stamina;Able to understand and use rate of perceived exertion (RPE) scale;Able to understand and use Dyspnea scale;Knowledge and understanding of Target Heart Rate Range (THRR);Understanding of Exercise Prescription       Comments  Pt has completed 5 exercise sessions. Pt is severely deconditioned. He takes rest breaks every 3-4 minutes on the stepper and is currently exercising at 1.4 METs. I do not know if it will be possible to progress him any further. Will continue to monitor and progress as able.       Expected Outcomes  Through exercise at rehab and at home, the patient will decrease shortness of breath with daily activities and feel confident in carrying out an exercise regime at home.          Nutrition & Weight - Outcomes: Pre Biometrics - 11/07/19 1105      Pre Biometrics   Grip Strength  28 kg        Nutrition: Nutrition Therapy & Goals - 11/27/19 1127      Nutrition Therapy   Diet  High cal/high protein      Personal Nutrition Goals   Nutrition Goal  Pt to monitor appetite and weight    Personal Goal #2  Pt to continue drinking protein shake daily    Personal Goal #3  Pt to increase protein shake BID if appetite decreases      Intervention Plan   Intervention  Prescribe, educate and counsel regarding individualized specific dietary modifications aiming towards targeted core components such as weight, hypertension, lipid management, diabetes, heart failure and other comorbidities.    Expected Outcomes  Short Term Goal: A plan has been developed with personal nutrition goals set during dietitian appointment.       Nutrition Discharge: Nutrition Assessments -  11/15/19 1134      Rate Your Plate Scores   Pre Score  51       Education Questionnaire Score: Knowledge Questionnaire Score - 11/07/19 1214      Knowledge Questionnaire Score   Pre Score  16/18       Goals reviewed with patient; copy given to patient.

## 2020-01-08 ENCOUNTER — Encounter (HOSPITAL_COMMUNITY): Payer: Medicare Other

## 2020-01-10 ENCOUNTER — Encounter (HOSPITAL_COMMUNITY): Payer: Medicare Other

## 2020-01-17 ENCOUNTER — Other Ambulatory Visit: Payer: Self-pay

## 2020-01-17 DIAGNOSIS — E7800 Pure hypercholesterolemia, unspecified: Secondary | ICD-10-CM

## 2020-01-17 DIAGNOSIS — D649 Anemia, unspecified: Secondary | ICD-10-CM

## 2020-01-17 DIAGNOSIS — I10 Essential (primary) hypertension: Secondary | ICD-10-CM

## 2020-01-18 ENCOUNTER — Other Ambulatory Visit: Payer: Self-pay

## 2020-01-18 ENCOUNTER — Other Ambulatory Visit: Payer: Medicare Other

## 2020-01-18 DIAGNOSIS — E7800 Pure hypercholesterolemia, unspecified: Secondary | ICD-10-CM

## 2020-01-18 DIAGNOSIS — D649 Anemia, unspecified: Secondary | ICD-10-CM

## 2020-01-18 DIAGNOSIS — I10 Essential (primary) hypertension: Secondary | ICD-10-CM

## 2020-01-18 LAB — CBC WITH DIFFERENTIAL/PLATELET
Absolute Monocytes: 385 cells/uL (ref 200–950)
Basophils Absolute: 42 cells/uL (ref 0–200)
Basophils Relative: 0.6 %
Eosinophils Absolute: 119 cells/uL (ref 15–500)
Eosinophils Relative: 1.7 %
HCT: 37.7 % — ABNORMAL LOW (ref 38.5–50.0)
Hemoglobin: 12.2 g/dL — ABNORMAL LOW (ref 13.2–17.1)
Lymphs Abs: 875 cells/uL (ref 850–3900)
MCH: 28.9 pg (ref 27.0–33.0)
MCHC: 32.4 g/dL (ref 32.0–36.0)
MCV: 89.3 fL (ref 80.0–100.0)
MPV: 10.1 fL (ref 7.5–12.5)
Monocytes Relative: 5.5 %
Neutro Abs: 5579 cells/uL (ref 1500–7800)
Neutrophils Relative %: 79.7 %
Platelets: 217 10*3/uL (ref 140–400)
RBC: 4.22 10*6/uL (ref 4.20–5.80)
RDW: 12.3 % (ref 11.0–15.0)
Total Lymphocyte: 12.5 %
WBC: 7 10*3/uL (ref 3.8–10.8)

## 2020-01-18 LAB — COMPREHENSIVE METABOLIC PANEL
AG Ratio: 2.1 (calc) (ref 1.0–2.5)
ALT: 15 U/L (ref 9–46)
AST: 17 U/L (ref 10–35)
Albumin: 4.2 g/dL (ref 3.6–5.1)
Alkaline phosphatase (APISO): 40 U/L (ref 35–144)
BUN/Creatinine Ratio: 31 (calc) — ABNORMAL HIGH (ref 6–22)
BUN: 15 mg/dL (ref 7–25)
CO2: 32 mmol/L (ref 20–32)
Calcium: 9.4 mg/dL (ref 8.6–10.3)
Chloride: 99 mmol/L (ref 98–110)
Creat: 0.49 mg/dL — ABNORMAL LOW (ref 0.70–1.11)
Globulin: 2 g/dL (calc) (ref 1.9–3.7)
Glucose, Bld: 100 mg/dL — ABNORMAL HIGH (ref 65–99)
Potassium: 4.7 mmol/L (ref 3.5–5.3)
Sodium: 136 mmol/L (ref 135–146)
Total Bilirubin: 0.7 mg/dL (ref 0.2–1.2)
Total Protein: 6.2 g/dL (ref 6.1–8.1)

## 2020-01-18 LAB — LIPID PANEL
Cholesterol: 171 mg/dL (ref <200)
HDL: 86 mg/dL (ref >=40)
LDL Cholesterol (Calc): 73 mg/dL (calc)
Non-HDL Cholesterol (Calc): 85 mg/dL (calc) (ref <130)
Total CHOL/HDL Ratio: 2 (calc) (ref <5.0)
Triglycerides: 41 mg/dL (ref <150)

## 2020-01-21 ENCOUNTER — Other Ambulatory Visit: Payer: Self-pay

## 2020-01-21 ENCOUNTER — Ambulatory Visit: Payer: Medicare Other | Admitting: Internal Medicine

## 2020-01-21 ENCOUNTER — Ambulatory Visit (INDEPENDENT_AMBULATORY_CARE_PROVIDER_SITE_OTHER): Payer: Medicare Other | Admitting: Internal Medicine

## 2020-01-21 ENCOUNTER — Encounter: Payer: Self-pay | Admitting: Internal Medicine

## 2020-01-21 VITALS — BP 118/72 | HR 78 | Temp 97.7°F | Ht 67.0 in | Wt 142.0 lb

## 2020-01-21 DIAGNOSIS — I872 Venous insufficiency (chronic) (peripheral): Secondary | ICD-10-CM | POA: Diagnosis not present

## 2020-01-21 DIAGNOSIS — I25119 Atherosclerotic heart disease of native coronary artery with unspecified angina pectoris: Secondary | ICD-10-CM | POA: Diagnosis not present

## 2020-01-21 DIAGNOSIS — Z66 Do not resuscitate: Secondary | ICD-10-CM

## 2020-01-21 DIAGNOSIS — G603 Idiopathic progressive neuropathy: Secondary | ICD-10-CM

## 2020-01-21 DIAGNOSIS — J449 Chronic obstructive pulmonary disease, unspecified: Secondary | ICD-10-CM | POA: Diagnosis not present

## 2020-01-21 DIAGNOSIS — R351 Nocturia: Secondary | ICD-10-CM

## 2020-01-21 DIAGNOSIS — D649 Anemia, unspecified: Secondary | ICD-10-CM | POA: Diagnosis not present

## 2020-01-21 DIAGNOSIS — J9611 Chronic respiratory failure with hypoxia: Secondary | ICD-10-CM | POA: Diagnosis not present

## 2020-01-21 DIAGNOSIS — N401 Enlarged prostate with lower urinary tract symptoms: Secondary | ICD-10-CM | POA: Diagnosis not present

## 2020-01-21 NOTE — Progress Notes (Signed)
Anemia is stable (mild)  Cholesterol is at goal Electrolytes, liver are normal and kidney function is ok--he has low creatinine which is consistent with his loss of muscle mass related to his COPD and deconditioning after his compression fracture. We'll discuss at the visit.

## 2020-01-21 NOTE — Progress Notes (Signed)
Location:  [facility] clinic Provider:  Jais Demir L. Mariea Clonts, D.O., C.M.D.  Code Status: DNR Goals of Care:  Advanced Directives 01/21/2020  Does Patient Have a Medical Advance Directive? Yes  Type of Paramedic of [place];Living will  Does patient want to make changes to medical advance directive? No - Patient declined  Copy of [place] in Chart? -  Would patient like information on creating a medical advance directive? -   Chief Complaint  Patient presents with  . Medical Management of Chronic Issues    3 month follow up    HPI: Patient is a 85 y.o. male seen today for medical management of chronic diseases--he has chronic hypoxic respiratory failure from his COPD, frailty, protein-calorie malnutrition, and is still recovering from a compression fracture of his spine.    His back has been doing much better.  He remembers the surgeon telling him that within a year he'd be at about 80%.  He thinks that's where he is and he can live with it.  If he's on his feet for 30 mins to an hour, bothers him but no real significant pain.  He had a single session of PT a week ago.  Second session is Wednesday--too early to tell.    Not a great improvement with energy levels.    Dr. Annamaria Boots wanted his hgb 13-15.  He's at 12.3.  Explained his anemia is normocytic and last ferritin and b12 ok so taking these may not help him more in this realm b/c anemia is due to chronic disease.  His cholesterol is at goal.  He's happy about this b/c he'd like to keep eating ice cream.  He went to urology for a number of years.  He had stopped going and Dr. Rex Kras managed his PSA.  The only symptom he has is nocturia.  He knows this can mean enlarged prostate.   He did take finasteride at one point.  He's not bothered during the day.    He is dealing with neuropathy.  He stopped gabapentin at one point.  His neuropathy is not causing pain.  It's more the numbness feeling.    He  wonders about doing the sleep test at home.  He's decided he does not want to deal with that now.  Still does have slight headache in am.  He thinks he sleeps with his mouth open and asks if there is something to keep it closed.    Tongue color--stays brown for year.  He always rinses his mouth after inhaler.  Has not done regular tongue cleaning.    He has well-spring solutions 2 hours a day.    Past Medical History:  Diagnosis Date  . Allergic rhinitis, cause unspecified   . Arthritis   . COPD (chronic obstructive pulmonary disease) ([place])   . Dyspnea   . Dysrhythmia    "1 episode of tachycardia in 1980's, been on it ever since"  . Emphysema of lung ([place])   . Hypertension   . Left inguinal hernia 02/07/2019  . Low hemoglobin    low level  . Neuromuscular disorder ([place])   . Neuropathy    feet  . Pneumonia    History of, last Oct 2019  . Pulmonary nodule     Past Surgical History:  Procedure Laterality Date  . APPENDECTOMY  1943  . CARDIAC CATHETERIZATION  2003   performed at [facility], Dr. Fransico Him  . CATARACT EXTRACTION  2008   Dr.  Darleen Crocker  . CATARACT EXTRACTION, BILATERAL    . EYE SURGERY    . INGUINAL HERNIA REPAIR Left 02/07/2019   Procedure: OPEN REPAIR LEFT INGUINAL HERNIA WITH MESH;  Surgeon: Fanny Skates, MD;  Location: [facility];  Service: General;  Laterality: Left;  SPINAL AND TAP BLOCK ANESTHESIA  . ROTATOR CUFF REPAIR  2002   Dr. Kathryne Hitch  . TONSILLECTOMY  1945  . VASECTOMY      Allergies  Allergen Reactions  . Tolectin [Tolmetin Sodium] Other (See Comments)    BP low and passed out  . Montelukast Sodium Other (See Comments)    Unknown rxn;  . Nsaids   . Augmentin [Amoxicillin-Pot Clavulanate] Nausea And Vomiting    Did it involve swelling of the face/tongue/throat, SOB, or low BP? No Did it involve sudden or severe rash/hives, skin peeling, or any reaction on the inside of your mouth or nose? No Did you need to seek medical attention at a  hospital or doctor's office? No When did it last happen?Last year If all above answers are "NO", may proceed with cephalosporin use.  . Neosporin [Bacitracin-Polymyxin B] Rash    Outpatient Encounter Medications as of 01/21/2020  Medication Sig  . acetaminophen (TYLENOL) 500 MG tablet Take 500 mg by mouth See admin instructions. 2 - 4 times a day  . aspirin EC 81 MG tablet Take 1 tablet (81 mg total) by mouth daily.  . Budeson-Glycopyrrol-Formoterol (BREZTRI AEROSPHERE) 160-9-4.8 MCG/ACT AERO Inhale 2 puffs into the lungs in the morning and at bedtime.  [name] losartan (COZAAR) 25 MG tablet TAKE 1 TABLET(25 MG) BY MOUTH DAILY  . Nutritional Supplements (ENSURE HIGH PROTEIN) LIQD Take 1 Bottle by mouth daily.  . OXYGEN Inhale 3-6 L into the lungs continuous.   . polyethylene glycol powder (GLYCOLAX/MIRALAX) powder Take 17 g by mouth daily.  . pravastatin (PRAVACHOL) 40 MG tablet Take 1 tablet (40 mg total) by mouth daily.  . tamsulosin (FLOMAX) 0.4 MG CAPS capsule Take 1 capsule (0.4 mg total) by mouth daily.  . vitamin B-12 (CYANOCOBALAMIN) 1000 MCG tablet Take 1 tablet (1,000 mcg total) by mouth daily.  . [DISCONTINUED] budesonide-formoterol (SYMBICORT) 160-4.5 MCG/ACT inhaler INHALE 2 PUFFS BY MOUTH INTO THE LUNGS TWICE DAILY. RINSE MOUTH  . [DISCONTINUED] Tiotropium Bromide Monohydrate (SPIRIVA RESPIMAT) 2.5 MCG/ACT AERS Inhale 2 puffs into the lungs daily.   No facility-administered encounter medications on file as of 01/21/2020.    Review of Systems:  Review of Systems  Constitutional: Negative for chills and fever.  HENT: Negative for congestion and sore throat.   Eyes: Negative for blurred vision.       Glasses  Respiratory: Positive for cough, sputum production, shortness of breath and wheezing.        No acute changes  Cardiovascular: Positive for leg swelling. Negative for chest pain and palpitations.  Gastrointestinal: Negative for abdominal pain.  Genitourinary: Negative for  dysuria.       Nocturia is annoying  Musculoskeletal: Positive for back pain. Negative for falls.  Skin: Negative for itching and rash.  Neurological: Positive for tingling and sensory change. Negative for dizziness and loss of consciousness.  Endo/Heme/Allergies: Bruises/bleeds easily.  Psychiatric/Behavioral: Negative for depression and memory loss. The patient is not nervous/anxious and does not have insomnia.     Health Maintenance  Topic Date Due  . INFLUENZA VACCINE  03/16/2020  . TETANUS/TDAP  08/25/2020  . COVID-19 Vaccine  Completed  . PNA vac Low Risk Adult  Completed  Physical Exam: Vitals:   01/21/20 1052  BP: 118/72  Pulse: 78  Temp: 97.7 F (36.5 C)  TempSrc: Temporal  SpO2: 99%  Weight: 142 lb (64.4 kg)  Height: 5\' 7"  (1.702 m)   Body mass index is 22.24 kg/m. Physical Exam Vitals reviewed.  Constitutional:      General: He is not in acute distress.    Appearance: He is not toxic-appearing.     Comments: Cachectic male, ambulating now with cane and his portable oxygen  HENT:     Head: Normocephalic and atraumatic.  Eyes:     Comments: glasses  Cardiovascular:     Rate and Rhythm: Normal rate and regular rhythm.     Heart sounds: No murmur.  Pulmonary:     Effort: Pulmonary effort is normal.     Breath sounds: Normal breath sounds.     Comments: No wheezing, but some crackles at bases Musculoskeletal:        General: Normal range of motion.     Cervical back: Neck supple.     Right lower leg: Edema present.     Left lower leg: Edema present.     Comments: Bilateral pitting edema, hemosiderin deposits in skin of lower legs  Neurological:     General: No focal deficit present.     Mental Status: He is alert and oriented to person, place, and time.     Sensory: Sensory deficit present.     Gait: Gait abnormal.  Psychiatric:        Mood and Affect: Mood normal.        Behavior: Behavior normal.        Thought Content: Thought content normal.         Judgment: Judgment normal.     Labs reviewed: Basic Metabolic Panel: Recent Labs    01/31/19 1126 02/07/19 1050 02/28/19 0811 07/23/19 0000 01/18/20 1037  NA 135  --  134* 137 136  K 4.4  --  4.5 5.0 4.7  CL 100  --  97* 97* 99  CO2 26  --  27 36* 32  GLUCOSE 94  --  94  --  100*  BUN 12  --  11 15 15   CREATININE 0.58*   < > 0.54* 0.4* 0.49*  CALCIUM 9.1  --  9.0 9.4 9.4   < > = values in this interval not displayed.   Liver Function Tests: Recent Labs    02/28/19 0811 07/23/19 0000 01/18/20 1037  AST 21  --  17  ALT 17  --  15  ALKPHOS 43  --   --   BILITOT 1.2  --  0.7  PROT 6.2*  --  6.2  ALBUMIN 3.5 3.9  --    No results for input(s): LIPASE, AMYLASE in the last 8760 hours. No results for input(s): AMMONIA in the last 8760 hours. CBC: Recent Labs    02/28/19 0811 02/28/19 0811 06/11/19 1213 08/02/19 0000 01/18/20 1037  WBC 9.9   < > 9.2 8.7 7.0  NEUTROABS 8.1*  --  8.1*  --  5,579  HGB 12.4*   < > 11.8* 12.5* 12.2*  HCT 38.9*   < > 37.2*  36.4* 37* 37.7*  MCV 90.5  --  90.7  --  89.3  PLT 209   < > 226 234 217   < > = values in this interval not displayed.   Lipid Panel: Recent Labs    01/18/20 1037  CHOL 171  HDL 86  LDLCALC 73  TRIG 41  CHOLHDL 2.0    Assessment/Plan 1. Chronic respiratory failure with hypoxia (HCC) -remains biggest issue due to #2 -cont oxygen  -discussed that am headaches may be due to hypoxia with mouth breathing -he wants to hold off on sleep apnea testing  2. COPD mixed type ([place]) -stable with current hinhaler regimen  3. Anemia, unspecified type - not quite to level desired by pulmonary, but iron was normal last check and b12 repleted - Basic metabolic panel; Future - CBC with Differential/Platelet; Future  4. Idiopathic progressive neuropathy -ongoing -does not want drugs for this -agrees to try capsaicin topical cream  5. Benign prostatic hyperplasia with nocturia -nocturia is  considerable -cont flomax -add myrbetriq 25mg  daily--when samples arrive we will call him  6. Venous insufficiency of both lower extremities -ongoing, elevate feet at rest -compression hose would be a challenge  7. DNR (do not resuscitate) - Do not attempt resuscitation (DNR) -living will and hcpoa documents brought today and will be scanned into vynca  Labs/tests ordered:   Lab Orders     Basic metabolic panel     CBC with Differential/Platelet  Next appt:  05/19/2020 with fasting labs before Cassell Voorhies L. Ilayda Toda, D.O. [facility] [address], [place] [address] Cell Phone (Mon-Fri 8am-5pm):  [phone] On Call:  [phone] & follow prompts after 5pm & weekends Office Phone:  [phone] Office Fax:  [phone]

## 2020-01-21 NOTE — Patient Instructions (Signed)
Try capsaicin cream for your neuropathy.  It's over the counter.

## 2020-01-23 ENCOUNTER — Encounter: Payer: Self-pay | Admitting: Physical Therapy

## 2020-01-23 ENCOUNTER — Ambulatory Visit: Payer: Medicare Other | Attending: Internal Medicine | Admitting: Physical Therapy

## 2020-01-23 ENCOUNTER — Other Ambulatory Visit: Payer: Self-pay

## 2020-01-23 DIAGNOSIS — M545 Low back pain, unspecified: Secondary | ICD-10-CM

## 2020-01-23 DIAGNOSIS — G8929 Other chronic pain: Secondary | ICD-10-CM | POA: Diagnosis present

## 2020-01-23 DIAGNOSIS — M6281 Muscle weakness (generalized): Secondary | ICD-10-CM | POA: Insufficient documentation

## 2020-01-23 DIAGNOSIS — R2681 Unsteadiness on feet: Secondary | ICD-10-CM | POA: Diagnosis present

## 2020-01-23 DIAGNOSIS — R2689 Other abnormalities of gait and mobility: Secondary | ICD-10-CM | POA: Diagnosis present

## 2020-01-23 DIAGNOSIS — R262 Difficulty in walking, not elsewhere classified: Secondary | ICD-10-CM | POA: Insufficient documentation

## 2020-01-23 NOTE — Patient Instructions (Signed)
Access Code: [email or web address] URL: [email or web address] Date: 01/23/2020 Prepared by: Hilda Blades  Exercises Supine Piriformis Stretch with Foot on Ground - 2 x daily - 7 x weekly - 2 reps - 30 seconds hold Standing Hip Abduction with Counter Support - 1 x daily - 7 x weekly - 2 sets - 10 reps Standing Hip Extension with Counter Support - 1 x daily - 7 x weekly - 2 sets - 10 reps Standing March with Counter Support - 1 x daily - 7 x weekly - 2 sets - 10 reps Heel rises with counter support - 1-2 x daily - 7 x weekly - 2 sets - 10 reps Sit to Stand with Counter Support - 1-2 x daily - 7 x weekly - 2 sets - 10 reps Standing Tandem Balance with Counter Support - 1-2 x daily - 7 x weekly - 3 reps - 20 seconds hold

## 2020-01-23 NOTE — Therapy (Signed)
[place], [place], [address] Phone: [phone]   Fax:  [phone]  Physical Therapy Treatment  Patient Details  Name: [name] MRN: [id number] Date of Birth: 11/25/1934 Referring Provider (PT): Gayland Curry, DO   Encounter Date: 01/23/2020  PT End of Session - 01/23/20 1124    Visit Number  2    Number of Visits  16    Date for PT Re-Evaluation  03/03/20    Authorization Type  MEDICARE PART A AND B    Progress Note Due on Visit  10    PT Start Time  1128    PT Stop Time  1210    PT Time Calculation (min)  42 min    Activity Tolerance  Patient tolerated treatment well    Behavior During Therapy  [facility] for tasks assessed/performed       Past Medical History:  Diagnosis Date  . Allergic rhinitis, cause unspecified   . Arthritis   . COPD (chronic obstructive pulmonary disease) ([place])   . Dyspnea   . Dysrhythmia    "1 episode of tachycardia in 1980's, been on it ever since"  . Emphysema of lung ([place])   . Hypertension   . Left inguinal hernia 02/07/2019  . Low hemoglobin    low level  . Neuromuscular disorder ([place])   . Neuropathy    feet  . Pneumonia    History of, last Oct 2019  . Pulmonary nodule     Past Surgical History:  Procedure Laterality Date  . APPENDECTOMY  1943  . CARDIAC CATHETERIZATION  2003   performed at [facility], Dr. Fransico Him  . CATARACT EXTRACTION  2008   Dr. Darleen Crocker  . CATARACT EXTRACTION, BILATERAL    . EYE SURGERY    . INGUINAL HERNIA REPAIR Left 02/07/2019   Procedure: OPEN REPAIR LEFT INGUINAL HERNIA WITH MESH;  Surgeon: Fanny Skates, MD;  Location: [facility];  Service: General;  Laterality: Left;  SPINAL AND TAP BLOCK ANESTHESIA  . ROTATOR CUFF REPAIR  2002   Dr. Kathryne Hitch  . TONSILLECTOMY  1945  . VASECTOMY      There were no vitals filed for this visit.  Subjective Assessment - 01/23/20 1132    Subjective  Patient reports nothig new going on  since last visit. He has been doing the exercises about every other day and they are still making him sore. He does feel the stretches have helped. He has been walking around the house without the cane some.    Patient Stated Goals  Totally get off the walker, improve with using cane or transition off the cane, get more strength and improve balance    Currently in Pain?  No/denies         [facility] PT Assessment - 01/23/20 0001      Functional Tests   Functional tests  Sit to Stand      Sit to Stand   Comments  Able to perform with min-mod assist of BUE on thighs                    [facility] Adult PT Treatment/Exercise - 01/23/20 0001      Neuro Re-ed    Neuro Re-ed Details   1/2 tanden x20 sec, 3/4 tandem x20 seconds, full tandem 2x20 seconds      Exercises   Exercises  Lumbar      Lumbar Exercises: Stretches   Passive Hamstring  Stretch  30 seconds    Passive Hamstring Stretch Limitations  supine    Single Knee to Chest Stretch  30 seconds    Single Knee to Chest Stretch Limitations  supine    Lower Trunk Rotation  5 reps;10 seconds    Piriformis Stretch  30 seconds    Piriformis Stretch Limitations  supine      Lumbar Exercises: Aerobic   Nustep  5 min (LE only), 3:30 at L4, 1:30 at L1      Lumbar Exercises: Seated   Sit to Stand  5 reps   2 sets   Sit to Stand Limitations  hands on thighs for support      Lumbar Exercises: Supine   Bridge  5 reps   2 sets   Straight Leg Raise  5 reps   2 sets            PT Education - 01/23/20 1123    Education Details  HEP    Person(s) Educated  Patient    Methods  Explanation;Demonstration;Handout;Verbal cues    Comprehension  Verbalized understanding;Returned demonstration;Verbal cues required;Need further instruction       PT Short Term Goals - 01/07/20 1332      PT SHORT TERM GOAL #1   Title  Patient will be I with initial HEP to progress in PT    Time  4    Period  Weeks    Status  New    Target Date   02/04/20      PT SHORT TERM GOAL #2   Title  Patient will exhibit improved TUG to </=15 sec to improve mobility and reduce fall risk    Time  4    Period  Weeks    Status  New    Target Date  02/04/20      PT SHORT TERM GOAL #3   Title  Patient will be able to perform STS without UE support to indicate improved LE strength    Time  4    Period  Weeks    Status  New    Target Date  02/04/20        PT Long Term Goals - 01/07/20 1337      PT LONG TERM GOAL #1   Title  Patient will be I with final HEP to maintain progress from PT    Time  8    Period  Weeks    Status  New    Target Date  03/03/20      PT LONG TERM GOAL #2   Title  Patient will exhibit improved TUG to < 13.5 sec to reduce fall risk    Time  8    Period  Weeks    Status  New    Target Date  03/03/20      PT LONG TERM GOAL #3   Title  Patient will demonstrate improved strength and fall risk by performing 5xSTS in </= 14 sec without using UE support    Time  8    Period  Weeks    Status  New    Target Date  03/03/20      PT LONG TERM GOAL #4   Title  Patient will report improved functional level to </= 46% limitation on FOTO    Time  8    Period  Weeks    Status  New    Target Date  03/03/20      PT LONG TERM  GOAL #5   Title  Patient will report </= 1/10 pain level with standing 30 minutes    Time  8    Period  Weeks    Status  New    Target Date  03/03/20            Plan - 01/23/20 1124    Clinical Impression Statement  Patient tolerated therapy well with no adverse effects. This visit focused on improve flexibility and strength of core, hips, BLEs. He did not report any increased pain this visit but does require frequent rest breaks due to shortness of breath and continues to exhibit gross strength deficit. Initiated balance training with good tolerance. He would benefit from continued skilled PT to progress strength and balance so he can improve walking ability with LRAD and reduce fall  risk.    PT Treatment/Interventions  ADLs/Self Care Home Management;Cryotherapy;Electrical Stimulation;Moist Heat;Neuromuscular re-education;DME Instruction;Balance training;Therapeutic exercise;Therapeutic activities;Functional mobility training;Stair training;Gait training;Patient/family education;Manual techniques;Energy conservation;Dry needling;Passive range of motion;Joint Manipulations;Spinal Manipulations    PT Next Visit Plan  Assess HEP and progress PRN, initiate balance training, continue progression of general BLE and core strengthening, hip flexibility    PT Home Exercise Plan  [email or web address]: supine piriformis stretch; standing hip abduction, extension, marching, heel raises, sit<>stand, tandem balance    Consulted and Agree with Plan of Care  Patient       Patient will benefit from skilled therapeutic intervention in order to improve the following deficits and impairments:  Difficulty walking, Decreased endurance, Pain, Decreased activity tolerance, Decreased balance, Postural dysfunction, Decreased strength  Visit Diagnosis: Chronic bilateral low back pain without sciatica  Muscle weakness (generalized)  Other abnormalities of gait and mobility     Problem List Patient Active Problem List   Diagnosis Date Noted  . Benign prostatic hyperplasia with nocturia 09/20/2019  . Closed fracture of first lumbar vertebra ([place]) 09/20/2019  . Idiopathic progressive neuropathy 09/20/2019  . Other fatigue 09/20/2019  . Venous insufficiency of both lower extremities 09/20/2019  . Anemia 07/25/2019  . Left inguinal hernia 02/07/2019  . Left leg pain 05/18/2018  . Pneumonia 05/18/2018  . Chronic respiratory failure with hypoxia ([place]) 01/12/2015  . COPD with acute exacerbation ([place]) 01/21/2013  . Hyperlipidemia 12/01/2011  . Hypertension 12/01/2011  . CAD (coronary artery disease) 05/23/2011  . Lung nodule 09/16/2008  . ALLERGIC RHINITIS 07/24/2007  . COPD mixed type ([place]) 07/24/2007   . BURSITIS 07/24/2007    Hilda Blades, PT, DPT, LAT, ATC 01/23/20  12:14 PM Phone: [phone] Fax: [facility] [address] [place], [place], [address] Phone: [phone]   Fax:  [phone]  Name: [name] MRN: [id number] Date of Birth: 09-27-1934

## 2020-01-28 ENCOUNTER — Encounter: Payer: Self-pay | Admitting: Physical Therapy

## 2020-01-28 ENCOUNTER — Encounter: Payer: Self-pay | Admitting: Internal Medicine

## 2020-01-28 ENCOUNTER — Ambulatory Visit: Payer: Medicare Other | Admitting: Physical Therapy

## 2020-01-28 ENCOUNTER — Other Ambulatory Visit: Payer: Self-pay

## 2020-01-28 DIAGNOSIS — R2689 Other abnormalities of gait and mobility: Secondary | ICD-10-CM

## 2020-01-28 DIAGNOSIS — M545 Low back pain, unspecified: Secondary | ICD-10-CM

## 2020-01-28 DIAGNOSIS — M6281 Muscle weakness (generalized): Secondary | ICD-10-CM

## 2020-01-28 DIAGNOSIS — G8929 Other chronic pain: Secondary | ICD-10-CM

## 2020-01-28 NOTE — Patient Instructions (Signed)
Access Code: [email or web address] URL: [email or web address] Date: 01/28/2020 Prepared by: Hilda Blades  Exercises Supine Piriformis Stretch with Foot on Ground - 2 x daily - 7 x weekly - 2 reps - 30 seconds hold Hooklying Clamshell with Resistance - 1-2 x daily - 7 x weekly - 2 sets - 10 reps Supine Bridge with Resistance Band - 1-2 x daily - 7 x weekly - 2 sets - 5-10 reps Supine Active Straight Leg Raise - 1-2 x daily - 7 x weekly - 2 sets - 5-10 reps Seated Hamstring Stretch - 2 x daily - 7 x weekly - 2 reps - 20 seconds hold Sit to Stand with Hands on Knees - 1-2 x daily - 7 x weekly - 3 sets - 5 reps Standing March with Counter Support - 1-2 x daily - 7 x weekly - 2 sets - 10 reps Heel rises with counter support - 1-2 x daily - 7 x weekly - 2 sets - 10 reps Standing Tandem Balance with Counter Support - 1-2 x daily - 7 x weekly - 3 reps - 20 seconds hold

## 2020-01-28 NOTE — Therapy (Signed)
[place], [place], [address] Phone: [phone]   Fax:  [phone]  Physical Therapy Treatment  Patient Details  Name: [name] MRN: [id number] Date of Birth: January 19, 1935 Referring Provider (PT): Gayland Curry, DO   Encounter Date: 01/28/2020   PT End of Session - 01/28/20 1210    Visit Number 3    Number of Visits 16    Date for PT Re-Evaluation 03/03/20    Authorization Type MEDICARE PART A AND B    Progress Note Due on Visit 10    PT Start Time 1210    PT Stop Time 1255    PT Time Calculation (min) 45 min    Activity Tolerance Patient tolerated treatment well    Behavior During Therapy [facility] for tasks assessed/performed           Past Medical History:  Diagnosis Date  . Allergic rhinitis, cause unspecified   . Arthritis   . COPD (chronic obstructive pulmonary disease) ([place])   . Dyspnea   . Dysrhythmia    "1 episode of tachycardia in 1980's, been on it ever since"  . Emphysema of lung ([place])   . Hypertension   . Left inguinal hernia 02/07/2019  . Low hemoglobin    low level  . Neuromuscular disorder ([place])   . Neuropathy    feet  . Pneumonia    History of, last Oct 2019  . Pulmonary nodule     Past Surgical History:  Procedure Laterality Date  . APPENDECTOMY  1943  . CARDIAC CATHETERIZATION  2003   performed at [facility], Dr. Fransico Him  . CATARACT EXTRACTION  2008   Dr. Darleen Crocker  . CATARACT EXTRACTION, BILATERAL    . EYE SURGERY    . INGUINAL HERNIA REPAIR Left 02/07/2019   Procedure: OPEN REPAIR LEFT INGUINAL HERNIA WITH MESH;  Surgeon: Fanny Skates, MD;  Location: [place];  Service: General;  Laterality: Left;  SPINAL AND TAP BLOCK ANESTHESIA  . ROTATOR CUFF REPAIR  2002   Dr. Kathryne Hitch  . TONSILLECTOMY  1945  . VASECTOMY      There were no vitals filed for this visit.   Subjective Assessment - 01/28/20 1210    Subjective Patient reports he is doing well. He  does note that some of the exercises aggravate his back at home.    Patient Stated Goals Totally get off the walker, improve with using cane or transition off the cane, get more strength and improve balance    Currently in Pain? No/denies                             [facility] Adult PT Treatment/Exercise - 01/28/20 0001      Neuro Re-ed    Neuro Re-ed Details  Full tandem 2x20 sec      Exercises   Exercises Lumbar      Lumbar Exercises: Stretches   Passive Hamstring Stretch 30 seconds    Passive Hamstring Stretch Limitations seated edge of table    Single Knee to Chest Stretch 30 seconds    Single Knee to Chest Stretch Limitations supine    Lower Trunk Rotation 5 reps;10 seconds    Piriformis Stretch 30 seconds    Piriformis Stretch Limitations supine      Lumbar Exercises: Aerobic   Nustep L3 x 5 min (LE only)      Lumbar Exercises: Standing  Heel Raises 10 reps   2 sets   Other Standing Lumbar Exercises Standing marching 2x10      Lumbar Exercises: Seated   Sit to Stand 5 reps   3 sets   Sit to Stand Limitations hands on thighs for support      Lumbar Exercises: Supine   Clam 10 reps   2 sets   Clam Limitations yellow    Bridge 5 reps   2 sets   Bridge Limitations yellow band around knees    Straight Leg Raise 5 reps   2 sets                 PT Education - 01/28/20 1210    Education Details HEP    Person(s) Educated Patient    Methods Explanation;Demonstration;Verbal cues;Handout    Comprehension Verbalized understanding;Returned demonstration;Verbal cues required;Need further instruction            PT Short Term Goals - 01/07/20 1332      PT SHORT TERM GOAL #1   Title Patient will be I with initial HEP to progress in PT    Time 4    Period Weeks    Status New    Target Date 02/04/20      PT SHORT TERM GOAL #2   Title Patient will exhibit improved TUG to </=15 sec to improve mobility and reduce fall risk    Time 4    Period  Weeks    Status New    Target Date 02/04/20      PT SHORT TERM GOAL #3   Title Patient will be able to perform STS without UE support to indicate improved LE strength    Time 4    Period Weeks    Status New    Target Date 02/04/20             PT Long Term Goals - 01/07/20 1337      PT LONG TERM GOAL #1   Title Patient will be I with final HEP to maintain progress from PT    Time 8    Period Weeks    Status New    Target Date 03/03/20      PT LONG TERM GOAL #2   Title Patient will exhibit improved TUG to < 13.5 sec to reduce fall risk    Time 8    Period Weeks    Status New    Target Date 03/03/20      PT LONG TERM GOAL #3   Title Patient will demonstrate improved strength and fall risk by performing 5xSTS in </= 14 sec without using UE support    Time 8    Period Weeks    Status New    Target Date 03/03/20      PT LONG TERM GOAL #4   Title Patient will report improved functional level to </= 46% limitation on FOTO    Time 8    Period Weeks    Status New    Target Date 03/03/20      PT LONG TERM GOAL #5   Title Patient will report </= 1/10 pain level with standing 30 minutes    Time 8    Period Weeks    Status New    Target Date 03/03/20                 Plan - 01/28/20 1211    Clinical Impression Statement Patient tolerated therapy well with no adverse  effects. Continued strengthening progression this visit with good tolerance. Patient did report increase in lower back pain with certain exercises so his HEP was adjusted to continue strengthening but place less stress on his lower back. He would benefit from continued skilled PT to progress strength and balance so he can improve walking ability with LRAD and reduce fall risk.    PT Treatment/Interventions ADLs/Self Care Home Management;Cryotherapy;Electrical Stimulation;Moist Heat;Neuromuscular re-education;DME Instruction;Balance training;Therapeutic exercise;Therapeutic activities;Functional mobility  training;Stair training;Gait training;Patient/family education;Manual techniques;Energy conservation;Dry needling;Passive range of motion;Joint Manipulations;Spinal Manipulations    PT Next Visit Plan Assess HEP and progress PRN, initiate balance training, continue progression of general BLE and core strengthening, hip flexibility    PT Home Exercise Plan [id number]: supine piriformis stretch, seated hamstring stretch, supine clam with yellow, bridge with yellow, SLR, standing marching, heel raises, sit<>stand, tandem balance    Consulted and Agree with Plan of Care Patient           Patient will benefit from skilled therapeutic intervention in order to improve the following deficits and impairments:  Difficulty walking, Decreased endurance, Pain, Decreased activity tolerance, Decreased balance, Postural dysfunction, Decreased strength  Visit Diagnosis: Chronic bilateral low back pain without sciatica  Muscle weakness (generalized)  Other abnormalities of gait and mobility     Problem List Patient Active Problem List   Diagnosis Date Noted  . Benign prostatic hyperplasia with nocturia 09/20/2019  . Closed fracture of first lumbar vertebra ([place]) 09/20/2019  . Idiopathic progressive neuropathy 09/20/2019  . Other fatigue 09/20/2019  . Venous insufficiency of both lower extremities 09/20/2019  . Anemia 07/25/2019  . Left inguinal hernia 02/07/2019  . Left leg pain 05/18/2018  . Pneumonia 05/18/2018  . Chronic respiratory failure with hypoxia ([place]) 01/12/2015  . COPD with acute exacerbation ([place]) 01/21/2013  . Hyperlipidemia 12/01/2011  . Hypertension 12/01/2011  . CAD (coronary artery disease) 05/23/2011  . Lung nodule 09/16/2008  . ALLERGIC RHINITIS 07/24/2007  . COPD mixed type ([place]) 07/24/2007  . BURSITIS 07/24/2007    Hilda Blades, PT, DPT, LAT, ATC 01/28/20  1:01 PM Phone: [phone] Fax: [place] [facility] [address] [place], [place], [address] Phone: [phone]   Fax:  [phone]  Name: SHERRY ROGUS MRN: [id number] Date of Birth: Jun 20, 1935

## 2020-01-30 ENCOUNTER — Other Ambulatory Visit: Payer: Self-pay

## 2020-01-30 ENCOUNTER — Encounter: Payer: Self-pay | Admitting: Physical Therapy

## 2020-01-30 ENCOUNTER — Ambulatory Visit: Payer: Medicare Other | Admitting: Physical Therapy

## 2020-01-30 DIAGNOSIS — G8929 Other chronic pain: Secondary | ICD-10-CM

## 2020-01-30 DIAGNOSIS — M545 Low back pain, unspecified: Secondary | ICD-10-CM

## 2020-01-30 DIAGNOSIS — R2689 Other abnormalities of gait and mobility: Secondary | ICD-10-CM

## 2020-01-30 DIAGNOSIS — M6281 Muscle weakness (generalized): Secondary | ICD-10-CM

## 2020-01-30 NOTE — Therapy (Signed)
[place], [place], [address] Phone: [phone]   Fax:  [phone]  Physical Therapy Treatment  Patient Details  Name: [name] MRN: [id number] Date of Birth: 09-May-1935 Referring Provider (PT): Gayland Curry, DO   Encounter Date: 01/30/2020   PT End of Session - 01/30/20 1142    Visit Number 4    Number of Visits 16    Date for PT Re-Evaluation 03/03/20    Authorization Type MEDICARE PART A AND B    Progress Note Due on Visit 10    PT Start Time 1130    PT Stop Time 1215    PT Time Calculation (min) 45 min    Activity Tolerance Patient tolerated treatment well    Behavior During Therapy [facility] for tasks assessed/performed           Past Medical History:  Diagnosis Date  . Allergic rhinitis, cause unspecified   . Arthritis   . COPD (chronic obstructive pulmonary disease) ([place])   . Dyspnea   . Dysrhythmia    "1 episode of tachycardia in 1980's, been on it ever since"  . Emphysema of lung ([place])   . Hypertension   . Left inguinal hernia 02/07/2019  . Low hemoglobin    low level  . Neuromuscular disorder ([place])   . Neuropathy    feet  . Pneumonia    History of, last Oct 2019  . Pulmonary nodule     Past Surgical History:  Procedure Laterality Date  . APPENDECTOMY  1943  . CARDIAC CATHETERIZATION  2003   performed at [facility], Dr. Fransico Him  . CATARACT EXTRACTION  2008   Dr. Darleen Crocker  . CATARACT EXTRACTION, BILATERAL    . EYE SURGERY    . INGUINAL HERNIA REPAIR Left 02/07/2019   Procedure: OPEN REPAIR LEFT INGUINAL HERNIA WITH MESH;  Surgeon: Fanny Skates, MD;  Location: [place];  Service: General;  Laterality: Left;  SPINAL AND TAP BLOCK ANESTHESIA  . ROTATOR CUFF REPAIR  2002   Dr. Kathryne Hitch  . TONSILLECTOMY  1945  . VASECTOMY      There were no vitals filed for this visit.   Subjective Assessment - 01/30/20 1139    Subjective Patient reports he is doing well, his  back felt much better following last visit    Patient Stated Goals Totally get off the walker, improve with using cane or transition off the cane, get more strength and improve balance    Currently in Pain? No/denies              [facility] PT Assessment - 01/30/20 0001      Sit to Stand   Comments Patient able to perform STS without use of UE for support and with good control                         [facility] Adult PT Treatment/Exercise - 01/30/20 0001      Neuro Re-ed    Neuro Re-ed Details  Full tandem 2x20 sec      Exercises   Exercises Lumbar      Lumbar Exercises: Stretches   Passive Hamstring Stretch 30 seconds    Passive Hamstring Stretch Limitations seated edge of table    Single Knee to Chest Stretch 30 seconds    Single Knee to Chest Stretch Limitations supine    Lower Trunk Rotation 5 reps;10 seconds    Hip Flexor  Stretch 30 seconds    Hip Flexor Stretch Limitations supine edge of table    Piriformis Stretch 30 seconds    Piriformis Stretch Limitations supine    Gastroc Stretch 30 seconds    Gastroc Stretch Limitations slant board      Lumbar Exercises: Aerobic   Nustep L1 x 5 min (LE only)      Lumbar Exercises: Standing   Heel Raises 10 reps    Other Standing Lumbar Exercises Standing marching x10      Lumbar Exercises: Seated   Sit to Stand 5 reps   3 sets   Sit to Stand Limitations first 2 reps without use of UE, last 3 reps with hands on thighs      Lumbar Exercises: Supine   Clam 10 reps   2 sets   Clam Limitations red    Bridge 10 reps   2 sets   Straight Leg Raise 10 reps   2 sets                 PT Education - 01/30/20 1139    Education Details HEP    Person(s) Educated Patient    Methods Explanation;Demonstration;Verbal cues    Comprehension Verbalized understanding;Returned demonstration;Verbal cues required;Need further instruction            PT Short Term Goals - 01/07/20 1332      PT SHORT TERM GOAL #1   Title  Patient will be I with initial HEP to progress in PT    Time 4    Period Weeks    Status New    Target Date 02/04/20      PT SHORT TERM GOAL #2   Title Patient will exhibit improved TUG to </=15 sec to improve mobility and reduce fall risk    Time 4    Period Weeks    Status New    Target Date 02/04/20      PT SHORT TERM GOAL #3   Title Patient will be able to perform STS without UE support to indicate improved LE strength    Time 4    Period Weeks    Status New    Target Date 02/04/20             PT Long Term Goals - 01/07/20 1337      PT LONG TERM GOAL #1   Title Patient will be I with final HEP to maintain progress from PT    Time 8    Period Weeks    Status New    Target Date 03/03/20      PT LONG TERM GOAL #2   Title Patient will exhibit improved TUG to < 13.5 sec to reduce fall risk    Time 8    Period Weeks    Status New    Target Date 03/03/20      PT LONG TERM GOAL #3   Title Patient will demonstrate improved strength and fall risk by performing 5xSTS in </= 14 sec without using UE support    Time 8    Period Weeks    Status New    Target Date 03/03/20      PT LONG TERM GOAL #4   Title Patient will report improved functional level to </= 46% limitation on FOTO    Time 8    Period Weeks    Status New    Target Date 03/03/20      PT LONG TERM GOAL #5  Title Patient will report </= 1/10 pain level with standing 30 minutes    Time 8    Period Weeks    Status New    Target Date 03/03/20                 Plan - 01/30/20 1143    Clinical Impression Statement Patient tolerated therapy well with no adverse effects. He is progressing with his exercises and was able to perform more repetitions of exercises and add resistance with good tolerance and without increased pain. He continues to report difficulty with exercises and fatigue following therapy. Frequent rest breaks required due to shortness of breath. He would benefit from continued skilled  PT to progress strength and balance so he can improve walking ability with LRAD and reduce fall risk.    PT Treatment/Interventions ADLs/Self Care Home Management;Cryotherapy;Electrical Stimulation;Moist Heat;Neuromuscular re-education;DME Instruction;Balance training;Therapeutic exercise;Therapeutic activities;Functional mobility training;Stair training;Gait training;Patient/family education;Manual techniques;Energy conservation;Dry needling;Passive range of motion;Joint Manipulations;Spinal Manipulations    PT Next Visit Plan Assess HEP and progress PRN, initiate balance training, continue progression of general BLE and core strengthening, hip flexibility    PT Home Exercise Plan [id number]: supine piriformis stretch, seated hamstring stretch, supine clam with yellow (given red), bridge with yellow, SLR, standing marching, heel raises, sit<>stand, tandem balance    Consulted and Agree with Plan of Care Patient           Patient will benefit from skilled therapeutic intervention in order to improve the following deficits and impairments:  Difficulty walking, Decreased endurance, Pain, Decreased activity tolerance, Decreased balance, Postural dysfunction, Decreased strength  Visit Diagnosis: Chronic bilateral low back pain without sciatica  Muscle weakness (generalized)  Other abnormalities of gait and mobility     Problem List Patient Active Problem List   Diagnosis Date Noted  . Benign prostatic hyperplasia with nocturia 09/20/2019  . Closed fracture of first lumbar vertebra ([place]) 09/20/2019  . Idiopathic progressive neuropathy 09/20/2019  . Other fatigue 09/20/2019  . Venous insufficiency of both lower extremities 09/20/2019  . Anemia 07/25/2019  . Left inguinal hernia 02/07/2019  . Left leg pain 05/18/2018  . Pneumonia 05/18/2018  . Chronic respiratory failure with hypoxia ([place]) 01/12/2015  . COPD with acute exacerbation ([place]) 01/21/2013  . Hyperlipidemia 12/01/2011  .  Hypertension 12/01/2011  . CAD (coronary artery disease) 05/23/2011  . Lung nodule 09/16/2008  . ALLERGIC RHINITIS 07/24/2007  . COPD mixed type ([place]) 07/24/2007  . BURSITIS 07/24/2007    Hilda Blades, PT, DPT, LAT, ATC 01/30/20  12:15 PM Phone: [phone] Fax: [place] [facility] [address] [place], [place], [address] Phone: [phone]   Fax:  [phone]  Name: [name] MRN: [id number] Date of Birth: 09-17-1934

## 2020-02-04 ENCOUNTER — Other Ambulatory Visit: Payer: Self-pay

## 2020-02-04 ENCOUNTER — Ambulatory Visit: Payer: Medicare Other | Admitting: Physical Therapy

## 2020-02-04 ENCOUNTER — Encounter: Payer: Self-pay | Admitting: Physical Therapy

## 2020-02-04 DIAGNOSIS — M545 Low back pain, unspecified: Secondary | ICD-10-CM

## 2020-02-04 DIAGNOSIS — R2681 Unsteadiness on feet: Secondary | ICD-10-CM

## 2020-02-04 DIAGNOSIS — G8929 Other chronic pain: Secondary | ICD-10-CM

## 2020-02-04 DIAGNOSIS — R262 Difficulty in walking, not elsewhere classified: Secondary | ICD-10-CM

## 2020-02-04 DIAGNOSIS — R2689 Other abnormalities of gait and mobility: Secondary | ICD-10-CM

## 2020-02-04 DIAGNOSIS — M6281 Muscle weakness (generalized): Secondary | ICD-10-CM

## 2020-02-04 NOTE — Therapy (Signed)
[place], [place], [address] Phone: [phone]   Fax:  [phone]  Physical Therapy Treatment  Patient Details  Name: [name] MRN: [id number] Date of Birth: Jul 30, 1935 Referring Provider (PT): Gayland Curry, DO   Encounter Date: 02/04/2020   PT End of Session - 02/04/20 1320    Visit Number 5    Number of Visits 16    Date for PT Re-Evaluation 03/03/20    Authorization Type MEDICARE PART A AND B    Progress Note Due on Visit 10    PT Start Time 1315    PT Stop Time 1355    PT Time Calculation (min) 40 min           Past Medical History:  Diagnosis Date  . Allergic rhinitis, cause unspecified   . Arthritis   . COPD (chronic obstructive pulmonary disease) ([place])   . Dyspnea   . Dysrhythmia    "1 episode of tachycardia in 1980's, been on it ever since"  . Emphysema of lung ([place])   . Hypertension   . Left inguinal hernia 02/07/2019  . Low hemoglobin    low level  . Neuromuscular disorder ([place])   . Neuropathy    feet  . Pneumonia    History of, last Oct 2019  . Pulmonary nodule     Past Surgical History:  Procedure Laterality Date  . APPENDECTOMY  1943  . CARDIAC CATHETERIZATION  2003   performed at [facility], Dr. Fransico Him  . CATARACT EXTRACTION  2008   Dr. Darleen Crocker  . CATARACT EXTRACTION, BILATERAL    . EYE SURGERY    . INGUINAL HERNIA REPAIR Left 02/07/2019   Procedure: OPEN REPAIR LEFT INGUINAL HERNIA WITH MESH;  Surgeon: Fanny Skates, MD;  Location: [place];  Service: General;  Laterality: Left;  SPINAL AND TAP BLOCK ANESTHESIA  . ROTATOR CUFF REPAIR  2002   Dr. Kathryne Hitch  . TONSILLECTOMY  1945  . VASECTOMY      There were no vitals filed for this visit.   Subjective Assessment - 02/04/20 1319    Subjective Not feeling great today.Did not sleep well, not pain related. Pain no different today than any other day.    Pertinent History Former smoker on  supplemental O2 2 - 4L, followed for COPD/emphysema, pulmonary nodules, hypoxic respiratory failure, complicated by CAD, Anemia    Limitations Standing;Walking;House hold activities;Lifting    Currently in Pain? No/denies                             [facility] Adult PT Treatment/Exercise - 02/04/20 0001      Lumbar Exercises: Stretches   Passive Hamstring Stretch 5 reps;10 seconds    Passive Hamstring Stretch Limitations seated edge of table    Single Knee to Chest Stretch 2 reps;30 seconds    Single Knee to Chest Stretch Limitations supine    Lower Trunk Rotation 5 reps;10 seconds    Piriformis Stretch 2 reps;30 seconds    Piriformis Stretch Limitations supine    Gastroc Stretch 30 seconds    Gastroc Stretch Limitations slant board      Lumbar Exercises: Aerobic   Nustep L1 x 5 min (LE only)      Lumbar Exercises: Standing   Heel Raises 10 reps    Heel Raises Limitations 2 sets      Lumbar Exercises: Seated   Sit to  Stand 5 reps   2sets, declined a 3rd set   Sit to Stand Limitations UE to raise, intermittent UE to lower, cues for control       Lumbar Exercises: Supine   Clam 10 reps   2 sets   Clam Limitations red    Bridge 10 reps   2 sets   Bridge Limitations red band around knees     Straight Leg Raise 5 reps   2 sets   Straight Leg Raises Limitations declined reps of 10                    PT Short Term Goals - 01/07/20 1332      PT SHORT TERM GOAL #1   Title Patient will be I with initial HEP to progress in PT    Time 4    Period Weeks    Status New    Target Date 02/04/20      PT SHORT TERM GOAL #2   Title Patient will exhibit improved TUG to </=15 sec to improve mobility and reduce fall risk    Time 4    Period Weeks    Status New    Target Date 02/04/20      PT SHORT TERM GOAL #3   Title Patient will be able to perform STS without UE support to indicate improved LE strength    Time 4    Period Weeks    Status New    Target  Date 02/04/20             PT Long Term Goals - 01/07/20 1337      PT LONG TERM GOAL #1   Title Patient will be I with final HEP to maintain progress from PT    Time 8    Period Weeks    Status New    Target Date 03/03/20      PT LONG TERM GOAL #2   Title Patient will exhibit improved TUG to < 13.5 sec to reduce fall risk    Time 8    Period Weeks    Status New    Target Date 03/03/20      PT LONG TERM GOAL #3   Title Patient will demonstrate improved strength and fall risk by performing 5xSTS in </= 14 sec without using UE support    Time 8    Period Weeks    Status New    Target Date 03/03/20      PT LONG TERM GOAL #4   Title Patient will report improved functional level to </= 46% limitation on FOTO    Time 8    Period Weeks    Status New    Target Date 03/03/20      PT LONG TERM GOAL #5   Title Patient will report </= 1/10 pain level with standing 30 minutes    Time 8    Period Weeks    Status New    Target Date 03/03/20                 Plan - 02/04/20 1403    Clinical Impression Statement Pt arrives reporting fatigue due to not sleeping well last night. He declined to perform addiitonal reps of some exercises due to fatigue and SOB.    Comorbidities Former smoker on supplemental O2 2 - 4L, followed for COPD/emphysema, pulmonary nodules, hypoxic respiratory failure, complicated by CAD, Anemia    PT Next Visit Plan Assess HEP  and progress PRN, initiate balance training, continue progression of general BLE and core strengthening, hip flexibility    PT Home Exercise Plan [id number]: supine piriformis stretch, seated hamstring stretch, supine clam with yellow (given red), bridge with yellow, SLR, standing marching, heel raises, sit<>stand, tandem balance           Patient will benefit from skilled therapeutic intervention in order to improve the following deficits and impairments:     Visit Diagnosis: Chronic bilateral low back pain without  sciatica  Muscle weakness (generalized)  Other abnormalities of gait and mobility  Acute midline low back pain without sciatica  Difficulty in walking, not elsewhere classified  Unsteadiness on feet     Problem List Patient Active Problem List   Diagnosis Date Noted  . Benign prostatic hyperplasia with nocturia 09/20/2019  . Closed fracture of first lumbar vertebra ([place]) 09/20/2019  . Idiopathic progressive neuropathy 09/20/2019  . Other fatigue 09/20/2019  . Venous insufficiency of both lower extremities 09/20/2019  . Anemia 07/25/2019  . Left inguinal hernia 02/07/2019  . Left leg pain 05/18/2018  . Pneumonia 05/18/2018  . Chronic respiratory failure with hypoxia ([place]) 01/12/2015  . COPD with acute exacerbation ([place]) 01/21/2013  . Hyperlipidemia 12/01/2011  . Hypertension 12/01/2011  . CAD (coronary artery disease) 05/23/2011  . Lung nodule 09/16/2008  . ALLERGIC RHINITIS 07/24/2007  . COPD mixed type ([place]) 07/24/2007  . BURSITIS 07/24/2007    Dorene Ar, PTA 02/04/2020, 2:07 PM  [facility] [address] [place], [place], [address] Phone: [phone]   Fax:  [phone]  Name: KO BARDON MRN: [id number] Date of Birth: [birth year]

## 2020-02-06 ENCOUNTER — Ambulatory Visit: Payer: Medicare Other | Admitting: Physical Therapy

## 2020-02-06 ENCOUNTER — Other Ambulatory Visit: Payer: Self-pay

## 2020-02-06 ENCOUNTER — Encounter: Payer: Self-pay | Admitting: Physical Therapy

## 2020-02-06 DIAGNOSIS — R2689 Other abnormalities of gait and mobility: Secondary | ICD-10-CM

## 2020-02-06 DIAGNOSIS — M545 Low back pain, unspecified: Secondary | ICD-10-CM

## 2020-02-06 DIAGNOSIS — M6281 Muscle weakness (generalized): Secondary | ICD-10-CM

## 2020-02-06 DIAGNOSIS — G8929 Other chronic pain: Secondary | ICD-10-CM

## 2020-02-06 NOTE — Therapy (Signed)
[place], [place], [address] Phone: [phone]   Fax:  [phone]  Physical Therapy Treatment  Patient Details  Name: [name] MRN: [id number] Date of Birth: [birth year] Referring Provider (PT): Gayland Curry, DO   Encounter Date: 02/06/2020   PT End of Session - 02/06/20 1135    Visit Number 6    Number of Visits 16    Date for PT Re-Evaluation 03/03/20    Authorization Type MEDICARE PART A AND B    Progress Note Due on Visit 10    PT Start Time 1130    PT Stop Time 1210    PT Time Calculation (min) 40 min    Activity Tolerance Patient tolerated treatment well    Behavior During Therapy [facility] for tasks assessed/performed           Past Medical History:  Diagnosis Date  . Allergic rhinitis, cause unspecified   . Arthritis   . COPD (chronic obstructive pulmonary disease) ([place])   . Dyspnea   . Dysrhythmia    "1 episode of tachycardia in 1980's, been on it ever since"  . Emphysema of lung ([place])   . Hypertension   . Left inguinal hernia 02/07/2019  . Low hemoglobin    low level  . Neuromuscular disorder ([place])   . Neuropathy    feet  . Pneumonia    History of, last Oct 2019  . Pulmonary nodule     Past Surgical History:  Procedure Laterality Date  . APPENDECTOMY  1943  . CARDIAC CATHETERIZATION  2003   performed at [facility], Dr. Fransico Him  . CATARACT EXTRACTION  2008   Dr. Darleen Crocker  . CATARACT EXTRACTION, BILATERAL    . EYE SURGERY    . INGUINAL HERNIA REPAIR Left 02/07/2019   Procedure: OPEN REPAIR LEFT INGUINAL HERNIA WITH MESH;  Surgeon: Fanny Skates, MD;  Location: [place];  Service: General;  Laterality: Left;  SPINAL AND TAP BLOCK ANESTHESIA  . ROTATOR CUFF REPAIR  2002   Dr. Kathryne Hitch  . TONSILLECTOMY  1945  . VASECTOMY      There were no vitals filed for this visit.   Subjective Assessment - 02/06/20 1128    Subjective Patient reports he is doing better  today but still not feeling real well. He is still having a lot of trouble with not having any energy.    Patient Stated Goals Totally get off the walker, improve with using cane or transition off the cane, get more strength and improve balance    Currently in Pain? No/denies                             [facility] Adult PT Treatment/Exercise - 02/06/20 0001      Exercises   Exercises Lumbar      Lumbar Exercises: Stretches   Passive Hamstring Stretch 30 seconds    Passive Hamstring Stretch Limitations supine    Single Knee to Chest Stretch 30 seconds    Single Knee to Chest Stretch Limitations supine    Lower Trunk Rotation 5 reps;10 seconds    Piriformis Stretch 30 seconds    Piriformis Stretch Limitations supine    Figure 4 Stretch 30 seconds    Figure 4 Stretch Limitations supine    Gastroc Stretch 30 seconds    Gastroc Stretch Limitations slant board      Lumbar Exercises: Standing  Heel Raises 10 reps      Lumbar Exercises: Seated   Sit to Stand 5 reps   only able to perform 2 sets   Sit to Stand Limitations required use of BUE on thighs for support      Lumbar Exercises: Supine   Clam 10 reps   2 sets   Clam Limitations green    Bridge 10 reps   2 sets   Straight Leg Raise 10 reps   2 sets                 PT Education - 02/06/20 1134    Education Details HEP    Person(s) Educated Patient    Methods Explanation;Demonstration;Verbal cues    Comprehension Verbalized understanding;Returned demonstration;Verbal cues required;Need further instruction            PT Short Term Goals - 02/06/20 1213      PT SHORT TERM GOAL #1   Title Patient will be I with initial HEP to progress in PT    Time 4    Period Weeks    Status On-going    Target Date 02/04/20      PT SHORT TERM GOAL #2   Title Patient will exhibit improved TUG to </=15 sec to improve mobility and reduce fall risk    Baseline Did not assess due to patient not feeling well, will  reassess next visit    Time 4    Period Weeks    Status On-going    Target Date 02/04/20      PT SHORT TERM GOAL #3   Title Patient will be able to perform STS without UE support to indicate improved LE strength    Baseline Did not assess due to patient not feeling well, will reassess next visit    Time 4    Period Weeks    Status On-going    Target Date 02/04/20             PT Long Term Goals - 01/07/20 1337      PT LONG TERM GOAL #1   Title Patient will be I with final HEP to maintain progress from PT    Time 8    Period Weeks    Status New    Target Date 03/03/20      PT LONG TERM GOAL #2   Title Patient will exhibit improved TUG to < 13.5 sec to reduce fall risk    Time 8    Period Weeks    Status New    Target Date 03/03/20      PT LONG TERM GOAL #3   Title Patient will demonstrate improved strength and fall risk by performing 5xSTS in </= 14 sec without using UE support    Time 8    Period Weeks    Status New    Target Date 03/03/20      PT LONG TERM GOAL #4   Title Patient will report improved functional level to </= 46% limitation on FOTO    Time 8    Period Weeks    Status New    Target Date 03/03/20      PT LONG TERM GOAL #5   Title Patient will report </= 1/10 pain level with standing 30 minutes    Time 8    Period Weeks    Status New    Target Date 03/03/20  Plan - 02/06/20 1210    Clinical Impression Statement Patient tolerated therapy well with no adverse effects. He continued to report not feeling well due to general endurance and fatigue so required frequent rest breaks during exercise. He was encouraged to continue progressing sets and reps at home, performing as manu as he could if he couldt achieve established goal. He would benefit from continued skilled PT to progress his strength and mobility in order to improve walking with LRAD.    PT Treatment/Interventions ADLs/Self Care Home Management;Cryotherapy;Electrical  Stimulation;Moist Heat;Neuromuscular re-education;DME Instruction;Balance training;Therapeutic exercise;Therapeutic activities;Functional mobility training;Stair training;Gait training;Patient/family education;Manual techniques;Energy conservation;Dry needling;Passive range of motion;Joint Manipulations;Spinal Manipulations    PT Next Visit Plan Assess HEP and progress PRN, initiate balance training, continue progression of general BLE and core strengthening, hip flexibility    PT Home Exercise Plan [id number]: supine piriformis stretch, seated hamstring stretch, supine clam with yellow (given red), bridge with yellow, SLR, standing marching, heel raises, sit<>stand, tandem balance    Consulted and Agree with Plan of Care Patient           Patient will benefit from skilled therapeutic intervention in order to improve the following deficits and impairments:  Difficulty walking, Decreased endurance, Pain, Decreased activity tolerance, Decreased balance, Postural dysfunction, Decreased strength  Visit Diagnosis: Chronic bilateral low back pain without sciatica  Muscle weakness (generalized)  Other abnormalities of gait and mobility     Problem List Patient Active Problem List   Diagnosis Date Noted  . Benign prostatic hyperplasia with nocturia 09/20/2019  . Closed fracture of first lumbar vertebra ([place]) 09/20/2019  . Idiopathic progressive neuropathy 09/20/2019  . Other fatigue 09/20/2019  . Venous insufficiency of both lower extremities 09/20/2019  . Anemia 07/25/2019  . Left inguinal hernia 02/07/2019  . Left leg pain 05/18/2018  . Pneumonia 05/18/2018  . Chronic respiratory failure with hypoxia ([place]) 01/12/2015  . COPD with acute exacerbation ([place]) 01/21/2013  . Hyperlipidemia 12/01/2011  . Hypertension 12/01/2011  . CAD (coronary artery disease) 05/23/2011  . Lung nodule 09/16/2008  . ALLERGIC RHINITIS 07/24/2007  . COPD mixed type ([place]) 07/24/2007  . BURSITIS 07/24/2007     Hilda Blades, PT, DPT, LAT, ATC 02/06/20  12:17 PM Phone: [phone] Fax: [place] [facility] [address] [place], [place], [address] Phone: [phone]   Fax:  [phone]  Name: [name] MRN: [id number] Date of Birth: [birth year]

## 2020-02-11 ENCOUNTER — Ambulatory Visit: Payer: Medicare Other | Admitting: Physical Therapy

## 2020-02-11 ENCOUNTER — Other Ambulatory Visit: Payer: Self-pay

## 2020-02-11 DIAGNOSIS — M6281 Muscle weakness (generalized): Secondary | ICD-10-CM

## 2020-02-11 DIAGNOSIS — R2689 Other abnormalities of gait and mobility: Secondary | ICD-10-CM

## 2020-02-11 DIAGNOSIS — R262 Difficulty in walking, not elsewhere classified: Secondary | ICD-10-CM

## 2020-02-11 DIAGNOSIS — G8929 Other chronic pain: Secondary | ICD-10-CM

## 2020-02-11 DIAGNOSIS — M545 Low back pain, unspecified: Secondary | ICD-10-CM

## 2020-02-11 DIAGNOSIS — R2681 Unsteadiness on feet: Secondary | ICD-10-CM

## 2020-02-11 NOTE — Therapy (Signed)
[place], [place], [address] Phone: [phone]   Fax:  [phone]  Physical Therapy Treatment  Patient Details  Name: [name] MRN: [id number] Date of Birth: 10-09-1934 Referring Provider (PT): Gayland Curry, DO   Encounter Date: 02/11/2020   PT End of Session - 02/11/20 1236    Visit Number 7    Number of Visits 16    Date for PT Re-Evaluation 03/03/20    Authorization Type MEDICARE PART A AND B    Progress Note Due on Visit 10    PT Start Time 1230    PT Stop Time 1315    PT Time Calculation (min) 45 min           Past Medical History:  Diagnosis Date  . Allergic rhinitis, cause unspecified   . Arthritis   . COPD (chronic obstructive pulmonary disease) ([place])   . Dyspnea   . Dysrhythmia    "1 episode of tachycardia in 1980's, been on it ever since"  . Emphysema of lung ([place])   . Hypertension   . Left inguinal hernia 02/07/2019  . Low hemoglobin    low level  . Neuromuscular disorder ([place])   . Neuropathy    feet  . Pneumonia    History of, last Oct 2019  . Pulmonary nodule     Past Surgical History:  Procedure Laterality Date  . APPENDECTOMY  1943  . CARDIAC CATHETERIZATION  2003   performed at [facility], Dr. Fransico Him  . CATARACT EXTRACTION  2008   Dr. Darleen Crocker  . CATARACT EXTRACTION, BILATERAL    . EYE SURGERY    . INGUINAL HERNIA REPAIR Left 02/07/2019   Procedure: OPEN REPAIR LEFT INGUINAL HERNIA WITH MESH;  Surgeon: Fanny Skates, MD;  Location: [place];  Service: General;  Laterality: Left;  SPINAL AND TAP BLOCK ANESTHESIA  . ROTATOR CUFF REPAIR  2002   Dr. Kathryne Hitch  . TONSILLECTOMY  1945  . VASECTOMY      There were no vitals filed for this visit.   Subjective Assessment - 02/11/20 1235    Subjective The usual back pain, not much.    Currently in Pain? No/denies              [facility] PT Assessment - 02/11/20 0001      Timed Up and Go Test    Normal TUG (seconds) 16   with SPC, therapist carrying O2 tank                         [facility] Adult PT Treatment/Exercise - 02/11/20 0001      Lumbar Exercises: Stretches   Lower Trunk Rotation 5 reps;10 seconds    Piriformis Stretch 30 seconds    Piriformis Stretch Limitations supine, PROM     Figure 4 Stretch 30 seconds    Figure 4 Stretch Limitations supine, PROM      Lumbar Exercises: Aerobic   Nustep L1 x 5 min (LE only)      Lumbar Exercises: Seated   Sit to Stand 5 reps   2 sets   Sit to Stand Limitations UE to raise, no UE to lower       Lumbar Exercises: Supine   Clam 10 reps   3 sets   Clam Limitations green    Bridge 10 reps   3 sets   Straight Leg Raise 10 reps   2 sets  PT Short Term Goals - 02/11/20 1247      PT SHORT TERM GOAL #1   Title Patient will be I with initial HEP to progress in PT    Time 4    Period Weeks    Status On-going      PT SHORT TERM GOAL #2   Title Patient will exhibit improved TUG to </=15 sec to improve mobility and reduce fall risk    Baseline 16 sec improved from 17.27    Time 4    Period Weeks    Status On-going      PT SHORT TERM GOAL #3   Title Patient will be able to perform STS without UE support to indicate improved LE strength    Baseline able to perform 1 STS from standard chair without UE after 3 attempts    Time 4    Period Weeks    Status Achieved             PT Long Term Goals - 01/07/20 1337      PT LONG TERM GOAL #1   Title Patient will be I with final HEP to maintain progress from PT    Time 8    Period Weeks    Status New    Target Date 03/03/20      PT LONG TERM GOAL #2   Title Patient will exhibit improved TUG to < 13.5 sec to reduce fall risk    Time 8    Period Weeks    Status New    Target Date 03/03/20      PT LONG TERM GOAL #3   Title Patient will demonstrate improved strength and fall risk by performing 5xSTS in </= 14 sec without using UE  support    Time 8    Period Weeks    Status New    Target Date 03/03/20      PT LONG TERM GOAL #4   Title Patient will report improved functional level to </= 46% limitation on FOTO    Time 8    Period Weeks    Status New    Target Date 03/03/20      PT LONG TERM GOAL #5   Title Patient will report </= 1/10 pain level with standing 30 minutes    Time 8    Period Weeks    Status New    Target Date 03/03/20                 Plan - 02/11/20 1341    Clinical Impression Statement TUG improved from 17.27 to 16 seconds. He is able to rise from standard chair one repetition without UE. STG# 3 met, Progressing toward remaining STGs. He was able to complete additonal sets of therex today.    Comorbidities Former smoker on supplemental O2 2 - 4L, followed for COPD/emphysema, pulmonary nodules, hypoxic respiratory failure, complicated by CAD, Anemia    PT Next Visit Plan Assess HEP and progress PRN, initiate balance training, continue progression of general BLE and core strengthening, hip flexibility    PT Home Exercise Plan [id number]: supine piriformis stretch, seated hamstring stretch, supine clam with yellow (given red), bridge with yellow, SLR, standing marching, heel raises, sit<>stand, tandem balance           Patient will benefit from skilled therapeutic intervention in order to improve the following deficits and impairments:  Difficulty walking, Decreased endurance, Pain, Decreased activity tolerance, Decreased balance, Postural dysfunction, Decreased strength  Visit  Diagnosis: Chronic bilateral low back pain without sciatica  Muscle weakness (generalized)  Other abnormalities of gait and mobility  Acute midline low back pain without sciatica  Difficulty in walking, not elsewhere classified  Unsteadiness on feet     Problem List Patient Active Problem List   Diagnosis Date Noted  . Benign prostatic hyperplasia with nocturia 09/20/2019  . Closed fracture of first  lumbar vertebra ([place]) 09/20/2019  . Idiopathic progressive neuropathy 09/20/2019  . Other fatigue 09/20/2019  . Venous insufficiency of both lower extremities 09/20/2019  . Anemia 07/25/2019  . Left inguinal hernia 02/07/2019  . Left leg pain 05/18/2018  . Pneumonia 05/18/2018  . Chronic respiratory failure with hypoxia ([place]) 01/12/2015  . COPD with acute exacerbation ([place]) 01/21/2013  . Hyperlipidemia 12/01/2011  . Hypertension 12/01/2011  . CAD (coronary artery disease) 05/23/2011  . Lung nodule 09/16/2008  . ALLERGIC RHINITIS 07/24/2007  . COPD mixed type ([place]) 07/24/2007  . BURSITIS 07/24/2007    Dorene Ar, PTA 02/11/2020, 1:44 PM  [place], [place], [address] Phone: [phone]   Fax:  [phone]  Name: [name] MRN: [id number] Date of Birth: [birth year]

## 2020-02-13 ENCOUNTER — Encounter: Payer: Self-pay | Admitting: Physical Therapy

## 2020-02-13 ENCOUNTER — Other Ambulatory Visit: Payer: Self-pay

## 2020-02-13 ENCOUNTER — Ambulatory Visit: Payer: Medicare Other | Admitting: Physical Therapy

## 2020-02-13 DIAGNOSIS — M545 Low back pain, unspecified: Secondary | ICD-10-CM

## 2020-02-13 DIAGNOSIS — M6281 Muscle weakness (generalized): Secondary | ICD-10-CM

## 2020-02-13 DIAGNOSIS — R2689 Other abnormalities of gait and mobility: Secondary | ICD-10-CM

## 2020-02-13 DIAGNOSIS — G8929 Other chronic pain: Secondary | ICD-10-CM

## 2020-02-13 NOTE — Therapy (Signed)
[name], [place], [address] Phone: [phone]   Fax:  [phone]  Physical Therapy Treatment  Patient Details  Name: [name] MRN: [id number] Date of Birth: 03-10-1935 Referring Provider (PT): Gayland Curry, DO   Encounter Date: 02/13/2020   PT End of Session - 02/13/20 1005    Visit Number 8    Number of Visits 16    Date for PT Re-Evaluation 03/03/20    Authorization Type MEDICARE PART A AND B    Progress Note Due on Visit 10    PT Start Time 1000    PT Stop Time 1040    PT Time Calculation (min) 40 min    Activity Tolerance Patient tolerated treatment well    Behavior During Therapy [facility] for tasks assessed/performed           Past Medical History:  Diagnosis Date  . Allergic rhinitis, cause unspecified   . Arthritis   . COPD (chronic obstructive pulmonary disease) ([place])   . Dyspnea   . Dysrhythmia    "1 episode of tachycardia in 1980's, been on it ever since"  . Emphysema of lung ([place])   . Hypertension   . Left inguinal hernia 02/07/2019  . Low hemoglobin    low level  . Neuromuscular disorder ([place])   . Neuropathy    feet  . Pneumonia    History of, last Oct 2019  . Pulmonary nodule     Past Surgical History:  Procedure Laterality Date  . APPENDECTOMY  1943  . CARDIAC CATHETERIZATION  2003   performed at [facility], Dr. Fransico Him  . CATARACT EXTRACTION  2008   Dr. Darleen Crocker  . CATARACT EXTRACTION, BILATERAL    . EYE SURGERY    . INGUINAL HERNIA REPAIR Left 02/07/2019   Procedure: OPEN REPAIR LEFT INGUINAL HERNIA WITH MESH;  Surgeon: Fanny Skates, MD;  Location: [place];  Service: General;  Laterality: Left;  SPINAL AND TAP BLOCK ANESTHESIA  . ROTATOR CUFF REPAIR  2002   Dr. Kathryne Hitch  . TONSILLECTOMY  1945  . VASECTOMY      There were no vitals filed for this visit.   Subjective Assessment - 02/13/20 1003    Subjective Patient reports he is doing well, not  much change since last visit.    Patient Stated Goals Totally get off the walker, improve with using cane or transition off the cane, get more strength and improve balance    Currently in Pain? No/denies              [facility] PT Assessment - 02/13/20 0001      Observation/Other Assessments   Focus on Therapeutic Outcomes (FOTO)  48% limitation                         [facility] Adult PT Treatment/Exercise - 02/13/20 0001      Exercises   Exercises Lumbar      Lumbar Exercises: Stretches   Passive Hamstring Stretch 30 seconds    Passive Hamstring Stretch Limitations supine, PROM     Single Knee to Chest Stretch 30 seconds    Single Knee to Chest Stretch Limitations supine, PROM     Lower Trunk Rotation 3 reps;10 seconds    Piriformis Stretch 30 seconds    Piriformis Stretch Limitations supine, PROM     Figure 4 Stretch 30 seconds    Figure 4 Stretch Limitations supine, PROM  Lumbar Exercises: Aerobic   Nustep L1 x 5 min (LE only)      Lumbar Exercises: Standing   Heel Raises 10 reps   2 sets   Other Standing Lumbar Exercises Standing marching 2x10      Lumbar Exercises: Seated   Long Arc Quad on Chair 2 sets;10 reps    Sit to Stand 5 reps   3 sets   Sit to Stand Limitations able to perform multiple reps without UE assist of each set      Lumbar Exercises: Supine   Clam 10 reps   2 sets   Clam Limitations green    Bridge 10 reps   2 sets   Straight Leg Raise 10 reps   2 sets                 PT Education - 02/13/20 1004    Education Details HEP    Person(s) Educated Patient    Methods Explanation;Demonstration;Verbal cues    Comprehension Verbalized understanding;Returned demonstration;Verbal cues required;Need further instruction            PT Short Term Goals - 02/11/20 1247      PT SHORT TERM GOAL #1   Title Patient will be I with initial HEP to progress in PT    Time 4    Period Weeks    Status On-going      PT SHORT TERM GOAL  #2   Title Patient will exhibit improved TUG to </=15 sec to improve mobility and reduce fall risk    Baseline 16 sec improved from 17.27    Time 4    Period Weeks    Status On-going      PT SHORT TERM GOAL #3   Title Patient will be able to perform STS without UE support to indicate improved LE strength    Baseline able to perform 1 STS from standard chair without UE after 3 attempts    Time 4    Period Weeks    Status Achieved             PT Long Term Goals - 01/07/20 1337      PT LONG TERM GOAL #1   Title Patient will be I with final HEP to maintain progress from PT    Time 8    Period Weeks    Status New    Target Date 03/03/20      PT LONG TERM GOAL #2   Title Patient will exhibit improved TUG to < 13.5 sec to reduce fall risk    Time 8    Period Weeks    Status New    Target Date 03/03/20      PT LONG TERM GOAL #3   Title Patient will demonstrate improved strength and fall risk by performing 5xSTS in </= 14 sec without using UE support    Time 8    Period Weeks    Status New    Target Date 03/03/20      PT LONG TERM GOAL #4   Title Patient will report improved functional level to </= 46% limitation on FOTO    Time 8    Period Weeks    Status New    Target Date 03/03/20      PT LONG TERM GOAL #5   Title Patient will report </= 1/10 pain level with standing 30 minutes    Time 8    Period Weeks    Status New  Target Date 03/03/20                 Plan - 02/13/20 1005    Clinical Impression Statement Patient tolerated therapy well with no adverse effects. Continued strength progression with good tolerance, but patient does continue to demonstrate fatigue more related to shortness of breath. He is improving with sit<>stand and able to perform multiple repetiitons without UE assist. He does show functional improvement on FOTO and state that a lot of his continued limitations are due to his breathing. He would benefit from continued skilled PT to  progress his strength and mobility in order to improve walking with LRAD.    PT Treatment/Interventions ADLs/Self Care Home Management;Cryotherapy;Electrical Stimulation;Moist Heat;Neuromuscular re-education;DME Instruction;Balance training;Therapeutic exercise;Therapeutic activities;Functional mobility training;Stair training;Gait training;Patient/family education;Manual techniques;Energy conservation;Dry needling;Passive range of motion;Joint Manipulations;Spinal Manipulations    PT Next Visit Plan Assess HEP and progress PRN, initiate balance training, continue progression of general BLE and core strengthening, hip flexibility    PT Home Exercise Plan [id number]: supine piriformis stretch, seated hamstring stretch, supine clam with green, bridge with green, SLR, standing marching, heel raises, sit<>stand, tandem balance    Consulted and Agree with Plan of Care Patient           Patient will benefit from skilled therapeutic intervention in order to improve the following deficits and impairments:  Difficulty walking, Decreased endurance, Pain, Decreased activity tolerance, Decreased balance, Postural dysfunction, Decreased strength  Visit Diagnosis: Chronic bilateral low back pain without sciatica  Muscle weakness (generalized)  Other abnormalities of gait and mobility     Problem List Patient Active Problem List   Diagnosis Date Noted  . Benign prostatic hyperplasia with nocturia 09/20/2019  . Closed fracture of first lumbar vertebra ([place]) 09/20/2019  . Idiopathic progressive neuropathy 09/20/2019  . Other fatigue 09/20/2019  . Venous insufficiency of both lower extremities 09/20/2019  . Anemia 07/25/2019  . Left inguinal hernia 02/07/2019  . Left leg pain 05/18/2018  . Pneumonia 05/18/2018  . Chronic respiratory failure with hypoxia ([place]) 01/12/2015  . COPD with acute exacerbation ([place]) 01/21/2013  . Hyperlipidemia 12/01/2011  . Hypertension 12/01/2011  . CAD (coronary artery  disease) 05/23/2011  . Lung nodule 09/16/2008  . ALLERGIC RHINITIS 07/24/2007  . COPD mixed type ([place]) 07/24/2007  . BURSITIS 07/24/2007    Hilda Blades, PT, DPT, LAT, ATC 02/13/20  10:45 AM Phone: [phone] Fax: [place] [facility] [address] [place], [place], [address] Phone: [phone]   Fax:  [phone]  Name: ESMERALDA BLANFORD MRN: [id number] Date of Birth: [birth year]

## 2020-02-22 ENCOUNTER — Ambulatory Visit: Payer: Medicare Other | Attending: Internal Medicine | Admitting: Physical Therapy

## 2020-02-22 ENCOUNTER — Encounter: Payer: Self-pay | Admitting: Physical Therapy

## 2020-02-22 ENCOUNTER — Other Ambulatory Visit: Payer: Self-pay

## 2020-02-22 DIAGNOSIS — M6281 Muscle weakness (generalized): Secondary | ICD-10-CM | POA: Diagnosis not present

## 2020-02-22 DIAGNOSIS — R2689 Other abnormalities of gait and mobility: Secondary | ICD-10-CM | POA: Diagnosis not present

## 2020-02-22 DIAGNOSIS — G8929 Other chronic pain: Secondary | ICD-10-CM | POA: Insufficient documentation

## 2020-02-22 DIAGNOSIS — M545 Low back pain, unspecified: Secondary | ICD-10-CM

## 2020-02-22 DIAGNOSIS — R2681 Unsteadiness on feet: Secondary | ICD-10-CM | POA: Diagnosis not present

## 2020-02-22 DIAGNOSIS — R262 Difficulty in walking, not elsewhere classified: Secondary | ICD-10-CM | POA: Insufficient documentation

## 2020-02-22 NOTE — Therapy (Signed)
[place], [place], [address] Phone: [phone]   Fax:  [phone]  Physical Therapy Treatment  Patient Details  Name: [name] MRN: [id number] Date of Birth: 1935-06-08 Referring Provider (PT): Gayland Curry, DO   Encounter Date: 02/22/2020   PT End of Session - 02/22/20 1055    Visit Number 9    Number of Visits 16    Date for PT Re-Evaluation 03/03/20    Authorization Type MEDICARE PART A AND B    PT Start Time 1016    PT Stop Time 1057    PT Time Calculation (min) 41 min           Past Medical History:  Diagnosis Date  . Allergic rhinitis, cause unspecified   . Arthritis   . COPD (chronic obstructive pulmonary disease) ([place])   . Dyspnea   . Dysrhythmia    "1 episode of tachycardia in 1980's, been on it ever since"  . Emphysema of lung ([place])   . Hypertension   . Left inguinal hernia 02/07/2019  . Low hemoglobin    low level  . Neuromuscular disorder ([place])   . Neuropathy    feet  . Pneumonia    History of, last Oct 2019  . Pulmonary nodule     Past Surgical History:  Procedure Laterality Date  . APPENDECTOMY  1943  . CARDIAC CATHETERIZATION  2003   performed at [facility], Dr. Fransico Him  . CATARACT EXTRACTION  2008   Dr. Darleen Crocker  . CATARACT EXTRACTION, BILATERAL    . EYE SURGERY    . INGUINAL HERNIA REPAIR Left 02/07/2019   Procedure: OPEN REPAIR LEFT INGUINAL HERNIA WITH MESH;  Surgeon: Fanny Skates, MD;  Location: [place];  Service: General;  Laterality: Left;  SPINAL AND TAP BLOCK ANESTHESIA  . ROTATOR CUFF REPAIR  2002   Dr. Kathryne Hitch  . TONSILLECTOMY  1945  . VASECTOMY      There were no vitals filed for this visit.   Subjective Assessment - 02/22/20 1045    Subjective Pt reports he is walking more brisk and feels a little more sure of himself regarding balance and feels legs are stronger.    Currently in Pain? No/denies   no more than usual                             [facility] Adult PT Treatment/Exercise - 02/22/20 0001      Lumbar Exercises: Stretches   Passive Hamstring Stretch 2 reps;30 seconds    Passive Hamstring Stretch Limitations supine, PROM     Lower Trunk Rotation 5 reps;10 seconds    Piriformis Stretch 30 seconds    Piriformis Stretch Limitations supine, PROM     Figure 4 Stretch 30 seconds    Figure 4 Stretch Limitations supine, PROM    Gastroc Stretch 30 seconds    Gastroc Stretch Limitations slant board      Lumbar Exercises: Aerobic   Nustep L1 x 5 min (LE only)      Lumbar Exercises: Standing   Heel Raises 10 reps   2 sets   Other Standing Lumbar Exercises Standing marching 2x10      Lumbar Exercises: Seated   Sit to Stand 5 reps    Sit to Stand Limitations 2 sets from mat without UE       Lumbar Exercises: Supine   Clam 10 reps  2 sets   Clam Limitations green    Bridge 10 reps   2 sets   Straight Leg Raise 10 reps   2 sets                   PT Short Term Goals - 02/11/20 1247      PT SHORT TERM GOAL #1   Title Patient will be I with initial HEP to progress in PT    Time 4    Period Weeks    Status On-going      PT SHORT TERM GOAL #2   Title Patient will exhibit improved TUG to </=15 sec to improve mobility and reduce fall risk    Baseline 16 sec improved from 17.27    Time 4    Period Weeks    Status On-going      PT SHORT TERM GOAL #3   Title Patient will be able to perform STS without UE support to indicate improved LE strength    Baseline able to perform 1 STS from standard chair without UE after 3 attempts    Time 4    Period Weeks    Status Achieved             PT Long Term Goals - 01/07/20 1337      PT LONG TERM GOAL #1   Title Patient will be I with final HEP to maintain progress from PT    Time 8    Period Weeks    Status New    Target Date 03/03/20      PT LONG TERM GOAL #2   Title Patient will exhibit improved TUG to < 13.5 sec to  reduce fall risk    Time 8    Period Weeks    Status New    Target Date 03/03/20      PT LONG TERM GOAL #3   Title Patient will demonstrate improved strength and fall risk by performing 5xSTS in </= 14 sec without using UE support    Time 8    Period Weeks    Status New    Target Date 03/03/20      PT LONG TERM GOAL #4   Title Patient will report improved functional level to </= 46% limitation on FOTO    Time 8    Period Weeks    Status New    Target Date 03/03/20      PT LONG TERM GOAL #5   Title Patient will report </= 1/10 pain level with standing 30 minutes    Time 8    Period Weeks    Status New    Target Date 03/03/20                 Plan - 02/22/20 1057    Clinical Impression Statement Pt reports improved gait speed and feeling legs are stronger. He is progressing toward LTGs. Contiuned with LE strength and lumbar/hip ROM.    PT Next Visit Plan Assess HEP and progress PRN, initiate balance training, continue progression of general BLE and core strengthening, hip flexibility    PT Home Exercise Plan [id number]: supine piriformis stretch, seated hamstring stretch, supine clam with green, bridge with green, SLR, standing marching, heel raises, sit<>stand, tandem balance           Patient will benefit from skilled therapeutic intervention in order to improve the following deficits and impairments:  Difficulty walking, Decreased endurance, Pain, Decreased activity tolerance, Decreased balance,  Postural dysfunction, Decreased strength  Visit Diagnosis: Muscle weakness (generalized)  Chronic bilateral low back pain without sciatica  Other abnormalities of gait and mobility  Acute midline low back pain without sciatica     Problem List Patient Active Problem List   Diagnosis Date Noted  . Benign prostatic hyperplasia with nocturia 09/20/2019  . Closed fracture of first lumbar vertebra ([place]) 09/20/2019  . Idiopathic progressive neuropathy 09/20/2019  .  Other fatigue 09/20/2019  . Venous insufficiency of both lower extremities 09/20/2019  . Anemia 07/25/2019  . Left inguinal hernia 02/07/2019  . Left leg pain 05/18/2018  . Pneumonia 05/18/2018  . Chronic respiratory failure with hypoxia ([place]) 01/12/2015  . COPD with acute exacerbation ([place]) 01/21/2013  . Hyperlipidemia 12/01/2011  . Hypertension 12/01/2011  . CAD (coronary artery disease) 05/23/2011  . Lung nodule 09/16/2008  . ALLERGIC RHINITIS 07/24/2007  . COPD mixed type ([place]) 07/24/2007  . BURSITIS 07/24/2007    Dorene Ar, PTA 02/22/2020, 10:58 AM  [facility] [address] [place], [place], [address] Phone: [phone]   Fax:  [phone]  Name: [name] MRN: [id number] Date of Birth: 09-16-1934

## 2020-02-27 ENCOUNTER — Ambulatory Visit: Payer: Medicare Other | Admitting: Physical Therapy

## 2020-02-27 ENCOUNTER — Other Ambulatory Visit: Payer: Self-pay

## 2020-02-27 ENCOUNTER — Encounter: Payer: Self-pay | Admitting: Physical Therapy

## 2020-02-27 DIAGNOSIS — M6281 Muscle weakness (generalized): Secondary | ICD-10-CM | POA: Diagnosis not present

## 2020-02-27 DIAGNOSIS — M545 Low back pain, unspecified: Secondary | ICD-10-CM

## 2020-02-27 DIAGNOSIS — R262 Difficulty in walking, not elsewhere classified: Secondary | ICD-10-CM | POA: Diagnosis not present

## 2020-02-27 DIAGNOSIS — R2689 Other abnormalities of gait and mobility: Secondary | ICD-10-CM

## 2020-02-27 DIAGNOSIS — R2681 Unsteadiness on feet: Secondary | ICD-10-CM | POA: Diagnosis not present

## 2020-02-27 DIAGNOSIS — G8929 Other chronic pain: Secondary | ICD-10-CM | POA: Diagnosis not present

## 2020-02-27 NOTE — Therapy (Signed)
[place], [place], [address] Phone: [phone]   Fax:  [phone]  Physical Therapy Treatment  Progress Note Reporting Period 01/07/2020 to 02/27/2020  See note below for Objective Data and Assessment of Progress/Goals.    Patient Details  Name: [name] MRN: [id number] Date of Birth: 11/09/1934 Referring Provider (PT): Gayland Curry, DO   Encounter Date: 02/27/2020   PT End of Session - 02/27/20 1136    Visit Number 10    Number of Visits 14    Date for PT Re-Evaluation 03/26/20    Authorization Type MEDICARE PART A AND B    Progress Note Due on Visit 20    PT Start Time 1130    PT Stop Time 1215    PT Time Calculation (min) 45 min    Activity Tolerance Patient tolerated treatment well    Behavior During Therapy [facility] for tasks assessed/performed           Past Medical History:  Diagnosis Date  . Allergic rhinitis, cause unspecified   . Arthritis   . COPD (chronic obstructive pulmonary disease) ([place])   . Dyspnea   . Dysrhythmia    "1 episode of tachycardia in 1980's, been on it ever since"  . Emphysema of lung ([place])   . Hypertension   . Left inguinal hernia 02/07/2019  . Low hemoglobin    low level  . Neuromuscular disorder ([place])   . Neuropathy    feet  . Pneumonia    History of, last Oct 2019  . Pulmonary nodule     Past Surgical History:  Procedure Laterality Date  . APPENDECTOMY  1943  . CARDIAC CATHETERIZATION  2003   performed at [facility], Dr. Fransico Him  . CATARACT EXTRACTION  2008   Dr. Darleen Crocker  . CATARACT EXTRACTION, BILATERAL    . EYE SURGERY    . INGUINAL HERNIA REPAIR Left 02/07/2019   Procedure: OPEN REPAIR LEFT INGUINAL HERNIA WITH MESH;  Surgeon: Fanny Skates, MD;  Location: [place];  Service: General;  Laterality: Left;  SPINAL AND TAP BLOCK ANESTHESIA  . ROTATOR CUFF REPAIR  2002   Dr. Kathryne Hitch  . TONSILLECTOMY  1945  . VASECTOMY       There were no vitals filed for this visit.   Subjective Assessment - 02/27/20 1135    Subjective Patient reports he is moving and feeling better. He is having some soreness on the front of his left hip that feels muscular. His back still bothers him, but it is at a point where he can live with it and it does not limit him.    Limitations Standing;Walking;House hold activities;Lifting    How long can you sit comfortably? No limitation as long as soft chair    How long can you stand comfortably? 1 hour    How long can you walk comfortably? Mainly limited secondary to breathing    Patient Stated Goals Totally get off the walker, improve with using cane or transition off the cane, get more strength and improve balance    Currently in Pain? No/denies              [facility] PT Assessment - 02/27/20 0001      Assessment   Medical Diagnosis Low back pain    Referring Provider (PT) Gayland Curry, DO    Onset Date/Surgical Date 02/28/19    Hand Dominance Right    Next MD Visit 05/19/2020  Prior Therapy Yes - OPPT and pulmonary rehab      Precautions   Precautions Fall      Restrictions   Weight Bearing Restrictions No      Balance Screen   Has the patient fallen in the past 6 months No    Has the patient had a decrease in activity level because of a fear of falling?  No    Is the patient reluctant to leave their home because of a fear of falling?  No      Prior Function   Level of Independence Independent with basic ADLs    Vocation Retired    Leisure None reported      Cognition   Overall Cognitive Status Within Functional Limits for tasks assessed      Observation/Other Assessments   Focus on Therapeutic Outcomes (FOTO)  48% limitation      Strength   Right Hip Flexion 4/5    Right Hip Extension 4-/5    Right Hip ABduction 4+/5    Left Hip Flexion 4/5    Left Hip Extension 4-/5    Left Hip ABduction 4-/5    Right Knee Flexion 5/5    Right Knee Extension 5/5     Left Knee Flexion 5/5    Left Knee Extension 5/5    Right Ankle Dorsiflexion 5/5    Right Ankle Plantar Flexion 4/5    Left Ankle Dorsiflexion 5/5    Left Ankle Plantar Flexion 4/5      Transfers   Sit to Stand 7: Independent      Ambulation/Gait   Ambulation/Gait Yes    Ambulation/Gait Assistance 6: Modified independent (Device/Increase time)    Gait Comments Patient able to ambulate without [facility]      Standardized Balance Assessment   Five times sit to stand comments  15 sec without UE assist      Timed Up and Go Test   TUG Normal TUG    Normal TUG (seconds) 12.84   no assistive device                        [facility] Adult PT Treatment/Exercise - 02/27/20 0001      Self-Care   Self-Care Other Self-Care Comments    Other Self-Care Comments  Exam findings and updated POC, FOTO, balance training, HEP consistency      Exercises   Exercises Lumbar      Lumbar Exercises: Stretches   Passive Hamstring Stretch 30 seconds    Lower Trunk Rotation 3 reps;10 seconds    Hip Flexor Stretch 30 seconds      Lumbar Exercises: Aerobic   Nustep L1 x 5 min (LE only)      Lumbar Exercises: Seated   Sit to Stand 5 reps    Sit to Stand Limitations 2 sets from chair without UE       Lumbar Exercises: Supine   Bridge 10 reps    Straight Leg Raise 10 reps                  PT Education - 02/27/20 1136    Education Details HEP consistency    Person(s) Educated Patient    Methods Explanation;Verbal cues    Comprehension Verbalized understanding;Need further instruction;Verbal cues required            PT Short Term Goals - 02/27/20 1438      PT SHORT TERM GOAL #1   Title Patient will  be I with initial HEP to progress in PT    Status Achieved      PT SHORT TERM GOAL #2   Title Patient will exhibit improved TUG to </=15 sec to improve mobility and reduce fall risk    Baseline 12.84 sec    Status Achieved      PT SHORT TERM GOAL #3   Title Patient will be  able to perform STS without UE support to indicate improved LE strength    Baseline Patient able to perform  consecutive sit<>stand without UE assist    Status Achieved             PT Long Term Goals - 02/27/20 1151      PT LONG TERM GOAL #1   Title Patient will be I with final HEP to maintain progress from PT    Baseline Patient continues to report inconsistency with HEP    Time 4    Period Weeks    Status On-going    Target Date 03/26/20      PT LONG TERM GOAL #2   Title Patient will exhibit improved TUG to < 13.5 sec to reduce fall risk    Baseline 12.84 sec    Time --    Period --    Status Achieved      PT LONG TERM GOAL #3   Title Patient will demonstrate improved strength and fall risk by performing 5xSTS in </= 14 sec without using UE support    Baseline 15 sec    Time 4    Period Weeks    Status On-going    Target Date 03/26/20      PT LONG TERM GOAL #4   Title Patient will report improved functional level to </= 46% limitation on FOTO    Baseline 48% limitation    Time 4    Period Weeks    Status On-going    Target Date 03/26/20      PT LONG TERM GOAL #5   Title Patient will report </= 1/10 pain level with standing 30 minutes    Baseline Patient reports no real back pain with standing    Time --    Period --    Status Achieved                 Plan - 02/27/20 1136    Clinical Impression Statement Patient tolerated therapy well with no adverse effects. He demonstrates improvement in all strength and balance measures but does continue to exhibit strength functional limitations and inconsistency with HEP that he would benefit from continued skilled PT to address. In regard to his functional limitations his breathing in a major limiting factor rather than back pain. He continues to have balance and gross strength deficits and fatigues quickly with exercise. He would benefit from continued skilled PT to progress strength and further address balance  deficits to improve mobility and maximize functional ability.    Personal Factors and Comorbidities Comorbidity 3+;Age;Fitness;Past/Current Experience;Time since onset of injury/illness/exacerbation    Comorbidities Former smoker on supplemental O2 2 - 4L, followed for COPD/emphysema, pulmonary nodules, hypoxic respiratory failure, complicated by CAD, Anemia    Examination-Activity Limitations Locomotion Level;Stand;Lift    Examination-Participation Restrictions Community Activity;Shop;Yard Work;Cleaning    PT Frequency 1x / week    PT Duration 4 weeks    PT Treatment/Interventions ADLs/Self Care Home Management;Cryotherapy;Electrical Stimulation;Moist Heat;Neuromuscular re-education;DME Instruction;Balance training;Therapeutic exercise;Therapeutic activities;Functional mobility training;Stair training;Gait training;Patient/family education;Manual techniques;Energy conservation;Dry needling;Passive range of motion;Joint  Manipulations;Spinal Manipulations    PT Next Visit Plan Assess HEP and progress PRN, progress balance training, continue progression of general BLE and core strengthening, hip flexibility    PT Home Exercise Plan [id number]: supine piriformis stretch, seated hamstring stretch, supine clam with green, bridge with green, SLR, standing marching, heel raises, sit<>stand, tandem balance    Consulted and Agree with Plan of Care Patient           Patient will benefit from skilled therapeutic intervention in order to improve the following deficits and impairments:  Difficulty walking, Decreased endurance, Pain, Decreased activity tolerance, Decreased balance, Postural dysfunction, Decreased strength  Visit Diagnosis: Muscle weakness (generalized)  Chronic bilateral low back pain without sciatica  Other abnormalities of gait and mobility     Problem List Patient Active Problem List   Diagnosis Date Noted  . Benign prostatic hyperplasia with nocturia 09/20/2019  . Closed fracture  of first lumbar vertebra ([place]) 09/20/2019  . Idiopathic progressive neuropathy 09/20/2019  . Other fatigue 09/20/2019  . Venous insufficiency of both lower extremities 09/20/2019  . Anemia 07/25/2019  . Left inguinal hernia 02/07/2019  . Left leg pain 05/18/2018  . Pneumonia 05/18/2018  . Chronic respiratory failure with hypoxia ([place]) 01/12/2015  . COPD with acute exacerbation ([place]) 01/21/2013  . Hyperlipidemia 12/01/2011  . Hypertension 12/01/2011  . CAD (coronary artery disease) 05/23/2011  . Lung nodule 09/16/2008  . ALLERGIC RHINITIS 07/24/2007  . COPD mixed type ([place]) 07/24/2007  . BURSITIS 07/24/2007    Hilda Blades, PT, DPT, LAT, ATC 02/27/20  2:51 PM Phone: [phone] Fax: [place] [facility] [address] [place], [place], [address] Phone: [phone]   Fax:  [phone]  Name: [name] MRN: [id number] Date of Birth: [birth year]

## 2020-03-04 ENCOUNTER — Ambulatory Visit: Payer: Medicare Other | Admitting: Physical Therapy

## 2020-03-04 ENCOUNTER — Other Ambulatory Visit: Payer: Self-pay

## 2020-03-04 ENCOUNTER — Encounter: Payer: Self-pay | Admitting: Physical Therapy

## 2020-03-04 DIAGNOSIS — R2681 Unsteadiness on feet: Secondary | ICD-10-CM | POA: Diagnosis not present

## 2020-03-04 DIAGNOSIS — G8929 Other chronic pain: Secondary | ICD-10-CM | POA: Diagnosis not present

## 2020-03-04 DIAGNOSIS — M545 Low back pain, unspecified: Secondary | ICD-10-CM

## 2020-03-04 DIAGNOSIS — R262 Difficulty in walking, not elsewhere classified: Secondary | ICD-10-CM | POA: Diagnosis not present

## 2020-03-04 DIAGNOSIS — R2689 Other abnormalities of gait and mobility: Secondary | ICD-10-CM

## 2020-03-04 DIAGNOSIS — M6281 Muscle weakness (generalized): Secondary | ICD-10-CM

## 2020-03-04 NOTE — Therapy (Signed)
[place], [place], [address] Phone: [phone]   Fax:  [phone]  Physical Therapy Treatment  Patient Details  Name: [name] MRN: [id number] Date of Birth: 1934/08/31 Referring Provider (PT): Gayland Curry, DO   Encounter Date: 03/04/2020   PT End of Session - 03/04/20 1321    Visit Number 11    Number of Visits 14    Date for PT Re-Evaluation 03/26/20    Authorization Type MEDICARE PART A AND B    PT Start Time 1315    PT Stop Time 1353    PT Time Calculation (min) 38 min           Past Medical History:  Diagnosis Date  . Allergic rhinitis, cause unspecified   . Arthritis   . COPD (chronic obstructive pulmonary disease) ([place])   . Dyspnea   . Dysrhythmia    "1 episode of tachycardia in 1980's, been on it ever since"  . Emphysema of lung ([place])   . Hypertension   . Left inguinal hernia 02/07/2019  . Low hemoglobin    low level  . Neuromuscular disorder ([place])   . Neuropathy    feet  . Pneumonia    History of, last Oct 2019  . Pulmonary nodule     Past Surgical History:  Procedure Laterality Date  . APPENDECTOMY  1943  . CARDIAC CATHETERIZATION  2003   performed at [facility], Dr. Fransico Him  . CATARACT EXTRACTION  2008   Dr. Darleen Crocker  . CATARACT EXTRACTION, BILATERAL    . EYE SURGERY    . INGUINAL HERNIA REPAIR Left 02/07/2019   Procedure: OPEN REPAIR LEFT INGUINAL HERNIA WITH MESH;  Surgeon: Fanny Skates, MD;  Location: [place];  Service: General;  Laterality: Left;  SPINAL AND TAP BLOCK ANESTHESIA  . ROTATOR CUFF REPAIR  2002   Dr. Kathryne Hitch  . TONSILLECTOMY  1945  . VASECTOMY      There were no vitals filed for this visit.   Subjective Assessment - 03/04/20 1321    Subjective Pt reports back is about 80% better.    Currently in Pain? No/denies                             [facility] Adult PT Treatment/Exercise - 03/04/20 0001      Lumbar  Exercises: Stretches   Passive Hamstring Stretch 30 seconds    Lower Trunk Rotation 3 reps;10 seconds    Piriformis Stretch 30 seconds    Piriformis Stretch Limitations supine    Figure 4 Stretch 30 seconds    Figure 4 Stretch Limitations supine     Gastroc Stretch 30 seconds      Lumbar Exercises: Aerobic   Nustep L1 x 5 min (LE only)      Lumbar Exercises: Standing   Heel Raises 10 reps   3 sets   Other Standing Lumbar Exercises Standing marching 3x10      Lumbar Exercises: Seated   Sit to Stand Limitations 3 reps without UE support , then 5 x with hands on thighs       Lumbar Exercises: Supine   Clam 10 reps    3 sets   Clam Limitations green     Bridge 10 reps   3 sets   Straight Leg Raise 10 reps   3 sets  PT Short Term Goals - 02/27/20 1438      PT SHORT TERM GOAL #1   Title Patient will be I with initial HEP to progress in PT    Status Achieved      PT SHORT TERM GOAL #2   Title Patient will exhibit improved TUG to </=15 sec to improve mobility and reduce fall risk    Baseline 12.84 sec    Status Achieved      PT SHORT TERM GOAL #3   Title Patient will be able to perform STS without UE support to indicate improved LE strength    Baseline Patient able to perform  consecutive sit<>stand without UE assist    Status Achieved             PT Long Term Goals - 02/27/20 1151      PT LONG TERM GOAL #1   Title Patient will be I with final HEP to maintain progress from PT    Baseline Patient continues to report inconsistency with HEP    Time 4    Period Weeks    Status On-going    Target Date 03/26/20      PT LONG TERM GOAL #2   Title Patient will exhibit improved TUG to < 13.5 sec to reduce fall risk    Baseline 12.84 sec    Time --    Period --    Status Achieved      PT LONG TERM GOAL #3   Title Patient will demonstrate improved strength and fall risk by performing 5xSTS in </= 14 sec without using UE support    Baseline 15  sec    Time 4    Period Weeks    Status On-going    Target Date 03/26/20      PT LONG TERM GOAL #4   Title Patient will report improved functional level to </= 46% limitation on FOTO    Baseline 48% limitation    Time 4    Period Weeks    Status On-going    Target Date 03/26/20      PT LONG TERM GOAL #5   Title Patient will report </= 1/10 pain level with standing 30 minutes    Baseline Patient reports no real back pain with standing    Time --    Period --    Status Achieved                 Plan - 03/04/20 1350    Clinical Impression Statement Pt reports fatigue from not sleeping well. He is able to complete 3 sets of exercises today except sit-stands. He was able to complete 3 reps without UE.    PT Next Visit Plan add balance to HEP    PT Home Exercise Plan [id number]: supine piriformis stretch, seated hamstring stretch, supine clam with green, bridge with green, SLR, standing marching, heel raises, sit<>stand, tandem balance           Patient will benefit from skilled therapeutic intervention in order to improve the following deficits and impairments:  Difficulty walking, Decreased endurance, Pain, Decreased activity tolerance, Decreased balance, Postural dysfunction, Decreased strength  Visit Diagnosis: Chronic bilateral low back pain without sciatica  Muscle weakness (generalized)  Other abnormalities of gait and mobility  Acute midline low back pain without sciatica  Unsteadiness on feet  Difficulty in walking, not elsewhere classified     Problem List Patient Active Problem List   Diagnosis Date Noted  .  Benign prostatic hyperplasia with nocturia 09/20/2019  . Closed fracture of first lumbar vertebra ([place]) 09/20/2019  . Idiopathic progressive neuropathy 09/20/2019  . Other fatigue 09/20/2019  . Venous insufficiency of both lower extremities 09/20/2019  . Anemia 07/25/2019  . Left inguinal hernia 02/07/2019  . Left leg pain 05/18/2018  .  Pneumonia 05/18/2018  . Chronic respiratory failure with hypoxia ([place]) 01/12/2015  . COPD with acute exacerbation ([place]) 01/21/2013  . Hyperlipidemia 12/01/2011  . Hypertension 12/01/2011  . CAD (coronary artery disease) 05/23/2011  . Lung nodule 09/16/2008  . ALLERGIC RHINITIS 07/24/2007  . COPD mixed type ([place]) 07/24/2007  . BURSITIS 07/24/2007    Dorene Ar, PTA 03/04/2020, 1:53 PM  [place], [place], [address] Phone: [phone]   Fax:  [phone]  Name: JOHNTHAN AXTMAN MRN: [id number] Date of Birth: 1934/11/29

## 2020-03-05 ENCOUNTER — Telehealth: Payer: Self-pay | Admitting: *Deleted

## 2020-03-05 NOTE — Telephone Encounter (Signed)
Patient called requesting samples of Myrebetriq. Samples left this morning. Patient notified through myChart that samples available. Left up front for pick up.  01/21/2020 OV Note: . Benign prostatic hyperplasia with nocturia -nocturia is considerable -cont flomax -add myrbetriq 25mg  daily--when samples arrive we will call him

## 2020-03-10 ENCOUNTER — Ambulatory Visit: Payer: Medicare Other | Admitting: Physical Therapy

## 2020-03-10 ENCOUNTER — Other Ambulatory Visit: Payer: Self-pay

## 2020-03-10 ENCOUNTER — Encounter: Payer: Self-pay | Admitting: Physical Therapy

## 2020-03-10 DIAGNOSIS — R2689 Other abnormalities of gait and mobility: Secondary | ICD-10-CM

## 2020-03-10 DIAGNOSIS — M545 Low back pain, unspecified: Secondary | ICD-10-CM

## 2020-03-10 DIAGNOSIS — G8929 Other chronic pain: Secondary | ICD-10-CM | POA: Diagnosis not present

## 2020-03-10 DIAGNOSIS — R262 Difficulty in walking, not elsewhere classified: Secondary | ICD-10-CM

## 2020-03-10 DIAGNOSIS — M6281 Muscle weakness (generalized): Secondary | ICD-10-CM

## 2020-03-10 DIAGNOSIS — R2681 Unsteadiness on feet: Secondary | ICD-10-CM

## 2020-03-10 NOTE — Therapy (Signed)
[place], [place], [address] Phone: [phone]   Fax:  [phone]  Physical Therapy Treatment  Patient Details  Name: [name] MRN: [id number] Date of Birth: Aug 05, 1935 Referring Provider (PT): Gayland Curry, DO   Encounter Date: 03/10/2020   PT End of Session - 03/10/20 1238    Visit Number 12    Number of Visits 14    Date for PT Re-Evaluation 03/26/20    Authorization Type MEDICARE PART A AND B    PT Start Time 1230    PT Stop Time 1310    PT Time Calculation (min) 40 min           Past Medical History:  Diagnosis Date  . Allergic rhinitis, cause unspecified   . Arthritis   . COPD (chronic obstructive pulmonary disease) ([place])   . Dyspnea   . Dysrhythmia    "1 episode of tachycardia in 1980's, been on it ever since"  . Emphysema of lung ([place])   . Hypertension   . Left inguinal hernia 02/07/2019  . Low hemoglobin    low level  . Neuromuscular disorder ([place])   . Neuropathy    feet  . Pneumonia    History of, last Oct 2019  . Pulmonary nodule     Past Surgical History:  Procedure Laterality Date  . APPENDECTOMY  1943  . CARDIAC CATHETERIZATION  2003   performed at [facility], Dr. Fransico Him  . CATARACT EXTRACTION  2008   Dr. Darleen Crocker  . CATARACT EXTRACTION, BILATERAL    . EYE SURGERY    . INGUINAL HERNIA REPAIR Left 02/07/2019   Procedure: OPEN REPAIR LEFT INGUINAL HERNIA WITH MESH;  Surgeon: Fanny Skates, MD;  Location: [facility];  Service: General;  Laterality: Left;  SPINAL AND TAP BLOCK ANESTHESIA  . ROTATOR CUFF REPAIR  2002   Dr. Kathryne Hitch  . TONSILLECTOMY  1945  . VASECTOMY      There were no vitals filed for this visit.   Subjective Assessment - 03/10/20 1237    Subjective Pt repors no pain today and back pain has been tolerable.    Currently in Pain? No/denies                             [facility] Adult PT Treatment/Exercise - 03/10/20  0001      Neuro Re-ed    Neuro Re-ed Details  SLS and tandem trials       Lumbar Exercises: Stretches   Passive Hamstring Stretch 30 seconds    Lower Trunk Rotation 3 reps;10 seconds    Piriformis Stretch 30 seconds    Piriformis Stretch Limitations supine    Figure 4 Stretch 30 seconds    Figure 4 Stretch Limitations supine     Gastroc Stretch 30 seconds      Lumbar Exercises: Aerobic   Nustep L1 x 5 min (LE only)      Lumbar Exercises: Standing   Heel Raises 10 reps   3 sets   Other Standing Lumbar Exercises Standing marching 3x10      Lumbar Exercises: Seated   Sit to Stand 5 reps    Sit to Stand Limitations 3 reps without UE , then 2 reps using hands on thighs       Lumbar Exercises: Supine   Clam 10 reps    3 sets   Clam Limitations green  Bridge 10 reps   3 sets   Straight Leg Raise 10 reps   3 sets                   PT Short Term Goals - 02/27/20 1438      PT SHORT TERM GOAL #1   Title Patient will be I with initial HEP to progress in PT    Status Achieved      PT SHORT TERM GOAL #2   Title Patient will exhibit improved TUG to </=15 sec to improve mobility and reduce fall risk    Baseline 12.84 sec    Status Achieved      PT SHORT TERM GOAL #3   Title Patient will be able to perform STS without UE support to indicate improved LE strength    Baseline Patient able to perform  consecutive sit<>stand without UE assist    Status Achieved             PT Long Term Goals - 02/27/20 1151      PT LONG TERM GOAL #1   Title Patient will be I with final HEP to maintain progress from PT    Baseline Patient continues to report inconsistency with HEP    Time 4    Period Weeks    Status On-going    Target Date 03/26/20      PT LONG TERM GOAL #2   Title Patient will exhibit improved TUG to < 13.5 sec to reduce fall risk    Baseline 12.84 sec    Time --    Period --    Status Achieved      PT LONG TERM GOAL #3   Title Patient will demonstrate  improved strength and fall risk by performing 5xSTS in </= 14 sec without using UE support    Baseline 15 sec    Time 4    Period Weeks    Status On-going    Target Date 03/26/20      PT LONG TERM GOAL #4   Title Patient will report improved functional level to </= 46% limitation on FOTO    Baseline 48% limitation    Time 4    Period Weeks    Status On-going    Target Date 03/26/20      PT LONG TERM GOAL #5   Title Patient will report </= 1/10 pain level with standing 30 minutes    Baseline Patient reports no real back pain with standing    Time --    Period --    Status Achieved                 Plan - 03/10/20 1251    Clinical Impression Statement Pt reports he is about the same as last session. He has difficulty completing more than 3 reps of sit-stand without UE support. Worked on tandem and SLS trials with intermitent UE support required for SLS.    Comorbidities Former smoker on supplemental O2 2 - 4L, followed for COPD/emphysema, pulmonary nodules, hypoxic respiratory failure, complicated by CAD, Anemia    PT Next Visit Plan review balance , finalize HEP over remaining visits.    PT Home Exercise Plan [email or web address]: supine piriformis stretch, seated hamstring stretch, supine clam with green, bridge with green, SLR, standing marching, heel raises, sit<>stand, tandem balance           Patient will benefit from skilled therapeutic intervention in order to improve the following deficits and impairments:  Difficulty walking, Decreased endurance, Pain, Decreased activity tolerance, Decreased balance, Postural dysfunction, Decreased strength  Visit Diagnosis: Chronic bilateral low back pain without sciatica  Muscle weakness (generalized)  Other abnormalities of gait and mobility  Acute midline low back pain without sciatica  Unsteadiness on feet  Difficulty in walking, not elsewhere classified     Problem List Patient Active Problem List   Diagnosis Date Noted    . Benign prostatic hyperplasia with nocturia 09/20/2019  . Closed fracture of first lumbar vertebra ([place]) 09/20/2019  . Idiopathic progressive neuropathy 09/20/2019  . Other fatigue 09/20/2019  . Venous insufficiency of both lower extremities 09/20/2019  . Anemia 07/25/2019  . Left inguinal hernia 02/07/2019  . Left leg pain 05/18/2018  . Pneumonia 05/18/2018  . Chronic respiratory failure with hypoxia ([place]) 01/12/2015  . COPD with acute exacerbation ([place]) 01/21/2013  . Hyperlipidemia 12/01/2011  . Hypertension 12/01/2011  . CAD (coronary artery disease) 05/23/2011  . Lung nodule 09/16/2008  . ALLERGIC RHINITIS 07/24/2007  . COPD mixed type ([place]) 07/24/2007  . BURSITIS 07/24/2007    Dorene Ar, PTA 03/10/2020, 12:55 PM  [place], [place], [address] Phone: [phone]   Fax:  [phone]  Name: ZALMAN HULL MRN: [id number] Date of Birth: 1934/10/03

## 2020-03-11 ENCOUNTER — Encounter: Payer: Self-pay | Admitting: Physical Therapy

## 2020-03-11 DIAGNOSIS — D2261 Melanocytic nevi of right upper limb, including shoulder: Secondary | ICD-10-CM | POA: Diagnosis not present

## 2020-03-11 DIAGNOSIS — D225 Melanocytic nevi of trunk: Secondary | ICD-10-CM | POA: Diagnosis not present

## 2020-03-11 DIAGNOSIS — L570 Actinic keratosis: Secondary | ICD-10-CM | POA: Diagnosis not present

## 2020-03-11 DIAGNOSIS — Z86018 Personal history of other benign neoplasm: Secondary | ICD-10-CM | POA: Diagnosis not present

## 2020-03-11 DIAGNOSIS — L905 Scar conditions and fibrosis of skin: Secondary | ICD-10-CM | POA: Diagnosis not present

## 2020-03-11 DIAGNOSIS — L821 Other seborrheic keratosis: Secondary | ICD-10-CM | POA: Diagnosis not present

## 2020-03-11 DIAGNOSIS — S40811A Abrasion of right upper arm, initial encounter: Secondary | ICD-10-CM | POA: Diagnosis not present

## 2020-03-11 DIAGNOSIS — L578 Other skin changes due to chronic exposure to nonionizing radiation: Secondary | ICD-10-CM | POA: Diagnosis not present

## 2020-03-11 DIAGNOSIS — L219 Seborrheic dermatitis, unspecified: Secondary | ICD-10-CM | POA: Diagnosis not present

## 2020-03-11 DIAGNOSIS — Z85828 Personal history of other malignant neoplasm of skin: Secondary | ICD-10-CM | POA: Diagnosis not present

## 2020-03-11 DIAGNOSIS — D485 Neoplasm of uncertain behavior of skin: Secondary | ICD-10-CM | POA: Diagnosis not present

## 2020-03-19 ENCOUNTER — Other Ambulatory Visit: Payer: Self-pay

## 2020-03-19 ENCOUNTER — Ambulatory Visit: Payer: Medicare Other | Attending: Internal Medicine | Admitting: Physical Therapy

## 2020-03-19 ENCOUNTER — Encounter: Payer: Self-pay | Admitting: Physical Therapy

## 2020-03-19 DIAGNOSIS — M6281 Muscle weakness (generalized): Secondary | ICD-10-CM | POA: Diagnosis not present

## 2020-03-19 DIAGNOSIS — R2689 Other abnormalities of gait and mobility: Secondary | ICD-10-CM | POA: Diagnosis not present

## 2020-03-19 DIAGNOSIS — G8929 Other chronic pain: Secondary | ICD-10-CM | POA: Insufficient documentation

## 2020-03-19 DIAGNOSIS — M545 Low back pain, unspecified: Secondary | ICD-10-CM

## 2020-03-19 NOTE — Therapy (Signed)
[place], [place], [address] Phone: [phone]   Fax:  [phone]  Physical Therapy Treatment / Discharge  Patient Details  Name: [name] MRN: [id number] Date of Birth: February 20, 1935 Referring Provider (PT): Gayland Curry, DO   Encounter Date: 03/19/2020   PT End of Session - 03/19/20 1222    Visit Number 13    Authorization Type MEDICARE PART A AND B    Progress Note Due on Visit 20    PT Start Time 1215    PT Stop Time 1300    PT Time Calculation (min) 45 min    Activity Tolerance Patient tolerated treatment well    Behavior During Therapy [facility] for tasks assessed/performed           Past Medical History:  Diagnosis Date  . Allergic rhinitis, cause unspecified   . Arthritis   . COPD (chronic obstructive pulmonary disease) ([place])   . Dyspnea   . Dysrhythmia    "1 episode of tachycardia in 1980's, been on it ever since"  . Emphysema of lung ([place])   . Hypertension   . Left inguinal hernia 02/07/2019  . Low hemoglobin    low level  . Neuromuscular disorder ([place])   . Neuropathy    feet  . Pneumonia    History of, last Oct 2019  . Pulmonary nodule     Past Surgical History:  Procedure Laterality Date  . APPENDECTOMY  1943  . CARDIAC CATHETERIZATION  2003   performed at [facility], Dr. Fransico Him  . CATARACT EXTRACTION  2008   Dr. Darleen Crocker  . CATARACT EXTRACTION, BILATERAL    . EYE SURGERY    . INGUINAL HERNIA REPAIR Left 02/07/2019   Procedure: OPEN REPAIR LEFT INGUINAL HERNIA WITH MESH;  Surgeon: Fanny Skates, MD;  Location: [facility];  Service: General;  Laterality: Left;  SPINAL AND TAP BLOCK ANESTHESIA  . ROTATOR CUFF REPAIR  2002   Dr. Kathryne Hitch  . TONSILLECTOMY  1945  . VASECTOMY      There were no vitals filed for this visit.   Subjective Assessment - 03/19/20 1213    Subjective Patient reports that he is doing well. He states he will have a conflict for next week  so is requesting this to be his final appointment.    How long can you sit comfortably? No limitation as long as soft chair    How long can you stand comfortably? 1 hour    How long can you walk comfortably? 10-15 minutes - mainly limited secondary to breathing    Patient Stated Goals Totally get off the walker, improve with using cane or transition off the cane, get more strength and improve balance    Currently in Pain? No/denies              [facility] PT Assessment - 03/19/20 0001      Assessment   Medical Diagnosis Low back pain    Referring Provider (PT) Gayland Curry, DO    Onset Date/Surgical Date 02/28/19    Hand Dominance Right    Next MD Visit 05/19/2020    Prior Therapy Yes - OPPT and pulmonary rehab      Precautions   Precautions Fall      Restrictions   Weight Bearing Restrictions No      Balance Screen   Has the patient fallen in the past 6 months No    Has the patient had  a decrease in activity level because of a fear of falling?  No    Is the patient reluctant to leave their home because of a fear of falling?  No      Prior Function   Level of Independence Independent with basic ADLs;Independent with household mobility without device;Independent with community mobility with device;Independent with transfers      Observation/Other Assessments   Focus on Therapeutic Outcomes (FOTO)  47% limitation      Transfers   Sit to Stand 7: Independent      Ambulation/Gait   Ambulation/Gait Yes    Ambulation/Gait Assistance 6: Modified independent (Device/Increase time)    Assistive device Straight cane    Gait Comments Patient continues to walk with cane in community, able to walk without AD in home      Standardized Balance Assessment   Five times sit to stand comments  14 sec without UE assist                         [facility] Adult PT Treatment/Exercise - 03/19/20 0001      Self-Care   Self-Care Other Self-Care Comments    Other Self-Care  Comments  FOTO, HEP consistency, POC      Neuro Re-ed    Neuro Re-ed Details  Tandem stance 3 x 30 sec      Exercises   Exercises Lumbar      Lumbar Exercises: Stretches   Passive Hamstring Stretch 30 seconds    Single Knee to Chest Stretch 30 seconds    Lower Trunk Rotation 3 reps;10 seconds    Piriformis Stretch 30 seconds    Figure 4 Stretch 30 seconds    Gastroc Stretch 30 seconds      Lumbar Exercises: Aerobic   Nustep L2 x 5 min (LE only)      Lumbar Exercises: Standing   Heel Raises 10 reps   2 sets   Other Standing Lumbar Exercises Standing marching 2x10      Lumbar Exercises: Seated   Sit to Stand 5 reps   2 sets   Sit to Stand Limitations 3 reps without UE , then 2 reps using hands on thighs       Lumbar Exercises: Supine   Clam 10 reps   2 sets   Clam Limitations green     Bridge 10 reps    Straight Leg Raise 10 reps   2 sets                 PT Education - 03/19/20 1222    Education Details HEP consistency    Person(s) Educated Patient    Methods Explanation    Comprehension Verbalized understanding            PT Short Term Goals - 02/27/20 1438      PT SHORT TERM GOAL #1   Title Patient will be I with initial HEP to progress in PT    Status Achieved      PT SHORT TERM GOAL #2   Title Patient will exhibit improved TUG to </=15 sec to improve mobility and reduce fall risk    Baseline 12.84 sec    Status Achieved      PT SHORT TERM GOAL #3   Title Patient will be able to perform STS without UE support to indicate improved LE strength    Baseline Patient able to perform  consecutive sit<>stand without UE assist    Status  Achieved             PT Long Term Goals - 03/19/20 1225      PT LONG TERM GOAL #1   Title Patient will be I with final HEP to maintain progress from PT    Baseline Patient is I with HEP - 03/19/2020    Time 4    Period Weeks    Status Achieved      PT LONG TERM GOAL #2   Title Patient will exhibit improved TUG  to < 13.5 sec to reduce fall risk    Baseline 12.84 sec    Status Achieved      PT LONG TERM GOAL #3   Title Patient will demonstrate improved strength and fall risk by performing 5xSTS in </= 14 sec without using UE support    Baseline 14 sec - 03/19/2020    Time 4    Period Weeks    Status Achieved      PT LONG TERM GOAL #4   Title Patient will report improved functional level to </= 46% limitation on FOTO    Baseline 47% limitation - 03/19/2020    Time 4    Period Weeks    Status Partially Met      PT LONG TERM GOAL #5   Title Patient will report </= 1/10 pain level with standing 30 minutes    Baseline Patient reports no real back pain with standing - 03/19/2020    Status Achieved                 Plan - 03/19/20 1223    Clinical Impression Statement Patient has moslty achieved all established goals and is independent with current HEP. He partially met his FOTO goal but reports most of his limitations in regard to function are breathing and pulmonary related rather than lower back pain. He continues to walk with a cane but states that he has been walking some inside without the cane. Patient feels he is ready to be independent with exercise so will be formally discharged from PT.    Personal Factors and Comorbidities Comorbidity 3+;Age;Fitness;Past/Current Experience;Time since onset of injury/illness/exacerbation    Comorbidities Former smoker on supplemental O2 2 - 4L, followed for COPD/emphysema, pulmonary nodules, hypoxic respiratory failure, complicated by CAD, Anemia    Examination-Activity Limitations Locomotion Level;Stand;Lift    Examination-Participation Restrictions Community Activity;Shop;Yard Work;Cleaning    PT Next Visit Plan NA - discharge    PT Home Exercise Plan [email or web address]: supine piriformis stretch, seated hamstring stretch, supine clam with green, bridge with green, SLR, standing marching, heel raises, sit<>stand, tandem balance    Consulted and Agree with Plan  of Care Patient           Patient will benefit from skilled therapeutic intervention in order to improve the following deficits and impairments:     Visit Diagnosis: Chronic bilateral low back pain without sciatica  Muscle weakness (generalized)  Other abnormalities of gait and mobility     Problem List Patient Active Problem List   Diagnosis Date Noted  . Benign prostatic hyperplasia with nocturia 09/20/2019  . Closed fracture of first lumbar vertebra ([place]) 09/20/2019  . Idiopathic progressive neuropathy 09/20/2019  . Other fatigue 09/20/2019  . Venous insufficiency of both lower extremities 09/20/2019  . Anemia 07/25/2019  . Left inguinal hernia 02/07/2019  . Left leg pain 05/18/2018  . Pneumonia 05/18/2018  . Chronic respiratory failure with hypoxia ([place]) 01/12/2015  . COPD with acute exacerbation ([place])  01/21/2013  . Hyperlipidemia 12/01/2011  . Hypertension 12/01/2011  . CAD (coronary artery disease) 05/23/2011  . Lung nodule 09/16/2008  . ALLERGIC RHINITIS 07/24/2007  . COPD mixed type ([place]) 07/24/2007  . BURSITIS 07/24/2007    PHYSICAL THERAPY DISCHARGE SUMMARY  Visits from Start of Care: 13  Current functional level related to goals / functional outcomes: See above   Remaining deficits: See above   Education / Equipment: HEP Plan: Patient agrees to discharge.  Patient goals were partially met. Patient is being discharged due to being pleased with the current functional level.  ?????     Hilda Blades, PT, DPT, LAT, ATC 03/19/20  1:04 PM Phone: [phone] Fax: [facility] [address] [place], [place], [address] Phone: [phone]   Fax:  [phone]  Name: [name] MRN: [id number] Date of Birth: 1935-03-27

## 2020-03-19 NOTE — Patient Instructions (Signed)
Access Code: [email or web address] URL: [email or web address] Date: 03/19/2020 Prepared by: Hilda Blades  Exercises Supine Piriformis Stretch with Foot on Ground - 2 x daily - 7 x weekly - 2 reps - 30 seconds hold Supine Lower Trunk Rotation - 2 x daily - 7 x weekly - 5 reps - 10 seconds hold Hooklying Clamshell with Resistance - 1-2 x daily - 7 x weekly - 2 sets - 10 reps Supine Bridge with Resistance Band - 1-2 x daily - 7 x weekly - 2 sets - 5-10 reps Supine Active Straight Leg Raise - 1-2 x daily - 7 x weekly - 2 sets - 5-10 reps Seated Hamstring Stretch - 2 x daily - 7 x weekly - 2 reps - 20 seconds hold Sit to Stand with Hands on Knees - 1-2 x daily - 7 x weekly - 3 sets - 5 reps Standing March with Counter Support - 1-2 x daily - 7 x weekly - 2 sets - 10 reps Heel rises with counter support - 1-2 x daily - 7 x weekly - 2 sets - 10 reps Standing Tandem Balance with Counter Support - 1-2 x daily - 7 x weekly - 3 reps - 30 seconds hold

## 2020-03-25 ENCOUNTER — Ambulatory Visit: Payer: Medicare Other | Admitting: Physical Therapy

## 2020-03-26 ENCOUNTER — Other Ambulatory Visit: Payer: Self-pay

## 2020-03-26 ENCOUNTER — Ambulatory Visit (INDEPENDENT_AMBULATORY_CARE_PROVIDER_SITE_OTHER): Payer: Medicare Other | Admitting: Internal Medicine

## 2020-03-26 ENCOUNTER — Encounter: Payer: Self-pay | Admitting: Internal Medicine

## 2020-03-26 DIAGNOSIS — J449 Chronic obstructive pulmonary disease, unspecified: Secondary | ICD-10-CM | POA: Diagnosis not present

## 2020-03-26 DIAGNOSIS — I25119 Atherosclerotic heart disease of native coronary artery with unspecified angina pectoris: Secondary | ICD-10-CM

## 2020-03-26 MED ORDER — SPIRIVA RESPIMAT 2.5 MCG/ACT IN AERS: 2.0000 | INHALATION_SPRAY | Freq: Every day | RESPIRATORY_TRACT | 12 refills | Status: DC

## 2020-03-26 MED ORDER — BUDESONIDE-FORMOTEROL FUMARATE 160-4.5 MCG/ACT IN AERO
INHALATION_SPRAY | RESPIRATORY_TRACT | 12 refills | Status: DC
Start: 2020-03-26 — End: 2020-05-19

## 2020-03-26 MED ORDER — SPIRIVA RESPIMAT 2.5 MCG/ACT IN AERS: 2.0000 | INHALATION_SPRAY | Freq: Every day | RESPIRATORY_TRACT | 0 refills | Status: DC

## 2020-03-26 NOTE — Assessment & Plan Note (Signed)
He is managing fairly well given his age and the severity of his lung disease. Loss of muscle mass contributes to DOE. Plan- ok to change back from [facility] to [name] which he preferred, even though chemistry is the same.

## 2020-03-26 NOTE — Patient Instructions (Signed)
We are changing back from [place] to [employer] sent  Sample x 2 Spiriva 2.5 Respimat   Inhale 2 puffs once daily  Please call if we can help

## 2020-03-26 NOTE — Progress Notes (Signed)
Patient ID: [name], male    DOB: 12-13-1934, 85 y.o.   MRN: [id number] PCP Dr Hulan Fess  HPI male former smoker followed for COPD/emphysema, pulmonary nodules, hypoxic respiratory failure, complicated by CAD D6QI 2/97/98-  MM PFT 04/27/11- Echocardiogram 07/31/2018-EF 55-60%, grade 1 DD, PHN 49 mmHg ------------------------------------------------------------------------------   11/22/19-  85 year old male former smoker followed for COPD/emphysema, pulmonary nodules, hypoxic respiratory failure, complicated by CAD, Anemia,  O2 3 - 4L/[employer] Helpful message from [facility] nurse noting that oximetry records much lower on his finger than w forehead probe. Pt was resistant to trying lower O2 settings until he could discuss with [place].  -----f/u COPD O2 4l/min O2 sat finger 99% on 4 L    Cold fingers Forehead sat 100% Had 2 Covax Symbicort 160, Spiriva 2.5,  Thoracic vertebral fx after a fall. Pain gone but still unstable, using walker. More DOE since then. Didn't like Trelegy, but says sample Breztri worked better than Symbicort. Explained that he can get SOB without desaturating- lower O2 can be appropriate.  On Vit B12 for anemia.  Checked O2 sat here with simultaneous forehead probe ((100%) and finger probe       ( 98%) on 2L.   03/26/20- 85 year old male former smoker followed for COPD/emphysema, pulmonary nodules, hypoxic respiratory failure, complicated by CAD, Anemia,  O2 3 - 4L/[employer] Had 2 [employer] covax Breztri, Spiriva 2.5, No acute issues, but he feels he never recovered from the increase in DOE noted after his fall. He didn't fel he was getting much benefit from returned to Pulmonary Rehab, so he switched to general outpatient PT exercise class and is doing better.  He would like to change back from [facility] to [name], which seemed to help more although chemistry is the same.  CXR 11/22/19-  COPD without acute abnormality.  Review of Systems-see HPI   + =  positive Constitutional:   No-   weight loss, night sweats fevers, no-chills, fatigue, lassitude. HEENT:   No-  headaches, difficulty swallowing, tooth/dental problems, sore throat,       No-  sneezing, itching, ear ache, nasal congestion, post nasal drip,  CV:  No-chest pain, no-orthopnea, PND, No-swelling in lower extremities, anasarca, dizziness, palpitations Resp: + shortness of breath with exertion, not at rest.               cough,  + non-productive cough,  No-  coughing up of blood.              No change color of mucus.  No- wheezing.   Skin: No-   rash or lesions. GI:  No-   heartburn, indigestion, abdominal pain, nausea,   GU: No-   dysuria,  MS:  No-   joint pain or swelling.  Neuro-  Psych:  No- change in mood or affect. No acute depression or anxiety.  No memory loss.  OBJ-    + looks frail General- Alert, Oriented, Affect-appropriate, Distress- none acute. + O2 4, slender Skin-  Lymphadenopathy- none Head- atraumatic            Eyes- Gross vision intact, PERRLA, conjunctivae clear secretions            Ears- Hearing, canals-normal            Nose- Clear, no-Septal dev, mucus, polyps, erosion, perforation             Throat- Mallampati II , mucosa clear , drainage- none, tonsils- atrophic.  Neck- flexible ,  trachea midline, no stridor , thyroid nl, carotid no bruit Chest - symmetrical excursion , unlabored           Heart/CV- RRR , no murmur , no gallop  , no rub, nl s1 s2                           - JVD- none , edema-none, stasis changes- none, varices- none           Lung-  Distant+, wheeze -none, unlabored, cough-none, dullness-none, rub- none           Chest wall- +Back brace Abd-  Br/ Gen/ Rectal- Not done, not indicated Extrem- cyanosis- none, clubbing, none, atrophy- none, strength- nl. +RLE wrapped Neuro- grossly intact to observation

## 2020-03-31 NOTE — Telephone Encounter (Signed)
Message Routed to Dr. Mariea Clonts MD

## 2020-04-15 DIAGNOSIS — Z23 Encounter for immunization: Secondary | ICD-10-CM | POA: Diagnosis not present

## 2020-04-17 ENCOUNTER — Encounter: Payer: Self-pay | Admitting: Internal Medicine

## 2020-04-26 ENCOUNTER — Other Ambulatory Visit: Payer: Self-pay | Admitting: Internal Medicine

## 2020-05-06 ENCOUNTER — Encounter: Payer: Self-pay | Admitting: Internal Medicine

## 2020-05-10 DIAGNOSIS — Z23 Encounter for immunization: Secondary | ICD-10-CM | POA: Diagnosis not present

## 2020-05-14 ENCOUNTER — Other Ambulatory Visit: Payer: Self-pay

## 2020-05-14 ENCOUNTER — Other Ambulatory Visit: Payer: Medicare Other

## 2020-05-14 DIAGNOSIS — D649 Anemia, unspecified: Secondary | ICD-10-CM | POA: Diagnosis not present

## 2020-05-15 ENCOUNTER — Other Ambulatory Visit: Payer: Self-pay

## 2020-05-15 DIAGNOSIS — N401 Enlarged prostate with lower urinary tract symptoms: Secondary | ICD-10-CM | POA: Diagnosis not present

## 2020-05-15 DIAGNOSIS — R351 Nocturia: Secondary | ICD-10-CM | POA: Diagnosis not present

## 2020-05-15 LAB — BASIC METABOLIC PANEL
BUN/Creatinine Ratio: 27 (calc) — ABNORMAL HIGH (ref 6–22)
BUN: 15 mg/dL (ref 7–25)
CO2: 30 mmol/L (ref 20–32)
Calcium: 9.5 mg/dL (ref 8.6–10.3)
Chloride: 99 mmol/L (ref 98–110)
Creat: 0.56 mg/dL — ABNORMAL LOW (ref 0.70–1.11)
Glucose, Bld: 102 mg/dL — ABNORMAL HIGH (ref 65–99)
Potassium: 4.5 mmol/L (ref 3.5–5.3)
Sodium: 137 mmol/L (ref 135–146)

## 2020-05-15 LAB — CBC WITH DIFFERENTIAL/PLATELET
Absolute Monocytes: 410 cells/uL (ref 200–950)
Basophils Absolute: 43 cells/uL (ref 0–200)
Basophils Relative: 0.6 %
Eosinophils Absolute: 173 cells/uL (ref 15–500)
Eosinophils Relative: 2.4 %
HCT: 38.4 % — ABNORMAL LOW (ref 38.5–50.0)
Hemoglobin: 12.5 g/dL — ABNORMAL LOW (ref 13.2–17.1)
Lymphs Abs: 871 cells/uL (ref 850–3900)
MCH: 29.1 pg (ref 27.0–33.0)
MCHC: 32.6 g/dL (ref 32.0–36.0)
MCV: 89.3 fL (ref 80.0–100.0)
MPV: 10.5 fL (ref 7.5–12.5)
Monocytes Relative: 5.7 %
Neutro Abs: 5702 cells/uL (ref 1500–7800)
Neutrophils Relative %: 79.2 %
Platelets: 203 10*3/uL (ref 140–400)
RBC: 4.3 10*6/uL (ref 4.20–5.80)
RDW: 12.4 % (ref 11.0–15.0)
Total Lymphocyte: 12.1 %
WBC: 7.2 10*3/uL (ref 3.8–10.8)

## 2020-05-15 LAB — PSA: PSA: 3.46 ng/mL (ref <=4.0)

## 2020-05-15 NOTE — Progress Notes (Signed)
Anemia is stable.  Kidneys are also stable.  Electrolytes normal.

## 2020-05-19 ENCOUNTER — Ambulatory Visit (INDEPENDENT_AMBULATORY_CARE_PROVIDER_SITE_OTHER): Payer: Medicare Other | Admitting: Internal Medicine

## 2020-05-19 ENCOUNTER — Encounter: Payer: Self-pay | Admitting: Internal Medicine

## 2020-05-19 ENCOUNTER — Other Ambulatory Visit: Payer: Self-pay

## 2020-05-19 VITALS — BP 130/70 | HR 73 | Temp 97.8°F | Ht 69.0 in | Wt 140.0 lb

## 2020-05-19 DIAGNOSIS — N401 Enlarged prostate with lower urinary tract symptoms: Secondary | ICD-10-CM | POA: Diagnosis not present

## 2020-05-19 DIAGNOSIS — R351 Nocturia: Secondary | ICD-10-CM | POA: Diagnosis not present

## 2020-05-19 DIAGNOSIS — J9611 Chronic respiratory failure with hypoxia: Secondary | ICD-10-CM | POA: Diagnosis not present

## 2020-05-19 DIAGNOSIS — I25119 Atherosclerotic heart disease of native coronary artery with unspecified angina pectoris: Secondary | ICD-10-CM | POA: Diagnosis not present

## 2020-05-19 DIAGNOSIS — J449 Chronic obstructive pulmonary disease, unspecified: Secondary | ICD-10-CM

## 2020-05-19 DIAGNOSIS — Z789 Other specified health status: Secondary | ICD-10-CM

## 2020-05-19 DIAGNOSIS — R5382 Chronic fatigue, unspecified: Secondary | ICD-10-CM | POA: Diagnosis not present

## 2020-05-19 NOTE — Progress Notes (Signed)
Location:  [facility] clinic Provider:  Stokes Rattigan L. Mariea Clonts, D.O., C.M.D.  Code Status: DNR Goals of Care:  Advanced Directives 05/19/2020  Does Patient Have a Medical Advance Directive? Yes  Type of Advance Directive [place]  Does patient want to make changes to medical advance directive? No - Patient declined  Copy of [place] in Chart? Yes - validated most recent copy scanned in chart (See row information)  Would patient like information on creating a medical advance directive? -   Chief Complaint  Patient presents with  . Medical Management of Chronic Issues    4 month follow up visit. No new concerns.    HPI: Patient is a 85 y.o. male seen today for medical management of chronic diseases.    He PSAs historically were actually higher than his recent reading of 3.46.   11/02:  4.7 4/07 4.9 6/08 3.84 2/09 6.1 (thinks had biopsy) 4/13 3.9  4/15 5.15   Booster received at [facility].   Flu given 8/31.  He feels about the same with energy, fatigue.  Breathing still less than it was before he fell.  He's taking the b12 DAILY and not making a difference.    He still has some morning headaches.  He had seen the spinal doctor after he fell.  He'd been told his neck was a mess with the arthritis.  He hears a noise when he turns his head at times.  He takes his 2 tylenol arthritis which does take care of it.  His head almost feels not right.  Blames the fatigue and tiredness.  Not congested.  When has head on the pillow, he might feel pressure in his ears, if he swallows, it relieves it.  He does not sleep well.  He also has to pee numerous times overnight.  The myrbetriq at 25 and even 50mg  did not help at all.  He came off of it and it was a little better with frequency.  Usually goes 2x, then awakens at [place] before plans to get up.  He does not snore.    His biggest gripe is feeling tired all of the time.  Explained that it's his lungs and COPD.    [facility]  can no longer provide care outside of [place].  He's been in touch with [facility].  They've come out and done an assessment.  There is someone to come in Mondays and Fridays 4 hrs each day starting this coming fri.  Izora Gala has long-term care policy and they're hoping to get some coverage for her.   They are meeting with a woman who assists with senior living arrangements--Janice Burns.  They are looking at [facility] as a possible place to consider living.  It does not require the upfront cost.    He still has the neuropathy issue--he tried capsaicin--did not do much and he struggled to get it on his feet.    Past Medical History:  Diagnosis Date  . Allergic rhinitis, cause unspecified   . Arthritis   . COPD (chronic obstructive pulmonary disease) ([place])   . Dyspnea   . Dysrhythmia    "1 episode of tachycardia in 1980's, been on it ever since"  . Emphysema of lung ([place])   . Hypertension   . Left inguinal hernia 02/07/2019  . Low hemoglobin    low level  . Neuromuscular disorder ([place])   . Neuropathy    feet  . Pneumonia    History of, last Oct  2019  . Pulmonary nodule     Past Surgical History:  Procedure Laterality Date  . APPENDECTOMY  1943  . CARDIAC CATHETERIZATION  2003   performed at [facility], Dr. Fransico Him  . CATARACT EXTRACTION  2008   Dr. Darleen Crocker  . CATARACT EXTRACTION, BILATERAL    . EYE SURGERY    . INGUINAL HERNIA REPAIR Left 02/07/2019   Procedure: OPEN REPAIR LEFT INGUINAL HERNIA WITH MESH;  Surgeon: Fanny Skates, MD;  Location: [place];  Service: General;  Laterality: Left;  SPINAL AND TAP BLOCK ANESTHESIA  . ROTATOR CUFF REPAIR  2002   Dr. Kathryne Hitch  . TONSILLECTOMY  1945  . VASECTOMY      Allergies  Allergen Reactions  . Tolectin [Tolmetin Sodium] Other (See Comments)    BP low and passed out  . Montelukast Sodium Other (See Comments)    Unknown rxn;  . Nsaids   . Augmentin [Amoxicillin-Pot Clavulanate] Nausea And Vomiting    Did it involve  swelling of the face/tongue/throat, SOB, or low BP? No Did it involve sudden or severe rash/hives, skin peeling, or any reaction on the inside of your mouth or nose? No Did you need to seek medical attention at a hospital or doctor's office? No When did it last happen?Last year If all above answers are "NO", may proceed with cephalosporin use.  . Neosporin [Bacitracin-Polymyxin B] Rash    Outpatient Encounter Medications as of 05/19/2020  Medication Sig  . acetaminophen (TYLENOL) 650 MG CR tablet Take 650 mg by mouth daily. 2 tablets in the morning  . aspirin EC 81 MG tablet Take 1 tablet (81 mg total) by mouth daily.  [name] BREZTRI AEROSPHERE 160-9-4.8 MCG/ACT AERO INHALE 2 PUFFS INTO THE LUNGS IN THE MORNING AND AT BEDTIME  . ketoconazole (NIZORAL) 2 % shampoo SMARTSIG:5 Milliliter(s) Topical Daily  . losartan (COZAAR) 25 MG tablet TAKE 1 TABLET(25 MG) BY MOUTH DAILY  . OXYGEN Inhale 3-6 L into the lungs continuous.   . polyethylene glycol powder (GLYCOLAX/MIRALAX) powder Take 17 g by mouth daily.  . pravastatin (PRAVACHOL) 40 MG tablet TAKE 1 TABLET(40 MG) BY MOUTH DAILY  . protein supplement shake (PREMIER PROTEIN) LIQD Take 2 oz by mouth daily.  . tamsulosin (FLOMAX) 0.4 MG CAPS capsule TAKE 1 CAPSULE(0.4 MG) BY MOUTH DAILY  . vitamin B-12 (CYANOCOBALAMIN) 1000 MCG tablet Take 1 tablet (1,000 mcg total) by mouth daily.  . [DISCONTINUED] acetaminophen (TYLENOL) 500 MG tablet Take 500 mg by mouth See admin instructions. 2 - 4 times a day  . [DISCONTINUED] budesonide-formoterol (SYMBICORT) 160-4.5 MCG/ACT inhaler Inhale 2 puffs then rinse mouth, twice daily  . [DISCONTINUED] digoxin (LANOXIN) 0.25 MG tablet Take 250 mcg by mouth daily.  . [DISCONTINUED] mirabegron ER (MYRBETRIQ) 25 MG TB24 tablet Take 25 mg by mouth daily.  . [DISCONTINUED] Nutritional Supplements (ENSURE HIGH PROTEIN) LIQD Take 1 Bottle by mouth daily.  . [DISCONTINUED] Tiotropium Bromide Monohydrate (SPIRIVA RESPIMAT) 2.5  MCG/ACT AERS Inhale 2 puffs into the lungs daily.  . [DISCONTINUED] Tiotropium Bromide Monohydrate (SPIRIVA RESPIMAT) 2.5 MCG/ACT AERS Inhale 2 puffs into the lungs daily.   No facility-administered encounter medications on file as of 05/19/2020.    Review of Systems:  Review of Systems  Constitutional: Positive for malaise/fatigue. Negative for chills and fever.  HENT: Negative for congestion.   Eyes: Negative for blurred vision.  Respiratory: Positive for shortness of breath and wheezing. Negative for cough and sputum production.   Cardiovascular: Negative for chest pain  and palpitations.  Gastrointestinal: Negative for abdominal pain, blood in stool, constipation and melena.  Genitourinary: Positive for frequency. Negative for dysuria.  Musculoskeletal: Negative for falls and joint pain.  Neurological: Negative for dizziness and loss of consciousness.  Endo/Heme/Allergies: Bruises/bleeds easily.  Psychiatric/Behavioral: Negative for depression and memory loss. The patient is not nervous/anxious and does not have insomnia.     Health Maintenance  Topic Date Due  . TETANUS/TDAP  08/25/2020  . INFLUENZA VACCINE  Completed  . COVID-19 Vaccine  Completed  . PNA vac Low Risk Adult  Completed    Physical Exam: Vitals:   05/19/20 1306  BP: 130/70  Pulse: 73  Temp: 97.8 F (36.6 C)  SpO2: 96%  Weight: 140 lb (63.5 kg)  Height: 5\' 9"  (1.753 m)   Body mass index is 20.67 kg/m. Physical Exam Constitutional:      General: He is not in acute distress.    Appearance: Normal appearance. He is not toxic-appearing.     Comments: Thin male  HENT:     Head: Normocephalic and atraumatic.  Eyes:     Comments: glasses  Cardiovascular:     Rate and Rhythm: Normal rate and regular rhythm.  Pulmonary:     Effort: Pulmonary effort is normal. No respiratory distress.     Breath sounds: Wheezing present. No rhonchi.     Comments: Using 3L via [place] Abdominal:     General: Bowel sounds are  normal.     Palpations: Abdomen is soft.     Tenderness: There is no abdominal tenderness.  Musculoskeletal:        General: Normal range of motion.     Cervical back: Neck supple.     Right lower leg: Edema present.     Left lower leg: Edema present.  Skin:    General: Skin is warm and dry.     Findings: Bruising present.  Neurological:     General: No focal deficit present.     Mental Status: He is alert and oriented to person, place, and time.     Motor: No weakness.     Gait: Gait abnormal (uses cane).  Psychiatric:        Mood and Affect: Mood normal.        Behavior: Behavior normal.        Thought Content: Thought content normal.        Judgment: Judgment normal.     Labs reviewed: Basic Metabolic Panel: Recent Labs    07/23/19 0000 01/18/20 1037 05/14/20 1006  NA 137 136 137  K 5.0 4.7 4.5  CL 97* 99 99  CO2 36* 32 30  GLUCOSE  --  100* 102*  BUN 15 15 15   CREATININE 0.4* 0.49* 0.56*  CALCIUM 9.4 9.4 9.5   Liver Function Tests: Recent Labs    07/23/19 0000 01/18/20 1037  AST  --  17  ALT  --  15  BILITOT  --  0.7  PROT  --  6.2  ALBUMIN 3.9  --    No results for input(s): LIPASE, AMYLASE in the last 8760 hours. No results for input(s): AMMONIA in the last 8760 hours. CBC: Recent Labs    06/11/19 1213 06/11/19 1213 08/02/19 0000 01/18/20 1037 05/14/20 1006  WBC 9.2  --  8.7 7.0 7.2  NEUTROABS 8.1*  --   --  5,579 5,702  HGB 11.8*   < > 12.5* 12.2* 12.5*  HCT 37.2*  36.4*   < > 37*  37.7* 38.4*  MCV 90.7  --   --  89.3 89.3  PLT 226   < > 234 217 203   < > = values in this interval not displayed.   Lipid Panel: Recent Labs    01/18/20 1037  CHOL 171  HDL 86  LDLCALC 73  TRIG 41  CHOLHDL 2.0    Assessment/Plan 1. Chronic fatigue -remains, cont B12, unfortunately, this seems to be due to his lung disease primarily   2. Benign prostatic hyperplasia with nocturia -PSA actually improved over time, cont flomax, was not helped by  myrbetriq therapy  3. Chronic respiratory failure with hypoxia (HCC) -continue oxygen therapy  4. COPD mixed type ([place]) -cont current mgt with O2 and breztri  5. Full code status - Full code--he is thinking about this after our discussion today--16 mins spent on this topic  Labs/tests ordered:  No new Next appt:  09/25/2020  Kenedee Molesky L. Keily Lepp, D.O. [facility] [address], [place] [address] Cell Phone (Mon-Fri 8am-5pm):  [phone] On Call:  [phone] & follow prompts after 5pm & weekends Office Phone:  [phone] Office Fax:  [phone]

## 2020-05-20 ENCOUNTER — Encounter: Payer: Self-pay | Admitting: Internal Medicine

## 2020-05-28 NOTE — Telephone Encounter (Signed)
Dr. Annamaria Boots, please see pt's mychart message and advise.   To: [facility] POOL    From: [name] Grice "[name]"    Created: 05/28/2020 3:06 PM     *-*-*This message was handled on 05/28/2020 4:14 PM by Myrle Sheng, Shayda Kalka P*-*-*  My primary care doctor is seeing me every 4 months.  That puts my next appointment with her the day after my February 9 appointment with you.  I'd rather have more time between appointments.  Should I see you a month or two earlier than Feb. 9,  or a month or two after Feb. 9?

## 2020-05-29 NOTE — Telephone Encounter (Signed)
Please reschedule [name]'s return ov a couple of months later, or whatever best meets his needs.

## 2020-07-25 ENCOUNTER — Other Ambulatory Visit: Payer: Self-pay | Admitting: Cardiovascular Disease

## 2020-08-04 ENCOUNTER — Telehealth: Payer: Self-pay | Admitting: Internal Medicine

## 2020-08-04 NOTE — Telephone Encounter (Signed)
Pharmacy name: [facility] requested: Breztri CMM?: yes Key: [id number]  Covered alternatives:  Tried and failed: on file Decision: approved through 08/15/2021

## 2020-08-20 NOTE — Progress Notes (Signed)
Patient ID: [name], male    DOB: 02-08-1935, 86 y.o.   MRN: [id number] PCP Dr Hulan Fess  HPI male former smoker followed for COPD/emphysema, pulmonary nodules, hypoxic respiratory failure, complicated by CAD [id number]  MM PFT 04/27/11- Echocardiogram 07/31/2018-EF 55-60%, grade 1 DD, PHN 49 mmHg ------------------------------------------------------------------------------  03/26/20- 86 year old male former smoker followed for COPD/emphysema, pulmonary nodules, hypoxic respiratory failure, complicated by CAD, Anemia,  O2 3 - 4L/[employer] Had 2 Phizer covax Breztri, Spiriva 2.5, No acute issues, but he feels he never recovered from the increase in DOE noted after his fall. He didn't fel he was getting much benefit from returned to Pulmonary Rehab, so he switched to general outpatient PT exercise class and is doing better.  He would like to change back from [place] to [name], which seemed to help more although chemistry is the same.  CXR 11/22/19-  COPD without acute abnormality.  08/21/20-  86 year old male former smoker followed for COPD/emphysema, pulmonary nodules, hypoxic respiratory failure, complicated by CAD, Anemia,  O2 3 - 4L/[employer] Breztri, Spiriva 2.5- using up remainder Covid vax-3 Phizer Flu vax- had -----Patient feels he is about the same since visit maybe a little better.  Denies cough Discussed meds and I tol him it would be ok to use up his remaining Spiriva once daily, in between doses of Breztri, if he wanted to try it. Little cough and no acute issues.  Physical Therapy helped some, but feels he never regained stamina he had before he fell about 1.5 yrs ago.  Review of Systems-see HPI   + = positive Constitutional:   No-   weight loss, night sweats fevers, no-chills, fatigue, lassitude. HEENT:   No-  headaches, difficulty swallowing, tooth/dental problems, sore throat,       No-  sneezing, itching, ear ache, nasal congestion, post nasal drip,   CV:  No-chest pain, no-orthopnea, PND, No-swelling in lower extremities, anasarca, dizziness, palpitations Resp: + shortness of breath with exertion, not at rest.               cough,  + non-productive cough,  No-  coughing up of blood.              No change color of mucus.  No- wheezing.   Skin: No-   rash or lesions. GI:  No-   heartburn, indigestion, abdominal pain, nausea,   GU: No-   dysuria,  MS:  No-   joint pain or swelling.  Neuro-  Psych:  No- change in mood or affect. No acute depression or anxiety.  No memory loss.  OBJ-    + looks frail General- Alert, Oriented, Affect-appropriate, Distress- none acute. + O2 2, slender Skin-  Lymphadenopathy- none Head- atraumatic            Eyes- Gross vision intact, PERRLA, conjunctivae clear secretions            Ears- Hearing, canals-normal            Nose- Clear, no-Septal dev, mucus, polyps, erosion, perforation             Throat- Mallampati II , mucosa clear , drainage- none, tonsils- atrophic.  Neck- flexible , trachea midline, no stridor , thyroid nl, carotid no bruit Chest - symmetrical excursion , unlabored           Heart/CV- RRR , no murmur , no gallop  , no rub, nl s1 s2                           -  JVD- none , edema-none, stasis changes- none, varices- none           Lung-  Distant+, wheeze -none, unlabored, cough-none, dullness-none, rub- none           Chest wall- +Back brace Abd-  Br/ Gen/ Rectal- Not done, not indicated Extrem- cyanosis- none, clubbing, none, atrophy- none, strength- nl. +RLE wrapped Neuro- grossly intact to observation

## 2020-08-21 ENCOUNTER — Other Ambulatory Visit: Payer: Self-pay

## 2020-08-21 ENCOUNTER — Encounter: Payer: Self-pay | Admitting: Internal Medicine

## 2020-08-21 ENCOUNTER — Ambulatory Visit (INDEPENDENT_AMBULATORY_CARE_PROVIDER_SITE_OTHER): Payer: Medicare Other | Admitting: Internal Medicine

## 2020-08-21 DIAGNOSIS — J9611 Chronic respiratory failure with hypoxia: Secondary | ICD-10-CM

## 2020-08-21 DIAGNOSIS — J449 Chronic obstructive pulmonary disease, unspecified: Secondary | ICD-10-CM

## 2020-08-21 NOTE — Patient Instructions (Signed)
We can continue current meds  Please call if we can help 

## 2020-08-22 NOTE — Assessment & Plan Note (Signed)
Now mostly pure emphysema with little daily cough or phlegm. No recent exacerbation and has not had covid infection.  Plan- continue [employer]

## 2020-08-22 NOTE — Assessment & Plan Note (Signed)
He adjusts O2 appropriately and manages between [place] home concentrator. Plan- continue present systems

## 2020-08-25 ENCOUNTER — Other Ambulatory Visit: Payer: Self-pay

## 2020-08-25 MED ORDER — BREZTRI AEROSPHERE 160-9-4.8 MCG/ACT IN AERO
INHALATION_SPRAY | RESPIRATORY_TRACT | 5 refills | Status: DC
Start: 2020-08-25 — End: 2020-09-12

## 2020-08-25 MED ORDER — LOSARTAN POTASSIUM 25 MG PO TABS
25.0000 mg | ORAL_TABLET | Freq: Every day | ORAL | 1 refills | Status: DC
Start: 2020-08-25 — End: 2021-08-21

## 2020-08-25 MED ORDER — TAMSULOSIN HCL 0.4 MG PO CAPS
ORAL_CAPSULE | ORAL | 1 refills | Status: DC
Start: 2020-08-25 — End: 2020-10-23

## 2020-08-25 MED ORDER — PRAVASTATIN SODIUM 40 MG PO TABS
ORAL_TABLET | ORAL | 1 refills | Status: DC
Start: 2020-08-25 — End: 2020-10-23

## 2020-08-25 NOTE — Telephone Encounter (Signed)
Incoming call received from patient requesting refill on 4 medications. Patient would like rx's sent to [facility] pharmacy[name] [address] .   Patient was informed that I will need to get consent from Dr.Reed on filling Losartan as it appears that medication is managed by his cardiologist. I will also need consent on Bretriz as it does not show a fill history under Dr.Reed.  Patient states it would be nice if Dr.Reed could fill all four  Please advise

## 2020-08-26 ENCOUNTER — Telehealth: Payer: Self-pay

## 2020-08-26 NOTE — Telephone Encounter (Signed)
Patient called to verify if his medication had been sent to pharmacy yesterday.

## 2020-09-12 ENCOUNTER — Telehealth: Payer: Self-pay | Admitting: Internal Medicine

## 2020-09-12 MED ORDER — BREZTRI AEROSPHERE 160-9-4.8 MCG/ACT IN AERO: INHALATION_SPRAY | RESPIRATORY_TRACT | 11 refills | Status: DC

## 2020-09-12 MED ORDER — ALBUTEROL SULFATE HFA 108 (90 BASE) MCG/ACT IN AERS: 2.0000 | INHALATION_SPRAY | Freq: Four times a day (QID) | RESPIRATORY_TRACT | 11 refills | Status: DC | PRN

## 2020-09-12 NOTE — Telephone Encounter (Signed)
[facility] needs refills on [place] and albuterol.  These were sent as requested.  Nothing further needed at this time- will close encounter.

## 2020-09-24 ENCOUNTER — Ambulatory Visit: Payer: Medicare Other | Admitting: Internal Medicine

## 2020-09-25 ENCOUNTER — Other Ambulatory Visit: Payer: Self-pay

## 2020-09-25 ENCOUNTER — Encounter: Payer: Self-pay | Admitting: Internal Medicine

## 2020-09-25 ENCOUNTER — Ambulatory Visit (INDEPENDENT_AMBULATORY_CARE_PROVIDER_SITE_OTHER): Payer: Medicare Other | Admitting: Internal Medicine

## 2020-09-25 VITALS — BP 138/72 | HR 102 | Temp 97.7°F | Ht 68.0 in | Wt 139.8 lb

## 2020-09-25 DIAGNOSIS — I872 Venous insufficiency (chronic) (peripheral): Secondary | ICD-10-CM

## 2020-09-25 DIAGNOSIS — Z23 Encounter for immunization: Secondary | ICD-10-CM | POA: Diagnosis not present

## 2020-09-25 DIAGNOSIS — G603 Idiopathic progressive neuropathy: Secondary | ICD-10-CM | POA: Diagnosis not present

## 2020-09-25 DIAGNOSIS — J449 Chronic obstructive pulmonary disease, unspecified: Secondary | ICD-10-CM | POA: Diagnosis not present

## 2020-09-25 DIAGNOSIS — J9611 Chronic respiratory failure with hypoxia: Secondary | ICD-10-CM

## 2020-09-25 MED ORDER — DULOXETINE HCL 20 MG PO CPEP: 20.0000 mg | ORAL_CAPSULE | Freq: Every day | ORAL | 3 refills | Status: DC

## 2020-09-25 NOTE — Progress Notes (Signed)
Location:  [facility] clinic Provider:  Daemyn Gariepy L. Mariea Clonts, D.O., C.M.D.  Goals of Care:  Advanced Directives 09/25/2020  Does Patient Have a Medical Advance Directive? Yes  Type of Paramedic of [place];Living will  Does patient want to make changes to medical advance directive? No - Patient declined  Copy of [place] in Chart? Yes - validated most recent copy scanned in chart (See row information)  Would patient like information on creating a medical advance directive? -   Chief Complaint  Patient presents with  . Medical Management of Chronic Issues    4 month follow up. Discuss need for Tetanus.    HPI: Patient is a 86 y.o. male seen today for medical management of chronic diseases.    He's about the same as he's been.    Breathing is still a real difficulty.  Energy level might be a little better.  Thinks he is sleeping better.  He's bringing up a lot of mucus in the last couple of months.  He started on mucinex since yesterday to see if that helps much.  Dry mouth and sore throat that are ongoing.  It's very slight.    He has started having a nervous twitch where he moves his tongue around in his mouth.  Has cold fingers and occasionally a totally white finger.  Discussed Raynauds'.  He's had a very slight left lower quadrant abdominal pain kind of in the area where he had the hernia. No return of swelling.  He feels like the muscle there is sore.  Takes miralax every day and has not had constipation at all.  He notes he has such chronic respiratory symptoms, that he feels like he wouldn't know if he got covid.    He would not want to be resuscitated if in the opinion of medical people he would not recover, he would just linger, or he'd be paralyzed.  Discussed idea of palliative care with him.  He c/o neuropathy pain in his right lateral foot.  He was on gabapentin and vitamin C and B complex which didn't do anything at that point with  ortho.  Both feet do bother him--pins and needles and numbness.  It does not disrupt his sleep that he's aware of.  Sometimes repositioning works.    His back is not bad lately--only with prolonged standing.  Past Medical History:  Diagnosis Date  . Allergic rhinitis, cause unspecified   . Arthritis   . COPD (chronic obstructive pulmonary disease) ([place])   . Dyspnea   . Dysrhythmia    "1 episode of tachycardia in 1980's, been on it ever since"  . Emphysema of lung ([place])   . Hypertension   . Left inguinal hernia 02/07/2019  . Low hemoglobin    low level  . Neuromuscular disorder ([place])   . Neuropathy    feet  . Pneumonia    History of, last Oct 2019  . Pulmonary nodule     Past Surgical History:  Procedure Laterality Date  . APPENDECTOMY  1943  . CARDIAC CATHETERIZATION  2003   performed at [facility], Dr. Fransico Him  . CATARACT EXTRACTION  2008   Dr. Darleen Crocker  . CATARACT EXTRACTION, BILATERAL    . EYE SURGERY    . INGUINAL HERNIA REPAIR Left 02/07/2019   Procedure: OPEN REPAIR LEFT INGUINAL HERNIA WITH MESH;  Surgeon: Fanny Skates, MD;  Location: [place];  Service: General;  Laterality: Left;  SPINAL AND TAP BLOCK  ANESTHESIA  . ROTATOR CUFF REPAIR  2002   Dr. Kathryne Hitch  . TONSILLECTOMY  1945  . VASECTOMY      Allergies  Allergen Reactions  . Tolectin [Tolmetin Sodium] Other (See Comments)    BP low and passed out  . Montelukast Sodium Other (See Comments)    Unknown rxn;  . Nsaids   . Other Other (See Comments)  . Augmentin [Amoxicillin-Pot Clavulanate] Nausea And Vomiting    Did it involve swelling of the face/tongue/throat, SOB, or low BP? No Did it involve sudden or severe rash/hives, skin peeling, or any reaction on the inside of your mouth or nose? No Did you need to seek medical attention at a hospital or doctor's office? No When did it last happen?Last year If all above answers are "NO", may proceed with cephalosporin use.  . Neosporin  [Bacitracin-Polymyxin B] Rash    Outpatient Encounter Medications as of 09/25/2020  Medication Sig  . acetaminophen (TYLENOL) 650 MG CR tablet Take 650 mg by mouth daily. 2 tablets in the morning  . albuterol (VENTOLIN HFA) 108 (90 Base) MCG/ACT inhaler Inhale 2 puffs into the lungs every 6 (six) hours as needed for wheezing or shortness of breath.  [name] aspirin EC 81 MG tablet Take 1 tablet (81 mg total) by mouth daily.  [name] BREZTRI AEROSPHERE 160-9-4.8 MCG/ACT AERO INHALE 2 PUFFS INTO THE LUNGS IN THE MORNING AND AT BEDTIME  . guaiFENesin (MUCINEX) 600 MG 12 hr tablet Take by mouth 2 (two) times daily.  [name] ketoconazole (NIZORAL) 2 % shampoo SMARTSIG:5 Milliliter(s) Topical Daily  . losartan (COZAAR) 25 MG tablet Take 1 tablet (25 mg total) by mouth daily. Please keep upcoming appt in April 2022 with Dr. Burt Knack before anymore refills. Thank you  . OXYGEN Inhale 3-6 L into the lungs continuous.   . polyethylene glycol powder (GLYCOLAX/MIRALAX) powder Take 17 g by mouth daily.  . pravastatin (PRAVACHOL) 40 MG tablet TAKE 1 TABLET(40 MG) BY MOUTH DAILY  . protein supplement shake (PREMIER PROTEIN) LIQD Take 2 oz by mouth daily.  . tamsulosin (FLOMAX) 0.4 MG CAPS capsule TAKE 1 CAPSULE(0.4 MG) BY MOUTH DAILY  . vitamin B-12 (CYANOCOBALAMIN) 1000 MCG tablet Take 1 tablet (1,000 mcg total) by mouth daily.  . [DISCONTINUED] Tdap (ADACEL) 12-15-13.5 LF-MCG/0.5 injection Inject 0.5 mLs into the muscle once.   No facility-administered encounter medications on file as of 09/25/2020.    Review of Systems:  Review of Systems  Constitutional: Positive for malaise/fatigue. Negative for chills and fever.  HENT: Negative for congestion and sore throat.   Eyes: Negative for blurred vision.       Glasses  Respiratory: Positive for sputum production. Negative for cough, shortness of breath and wheezing.   Cardiovascular: Positive for leg swelling. Negative for chest pain and palpitations.  Gastrointestinal:  Negative for abdominal pain, blood in stool, constipation, diarrhea and melena.  Genitourinary: Negative for dysuria.  Musculoskeletal: Negative for falls and joint pain.  Skin: Negative for itching and rash.  Neurological: Negative for dizziness and loss of consciousness.  Endo/Heme/Allergies: Bruises/bleeds easily.  Psychiatric/Behavioral: Negative for depression and memory loss. The patient is not nervous/anxious and does not have insomnia.     Health Maintenance  Topic Date Due  . TETANUS/TDAP  08/25/2020  . COVID-19 Vaccine (4 - Booster for Pfizer series) 11/07/2020  . INFLUENZA VACCINE  Completed  . PNA vac Low Risk Adult  Completed    Physical Exam: Vitals:   09/25/20 1314  BP: 138/72  Pulse: (!) 102  Temp: 97.7 F (36.5 C)  TempSrc: Temporal  SpO2: 98%  Weight: 139 lb 12.8 oz (63.4 kg)  Height: 5\' 8"  (1.727 m)   Body mass index is 21.26 kg/m. Physical Exam Vitals reviewed.  Constitutional:      Appearance: Normal appearance.  Eyes:     Conjunctiva/sclera: Conjunctivae normal.     Pupils: Pupils are equal, round, and reactive to light.     Comments: glasses  Cardiovascular:     Rate and Rhythm: Normal rate and regular rhythm.     Pulses: Normal pulses.     Heart sounds: Normal heart sounds.  Pulmonary:     Effort: Pulmonary effort is normal.     Breath sounds: Wheezing present. No rhonchi or rales.     Comments: Left upper posterior wheezes Abdominal:     General: Bowel sounds are normal.     Tenderness: There is no abdominal tenderness.  Musculoskeletal:        General: Normal range of motion.     Right lower leg: Edema present.     Left lower leg: Edema present.     Comments: Pitting edema of ankles   Neurological:     General: No focal deficit present.     Mental Status: He is alert and oriented to person, place, and time.     Gait: Gait abnormal.     Comments: Uses cane  Psychiatric:        Mood and Affect: Mood normal.        Behavior:  Behavior normal.     Labs reviewed: Basic Metabolic Panel: Recent Labs    01/18/20 1037 05/14/20 1006  NA 136 137  K 4.7 4.5  CL 99 99  CO2 32 30  GLUCOSE 100* 102*  BUN 15 15  CREATININE 0.49* 0.56*  CALCIUM 9.4 9.5   Liver Function Tests: Recent Labs    01/18/20 1037  AST 17  ALT 15  BILITOT 0.7  PROT 6.2   No results for input(s): LIPASE, AMYLASE in the last 8760 hours. No results for input(s): AMMONIA in the last 8760 hours. CBC: Recent Labs    01/18/20 1037 05/14/20 1006  WBC 7.0 7.2  NEUTROABS 5,579 5,702  HGB 12.2* 12.5*  HCT 37.7* 38.4*  MCV 89.3 89.3  PLT 217 203   Lipid Panel: Recent Labs    01/18/20 1037  CHOL 171  HDL 86  LDLCALC 73  TRIG 41  CHOLHDL 2.0   No results found for: HGBA1C  Procedures since last visit: No results found.  Assessment/Plan 1. Chronic respiratory failure with hypoxia (HCC) -continue current home oxygen therapy with humidity -due to #2  2. COPD mixed type ([place]) -cont current nebs and inhalers per pulmonary and oxygen therapy  3. Idiopathic progressive neuropathy -opted to avoid neurontin and lyrica due to increasing fall risk -willing to try cymbalta as this is more bothersome lately at night  -DULoxetine (CYMBALTA) 20 MG capsule; Take 1 capsule (20 mg total) by mouth daily.  Dispense: 30 capsule; Refill: 3  4. Venous insufficiency of both lower extremities -encouraged compression stockings, but he's not really wanting to do this--likes shorter, cooler socks and not having skin breakdown  5. Need for Tdap vaccination -recommended he get this done at the pharmacy  Labs/tests ordered:  No new Next appt:  01/08/2021  Discussed palliative care possibility for me to follow him in the future.  He will read more about it and let me know.  Zonia Caplin L. Ridley Schewe, D.O. [facility] [address], [place] [address] Cell Phone (Mon-Fri 8am-5pm):  [phone] On  Call:  [phone] & follow prompts after 5pm & weekends Office Phone:  [phone] Office Fax:  [phone]

## 2020-09-25 NOTE — Patient Instructions (Signed)
[facility]

## 2020-10-06 ENCOUNTER — Encounter: Payer: Self-pay | Admitting: Internal Medicine

## 2020-10-21 ENCOUNTER — Other Ambulatory Visit: Payer: Self-pay

## 2020-10-21 MED ORDER — ALBUTEROL SULFATE HFA 108 (90 BASE) MCG/ACT IN AERS: 2.0000 | INHALATION_SPRAY | Freq: Four times a day (QID) | RESPIRATORY_TRACT | 3 refills | Status: DC | PRN

## 2020-10-21 NOTE — Addendum Note (Signed)
Addended by: Valerie Salts on: 10/21/2020 01:58 PM   Modules accepted: Orders

## 2020-10-23 ENCOUNTER — Other Ambulatory Visit: Payer: Self-pay | Admitting: Internal Medicine

## 2020-11-14 ENCOUNTER — Encounter: Payer: Self-pay | Admitting: Cardiovascular Disease

## 2020-11-14 ENCOUNTER — Other Ambulatory Visit: Payer: Self-pay

## 2020-11-14 ENCOUNTER — Ambulatory Visit (INDEPENDENT_AMBULATORY_CARE_PROVIDER_SITE_OTHER): Payer: Medicare Other | Admitting: Cardiovascular Disease

## 2020-11-14 ENCOUNTER — Ambulatory Visit: Payer: Medicare Other | Admitting: Cardiovascular Disease

## 2020-11-14 VITALS — BP 150/82 | HR 92 | Ht 69.0 in | Wt 139.2 lb

## 2020-11-14 DIAGNOSIS — I25119 Atherosclerotic heart disease of native coronary artery with unspecified angina pectoris: Secondary | ICD-10-CM | POA: Diagnosis not present

## 2020-11-14 DIAGNOSIS — I10 Essential (primary) hypertension: Secondary | ICD-10-CM

## 2020-11-14 DIAGNOSIS — I2729 Other secondary pulmonary hypertension: Secondary | ICD-10-CM

## 2020-11-14 NOTE — Progress Notes (Signed)
Cardiology Office Note:    Date:  11/14/2020   ID:  [name], DOB 09/13/1934, MRN [id number]  PCP:  W[facility] [place], [place]  Cardiologist:  Sherren Mocha, MD  Advanced Practice Provider:  No care team member to display Electrophysiologist:  None       Referring MD: Gayland Curry, DO   Chief Complaint  Patient presents with  . Shortness of Breath    History of Present Illness:    [name] is a 86 y.o. male with a hx of coronary artery disease, presenting for follow-up evaluation. The patient has had coronary calcification identified on CT imaging. He also has had aortic atherosclerosis and calcification identified. He had a remote cardiac catheterization in 2003 demonstrating nonobstructive CAD with the exception of an 80% ostial ramus stenosis involving a small branch vessel.  The patient reports some progression in his breathing problems.  He is comfortable at rest and denies orthopnea or PND.  He has no chest pain or pressure.  However, he has shortness of breath with low-level activity.  His symptoms of slowly progressed over the past year.  He had a fall about 18 months ago and has struggled ever since that time.  He sustained compression fracture at the time of his fall.  Past Medical History:  Diagnosis Date  . Allergic rhinitis, cause unspecified   . Arthritis   . COPD (chronic obstructive pulmonary disease) ([place])   . Dyspnea   . Dysrhythmia    "1 episode of tachycardia in 1980's, been on it ever since"  . Emphysema of lung ([place])   . Hypertension   . Left inguinal hernia 02/07/2019  . Low hemoglobin    low level  . Neuromuscular disorder ([place])   . Neuropathy    feet  . Pneumonia    History of, last Oct 2019  . Pulmonary nodule     Past Surgical History:  Procedure Laterality Date  . APPENDECTOMY  1943  . CARDIAC CATHETERIZATION  2003   performed at [facility], Dr. Fransico Him  . CATARACT EXTRACTION  2008    Dr. Darleen Crocker  . CATARACT EXTRACTION, BILATERAL    . EYE SURGERY    . INGUINAL HERNIA REPAIR Left 02/07/2019   Procedure: OPEN REPAIR LEFT INGUINAL HERNIA WITH MESH;  Surgeon: Fanny Skates, MD;  Location: [place];  Service: General;  Laterality: Left;  SPINAL AND TAP BLOCK ANESTHESIA  . ROTATOR CUFF REPAIR  2002   Dr. Kathryne Hitch  . TONSILLECTOMY  1945  . VASECTOMY      Current Medications: Current Meds  Medication Sig  . acetaminophen (TYLENOL) 650 MG CR tablet Take 650 mg by mouth daily. 2 tablets in the morning  . albuterol (VENTOLIN HFA) 108 (90 Base) MCG/ACT inhaler Inhale 2 puffs into the lungs every 6 (six) hours as needed for wheezing or shortness of breath.  [name] BREZTRI AEROSPHERE 160-9-4.8 MCG/ACT AERO INHALE 2 PUFFS INTO THE LUNGS IN THE MORNING AND AT BEDTIME  . DULoxetine (CYMBALTA) 20 MG capsule Take 1 capsule (20 mg total) by mouth daily.  [name] guaiFENesin (MUCINEX) 600 MG 12 hr tablet Take by mouth 2 (two) times daily.  [name] ketoconazole (NIZORAL) 2 % shampoo SMARTSIG:5 Milliliter(s) Topical Daily  . losartan (COZAAR) 25 MG tablet Take 1 tablet (25 mg total) by mouth daily. Please keep upcoming appt in April 2022 with Dr. Burt Knack before anymore refills. Thank you  . OXYGEN Inhale 3-6  L into the lungs continuous.   . polyethylene glycol powder (GLYCOLAX/MIRALAX) powder Take 17 g by mouth daily.  . pravastatin (PRAVACHOL) 40 MG tablet TAKE 1 TABLET(40 MG) BY MOUTH DAILY  . protein supplement shake (PREMIER PROTEIN) LIQD Take 2 oz by mouth daily.  . tamsulosin (FLOMAX) 0.4 MG CAPS capsule TAKE 1 CAPSULE(0.4 MG) BY MOUTH DAILY  . vitamin B-12 (CYANOCOBALAMIN) 1000 MCG tablet Take 1 tablet (1,000 mcg total) by mouth daily.  . [DISCONTINUED] aspirin EC 81 MG tablet Take 1 tablet (81 mg total) by mouth daily.     Allergies:   Tolectin [tolmetin sodium], Montelukast sodium, Nsaids, Other, Augmentin [amoxicillin-pot clavulanate], and Neosporin [bacitracin-polymyxin b]   Social  History   Socioeconomic History  . Marital status: Married    Spouse name: Not on file  . Number of children: Not on file  . Years of education: Not on file  . Highest education level: Not on file  Occupational History  . Not on file  Tobacco Use  . Smoking status: Former Smoker    Packs/day: 2.00    Years: 38.00    Pack years: 76.00    Types: Cigarettes    Quit date: 08/16/1990    Years since quitting: 30.2  . Smokeless tobacco: Never Used  Vaping Use  . Vaping Use: Never used  Substance and Sexual Activity  . Alcohol use: Not Currently    Alcohol/week: 14.0 standard drinks    Types: 14 Shots of liquor per week    Comment: seldom  . Drug use: No  . Sexual activity: Not on file  Other Topics Concern  . Not on file  Social History Narrative   Social History      Diet?       Do you drink/eat things with caffeine? Coffee 1 cup daily       Marital status?                      Married               What year were you married? 1962      Do you live in a house, apartment, assisted living, condo, trailer, etc.? house      Is it one or more stories? 2      How many persons live in your home?  2      Do you have any pets in your home? (please list)   No       Highest level of education completed?  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       Do you exercise?                    some                  Type & how often? Did pulmonary rehab      Advanced Directives      Do you have a living will? yes      Do you have a DNR form?                                  If not, do you want to discuss one?      Do you have signed POA/HPOA for forms? yes      Functional Status  Do you have difficulty bathing or dressing yourself?      Do you have difficulty preparing food or eating?       Do you have difficulty managing your medications?      Do you have difficulty managing your finances?      Do you have  difficulty affording your medications?      Social Determinants of Health   Financial Resource Strain: Not on file  Food Insecurity: Not on file  Transportation Needs: Not on file  Physical Activity: Not on file  Stress: Not on file  Social Connections: Not on file     Family History: The patient's family history includes COPD in his mother; Coronary artery disease in an other family member; Stroke in his father and mother.  ROS:   Please see the history of present illness.    All other systems reviewed and are negative.  EKGs/Labs/Other Studies Reviewed:    The following studies were reviewed today: Echo 09/28/2019: IMPRESSIONS    1. Suboptimal acoustic windows for diagnostic images. Limited data  obtained.  2. Left ventricular ejection fraction, by estimation, is 50 to 55%. The  left ventricle has low normal function. LV endocardial border not  optimally defined to evaluate regional wall motion. There is mildly  increased left ventricular hypertrophy. Left  ventricular diastolic function could not be evaluated.  3. Right ventricular systolic function is mildly reduced. The right  ventricular size is severely enlarged. Tricuspid regurgitation signal is  inadequate for assessing PA pressure. Mild ventricular septal flattening  in systole, suggestive of RV pressure  overload.  4. The mitral valve is grossly normal. No evidence of mitral valve  regurgitation.  5. The aortic valve was not well visualized. Aortic valve regurgitation  is not visualized.  6. The inferior vena cava is normal in size with greater than 50%  respiratory variability, suggesting right atrial pressure of 3 mmHg.   Comparison(s): A prior study was performed on 07/31/2018. Prior images  reviewed side by side. RV size appears to have increased.   Myoview 09/28/2019: Study Highlights    Nuclear stress EF: 59%.  The left ventricular ejection fraction is normal (55-65%).  There was no ST  segment deviation noted during stress.  No T wave inversion was noted during stress.  This is a low risk study.   No ischemia. LVEF 59% with normal wall motion. This is a low risk study.  EKG:  EKG is ordered today.  The ekg ordered today demonstrates normal sinus rhythm 92 bpm, low voltage QRS, right axis deviation, pulmonary disease pattern, no change from previous tracings.  Recent Labs: 01/18/2020: ALT 15 05/14/2020: BUN 15; Creat 0.56; Hemoglobin 12.5; Platelets 203; Potassium 4.5; Sodium 137  Recent Lipid Panel    Component Value Date/Time   CHOL 171 01/18/2020 1037   TRIG 41 01/18/2020 1037   HDL 86 01/18/2020 1037   CHOLHDL 2.0 01/18/2020 1037   LDLCALC 73 01/18/2020 1037     Risk Assessment/Calculations:       Physical Exam:    VS:  BP (!) 150/82   Pulse 92   Ht 5\' 9"  (1.753 m)   Wt 139 lb 3.2 oz (63.1 kg)   SpO2 94%   BMI 20.56 kg/m     Wt Readings from Last 3 Encounters:  11/14/20 139 lb 3.2 oz (63.1 kg)  09/25/20 139 lb 12.8 oz (63.4 kg)  08/21/20 137 lb 9.6 oz (62.4 kg)     GEN:  Well  nourished, well developed in no acute distress HEENT: Normal NECK: No JVD; No carotid bruits LYMPHATICS: No lymphadenopathy CARDIAC: RRR, no murmurs, rubs, gallops.  Distant heart sounds RESPIRATORY: Scattered rhonchi, poor air movement ABDOMEN: Soft, non-tender, non-distended MUSCULOSKELETAL: 2+ bilateral ankle edema; No deformity  SKIN: Warm and dry NEUROLOGIC:  Alert and oriented x 3 PSYCHIATRIC:  Normal affect   ASSESSMENT:    1. Coronary artery disease involving native coronary artery of native heart with angina pectoris ([place])   2. Other secondary pulmonary hypertension ([place])   3. Essential hypertension    PLAN:    In order of problems listed above:  1. Nuclear stress test from last year reviewed and demonstrated no ischemia.  I reviewed his heart catheterization from 2003 when he had minor nonobstructive CAD.  He had a stenosis in a tiny intermediate  branch.  We discussed pros and cons of long-term aspirin.  We have decided we will discontinue this. 2. Related to his advanced lung disease with chronic oxygen dependent respiratory failure.  Supportive care, supplemental oxygen. 3. Home blood pressures have been in range.  He had stress involved with getting into the office today.  Suspect this reading is not reflective of how he is controlled at home.  He will continue on losartan with no changes.  We discussed palliative care today.  He has had discussions with his primary care doctor regarding this.  He continues to contemplate his readiness for palliative care.  I will plan on seeing him back in 1 year for cardiology follow-up.  For his leg edema, we gave him some information on leg elevation and compression.        Medication Adjustments/Labs and Tests Ordered: Current medicines are reviewed at length with the patient today.  Concerns regarding medicines are outlined above.  No orders of the defined types were placed in this encounter.  No orders of the defined types were placed in this encounter.   There are no Patient Instructions on file for this visit.   Signed, Sherren Mocha, MD  11/14/2020 11:21 AM    [place]

## 2020-11-14 NOTE — Patient Instructions (Signed)
Medication Instructions:  1) STOP ASPIRIN *If you need a refill on your cardiac medications before your next appointment, please call your pharmacy*   Follow-Up: At [facility], you and your health needs are our priority.  As part of our continuing mission to provide you with exceptional heart care, we have created designated Provider Care Teams.  These Care Teams include your primary Cardiologist (physician) and Advanced Practice Providers (APPs -  Physician Assistants and Nurse Practitioners) who all work together to provide you with the care you need, when you need it. Your next appointment:   12 month(s) The format for your next appointment:   In Person Provider:   You may see Sherren Mocha, MD or one of the following Advanced Practice Providers on your designated Care Team:    Richardson Dopp, PA-C  Robbie Lis, [place]    Non-medical compression stockings may be easier for you to apply. They are available at stores and online for purchase.    For your  leg edema you should do the following: 1. Leg elevation - I recommend the Lounge Dr. Leg rest.  See below for details  2. Salt restriction  -  Use potassium chloride instead of regular salt as a salt substitute. 3. Walk regularly 4. Compression hose - [facility] [employer] [facility] 5. Weight loss    Available on [email or web address] Or  Go to [email or web address]

## 2020-11-16 DIAGNOSIS — J449 Chronic obstructive pulmonary disease, unspecified: Secondary | ICD-10-CM

## 2020-11-17 NOTE — Telephone Encounter (Signed)
Patient sent email this morning  1. Do you recommend that I get a second Covid booster shot? 2.  It has been suggested that I consider Palliative Care to help in the management of my COPD.  Is this  something you would recommend and, if so, would it be something to do now or at a later date. Thank you.  [name] [address]   Sending to Dr. Annamaria Boots for recommendations

## 2020-11-17 NOTE — Telephone Encounter (Signed)
I) I do think a second booster is reasonable- go ahead.  2) Palliative Care is useful as a way to think about needs and planning with no commitment, so timing isn't critical.        Order- Please refer [name] to Palliative Care, dx COPD mixed Type,

## 2020-11-18 ENCOUNTER — Encounter: Payer: Self-pay | Admitting: Internal Medicine

## 2020-11-18 NOTE — Telephone Encounter (Signed)
Called patient and informed him that Dr. Mariea Clonts was no longer with the Practice.  Confirmed with patient that he still wanted to be a patient of [facility] with Dr. Sabra Heck and he stated that he did.  Confirmed his [name] appointment.

## 2020-11-18 NOTE — Telephone Encounter (Signed)
Order has been placed. Nothing further needed at this time,

## 2020-11-19 ENCOUNTER — Telehealth: Payer: Self-pay

## 2020-11-19 NOTE — Telephone Encounter (Signed)
Attempted to contact patient's wife Izora Gala to schedule a Palliative Care consult appointment. No answer left a message to return call. Sent Mychart message also.

## 2020-11-20 ENCOUNTER — Telehealth: Payer: Self-pay

## 2020-11-20 NOTE — Telephone Encounter (Signed)
Spoke with patient and scheduled an in-person Palliative Consult for 12/24/20 @ 12:30PM. He requested this appointment time.  COVID screening was negative. No pets in home. Patient lives with wife.   Consent obtained; updated Outlook/Netsmart/Team List and Epic.  Family is aware they may be receiving a call from NP the day before or day of to confirm appointment.

## 2020-11-21 DIAGNOSIS — Z23 Encounter for immunization: Secondary | ICD-10-CM | POA: Diagnosis not present

## 2020-12-24 ENCOUNTER — Other Ambulatory Visit: Payer: Self-pay | Admitting: Nurse Practitioner

## 2020-12-31 ENCOUNTER — Other Ambulatory Visit: Payer: Self-pay | Admitting: *Deleted

## 2020-12-31 NOTE — Patient Outreach (Signed)
[facility]) Care Management  12/31/2020  [name] [birth year] [id number]   Referral Date: 5/11 Referral Source: Insurance Referral Reason: Chronic case management Insurance: [place] attempt #1, unsuccessful, HIPAA compliant voice message left.   Plan: RN CM will send outreach letter and follow up within the next 3-4 business days.  [name], [place], MSN [place] [phone]

## 2021-01-01 ENCOUNTER — Ambulatory Visit: Payer: Self-pay | Admitting: *Deleted

## 2021-01-05 ENCOUNTER — Other Ambulatory Visit: Payer: Self-pay | Admitting: *Deleted

## 2021-01-05 NOTE — Patient Outreach (Signed)
[facility]) Care Management  01/05/2021  [name] 05-27-1935 [id number]   Outreach attempt #2, successful.  Identity verified.  This care manager introduced self and stated purpose of call.  [facility] care management services explained.  Member is hesitant about agreeing to services, state he has a visit scheduled with palliative care on 5/31.  Difference between palliative care and [facility] discussed, advised that [facility] does not interfere with palliative.  Remains hesitant, agrees to review brochure and have this RNCM follow up within the next 3-4 business days.  [name], [place], MSN [place] [phone]

## 2021-01-08 ENCOUNTER — Ambulatory Visit: Payer: Medicare Other | Admitting: Internal Medicine

## 2021-01-09 ENCOUNTER — Encounter: Payer: Self-pay | Admitting: *Deleted

## 2021-01-09 ENCOUNTER — Other Ambulatory Visit: Payer: Self-pay | Admitting: *Deleted

## 2021-01-09 DIAGNOSIS — J441 Chronic obstructive pulmonary disease with (acute) exacerbation: Secondary | ICD-10-CM

## 2021-01-09 NOTE — Patient Outreach (Signed)
[facility]) Care Management  01/09/2021  [name] 1934-12-12 [id number]   Referral Date: 5/11 Referral Source: Insurance Referral Reason: Chronic case management Insurance: DCE  Outreach attempt as follow up to engage member, he agrees.   Social: Lives with wife but has son in the area for support.  State he usually cares for his wife the most as she also has medical conditions. They have an aide from [facility] twice a week for 3 hours and have a housekeeper once a week.    Conditions: Per chart, has history of COPD on home O2 at 3-4 LPM, CAD, HTN, HLD, and neuropathy.  Medications: Reviewed with member,report taking as instructed.  Denies need for financial assistance.   Appointments: Has follow up with PCP on 5/31, home visit with [facility] on 6/3, and visit with pulmonology on 7/6.  He expresses frustration regarding difficulty with parking and walking to the buildings.  Agrees to have Care Guide follow up with options.  Advance Directives: Report son is POA, no desire to change at this time.  Consent: Member agrees to [facility] involvement and Care Plan.  Plan: RN CM will send member education regarding COPD management.  Will place referral to Care Guide.  Will notify PCP of [facility] involvement.  Will follow up with member within the next 2 weeks.  Goals Addressed            This Visit's Progress   . [facility] - Manage Fatigue (Tiredness-COPD)       Timeframe:  Short-Term Goal Priority:  Medium Start Date:            5/27                 Expected End Date:   8/27                     Follow Up Date 6/10  Barriers: Knowledge Transportation    - eat healthy - get at least 7 to 8 hours of sleep at night    Why is this important?    Feeling tired or worn out is a common symptom of COPD (chronic obstructive pulmonary disease).   Learning when you feel your best and when you need rest is important.   Managing the tiredness (fatigue) will  help you be active and enjoy life.     Notes:   5/27 - Referral to Care Guide for transportation options as patient report difficulty with parking then having to walk to the MD offices.  Stopped Pulmonary rehab due to distance from parking lot and building as well as Covid shutdowns.    Margie Billet - [place] My Quality of Life       Timeframe:  Long-Range Goal Priority:  High Start Date:       5/27                      Expected End Date:   11/27                   Follow Up Date 6/10  Barriers: Knowledge Transportation     Why is this important?    Having a long-term illness can be scary.   It can also be stressful for you and your caregiver.   These steps may help.    Notes:   5/27 -Education sent on management of COPD.  Discussed with member possibility of restarting pulmonary rehab, will discuss  with pulmonology      [name], RN, MSN [place] Manager [phone]

## 2021-01-13 ENCOUNTER — Encounter: Payer: Self-pay | Admitting: Family Medicine

## 2021-01-13 ENCOUNTER — Other Ambulatory Visit: Payer: Self-pay

## 2021-01-13 ENCOUNTER — Ambulatory Visit (INDEPENDENT_AMBULATORY_CARE_PROVIDER_SITE_OTHER): Payer: Medicare Other | Admitting: Family Medicine

## 2021-01-13 ENCOUNTER — Telehealth: Payer: Self-pay | Admitting: *Deleted

## 2021-01-13 VITALS — BP 138/78 | HR 76 | Temp 97.7°F | Ht 69.0 in | Wt 139.6 lb

## 2021-01-13 DIAGNOSIS — J449 Chronic obstructive pulmonary disease, unspecified: Secondary | ICD-10-CM | POA: Diagnosis not present

## 2021-01-13 DIAGNOSIS — I10 Essential (primary) hypertension: Secondary | ICD-10-CM

## 2021-01-13 DIAGNOSIS — I872 Venous insufficiency (chronic) (peripheral): Secondary | ICD-10-CM | POA: Diagnosis not present

## 2021-01-13 DIAGNOSIS — I25119 Atherosclerotic heart disease of native coronary artery with unspecified angina pectoris: Secondary | ICD-10-CM | POA: Diagnosis not present

## 2021-01-13 DIAGNOSIS — E7800 Pure hypercholesterolemia, unspecified: Secondary | ICD-10-CM

## 2021-01-13 DIAGNOSIS — G603 Idiopathic progressive neuropathy: Secondary | ICD-10-CM | POA: Diagnosis not present

## 2021-01-13 MED ORDER — TRIAMTERENE-HCTZ 37.5-25 MG PO TABS
1.0000 | ORAL_TABLET | Freq: Every day | ORAL | 3 refills | Status: DC
Start: 2021-01-13 — End: 2021-02-18

## 2021-01-13 NOTE — Telephone Encounter (Signed)
   Telephone encounter was:  Unsuccessful.  01/13/2021 Name: [name] MRN: [id number] DOB: 1935-03-08  Unsuccessful outbound call made today to assist with:  Transportation Needs   Outreach Attempt:  1st Attempt  A HIPAA compliant voice message was left requesting a return call.  Instructed patient to call back at [facility] Referral Received   [place] Referral Received

## 2021-01-13 NOTE — Progress Notes (Signed)
Provider:  Alain Honey, MD  Careteam: Patient Care Team: Wardell Honour, MD as PCP - General (Family Medicine) Sherren Mocha, MD as PCP - Cardiology (Cardiology) Jari Pigg, MD as Consulting Physician (Dermatology) Sherren Mocha, MD as Consulting Physician (Cardiology) Corey Harold, MD as Consulting Physician Newman Pies, MD as Consulting Physician (Neurosurgery) Deneise Lever, MD as Consulting Physician (Pulmonary Disease) [name], RN as Secor Management  PLACE OF SERVICE:  [place]  Advanced Directive information    Allergies  Allergen Reactions  . Tolectin [Tolmetin Sodium] Other (See Comments)    BP low and passed out  . Montelukast Sodium Other (See Comments)    Unknown rxn;  . Nsaids   . Other Other (See Comments)  . Augmentin [Amoxicillin-Pot Clavulanate] Nausea And Vomiting    Did it involve swelling of the face/tongue/throat, SOB, or low BP? No Did it involve sudden or severe rash/hives, skin peeling, or any reaction on the inside of your mouth or nose? No Did you need to seek medical attention at a hospital or doctor's office? No When did it last happen?Last year If all above answers are "NO", may proceed with cephalosporin use.  [name] Neosporin [Bacitracin-Polymyxin B] Rash    Chief Complaint  Patient presents with  . Acute Visit    Patient presents today for a 4 month COPD follow-up. He reports using between 3-5 liters of oxygen.     HPI: Patient is a 86 y.o. male [name] is a neighbor of mine and not sure what he wants to do for medical care going forward.  He had seen Dr. Mariea Clonts twice and before that had been at [facility] in [place] for some years.  He has severe COPD and is followed by Keturah Barre respiratory specialist. His breathing is doing fair.  He takes albuterol as well as breast 3 for breathing.  He is on chronic O2 therapy. Today he is complaining of some lower extremity edema.   There is a history of venous insufficiency but I suspect this may be related to his COPD and right-sided failure as well. He does complain of some nocturia.  Currently on Flomax 0.4 mg/day.  Review of Systems:  Review of Systems  Constitutional: Positive for malaise/fatigue.  Respiratory: Positive for shortness of breath.   Cardiovascular: Positive for leg swelling.  Genitourinary: Positive for frequency.  Neurological: Negative.   Psychiatric/Behavioral: Negative.   All other systems reviewed and are negative.   Past Medical History:  Diagnosis Date  . Allergic rhinitis, cause unspecified   . Arthritis   . COPD (chronic obstructive pulmonary disease) ([place])   . Dyspnea   . Dysrhythmia    "1 episode of tachycardia in 1980's, been on it ever since"  . Emphysema of lung ([place])   . Hypertension   . Left inguinal hernia 02/07/2019  . Low hemoglobin    low level  . Neuromuscular disorder ([place])   . Neuropathy    feet  . Pneumonia    History of, last Oct 2019  . Pulmonary nodule    Past Surgical History:  Procedure Laterality Date  . APPENDECTOMY  1943  . CARDIAC CATHETERIZATION  2003   performed at [facility], Dr. Fransico Him  . CATARACT EXTRACTION  2008   Dr. Darleen Crocker  . CATARACT EXTRACTION, BILATERAL    . EYE SURGERY    . INGUINAL HERNIA REPAIR Left 02/07/2019   Procedure: OPEN REPAIR LEFT INGUINAL HERNIA WITH MESH;  Surgeon: Fanny Skates, MD;  Location: [place];  Service: General;  Laterality: Left;  SPINAL AND TAP BLOCK ANESTHESIA  . ROTATOR CUFF REPAIR  2002   Dr. Kathryne Hitch  . TONSILLECTOMY  1945  . VASECTOMY     Social History:   reports that he quit smoking about 30 years ago. His smoking use included cigarettes. He has a 76.00 pack-year smoking history. He has never used smokeless tobacco. He reports previous alcohol use of about 14.0 standard drinks of alcohol per week. He reports that he does not use drugs.  Family History  Problem Relation Age of Onset  .  COPD Mother   . Stroke Mother   . Stroke Father   . Coronary artery disease Other        Cash    Medications: Patient's Medications  New Prescriptions   No medications on file  Previous Medications   ACETAMINOPHEN (TYLENOL) 650 MG CR TABLET    Take 650 mg by mouth daily. 2 tablets in the morning   ALBUTEROL (VENTOLIN HFA) 108 (90 BASE) MCG/ACT INHALER    Inhale 2 puffs into the lungs every 6 (six) hours as needed for wheezing or shortness of breath.   BREZTRI AEROSPHERE 160-9-4.8 MCG/ACT AERO    INHALE 2 PUFFS INTO THE LUNGS IN THE MORNING AND AT BEDTIME   DULOXETINE (CYMBALTA) 20 MG CAPSULE    Take 1 capsule (20 mg total) by mouth daily.   GUAIFENESIN (MUCINEX) 600 MG 12 HR TABLET    Take by mouth 2 (two) times daily.   KETOCONAZOLE (NIZORAL) 2 % SHAMPOO    SMARTSIG:5 Milliliter(s) Topical Daily   LOSARTAN (COZAAR) 25 MG TABLET    Take 1 tablet (25 mg total) by mouth daily. Please keep upcoming appt in April 2022 with Dr. Burt Knack before anymore refills. Thank you   OXYGEN    Inhale 3-6 L into the lungs continuous.    POLYETHYLENE GLYCOL POWDER (GLYCOLAX/MIRALAX) POWDER    Take 17 g by mouth daily.   PRAVASTATIN (PRAVACHOL) 40 MG TABLET    TAKE 1 TABLET(40 MG) BY MOUTH DAILY   PROTEIN SUPPLEMENT SHAKE (PREMIER PROTEIN) LIQD    Take 2 oz by mouth daily.   TAMSULOSIN (FLOMAX) 0.4 MG CAPS CAPSULE    TAKE 1 CAPSULE(0.4 MG) BY MOUTH DAILY   VITAMIN B-12 (CYANOCOBALAMIN) 1000 MCG TABLET    Take 1 tablet (1,000 mcg total) by mouth daily.  Modified Medications   No medications on file  Discontinued Medications   No medications on file    Physical Exam:  There were no vitals filed for this visit. There is no height or weight on file to calculate BMI. Wt Readings from Last 3 Encounters:  11/14/20 139 lb 3.2 oz (63.1 kg)  09/25/20 139 lb 12.8 oz (63.4 kg)  08/21/20 137 lb 9.6 oz (62.4 kg)    Physical Exam Vitals and nursing note reviewed.  Constitutional:      Appearance: Normal  appearance.  HENT:     Head: Normocephalic.  Cardiovascular:     Rate and Rhythm: Normal rate and regular rhythm.  Pulmonary:     Effort: Pulmonary effort is normal.     Breath sounds: Normal breath sounds.  Abdominal:     General: Abdomen is flat. Bowel sounds are normal.  Musculoskeletal:     Right lower leg: Edema present.     Left lower leg: Edema present.     Comments: Has skin changes consistent with stasis dermatitis and  has 2-3+ edema bilaterally  Skin:    General: Skin is warm.  Neurological:     General: No focal deficit present.     Mental Status: He is alert and oriented to person, place, and time.     Labs reviewed: Basic Metabolic Panel: Recent Labs    01/18/20 1037 05/14/20 1006  NA 136 137  K 4.7 4.5  CL 99 99  CO2 32 30  GLUCOSE 100* 102*  BUN 15 15  CREATININE 0.49* 0.56*  CALCIUM 9.4 9.5   Liver Function Tests: Recent Labs    01/18/20 1037  AST 17  ALT 15  BILITOT 0.7  PROT 6.2   No results for input(s): LIPASE, AMYLASE in the last 8760 hours. No results for input(s): AMMONIA in the last 8760 hours. CBC: Recent Labs    01/18/20 1037 05/14/20 1006  WBC 7.0 7.2  NEUTROABS 5,579 5,702  HGB 12.2* 12.5*  HCT 37.7* 38.4*  MCV 89.3 89.3  PLT 217 203   Lipid Panel: Recent Labs    01/18/20 1037  CHOL 171  HDL 86  LDLCALC 73  TRIG 41  CHOLHDL 2.0   TSH: No results for input(s): TSH in the last 8760 hours. A1C: No results found for: HGBA1C   Assessment/Plan  1. Venous insufficiency of both lower extremities We will add low-dose diuretic but with instructions to limit salt intake, elevate legs,.  Also minimally discussed Ted hose although I think he is not likely to use these.  Suspect this is multifactorial, venous insufficiency as well as right-sided heart failure and COPD - triamterene-hydrochlorothiazide (MAXZIDE-25) 37.5-25 MG tablet; Take 1 tablet by mouth daily.  Dispense: 90 tablet; Refill: 3  2. Pure  hypercholesterolemia It has been a year since lipids were last assessed and they were at goal last June.  Will redraw today - Lipid Panel - CMP with eGFR([facility])  3. Primary hypertension Blood pressure is good at 138/78.  He does take losartan once a day.  I did suggest to break it in half and take it twice a day to get better coverage  4. COPD mixed type ([place]) Breathing is stable on inhalers and oxygen  5. Idiopathic progressive neuropathy Has not started Cymbalta due to fear of side effects.  I did suggest topical cream, fibro which she will try before initiating Cymbalta   Alain Honey, MD [place] [phone]

## 2021-01-13 NOTE — Telephone Encounter (Signed)
   Telephone encounter was:  Successful.  01/13/2021 Name: TOMIO KIRK MRN: [id number] DOB: 1934/10/04  MELCHIZEDEK ESPINOLA is a 86 y.o. year old male who is a primary care patient of Wardell Honour, MD . The community resource team was consulted for assistance with Transportation Needs   Care guide performed the following interventions: Patient provided with information about care guide support team and interviewed to confirm resource needs.  Follow Up Plan:  No further follow up planned at this time. The patient has been provided with needed resources.  [place], Care Management  [phone] [address] , [place] [address] Email : [email or web address]

## 2021-01-13 NOTE — Patient Instructions (Addendum)
Increase Flomax to .8 mg with supper Elevate legs when possible Break Losartan in half and take twice a day Pick up Fibro for foot pain

## 2021-01-14 ENCOUNTER — Telehealth: Payer: Self-pay

## 2021-01-14 ENCOUNTER — Encounter: Payer: Self-pay | Admitting: Family Medicine

## 2021-01-14 LAB — COMPLETE METABOLIC PANEL WITH GFR
AG Ratio: 2.3 (calc) (ref 1.0–2.5)
ALT: 12 U/L (ref 9–46)
AST: 16 U/L (ref 10–35)
Albumin: 4.2 g/dL (ref 3.6–5.1)
Alkaline phosphatase (APISO): 39 U/L (ref 35–144)
BUN/Creatinine Ratio: 31 (calc) — ABNORMAL HIGH (ref 6–22)
BUN: 15 mg/dL (ref 7–25)
CO2: 32 mmol/L (ref 20–32)
Calcium: 9.2 mg/dL (ref 8.6–10.3)
Chloride: 99 mmol/L (ref 98–110)
Creat: 0.48 mg/dL — ABNORMAL LOW (ref 0.70–1.11)
GFR, Est African American: 118 mL/min/{1.73_m2} (ref >=60)
GFR, Est Non African American: 102 mL/min/{1.73_m2} (ref >=60)
Globulin: 1.8 g/dL (calc) — ABNORMAL LOW (ref 1.9–3.7)
Glucose, Bld: 94 mg/dL (ref 65–139)
Potassium: 4.4 mmol/L (ref 3.5–5.3)
Sodium: 138 mmol/L (ref 135–146)
Total Bilirubin: 0.6 mg/dL (ref 0.2–1.2)
Total Protein: 6 g/dL — ABNORMAL LOW (ref 6.1–8.1)

## 2021-01-14 LAB — LIPID PANEL
Cholesterol: 158 mg/dL (ref <200)
HDL: 92 mg/dL (ref >=40)
LDL Cholesterol (Calc): 57 mg/dL (calc)
Non-HDL Cholesterol (Calc): 66 mg/dL (calc) (ref <130)
Total CHOL/HDL Ratio: 1.7 (calc) (ref <5.0)
Triglycerides: 30 mg/dL (ref <150)

## 2021-01-14 MED ORDER — TAMSULOSIN HCL 0.4 MG PO CAPS: ORAL_CAPSULE | ORAL | 1 refills | Status: AC

## 2021-01-14 MED ORDER — TAMSULOSIN HCL 0.4 MG PO CAPS: ORAL_CAPSULE | ORAL | 1 refills | Status: DC

## 2021-01-14 NOTE — Telephone Encounter (Signed)
Patient called to inquire about the status of his message, as he has not received a reply from [facility]. I informed patient that Dr.Miller sent rx for tamsulosin 0.4 mg to the pharmacy.  Patient states he and Dr.Miller had a conversation at yesterday's visit about him taking 2 of the 0.4 mg tablets. Patient sent this mychar message question if a rx for 0.8 mg could be sent in or if the dispense number for the 0.4 mg tablet would be increased.  I informed patient that I am unable to locate documentation of the above in yesterday's note, neither do I see a change on the medication list that would indicate an increase in medication.  Patient asked that I send this message back to Dr.Miller to further advise

## 2021-01-14 NOTE — Telephone Encounter (Signed)
VM left for patient.

## 2021-01-14 NOTE — Addendum Note (Signed)
Addended by: Rafael Bihari A on: 01/14/2021 02:50 PM   Modules accepted: Orders

## 2021-01-16 ENCOUNTER — Other Ambulatory Visit: Payer: Self-pay

## 2021-01-16 ENCOUNTER — Other Ambulatory Visit: Payer: Medicare Other | Admitting: Nurse Practitioner

## 2021-01-16 VITALS — BP 120/62 | HR 81 | Resp 18

## 2021-01-16 DIAGNOSIS — R600 Localized edema: Secondary | ICD-10-CM | POA: Diagnosis not present

## 2021-01-16 DIAGNOSIS — J9611 Chronic respiratory failure with hypoxia: Secondary | ICD-10-CM

## 2021-01-16 DIAGNOSIS — Z515 Encounter for palliative care: Secondary | ICD-10-CM

## 2021-01-16 NOTE — Progress Notes (Signed)
[facility] Palliative Care Consult Note Telephone: [phone]  Fax: [phone]    Date of encounter: 01/16/21 PATIENT NAME: [name] [address] [place] [address]   [phone] (home)  DOB: 01/10/1935 MRN: [id number]  PRIMARY CARE PROVIDER:    Wardell Honour, MD,  [place] [address] [phone]  REFERRING PROVIDER:   Wardell Honour, [place],  [place] [address] [phone]  RESPONSIBLE PARTY:    Contact Information    Name Relation Home Work Mobile   [place] Spouse Rowley Son [phone]  [phone]   [name], [name]   [phone]     I met face to face with patient in home. His wife present at visit. Palliative Care was asked to follow this patient by consultation request of  Wardell Honour, MD to address advance care planning and complex medical decision making. This is the initial visit.                                  ASSESSMENT AND PLAN / RECOMMENDATIONS:   Advance Care Planning/Goals of Care: Goals include to maximize quality of life and symptom management.  Visit consisted of building trust and discussions on Palliative care medicine as a specialized medical care for people living with serious illness, aimed at facilitating improved quality of life through symptoms relief, assisting with advance care planning and establishing goals of care. Patient expressed appreciation for education provided on Palliative care and how it differs from Hospice service Our advance care planning conversation today included a discussion about:     The value and importance of advance care planning   Experiences with loved ones who have been seriously ill or have died   Exploration of personal, cultural or spiritual beliefs that might influence medical decisions   Exploration of goals of care in the event of a sudden injury or illness   Review and  updating or creation of an  advance directive document .  Decision not to resuscitate   CODE STATUS: DNR Goal of care: Patient's goal of care is comfort while preserving function. Directives: Patient decided to not be resuscitated in the event of cardiac or respiratory arrest. DNR form signed for patient, advised him to keep the form in a readily accessible area in home for easy assess if needed. Copy of DNR form uploaded to [facility] EMR. Questions and concerns were addressed. Patient encouraged to call with questions and/or concerns. Palliative care will continue to provide support to patient, family and the medical team.  I spent 25 minutes providing this consultation. More than 50% of the time in this consultation was spent in counseling and care coordination. ------------------------------------------------------------------------------------  Symptom Management/Plan: Localized edema: +3 bilateral lower extremity edema. Take Triamterene-Hydrochlorothiazide (Maxzide) 37.5-25mg as prescribed. Educated on the need for compression on his lower extremities due to his venous insufficiency, encouraged patient to use knee length compression socks. Encouraged elevation of legs during prolong sitting. Patient verbalized understanding. Chronic respiratory failure with hypoxia: oxygen saturation today initially 84% on 3L, with deep breaths it came up to 99% on 3L. Report  Dyspnea in exertion, controlled by taking time to complete activities. Denied cough or increased sputum production. Continue current plan of care and continue to follow up with Dr. Annamaria Boots as scheduled.   Follow up Palliative Care  Visit: Palliative care will continue to follow for complex medical decision making, advance care planning, and clarification of goals. Return in about 4-6 weeks or prn.  PPS: 60%  HOSPICE ELIGIBILITY/DIAGNOSIS: TBD  Chief Complaint: bilateral lower extremities  History obtained from review of EMR,  discussion with primary team, and interview with family, facility staff/caregiver and/or [name].  HISTORY OF PRESENT ILLNESS:  [name] is a 86 y.o. year old male  with multiple medical problem significant for COPD/emphysema oxygen dependent, pulmonary nodules, venous insufficiency, HTN, CAD,  Patient with complain of bilateral lower extremity edema in the context of venous insufficiency. Condition ongoing but has worsened in the last month. He was seen by Dr. Sabra Heck with [facility] senior care 3 days ago, started on Maxzide 37.5-25mg one tablet daily which patient said he is yet to pick up from the pharmacy. He report SOB with exertion which has been ongoing according to patient. No report of acute cough or increased sputum production. No report of fever or chills.  Patient verbalized desire to continue primary care services with Dr. Mariea Clonts who is now with [facility], asked what the process would be for ongoing care, patient made aware that she would be contacted by admin staff for scheduling. Recent Results (from the past 2160 hour(s))  CMP with eGFR([facility])     Status: Abnormal   Collection Time: 01/13/21  2:04 PM  Result Value Ref Range   Glucose, Bld 94 65 - 139 mg/dL    Comment: .        Non-fasting reference interval .    BUN 15 7 - 25 mg/dL    Comment: Verified by repeat analysis. .    Creat 0.48 (L) 0.70 - 1.11 mg/dL    Comment: Verified by repeat analysis. . For patients >2 years of age, the reference limit for Creatinine is approximately 13% higher for people identified as African-American. .    GFR, Est Non African American 102 > OR = 60 mL/min/1.32m   GFR, Est African American 118 > OR = 60 mL/min/1.755m  BUN/Creatinine Ratio 31 (H) 6 - 22 (calc)   Sodium 138 135 - 146 mmol/L   Potassium 4.4 3.5 - 5.3 mmol/L   Chloride 99 98 - 110 mmol/L   CO2 32 20 - 32 mmol/L   Calcium 9.2 8.6 - 10.3 mg/dL   Total Protein 6.0 (L) 6.1 - 8.1 g/dL   Albumin 4.2  3.6 - 5.1 g/dL   Globulin 1.8 (L) 1.9 - 3.7 g/dL (calc)   AG Ratio 2.3 1.0 - 2.5 (calc)   Total Bilirubin 0.6 0.2 - 1.2 mg/dL   Alkaline phosphatase (APISO) 39 35 - 144 U/L   AST 16 10 - 35 U/L   ALT 12 9 - 46 U/L  Lipid panel     Status: None   Collection Time: 01/13/21  2:04 PM  Result Value Ref Range   Cholesterol 158 <200 mg/dL   HDL 92 > OR = 40 mg/dL   Triglycerides 30 <150 mg/dL   LDL Cholesterol (Calc) 57 mg/dL (calc)    Comment: Reference range: <100 . Desirable range <100 mg/dL for primary prevention;   <70 mg/dL for patients with CHD or diabetic patients  with > or = 2 CHD risk factors. . [name] KitchenDL-C is now calculated using the Martin-Hopkins  calculation, which is a validated novel method providing  better accuracy than the Friedewald equation in the  estimation of LDL-C.  MaCresenciano Genret al. JAAnnamaria [id number];[phone]  [phone]  (http://education.[facility].com/faq/FAQ164)    Total CHOL/HDL Ratio 1.7 <5.0 (calc)   Non-HDL Cholesterol (Calc) 66 <130 mg/dL (calc)    Comment: For patients with diabetes plus 1 major ASCVD risk  factor, treating to a non-HDL-C goal of <100 mg/dL  (LDL-C of <70 mg/dL) is considered a therapeutic  option.    Reviewed chest x-ray from 11/22/2019 showed COPD without acute abnormality. Reviewed CT angio chest from 02/28/2019 negative for PE   I reviewed available labs, medications, imaging, studies and related documents from the EMR.  Records reviewed and summarized above.   ROS General: NAD EYES: denies acute vision changes ENMT: denies dysphagia Cardiovascular: denies chest pain, endorsed DOE Pulmonary: denies cough, denies increased SOB Abdomen: endorses fair appetite, denies constipation, endorses continence of bowel GU: denies dysuria, endorses continence of urine MSK:  denies weakness, no falls reported Skin: denies rashes or wounds Neurological: denies uncontrolled pain, denies insomnia Psych: Endorses positive mood Heme/lymph/immuno:  denies bruises, abnormal bleeding  Vitals:   01/16/21 1441  BP: 120/62  Pulse: 81  Resp: 18  SpO2: 93%   Physical Exam: General: frail appearing, cooperative, sitting in chair in his living room in NAD EYES: anicteric sclera, no discharge  ENMT: intact hearing, oral mucous membranes moist CV: S1S2 normal, +3 BLE edema Pulmonary: LCTA, no increased work of brething, no cough, on supplemental oxygen  Abdomen: no ascites GU: deferred MSK: sarcopenia, moves all extremities, ambulatory Skin: warm and dry, no rashes or wounds on visible skin Neuro: generalized weakness, no cognitive impairment Psych: non-anxious affect, A and O x 4 Hem/lymph/immuno: no widespread bruising  CURRENT PROBLEM LIST:  Patient Active Problem List   Diagnosis Date Noted  . Benign prostatic hyperplasia with nocturia 09/20/2019  . Closed fracture of first lumbar vertebra ([place]) 09/20/2019  . Idiopathic progressive neuropathy 09/20/2019  . Other fatigue 09/20/2019  . Venous insufficiency of both lower extremities 09/20/2019  . Anemia 07/25/2019  . Left inguinal hernia 02/07/2019  . Left leg pain 05/18/2018  . Pneumonia 05/18/2018  . Chronic respiratory failure with hypoxia ([place]) 01/12/2015  . COPD with acute exacerbation ([place]) 01/21/2013  . Hyperlipidemia 12/01/2011  . Hypertension 12/01/2011  . CAD (coronary artery disease) 05/23/2011  . Lung nodule 09/16/2008  . ALLERGIC RHINITIS 07/24/2007  . COPD mixed type ([place]) 07/24/2007  . BURSITIS 07/24/2007   PAST MEDICAL HISTORY:  Active Ambulatory Problems    Diagnosis Date Noted  . ALLERGIC RHINITIS 07/24/2007  . COPD mixed type ([place]) 07/24/2007  . Lung nodule 09/16/2008  . BURSITIS 07/24/2007  . CAD (coronary artery disease) 05/23/2011  . Hyperlipidemia 12/01/2011  . Hypertension 12/01/2011  . COPD with acute exacerbation ([place]) 01/21/2013  . Chronic respiratory failure with hypoxia ([place]) 01/12/2015  . Left leg pain 05/18/2018  . Pneumonia  05/18/2018  . Left inguinal hernia 02/07/2019  . Anemia 07/25/2019  . Benign prostatic hyperplasia with nocturia 09/20/2019  . Closed fracture of first lumbar vertebra ([place]) 09/20/2019  . Idiopathic progressive neuropathy 09/20/2019  . Other fatigue 09/20/2019  . Venous insufficiency of both lower extremities 09/20/2019   Resolved Ambulatory Problems    Diagnosis Date Noted  . No Resolved Ambulatory Problems   Past Medical History:  Diagnosis Date  . Arthritis   . COPD (chronic obstructive pulmonary disease) ([place])   . Dyspnea   . Dysrhythmia   . Emphysema of lung ([place])   . Low hemoglobin   . Neuromuscular disorder ([place])   . Neuropathy   . Pulmonary nodule  SOCIAL HX:  Social History   Tobacco Use  . Smoking status: Former Smoker    Packs/day: 2.00    Years: 38.00    Pack years: 76.00    Types: Cigarettes    Quit date: 08/16/1990    Years since quitting: 30.4  . Smokeless tobacco: Never Used  Substance Use Topics  . Alcohol use: Not Currently    Alcohol/week: 14.0 standard drinks    Types: 14 Shots of liquor per week    Comment: seldom   FAMILY HX:  Family History  Problem Relation Age of Onset  . COPD Mother   . Stroke Mother   . Stroke Father   . Coronary artery disease Other        [place]     ALLERGIES:  Allergies  Allergen Reactions  . Tolectin [Tolmetin Sodium] Other (See Comments)    BP low and passed out  . Montelukast Sodium Other (See Comments)    Unknown rxn;  . Nsaids   . Other Other (See Comments)  . Augmentin [Amoxicillin-Pot Clavulanate] Nausea And Vomiting    Did it involve swelling of the face/tongue/throat, SOB, or low BP? No Did it involve sudden or severe rash/hives, skin peeling, or any reaction on the inside of your mouth or nose? No Did you need to seek medical attention at a hospital or doctor's office? No When did it last happen?Last year If all above answers are "NO", may proceed with cephalosporin use.  . Neosporin  [Bacitracin-Polymyxin B] Rash     PERTINENT MEDICATIONS:  Outpatient Encounter Medications as of 01/16/2021  Medication Sig  . acetaminophen (TYLENOL) 650 MG CR tablet Take 650 mg by mouth daily. 2 tablets in the morning  . albuterol (VENTOLIN HFA) 108 (90 Base) MCG/ACT inhaler Inhale 2 puffs into the lungs every 6 (six) hours as needed for wheezing or shortness of breath.  [name] BREZTRI AEROSPHERE 160-9-4.8 MCG/ACT AERO INHALE 2 PUFFS INTO THE LUNGS IN THE MORNING AND AT BEDTIME  . DULoxetine (CYMBALTA) 20 MG capsule Take 1 capsule (20 mg total) by mouth daily. (Patient not taking: No sig reported)  . guaiFENesin (MUCINEX) 600 MG 12 hr tablet Take by mouth 2 (two) times daily.  [name] ketoconazole (NIZORAL) 2 % shampoo SMARTSIG:5 Milliliter(s) Topical Daily  . losartan (COZAAR) 25 MG tablet Take 1 tablet (25 mg total) by mouth daily. Please keep upcoming appt in April 2022 with Dr. Burt Knack before anymore refills. Thank you  . OXYGEN Inhale 3-6 L into the lungs continuous.   . polyethylene glycol powder (GLYCOLAX/MIRALAX) powder Take 17 g by mouth daily.  . pravastatin (PRAVACHOL) 40 MG tablet TAKE 1 TABLET(40 MG) BY MOUTH DAILY  . protein supplement shake (PREMIER PROTEIN) LIQD Take 2 oz by mouth daily.  . tamsulosin (FLOMAX) 0.4 MG CAPS capsule Take two capsules by mouth once daily.  [name] triamterene-hydrochlorothiazide (MAXZIDE-25) 37.5-25 MG tablet Take 1 tablet by mouth daily.  . vitamin B-12 (CYANOCOBALAMIN) 1000 MCG tablet Take 1 tablet (1,000 mcg total) by mouth daily.   No facility-administered encounter medications on file as of 01/16/2021.   Thank you for the opportunity to participate in the care of Mr. Bearden.  The palliative care team will continue to follow. Please call our office at [phone] if we can be of additional assistance.   Jari Favre, DNP, AGPCNP-BC  COVID-19 PATIENT SCREENING TOOL Asked and negative response unless otherwise noted:   Have you had symptoms of covid,  tested positive or been in contact with  someone with symptoms/positive test in the past 5-10 days?

## 2021-01-22 ENCOUNTER — Encounter: Payer: Self-pay | Admitting: Family Medicine

## 2021-01-23 ENCOUNTER — Other Ambulatory Visit: Payer: Self-pay | Admitting: *Deleted

## 2021-01-23 NOTE — Patient Outreach (Signed)
[facility]) Care Management  01/23/2021  [name] 1934-10-26 [id number]   Outgoing call placed to member, state he is doing well.  Will continue to see pulmonologist for management of COPD, next appointment on 7/6.  Denies any urgent concerns, encouraged to contact this care manager with questions.  Agrees to follow up within the next month.   Goals Addressed             This Visit's Progress    THN - Manage Fatigue (Tiredness-COPD)   On track    Timeframe:  Short-Term Goal Priority:  Medium Start Date:            5/27                 Expected End Date:   8/27                     Barriers: Knowledge Transportation    - eat healthy - get at least 7 to 8 hours of sleep at night    Why is this important?   Feeling tired or worn out is a common symptom of COPD (chronic obstructive pulmonary disease).  Learning when you feel your best and when you need rest is important.  Managing the tiredness (fatigue) will help you be active and enjoy life.     Notes:   5/27 - Referral to Care Guide for transportation options as patient report difficulty with parking then having to walk to the MD offices.  Stopped Pulmonary rehab due to distance from parking lot and building as well as Covid shutdowns.  6/10 - Confirms home visit with NP from [place].  Will continue to have home visits in effort to manage COPD.      THN - Matintain My Quality of Life   On track    Timeframe:  Long-Range Goal Priority:  High Start Date:       5/27                      Expected End Date:   11/27                     Barriers: Knowledge Transportation     Why is this important?   Having a long-term illness can be scary.  It can also be stressful for you and your caregiver.  These steps may help.    Notes:   5/27 -Education sent on management of COPD.  Discussed with member possibility of restarting pulmonary rehab, will discuss with pulmonology  6/10 - Report he  is now active with [facility] for palliative care.  Will remain with Dr. Mariea Clonts as his PCP.        [name], [place], MSN [place] [phone]

## 2021-02-10 ENCOUNTER — Ambulatory Visit: Payer: Medicare Other | Admitting: Nurse Practitioner

## 2021-02-10 ENCOUNTER — Other Ambulatory Visit: Payer: Self-pay

## 2021-02-17 NOTE — Progress Notes (Signed)
Patient ID: [name], male    DOB: May 29, 1935, 86 y.o.   MRN: [id number] PCP Dr Hulan Fess  HPI male former smoker followed for COPD/emphysema, pulmonary nodules, hypoxic respiratory failure, complicated by CAD Y0DX 8/33/82-  MM PFT 04/27/11- Echocardiogram 07/31/2018-EF 55-60%, grade 1 DD, PHN 49 mmHg ------------------------------------------------------------------------------   08/21/20-  86 year old male former smoker followed for COPD/emphysema, pulmonary nodules, hypoxic respiratory failure, complicated by CAD, Anemia,  O2 3 - 4L/[facility] Breztri, Spiriva 2.5- using up remainder Covid vax-3 Phizer Flu vax- had -----Patient feels he is about the same since visit maybe a little better.  Denies cough Discussed meds and I told him it would be ok to use up his remaining Spiriva once daily, in between doses of Breztri, if he wanted to try it. Little cough and no acute issues.  Physical Therapy helped some, but feels he never regained stamina he had before he fell about 1.5 yrs ago.  02/17/21- 86 year old male former smoker followed for COPD/emphysema, pulmonary nodules, hypoxic respiratory failure, complicated by CAD, Anemia, Peripheral Edema,  O2 3 - 4L/[facility] Breztri, Ventolin hfa,  Covid vax-3 Phizer Body weight today-138 lbs ------6 mo f/u for COPD. States he has noticed that his SOB has increased since last visit. Also has noticed any increase in sneezing first thing in the mornings.  Sitting at home O2 sats in 90's on room air, but with minimal  exertion he needs up to 5L. No abrupt change. Little mucus or cough. Hasn't used nebulizer in a long time. Worth trying again. Diuretics help ankle edema but make him weak- discussed intravascular volume loss. Suggested he retry elastic hose.   Review of Systems-see HPI   + = positive Constitutional:   No-   weight loss, night sweats fevers, no-chills, fatigue, lassitude. HEENT:   No-  headaches, difficulty swallowing,  tooth/dental problems, sore throat,       No-  sneezing, itching, ear ache, nasal congestion, post nasal drip,  CV:  No-chest pain, no-orthopnea, PND, +swelling in lower extremities, anasarca, dizziness, palpitations Resp: + shortness of breath with exertion, not at rest.               cough,  + non-productive cough,  No-  coughing up of blood.              No change color of mucus.  No- wheezing.   Skin: No-   rash or lesions. GI:  No-   heartburn, indigestion, abdominal pain, nausea,   GU: No-   dysuria,  MS:  No-   joint pain or swelling.  Neuro-  Psych:  No- change in mood or affect. No acute depression or anxiety.  No memory loss.  OBJ-    + looks frail General- Alert, Oriented, Affect-appropriate, Distress- none acute. + O2 2, slender Skin-  Lymphadenopathy- none Head- atraumatic            Eyes- Gross vision intact, PERRLA, conjunctivae clear secretions            Ears- Hearing, canals-normal            Nose- Clear, no-Septal dev, mucus, polyps, erosion, perforation             Throat- Mallampati II , mucosa clear , drainage- none, tonsils- atrophic.  Neck- flexible , trachea midline, no stridor , thyroid nl, carotid no bruit Chest - symmetrical excursion , unlabored           Heart/CV- RRR , no murmur ,  no gallop  , no rub, nl s1 s2                           - JVD- none , edema 2-3+, stasis changes- none, varices- none           Lung-  Distant+, wheeze+slight, unlabored, cough-none, dullness-none, rub- none           Chest wall-  Abd-  Br/ Gen/ Rectal- Not done, not indicated Extrem- cyanosis- none, clubbing, none, atrophy- none, strength- nl.  Neuro- grossly intact to observation

## 2021-02-18 ENCOUNTER — Ambulatory Visit (INDEPENDENT_AMBULATORY_CARE_PROVIDER_SITE_OTHER): Payer: Medicare Other | Admitting: Internal Medicine

## 2021-02-18 ENCOUNTER — Encounter: Payer: Self-pay | Admitting: Internal Medicine

## 2021-02-18 ENCOUNTER — Other Ambulatory Visit: Payer: Self-pay

## 2021-02-18 ENCOUNTER — Ambulatory Visit (INDEPENDENT_AMBULATORY_CARE_PROVIDER_SITE_OTHER): Payer: Medicare Other

## 2021-02-18 VITALS — BP 114/72 | HR 83 | Ht 69.0 in | Wt 138.0 lb

## 2021-02-18 DIAGNOSIS — J9611 Chronic respiratory failure with hypoxia: Secondary | ICD-10-CM | POA: Diagnosis not present

## 2021-02-18 DIAGNOSIS — J449 Chronic obstructive pulmonary disease, unspecified: Secondary | ICD-10-CM | POA: Diagnosis not present

## 2021-02-18 DIAGNOSIS — I25119 Atherosclerotic heart disease of native coronary artery with unspecified angina pectoris: Secondary | ICD-10-CM

## 2021-02-18 MED ORDER — IPRATROPIUM-ALBUTEROL 0.5-2.5 (3) MG/3ML IN SOLN: 3.0000 mL | Freq: Four times a day (QID) | RESPIRATORY_TRACT | 11 refills | Status: DC | PRN

## 2021-02-18 NOTE — Patient Instructions (Signed)
Order- DME [facility]  1compressor nebulizer machine  dx COPD mixed type                                   2)Duoneb neb solution 75 ml   Ref x 12,  1 neb every 6 hours as needed.                                     3)Need trolly/ wheeled carrier to help move portable O2 around when he walks.                                   4) Continue O2 3-5L/min      dx chronic respiratory failure with hypoxia  Order- CXR   dx COPD mixed type

## 2021-02-18 NOTE — Assessment & Plan Note (Signed)
Remains dependent on O2. It might help to have a rolling carrier.

## 2021-02-18 NOTE — Assessment & Plan Note (Signed)
Global Gold severe, with hypoxic failure, mainly emphysema. Plan- Continue Breztri. Replace old compressor nebulizer and try duoneb up to 4 times daily, but especially first thing in morning and before exertion/ trips out of house

## 2021-02-19 ENCOUNTER — Other Ambulatory Visit: Payer: Self-pay | Admitting: *Deleted

## 2021-02-19 NOTE — Patient Outreach (Addendum)
[facility]) Care Management  02/19/2021  [name] 08-27-1934 [id number]   Outgoing call placed to member, state he is managing COPD although he feels it is slightly worse.  Denies any current shortness of breath or cough, continues to be active with [facility] palliative.  Denies any urgent concerns, encouraged to contact this care manager with questions.  Agrees to follow up within the next 2 months.   Goals Addressed             This Visit's Progress    THN - Manage Fatigue (Tiredness-COPD)   On track    Timeframe:  Short-Term Goal Priority:  Medium Start Date:            5/27                 Expected End Date:   8/27                     Barriers: Knowledge Transportation    - eat healthy - get at least 7 to 8 hours of sleep at night    Why is this important?   Feeling tired or worn out is a common symptom of COPD (chronic obstructive pulmonary disease).  Learning when you feel your best and when you need rest is important.  Managing the tiredness (fatigue) will help you be active and enjoy life.     Notes:   5/27 - Referral to Care Guide for transportation options as patient report difficulty with parking then having to walk to the MD offices.  Stopped Pulmonary rehab due to distance from parking lot and building as well as Covid shutdowns.  6/10 - Confirms home visit with NP from [place].  Will continue to have home visits in effort to manage COPD.  7/7 - Will be starting new nebulizer treatment.  Will also have rolling oxygen tanks to make transport easier.        THN - Matintain My Quality of Life   On track    Timeframe:  Long-Range Goal Priority:  High Start Date:       5/27                      Expected End Date:   11/27                     Barriers: Knowledge Transportation     Why is this important?   Having a long-term illness can be scary.  It can also be stressful for you and your caregiver.  These steps may  help.    Notes:   5/27 -Education sent on management of COPD.  Discussed with member possibility of restarting pulmonary rehab, will discuss with pulmonology  6/10 - Report he is now active with [facility] for palliative care.  Will remain with Dr. Mariea Clonts as his PCP.  7/7 - Was seen by pulmonology yesterday, report he feels COPD continues to progress.  Will have interventions in place to help with slowing progress.  Will follow up with provider in 6 months.        [name], [place], MSN [place] [phone]

## 2021-02-20 ENCOUNTER — Telehealth: Payer: Self-pay | Admitting: Internal Medicine

## 2021-02-20 NOTE — Telephone Encounter (Signed)
[facility] called and said Duoneb order needs to be edited to include quantity of 250, and to state "as needed for ...". Dr. Annamaria Boots please advise.

## 2021-02-23 ENCOUNTER — Telehealth: Payer: Self-pay | Admitting: Internal Medicine

## 2021-02-23 MED ORDER — IPRATROPIUM-ALBUTEROL 0.5-2.5 (3) MG/3ML IN SOLN: 3.0000 mL | Freq: Four times a day (QID) | RESPIRATORY_TRACT | 3 refills | Status: AC | PRN

## 2021-02-23 MED ORDER — IPRATROPIUM-ALBUTEROL 0.5-2.5 (3) MG/3ML IN SOLN: 3.0000 mL | Freq: Four times a day (QID) | RESPIRATORY_TRACT | 3 refills | Status: DC | PRN

## 2021-02-23 NOTE — Telephone Encounter (Signed)
Ok to change Duoneb order for [name] as they requested.

## 2021-02-23 NOTE — Telephone Encounter (Signed)
Order has been updated and sent back to [facility].   Will close encounter.

## 2021-02-23 NOTE — Telephone Encounter (Signed)
Call returned to [facility], confirmed patient DOB. She states the duoneb has the wrong quantity so they will not be able to fill the order. I inquired as to what the quantity needed to be she said 450 ml. I asked if we updated the order will they be able to fill it. Estill Bamberg states yes. Made aware I will send in a new with the requested quantity. Voiced understanding.

## 2021-02-23 NOTE — Telephone Encounter (Signed)
Estill Bamberg stated that the Duoneb order sent to them was still not correct. She said that she would like for someone to call her so she can explain what needs to be done. Pls regard; [phone]

## 2021-03-12 DIAGNOSIS — R5383 Other fatigue: Secondary | ICD-10-CM | POA: Diagnosis not present

## 2021-03-12 DIAGNOSIS — G603 Idiopathic progressive neuropathy: Secondary | ICD-10-CM | POA: Diagnosis not present

## 2021-03-12 DIAGNOSIS — I872 Venous insufficiency (chronic) (peripheral): Secondary | ICD-10-CM | POA: Diagnosis not present

## 2021-03-12 DIAGNOSIS — N401 Enlarged prostate with lower urinary tract symptoms: Secondary | ICD-10-CM | POA: Diagnosis not present

## 2021-03-12 DIAGNOSIS — J449 Chronic obstructive pulmonary disease, unspecified: Secondary | ICD-10-CM | POA: Diagnosis not present

## 2021-03-12 DIAGNOSIS — B351 Tinea unguium: Secondary | ICD-10-CM | POA: Diagnosis not present

## 2021-03-17 DIAGNOSIS — Z85828 Personal history of other malignant neoplasm of skin: Secondary | ICD-10-CM | POA: Diagnosis not present

## 2021-03-17 DIAGNOSIS — R609 Edema, unspecified: Secondary | ICD-10-CM | POA: Diagnosis not present

## 2021-03-17 DIAGNOSIS — L578 Other skin changes due to chronic exposure to nonionizing radiation: Secondary | ICD-10-CM | POA: Diagnosis not present

## 2021-03-17 DIAGNOSIS — D225 Melanocytic nevi of trunk: Secondary | ICD-10-CM | POA: Diagnosis not present

## 2021-03-17 DIAGNOSIS — Z86018 Personal history of other benign neoplasm: Secondary | ICD-10-CM | POA: Diagnosis not present

## 2021-03-17 DIAGNOSIS — L570 Actinic keratosis: Secondary | ICD-10-CM | POA: Diagnosis not present

## 2021-03-17 DIAGNOSIS — D2261 Melanocytic nevi of right upper limb, including shoulder: Secondary | ICD-10-CM | POA: Diagnosis not present

## 2021-03-17 DIAGNOSIS — L219 Seborrheic dermatitis, unspecified: Secondary | ICD-10-CM | POA: Diagnosis not present

## 2021-03-17 DIAGNOSIS — L821 Other seborrheic keratosis: Secondary | ICD-10-CM | POA: Diagnosis not present

## 2021-03-17 DIAGNOSIS — B360 Pityriasis versicolor: Secondary | ICD-10-CM | POA: Diagnosis not present

## 2021-04-16 ENCOUNTER — Other Ambulatory Visit: Payer: Self-pay | Admitting: *Deleted

## 2021-04-16 DIAGNOSIS — Z23 Encounter for immunization: Secondary | ICD-10-CM | POA: Diagnosis not present

## 2021-04-16 NOTE — Patient Outreach (Signed)
[facility]) Care Management  [place]  04/16/2021   [name] 1934/12/27 [id number]   Encounter Medications:  Outpatient Encounter Medications as of 04/16/2021  Medication Sig   acetaminophen (TYLENOL) 650 MG CR tablet Take 650 mg by mouth daily. 2 tablets in the morning   albuterol (VENTOLIN HFA) 108 (90 Base) MCG/ACT inhaler Inhale 2 puffs into the lungs every 6 (six) hours as needed for wheezing or shortness of breath.   BREZTRI AEROSPHERE 160-9-4.8 MCG/ACT AERO INHALE 2 PUFFS INTO THE LUNGS IN THE MORNING AND AT BEDTIME   DULoxetine (CYMBALTA) 20 MG capsule Take 1 capsule (20 mg total) by mouth daily.   ipratropium-albuterol (DUONEB) 0.5-2.5 (3) MG/3ML SOLN Take 3 mLs by nebulization every 6 (six) hours as needed (for SOB AND wheezing every 6 hours AND PRN).   ketoconazole (NIZORAL) 2 % shampoo SMARTSIG:5 Milliliter(s) Topical Daily   losartan (COZAAR) 25 MG tablet Take 1 tablet (25 mg total) by mouth daily. Please keep upcoming appt in April 2022 with Dr. Burt Knack before anymore refills. Thank you   OXYGEN Inhale 3-6 L into the lungs continuous.    polyethylene glycol powder (GLYCOLAX/MIRALAX) powder Take 17 g by mouth daily.   pravastatin (PRAVACHOL) 40 MG tablet TAKE 1 TABLET(40 MG) BY MOUTH DAILY   protein supplement shake (PREMIER PROTEIN) LIQD Take 2 oz by mouth daily.   tamsulosin (FLOMAX) 0.4 MG CAPS capsule Take two capsules by mouth once daily. (Patient taking differently: 0.4 mg. Take two capsules by mouth once daily.)   vitamin B-12 (CYANOCOBALAMIN) 1000 MCG tablet Take 1 tablet (1,000 mcg total) by mouth daily.   No facility-administered encounter medications on file as of 04/16/2021.    Functional Status:  No flowsheet data found.  Fall/Depression Screening: Fall Risk  01/09/2021 09/25/2020 01/21/2020  Falls in the past year? 0 0 0  Number falls in past yr: 0 - 0  Injury with Fall? 0 0 0  Comment - - -  Risk for fall due to : Other  (Comment) - -  Follow up Falls evaluation completed;Falls prevention discussed - -   PHQ 2/9 Scores 01/09/2021 09/25/2020 11/07/2019 10/18/2019 09/20/2019  PHQ - 2 Score 0 0 0 0 0  PHQ- 9 Score - - 2 - -    Assessment:   Care Plan Care Plan : COPD (Adult)  Updates made by [name], RN since 04/16/2021 12:00 AM     Problem: Disease Progression (COPD)      Long-Range Goal: Disease Progression Minimized or Managed   Start Date: 01/09/2021  Expected End Date: 07/08/2021  This Visit's Progress: On track  Recent Progress: On track  Note:   Evidence-based guidance:  Identify current smoking/tobacco use; provide smoking cessation intervention.  Assess symptom control by the frequency and type of symptoms, reliever use and activity limitation at every encounter.  Assess risk for exacerbation (flare up) by evaluating spirometry, pulse oximetry, reliever use, presentation of symptoms and activity limitation; anticipate treatment adjustment based on risks and resources.  Develop and/or review and reinforce use of COPD rescue (action) plan even when symptoms are controlled or infrequent.  Ask patient to bring inhaler to all visits; assess and reinforce correct technique; address barriers to proper inhaler use, such as older age, use of multiple devices and lack of understanding.   Identify symptom triggers, such as smoking, virus, weather change, emotional upset, exercise, obesity and environmental allergen; consider reduction of work-exposure versus elimination to avoid compromising employment.  Correlate  presentation to comorbidity, such as diabetes, heart failure, obstructive sleep apnea, depression and anxiety, which may worsen symptoms.  Prepare for individualized pharmacologic therapy that may include LABA (long-acting beta-2 agonist), LAMA (long-acting muscarinic antagonist), SABA (short-acting beta-2 agonist) oral or inhaled corticosteroid.  Promote participation in pulmonary rehabilitation for  breathing exercises, skills training, improved exercise capacity, mood and quality of life; address barriers to participation.  Promote physical activity or exercise to improve or maintain exercise capacity, based on tolerance that may include walking, water exercise, cycling or limb muscle strength training.  Promote use of energy conservation and activity pacing techniques.  Promote use of breathing and coughing techniques, such as inspiratory muscle training, pursed-lip breathing, diaphragmatic breathing, pranayama yoga breathing or huff cough.  Screen for malnutrition risk factors, such as unintentional weight loss and poor oral intake; refer to dietitian if identified.  Consider recommendation for oral drink supplement or multivitamin and mineral supplements if suspect inadequate oral intake or micronutrient deficiencies.   Screen for obstructive sleep apnea; prepare patient for polysomnography based on risk and presentation.  Prepare patient for use of long-term oxygen and noninvasive ventilation to relieve hypercapnia, hypoxemia, obstructive sleep apnea and reduce work of breathing.  Prepare patient with worsening disease for surgical interventions that may include bronchoscopy, lung volume reduction surgery, bullectomy or lung transplantation.   Notes:     Problem: Symptom Exacerbation (COPD)      Goal: Symptom Exacerbation Prevented or Minimized Completed 04/16/2021  Start Date: 01/09/2021  Expected End Date: 04/09/2021  Recent Progress: On track  Priority: High  Note:   Evidence-based guidance:  Monitor for signs of respiratory infection, including changes in sputum color, volume and thickness, as well as fever.  Encourage infection prevention strategies that may include prophylactic antibiotic therapy for patients with history of frequent exacerbations or antibiotic administration during exacerbation based on presentation, risk and benefit.  Encourage receipt of influenza and  pneumococcal vaccine.  Prepare patient for use of home long-term oxygen therapy in presence of sever resting hypoxemia.  Prepare patients for laboratory studies or diagnostic exams, such as spirometry, pulse oximetry and arterial blood gas based on current symptoms, risk factors and presentation.  Assess barriers and manage adherence, including inhaler technique and persistent trigger exposure; encourage adherence, even when symptoms are controlled or infrequent.  Assess and monitor for signs/symptoms of psychosocial concerns, such as shortness of breath-anxiety cycle or depression that may impact stability of symptoms.  Identify economic resources, sociocultural beliefs, social factors and health literacy that may interfere with adherence.  Promote lifestyle changes when needed, including regular physical activity based on tolerance, weight loss, healthy eating and stress management.  Consider referral to nurse or community health worker or home-visiting program for intensive support and education (disease-management program).  Increase frequency of follow-up following exacerbation or hospitalization; consider transition of care interventions, such as hospital visit, home visit, telephone follow-up, review of discharge summary and resource referrals.   Notes:     Task: Identify and Minimize Risk of COPD Exacerbation Completed 04/16/2021  Due Date: 04/09/2021  Note:   Care Management Activities:    - rescue (action) plan reviewed - signs/symptoms of infection reviewed - signs/symptoms of worsening disease assessed - treatment plan reviewed    Notes:       Goals Addressed             This Visit's Progress    COMPLETED: [facility] - Manage Fatigue (Tiredness-COPD)       Timeframe:  Short-Term Goal Priority:  Medium Start Date:            5/27                 Expected End Date:   8/27                     Barriers: Knowledge Transportation    - eat healthy - get at least 7 to 8 hours  of sleep at night    Why is this important?   Feeling tired or worn out is a common symptom of COPD (chronic obstructive pulmonary disease).  Learning when you feel your best and when you need rest is important.  Managing the tiredness (fatigue) will help you be active and enjoy life.     Notes:   5/27 - Referral to Care Guide for transportation options as patient report difficulty with parking then having to walk to the MD offices.  Stopped Pulmonary rehab due to distance from parking lot and building as well as Covid shutdowns.  6/10 - Confirms home visit with NP from [place].  Will continue to have home visits in effort to manage COPD.  7/7 - Will be starting new nebulizer treatment.  Will also have rolling oxygen tanks to make transport easier.       [facility] - [facility] My Quality of Life   On track    Timeframe:  Long-Range Goal Priority:  High Start Date:       5/27                      Expected End Date:   11/27                     Barriers: Knowledge Transportation     Why is this important?   Having a long-term illness can be scary.  It can also be stressful for you and your caregiver.  These steps may help.    Notes:   5/27 -Education sent on management of COPD.  Discussed with member possibility of restarting pulmonary rehab, will discuss with pulmonology  6/10 - Report he is now active with [facility] for palliative care.  Will remain with Dr. Mariea Clonts as his PCP.  7/7 - Was seen by pulmonology yesterday, report he feels COPD continues to progress.  Will have interventions in place to help with slowing progress.  Will follow up with provider in 6 months.  9/1 - Report he feels there is no real change in condition. Has remained stable, intermittent shortness of breath, none noted at this time.        Plan:  Will place referral to Health coach, will notify PCP.  [name], [place], MSN [place] [phone]

## 2021-04-17 ENCOUNTER — Other Ambulatory Visit: Payer: Self-pay | Admitting: *Deleted

## 2021-05-05 DIAGNOSIS — Z23 Encounter for immunization: Secondary | ICD-10-CM | POA: Diagnosis not present

## 2021-05-12 ENCOUNTER — Encounter: Payer: Self-pay | Admitting: *Deleted

## 2021-05-12 ENCOUNTER — Other Ambulatory Visit: Payer: Self-pay | Admitting: *Deleted

## 2021-05-12 NOTE — Patient Outreach (Signed)
[facility]) Care Management  [place]  05/12/2021   [name] 1935/01/04 [id number]  Subjective: Successful telephone outreach call to patient. HIPAA identifiers obtained. Patient reports that his COPD has remained about the same since his last visit with Dr. Annamaria Boots on 02/18/21. He continues to have intermittent SOB, he uses his rescue medication as needed, and remains on 3-5 liters of continuous oxygen. Patient states that his home environment is safe and he is well supported by his wife and family. He denies having any recent falls or needs for DME. Patient reports having no difficulty driving to his provider appointments but it is difficult for him to walk long distances to get into his provider appointments. Discussed calling the provider's office to inform them he would need assistance into the building once he arrives. Nurse discussed with patient his health goals and his health and wellness needs which were documented in the Epic system. Patient did not have any further questions or concerns today.   Encounter Medications:  Outpatient Encounter Medications as of 05/12/2021  Medication Sig   acetaminophen (TYLENOL) 650 MG CR tablet Take 650 mg by mouth daily. 2 tablets in the morning   albuterol (VENTOLIN HFA) 108 (90 Base) MCG/ACT inhaler Inhale 2 puffs into the lungs every 6 (six) hours as needed for wheezing or shortness of breath.   BREZTRI AEROSPHERE 160-9-4.8 MCG/ACT AERO INHALE 2 PUFFS INTO THE LUNGS IN THE MORNING AND AT BEDTIME   DULoxetine (CYMBALTA) 20 MG capsule Take 1 capsule (20 mg total) by mouth daily.   ipratropium-albuterol (DUONEB) 0.5-2.5 (3) MG/3ML SOLN Take 3 mLs by nebulization every 6 (six) hours as needed (for SOB AND wheezing every 6 hours AND PRN).   ketoconazole (NIZORAL) 2 % shampoo SMARTSIG:5 Milliliter(s) Topical Daily   losartan (COZAAR) 25 MG tablet Take 1 tablet (25 mg total) by mouth daily. Please keep upcoming appt in April  2022 with Dr. Burt Knack before anymore refills. Thank you   OXYGEN Inhale 3-6 L into the lungs continuous.    polyethylene glycol powder (GLYCOLAX/MIRALAX) powder Take 17 g by mouth daily.   pravastatin (PRAVACHOL) 40 MG tablet TAKE 1 TABLET(40 MG) BY MOUTH DAILY   protein supplement shake (PREMIER PROTEIN) LIQD Take 2 oz by mouth daily.   tamsulosin (FLOMAX) 0.4 MG CAPS capsule Take two capsules by mouth once daily. (Patient taking differently: 0.4 mg. Take two capsules by mouth once daily.)   vitamin B-12 (CYANOCOBALAMIN) 1000 MCG tablet Take 1 tablet (1,000 mcg total) by mouth daily.   No facility-administered encounter medications on file as of 05/12/2021.    Functional Status:  No flowsheet data found.  Fall/Depression Screening: Fall Risk  05/12/2021 01/09/2021 09/25/2020  Falls in the past year? 0 0 0  Number falls in past yr: 0 0 -  Injury with Fall? 0 0 0  Comment - - -  Risk for fall due to : Impaired mobility;Other (Comment);Impaired balance/gait Other (Comment) -  Risk for fall due to: Comment imparied mobility due to respiratory issues - -  Follow up Falls prevention discussed;Education provided;Falls evaluation completed Falls evaluation completed;Falls prevention discussed -   PHQ 2/9 Scores 05/12/2021 01/09/2021 09/25/2020 11/07/2019 10/18/2019 09/20/2019  PHQ - 2 Score 0 0 0 0 0 0  PHQ- 9 Score - - - 2 - -    Assessment:   Care Plan Care Plan : COPD (Adult)  Updates made by Michiel Cowboy, RN since 05/12/2021 12:00 AM  Problem: Disease Progression (COPD)   Priority: High     Long-Range Goal: Disease Progression Minimized or Managed   Start Date: 05/12/2021  Expected End Date: 05/15/2022  Recent Progress: On track  Note:   Evidence-based guidance:  Identify current smoking/tobacco use; provide smoking cessation intervention.  Assess symptom control by the frequency and type of symptoms, reliever use and activity limitation at every encounter.  Assess risk for  exacerbation (flare up) by evaluating spirometry, pulse oximetry, reliever use, presentation of symptoms and activity limitation; anticipate treatment adjustment based on risks and resources.  Develop and/or review and reinforce use of COPD rescue (action) plan even when symptoms are controlled or infrequent.  Ask patient to bring inhaler to all visits; assess and reinforce correct technique; address barriers to proper inhaler use, such as older age, use of multiple devices and lack of understanding.   Identify symptom triggers, such as smoking, virus, weather change, emotional upset, exercise, obesity and environmental allergen; consider reduction of work-exposure versus elimination to avoid compromising employment.  Correlate presentation to comorbidity, such as diabetes, heart failure, obstructive sleep apnea, depression and anxiety, which may worsen symptoms.  Prepare for individualized pharmacologic therapy that may include LABA (long-acting beta-2 agonist), LAMA (long-acting muscarinic antagonist), SABA (short-acting beta-2 agonist) oral or inhaled corticosteroid.  Promote participation in pulmonary rehabilitation for breathing exercises, skills training, improved exercise capacity, mood and quality of life; address barriers to participation.  Promote physical activity or exercise to improve or maintain exercise capacity, based on tolerance that may include walking, water exercise, cycling or limb muscle strength training.  Promote use of energy conservation and activity pacing techniques.  Promote use of breathing and coughing techniques, such as inspiratory muscle training, pursed-lip breathing, diaphragmatic breathing, pranayama yoga breathing or huff cough.  Screen for malnutrition risk factors, such as unintentional weight loss and poor oral intake; refer to dietitian if identified.  Consider recommendation for oral drink supplement or multivitamin and mineral supplements if suspect inadequate  oral intake or micronutrient deficiencies.   Screen for obstructive sleep apnea; prepare patient for polysomnography based on risk and presentation.  Prepare patient for use of long-term oxygen and noninvasive ventilation to relieve hypercapnia, hypoxemia, obstructive sleep apnea and reduce work of breathing.  Prepare patient with worsening disease for surgical interventions that may include bronchoscopy, lung volume reduction surgery, bullectomy or lung transplantation.   Notes:     Task: Alleviate Barriers to COPD Management   Due Date: 05/15/2022  Note:   Care Management Activities:    - barriers to treatment managed - individualized medical nutrition therapy provided - medication-adherence assessment completed - rescue (action) plan reviewed - self-awareness of symptom triggers encouraged    Notes:     Problem: Psychological Adjustment to Diagnosis (COPD)        Goals Addressed             This Visit's Progress    [facility]) Track and Manage My Symptoms-COPD       Timeframe:  Long-Range Goal Priority:  High Start Date:  05/12/21                           Expected End Date: 05/15/22                 Follow Up Date 08/14/21    - develop a rescue plan - eliminate symptom triggers at home - follow rescue plan if symptoms flare-up - keep follow-up appointments  -Discussed  using DOUNEB first thing in the morning to open airways to get his day started and as needed -Encouraged patient to continue to walk around his home and to go out 2 times a week  as tolerated to maintain strength and enjoy the outdoors -Discussed increasing the amount of high protein Equated as needed to maintain nutrition and to increase the amount of fluid he drinks to stay hydrated -Discussed calling your provider's office to inform them you will need assistance getting into the building. Let them know you will call them when you arrive. They will come to your car with a wheelchair to transport you into the  building   Why is this important?   Tracking your symptoms and other information about your health helps your doctor plan your care.  Write down the symptoms, the time of day, what you were doing and what medicine you are taking.  You will soon learn how to manage your symptoms.     Notes: 05/12/21: Patient feels that his COPD symptoms of SOB and fatigue have remained about the same since his last visit with Dr. Annamaria Boots on 02/18/21. Nurse will send patient COPD education with color zones and rescue action plans. Discussed goals as written above.     COMPLETED: [facility] - Manage Fatigue (Tiredness-COPD)       Timeframe:  Short-Term Goal Priority:  Medium Start Date:      5/27                 Expected End Date:   8/27                     Barriers: Knowledge Transportation    - eat healthy - get at least 7 to 8 hours of sleep at night    Why is this important?   Feeling tired or worn out is a common symptom of COPD (chronic obstructive pulmonary disease).  Learning when you feel your best and when you need rest is important.  Managing the tiredness (fatigue) will help you be active and enjoy life.     Notes:   5/27 - Referral to Care Guide for transportation options as patient report difficulty with parking then having to walk to the MD offices.  Stopped Pulmonary rehab due to distance from parking lot and building as well as Covid shutdowns.  6/10 - Confirms home visit with NP from [place].  Will continue to have home visits in effort to manage COPD.  7/7 - Will be starting new nebulizer treatment.  Will also have rolling oxygen tanks to make transport easier.    Resolved due to duplicate goals 3/54/65     COMPLETED: [facility] - [facility] My Quality of Life       Timeframe:  Long-Range Goal Priority:  High Start Date:       5/27                      Expected End Date:   11/27                     Barriers: Knowledge Transportation     Why is this important?   Having a long-term  illness can be scary.  It can also be stressful for you and your caregiver.  These steps may help.    Notes:   5/27 -Education sent on management of COPD.  Discussed with member possibility of restarting pulmonary rehab, will discuss with pulmonology  6/10 - Report he is now active with [facility] for palliative care.  Will remain with Dr. Mariea Clonts as his PCP.  7/7 - Was seen by pulmonology yesterday, report he feels COPD continues to progress.  Will have interventions in place to help with slowing progress.  Will follow up with provider in 6 months.  9/1 - Report he feels there is no real change in condition. Has remained stable, intermittent shortness of breath, none noted at this time.       Plan: RN Health Coach will send PCP a barrier letter and today's assessment note, will send patient COPD education with color zones and rescue action plans, and will call patient within the month of December. Follow-up: Patient agrees to Care Plan and Follow-up.  Emelia Loron RN, BSN [place] [phone] [email or web address]

## 2021-05-12 NOTE — Patient Instructions (Addendum)
Goals Addressed             This Visit's Progress    [facility]) Track and Manage My Symptoms-COPD       Timeframe:  Long-Range Goal Priority:  High Start Date:  05/12/21                           Expected End Date: 05/15/22                 Follow Up Date 08/14/21    - develop a rescue plan - eliminate symptom triggers at home - follow rescue plan if symptoms flare-up - keep follow-up appointments  -Discussed using DOUNEB first thing in the morning to open airways to get his day started and as needed -Encouraged patient to continue to walk around his home and to go out 2 times a week  as tolerated to maintain strength and enjoy the outdoors -Discussed increasing the amount of high protein Equated as needed to maintain nutrition and to increase the amount of fluid he drinks to stay hydrated -Discussed calling your provider's office to inform them you will need assistance getting into the building. Let them know you will call them when you arrive. They will come to your car with a wheelchair to transport you into the building   Why is this important?   Tracking your symptoms and other information about your health helps your doctor plan your care.  Write down the symptoms, the time of day, what you were doing and what medicine you are taking.  You will soon learn how to manage your symptoms.     Notes: 05/12/21: Patient feels that his COPD symptoms of SOB and fatigue have remained about the same since his last visit with Dr. Annamaria [name] on 02/18/21. Nurse will send patient COPD education with color zones and rescue action plans. Discussed goals as written above.     COMPLETED: [facility] - Manage Fatigue (Tiredness-COPD)       Timeframe:  Short-Term Goal Priority:  Medium Start Date:      5/27                 Expected End Date:   8/27                     Barriers: Knowledge Transportation    - eat healthy - get at least 7 to 8 hours of sleep at night    Why is this important?   Feeling tired or  worn out is a common symptom of COPD (chronic obstructive pulmonary disease).  Learning when you feel your best and when you need rest is important.  Managing the tiredness (fatigue) will help you be active and enjoy life.     Notes:   5/27 - Referral to Care Guide for transportation options as patient report difficulty with parking then having to walk to the MD offices.  Stopped Pulmonary rehab due to distance from parking lot and building as well as Covid shutdowns.  6/10 - Confirms home visit with NP from [name].  Will continue to have home visits in effort to manage COPD.  7/7 - Will be starting new nebulizer treatment.  Will also have rolling oxygen tanks to make transport easier.    Resolved due to duplicate goals 2/84/13     COMPLETED: [facility] - [place] My Quality of Life       Timeframe:  Long-Range Goal Priority:  High  Start Date:       5/27                      Expected End Date:   11/27                     Barriers: Knowledge Transportation     Why is this important?   Having a long-term illness can be scary.  It can also be stressful for you and your caregiver.  These steps may help.    Notes:   5/27 -Education sent on management of COPD.  Discussed with member possibility of restarting pulmonary rehab, will discuss with pulmonology  6/10 - Report he is now active with [facility] for palliative care.  Will remain with Dr. Mariea [name] as his PCP.  7/7 - Was seen by pulmonology yesterday, report he feels COPD continues to progress.  Will have interventions in place to help with slowing progress.  Will follow up with provider in 6 months.  9/1 - Report he feels there is no real change in condition. Has remained stable, intermittent shortness of breath, none noted at this time.

## 2021-06-03 DIAGNOSIS — Z961 Presence of intraocular lens: Secondary | ICD-10-CM | POA: Diagnosis not present

## 2021-06-03 DIAGNOSIS — H04123 Dry eye syndrome of bilateral lacrimal glands: Secondary | ICD-10-CM | POA: Diagnosis not present

## 2021-06-03 DIAGNOSIS — H524 Presbyopia: Secondary | ICD-10-CM | POA: Diagnosis not present

## 2021-06-03 DIAGNOSIS — H5203 Hypermetropia, bilateral: Secondary | ICD-10-CM | POA: Diagnosis not present

## 2021-06-16 IMAGING — DX DG CHEST 2V
2 series · 2 of 2 positions shown · non-contrast
Comparison: 02/28/2019

CLINICAL DATA: COPD

EXAM:
CHEST - 2 VIEW

[chest pa]
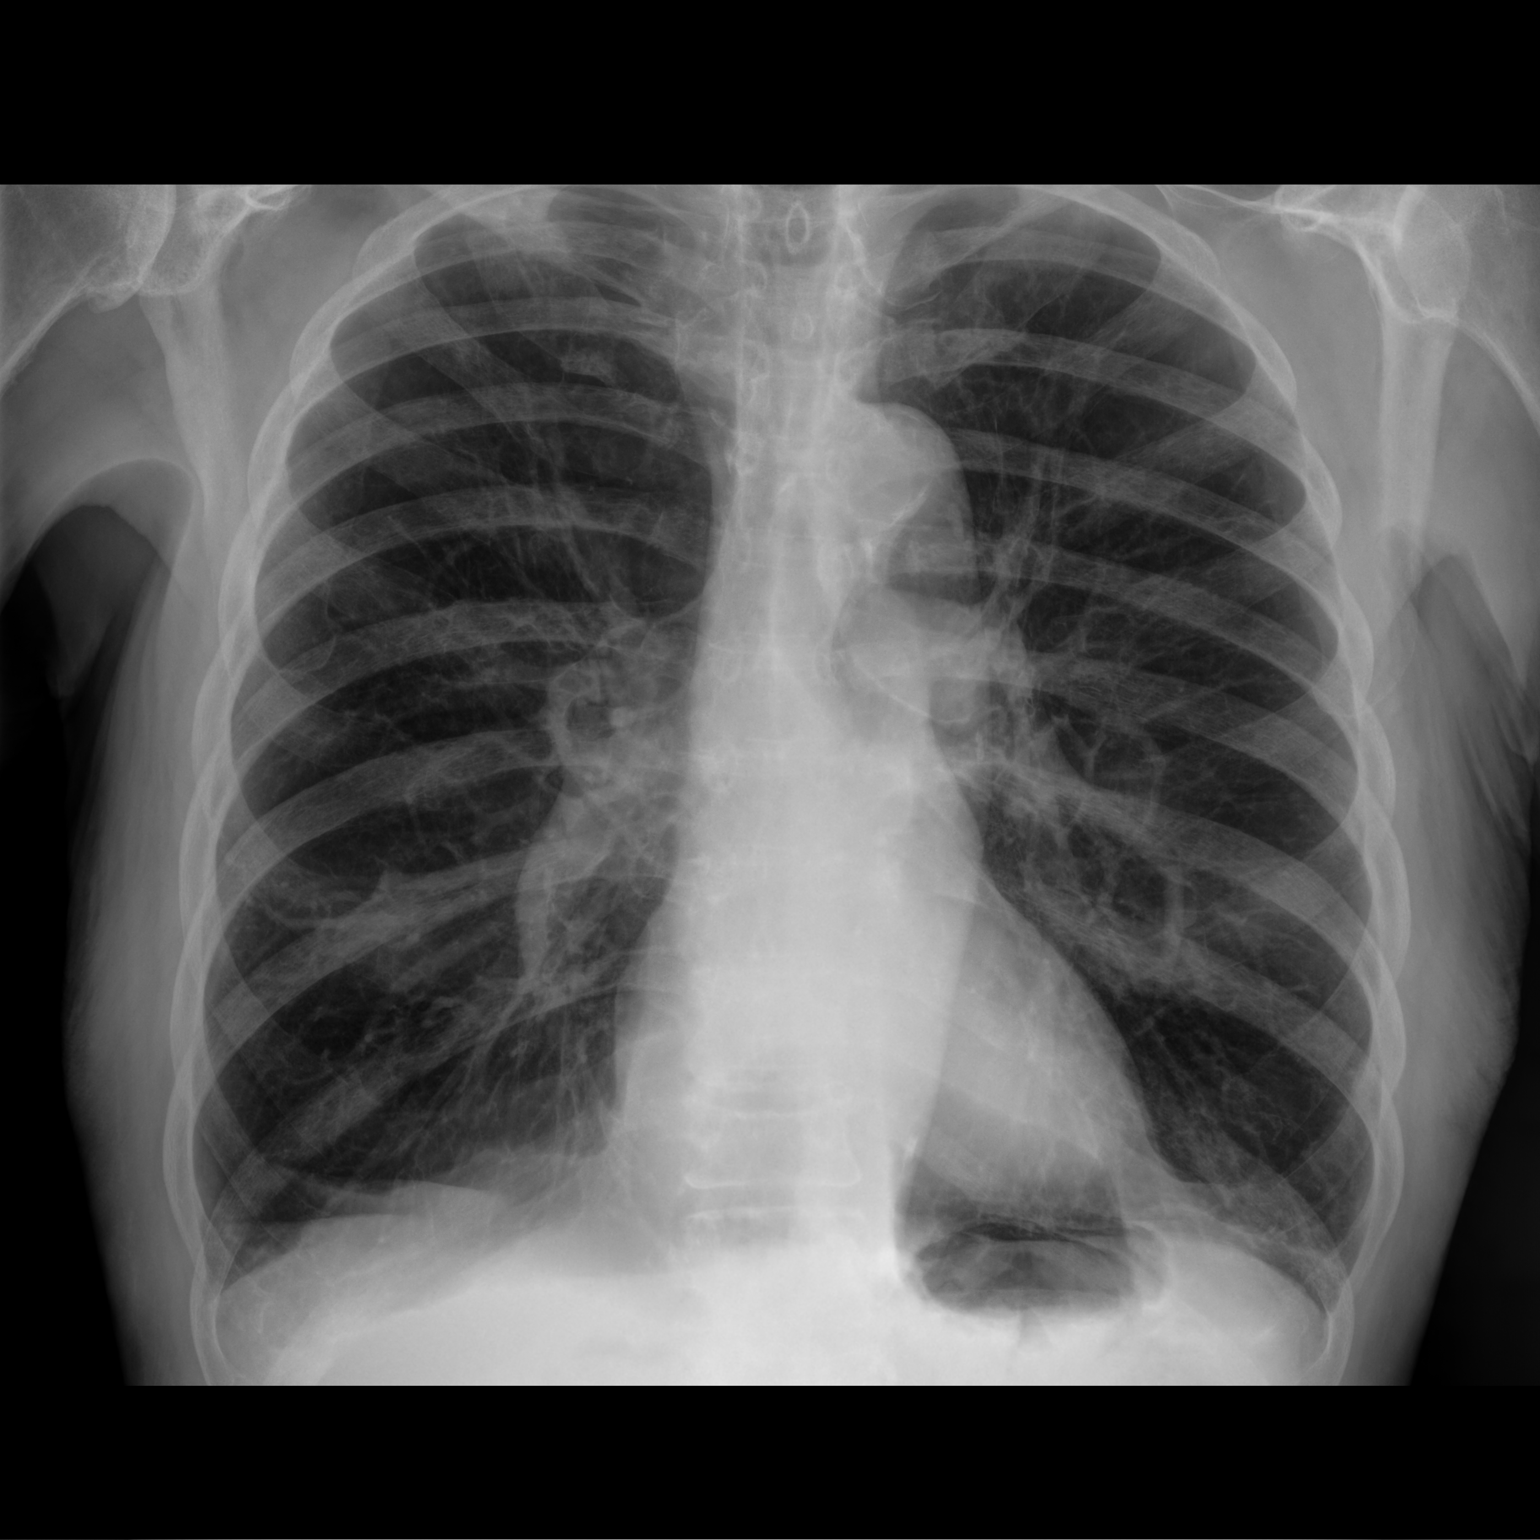

[chest lat]
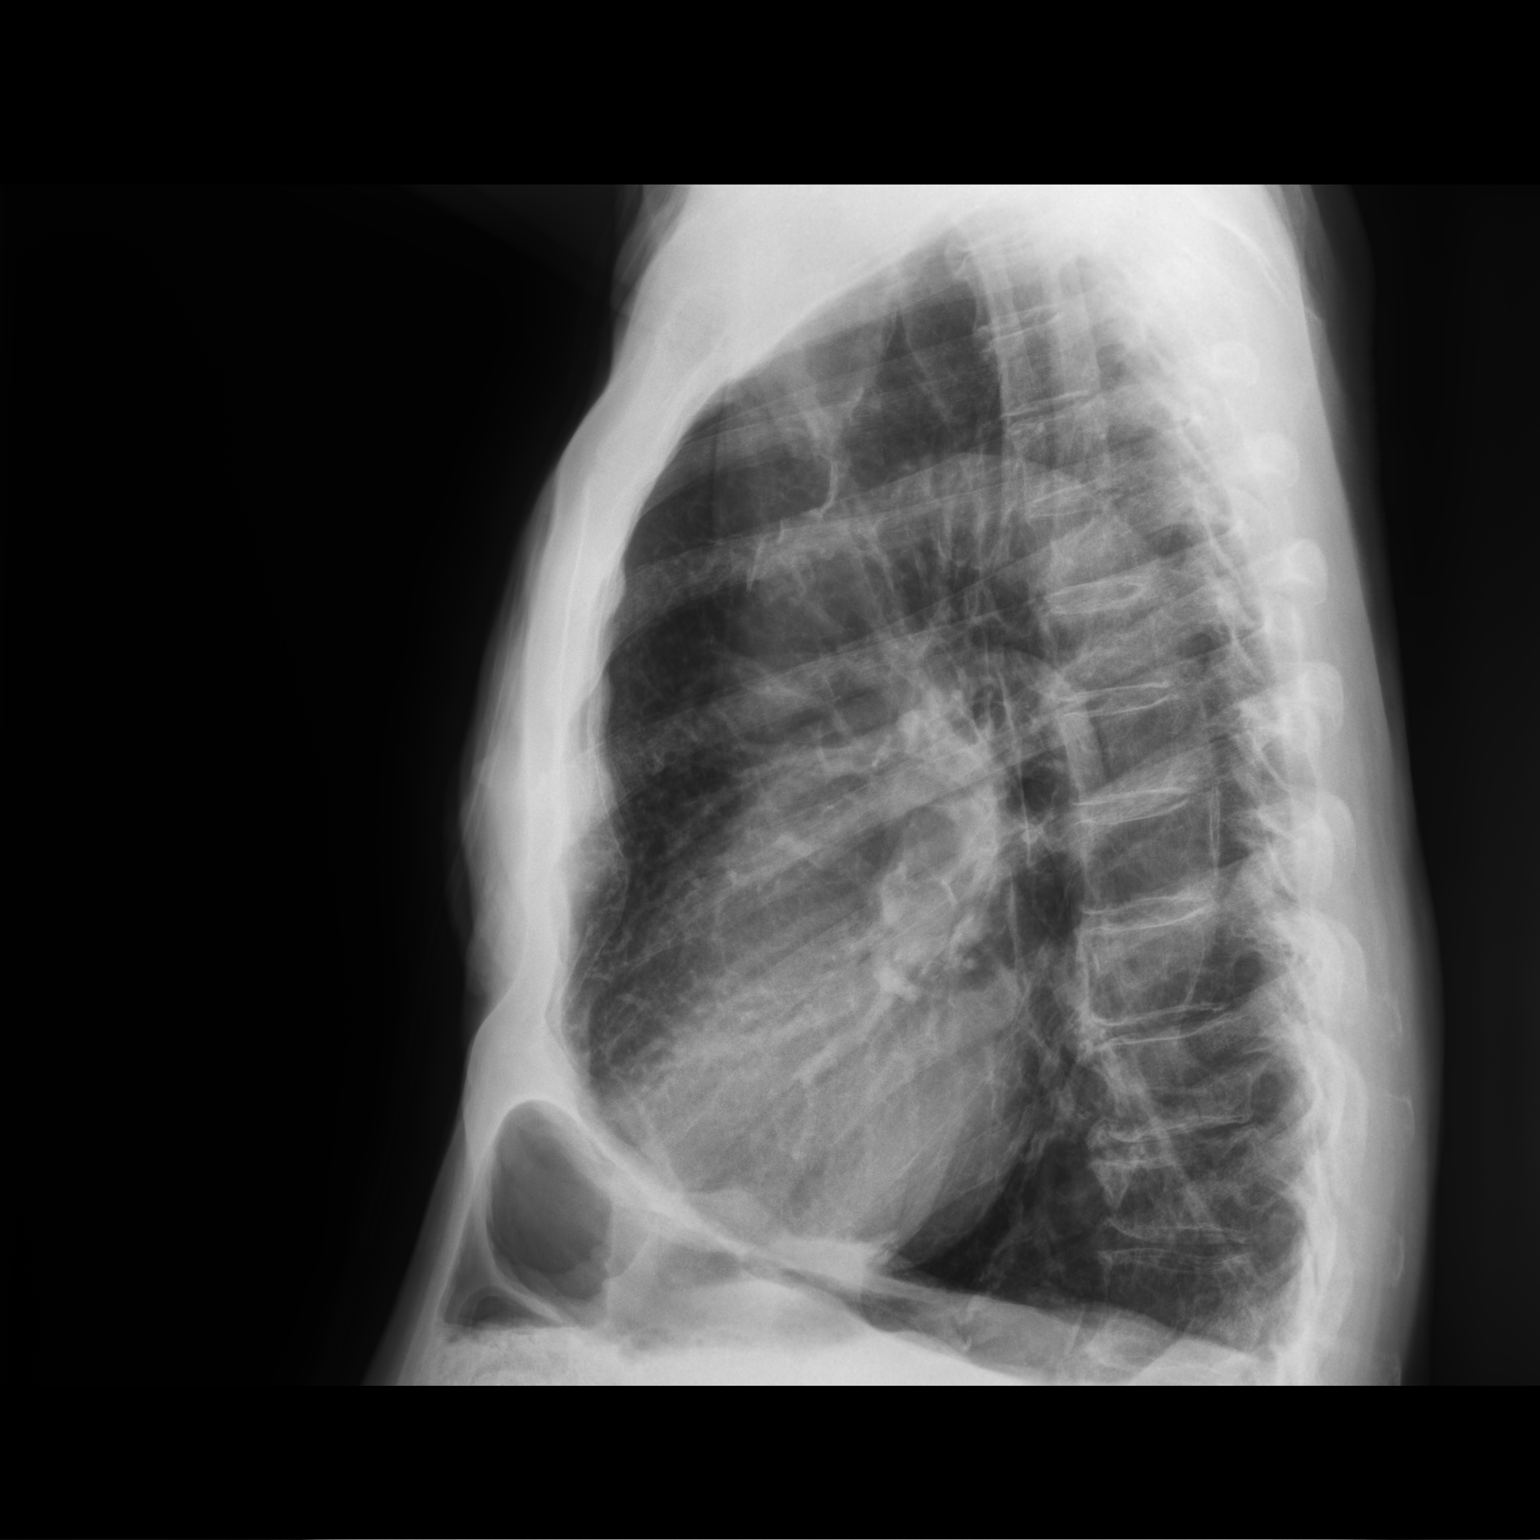

[2 of 2 positions shown; findings below may reference images not displayed]

FINDINGS: Cardiac shadow is within normal limits. Aortic calcifications are
again noted and stable. Lungs are hyperinflated consistent with
COPD. Previously seen lingular density is no longer identified. No
sizable infiltrate or effusion is seen. Degenerative changes of the
thoracic spine are noted.
IMPRESSION: COPD without acute abnormality.

## 2021-07-14 DIAGNOSIS — J9611 Chronic respiratory failure with hypoxia: Secondary | ICD-10-CM | POA: Diagnosis not present

## 2021-07-14 DIAGNOSIS — I872 Venous insufficiency (chronic) (peripheral): Secondary | ICD-10-CM | POA: Diagnosis not present

## 2021-07-14 DIAGNOSIS — J449 Chronic obstructive pulmonary disease, unspecified: Secondary | ICD-10-CM | POA: Diagnosis not present

## 2021-07-14 DIAGNOSIS — R0609 Other forms of dyspnea: Secondary | ICD-10-CM | POA: Diagnosis not present

## 2021-07-20 ENCOUNTER — Telehealth: Payer: Self-pay | Admitting: Pharmacy Technician

## 2021-07-20 ENCOUNTER — Other Ambulatory Visit (HOSPITAL_COMMUNITY): Payer: Self-pay

## 2021-07-20 NOTE — Telephone Encounter (Signed)
Patient Advocate Encounter   Received notification from Cover[employer]eds that prior authorization for [name] is due for renewal.   Key [id number] After submitting demographics, CMM gave this response:  [employer] is unable to retrieve the clinical questions that must be submitted to initiate the PA request. Please see more information at the bottom of the page for next steps.  Drug is covered by current benefit plan. No further PA activity needed

## 2021-07-20 NOTE — Progress Notes (Signed)
Patient ID: [name], male    DOB: 12-10-1934, 86 y.o.   MRN: [id number] PCP Dr Hulan Fess  HPI male former smoker followed for COPD/emphysema, pulmonary nodules, hypoxic respiratory failure, complicated by CAD N0NL 9/76/73-  MM PFT 04/27/11- Echocardiogram 07/31/2018-EF 55-60%, grade 1 DD, PHN 49 mmHg ------------------------------------------------------------------------------   02/17/21- 86 year old male former smoker followed for COPD/emphysema, pulmonary nodules, hypoxic respiratory failure, complicated by CAD, Anemia, Peripheral Edema,  O2 3 - 4L/[employer] Breztri, Ventolin hfa,  Covid vax-3 Phizer Body weight today-138 lbs ------6 mo f/u for COPD. States he has noticed that his SOB has increased since last visit. Also has noticed any increase in sneezing first thing in the mornings.  Sitting at home O2 sats in 90's on room air, but with minimal  exertion he needs up to 5L. No abrupt change. Little mucus or cough. Hasn't used nebulizer in a long time. Worth trying again. Diuretics help ankle edema but make him weak- discussed intravascular volume loss. Suggested he retry elastic hose.   07/21/21- 86 year old male former smoker followed for COPD/emphysema, pulmonary nodules, hypoxic respiratory failure, complicated by CAD, Anemia, Peripheral Edema,  O2 3 - 4L/[employer] -Breztri, Ventolin hfa, neb Duoneb,  Covid vax-5 Phizer Flu vax-had Body weight today-136 lbs -----SOB increased/ some cough and wheezing  Almost every time he comes, he says that shortness of breath is worse than ever.  This visit was no exception.  He had a brief interval 4 to 6 weeks ago when he was feeling well and began exercising.  He tried using his nebulizer machine and within a day or 2 was feeling worse with more shortness of breath and wheeze.  He cannot point to anything specific-no obvious infection or fluid retention and no acute event.  His primary physician gave an antibiotic and prednisone which  did not help.  She then repeated a prednisone taper which stopped part way through, about a week ago, when it was not helping. CXR 02/18/21- IMPRESSION: 1. COPD. No acute infiltrate. Bilateral pleural-parenchymal thickening with consistent scarring. 2. Heart size stable. Prominent central pulmonary arteries suggesting the possibility of pulmonary hypertension. No interim change.   Review of Systems-see HPI   + = positive Constitutional:   No-   weight loss, night sweats fevers, no-chills, fatigue, lassitude. HEENT:   No-  headaches, difficulty swallowing, tooth/dental problems, sore throat,       No-  sneezing, itching, ear ache, nasal congestion, post nasal drip,  CV:  No-chest pain, no-orthopnea, PND, +swelling in lower extremities, anasarca, dizziness, palpitations Resp: + shortness of breath with exertion, not at rest.               cough,  + non-productive cough,  No-  coughing up of blood.              No change color of mucus.  No- wheezing.   Skin: No-   rash or lesions. GI:  No-   heartburn, indigestion, abdominal pain, nausea,   GU: No-   dysuria,  MS:  No-   joint pain or swelling.  Neuro-  Psych:  No- change in mood or affect. No acute depression or anxiety.  No memory loss.  OBJ-    + looks frail    Wheelchair, O2 3L General- Alert, Oriented, Affect-appropriate, Distress- none acute. + O2 2, slender Skin-  Lymphadenopathy- none Head- atraumatic            Eyes- Gross vision intact, PERRLA, conjunctivae clear secretions  Ears- Hearing, canals-normal            Nose- Clear, no-Septal dev, mucus, polyps, erosion, perforation             Throat- Mallampati II , mucosa clear , drainage- none, tonsils- atrophic.  Neck- flexible , trachea midline, no stridor , thyroid nl, carotid no bruit Chest - symmetrical excursion , unlabored           Heart/CV- RRR , no murmur , no gallop  , no rub, nl s1 s2                           - JVD- none , edema 2-3+, stasis changes+,  varices- none           Lung-  Distant+, wheeze-none unlabored, cough-none, dullness-none, rub- none           Chest wall-  Abd-  Br/ Gen/ Rectal- Not done, not indicated Extrem- cyanosis- none, clubbing, none, atrophy- none, strength- nl.  Neuro- grossly intact to observation

## 2021-07-21 ENCOUNTER — Ambulatory Visit (INDEPENDENT_AMBULATORY_CARE_PROVIDER_SITE_OTHER): Payer: Medicare Other | Admitting: Internal Medicine

## 2021-07-21 ENCOUNTER — Ambulatory Visit (INDEPENDENT_AMBULATORY_CARE_PROVIDER_SITE_OTHER): Payer: Medicare Other

## 2021-07-21 ENCOUNTER — Other Ambulatory Visit: Payer: Self-pay

## 2021-07-21 ENCOUNTER — Encounter: Payer: Self-pay | Admitting: Internal Medicine

## 2021-07-21 VITALS — BP 124/60 | HR 84 | Temp 98.3°F | Ht 69.0 in | Wt 136.4 lb

## 2021-07-21 DIAGNOSIS — J449 Chronic obstructive pulmonary disease, unspecified: Secondary | ICD-10-CM | POA: Diagnosis not present

## 2021-07-21 DIAGNOSIS — J441 Chronic obstructive pulmonary disease with (acute) exacerbation: Secondary | ICD-10-CM | POA: Diagnosis not present

## 2021-07-21 DIAGNOSIS — I25119 Atherosclerotic heart disease of native coronary artery with unspecified angina pectoris: Secondary | ICD-10-CM

## 2021-07-21 DIAGNOSIS — D649 Anemia, unspecified: Secondary | ICD-10-CM

## 2021-07-21 DIAGNOSIS — R918 Other nonspecific abnormal finding of lung field: Secondary | ICD-10-CM | POA: Diagnosis not present

## 2021-07-21 DIAGNOSIS — J189 Pneumonia, unspecified organism: Secondary | ICD-10-CM

## 2021-07-21 MED ORDER — LEVALBUTEROL TARTRATE 45 MCG/ACT IN AERO: INHALATION_SPRAY | RESPIRATORY_TRACT | 12 refills | Status: AC

## 2021-07-21 MED ORDER — STIOLTO RESPIMAT 2.5-2.5 MCG/ACT IN AERS: 2.0000 | INHALATION_SPRAY | Freq: Every day | RESPIRATORY_TRACT | 0 refills | Status: DC

## 2021-07-21 NOTE — Patient Instructions (Signed)
Order- CXR  dx COPD exacerbation  Order- sample x 2 Stiolto 2.5       inhale 2 puffs daily     try this instead of Breztri  Script sent to [facility] for levalbuterol (Xopenex)   Try this instead of albuterol/ Ventolin/ ProAir as your rescue inhaler for occasional use if needed

## 2021-07-21 NOTE — Assessment & Plan Note (Signed)
I cannot point to an obvious explanation for his complaint of worse dyspnea unless weather changes are important. Plan-CXR, try Stiolto for different device instead of Breztri at his request

## 2021-07-21 NOTE — Assessment & Plan Note (Signed)
If dyspnea persists we will need to recheck hemoglobin.  He denies obvious blood loss.

## 2021-07-22 ENCOUNTER — Other Ambulatory Visit: Payer: Self-pay

## 2021-07-22 ENCOUNTER — Encounter: Payer: Self-pay | Admitting: Internal Medicine

## 2021-07-22 MED ORDER — LEVOFLOXACIN 750 MG PO TABS: 750.0000 mg | ORAL_TABLET | Freq: Every day | ORAL | 0 refills | Status: DC

## 2021-07-22 NOTE — Telephone Encounter (Signed)
Received the following message from patient:   "Just read x-ray results showing  acute infiltrate.  Should I get on another antibiotic?  Also, do I need a second inhaler like symbicort to go with Stiolto?"  Dr. Annamaria Boots, can you please advise? Thanks!

## 2021-07-22 NOTE — Telephone Encounter (Signed)
I agree- CXR does show pneumonia in right upper lung.  Please sent levaquin 750 mg, # 7, 1 daily

## 2021-07-23 ENCOUNTER — Telehealth: Payer: Self-pay | Admitting: Internal Medicine

## 2021-07-23 NOTE — Telephone Encounter (Signed)
Deneise Lever, MD  07/22/2021  3:56 PM EST     I have asked we send levaquin 750 mg, # 7, 1 daily. Please order f/u outpatient CXR in 1 month-  dx Community acquired pneumonia    Called and spoke with pt letting him know the results of the cxr and that we did send Rx for Levaquin to the pharmacy for him and he verbalized understanding. Stated to him that we would repeat the cxr in 1 month and also stated to him if not any better after finishing the abx to call us and we would further discuss with CY. Nothing further needed.

## 2021-07-27 ENCOUNTER — Encounter (HOSPITAL_BASED_OUTPATIENT_CLINIC_OR_DEPARTMENT_OTHER): Payer: Self-pay | Admitting: Emergency Medicine

## 2021-07-27 ENCOUNTER — Telehealth: Payer: Self-pay | Admitting: Internal Medicine

## 2021-07-27 ENCOUNTER — Other Ambulatory Visit: Payer: Self-pay

## 2021-07-27 ENCOUNTER — Inpatient Hospital Stay (HOSPITAL_BASED_OUTPATIENT_CLINIC_OR_DEPARTMENT_OTHER)
Admission: EM | Admit: 2021-07-27 | Discharge: 2021-07-30 | DRG: 193 | Disposition: A | Discharge status: Home Health | Payer: Medicare Other | Attending: Internal Medicine | Admitting: Internal Medicine

## 2021-07-27 ENCOUNTER — Emergency Department (HOSPITAL_BASED_OUTPATIENT_CLINIC_OR_DEPARTMENT_OTHER): Payer: Medicare Other

## 2021-07-27 DIAGNOSIS — J301 Allergic rhinitis due to pollen: Secondary | ICD-10-CM | POA: Diagnosis not present

## 2021-07-27 DIAGNOSIS — I872 Venous insufficiency (chronic) (peripheral): Secondary | ICD-10-CM | POA: Diagnosis present

## 2021-07-27 DIAGNOSIS — E785 Hyperlipidemia, unspecified: Secondary | ICD-10-CM | POA: Diagnosis present

## 2021-07-27 DIAGNOSIS — J441 Chronic obstructive pulmonary disease with (acute) exacerbation: Secondary | ICD-10-CM | POA: Diagnosis present

## 2021-07-27 DIAGNOSIS — J9621 Acute and chronic respiratory failure with hypoxia: Secondary | ICD-10-CM | POA: Diagnosis not present

## 2021-07-27 DIAGNOSIS — I2510 Atherosclerotic heart disease of native coronary artery without angina pectoris: Secondary | ICD-10-CM | POA: Diagnosis present

## 2021-07-27 DIAGNOSIS — R351 Nocturia: Secondary | ICD-10-CM | POA: Diagnosis present

## 2021-07-27 DIAGNOSIS — J309 Allergic rhinitis, unspecified: Secondary | ICD-10-CM | POA: Diagnosis present

## 2021-07-27 DIAGNOSIS — R531 Weakness: Secondary | ICD-10-CM | POA: Diagnosis not present

## 2021-07-27 DIAGNOSIS — N401 Enlarged prostate with lower urinary tract symptoms: Secondary | ICD-10-CM | POA: Diagnosis present

## 2021-07-27 DIAGNOSIS — Z9852 Vasectomy status: Secondary | ICD-10-CM

## 2021-07-27 DIAGNOSIS — J449 Chronic obstructive pulmonary disease, unspecified: Secondary | ICD-10-CM | POA: Diagnosis present

## 2021-07-27 DIAGNOSIS — I2699 Other pulmonary embolism without acute cor pulmonale: Secondary | ICD-10-CM | POA: Diagnosis not present

## 2021-07-27 DIAGNOSIS — Z9049 Acquired absence of other specified parts of digestive tract: Secondary | ICD-10-CM

## 2021-07-27 DIAGNOSIS — Z9981 Dependence on supplemental oxygen: Secondary | ICD-10-CM

## 2021-07-27 DIAGNOSIS — Z8249 Family history of ischemic heart disease and other diseases of the circulatory system: Secondary | ICD-10-CM | POA: Diagnosis not present

## 2021-07-27 DIAGNOSIS — Z66 Do not resuscitate: Secondary | ICD-10-CM | POA: Diagnosis present

## 2021-07-27 DIAGNOSIS — J439 Emphysema, unspecified: Secondary | ICD-10-CM | POA: Diagnosis present

## 2021-07-27 DIAGNOSIS — K449 Diaphragmatic hernia without obstruction or gangrene: Secondary | ICD-10-CM | POA: Diagnosis not present

## 2021-07-27 DIAGNOSIS — Z881 Allergy status to other antibiotic agents status: Secondary | ICD-10-CM

## 2021-07-27 DIAGNOSIS — J069 Acute upper respiratory infection, unspecified: Secondary | ICD-10-CM | POA: Diagnosis present

## 2021-07-27 DIAGNOSIS — Z87891 Personal history of nicotine dependence: Secondary | ICD-10-CM | POA: Diagnosis not present

## 2021-07-27 DIAGNOSIS — E7800 Pure hypercholesterolemia, unspecified: Secondary | ICD-10-CM

## 2021-07-27 DIAGNOSIS — Z79899 Other long term (current) drug therapy: Secondary | ICD-10-CM | POA: Diagnosis not present

## 2021-07-27 DIAGNOSIS — R000 Tachycardia, unspecified: Secondary | ICD-10-CM | POA: Diagnosis not present

## 2021-07-27 DIAGNOSIS — Z888 Allergy status to other drugs, medicaments and biological substances status: Secondary | ICD-10-CM | POA: Diagnosis not present

## 2021-07-27 DIAGNOSIS — R059 Cough, unspecified: Secondary | ICD-10-CM | POA: Diagnosis not present

## 2021-07-27 DIAGNOSIS — R0600 Dyspnea, unspecified: Secondary | ICD-10-CM | POA: Diagnosis not present

## 2021-07-27 DIAGNOSIS — Z886 Allergy status to analgesic agent status: Secondary | ICD-10-CM

## 2021-07-27 DIAGNOSIS — Z20822 Contact with and (suspected) exposure to covid-19: Secondary | ICD-10-CM | POA: Diagnosis present

## 2021-07-27 DIAGNOSIS — I10 Essential (primary) hypertension: Secondary | ICD-10-CM | POA: Diagnosis present

## 2021-07-27 DIAGNOSIS — J189 Pneumonia, unspecified organism: Principal | ICD-10-CM | POA: Diagnosis present

## 2021-07-27 DIAGNOSIS — R0902 Hypoxemia: Secondary | ICD-10-CM | POA: Diagnosis not present

## 2021-07-27 LAB — CBC WITH DIFFERENTIAL/PLATELET
Abs Immature Granulocytes: 0.12 10*3/uL — ABNORMAL HIGH (ref 0.00–0.07)
Basophils Absolute: 0 10*3/uL (ref 0.0–0.1)
Basophils Relative: 0 %
Eosinophils Absolute: 0.2 10*3/uL (ref 0.0–0.5)
Eosinophils Relative: 1 %
HCT: 37.7 % — ABNORMAL LOW (ref 39.0–52.0)
Hemoglobin: 11.9 g/dL — ABNORMAL LOW (ref 13.0–17.0)
Immature Granulocytes: 1 %
Lymphocytes Relative: 4 %
Lymphs Abs: 0.5 10*3/uL — ABNORMAL LOW (ref 0.7–4.0)
MCH: 27.9 pg (ref 26.0–34.0)
MCHC: 31.6 g/dL (ref 30.0–36.0)
MCV: 88.3 fL (ref 80.0–100.0)
Monocytes Absolute: 0.7 10*3/uL (ref 0.1–1.0)
Monocytes Relative: 6 %
Neutro Abs: 11.8 10*3/uL — ABNORMAL HIGH (ref 1.7–7.7)
Neutrophils Relative %: 88 %
Platelets: 354 10*3/uL (ref 150–400)
RBC: 4.27 MIL/uL (ref 4.22–5.81)
RDW: 13.4 % (ref 11.5–15.5)
WBC: 13.3 10*3/uL — ABNORMAL HIGH (ref 4.0–10.5)
nRBC: 0 % (ref 0.0–0.2)

## 2021-07-27 LAB — URINALYSIS, ROUTINE W REFLEX MICROSCOPIC
Bacteria, UA: NONE SEEN
Bilirubin Urine: NEGATIVE
Glucose, UA: NEGATIVE mg/dL
Hgb urine dipstick: NEGATIVE
Ketones, ur: 20 mg/dL — AB
Leukocytes,Ua: NEGATIVE
Nitrite: NEGATIVE
Protein, ur: 30 mg/dL — AB
Specific Gravity, Urine: 1.025 (ref 1.005–1.030)
pH: 6 (ref 5.0–8.0)

## 2021-07-27 LAB — BASIC METABOLIC PANEL
Anion gap: 6 (ref 5–15)
BUN: 14 mg/dL (ref 8–23)
CO2: 34 mmol/L — ABNORMAL HIGH (ref 22–32)
Calcium: 9.1 mg/dL (ref 8.9–10.3)
Chloride: 95 mmol/L — ABNORMAL LOW (ref 98–111)
Creatinine, Ser: 0.44 mg/dL — ABNORMAL LOW (ref 0.61–1.24)
GFR, Estimated: >60 mL/min (ref >60)
Glucose, Bld: 139 mg/dL — ABNORMAL HIGH (ref 70–99)
Potassium: 4.5 mmol/L (ref 3.5–5.1)
Sodium: 135 mmol/L (ref 135–145)

## 2021-07-27 LAB — PROTIME-INR
INR: 1.1 (ref 0.8–1.2)
Prothrombin Time: 13.7 seconds (ref 11.4–15.2)

## 2021-07-27 LAB — RESP PANEL BY RT-PCR (FLU A&B, COVID) ARPGX2
Influenza A by PCR: NEGATIVE
Influenza B by PCR: NEGATIVE
SARS Coronavirus 2 by RT PCR: NEGATIVE

## 2021-07-27 LAB — MRSA NEXT GEN BY PCR, NASAL: MRSA by PCR Next Gen: NOT DETECTED

## 2021-07-27 LAB — TROPONIN I (HIGH SENSITIVITY)
Troponin I (High Sensitivity): 7 ng/L (ref <18)
Troponin I (High Sensitivity): 8 ng/L (ref <18)

## 2021-07-27 LAB — LACTIC ACID, PLASMA: Lactic Acid, Venous: 1.4 mmol/L (ref 0.5–1.9)

## 2021-07-27 LAB — APTT: aPTT: 29 seconds (ref 24–36)

## 2021-07-27 LAB — BRAIN NATRIURETIC PEPTIDE: B Natriuretic Peptide: 215.3 pg/mL — ABNORMAL HIGH (ref 0.0–100.0)

## 2021-07-27 LAB — HEPARIN LEVEL (UNFRACTIONATED): Heparin Unfractionated: 0.2 IU/mL — ABNORMAL LOW (ref 0.30–0.70)

## 2021-07-27 MED ORDER — IPRATROPIUM BROMIDE 0.02 % IN SOLN
0.5000 mg | Freq: Three times a day (TID) | RESPIRATORY_TRACT | Status: DC
  Administered 2021-07-28: 0.5 mg via RESPIRATORY_TRACT
  Filled 2021-07-27: qty 2.5

## 2021-07-27 MED ORDER — VANCOMYCIN HCL 1250 MG/250ML IV SOLN
1250.0000 mg | INTRAVENOUS | Status: DC
  Administered 2021-07-27: 1250 mg via INTRAVENOUS
  Filled 2021-07-27: qty 250

## 2021-07-27 MED ORDER — ACETAMINOPHEN 325 MG PO TABS: 650.0000 mg | ORAL_TABLET | Freq: Four times a day (QID) | ORAL | Status: DC | PRN

## 2021-07-27 MED ORDER — MORPHINE SULFATE (PF) 2 MG/ML IV SOLN: 1.0000 mg | INTRAVENOUS | Status: DC | PRN

## 2021-07-27 MED ORDER — LEVALBUTEROL HCL 0.63 MG/3ML IN NEBU: 0.6300 mg | INHALATION_SOLUTION | RESPIRATORY_TRACT | Status: DC | PRN

## 2021-07-27 MED ORDER — LEVALBUTEROL HCL 0.63 MG/3ML IN NEBU
0.6300 mg | INHALATION_SOLUTION | Freq: Four times a day (QID) | RESPIRATORY_TRACT | Status: DC
  Administered 2021-07-27: 0.63 mg via RESPIRATORY_TRACT
  Filled 2021-07-27: qty 3

## 2021-07-27 MED ORDER — PANTOPRAZOLE SODIUM 40 MG PO TBEC
40.0000 mg | DELAYED_RELEASE_TABLET | Freq: Every day | ORAL | Status: DC
  Administered 2021-07-28 – 2021-07-30 (×3): 40 mg via ORAL
  Filled 2021-07-27 (×3): qty 1

## 2021-07-27 MED ORDER — HEPARIN BOLUS VIA INFUSION
4000.0000 [IU] | Freq: Once | INTRAVENOUS | Status: AC
  Administered 2021-07-27: 4000 [IU] via INTRAVENOUS

## 2021-07-27 MED ORDER — VITAMIN B-12 1000 MCG PO TABS
1000.0000 ug | ORAL_TABLET | Freq: Every day | ORAL | Status: DC
  Administered 2021-07-28 – 2021-07-30 (×3): 1000 ug via ORAL
  Filled 2021-07-27 (×3): qty 1

## 2021-07-27 MED ORDER — POLYETHYLENE GLYCOL 3350 17 G PO PACK
17.0000 g | PACK | Freq: Every day | ORAL | Status: DC
  Filled 2021-07-27 (×3): qty 1

## 2021-07-27 MED ORDER — DULOXETINE HCL 20 MG PO CPEP
20.0000 mg | ORAL_CAPSULE | Freq: Every day | ORAL | Status: DC
  Filled 2021-07-27: qty 1

## 2021-07-27 MED ORDER — HYDRALAZINE HCL 20 MG/ML IJ SOLN: 5.0000 mg | Freq: Four times a day (QID) | INTRAMUSCULAR | Status: DC | PRN

## 2021-07-27 MED ORDER — SODIUM CHLORIDE 0.9 % IV SOLN
2.0000 g | Freq: Three times a day (TID) | INTRAVENOUS | Status: DC
  Administered 2021-07-27 – 2021-07-30 (×9): 2 g via INTRAVENOUS
  Filled 2021-07-27 (×10): qty 2

## 2021-07-27 MED ORDER — SODIUM CHLORIDE 0.9% FLUSH
3.0000 mL | Freq: Two times a day (BID) | INTRAVENOUS | Status: DC
  Administered 2021-07-28 – 2021-07-30 (×5): 3 mL via INTRAVENOUS

## 2021-07-27 MED ORDER — SODIUM CHLORIDE 0.9 % IV SOLN: INTRAVENOUS | Status: DC | PRN

## 2021-07-27 MED ORDER — SODIUM CHLORIDE 0.9 % IV SOLN
500.0000 mg | INTRAVENOUS | Status: DC
  Administered 2021-07-28 – 2021-07-29 (×2): 500 mg via INTRAVENOUS
  Filled 2021-07-27 (×3): qty 5

## 2021-07-27 MED ORDER — GUAIFENESIN ER 600 MG PO TB12
1200.0000 mg | ORAL_TABLET | Freq: Two times a day (BID) | ORAL | Status: DC
  Administered 2021-07-28 – 2021-07-30 (×6): 1200 mg via ORAL
  Filled 2021-07-27 (×6): qty 2

## 2021-07-27 MED ORDER — IPRATROPIUM BROMIDE 0.02 % IN SOLN: 0.5000 mg | RESPIRATORY_TRACT | Status: DC | PRN

## 2021-07-27 MED ORDER — IOHEXOL 350 MG/ML SOLN
75.0000 mL | Freq: Once | INTRAVENOUS | Status: AC | PRN
  Administered 2021-07-27: 75 mL via INTRAVENOUS

## 2021-07-27 MED ORDER — TAMSULOSIN HCL 0.4 MG PO CAPS
0.4000 mg | ORAL_CAPSULE | Freq: Every day | ORAL | Status: DC
  Administered 2021-07-28 – 2021-07-30 (×4): 0.4 mg via ORAL
  Filled 2021-07-27 (×4): qty 1

## 2021-07-27 MED ORDER — LOSARTAN POTASSIUM 25 MG PO TABS: 25.0000 mg | ORAL_TABLET | Freq: Every day | ORAL | Status: DC

## 2021-07-27 MED ORDER — FLUTICASONE PROPIONATE 50 MCG/ACT NA SUSP
2.0000 | Freq: Every day | NASAL | Status: DC
  Administered 2021-07-28 – 2021-07-30 (×4): 2 via NASAL
  Filled 2021-07-27: qty 16

## 2021-07-27 MED ORDER — PREMIER PROTEIN SHAKE: 2.0000 [oz_av] | Freq: Every day | ORAL | Status: DC

## 2021-07-27 MED ORDER — METHYLPREDNISOLONE SODIUM SUCC 125 MG IJ SOLR
125.0000 mg | Freq: Once | INTRAMUSCULAR | Status: AC
  Administered 2021-07-27: 125 mg via INTRAVENOUS
  Filled 2021-07-27: qty 2

## 2021-07-27 MED ORDER — HEPARIN (PORCINE) 25000 UT/250ML-% IV SOLN
1200.0000 [IU]/h | INTRAVENOUS | Status: DC
  Administered 2021-07-27: 1100 [IU]/h via INTRAVENOUS
  Administered 2021-07-28 – 2021-07-29 (×2): 1300 [IU]/h via INTRAVENOUS
  Filled 2021-07-27 (×4): qty 250

## 2021-07-27 MED ORDER — LEVALBUTEROL HCL 0.63 MG/3ML IN NEBU
0.6300 mg | INHALATION_SOLUTION | Freq: Three times a day (TID) | RESPIRATORY_TRACT | Status: DC
  Administered 2021-07-28: 0.63 mg via RESPIRATORY_TRACT
  Filled 2021-07-27: qty 3

## 2021-07-27 MED ORDER — LOSARTAN POTASSIUM 25 MG PO TABS
25.0000 mg | ORAL_TABLET | Freq: Every day | ORAL | Status: DC
  Administered 2021-07-28 – 2021-07-30 (×3): 25 mg via ORAL
  Filled 2021-07-27 (×3): qty 1

## 2021-07-27 MED ORDER — BUDESONIDE 0.5 MG/2ML IN SUSP
0.5000 mg | Freq: Two times a day (BID) | RESPIRATORY_TRACT | Status: DC
  Administered 2021-07-27 – 2021-07-30 (×6): 0.5 mg via RESPIRATORY_TRACT
  Filled 2021-07-27 (×6): qty 2

## 2021-07-27 MED ORDER — ACETAMINOPHEN 650 MG RE SUPP: 650.0000 mg | Freq: Four times a day (QID) | RECTAL | Status: DC | PRN

## 2021-07-27 MED ORDER — SORBITOL 70 % SOLN: 30.0000 mL | Freq: Every day | Status: DC | PRN

## 2021-07-27 MED ORDER — IPRATROPIUM BROMIDE 0.02 % IN SOLN
0.5000 mg | Freq: Four times a day (QID) | RESPIRATORY_TRACT | Status: DC
  Administered 2021-07-27: 0.5 mg via RESPIRATORY_TRACT
  Filled 2021-07-27: qty 2.5

## 2021-07-27 MED ORDER — ONDANSETRON HCL 4 MG PO TABS: 4.0000 mg | ORAL_TABLET | Freq: Four times a day (QID) | ORAL | Status: DC | PRN

## 2021-07-27 MED ORDER — SODIUM CHLORIDE 0.9 % IV SOLN
500.0000 mg | Freq: Once | INTRAVENOUS | Status: AC
  Administered 2021-07-27: 500 mg via INTRAVENOUS
  Filled 2021-07-27: qty 5

## 2021-07-27 MED ORDER — IPRATROPIUM-ALBUTEROL 0.5-2.5 (3) MG/3ML IN SOLN
3.0000 mL | Freq: Once | RESPIRATORY_TRACT | Status: AC
  Administered 2021-07-27: 3 mL via RESPIRATORY_TRACT
  Filled 2021-07-27: qty 3

## 2021-07-27 MED ORDER — ENSURE MAX PROTEIN PO LIQD
2.0000 [oz_av] | Freq: Every day | ORAL | Status: DC
  Administered 2021-07-29: 10:00:00 2 [oz_av] via ORAL
  Filled 2021-07-27 (×3): qty 330

## 2021-07-27 MED ORDER — LORATADINE 10 MG PO TABS
10.0000 mg | ORAL_TABLET | Freq: Every day | ORAL | Status: DC
  Administered 2021-07-28 – 2021-07-30 (×4): 10 mg via ORAL
  Filled 2021-07-27 (×4): qty 1

## 2021-07-27 MED ORDER — POLYETHYLENE GLYCOL 3350 17 G PO PACK: 17.0000 g | PACK | Freq: Every day | ORAL | Status: DC | PRN

## 2021-07-27 MED ORDER — FLEET ENEMA 7-19 GM/118ML RE ENEM: 1.0000 | ENEMA | Freq: Once | RECTAL | Status: DC | PRN

## 2021-07-27 MED ORDER — SENNA 8.6 MG PO TABS
1.0000 | ORAL_TABLET | Freq: Two times a day (BID) | ORAL | Status: DC
  Administered 2021-07-28 – 2021-07-30 (×6): 8.6 mg via ORAL
  Filled 2021-07-27 (×6): qty 1

## 2021-07-27 MED ORDER — SODIUM CHLORIDE 0.9 % IV SOLN: INTRAVENOUS | Status: AC

## 2021-07-27 MED ORDER — ONDANSETRON HCL 4 MG/2ML IJ SOLN: 4.0000 mg | Freq: Four times a day (QID) | INTRAMUSCULAR | Status: DC | PRN

## 2021-07-27 MED ORDER — PRAVASTATIN SODIUM 40 MG PO TABS
40.0000 mg | ORAL_TABLET | Freq: Every day | ORAL | Status: DC
  Administered 2021-07-28 – 2021-07-30 (×4): 40 mg via ORAL
  Filled 2021-07-27 (×4): qty 1

## 2021-07-27 MED ORDER — SODIUM CHLORIDE 0.9 % IV SOLN
2.0000 g | Freq: Once | INTRAVENOUS | Status: AC
  Administered 2021-07-27: 2 g via INTRAVENOUS
  Filled 2021-07-27: qty 20

## 2021-07-27 NOTE — Telephone Encounter (Signed)
I think he will be better off going to ED

## 2021-07-27 NOTE — Telephone Encounter (Signed)
Call returned to patient, made aware of CY recommendations. Voiced recommendations. Patient wanted to know how he would get there, I made him aware if he has someone that can drive him, they can take him or call 911. I offered to call 911 from the office however patient declined stating he would call.   Nothing further needed at this time.

## 2021-07-27 NOTE — ED Notes (Signed)
[facility] RT made aware of patient transfer to 4W and report given.

## 2021-07-27 NOTE — Progress Notes (Signed)
Pharmacy Antibiotic Note  GULED GAHAN is a 86 y.o. male admitted on 07/27/2021 with pneumonia.  Pharmacy has been consulted for vanc/cefepime dosing.  Plan: Vanc 1250mg  IV q24 - goal AUC 400-550 Cefepime 2g IV q8 Check MRSA PCR     Temp (24hrs), Avg:98.2 F (36.8 C), Min:97.4 F (36.3 C), Max:98.9 F (37.2 C)  Recent Labs  Lab 07/27/21 1216 07/27/21 1400  WBC 13.3*  --   CREATININE 0.44*  --   LATICACIDVEN  --  1.4    Estimated Creatinine Clearance: 60.2 mL/min (A) (by C-G formula based on SCr of 0.44 mg/dL (L)).    Allergies  Allergen Reactions   Tolectin [Tolmetin Sodium] Other (See Comments)    BP low and passed out   Montelukast Sodium Other (See Comments)    Unknown reaction   Nsaids Other (See Comments)    MD told pt not to use   Augmentin [Amoxicillin-Pot Clavulanate] Nausea And Vomiting    Did it involve swelling of the face/tongue/throat, SOB, or low BP? No Did it involve sudden or severe rash/hives, skin peeling, or any reaction on the inside of your mouth or nose? No Did you need to seek medical attention at a hospital or doctor's office? No When did it last happen? Last year   If all above answers are "NO", may proceed with cephalosporin use.   Neosporin [Bacitracin-Polymyxin B] Rash     Thank you for allowing pharmacy to be a part of this patient's care.  Kara Mead 07/27/2021 5:51 PM

## 2021-07-27 NOTE — ED Triage Notes (Addendum)
Pt brought from home via ems , reporting dx with pneumonia  5 days ago and has not been getting/feeling any better  he states , pt is on home o2 3-4 liters  [place]

## 2021-07-27 NOTE — Telephone Encounter (Signed)
Call returned to patient, confirmed DOB. Patient states he was given levaquin last week and he is still not feeling any better. He has not completed the course, he states this will be his 5th dose of the levaquin and he actually feels worse. He states he is very SOB with minimal effort. Reports productive cough with thick yellow mucous. He is using 6L of O2. He normally uses 3-4. If he exerts he reports his oxygen drops below 88% if he does not turn up his oxygen to 6L. He is using [employer], academic daily. Has not tried the stiolto. He does not feel the nebulizer machine works at all for him. Patient wants to know if he needs to go to the hospital or if he can come get a CXR or if he just needs to come in and be seen. Denies fever, chills, sweats, or body aches.   Allergies: Tolectin, Montelukast, Nsaids, Neosporin, Augmentin.   CY please advise. Thanks

## 2021-07-27 NOTE — ED Provider Notes (Addendum)
[place] EMERGENCY DEPT Provider Note   CSN: [id number] Arrival date & time: 07/27/21  1154     History Chief Complaint  Patient presents with   Cough    [name] is a 86 y.o. male.  The history is provided by the patient.  Cough Cough characteristics:  Non-productive Sputum characteristics:  Nondescript Severity:  Mild Onset quality:  Gradual Duration:  2 weeks Timing:  Constant Progression:  Worsening Context: upper respiratory infection   Relieved by:  Nothing Associated symptoms: shortness of breath   Associated symptoms: no chest pain, no chills, no ear pain, no fever, no rash and no sore throat       Past Medical History:  Diagnosis Date   Allergic rhinitis, cause unspecified    Arthritis    COPD (chronic obstructive pulmonary disease) (HCC)    Dyspnea    Dysrhythmia    "1 episode of tachycardia in 1980's, been on it ever since"   Emphysema of lung ([place])    Hypertension    Left inguinal hernia 02/07/2019   Low hemoglobin    low level   Neuromuscular disorder (HCC)    Neuropathy    feet   Pneumonia    History of, last Oct 2019   Pulmonary nodule     Patient Active Problem List   Diagnosis Date Noted   Benign prostatic hyperplasia with nocturia 09/20/2019   Closed fracture of first lumbar vertebra ([place]) 09/20/2019   Idiopathic progressive neuropathy 09/20/2019   Other fatigue 09/20/2019   Venous insufficiency of both lower extremities 09/20/2019   Anemia 07/25/2019   Left inguinal hernia 02/07/2019   Left leg pain 05/18/2018   Pneumonia 05/18/2018   Chronic respiratory failure with hypoxia ([place]) 01/12/2015   COPD with acute exacerbation ([place]) 01/21/2013   Hyperlipidemia 12/01/2011   Hypertension 12/01/2011   CAD (coronary artery disease) 05/23/2011   Lung nodule 09/16/2008   ALLERGIC RHINITIS 07/24/2007   COPD mixed type ([place]) 07/24/2007   BURSITIS 07/24/2007    Past Surgical History:  Procedure Laterality Date    [place]  2003   performed at [facility], Dr. Fransico Him   CATARACT EXTRACTION  2008   Dr. Darleen Crocker   CATARACT EXTRACTION, BILATERAL     EYE SURGERY     INGUINAL HERNIA REPAIR Left 02/07/2019   Procedure: OPEN REPAIR LEFT INGUINAL HERNIA WITH MESH;  Surgeon: Fanny Skates, MD;  Location: [place];  Service: General;  Laterality: Left;  SPINAL AND TAP BLOCK ANESTHESIA   ROTATOR CUFF REPAIR  2002   Dr. Kathryne Hitch   TONSILLECTOMY  1945   VASECTOMY         Family History  Problem Relation Age of Onset   COPD Mother    Stroke Mother    Stroke Father    Coronary artery disease Other        [facility]    Social History   Tobacco Use   Smoking status: Former    Packs/day: 2.00    Years: 38.00    Pack years: 76.00    Types: Cigarettes    Quit date: 08/16/1990    Years since quitting: 30.9   Smokeless tobacco: Never  Vaping Use   Vaping Use: Never used  Substance Use Topics   Alcohol use: Yes    Alcohol/week: 7.0 standard drinks    Types: 7 Shots of liquor per week    Comment: reports having a cocktail nightly with wife  Drug use: No    Home Medications Prior to Admission medications   Medication Sig Start Date End Date Taking? Authorizing Provider  acetaminophen (TYLENOL) 650 MG CR tablet Take 650 mg by mouth daily. 2 tablets in the morning    [provider]  BREZTRI AEROSPHERE 160-9-4.8 MCG/ACT AERO INHALE 2 PUFFS INTO THE LUNGS IN THE MORNING AND AT BEDTIME 09/12/20   Young, Tarri Fuller D, MD  DULoxetine (CYMBALTA) 20 MG capsule Take 1 capsule (20 mg total) by mouth daily. 09/25/20   Reed, Tiffany L, DO  ipratropium-albuterol (DUONEB) 0.5-2.5 (3) MG/3ML SOLN Take 3 mLs by nebulization every 6 (six) hours as needed (for SOB AND wheezing every 6 hours AND PRN). 02/23/21   Deneise Lever, MD  ketoconazole (NIZORAL) 2 % shampoo SMARTSIG:5 Milliliter(s) Topical Daily 05/01/20   [provider]  levalbuterol Penne Lash HFA) 45  MCG/ACT inhaler Inhale 2 puffs every 6 hours as needed- rescue 07/21/21   Baird Lyons D, MD  levofloxacin (LEVAQUIN) 750 MG tablet Take 1 tablet (750 mg total) by mouth daily. 07/22/21   Deneise Lever, MD  losartan (COZAAR) 25 MG tablet Take 1 tablet (25 mg total) by mouth daily. Please keep upcoming appt in April 2022 with Dr. Burt Knack before anymore refills. Thank you 08/25/20   Reed, Tiffany L, DO  OXYGEN Inhale 3-6 L into the lungs continuous.     [provider]  polyethylene glycol powder (GLYCOLAX/MIRALAX) powder Take 17 g by mouth daily.    [provider]  pravastatin (PRAVACHOL) 40 MG tablet TAKE 1 TABLET(40 MG) BY MOUTH DAILY 10/23/20   Reed, Tiffany L, DO  protein supplement shake (PREMIER PROTEIN) LIQD Take 2 oz by mouth daily.    [provider]  tamsulosin (FLOMAX) 0.4 MG CAPS capsule Take two capsules by mouth once daily. Patient taking differently: 0.4 mg. Take two capsules by mouth once daily. 01/14/21   Wardell Honour, MD  Tiotropium Bromide-Olodaterol (STIOLTO RESPIMAT) 2.5-2.5 MCG/ACT AERS Inhale 2 puffs into the lungs daily. 07/21/21   Deneise Lever, MD  vitamin B-12 (CYANOCOBALAMIN) 1000 MCG tablet Take 1 tablet (1,000 mcg total) by mouth daily. 10/19/19   Reed, Tiffany L, DO    Allergies    Tolectin [tolmetin sodium], Montelukast sodium, Nsaids, Other, Augmentin [amoxicillin-pot clavulanate], and Neosporin [bacitracin-polymyxin b]  Review of Systems   Review of Systems  Constitutional:  Negative for chills and fever.  HENT:  Negative for ear pain and sore throat.   Eyes:  Negative for pain and visual disturbance.  Respiratory:  Positive for cough and shortness of breath.   Cardiovascular:  Negative for chest pain and palpitations.  Gastrointestinal:  Negative for abdominal pain and vomiting.  Genitourinary:  Negative for dysuria and hematuria.  Musculoskeletal:  Negative for arthralgias and back pain.  Skin:  Negative for color change and  rash.  Neurological:  Negative for seizures and syncope.  All other systems reviewed and are negative.  Physical Exam Updated Vital Signs  ED Triage Vitals  Enc Vitals Group     BP 07/27/21 1200 138/74     Pulse Rate 07/27/21 1200 (!) 110     Resp 07/27/21 1200 16     Temp 07/27/21 1209 98.9 F (37.2 C)     Temp Source 07/27/21 1209 Oral     SpO2 07/27/21 1200 99 %     Weight --      Height --      Head Circumference --  Peak Flow --      Pain Score 07/27/21 1201 4     Pain Loc --      Pain Edu? --      Excl. in [place]? --      Physical Exam Vitals and nursing note reviewed.  Constitutional:      General: He is not in acute distress.    Appearance: He is well-developed. He is not ill-appearing.  HENT:     Head: Normocephalic and atraumatic.     Nose: Nose normal.     Mouth/Throat:     Mouth: Mucous membranes are moist.  Eyes:     Extraocular Movements: Extraocular movements intact.     Conjunctiva/sclera: Conjunctivae normal.     Pupils: Pupils are equal, round, and reactive to light.  Cardiovascular:     Rate and Rhythm: Normal rate and regular rhythm.     Pulses: Normal pulses.     Heart sounds: No murmur heard. Pulmonary:     Effort: Respiratory distress present.     Breath sounds: Wheezing present.  Abdominal:     Palpations: Abdomen is soft.     Tenderness: There is no abdominal tenderness.  Musculoskeletal:        General: No swelling.     Cervical back: Normal range of motion and neck supple.  Skin:    General: Skin is warm and dry.     Capillary Refill: Capillary refill takes less than 2 seconds.  Neurological:     General: No focal deficit present.     Mental Status: He is alert.  Psychiatric:        Mood and Affect: Mood normal.    ED Results / Procedures / Treatments   Labs (all labs ordered are listed, but only abnormal results are displayed) Labs Reviewed  CBC WITH DIFFERENTIAL/PLATELET - Abnormal; Notable for the following components:       Result Value   WBC 13.3 (*)    Hemoglobin 11.9 (*)    HCT 37.7 (*)    Neutro Abs 11.8 (*)    Lymphs Abs 0.5 (*)    Abs Immature Granulocytes 0.12 (*)    All other components within normal limits  BASIC METABOLIC PANEL - Abnormal; Notable for the following components:   Chloride 95 (*)    CO2 34 (*)    Glucose, Bld 139 (*)    Creatinine, Ser 0.44 (*)    All other components within normal limits  BRAIN NATRIURETIC PEPTIDE - Abnormal; Notable for the following components:   B Natriuretic Peptide 215.3 (*)    All other components within normal limits  RESP PANEL BY RT-PCR (FLU A&B, COVID) ARPGX2  CULTURE, BLOOD (ROUTINE X 2)  CULTURE, BLOOD (ROUTINE X 2)  LACTIC ACID, PLASMA  LACTIC ACID, PLASMA  TROPONIN I (HIGH SENSITIVITY)  TROPONIN I (HIGH SENSITIVITY)    EKG EKG Interpretation  Date/Time:  Monday July 27 2021 12:45:34 EST Ventricular Rate:  98 PR Interval:  178 QRS Duration: 90 QT Interval:  315 QTC Calculation: 403 R Axis:   119 Text Interpretation: Sinus tachycardia Ventricular premature complex Anteroseptal infarct, age indeterminate Confirmed by Lennice Sites ([id number]) on 07/27/2021 1:00:20 PM  Radiology CT Angio Chest PE W and/or Wo Contrast  Result Date: 07/27/2021 CLINICAL DATA:  Pneumonia, hypoxia. EXAM: CT ANGIOGRAPHY CHEST WITH CONTRAST TECHNIQUE: Multidetector CT imaging of the chest was performed using the standard protocol during bolus administration of intravenous contrast. Multiplanar CT image reconstructions and MIPs were obtained to  evaluate the vascular anatomy. CONTRAST:  31mL OMNIPAQUE IOHEXOL 350 MG/ML SOLN COMPARISON:  February 28, 2019. FINDINGS: Cardiovascular: Small filling defect is seen in a peripheral branch of the left pulmonary artery in lingular segment of left upper lobe. Atherosclerosis of thoracic aorta is noted without aneurysm or dissection. Normal cardiac size. No pericardial effusion. Mediastinum/Nodes: Small sliding-type hiatal  hernia is noted. No adenopathy is noted. Thyroid gland is unremarkable. Lungs/Pleura: No pneumothorax or pleural effusion is noted. Emphysematous disease is noted. Right upper and lower lobe airspace opacities are noted concerning for pneumonia. Upper Abdomen: No acute abnormality. Musculoskeletal: No chest wall abnormality. No acute or significant osseous findings. Review of the MIP images confirms the above findings. IMPRESSION: Small peripheral pulmonary embolus is noted in upper lobe branch of left pulmonary artery. Critical Value/emergent results were called by telephone at the time of interpretation on 07/27/2021 at 1:58 pm to provider Leahanna Buser , who verbally acknowledged these results. Large airspace opacity is noted in right upper and lower lobes concerning for pneumonia. Small sliding-type hiatal hernia. Aortic Atherosclerosis (ICD10-I70.0) and Emphysema (ICD10-J43.9). Electronically Signed   By: Marijo Conception M.D.   On: 07/27/2021 13:58   DG Chest Portable 1 View  Result Date: 07/27/2021 CLINICAL DATA:  Recent diagnosis of pneumonia with persistent dyspnea and weakness and cough EXAM: PORTABLE CHEST 1 VIEW COMPARISON:  07/21/2021 chest radiograph. FINDINGS: Stable cardiomediastinal silhouette with normal heart size. No pneumothorax. No pleural effusion. Hyperinflated lungs. Emphysema. Worsening patchy consolidation in the upper right lung. Clear left lung. IMPRESSION: 1. Worsening patchy consolidation in the upper right lung. While potentially due to worsening pneumonia, an underlying central upper right lung mass is not excluded. Suggest further evaluation with chest CT with IV contrast. 2. Hyperinflated lungs and emphysema, suggesting COPD. Electronically Signed   By: Ilona Sorrel M.D.   On: 07/27/2021 12:32    Procedures .Critical Care Performed by: Lennice Sites, DO Authorized by: Lennice Sites, DO   Critical care provider statement:    Critical care time (minutes):  35    Critical care was necessary to treat or prevent imminent or life-threatening deterioration of the following conditions:  Respiratory failure and sepsis   Critical care was time spent personally by me on the following activities:  Blood draw for specimens, development of treatment plan with patient or surrogate, discussions with primary provider, evaluation of patient's response to treatment, examination of patient, obtaining history from patient or surrogate, ordering and performing treatments and interventions, ordering and review of laboratory studies, ordering and review of radiographic studies, re-evaluation of patient's condition, review of old charts and pulse oximetry   Care discussed with: admitting provider     Medications Ordered in ED Medications  cefTRIAXone (ROCEPHIN) 2 g in sodium chloride 0.9 % 100 mL IVPB (has no administration in time range)  azithromycin (ZITHROMAX) 500 mg in sodium chloride 0.9 % 250 mL IVPB (has no administration in time range)  methylPREDNISolone sodium succinate (SOLU-MEDROL) 125 mg/2 mL injection 125 mg (125 mg Intravenous Given 07/27/21 1256)  ipratropium-albuterol (DUONEB) 0.5-2.5 (3) MG/3ML nebulizer solution 3 mL (3 mLs Nebulization Given 07/27/21 1254)  iohexol (OMNIPAQUE) 350 MG/ML injection 75 mL (75 mLs Intravenous Contrast Given 07/27/21 1320)    ED Course  I have reviewed the triage vital signs and the nursing notes.  Pertinent labs & imaging results that were available during my care of the patient were reviewed by me and considered in my medical decision making (see chart for details).  MDM Rules/Calculators/A&P                           [name] is an 86 year old male with history of COPD on 3 L of oxygen, hypertension who presents the ED with cough and shortness of breath.  Patient with unremarkable vitals.  Increased work of breathing with diminished breath sounds and wheezing throughout.  Currently on a course antibiotics for  presumed pneumonia.  Feels like is gotten worse after being on antibiotics for 5 days.  He had previously been on antibiotics several weeks ago as well and steroids with not much improvement.  Chest x-ray today shows possibly worsening pneumonia or mass.  CT scan of the chest was performed that showed extensive pneumonia as well as small amount of blood clot.  No history of PE and not on blood thinners.  Sepsis work-up was initiated at this point as patient did have mild leukocytosis but no fever.  Heart rate was mildly elevated when he first came in as well.  Broad-spectrum IV antibiotics have been started.  Lab work is otherwise unremarkable.  We will likely start patient on oral anticoagulant given small clot burden but will discuss with hospitalist about this versus possibly heparin.  Viral testing negative.  EKG shows sinus rhythm.  No ischemic changes.  Doubt cardiac process.  Overall suspect pneumonia causing COPD exacerbation as well as incidental PE found.  Has been given breathing treatment and IV steroids as well.  Work of breathing has improved.  Will start heparin for PE after talking medicine.  This chart was dictated using voice recognition software.  Despite best efforts to proofread,  errors can occur which can change the documentation meaning.   Final Clinical Impression(s) / ED Diagnoses Final diagnoses:  Community acquired pneumonia, unspecified laterality  Other acute pulmonary embolism, unspecified whether acute cor pulmonale present [facility])    Rx / DC Orders ED Discharge Orders     None        Lennice Sites, DO 07/27/21 [place], [place], DO 07/27/21 1426

## 2021-07-27 NOTE — H&P (Signed)
History and Physical    [name] IRC:[id number] DOB: [birth year] DOA: 07/27/2021  PCP: Gayland Curry, DO  Patient coming from: Home  I have personally briefly reviewed patient's old medical records in [place]  Chief Complaint: Worsening shortness of breath  HPI: [name] is a 86 y.o. male with medical history significant of chronic respiratory failure secondary to COPD on 3 L nasal cannula chronically at rest and at bedtime and 4 to 6 L on exertion and in the shower per patient, hypertension, hyperlipidemia, coronary artery disease, BPH presenting to the freestanding [place] ED with complaints of 1 month history of worsening shortness of breath, cough, wheezing. Patient states 1 month prior to admission had had increasing shortness of breath with diffuse wheezing, spoke with PCP and got treated with a 10-day course of doxycycline and prednisone.  Patient stated after treatment in approximately 2 weeks later had no significant improvement saw his PCP and sent to see his pulmonologist, Dr. Keturah Barre whom he saw 6 days prior to admission.  Patient stated chest x-ray done was consistent with a pneumonia and patient placed on a 7-day course of antibiotics.  Patient stated day of admission was day 5 of antibiotics and has been compliant with antibiotics.  Due to worsening shortness of breath, diffuse wheezing and cough patient presented to the ED.  Patient does endorse a bout of hemoptysis 1 to 2 days prior to admission.  Patient endorses a productive cough of yellowish sputum.  Patient also does endorse some sore throat and left nasal bleed 1 to 2 days ago which has since resolved.  Patient denies any fevers, no chills, no nausea, no vomiting, no abdominal pain, no chest pain, no diarrhea, no constipation, no melena, no hematemesis, no hematochezia, no lightheadedness, no syncopal episode.  Patient denies any recent surgeries.  Denies any recent long car rides or  travel.  Patient denies any prior history of PE or DVT.  Patient does endorse chronic lower extremity edema.  ED Course: Patient seen in the ED, noted to have a temp of 97.4, noted to be tachycardic with a heart rate of 110, blood pressure stable, sats of 98% on 4 L nasal cannula.  Basic metabolic profile done with a chloride of 95, bicarb of 34, glucose of 139 otherwise was within normal limits.  BNP noted at 215.3.  High-sensitivity troponin negative x2.  Lactic acid level at 1.4.  CBC done with a white count of 13.3 with left shift, hemoglobin of 11.9 otherwise was within normal limits.  INR noted at 1.1 COVID-19 PCR negative, influenza A and B PCR negative.  Chest x-ray done with worsening patchy consolidation in the upper right lung could be potentially to worsening pneumonia, and underlying central upper right lung mass not excluded recommending CT with IV contrast.  Hyperinflated lungs and emphysema suggest COPD.  CT angiogram chest done with small peripheral pulmonary embolus noted in upper lobe branch of the left pulmonary artery, large airspace opacity noted in right upper and lower lobes concerning for pneumonia.  Small sliding type hiatal hernia.  Patient placed on IV Rocephin, IV azithromycin, IV heparin.  Hospitalist called for admission for further evaluation and management  Review of Systems: As per HPI otherwise all other systems reviewed and are negative.  Past Medical History:  Diagnosis Date   Allergic rhinitis, cause unspecified    Arthritis    COPD (chronic obstructive pulmonary disease) (HCC)    Dyspnea  Dysrhythmia    "1 episode of tachycardia in 1980's, been on it ever since"   Emphysema of lung ([place])    Hypertension    Left inguinal hernia 02/07/2019   Low hemoglobin    low level   Neuromuscular disorder (HCC)    Neuropathy    feet   Pneumonia    History of, last Oct 2019   Pulmonary nodule     Past Surgical History:  Procedure Laterality Date   [place]  2003   performed at [facility], Dr. Fransico Him   CATARACT EXTRACTION  2008   Dr. Darleen Crocker   CATARACT EXTRACTION, BILATERAL     EYE SURGERY     INGUINAL HERNIA REPAIR Left 02/07/2019   Procedure: OPEN REPAIR LEFT INGUINAL HERNIA WITH MESH;  Surgeon: Fanny Skates, MD;  Location: [place];  Service: General;  Laterality: Left;  SPINAL AND TAP BLOCK ANESTHESIA   ROTATOR CUFF REPAIR  2002   Dr. Kathryne Hitch   TONSILLECTOMY  1945   VASECTOMY      Social History  reports that he quit smoking about 30 years ago. His smoking use included cigarettes. He has a 76.00 pack-year smoking history. He has never used smokeless tobacco. He reports current alcohol use of about 7.0 standard drinks per week. He reports that he does not use drugs.  Allergies  Allergen Reactions   Tolectin [Tolmetin Sodium] Other (See Comments)    BP low and passed out   Montelukast Sodium Other (See Comments)    Unknown rxn;   Nsaids    Other Other (See Comments)   Augmentin [Amoxicillin-Pot Clavulanate] Nausea And Vomiting    Did it involve swelling of the face/tongue/throat, SOB, or low BP? No Did it involve sudden or severe rash/hives, skin peeling, or any reaction on the inside of your mouth or nose? No Did you need to seek medical attention at a hospital or doctor's office? No When did it last happen? Last year   If all above answers are "NO", may proceed with cephalosporin use.   Neosporin [Bacitracin-Polymyxin B] Rash    Family History  Problem Relation Age of Onset   COPD Mother    Stroke Mother    Stroke Father    Coronary artery disease Other        [facility]   Mother deceased age 79 from stroke/history of COPD.  Father deceased age 20 of 18 with a history of stroke.  Prior to Admission medications   Medication Sig Start Date End Date Taking? Authorizing Provider  acetaminophen (TYLENOL) 650 MG CR tablet Take 650 mg by mouth daily.    [provider]  BREZTRI  AEROSPHERE 160-9-4.8 MCG/ACT AERO INHALE 2 PUFFS INTO THE LUNGS IN THE MORNING AND AT BEDTIME Patient taking differently: Inhale 2 puffs into the lungs in the morning and at bedtime. 09/12/20   Baird Lyons D, MD  DULoxetine (CYMBALTA) 20 MG capsule Take 1 capsule (20 mg total) by mouth daily. 09/25/20   Reed, Tiffany L, DO  ipratropium-albuterol (DUONEB) 0.5-2.5 (3) MG/3ML SOLN Take 3 mLs by nebulization every 6 (six) hours as needed (for SOB AND wheezing every 6 hours AND PRN). Patient taking differently: Take 3 mLs by nebulization every 6 (six) hours as needed (wheezing/shortness of breath). 02/23/21   Deneise Lever, MD  ketoconazole (NIZORAL) 2 % shampoo Apply 1 application topically 2 (two) times a week. 05/01/20   [provider]  levalbuterol Penne Lash HFA)  45 MCG/ACT inhaler Inhale 2 puffs every 6 hours as needed- rescue Patient taking differently: Inhale 2 puffs into the lungs every 6 (six) hours as needed for wheezing or shortness of breath. 07/21/21   Deneise Lever, MD  levofloxacin (LEVAQUIN) 750 MG tablet Take 1 tablet (750 mg total) by mouth daily. Patient taking differently: Take 750 mg by mouth daily. For 7 days 07/22/21   Deneise Lever, MD  losartan (COZAAR) 25 MG tablet Take 1 tablet (25 mg total) by mouth daily. Please keep upcoming appt in April 2022 with Dr. Burt Knack before anymore refills. Thank you Patient taking differently: Take 25 mg by mouth daily. 08/25/20   Reed, Tiffany L, DO  OXYGEN Inhale 3-6 L into the lungs continuous.     [provider]  polyethylene glycol powder (GLYCOLAX/MIRALAX) powder Take 17 g by mouth daily.    [provider]  pravastatin (PRAVACHOL) 40 MG tablet TAKE 1 TABLET(40 MG) BY MOUTH DAILY Patient taking differently: Take 40 mg by mouth daily. 10/23/20   Reed, Tiffany L, DO  protein supplement shake (PREMIER PROTEIN) LIQD Take 2 oz by mouth daily.    [provider]  tamsulosin (FLOMAX) 0.4 MG CAPS capsule Take  two capsules by mouth once daily. Patient taking differently: 0.4 mg. Take two capsules by mouth once daily. 01/14/21   Wardell Honour, MD  Tiotropium Bromide-Olodaterol (STIOLTO RESPIMAT) 2.5-2.5 MCG/ACT AERS Inhale 2 puffs into the lungs daily. 07/21/21   Deneise Lever, MD  vitamin B-12 (CYANOCOBALAMIN) 1000 MCG tablet Take 1 tablet (1,000 mcg total) by mouth daily. 10/19/19   Gayland Curry, DO    Physical Exam: Vitals:   07/27/21 1254 07/27/21 1300 07/27/21 1400 07/27/21 1619  BP:  (!) 141/73 124/80 131/70  Pulse:  95 99 (!) 110  Resp:  16 16 18   Temp:    (!) 97.4 F (36.3 C)  TempSrc:    Oral  SpO2: 99% 98% 97% 96%    Constitutional: NAD, calm, comfortable.  Frail Vitals:   07/27/21 1254 07/27/21 1300 07/27/21 1400 07/27/21 1619  BP:  (!) 141/73 124/80 131/70  Pulse:  95 99 (!) 110  Resp:  16 16 18   Temp:    (!) 97.4 F (36.3 C)  TempSrc:    Oral  SpO2: 99% 98% 97% 96%   Eyes: PERRL, lids and conjunctivae normal ENMT: Mucous membranes are dry. Posterior pharynx clear of any exudate or lesions.Normal dentition.  Neck: normal, supple, no masses, no thyromegaly Respiratory: Some scattered coarse breath sounds on the right.  Poor to fair air movement.  Diffuse expiratory wheezing.  No crackles noted.  No significant use of accessory muscles of respiration. Cardiovascular: Tachycardia, no murmurs rubs or gallops.  1+ left lower extremity edema.  Some petechial noted on left medial malleolus. Abdomen: no tenderness, no masses palpated. No hepatosplenomegaly. Bowel sounds positive.  Musculoskeletal: no clubbing / cyanosis. No joint deformity upper and lower extremities. Good ROM, no contractures. Normal muscle tone.  Skin: no rashes, lesions, ulcers. No induration Neurologic: CN 2-12 grossly intact. Sensation intact, DTR normal. Strength 5/5 in all 4.  Psychiatric: Normal judgment and insight. Alert and oriented x 3. Normal mood.    Labs on Admission: I have personally  reviewed following labs and imaging studies  CBC: Recent Labs  Lab 07/27/21 1216  WBC 13.3*  NEUTROABS 11.8*  HGB 11.9*  HCT 37.7*  MCV 88.3  PLT 604    Basic Metabolic Panel: Recent Labs  Lab 07/27/21 1216  NA 135  K 4.5  CL 95*  CO2 34*  GLUCOSE 139*  BUN 14  CREATININE 0.44*  CALCIUM 9.1    GFR: Estimated Creatinine Clearance: 60.2 mL/min (A) (by C-G formula based on SCr of 0.44 mg/dL (L)).  Liver Function Tests: No results for input(s): AST, ALT, ALKPHOS, BILITOT, PROT, ALBUMIN in the last 168 hours.  Urine analysis:    Component Value Date/Time   COLORURINE YELLOW 02/28/2019 0755   APPEARANCEUR CLEAR 02/28/2019 0755   LABSPEC 1.014 02/28/2019 0755   PHURINE 5.0 02/28/2019 0755   GLUCOSEU NEGATIVE 02/28/2019 0755   HGBUR NEGATIVE 02/28/2019 0755   BILIRUBINUR NEGATIVE 02/28/2019 0755   KETONESUR 20 (A) 02/28/2019 0755   PROTEINUR NEGATIVE 02/28/2019 0755   NITRITE NEGATIVE 02/28/2019 0755   LEUKOCYTESUR NEGATIVE 02/28/2019 0755    Radiological Exams on Admission: CT Angio Chest PE W and/or Wo Contrast  Result Date: 07/27/2021 CLINICAL DATA:  Pneumonia, hypoxia. EXAM: CT ANGIOGRAPHY CHEST WITH CONTRAST TECHNIQUE: Multidetector CT imaging of the chest was performed using the standard protocol during bolus administration of intravenous contrast. Multiplanar CT image reconstructions and MIPs were obtained to evaluate the vascular anatomy. CONTRAST:  65mL OMNIPAQUE IOHEXOL 350 MG/ML SOLN COMPARISON:  February 28, 2019. FINDINGS: Cardiovascular: Small filling defect is seen in a peripheral branch of the left pulmonary artery in lingular segment of left upper lobe. Atherosclerosis of thoracic aorta is noted without aneurysm or dissection. Normal cardiac size. No pericardial effusion. Mediastinum/Nodes: Small sliding-type hiatal hernia is noted. No adenopathy is noted. Thyroid gland is unremarkable. Lungs/Pleura: No pneumothorax or pleural effusion is noted.  Emphysematous disease is noted. Right upper and lower lobe airspace opacities are noted concerning for pneumonia. Upper Abdomen: No acute abnormality. Musculoskeletal: No chest wall abnormality. No acute or significant osseous findings. Review of the MIP images confirms the above findings. IMPRESSION: Small peripheral pulmonary embolus is noted in upper lobe branch of left pulmonary artery. Critical Value/emergent results were called by telephone at the time of interpretation on 07/27/2021 at 1:58 pm to provider ADAM CURATOLO , who verbally acknowledged these results. Large airspace opacity is noted in right upper and lower lobes concerning for pneumonia. Small sliding-type hiatal hernia. Aortic Atherosclerosis (ICD10-I70.0) and Emphysema (ICD10-J43.9). Electronically Signed   By: Marijo Conception M.D.   On: 07/27/2021 13:58   DG Chest Portable 1 View  Result Date: 07/27/2021 CLINICAL DATA:  Recent diagnosis of pneumonia with persistent dyspnea and weakness and cough EXAM: PORTABLE CHEST 1 VIEW COMPARISON:  07/21/2021 chest radiograph. FINDINGS: Stable cardiomediastinal silhouette with normal heart size. No pneumothorax. No pleural effusion. Hyperinflated lungs. Emphysema. Worsening patchy consolidation in the upper right lung. Clear left lung. IMPRESSION: 1. Worsening patchy consolidation in the upper right lung. While potentially due to worsening pneumonia, an underlying central upper right lung mass is not excluded. Suggest further evaluation with chest CT with IV contrast. 2. Hyperinflated lungs and emphysema, suggesting COPD. Electronically Signed   By: Ilona Sorrel M.D.   On: 07/27/2021 12:32    EKG: Independently reviewed.  Sinus tachycardia.  No ischemic changes noted.  Assessment/Plan Principal Problem:   Acute on chronic respiratory failure with hypoxia (HCC) Active Problems:   Pneumonia   COPD with acute exacerbation (HCC)   Allergic rhinitis   COPD mixed type (HCC)   CAD (coronary artery  disease)   Hyperlipidemia   Hypertension   Benign prostatic hyperplasia with nocturia   Venous insufficiency of both lower extremities   #  1 acute on chronic respiratory failure with hypoxia secondary to multifocal right-sided pneumonia and pulmonary emboli and probable COPD exacerbation. -Patient presented with worsening shortness of breath, diffuse wheezing, productive cough which have been ongoing for the past month with no significant improvement after 2 rounds of antibiotics and prednisone. -Chest x-ray done on presentation with concerns of a right upper lobe pneumonia.  Patient also noted to have a leukocytosis with a left shift. -CT angiogram chest done with concerning for small peripheral left upper lobe PE, large airspace opacity in the right upper and lower lobes concerning for pneumonia. -Check a sputum gram stain and culture, urine Legionella antigen, urine pneumococcus antigen, MRSA PCR.[name]  Blood cultures ordered and pending. -Place empirically on IV cefepime, IV vancomycin, IV azithromycin. -If MRSA PCR is negative we will discontinue IV vancomycin. -Placed on neb treatment, Mucinex, mucolytic's, IV heparin.  2.  Multifocal right-sided pneumonia -Noted on chest x-ray and CT angiogram chest. -Check a sputum gram stain and culture, urine Legionella antigen, urine pneumococcus antigen, MRSA PCR.. -Blood cultures pending. -Consult with SLP to rule out aspiration -Place empirically on IV cefepime, IV vancomycin, IV azithromycin. -If MRSA PCR is negative we will discontinue IV vancomycin. -Supportive care.  3.  Pulmonary emboli -Noted on CT angiogram chest. -Patient with no prior history of DVT or PE, no recent surgeries, no recent long car rides or travel. -Check a 2D echo, check lower extremity Dopplers. -Continue empiric IV heparin.  4.  Acute COPD exacerbation -Likely exacerbated by multifocal right-sided pneumonia. -Placed on scheduled Xopenex, Atrovent nebs, Pulmicort,  Claritin, Flonase, PPI, empiric IV antibiotics.  5.  Hypertension -Resume home regimen ARB.  6.  Hyperlipidemia -Resume home regimen statin.  7.  BPH -Resume home regimen Flomax.  DVT prophylaxis: Heparin Code Status:   DNR Family Communication:  Updated patient.  No family at bedside. Disposition Plan:   Patient is from:  Home  Anticipated DC to:  To be determined  Anticipated DC date:  3 to 4 days  Anticipated DC barriers: Clinical improvement Consults called:  None Admission status:  Admit to inpatient/telemetry.  Severity of Illness: The appropriate patient status for this patient is INPATIENT. Inpatient status is judged to be reasonable and necessary in order to provide the required intensity of service to ensure the patient's safety. The patient's presenting symptoms, physical exam findings, and initial radiographic and laboratory data in the context of their chronic comorbidities is felt to place them at high risk for further clinical deterioration. Furthermore, it is not anticipated that the patient will be medically stable for discharge from the hospital within 2 midnights of admission.   * I certify that at the point of admission it is my clinical judgment that the patient will require inpatient hospital care spanning beyond 2 midnights from the point of admission due to high intensity of service, high risk for further deterioration and high frequency of surveillance required.*    Irine Seal MD [facility]  How to contact the [facility] Attending or Consulting provider [place] or covering provider during after hours [place], for this patient?   Check the care team in [facility] and look for a) attending/consulting [facility] provider listed and b) the [facility] team listed Log into [email or web address] and use [place]'s universal password to access. If you do not have the password, please contact the hospital operator. Locate the [facility] provider you are looking for under [facility]  and page to a number that you can be directly  reached. If you still have difficulty reaching the provider, please page the [facility] (Director on Call) for the Hospitalists listed on amion for assistance.  07/27/2021, 5:30 PM

## 2021-07-27 NOTE — ED Notes (Signed)
Patient transported to CT 

## 2021-07-27 NOTE — Sepsis Progress Note (Signed)
Code sepsis protocol being monitored by eLink. 

## 2021-07-27 NOTE — Progress Notes (Signed)
ANTICOAGULATION CONSULT NOTE - Initial Consult  Pharmacy Consult for heparin Indication: pulmonary embolus  Allergies  Allergen Reactions   Tolectin [Tolmetin Sodium] Other (See Comments)    BP low and passed out   Montelukast Sodium Other (See Comments)    Unknown rxn;   Nsaids    Other Other (See Comments)   Augmentin [Amoxicillin-Pot Clavulanate] Nausea And Vomiting    Did it involve swelling of the face/tongue/throat, SOB, or low BP? No Did it involve sudden or severe rash/hives, skin peeling, or any reaction on the inside of your mouth or nose? No Did you need to seek medical attention at a hospital or doctor's office? No When did it last happen? Last year   If all above answers are "NO", may proceed with cephalosporin use.   Neosporin [Bacitracin-Polymyxin B] Rash    Patient Measurements:   Heparin Dosing Weight: 62 kg  Vital Signs: Temp: 98.9 F (37.2 C) (12/12 1209) Temp Source: Oral (12/12 1209) BP: 124/80 (12/12 1400) Pulse Rate: 99 (12/12 1400)  Labs: Recent Labs    07/27/21 1213 07/27/21 1216  HGB  --  11.9*  HCT  --  37.7*  PLT  --  354  CREATININE  --  0.44*  TROPONINIHS 7  --     Estimated Creatinine Clearance: 60.2 mL/min (A) (by C-G formula based on SCr of 0.44 mg/dL (L)).   Medical History: Past Medical History:  Diagnosis Date   Allergic rhinitis, cause unspecified    Arthritis    COPD (chronic obstructive pulmonary disease) (HCC)    Dyspnea    Dysrhythmia    "1 episode of tachycardia in 1980's, been on it ever since"   Emphysema of lung ([place])    Hypertension    Left inguinal hernia 02/07/2019   Low hemoglobin    low level   Neuromuscular disorder (HCC)    Neuropathy    feet   Pneumonia    History of, last Oct 2019   Pulmonary nodule     Medications:  Scheduled:   heparin  4,000 Units Intravenous Once   Infusions:   sodium chloride 10 mL/hr at 07/27/21 1422   azithromycin (ZITHROMAX) 500 MG IVPB (Vial-Mate [employer])      cefTRIAXone (ROCEPHIN)  IV 2 g (07/27/21 1423)   heparin      Assessment: Patient with previous respiratory problems presents with PE  Goal of Therapy:  Heparin level 0.3-0.7 units/ml Monitor platelets by anticoagulation protocol: Yes   Plan:  Give 4000 units bolus x 1 Start heparin infusion at 1100 units/hr Check anti-Xa level in 8 hours and daily while on heparin Continue to monitor H&H and platelets  Mallie Mussel A Preslee Regas 07/27/2021,2:36 PM

## 2021-07-28 ENCOUNTER — Inpatient Hospital Stay (HOSPITAL_COMMUNITY): Payer: Medicare Other

## 2021-07-28 DIAGNOSIS — I2699 Other pulmonary embolism without acute cor pulmonale: Secondary | ICD-10-CM

## 2021-07-28 DIAGNOSIS — J9621 Acute and chronic respiratory failure with hypoxia: Secondary | ICD-10-CM

## 2021-07-28 LAB — COMPREHENSIVE METABOLIC PANEL
ALT: 23 U/L (ref 0–44)
AST: 18 U/L (ref 15–41)
Albumin: 2.1 g/dL — ABNORMAL LOW (ref 3.5–5.0)
Alkaline Phosphatase: 43 U/L (ref 38–126)
Anion gap: 7 (ref 5–15)
BUN: 17 mg/dL (ref 8–23)
CO2: 28 mmol/L (ref 22–32)
Calcium: 8 mg/dL — ABNORMAL LOW (ref 8.9–10.3)
Chloride: 99 mmol/L (ref 98–111)
Creatinine, Ser: 0.49 mg/dL — ABNORMAL LOW (ref 0.61–1.24)
GFR, Estimated: >60 mL/min (ref >60)
Glucose, Bld: 151 mg/dL — ABNORMAL HIGH (ref 70–99)
Potassium: 4.3 mmol/L (ref 3.5–5.1)
Sodium: 134 mmol/L — ABNORMAL LOW (ref 135–145)
Total Bilirubin: 0.5 mg/dL (ref 0.3–1.2)
Total Protein: 5.2 g/dL — ABNORMAL LOW (ref 6.5–8.1)

## 2021-07-28 LAB — STREP PNEUMONIAE URINARY ANTIGEN: Strep Pneumo Urinary Antigen: NEGATIVE

## 2021-07-28 LAB — CBC WITH DIFFERENTIAL/PLATELET
Abs Immature Granulocytes: 0.19 10*3/uL — ABNORMAL HIGH (ref 0.00–0.07)
Basophils Absolute: 0 10*3/uL (ref 0.0–0.1)
Basophils Relative: 0 %
Eosinophils Absolute: 0 10*3/uL (ref 0.0–0.5)
Eosinophils Relative: 0 %
HCT: 32.5 % — ABNORMAL LOW (ref 39.0–52.0)
Hemoglobin: 10.6 g/dL — ABNORMAL LOW (ref 13.0–17.0)
Immature Granulocytes: 2 %
Lymphocytes Relative: 4 %
Lymphs Abs: 0.5 10*3/uL — ABNORMAL LOW (ref 0.7–4.0)
MCH: 28.5 pg (ref 26.0–34.0)
MCHC: 32.6 g/dL (ref 30.0–36.0)
MCV: 87.4 fL (ref 80.0–100.0)
Monocytes Absolute: 0.3 10*3/uL (ref 0.1–1.0)
Monocytes Relative: 2 %
Neutro Abs: 10.2 10*3/uL — ABNORMAL HIGH (ref 1.7–7.7)
Neutrophils Relative %: 92 %
Platelets: 287 10*3/uL (ref 150–400)
RBC: 3.72 MIL/uL — ABNORMAL LOW (ref 4.22–5.81)
RDW: 13.2 % (ref 11.5–15.5)
WBC: 11.2 10*3/uL — ABNORMAL HIGH (ref 4.0–10.5)
nRBC: 0 % (ref 0.0–0.2)

## 2021-07-28 LAB — HEPARIN LEVEL (UNFRACTIONATED)
Heparin Unfractionated: 0.57 IU/mL (ref 0.30–0.70)
Heparin Unfractionated: 0.46 IU/mL (ref 0.30–0.70)

## 2021-07-28 LAB — ECHOCARDIOGRAM COMPLETE
Height: 69 in
S' Lateral: 2.2 cm
Weight: 2135.99 oz

## 2021-07-28 LAB — PHOSPHORUS: Phosphorus: 3.5 mg/dL (ref 2.5–4.6)

## 2021-07-28 LAB — MAGNESIUM: Magnesium: 1.9 mg/dL (ref 1.7–2.4)

## 2021-07-28 LAB — GLUCOSE, CAPILLARY: Glucose-Capillary: 130 mg/dL — ABNORMAL HIGH (ref 70–99)

## 2021-07-28 MED ORDER — LEVALBUTEROL HCL 0.63 MG/3ML IN NEBU
0.6300 mg | INHALATION_SOLUTION | Freq: Two times a day (BID) | RESPIRATORY_TRACT | Status: DC
  Administered 2021-07-28 – 2021-07-30 (×4): 0.63 mg via RESPIRATORY_TRACT
  Filled 2021-07-28 (×4): qty 3

## 2021-07-28 MED ORDER — HEPARIN BOLUS VIA INFUSION
2000.0000 [IU] | Freq: Once | INTRAVENOUS | Status: AC
  Administered 2021-07-28: 2000 [IU] via INTRAVENOUS
  Filled 2021-07-28: qty 2000

## 2021-07-28 MED ORDER — IPRATROPIUM BROMIDE 0.02 % IN SOLN
0.5000 mg | Freq: Two times a day (BID) | RESPIRATORY_TRACT | Status: DC
  Administered 2021-07-28 – 2021-07-30 (×4): 0.5 mg via RESPIRATORY_TRACT
  Filled 2021-07-28 (×4): qty 2.5

## 2021-07-28 MED ORDER — METHYLPREDNISOLONE SODIUM SUCC 125 MG IJ SOLR
60.0000 mg | Freq: Two times a day (BID) | INTRAMUSCULAR | Status: DC
  Administered 2021-07-28 – 2021-07-30 (×5): 60 mg via INTRAVENOUS
  Filled 2021-07-28 (×5): qty 2

## 2021-07-28 NOTE — Progress Notes (Signed)
ANTICOAGULATION CONSULT NOTE - Follow Up Pharmacy Consult for heparin Indication: pulmonary embolus  Allergies  Allergen Reactions   Tolectin [Tolmetin Sodium] Other (See Comments)    BP low and passed out   Montelukast Sodium Other (See Comments)    Unknown reaction   Nsaids Other (See Comments)    MD told pt not to use   Augmentin [Amoxicillin-Pot Clavulanate] Nausea And Vomiting    Did it involve swelling of the face/tongue/throat, SOB, or low BP? No Did it involve sudden or severe rash/hives, skin peeling, or any reaction on the inside of your mouth or nose? No Did you need to seek medical attention at a hospital or doctor's office? No When did it last happen? Last year   If all above answers are "NO", may proceed with cephalosporin use.   Neosporin [Bacitracin-Polymyxin B] Rash    Patient Measurements: Height: 5\' 9"  (175.3 cm) Weight: 60.6 kg (133 lb 8 oz) IBW/kg (Calculated) : 70.7 Heparin Dosing Weight: n/a. Use TBW of 60 kg  Vital Signs: Temp: 98.2 F (36.8 C) (12/13 0338) Temp Source: Oral (12/13 0338) BP: 155/81 (12/13 0338) Pulse Rate: 87 (12/13 0338)  Labs: Recent Labs    07/27/21 1213 07/27/21 1216 07/27/21 1416 07/27/21 2303 07/28/21 0354 07/28/21 0850  HGB  --  11.9*  --   --  10.6*  --   HCT  --  37.7*  --   --  32.5*  --   PLT  --  354  --   --  287  --   APTT 29  --   --   --   --   --   LABPROT 13.7  --   --   --   --   --   INR 1.1  --   --   --   --   --   HEPARINUNFRC  --   --   --  0.20*  --  0.57  CREATININE  --  0.44*  --   --  0.49*  --   TROPONINIHS 7  --  8  --   --   --      Estimated Creatinine Clearance: 58.9 mL/min (A) (by C-G formula based on SCr of 0.49 mg/dL (L)).   Medical History: Past Medical History:  Diagnosis Date   Allergic rhinitis, cause unspecified    Arthritis    COPD (chronic obstructive pulmonary disease) (HCC)    Dyspnea    Dysrhythmia    "1 episode of tachycardia in 1980's, been on it ever since"    Emphysema of lung ([place])    Hypertension    Left inguinal hernia 02/07/2019   Low hemoglobin    low level   Neuromuscular disorder (HCC)    Neuropathy    feet   Pneumonia    History of, last Oct 2019   Pulmonary nodule     Medications:  Scheduled:   budesonide (PULMICORT) nebulizer solution  0.5 mg Nebulization BID   DULoxetine  20 mg Oral Daily   fluticasone  2 spray Each Nare Daily   guaiFENesin  1,200 mg Oral BID   ipratropium  0.5 mg Nebulization BID   levalbuterol  0.63 mg Nebulization BID   loratadine  10 mg Oral Daily   losartan  25 mg Oral Daily   methylPREDNISolone (SOLU-MEDROL) injection  60 mg Intravenous Q12H   pantoprazole  40 mg Oral Q0600   polyethylene glycol  17 g Oral Daily   pravastatin  40 mg Oral Daily   Ensure Max Protein  2 oz Oral Daily   senna  1 tablet Oral BID   sodium chloride flush  3 mL Intravenous Q12H   tamsulosin  0.4 mg Oral Daily   vitamin B-12  1,000 mcg Oral Daily   Infusions:   sodium chloride Stopped (07/27/21 1529)   sodium chloride Stopped (07/27/21 1440)   sodium chloride 75 mL/hr at 07/27/21 1846   azithromycin     ceFEPime (MAXIPIME) IV 2 g (07/28/21 0345)   heparin 1,300 Units/hr (07/28/21 0741)   vancomycin 1,250 mg (07/27/21 1848)    Assessment: Patient is an 66 yoM presenting with new PE.  Pharmacy consulted to dose IV heparin.  Not on any anticoagulation PTA.   Baseline Hgb slightly low at 11.9, Plt WNL.   07/28/2021 @ 10:00 AM: HL = 0.57 is therapeutic on heparin infusion of 1300 units/hr Confirmed with RN that heparin infusing at correct rate. No further hemoptysis or other signs of bleeding this morning.  CBC: Hgb slightly decreased; Plt WNL.   Goal of Therapy:  Heparin level 0.3-0.7 units/ml Monitor platelets by anticoagulation protocol: Yes   Plan:  Continue heparin infusion at current rate of 1300 units/hr Check confirmatory 8 hour HL HL, CBC daily while on heparin infusion Monitor for signs of  bleeding  Lenis Noon PharmD 07/28/2021,10:00 AM

## 2021-07-28 NOTE — Progress Notes (Signed)
[name] for heparin Indication: pulmonary embolus  Allergies  Allergen Reactions   Tolectin [Tolmetin Sodium] Other (See Comments)    BP low and passed out   Montelukast Sodium Other (See Comments)    Unknown reaction   Nsaids Other (See Comments)    MD told pt not to use   Augmentin [Amoxicillin-Pot Clavulanate] Nausea And Vomiting    Did it involve swelling of the face/tongue/throat, SOB, or low BP? No Did it involve sudden or severe rash/hives, skin peeling, or any reaction on the inside of your mouth or nose? No Did you need to seek medical attention at a hospital or doctor's office? No When did it last happen? Last year   If all above answers are "NO", may proceed with cephalosporin use.   Neosporin [Bacitracin-Polymyxin B] Rash    Patient Measurements: Height: 5\' 9"  (175.3 cm) Weight: 126.1 kg (278 lb) IBW/kg (Calculated) : 70.7 Heparin Dosing Weight: 62 kg  Vital Signs: Temp: 98.2 F (36.8 C) (12/12 2336) Temp Source: Oral (12/12 2336) BP: 152/77 (12/12 2336) Pulse Rate: 89 (12/12 2336)  Labs: Recent Labs    07/27/21 1213 07/27/21 1216 07/27/21 1416 07/27/21 2303  HGB  --  11.9*  --   --   HCT  --  37.7*  --   --   PLT  --  354  --   --   APTT 29  --   --   --   LABPROT 13.7  --   --   --   INR 1.1  --   --   --   HEPARINUNFRC  --   --   --  0.20*  CREATININE  --  0.44*  --   --   TROPONINIHS 7  --  8  --      Estimated Creatinine Clearance: 90.3 mL/min (A) (by C-G formula based on SCr of 0.44 mg/dL (L)).   Medical History: Past Medical History:  Diagnosis Date   Allergic rhinitis, cause unspecified    Arthritis    COPD (chronic obstructive pulmonary disease) (HCC)    Dyspnea    Dysrhythmia    "1 episode of tachycardia in 1980's, been on it ever since"   Emphysema of lung ([place])    Hypertension    Left inguinal hernia 02/07/2019   Low hemoglobin    low level   Neuromuscular disorder (HCC)    Neuropathy     feet   Pneumonia    History of, last Oct 2019   Pulmonary nodule     Medications:  Scheduled:   budesonide (PULMICORT) nebulizer solution  0.5 mg Nebulization BID   DULoxetine  20 mg Oral Daily   fluticasone  2 spray Each Nare Daily   guaiFENesin  1,200 mg Oral BID   heparin  2,000 Units Intravenous Once   ipratropium  0.5 mg Nebulization TID   levalbuterol  0.63 mg Nebulization TID   loratadine  10 mg Oral Daily   losartan  25 mg Oral Daily   pantoprazole  40 mg Oral Q0600   polyethylene glycol  17 g Oral Daily   pravastatin  40 mg Oral Daily   Ensure Max Protein  2 oz Oral Daily   senna  1 tablet Oral BID   sodium chloride flush  3 mL Intravenous Q12H   tamsulosin  0.4 mg Oral Daily   vitamin B-12  1,000 mcg Oral Daily   Infusions:   sodium chloride Stopped (07/27/21  1529)   sodium chloride Stopped (07/27/21 1440)   sodium chloride 75 mL/hr at 07/27/21 1846   azithromycin     ceFEPime (MAXIPIME) IV 2 g (07/27/21 2104)   heparin 1,100 Units/hr (07/27/21 1533)   vancomycin 1,250 mg (07/27/21 1848)    Assessment: Patient with previous respiratory problems presents with new PE.  Pharmacy consulted to dose IV heparin.  Not on any anticoagulation PTA.   Baseline CBC; Hg slightly low at 11.9, pltc WNL, INR, aptt & Scr at baseline.   07/28/2021 @ 12:24 AM: Initial heparin level 0.2- subtherapeutic on IV heparin 1100 units/hr Pt has had some pink-tinged sputum when coughing, no other bleeding concerns noted.  No infusion related concerns per RN  Goal of Therapy:  Heparin level 0.3-0.7 units/ml Monitor platelets by anticoagulation protocol: Yes   Plan:  Re-bolus IV heparin 2000 units IV x1 Increase heparin infusion to 1300 units/hr Recheck heparin level in 8hrs Monitor closely for s/sx of bleeding Daily heparin level & CBC while on heparin F/U long-term anticoagulation plans  [name] PharmD 07/28/2021,12:21 AM

## 2021-07-28 NOTE — Progress Notes (Signed)
RN informed Dr. Grandville Silos that pt reports having a reaction to cymbalta in the past, but pt is unable to describe his reaction as he cannot remember what it was he just remembers having a reaction. This AM dose not given.

## 2021-07-28 NOTE — TOC Initial Note (Signed)
Transition of Care [facility]) - Initial/Assessment Note    Patient Details  Name: [name] MRN: [id number] Date of Birth: 07-17-1935  Transition of Care [facility]) CM/SW Contact:    Leeroy Cha, RN Phone Number: 07/28/2021, 7:34 AM  Clinical Narrative:                  Transition of Care [facility]) Screening Note   Patient Details  Name: [name] Date of Birth: 1935/03/12   Transition of Care [facility]) CM/SW Contact:    Leeroy Cha, RN Phone Number: 07/28/2021, 7:34 AM    Transition of Care Department [facility]) has reviewed patient and no TOC needs have been identified at this time. We will continue to monitor patient advancement through interdisciplinary progression rounds. If new patient transition needs arise, please place a TOC consult.  TOC PLAN OF CARE: from home with wife has home o2 in place due to hx of copd.   Expected Discharge Plan: [place] Barriers to Discharge: Continued Medical Work up   Patient Goals and CMS Choice Patient states their goals for this hospitalization and ongoing recovery are:: to go home CMS Medicare.gov Compare Post Acute Care list provided to:: Patient Choice offered to / list presented to : [facility] POA / Guardian  Expected Discharge Plan and Services Expected Discharge Plan: [place]   Discharge Planning Services: CM Consult   Living arrangements for the past 2 months: Single Family Home                                      Prior Living Arrangements/Services Living arrangements for the past 2 months: Single Family Home Lives with:: Spouse Patient language and need for interpreter reviewed:: Yes Do you feel safe going back to the place where you live?: Yes            Criminal Activity/Legal Involvement Pertinent to Current Situation/Hospitalization: No - Comment as needed  Activities of Daily Living Home Assistive Devices/Equipment: Gilford Rile (specify type), Grab bars around  toilet, Shower chair with back, Grab bars in shower, Hand-held shower hose ADL Screening (condition at time of admission) Patient's cognitive ability adequate to safely complete daily activities?: Yes Is the patient deaf or have difficulty hearing?: No Does the patient have difficulty seeing, even when wearing glasses/contacts?: No Does the patient have difficulty concentrating, remembering, or making decisions?: No Patient able to express need for assistance with ADLs?: Yes Does the patient have difficulty dressing or bathing?: No Independently performs ADLs?: No Communication: Independent Dressing (OT): Needs assistance Is this a change from baseline?: Change from baseline, expected to last <3days Grooming: Needs assistance Is this a change from baseline?: Change from baseline, expected to last <3 days Bathing: Independent Toileting: Independent In/Out Bed: Needs assistance Is this a change from baseline?: Change from baseline, expected to last <3 days Walks in Home: Independent with device (comment), Needs assistance Is this a change from baseline?: Change from baseline, expected to last <3 days Does the patient have difficulty walking or climbing stairs?: Yes (pt has a stairlift) Weakness of Legs: Both Weakness of Arms/Hands: Both  Permission Sought/Granted                  Emotional Assessment Appearance:: Appears stated age Attitude/Demeanor/Rapport: Engaged Affect (typically observed): Calm Orientation: : Oriented to Place, Oriented to Self, Oriented to  Time, Oriented to  Situation Alcohol / Substance Use: Not Applicable Psych Involvement: No (comment)  Admission diagnosis:  Pneumonia [J18.9] Community acquired pneumonia, unspecified laterality [J18.9] Other acute pulmonary embolism, unspecified whether acute cor pulmonale present [facility]) [I26.99] Patient Active Problem List   Diagnosis Date Noted   Acute on chronic respiratory failure with hypoxia ([place]) 07/27/2021    Community acquired pneumonia    Acute pulmonary embolism (HCC)    Benign prostatic hyperplasia with nocturia 09/20/2019   Closed fracture of first lumbar vertebra ([place]) 09/20/2019   Idiopathic progressive neuropathy 09/20/2019   Other fatigue 09/20/2019   Venous insufficiency of both lower extremities 09/20/2019   Anemia 07/25/2019   Left inguinal hernia 02/07/2019   Left leg pain 05/18/2018   Pneumonia 05/18/2018   Chronic respiratory failure with hypoxia ([place]) 01/12/2015   COPD with acute exacerbation ([place]) 01/21/2013   Hyperlipidemia 12/01/2011   Hypertension 12/01/2011   CAD (coronary artery disease) 05/23/2011   Lung nodule 09/16/2008   Allergic rhinitis 07/24/2007   COPD mixed type ([place]) 07/24/2007   BURSITIS 07/24/2007   PCP:  Gayland Curry, DO Pharmacy:   [place] [facility] [id number] - [address], [place] [address] Phone: [phone] Fax: [place], [place] [address] [address] Phone: [phone] Fax: [phone]     Social Determinants of Health (SDOH) Interventions    Readmission Risk Interventions No flowsheet data found.

## 2021-07-28 NOTE — Progress Notes (Signed)
BSE completed, full report to follow. Pt failed 3 ounce Yale water screen- due to overt coughing within 10 seconds of swallowing the water, thus concerning for silent aspiration.  He also demonstrated subtle cough with small boluses of water but no cough or dyspnea with nectar, applesauce or graham cracker.  Pt reports this is his first pna - but son, Liliane Channel, raised concern for possible pnas. MBS ordered per Dr Grandville Silos, pt and son educated to process using teach back to [facility] procedure.   Kathleen Lime, MS [facility] SLP Acute Rehab Services Office [phone] Pager [phone]

## 2021-07-28 NOTE — Progress Notes (Signed)
BLE venous duplex has been completed.   Results can be found under chart review under CV PROC. 07/28/2021 11:40 AM Bette Brienza RVT, RDMS

## 2021-07-28 NOTE — Progress Notes (Signed)
OT Cancellation Note  Patient Details Name: [name] MRN: [id number] DOB: [birth year]   Cancelled Treatment:    Reason Eval/Treat Not Completed: Medical issues which prohibited therapy. Pt with new found PE, has not been on anti-coagulation for 24 hours and is sub therapeutic. Reached out to MD via chat text and he asked [place] to hold until tomorrow.  [place], OTR/L Acute Rehab Services Pager [phone] Office [phone]    Almon Register 07/28/2021, 8:56 AM

## 2021-07-28 NOTE — Progress Notes (Signed)
This RN at bedside with patient and his son. Pt requesting this RN to let him sleep and not wake him up until 530pm as pt has only had 1 hour of sleep since last night and reports a "busy day today." Pt has personal items within reach, as well as call bell. Pt's bed alarm is on. RN will continue to monitor.

## 2021-07-28 NOTE — Progress Notes (Signed)
Pharmacy: Re- heparin   Patient is an 86 y.o M presented to the ED on 07/27/21 with c/o cough and SOB.  Chest CT showed "Small peripheral pulmonary embolus is noted in upper lobe branch of left pulmonary artery."  LE doppler is negative for DVT.  He's currently on heparin drip for acute PE.  - confirmatory heparin  level collected at  1657 remains therapeutic at 0.46   Goal of Therapy:  Heparin level 0.3-0.7 units/ml Monitor platelets by anticoagulation protocol: Yes  Plan: - continue heparin drip at 1300 units/hr - daily heparin level - monitor for s/sx bleeding  Dia Sitter, PharmD, BCPS 07/28/2021 5:52 PM

## 2021-07-28 NOTE — Progress Notes (Signed)
Objective Swallowing Evaluation: Type of Study: MBS-Modified Barium Swallow Study   Patient Details  Name: [name] MRN: [id number] Date of Birth: 05/13/1935  Today's Date: 07/28/2021 Time: SLP Start Time (ACUTE ONLY): 4098 -SLP Stop Time (ACUTE ONLY): 1191  SLP Time Calculation (min) (ACUTE ONLY): 30 min   Past Medical History:  Past Medical History:  Diagnosis Date   Allergic rhinitis, cause unspecified    Arthritis    COPD (chronic obstructive pulmonary disease) ([place])    Dyspnea    Dysrhythmia    "1 episode of tachycardia in 1980's, been on it ever since"   Emphysema of lung ([place])    Hypertension    Left inguinal hernia 02/07/2019   Low hemoglobin    low level   Neuromuscular disorder ([place])    Neuropathy    feet   Pneumonia    History of, last Oct 2019   Pulmonary nodule    Past Surgical History:  Past Surgical History:  Procedure Laterality Date   [place]  2003   performed at [facility], Dr. Fransico Him   CATARACT EXTRACTION  2008   Dr. Darleen Crocker   CATARACT EXTRACTION, BILATERAL     EYE SURGERY     INGUINAL HERNIA REPAIR Left 02/07/2019   Procedure: [place];  Surgeon: Fanny Skates, MD;  Location: [place];  Service: General;  Laterality: Left;  SPINAL AND TAP BLOCK ANESTHESIA   ROTATOR CUFF REPAIR  2002   Dr. Kathryne Hitch   TONSILLECTOMY  1945   VASECTOMY     HPI: Pt is a n 86 yo male adm to hospital - with respiratory issues - diagnosed with pna.  Pt reports being recently treated with ABX, steroid approx 4 weeks ago and more recently by Dr. Keturah Barre.  Large airspace opacity is noted in right upper and lower lobes concerning for pneumonia, small sliding hiatal hernia  No prior swallow evals.  Pt admits to having problems with "feeling full" pointing to proximal esophagus and having "gas".  He denies dysphagia.  Pt failed [facility] swallow screen and MBS ordered.   Subjective: pt awake in  chair    Recommendations for follow up therapy are one component of a multi-disciplinary discharge planning process, led by the attending physician.  Recommendations may be updated based on patient status, additional functional criteria and insurance authorization.  Assessment / Plan / Recommendation  Clinical Impressions 07/28/2021  Clinical Impression Patient presents with mild dysphagia with suspected oral compensation due to his COPD c/b lingual pumping, prolonged oral transiting with lingual pumping with liquids. Oral hold may aid voluntary swallow control and increases time for breathing, thus may improve breathing and increase post-swallow exhalation.    Pharyngeal swallow is overall strong and timely with initial laryngeal penetration of thin via cup *with first swallow of the study*.   Aspiration of lateral channels secretion and barium retention observed (suspect of nectar liquids pt previously had swallowed) as retention spilled into open larynx while pudding barium was at vallecular space.  This did not recur across all further boluses.  Suspect pt has level of chronic low grade aspiration for which mitigation strategies indicated. In addition, pt appears with prominent CP/UES - developing CP bar causing SLP to suspect excessive pressure in the esophagus.  Upon esophageal sweep, pt appeared with barium lining his esophagus.    Recommend pt follow strict esophageal/reflux precautions.  In addition, pt required frequent rest breaks during  exam  and thus swallow/respiratory dysynchrony may allow episodic  aspiration.  Using teach back, educated son, [name], to findings/recommendations using video loops. Pt had been taken upstairs.    Will follow up briefly and  consider RMST if pt will participate.  SLP Visit Diagnosis Dysphagia, unspecified (R13.10);Dysphagia, oral phase (R13.11)  Attention and concentration deficit following --  Frontal lobe and executive function deficit following --   Impact on safety and function Mild aspiration risk      Treatment Recommendations 07/28/2021  Treatment Recommendations Therapy as outlined in treatment plan below     Prognosis 07/28/2021  Prognosis for Safe Diet Advancement Fair  Barriers to Reach Goals Time post onset  Barriers/Prognosis Comment --    Diet Recommendations 07/28/2021  SLP Diet Recommendations Dysphagia 3 (Mech soft) solids;Thin liquid  Liquid Administration via Cup  Medication Administration Other (Comment)  Compensations Slow rate;Small sips/bites  Postural Changes Remain semi-upright after after feeds/meals (Comment);Seated upright at 90 degrees      Other Recommendations 07/28/2021  Recommended Consults --  Oral Care Recommendations Oral care QID  Other Recommendations Have oral suction available  Follow Up Recommendations --  Assistance recommended at discharge Intermittent Supervision/Assistance  Functional Status Assessment Patient has had a recent decline in their functional status and demonstrates the ability to make significant improvements in function in a reasonable and predictable amount of time.    Frequency and Duration  07/28/2021  Speech Therapy Frequency (ACUTE ONLY) min 1 x/week  Treatment Duration 2 weeks      Oral Phase 07/28/2021  Oral Phase Impaired  Oral - Pudding Teaspoon --  Oral - Pudding Cup --  Oral - Honey Teaspoon --  Oral - Honey Cup --  Oral - Nectar Teaspoon --  Oral - Nectar Cup Lingual pumping;Delayed oral transit;Premature spillage  Oral - Nectar Straw --  Oral - Thin Teaspoon --  Oral - Thin Cup Delayed oral transit;Lingual pumping  Oral - Thin Straw Lingual pumping;Delayed oral transit  Oral - Puree Lingual pumping;Delayed oral transit  Oral - Mech Soft Lingual pumping;Delayed oral transit  Oral - Regular --  Oral - Multi-Consistency --  Oral - Pill Lingual pumping;Reduced posterior propulsion  Oral Phase - Comment Pt    Pharyngeal Phase 07/28/2021   Pharyngeal Phase Impaired  Pharyngeal- Pudding Teaspoon --  Pharyngeal --  Pharyngeal- Pudding Cup --  Pharyngeal --  Pharyngeal- Honey Teaspoon --  Pharyngeal --  Pharyngeal- Honey Cup --  Pharyngeal --  Pharyngeal- Nectar Teaspoon --  Pharyngeal --  Pharyngeal- Nectar Cup [facility]  Pharyngeal Material does not enter airway  Pharyngeal- Nectar Straw --  Pharyngeal --  Pharyngeal- Thin Teaspoon --  Pharyngeal --  Pharyngeal- Thin Cup WFL;Penetration/Aspiration during swallow  Pharyngeal Material does not enter airway  Pharyngeal- Thin Straw WFL  Pharyngeal Material does not enter airway  Pharyngeal- Puree WFL  Pharyngeal Material does not enter airway  Pharyngeal- Mechanical Soft WFL  Pharyngeal Material does not enter airway  Pharyngeal- Regular --  Pharyngeal --  Pharyngeal- Multi-consistency --  Pharyngeal --  Pharyngeal- Pill Reduced epiglottic inversion;Pharyngeal residue - valleculae  Pharyngeal Material does not enter airway  Pharyngeal Comment barium tablet stalled at vallecular region when taken with thin with pt awareness, pudding transited it into cervical esophagus - where it appeared to halt above UES - more liquids facilitated clearance into esophagus, prolonged oral transiting is self created compensations strategy likely due to pt's COPD     Cervical Esophageal Phase  07/28/2021  Cervical  Esophageal Phase Impaired  Pudding Teaspoon --  Pudding Cup --  Honey Teaspoon --  Honey Cup --  Nectar Teaspoon --  Nectar Cup Prominent cricopharyngeal segment  Nectar Straw --  Thin Teaspoon --  Thin Cup Prominent cricopharyngeal segment  Thin Straw Prominent cricopharyngeal segment  Puree Prominent cricopharyngeal segment  Mechanical Soft Prominent cricopharyngeal segment  Regular Reduced cricopharyngeal relaxation;Prominent cricopharyngeal segment  Multi-consistency --  Pill --  Cervical Esophageal Comment --     Macario Golds 07/28/2021, 4:18 PM    Kathleen Lime, MS [facility] SLP Acute Rehab Services Office [phone] Pager [phone]

## 2021-07-28 NOTE — Progress Notes (Signed)
PROGRESS NOTE    [name]  SWF:[id number] DOB: 03-16-1935 DOA: 07/27/2021 PCP: Gayland Curry, DO    Chief Complaint  Patient presents with   Cough    Brief Narrative: Patient 86 year old gentleman history of chronic respiratory failure secondary to COPD on 3 L nasal cannula baseline at rest and at bedtime and 4 to 6 L on exertion, hypertension hyperlipidemia CAD, BPH presented with a 1 month history of worsening shortness of breath, cough and wheezing.  Patient received 2 rounds of antibiotics in the outpatient setting with no significant clinical improvement.  CT chest done on admission concerning for small peripheral pulmonary embolus in the upper lobe of the left pulmonary artery, large airspace opacity involving the right upper and lower lungs concerning for pneumonia.  Also concern for acute COPD exacerbation.  Patient admitted, placed empirically on IV antibiotics, IV heparin, nebulizer treatments.   Assessment & Plan:   Principal Problem:   Acute on chronic respiratory failure with hypoxia (HCC) Active Problems:   Pneumonia   COPD with acute exacerbation (HCC)   Allergic rhinitis   COPD mixed type (HCC)   CAD (coronary artery disease)   Hyperlipidemia   Hypertension   Benign prostatic hyperplasia with nocturia   Venous insufficiency of both lower extremities   Community acquired pneumonia   Acute pulmonary embolism (HCC)  #1 acute on chronic respiratory failure with hypoxia secondary to multifocal right-sided pneumonia and pulmonary emboli and probable COPD exacerbation. -Patient presented with worsening shortness of breath, diffuse wheezing, productive cough which have been ongoing for the past month with no significant improvement after 2 rounds of antibiotics and prednisone. -Chest x-ray done on presentation with concerns of a right upper lobe pneumonia.  Patient also noted to have a leukocytosis with a left shift. -CT angiogram chest done with concerning for  small peripheral left upper lobe PE, large airspace opacity in the right upper and lower lobes concerning for pneumonia. -Sputum gram stain and culture pending.  Urine Legionella antigen pending.  Urine pneumococcus antigen negative.  MRSA PCR negative.  Blood cultures pending.   -Discontinue IV vancomycin. -Continue IV cefepime and IV azithromycin.   -Continue neb treatment, Mucinex, mucolytic's, IV heparin, IV Solu-Medrol.   -Supportive care.    2.  Multifocal right-sided pneumonia -Noted on chest x-ray and CT angiogram chest. -Sputum gram stain and cultures pending.   -Blood cultures pending.  -Leukocytosis trending down. -Urine strep pneumococcus antigen negative.  -Urine Legionella antigen pending.  -MRSA PCR negative.  -Discontinue IV vancomycin.  -Continue IV cefepime and IV azithromycin.  -SLP following and patient for MBS.  -Continue current dysphagia 3 diet.  -Supportive care.    3.  Pulmonary emboli -Noted on CT angiogram chest. -Patient with no prior history of DVT or PE, no recent surgeries, no recent long car rides or travel. -2D echo with no right ventricular strain.   -Lower extremity Dopplers negative for DVT.   -Continue empiric IV heparin for another 24 hours and could likely transition to oral anticoagulation.  4.  Acute COPD exacerbation -Likely exacerbated by multifocal right-sided pneumonia. -Patient still with some expiratory wheezing.  Continue scheduled Xopenex, Atrovent nebs, Pulmicort, Claritin, Flonase, PPI, empiric IV antibiotics.   -Solu-Medrol 60 mg IV every 12 hours.   -Supportive care.    5.  Hypertension -Cozaar.    6.  Hyperlipidemia -Statin.  7.  BPH -Continue Flomax.      DVT prophylaxis: Heparin Code Status: DNR Family Communication: Updated patient.  No  family at bedside. Disposition:   Status is: Inpatient  Remains inpatient appropriate because: Severity of illness       Consultants:  None  Procedures:  CT  angiogram chest 07/27/2021 Chest x-ray 07/27/2021 Lower extremity Dopplers 07/28/2021 2D echo 07/28/2021   Antimicrobials:  IV azithromycin 07/27/2021>>>> 08/02/2021 IV cefepime 07/27/2021>>>>> IV vancomycin 07/27/2021>>>> 07/28/2021   Subjective: Patient sitting up in chair.  States has not noticed any significant improvement with shortness of breath.  No chest pain.  No abdominal pain.  Objective: Vitals:   07/27/21 2336 07/28/21 0338 07/28/21 0345 07/28/21 0805  BP: (!) 152/77 (!) 155/81    Pulse: 89 87    Resp: 20 20    Temp: 98.2 F (36.8 C) 98.2 F (36.8 C)    TempSrc: Oral Oral    SpO2: 96% 95%  96%  Weight:   60.6 kg   Height:        Intake/Output Summary (Last 24 hours) at 07/28/2021 1136 Last data filed at 07/28/2021 3532 Gross per 24 hour  Intake 1180.22 ml  Output 800 ml  Net 380.22 ml   Filed Weights   07/27/21 2108 07/28/21 0345  Weight: 57.2 kg 60.6 kg    Examination:  General exam: NAD. Respiratory system: Expiratory wheezing.  Fair air movement.  No crackles.  Some scattered coarse breath sounds on the right.  Normal respiratory effort.   Cardiovascular system: S1 & S2 heard, RRR. No JVD, murmurs, rubs, gallops or clicks. No pedal edema. Gastrointestinal system: Abdomen is nondistended, soft and nontender. No organomegaly or masses felt. Normal bowel sounds heard. Central nervous system: Alert and oriented. No focal neurological deficits. Extremities: 1+ left lower extremity edema. Skin: No rashes, lesions or ulcers Psychiatry: Judgement and insight appear normal. Mood & affect appropriate.     Data Reviewed: I have personally reviewed following labs and imaging studies  CBC: Recent Labs  Lab 07/27/21 1216 07/28/21 0354  WBC 13.3* 11.2*  NEUTROABS 11.8* 10.2*  HGB 11.9* 10.6*  HCT 37.7* 32.5*  MCV 88.3 87.4  PLT 354 992    Basic Metabolic Panel: Recent Labs  Lab 07/27/21 1216 07/28/21 0354  NA 135 134*  K 4.5 4.3  CL 95* 99   CO2 34* 28  GLUCOSE 139* 151*  BUN 14 17  CREATININE 0.44* 0.49*  CALCIUM 9.1 8.0*  MG  --  1.9  PHOS  --  3.5    GFR: Estimated Creatinine Clearance: 58.9 mL/min (A) (by C-G formula based on SCr of 0.49 mg/dL (L)).  Liver Function Tests: Recent Labs  Lab 07/28/21 0354  AST 18  ALT 23  ALKPHOS 43  BILITOT 0.5  PROT 5.2*  ALBUMIN 2.1*    CBG: Recent Labs  Lab 07/28/21 0725  GLUCAP 130*     Recent Results (from the past 240 hour(s))  Blood culture (routine x 2)     Status: None (Preliminary result)   Collection Time: 07/27/21 12:00 PM   Specimen: BLOOD LEFT ARM  Result Value Ref Range Status   Specimen Description   Final    BLOOD LEFT ARM Performed at [facility], [address], [place], [place] [address]    Special Requests   Final    BOTTLES DRAWN AEROBIC AND ANAEROBIC Blood Culture results may not be optimal due to an excessive volume of blood received in culture bottles   Culture   Final    NO GROWTH < 24 HOURS Performed at [facility]  [address] [place], [place] [address]    Report Status PENDING  Incomplete  Resp Panel by RT-PCR (Flu A&B, Covid) Nasopharyngeal Swab     Status: None   Collection Time: 07/27/21 12:16 PM   Specimen: Nasopharyngeal Swab; Nasopharyngeal(NP) swabs in vial transport medium  Result Value Ref Range Status   SARS Coronavirus 2 by RT PCR NEGATIVE NEGATIVE Final    Comment: (NOTE) SARS-CoV-2 target nucleic acids are NOT DETECTED.  The SARS-CoV-2 RNA is generally detectable in upper respiratory specimens during the acute phase of infection. The lowest concentration of SARS-CoV-2 viral copies this assay can detect is 138 copies/mL. A negative result does not preclude SARS-Cov-2 infection and should not be used as the sole basis for treatment or other patient management decisions. A negative result may occur with  improper specimen collection/handling, submission of specimen other than  nasopharyngeal swab, presence of viral mutation(s) within the areas targeted by this assay, and inadequate number of viral copies(<138 copies/mL). A negative result must be combined with clinical observations, patient history, and epidemiological information. The expected result is Negative.  Fact Sheet for Patients:  EntrepreneurPulse.com.au  Fact Sheet for Healthcare Providers:  IncredibleEmployment.be  This test is no t yet approved or cleared by the [place] FDA and  has been authorized for detection and/or diagnosis of SARS-CoV-2 by FDA under an Emergency Use Authorization (EUA). This EUA will remain  in effect (meaning this test can be used) for the duration of the COVID-19 declaration under Section 564(b)(1) of the Act, 21 U.S.C.section 360bbb-3(b)(1), unless the authorization is terminated  or revoked sooner.       Influenza A by PCR NEGATIVE NEGATIVE Final   Influenza B by PCR NEGATIVE NEGATIVE Final    Comment: (NOTE) The Xpert Xpress SARS-CoV-2/FLU/RSV plus assay is intended as an aid in the diagnosis of influenza from Nasopharyngeal swab specimens and should not be used as a sole basis for treatment. Nasal washings and aspirates are unacceptable for Xpert Xpress SARS-CoV-2/FLU/RSV testing.  Fact Sheet for Patients: EntrepreneurPulse.com.au  Fact Sheet for Healthcare Providers: IncredibleEmployment.be  This test is not yet approved or cleared by the [place] FDA and has been authorized for detection and/or diagnosis of SARS-CoV-2 by FDA under an Emergency Use Authorization (EUA). This EUA will remain in effect (meaning this test can be used) for the duration of the COVID-19 declaration under Section 564(b)(1) of the Act, 21 U.S.C. section 360bbb-3(b)(1), unless the authorization is terminated or revoked.  Performed at [facility], [address] [place], [place] [address]   Blood culture (routine x 2)     Status: None (Preliminary result)   Collection Time: 07/27/21  2:16 PM   Specimen: BLOOD RIGHT WRIST  Result Value Ref Range Status   Specimen Description   Final    BLOOD RIGHT WRIST Performed at [facility], [address] [place], [place] [address]    Special Requests   Final    BOTTLES DRAWN AEROBIC AND ANAEROBIC Blood Culture adequate volume   Culture   Final    NO GROWTH < 24 HOURS Performed at [facility] [address], [place],  [address]    Report Status PENDING  Incomplete  MRSA Next Gen by PCR, Nasal     Status: None   Collection Time: 07/27/21  6:53 PM   Specimen: Nasal Mucosa; Nasal Swab  Result Value Ref Range Status   MRSA by PCR Next Gen NOT DETECTED NOT DETECTED Final  Comment: (NOTE) The GeneXpert MRSA Assay (FDA approved for NASAL specimens only), is one component of a comprehensive MRSA colonization surveillance program. It is not intended to diagnose MRSA infection nor to guide or monitor treatment for MRSA infections. Test performance is not FDA approved in patients less than 20 years old. Performed at [facility], [place] [address], [place], [place] [address]          Radiology Studies: CT Angio Chest PE W and/or Wo Contrast  Result Date: 07/27/2021 CLINICAL DATA:  Pneumonia, hypoxia. EXAM: CT ANGIOGRAPHY CHEST WITH CONTRAST TECHNIQUE: Multidetector CT imaging of the chest was performed using the standard protocol during bolus administration of intravenous contrast. Multiplanar CT image reconstructions and MIPs were obtained to evaluate the vascular anatomy. CONTRAST:  29mL OMNIPAQUE IOHEXOL 350 MG/ML SOLN COMPARISON:  February 28, 2019. FINDINGS: Cardiovascular: Small filling defect is seen in a peripheral branch of the left pulmonary artery in lingular segment of left upper lobe. Atherosclerosis of thoracic aorta is noted without  aneurysm or dissection. Normal cardiac size. No pericardial effusion. Mediastinum/Nodes: Small sliding-type hiatal hernia is noted. No adenopathy is noted. Thyroid gland is unremarkable. Lungs/Pleura: No pneumothorax or pleural effusion is noted. Emphysematous disease is noted. Right upper and lower lobe airspace opacities are noted concerning for pneumonia. Upper Abdomen: No acute abnormality. Musculoskeletal: No chest wall abnormality. No acute or significant osseous findings. Review of the MIP images confirms the above findings. IMPRESSION: Small peripheral pulmonary embolus is noted in upper lobe branch of left pulmonary artery. Critical Value/emergent results were called by telephone at the time of interpretation on 07/27/2021 at 1:58 pm to provider ADAM CURATOLO , who verbally acknowledged these results. Large airspace opacity is noted in right upper and lower lobes concerning for pneumonia. Small sliding-type hiatal hernia. Aortic Atherosclerosis (ICD10-I70.0) and Emphysema (ICD10-J43.9). Electronically Signed   By: Marijo Conception M.D.   On: 07/27/2021 13:58   DG Chest Portable 1 View  Result Date: 07/27/2021 CLINICAL DATA:  Recent diagnosis of pneumonia with persistent dyspnea and weakness and cough EXAM: PORTABLE CHEST 1 VIEW COMPARISON:  07/21/2021 chest radiograph. FINDINGS: Stable cardiomediastinal silhouette with normal heart size. No pneumothorax. No pleural effusion. Hyperinflated lungs. Emphysema. Worsening patchy consolidation in the upper right lung. Clear left lung. IMPRESSION: 1. Worsening patchy consolidation in the upper right lung. While potentially due to worsening pneumonia, an underlying central upper right lung mass is not excluded. Suggest further evaluation with chest CT with IV contrast. 2. Hyperinflated lungs and emphysema, suggesting COPD. Electronically Signed   By: Ilona Sorrel M.D.   On: 07/27/2021 12:32        Scheduled Meds:  budesonide (PULMICORT) nebulizer solution   0.5 mg Nebulization BID   fluticasone  2 spray Each Nare Daily   guaiFENesin  1,200 mg Oral BID   ipratropium  0.5 mg Nebulization BID   levalbuterol  0.63 mg Nebulization BID   loratadine  10 mg Oral Daily   losartan  25 mg Oral Daily   methylPREDNISolone (SOLU-MEDROL) injection  60 mg Intravenous Q12H   pantoprazole  40 mg Oral Q0600   polyethylene glycol  17 g Oral Daily   pravastatin  40 mg Oral Daily   Ensure Max Protein  2 oz Oral Daily   senna  1 tablet Oral BID   sodium chloride flush  3 mL Intravenous Q12H   tamsulosin  0.4 mg Oral Daily   vitamin B-12  1,000 mcg Oral Daily   Continuous Infusions:  sodium chloride Stopped (07/27/21 1529)   sodium chloride Stopped (07/27/21 1440)   sodium chloride 75 mL/hr at 07/28/21 1025   azithromycin     ceFEPime (MAXIPIME) IV 2 g (07/28/21 0345)   heparin 1,300 Units/hr (07/28/21 0741)     LOS: 1 day    Time spent: 40 minutes    Irine Seal, MD [facility]   To contact the attending provider between 7A-7P or the covering provider during after hours 7P-7A, please log into the web site [email or web address] and access using universal [place] password for that web site. If you do not have the password, please call the hospital operator.  07/28/2021, 11:36 AM

## 2021-07-28 NOTE — Progress Notes (Signed)
PT Cancellation Note  Patient Details Name: [name] MRN: [id number] DOB: [birth year]   Cancelled Treatment:    Reason Eval/Treat Not Completed: Medical issues which prohibited therapy. Will hold PT today and check back on tomorrow. See OT note. Thanks.    [facility]  Office: [phone] Pager: [phone]

## 2021-07-28 NOTE — Evaluation (Signed)
Clinical/Bedside Swallow Evaluation Patient Details  Name: [name] MRN: [id number] Date of Birth: 1935/03/08  Today's Date: 07/28/2021 Time: SLP Start Time (ACUTE ONLY): 72 SLP Stop Time (ACUTE ONLY): 2876 SLP Time Calculation (min) (ACUTE ONLY): 25 min  Past Medical History:  Past Medical History:  Diagnosis Date   Allergic rhinitis, cause unspecified    Arthritis    COPD (chronic obstructive pulmonary disease) ([place])    Dyspnea    Dysrhythmia    "1 episode of tachycardia in 1980's, been on it ever since"   Emphysema of lung ([place])    Hypertension    Left inguinal hernia 02/07/2019   Low hemoglobin    low level   Neuromuscular disorder ([place])    Neuropathy    feet   Pneumonia    History of, last Oct 2019   Pulmonary nodule    Past Surgical History:  Past Surgical History:  Procedure Laterality Date   [place]  2003   performed at [facility], Dr. Fransico Him   CATARACT EXTRACTION  2008   Dr. Darleen Crocker   CATARACT EXTRACTION, BILATERAL     EYE SURGERY     INGUINAL HERNIA REPAIR Left 02/07/2019   Procedure: [place];  Surgeon: Fanny Skates, MD;  Location: [facility];  Service: General;  Laterality: Left;  SPINAL AND TAP BLOCK ANESTHESIA   ROTATOR CUFF REPAIR  2002   Dr. Kathryne Hitch   TONSILLECTOMY  1945   VASECTOMY     HPI:  Dr. Keturah Barre, tx pt for pneumonia six days PTA. shortness of breath, diffuse wheezing cough, sore throat. Large airspace opacity is noted in right upper and lower lobes concerning for pneumonia, small sliding hiatal hernia  No prior swallow evals    Assessment / Plan / Recommendation  Clinical Impression  Pt failed 3 ounce [place] water screen- due to overt coughing within 10 seconds of swallowing the water, thus concerning for silent aspiration.  He also demonstrated subtle cough with small boluses of water but no cough or dyspnea with nectar, applesauce or graham cracker.   Pt reports this is his first pna - but son, [name], raised concern for possible pnas. MBS ordered per Dr Grandville Silos, pt and son educated to process using teach back to [facility] procedure. SLP Visit Diagnosis: Dysphagia, unspecified (R13.10)    Aspiration Risk  Mild aspiration risk    Diet Recommendation Regular;Thin liquid   Liquid Administration via: Cup;Straw Medication Administration: Whole meds with liquid Supervision: Patient able to self feed Compensations: Slow rate;Small sips/bites Postural Changes: Seated upright at 90 degrees;Remain upright for at least 30 minutes after po intake    Other  Recommendations Oral Care Recommendations: Oral care BID    Recommendations for follow up therapy are one component of a multi-disciplinary discharge planning process, led by the attending physician.  Recommendations may be updated based on patient status, additional functional criteria and insurance authorization.  Follow up Recommendations Follow physician's recommendations for discharge plan and follow up therapies (tbd)      Assistance Recommended at Discharge Intermittent Supervision/Assistance  Functional Status Assessment Patient has had a recent decline in their functional status and demonstrates the ability to make significant improvements in function in a reasonable and predictable amount of time.  Frequency and Duration min 1 x/week  1 week       Prognosis Prognosis for Safe Diet Advancement: Fair Barriers to Reach Goals: Time post onset  Swallow Study   General Date of Onset: 07/28/21 HPI: Dr. Keturah Barre, tx pt for pneumonia six days PTA. shortness of breath, diffuse wheezing cough, sore throat. Large airspace opacity is noted in right upper and lower lobes concerning for pneumonia, small sliding hiatal hernia  No prior swallow evals Type of Study: Bedside Swallow Evaluation Diet Prior to this Study: Regular;Thin liquids Temperature Spikes Noted: No Respiratory Status: Room  air History of Recent Intubation: No Behavior/Cognition: Alert;Cooperative;Pleasant mood Oral Cavity Assessment: Within Functional Limits Oral Care Completed by SLP: No Oral Cavity - Dentition: Adequate natural dentition Vision: Functional for self-feeding Self-Feeding Abilities: Able to feed self Patient Positioning: Upright in chair Baseline Vocal Quality: Normal Volitional Cough: Strong Volitional Swallow: Able to elicit    Oral/Motor/Sensory Function Overall Oral Motor/Sensory Function: Within functional limits   Ice Chips Ice chips: Not tested   Thin Liquid Thin Liquid: Impaired Presentation: Cup;Self Fed;Straw Pharyngeal  Phase Impairments: Cough - Immediate;Cough - Delayed    Nectar Thick Nectar Thick Liquid: Not tested   Honey Thick     Puree Puree: Within functional limits Presentation: Self Fed;Spoon   Solid     Solid: Within functional limits Presentation: Self Fredirick Lathe 07/28/2021,3:23 PM   Kathleen Lime, MS [facility] SLP [place] Office [phone] Pager [phone]

## 2021-07-28 NOTE — Progress Notes (Signed)
Heparin bolus 2000 unit given @ 0040 and  dose was increased  from 11 ml/hr to 13 ml/hr @0042  per order [facility], RN 07/28/2021 6:24 AM

## 2021-07-29 DIAGNOSIS — J9621 Acute and chronic respiratory failure with hypoxia: Secondary | ICD-10-CM

## 2021-07-29 LAB — MAGNESIUM: Magnesium: 2.1 mg/dL (ref 1.7–2.4)

## 2021-07-29 LAB — CBC
HCT: 31 % — ABNORMAL LOW (ref 39.0–52.0)
Hemoglobin: 9.8 g/dL — ABNORMAL LOW (ref 13.0–17.0)
MCH: 28.2 pg (ref 26.0–34.0)
MCHC: 31.6 g/dL (ref 30.0–36.0)
MCV: 89.1 fL (ref 80.0–100.0)
Platelets: 325 10*3/uL (ref 150–400)
RBC: 3.48 MIL/uL — ABNORMAL LOW (ref 4.22–5.81)
RDW: 13.2 % (ref 11.5–15.5)
WBC: 13.6 10*3/uL — ABNORMAL HIGH (ref 4.0–10.5)
nRBC: 0 % (ref 0.0–0.2)

## 2021-07-29 LAB — URINE CULTURE: Culture: NO GROWTH

## 2021-07-29 LAB — BASIC METABOLIC PANEL
Anion gap: 6 (ref 5–15)
BUN: 21 mg/dL (ref 8–23)
CO2: 27 mmol/L (ref 22–32)
Calcium: 8.1 mg/dL — ABNORMAL LOW (ref 8.9–10.3)
Chloride: 103 mmol/L (ref 98–111)
Creatinine, Ser: 0.44 mg/dL — ABNORMAL LOW (ref 0.61–1.24)
GFR, Estimated: >60 mL/min (ref >60)
Glucose, Bld: 161 mg/dL — ABNORMAL HIGH (ref 70–99)
Potassium: 4.3 mmol/L (ref 3.5–5.1)
Sodium: 136 mmol/L (ref 135–145)

## 2021-07-29 LAB — HEPARIN LEVEL (UNFRACTIONATED)
Heparin Unfractionated: 0.74 IU/mL — ABNORMAL HIGH (ref 0.30–0.70)
Heparin Unfractionated: 0.81 IU/mL — ABNORMAL HIGH (ref 0.30–0.70)

## 2021-07-29 LAB — GLUCOSE, CAPILLARY
Glucose-Capillary: 148 mg/dL — ABNORMAL HIGH (ref 70–99)
Glucose-Capillary: 139 mg/dL — ABNORMAL HIGH (ref 70–99)

## 2021-07-29 LAB — LEGIONELLA PNEUMOPHILA SEROGP 1 UR AG: L. pneumophila Serogp 1 Ur Ag: NEGATIVE

## 2021-07-29 MED ORDER — APIXABAN 5 MG PO TABS
10.0000 mg | ORAL_TABLET | Freq: Two times a day (BID) | ORAL | Status: DC
  Administered 2021-07-29 – 2021-07-30 (×2): 10 mg via ORAL
  Filled 2021-07-29 (×2): qty 2

## 2021-07-29 MED ORDER — APIXABAN 5 MG PO TABS: 5.0000 mg | ORAL_TABLET | Freq: Two times a day (BID) | ORAL | Status: DC

## 2021-07-29 MED ORDER — HEPARIN (PORCINE) 25000 UT/250ML-% IV SOLN
1100.0000 [IU]/h | INTRAVENOUS | Status: AC
  Filled 2021-07-29: qty 250

## 2021-07-29 NOTE — Progress Notes (Signed)
ANTICOAGULATION CONSULT NOTE - Follow Up Pharmacy Consult for heparin Indication: pulmonary embolus  Allergies  Allergen Reactions   Tolectin [Tolmetin Sodium] Other (See Comments)    BP low and passed out   Montelukast Sodium Other (See Comments)    Unknown reaction   Nsaids Other (See Comments)    MD told pt not to use   Augmentin [Amoxicillin-Pot Clavulanate] Nausea And Vomiting    Did it involve swelling of the face/tongue/throat, SOB, or low BP? No Did it involve sudden or severe rash/hives, skin peeling, or any reaction on the inside of your mouth or nose? No Did you need to seek medical attention at a hospital or doctor's office? No When did it last happen? Last year   If all above answers are "NO", may proceed with cephalosporin use.   Neosporin [Bacitracin-Polymyxin B] Rash    Patient Measurements: Height: 5\' 9"  (175.3 cm) Weight: 60.1 kg (132 lb 9.6 oz) IBW/kg (Calculated) : 70.7 Heparin Dosing Weight: n/a. Use TBW of 60 kg  Vital Signs: Temp: 98 F (36.7 C) (12/14 0407) Temp Source: Oral (12/14 0407) BP: 157/89 (12/14 0407) Pulse Rate: 73 (12/14 0407)  Labs: Recent Labs     0000 07/27/21 1213 07/27/21 1216 07/27/21 1416 07/27/21 2303 07/28/21 0354 07/28/21 0850 07/28/21 1657 07/29/21 0348  HGB   < >  --  11.9*  --   --  10.6*  --   --  9.8*  HCT  --   --  37.7*  --   --  32.5*  --   --  31.0*  PLT  --   --  354  --   --  287  --   --  325  APTT  --  29  --   --   --   --   --   --   --   LABPROT  --  13.7  --   --   --   --   --   --   --   INR  --  1.1  --   --   --   --   --   --   --   HEPARINUNFRC  --   --   --   --    < >  --  0.57 0.46 0.74*  CREATININE  --   --  0.44*  --   --  0.49*  --   --  0.44*  TROPONINIHS  --  7  --  8  --   --   --   --   --    < > = values in this interval not displayed.     Estimated Creatinine Clearance: 58.4 mL/min (A) (by C-G formula based on SCr of 0.44 mg/dL (L)).   Medical History: Past Medical History:   Diagnosis Date   Allergic rhinitis, cause unspecified    Arthritis    COPD (chronic obstructive pulmonary disease) (H[facility])    Dyspnea    Dysrhythmia    "1 episode of tachycardia in 1980's, been on it ever since"   Emphysema of lung ([place])    Hypertension    Left inguinal hernia 02/07/2019   Low hemoglobin    low level   Neuromuscular disorder ([facility])    Neuropathy    feet   Pneumonia    History of, last Oct 2019   Pulmonary nodule     Medications:  Scheduled:   budesonide (PULMICORT) nebulizer solution  0.5 mg  Nebulization BID   fluticasone  2 spray Each Nare Daily   guaiFENesin  1,200 mg Oral BID   ipratropium  0.5 mg Nebulization BID   levalbuterol  0.63 mg Nebulization BID   loratadine  10 mg Oral Daily   losartan  25 mg Oral Daily   methylPREDNISolone (SOLU-MEDROL) injection  60 mg Intravenous Q12H   pantoprazole  40 mg Oral Q0600   polyethylene glycol  17 g Oral Daily   pravastatin  40 mg Oral Daily   Ensure Max Protein  2 oz Oral Daily   senna  1 tablet Oral BID   sodium chloride flush  3 mL Intravenous Q12H   tamsulosin  0.4 mg Oral Daily   vitamin B-12  1,000 mcg Oral Daily   Infusions:   sodium chloride Stopped (07/27/21 1529)   sodium chloride Stopped (07/27/21 1440)   sodium chloride 75 mL/hr at 07/28/21 1025   azithromycin 500 mg (07/28/21 1501)   ceFEPime (MAXIPIME) IV 2 g (07/29/21 0358)   heparin 1,300 Units/hr (07/29/21 0218)    Assessment: Patient is an 28 yoM presenting with new PE.  Pharmacy consulted to dose IV heparin.  Not on any anticoagulation PTA.   Baseline Hgb slightly low at 11.9, Plt WNL.   07/29/2021 @ 6:13 AM: HL = 0.74 is now SUPRAtherapeutic on heparin infusion of 1300 units/hr Confirmed with RN that heparin infusing at correct rate & lab was drawn appropriately. No further hemoptysis or other signs of bleeding this morning.  CBC: Hgb slightly decreased; Plt WNL.   Goal of Therapy:  Heparin level 0.3-0.7 units/ml Monitor  platelets by anticoagulation protocol: Yes   Plan:  Decrease heparin infusion to 1200 units/hr Check confirmatory 8 hour HL HL, CBC daily while on heparin infusion Monitor for signs of bleeding  [name] PharmD 07/29/2021,6:13 AM

## 2021-07-29 NOTE — Progress Notes (Signed)
ANTICOAGULATION CONSULT NOTE - Follow Up Pharmacy Consult for heparin Indication: pulmonary embolus  Allergies  Allergen Reactions   Tolectin [Tolmetin Sodium] Other (See Comments)    BP low and passed out   Montelukast Sodium Other (See Comments)    Unknown reaction   Nsaids Other (See Comments)    MD told pt not to use   Augmentin [Amoxicillin-Pot Clavulanate] Nausea And Vomiting    Did it involve swelling of the face/tongue/throat, SOB, or low BP? No Did it involve sudden or severe rash/hives, skin peeling, or any reaction on the inside of your mouth or nose? No Did you need to seek medical attention at a hospital or doctor's office? No When did it last happen? Last year   If all above answers are "NO", may proceed with cephalosporin use.   Neosporin [Bacitracin-Polymyxin B] Rash    Patient Measurements: Height: 5\' 9"  (175.3 cm) Weight: 60.1 kg (132 lb 9.6 oz) IBW/kg (Calculated) : 70.7 Heparin Dosing Weight: n/a. Use TBW of 60 kg  Vital Signs: Temp: 98 F (36.7 C) (12/14 1148) Temp Source: Oral (12/14 1148) BP: 144/77 (12/14 1148) Pulse Rate: 77 (12/14 1148)  Labs: Recent Labs     0000 07/27/21 1213 07/27/21 1216 07/27/21 1416 07/27/21 2303 07/28/21 0354 07/28/21 0850 07/28/21 1657 07/29/21 0348 07/29/21 1444  HGB   < >  --  11.9*  --   --  10.6*  --   --  9.8*  --   HCT  --   --  37.7*  --   --  32.5*  --   --  31.0*  --   PLT  --   --  354  --   --  287  --   --  325  --   APTT  --  29  --   --   --   --   --   --   --   --   LABPROT  --  13.7  --   --   --   --   --   --   --   --   INR  --  1.1  --   --   --   --   --   --   --   --   HEPARINUNFRC  --   --   --   --    < >  --    < > 0.46 0.74* 0.81*  CREATININE  --   --  0.44*  --   --  0.49*  --   --  0.44*  --   TROPONINIHS  --  7  --  8  --   --   --   --   --   --    < > = values in this interval not displayed.     Estimated Creatinine Clearance: 58.4 mL/min (A) (by C-G formula based on SCr of  0.44 mg/dL (L)).   Medical History: Past Medical History:  Diagnosis Date   Allergic rhinitis, cause unspecified    Arthritis    COPD (chronic obstructive pulmonary disease) (HCC)    Dyspnea    Dysrhythmia    "1 episode of tachycardia in 1980's, been on it ever since"   Emphysema of lung ([place])    Hypertension    Left inguinal hernia 02/07/2019   Low hemoglobin    low level   Neuromuscular disorder (HCC)    Neuropathy    feet  Pneumonia    History of, last Oct 2019   Pulmonary nodule     Medications:  Scheduled:   budesonide (PULMICORT) nebulizer solution  0.5 mg Nebulization BID   fluticasone  2 spray Each Nare Daily   guaiFENesin  1,200 mg Oral BID   ipratropium  0.5 mg Nebulization BID   levalbuterol  0.63 mg Nebulization BID   loratadine  10 mg Oral Daily   losartan  25 mg Oral Daily   methylPREDNISolone (SOLU-MEDROL) injection  60 mg Intravenous Q12H   pantoprazole  40 mg Oral Q0600   polyethylene glycol  17 g Oral Daily   pravastatin  40 mg Oral Daily   Ensure Max Protein  2 oz Oral Daily   senna  1 tablet Oral BID   sodium chloride flush  3 mL Intravenous Q12H   tamsulosin  0.4 mg Oral Daily   vitamin B-12  1,000 mcg Oral Daily   Infusions:   sodium chloride Stopped (07/27/21 1529)   sodium chloride Stopped (07/27/21 1440)   sodium chloride 75 mL/hr at 07/28/21 1025   azithromycin 500 mg (07/29/21 1504)   ceFEPime (MAXIPIME) IV 2 g (07/29/21 1151)   heparin 1,200 Units/hr (07/29/21 2130)    Assessment: Patient is an 49 yoM presenting with new PE.  Pharmacy consulted to dose IV heparin.  Not on any anticoagulation PTA.   Baseline Hgb slightly low at 11.9, Plt WNL.   07/29/2021 @ 3:17 PM: HL = 0.81 is SUPRAtherapeutic on heparin infusion of 1200 units/hr Confirmed with RN that heparin infusing at correct rate & lab was drawn appropriately. No further hemoptysis or other signs of bleeding this morning.  CBC: Hgb slightly decreased; Plt WNL.   Goal of  Therapy:  Heparin level 0.3-0.7 units/ml Monitor platelets by anticoagulation protocol: Yes   Plan:  Decrease heparin infusion to 1100 units/hr Check HL in 8 hours after rate change  HL, CBC daily while on heparin infusion Monitor for signs of bleeding    Royetta Asal, PharmD, BCPS 07/29/2021 3:17 PM

## 2021-07-29 NOTE — Progress Notes (Signed)
ANTICOAGULATION CONSULT NOTE - Follow Up Consult  Pharmacy Consult for Apixaban Indication: pulmonary embolus  Allergies  Allergen Reactions   Tolectin [Tolmetin Sodium] Other (See Comments)    BP low and passed out   Montelukast Sodium Other (See Comments)    Unknown reaction   Nsaids Other (See Comments)    MD told pt not to use   Augmentin [Amoxicillin-Pot Clavulanate] Nausea And Vomiting    Did it involve swelling of the face/tongue/throat, SOB, or low BP? No Did it involve sudden or severe rash/hives, skin peeling, or any reaction on the inside of your mouth or nose? No Did you need to seek medical attention at a hospital or doctor's office? No When did it last happen? Last year   If all above answers are "NO", may proceed with cephalosporin use.   Neosporin [Bacitracin-Polymyxin B] Rash    Patient Measurements: Height: 5\' 9"  (175.3 cm) Weight: 60.1 kg (132 lb 9.6 oz) IBW/kg (Calculated) : 70.7 Heparin Dosing Weight: TBW  Vital Signs: Temp: 98 F (36.7 C) (12/14 1148) Temp Source: Oral (12/14 1148) BP: 144/77 (12/14 1148) Pulse Rate: 77 (12/14 1148)  Labs: Recent Labs     0000 07/27/21 1213 07/27/21 1216 07/27/21 1416 07/27/21 2303 07/28/21 0354 07/28/21 0850 07/28/21 1657 07/29/21 0348 07/29/21 1444  HGB   < >  --  11.9*  --   --  10.6*  --   --  9.8*  --   HCT  --   --  37.7*  --   --  32.5*  --   --  31.0*  --   PLT  --   --  354  --   --  287  --   --  325  --   APTT  --  29  --   --   --   --   --   --   --   --   LABPROT  --  13.7  --   --   --   --   --   --   --   --   INR  --  1.1  --   --   --   --   --   --   --   --   HEPARINUNFRC  --   --   --   --    < >  --    < > 0.46 0.74* 0.81*  CREATININE  --   --  0.44*  --   --  0.49*  --   --  0.44*  --   TROPONINIHS  --  7  --  8  --   --   --   --   --   --    < > = values in this interval not displayed.    Estimated Creatinine Clearance: 58.4 mL/min (A) (by C-G formula based on SCr of 0.44  mg/dL (L)).   Medications:  Scheduled:   budesonide (PULMICORT) nebulizer solution  0.5 mg Nebulization BID   fluticasone  2 spray Each Nare Daily   guaiFENesin  1,200 mg Oral BID   ipratropium  0.5 mg Nebulization BID   levalbuterol  0.63 mg Nebulization BID   loratadine  10 mg Oral Daily   losartan  25 mg Oral Daily   methylPREDNISolone (SOLU-MEDROL) injection  60 mg Intravenous Q12H   pantoprazole  40 mg Oral Q0600   polyethylene glycol  17 g Oral Daily   pravastatin  40 mg Oral Daily   Ensure Max Protein  2 oz Oral Daily   senna  1 tablet Oral BID   sodium chloride flush  3 mL Intravenous Q12H   tamsulosin  0.4 mg Oral Daily   vitamin B-12  1,000 mcg Oral Daily   Infusions:   sodium chloride Stopped (07/27/21 1529)   sodium chloride Stopped (07/27/21 1440)   sodium chloride 75 mL/hr at 07/28/21 1025   azithromycin 500 mg (07/29/21 1504)   ceFEPime (MAXIPIME) IV 2 g (07/29/21 1151)   heparin 1,100 Units/hr (07/29/21 1528)    Assessment: Patient is an 25 yoM presenting with new PE.  Pharmacy consulted to dose IV heparin initially, now consulted for transition to apixaban.  Not on any anticoagulation PTA.    Today, 07/29/2021: Heparin level elevated this afternoon on heparin 1200 units/hr, rate decreased CBC: Hgb slightly decreased; Plt WNL.  No further hemoptysis or other signs of bleeding.     Plan:  Apixaban 10 mg PO BID x7 days, then 5 mg PO BID - Stop heparin drip at the same time that apixaban is started.   Pharmacy to provide education and discount card prior to discharge.   Gretta Arab PharmD, BCPS Clinical Pharmacist [facility] main pharmacy [phone] 07/29/2021 4:48 PM

## 2021-07-29 NOTE — Progress Notes (Signed)
SLP Cancellation Note  Patient Details Name: [name] MRN: [id number] DOB: Oct 16, 1934   Cancelled treatment:       Reason Eval/Treat Not Completed: Other (comment) (pt working with PT at this time, will continue efforts) Kathleen Lime, MS [place] Office [phone] Pager [phone]   Macario Golds 07/29/2021, 3:12 PM

## 2021-07-29 NOTE — Progress Notes (Signed)
PROGRESS NOTE    [name]  CHE:[id number] DOB: 08-Dec-1934 DOA: 07/27/2021 PCP: Gayland Curry, DO    Brief Narrative:  86 year old gentleman history of chronic respiratory failure secondary to COPD on 3 L nasal cannula baseline at rest and at bedtime and 4 to 6 L on exertion, hypertension hyperlipidemia CAD, BPH presented with a 1 month history of worsening shortness of breath, cough and wheezing.  Patient received 2 rounds of antibiotics in the outpatient setting with no significant clinical improvement.  CT chest done on admission concerning for small peripheral pulmonary embolus in the upper lobe of the left pulmonary artery, large airspace opacity involving the right upper and lower lungs concerning for pneumonia.  Also concern for acute COPD exacerbation.  Patient admitted, placed empirically on IV antibiotics, IV heparin, nebulizer treatments.  Assessment & Plan:   Principal Problem:   Acute on chronic respiratory failure with hypoxia (HCC) Active Problems:   Allergic rhinitis   COPD mixed type (HCC)   CAD (coronary artery disease)   Hyperlipidemia   Hypertension   COPD with acute exacerbation (HCC)   Pneumonia   Benign prostatic hyperplasia with nocturia   Venous insufficiency of both lower extremities   Community acquired pneumonia   Acute pulmonary embolism ([place])  #1 acute on chronic respiratory failure with hypoxia secondary to multifocal right-sided pneumonia and pulmonary emboli and probable COPD exacerbation. -Patient presented with worsening shortness of breath, diffuse wheezing, productive cough which have been ongoing for the past month with no significant improvement after 2 rounds of antibiotics and prednisone. -Chest x-ray done on presentation with concerns of a right upper lobe pneumonia.  Patient also noted to have a leukocytosis with a left shift. -CT angiogram chest done with concerning for small peripheral left upper lobe PE, large airspace opacity in  the right upper and lower lobes concerning for pneumonia. -Urine Legionella antigen pending.  Urine pneumococcus antigen negative.  MRSA PCR negative.  Blood cultures pending.   -Discontinued IV vancomycin. -Continued IV cefepime and IV azithromycin.   -Continue neb treatment, Mucinex, mucolytic's, Solu-Medrol.   -Supportive care.    2.  Multifocal right-sided pneumonia -Noted on chest x-ray and CT angiogram chest. -Sputum gram stain and cultures pending.   -Blood cultures neg -Urine strep pneumococcus antigen negative.  -Urine Legionella antigen pending.  -MRSA PCR negative.  -Discontinue IV vancomycin.  -Continue IV cefepime and IV azithromycin.  -SLP following and patient for MBS.  -Continue current dysphagia 3 diet.  -Supportive care.    3.  Pulmonary emboli -Noted on CT angiogram chest. -Patient with no prior history of DVT or PE, no recent surgeries, no recent long car rides or travel. -2D echo with no right ventricular strain.   -Lower extremity Dopplers negative for DVT.   -We will transition off heparin today, start Eliquis  4.  Acute COPD exacerbation -Likely exacerbated by multifocal right-sided pneumonia. -Continue with scheduled Xopenex, Atrovent nebs, Pulmicort, Claritin, Flonase, PPI, empiric IV antibiotics.   -Solu-Medrol 60 mg IV every 12 hours, likely wean steroids soon -Patient currently on baseline 3 L nasal cannula.  Per patient and family, patient's baseline is 3 L, occasionally increasing as high as 6 L on exertion.  5.  Hypertension -Cozaar.   -stable  6.  Hyperlipidemia -Statin.  7.  BPH -Continue Flomax.    DVT prophylaxis: Eliquis Code Status: DNR Family Communication: Pt in room, family at bedside  Status is: Inpatient  Remains inpatient appropriate because: Severity of illness  Consultants:    Procedures:    Antimicrobials: Anti-infectives (From admission, onward)    Start     Dose/Rate Route Frequency Ordered Stop   07/28/21  1500  azithromycin (ZITHROMAX) 500 mg in sodium chloride 0.9 % 250 mL IVPB        500 mg 250 mL/hr over 60 Minutes Intravenous Every 24 hours 07/27/21 1744 08/02/21 1459   07/27/21 2000  ceFEPIme (MAXIPIME) 2 g in sodium chloride 0.9 % 100 mL IVPB        2 g 200 mL/hr over 30 Minutes Intravenous Every 8 hours 07/27/21 1753     07/27/21 1830  vancomycin (VANCOREADY) IVPB 1250 mg/250 mL  Status:  Discontinued        1,250 mg 166.7 mL/hr over 90 Minutes Intravenous Every 24 hours 07/27/21 1753 07/28/21 1054   07/27/21 1415  cefTRIAXone (ROCEPHIN) 2 g in sodium chloride 0.9 % 100 mL IVPB        2 g 200 mL/hr over 30 Minutes Intravenous  Once 07/27/21 1400 07/27/21 1453   07/27/21 1415  azithromycin (ZITHROMAX) 500 mg in sodium chloride 0.9 % 250 mL IVPB        500 mg 250 mL/hr over 60 Minutes Intravenous  Once 07/27/21 1400 07/27/21 1540       Subjective: Reports feeling better. Eager to go home soon  Objective: Vitals:   07/29/21 0401 07/29/21 0407 07/29/21 0812 07/29/21 1148  BP:  (!) 157/89  (!) 144/77  Pulse:  73  77  Resp:  18  18  Temp:  98 F (36.7 C)  98 F (36.7 C)  TempSrc:  Oral  Oral  SpO2:  100% 97% 99%  Weight: 60.1 kg     Height:        Intake/Output Summary (Last 24 hours) at 07/29/2021 1632 Last data filed at 07/29/2021 0954 Gross per 24 hour  Intake 1826.92 ml  Output 400 ml  Net 1426.92 ml   Filed Weights   07/27/21 2108 07/28/21 0345 07/29/21 0401  Weight: 57.2 kg 60.6 kg 60.1 kg    Examination: General exam: Appears calm and comfortable  Respiratory system: Clear to auscultation. Respiratory effort normal. Cardiovascular system: S1 & S2 heard, RRR Gastrointestinal system: Abdomen is nondistended, soft and nontender. No organomegaly or masses felt. Normal bowel sounds heard. Central nervous system: Alert and oriented. No focal neurological deficits. Extremities: Symmetric 5 x 5 power. Skin: No rashes, lesions or ulcers Psychiatry: Judgement and  insight appear normal. Mood & affect appropriate.   Data Reviewed: I have personally reviewed following labs and imaging studies  CBC: Recent Labs  Lab 07/27/21 1216 07/28/21 0354 07/29/21 0348  WBC 13.3* 11.2* 13.6*  NEUTROABS 11.8* 10.2*  --   HGB 11.9* 10.6* 9.8*  HCT 37.7* 32.5* 31.0*  MCV 88.3 87.4 89.1  PLT 354 287 818   Basic Metabolic Panel: Recent Labs  Lab 07/27/21 1216 07/28/21 0354 07/29/21 0348  NA 135 134* 136  K 4.5 4.3 4.3  CL 95* 99 103  CO2 34* 28 27  GLUCOSE 139* 151* 161*  BUN 14 17 21   CREATININE 0.44* 0.49* 0.44*  CALCIUM 9.1 8.0* 8.1*  MG  --  1.9 2.1  PHOS  --  3.5  --    GFR: Estimated Creatinine Clearance: 58.4 mL/min (A) (by C-G formula based on SCr of 0.44 mg/dL (L)). Liver Function Tests: Recent Labs  Lab 07/28/21 0354  AST 18  ALT 23  ALKPHOS 43  BILITOT 0.5  PROT 5.2*  ALBUMIN 2.1*   No results for input(s): LIPASE, AMYLASE in the last 168 hours. No results for input(s): AMMONIA in the last 168 hours. Coagulation Profile: Recent Labs  Lab 07/27/21 1213  INR 1.1   Cardiac Enzymes: No results for input(s): CKTOTAL, CKMB, CKMBINDEX, TROPONINI in the last 168 hours. BNP (last 3 results) No results for input(s): PROBNP in the last 8760 hours. HbA1C: No results for input(s): HGBA1C in the last 72 hours. CBG: Recent Labs  Lab 07/28/21 0725 07/29/21 0417 07/29/21 0720  GLUCAP 130* 148* 139*   Lipid Profile: No results for input(s): CHOL, HDL, LDLCALC, TRIG, CHOLHDL, LDLDIRECT in the last 72 hours. Thyroid Function Tests: No results for input(s): TSH, T4TOTAL, FREET4, T3FREE, THYROIDAB in the last 72 hours. Anemia Panel: No results for input(s): VITAMINB12, FOLATE, FERRITIN, TIBC, IRON, RETICCTPCT in the last 72 hours. Sepsis Labs: Recent Labs  Lab 07/27/21 1400  LATICACIDVEN 1.4    Recent Results (from the past 240 hour(s))  Blood culture (routine x 2)     Status: None (Preliminary result)   Collection Time:  07/27/21 12:00 PM   Specimen: BLOOD LEFT ARM  Result Value Ref Range Status   Specimen Description   Final    BLOOD LEFT ARM Performed at [facility], [address], [place], [place] [address]    Special Requests   Final    BOTTLES DRAWN AEROBIC AND ANAEROBIC Blood Culture results may not be optimal due to an excessive volume of blood received in culture bottles   Culture   Final    NO GROWTH 2 DAYS Performed at [facility] [address] [place], [place] [address]    Report Status PENDING  Incomplete  Resp Panel by RT-PCR (Flu A&B, Covid) Nasopharyngeal Swab     Status: None   Collection Time: 07/27/21 12:16 PM   Specimen: Nasopharyngeal Swab; Nasopharyngeal(NP) swabs in vial transport medium  Result Value Ref Range Status   SARS Coronavirus 2 by RT PCR NEGATIVE NEGATIVE Final    Comment: (NOTE) SARS-CoV-2 target nucleic acids are NOT DETECTED.  The SARS-CoV-2 RNA is generally detectable in upper respiratory specimens during the acute phase of infection. The lowest concentration of SARS-CoV-2 viral copies this assay can detect is 138 copies/mL. A negative result does not preclude SARS-Cov-2 infection and should not be used as the sole basis for treatment or other patient management decisions. A negative result may occur with  improper specimen collection/handling, submission of specimen other than nasopharyngeal swab, presence of viral mutation(s) within the areas targeted by this assay, and inadequate number of viral copies(<138 copies/mL). A negative result must be combined with clinical observations, patient history, and epidemiological information. The expected result is Negative.  Fact Sheet for Patients:  EntrepreneurPulse.com.au  Fact Sheet for Healthcare Providers:  IncredibleEmployment.be  This test is no t yet approved or cleared by the [place] FDA and  has been authorized for detection and/or  diagnosis of SARS-CoV-2 by FDA under an Emergency Use Authorization (EUA). This EUA will remain  in effect (meaning this test can be used) for the duration of the COVID-19 declaration under Section 564(b)(1) of the Act, 21 U.S.C.section 360bbb-3(b)(1), unless the authorization is terminated  or revoked sooner.       Influenza A by PCR NEGATIVE NEGATIVE Final   Influenza B by PCR NEGATIVE NEGATIVE Final    Comment: (NOTE) The Xpert Xpress SARS-CoV-2/FLU/RSV plus assay is intended as an aid in the diagnosis of  influenza from Nasopharyngeal swab specimens and should not be used as a sole basis for treatment. Nasal washings and aspirates are unacceptable for Xpert Xpress SARS-CoV-2/FLU/RSV testing.  Fact Sheet for Patients: EntrepreneurPulse.com.au  Fact Sheet for Healthcare Providers: IncredibleEmployment.be  This test is not yet approved or cleared by the [place] FDA and has been authorized for detection and/or diagnosis of SARS-CoV-2 by FDA under an Emergency Use Authorization (EUA). This EUA will remain in effect (meaning this test can be used) for the duration of the COVID-19 declaration under Section 564(b)(1) of the Act, 21 U.S.C. section 360bbb-3(b)(1), unless the authorization is terminated or revoked.  Performed at [facility], [address] [place] [address], [place], [place] [address]   Blood culture (routine x 2)     Status: None (Preliminary result)   Collection Time: 07/27/21  2:16 PM   Specimen: BLOOD RIGHT WRIST  Result Value Ref Range Status   Specimen Description   Final    BLOOD RIGHT WRIST Performed at [facility], [address] [place], [place] [address]    Special Requests   Final    BOTTLES DRAWN AEROBIC AND ANAEROBIC Blood Culture adequate volume   Culture   Final    NO GROWTH 2 DAYS Performed at [facility] [address], [place], [place] [address]    Report Status  PENDING  Incomplete  MRSA Next Gen by PCR, Nasal     Status: None   Collection Time: 07/27/21  6:53 PM   Specimen: Nasal Mucosa; Nasal Swab  Result Value Ref Range Status   MRSA by PCR Next Gen NOT DETECTED NOT DETECTED Final    Comment: (NOTE) The GeneXpert MRSA Assay (FDA approved for NASAL specimens only), is one component of a comprehensive MRSA colonization surveillance program. It is not intended to diagnose MRSA infection nor to guide or monitor treatment for MRSA infections. Test performance is not FDA approved in patients less than 31 years old. Performed at [facility], [place] [address] [place], [place] [address]   Urine Culture     Status: None   Collection Time: 07/27/21  8:32 PM   Specimen: Urine, Clean Catch  Result Value Ref Range Status   Specimen Description   Final    URINE, CLEAN CATCH Performed at [facility], [place] [address], [place], [place] [address]    Special Requests   Final    NONE Performed at [facility], [place] [address] [place], [place] [address]    Culture   Final    NO GROWTH Performed at [facility], [place] [address], [place], [place] [address]    Report Status 07/29/2021 FINAL  Final     Radiology Studies: DG Swallowing Func-Speech Pathology  Result Date: 07/28/2021 Table formatting from the original result was not included. Objective Swallowing Evaluation: Type of Study: MBS-Modified Barium Swallow Study  Patient Details Name: [name] MRN: [id number] Date of Birth: May 26, 1935 Today's Date: 07/28/2021 Time: SLP Start Time (ACUTE ONLY): 83 -SLP Stop Time (ACUTE ONLY): 1505 SLP Time Calculation (min) (ACUTE ONLY): 30 min Past Medical History: Past Medical History: Diagnosis Date  Allergic rhinitis, cause unspecified   Arthritis   COPD (chronic obstructive pulmonary disease) (HCC)   Dyspnea   Dysrhythmia   "1 episode of tachycardia in 1980's, been on it ever since"  Emphysema  of lung ([place])   Hypertension   Left inguinal hernia 02/07/2019  Low hemoglobin   low level  Neuromuscular disorder ([place])   Neuropathy   feet  Pneumonia   History of, last Oct 2019  Pulmonary nodule  Past Surgical History: Past Surgical History: Procedure Laterality Date  [place]  2003  performed at [facility], Dr. Fransico Him  CATARACT EXTRACTION  2008  Dr. Darleen Crocker  CATARACT EXTRACTION, BILATERAL    EYE SURGERY    INGUINAL HERNIA REPAIR Left 02/07/2019  Procedure: OPEN REPAIR LEFT INGUINAL HERNIA WITH MESH;  Surgeon: Fanny Skates, MD;  Location: [facility];  Service: General;  Laterality: Left;  SPINAL AND TAP BLOCK ANESTHESIA  ROTATOR CUFF REPAIR  2002  Dr. Kathryne Hitch  TONSILLECTOMY  1945  VASECTOMY   HPI: Pt is a n 86 yo male adm to hospital - with respiratory issues - diagnosed with pna.  Pt reports being recently treated with ABX, steroid approx 4 weeks ago and more recently by Dr. Keturah Barre.  Large airspace opacity is noted in right upper and lower lobes concerning for pneumonia, small sliding hiatal hernia  No prior swallow evals.  Pt admits to having problems with "feeling full" pointing to proximal esophagus and having "gas".  He denies dysphagia.  Pt failed [facility] swallow screen and MBS ordered.  Subjective: pt awake in chair  Recommendations for follow up therapy are one component of a multi-disciplinary discharge planning process, led by the attending physician.  Recommendations may be updated based on patient status, additional functional criteria and insurance authorization. Assessment / Plan / Recommendation Clinical Impressions 07/28/2021 Clinical Impression Patient presents with mild dysphagia with suspected oral compensation due to his COPD c/b lingual pumping, prolonged oral transiting with lingual pumping with liquids. Oral hold may aid voluntary swallow control and increases time for breathing, thus may improve breathing and increase post-swallow exhalation.   Pharyngeal swallow is overall strong and timely with initial laryngeal penetration of thin via cup *with first swallow of the study*.   Aspiration of lateral channels secretion and barium retention observed (suspect of nectar liquids pt previously had swallowed) as retention spilled into open larynx while pudding barium was at vallecular space.  This did not recur across all further boluses.  Suspect pt has level of chronic low grade aspiration for which mitigation strategies indicated. In addition, pt appears with prominent CP/UES - developing CP bar causing SLP to suspect excessive pressure in the esophagus.  Upon esophageal sweep, pt appeared with barium lining his esophagus.  Recommend pt follow strict esophageal/reflux precautions.  In addition, pt required frequent rest breaks during exam  and thus swallow/respiratory dysynchrony may allow episodic  aspiration.  Using teach back, educated son, Liliane Channel, to findings/recommendations using video loops. Pt had been taken upstairs.  Will follow up briefly and  consider RMST if pt will participate. SLP Visit Diagnosis Dysphagia, unspecified (R13.10);Dysphagia, oral phase (R13.11) Attention and concentration deficit following -- Frontal lobe and executive function deficit following -- Impact on safety and function Mild aspiration risk   Treatment Recommendations 07/28/2021 Treatment Recommendations Therapy as outlined in treatment plan below   Prognosis 07/28/2021 Prognosis for Safe Diet Advancement Fair Barriers to Reach Goals Time post onset Barriers/Prognosis Comment -- Diet Recommendations 07/28/2021 SLP Diet Recommendations Dysphagia 3 (Mech soft) solids;Thin liquid Liquid Administration via Cup Medication Administration Other (Comment) Compensations Slow rate;Small sips/bites Postural Changes Remain semi-upright after after feeds/meals (Comment);Seated upright at 90 degrees   Other Recommendations 07/28/2021 Recommended Consults -- Oral Care Recommendations Oral  care QID Other Recommendations Have oral suction available Follow Up Recommendations --  Assistance recommended at discharge Intermittent Supervision/Assistance Functional Status Assessment Patient has had a recent decline in their functional status and demonstrates the ability to make significant improvements in function in a reasonable and predictable amount of time. Frequency and Duration  07/28/2021 Speech Therapy Frequency (ACUTE ONLY) min 1 x/week Treatment Duration 2 weeks   Oral Phase 07/28/2021 Oral Phase Impaired Oral - Pudding Teaspoon -- Oral - Pudding Cup -- Oral - Honey Teaspoon -- Oral - Honey Cup -- Oral - Nectar Teaspoon -- Oral - Nectar Cup Lingual pumping;Delayed oral transit;Premature spillage Oral - Nectar Straw -- Oral - Thin Teaspoon -- Oral - Thin Cup Delayed oral transit;Lingual pumping Oral - Thin Straw Lingual pumping;Delayed oral transit Oral - Puree Lingual pumping;Delayed oral transit Oral - Mech Soft Lingual pumping;Delayed oral transit Oral - Regular -- Oral - Multi-Consistency -- Oral - Pill Lingual pumping;Reduced posterior propulsion Oral Phase - Comment Pt  Pharyngeal Phase 07/28/2021 Pharyngeal Phase Impaired Pharyngeal- Pudding Teaspoon -- Pharyngeal -- Pharyngeal- Pudding Cup -- Pharyngeal -- Pharyngeal- Honey Teaspoon -- Pharyngeal -- Pharyngeal- Honey Cup -- Pharyngeal -- Pharyngeal- Nectar Teaspoon -- Pharyngeal -- Pharyngeal- Nectar Cup [facility] Pharyngeal Material does not enter airway Pharyngeal- Nectar Straw -- Pharyngeal -- Pharyngeal- Thin Teaspoon -- Pharyngeal -- Pharyngeal- Thin Cup WFL;Penetration/Aspiration during swallow Pharyngeal Material does not enter airway Pharyngeal- Thin Straw WFL Pharyngeal Material does not enter airway Pharyngeal- Puree WFL Pharyngeal Material does not enter airway Pharyngeal- Mechanical Soft WFL Pharyngeal Material does not enter airway Pharyngeal- Regular -- Pharyngeal -- Pharyngeal- Multi-consistency -- Pharyngeal -- Pharyngeal- Pill  Reduced epiglottic inversion;Pharyngeal residue - valleculae Pharyngeal Material does not enter airway Pharyngeal Comment barium tablet stalled at vallecular region when taken with thin with pt awareness, pudding transited it into cervical esophagus - where it appeared to halt above UES - more liquids facilitated clearance into esophagus, prolonged oral transiting is self created compensations strategy likely due to pt's COPD  Cervical Esophageal Phase  07/28/2021 Cervical Esophageal Phase Impaired Pudding Teaspoon -- Pudding Cup -- Honey Teaspoon -- Honey Cup -- Nectar Teaspoon -- Nectar Cup Prominent cricopharyngeal segment Nectar Straw -- Thin Teaspoon -- Thin Cup Prominent cricopharyngeal segment Thin Straw Prominent cricopharyngeal segment Puree Prominent cricopharyngeal segment Mechanical Soft Prominent cricopharyngeal segment Regular Reduced cricopharyngeal relaxation;Prominent cricopharyngeal segment Multi-consistency -- Pill -- Cervical Esophageal Comment -- Kathleen Lime, MS [facility] SLP Acute Rehab Services Office [phone] Pager [phone] Macario Golds 07/28/2021, 4:21 PM                     ECHOCARDIOGRAM COMPLETE  Result Date: 07/28/2021    ECHOCARDIOGRAM REPORT   Patient Name:   [name] Date of Exam: 07/28/2021 Medical Rec #:  [id number]        Height:       69.0 in Accession #:    [id number]       Weight:       133.5 lb Date of Birth:  1934-09-21        BSA:          1.740 m Patient Age:    65 years         BP:           155/81 mmHg Patient Gender: M                HR:           93 bpm. Exam Location:  Inpatient Procedure: 2D Echo, Cardiac Doppler and Color Doppler Indications:  Pulmonary Emblous  History:        Patient has prior history of Echocardiogram examinations, most                 recent 09/28/2019. CAD, COPD; Risk Factors:Hypertension.  Sonographer:    Glo Herring Referring Phys: [id number] DANIEL V THOMPSON  Sonographer Comments: No apical window. Limited echo due to poor  acoustic windows IMPRESSIONS  1. Left ventricular ejection fraction, by estimation, is 60 to 65%. The left ventricle has normal function. The left ventricle has no regional wall motion abnormalities. Left ventricular diastolic function could not be evaluated.  2. Right ventricular systolic function is normal. The right ventricular size is moderately enlarged. There is mildly elevated pulmonary artery systolic pressure.  3. Right atrial size was mildly dilated.  4. The mitral valve is grossly normal. No evidence of mitral valve regurgitation.  5. Tricuspid valve regurgitation is mild to moderate.  6. The aortic valve is tricuspid. Aortic valve regurgitation is not visualized. No aortic stenosis is present.  7. The inferior vena cava is normal in size with greater than 50% respiratory variability, suggesting right atrial pressure of 3 mmHg. Comparison(s): No significant change from prior study. Prior images reviewed side by side. FINDINGS  Left Ventricle: Left ventricular ejection fraction, by estimation, is 60 to 65%. The left ventricle has normal function. The left ventricle has no regional wall motion abnormalities. The left ventricular internal cavity size was normal in size. There is  no left ventricular hypertrophy. Left ventricular diastolic function could not be evaluated. Right Ventricle: The right ventricular size is moderately enlarged. No increase in right ventricular wall thickness. Right ventricular systolic function is normal. There is mildly elevated pulmonary artery systolic pressure. The tricuspid regurgitant velocity is 3.12 m/s, and with an assumed right atrial pressure of 3 mmHg, the estimated right ventricular systolic pressure is 16.3 mmHg. Left Atrium: Left atrial size was normal in size. Right Atrium: Right atrial size was mildly dilated. Pericardium: There is no evidence of pericardial effusion. Mitral Valve: The mitral valve is grossly normal. No evidence of mitral valve regurgitation.  Tricuspid Valve: The tricuspid valve is normal in structure. Tricuspid valve regurgitation is mild to moderate. Aortic Valve: The aortic valve is tricuspid. Aortic valve regurgitation is not visualized. No aortic stenosis is present. Pulmonic Valve: The pulmonic valve was not well visualized. Pulmonic valve regurgitation is not visualized. Aorta: The aortic root was not well visualized. Venous: The inferior vena cava is normal in size with greater than 50% respiratory variability, suggesting right atrial pressure of 3 mmHg. IAS/Shunts: No atrial level shunt detected by color flow Doppler.  LEFT VENTRICLE PLAX 2D LVIDd:         3.70 cm LVIDs:         2.20 cm LV PW:         0.90 cm LV IVS:        0.90 cm  LEFT ATRIUM         Index LA diam:    3.10 cm 1.78 cm/m  TRICUSPID VALVE TR Peak grad:   38.9 mmHg TR Vmax:        312.00 cm/s Sanda Klein MD Electronically signed by Sanda Klein MD Signature Date/Time: 07/28/2021/1:57:27 PM    Final    VAS [place] LOWER EXTREMITY VENOUS (DVT)  Result Date: 07/28/2021  Lower Venous DVT Study Patient Name:  [name]  Date of Exam:   07/28/2021 Medical Rec #: [id number]  Accession #:    [id number] Date of Birth: 1935/03/07         Patient Gender: M Patient Age:   38 years Exam Location:  [facility] Procedure:      VAS [place] LOWER EXTREMITY VENOUS (DVT) Referring Phys: Irine Seal --------------------------------------------------------------------------------  Indications: Pulmonary embolism.  Comparison Study: Previous exam (LLEV) on 05/19/2018 was negative for DVT. Performing Technologist: Rogelia Rohrer RVT, RDMS  Examination Guidelines: A complete evaluation includes B-mode imaging, spectral Doppler, color Doppler, and power Doppler as needed of all accessible portions of each vessel. Bilateral testing is considered an integral part of a complete examination. Limited examinations for reoccurring indications may be performed as noted. The reflux portion of  the exam is performed with the patient in reverse Trendelenburg.  +---------+---------------+---------+-----------+----------+--------------+  RIGHT     Compressibility Phasicity Spontaneity Properties Thrombus Aging  +---------+---------------+---------+-----------+----------+--------------+  CFV       Full            Yes       Yes                                    +---------+---------------+---------+-----------+----------+--------------+  SFJ       Full                                                             +---------+---------------+---------+-----------+----------+--------------+  FV Prox   Full            Yes       Yes                                    +---------+---------------+---------+-----------+----------+--------------+  FV Mid    Full            Yes       Yes                                    +---------+---------------+---------+-----------+----------+--------------+  FV Distal Full            Yes       Yes                                    +---------+---------------+---------+-----------+----------+--------------+  PFV       Full                                                             +---------+---------------+---------+-----------+----------+--------------+  POP       Full            Yes       Yes                                    +---------+---------------+---------+-----------+----------+--------------+  PTV       Full                                                             +---------+---------------+---------+-----------+----------+--------------+  PERO      Full                                                             +---------+---------------+---------+-----------+----------+--------------+   +---------+---------------+---------+-----------+----------+--------------+  LEFT      Compressibility Phasicity Spontaneity Properties Thrombus Aging  +---------+---------------+---------+-----------+----------+--------------+  CFV       Full            Yes       Yes                                     +---------+---------------+---------+-----------+----------+--------------+  SFJ       Full                                                             +---------+---------------+---------+-----------+----------+--------------+  FV Prox   Full            Yes       Yes                                    +---------+---------------+---------+-----------+----------+--------------+  FV Mid    Full            Yes       Yes                                    +---------+---------------+---------+-----------+----------+--------------+  FV Distal Full            Yes       Yes                                    +---------+---------------+---------+-----------+----------+--------------+  PFV       Full                                                             +---------+---------------+---------+-----------+----------+--------------+  POP       Full            Yes       Yes                                    +---------+---------------+---------+-----------+----------+--------------+  PTV       Full                                                             +---------+---------------+---------+-----------+----------+--------------+  PERO      Full                                                             +---------+---------------+---------+-----------+----------+--------------+     Summary: BILATERAL: - No evidence of deep vein thrombosis seen in the lower extremities, bilaterally. - No evidence of superficial venous thrombosis in the lower extremities, bilaterally. -No evidence of popliteal cyst, bilaterally.   *See table(s) above for measurements and observations. Electronically signed by Deitra Mayo MD on 07/28/2021 at 1:56:09 PM.    Final     Scheduled Meds:  budesonide (PULMICORT) nebulizer solution  0.5 mg Nebulization BID   fluticasone  2 spray Each Nare Daily   guaiFENesin  1,200 mg Oral BID   ipratropium  0.5 mg Nebulization BID   levalbuterol  0.63 mg Nebulization BID    loratadine  10 mg Oral Daily   losartan  25 mg Oral Daily   methylPREDNISolone (SOLU-MEDROL) injection  60 mg Intravenous Q12H   pantoprazole  40 mg Oral Q0600   polyethylene glycol  17 g Oral Daily   pravastatin  40 mg Oral Daily   Ensure Max Protein  2 oz Oral Daily   senna  1 tablet Oral BID   sodium chloride flush  3 mL Intravenous Q12H   tamsulosin  0.4 mg Oral Daily   vitamin B-12  1,000 mcg Oral Daily   Continuous Infusions:  sodium chloride Stopped (07/27/21 1529)   sodium chloride Stopped (07/27/21 1440)   sodium chloride 75 mL/hr at 07/28/21 1025   azithromycin 500 mg (07/29/21 1504)   ceFEPime (MAXIPIME) IV 2 g (07/29/21 1151)   heparin 1,100 Units/hr (07/29/21 1528)     LOS: 2 days   Marylu Lund, MD [facility] Pager On Amion  If 7PM-7AM, please contact night-coverage 07/29/2021, 4:32 PM

## 2021-07-29 NOTE — Evaluation (Signed)
Occupational Therapy Evaluation Patient Details Name: [name] MRN: [id number] DOB: 27-Nov-1934 Today's Date: 07/29/2021   History of Present Illness Patient 86 year old gentleman history of chronic respiratory failure secondary to COPD on 3 L nasal cannula baseline at rest and at bedtime and 4 to 6 L on exertion, hypertension hyperlipidemia CAD, BPH presented with a 1 month history of worsening shortness of breath, cough and wheezing.  Patient received 2 rounds of antibiotics in the outpatient setting with no significant clinical improvement.  CT chest done on admission concerning for small peripheral pulmonary embolus in the upper lobe of the left pulmonary artery, large airspace opacity involving the right upper and lower lungs concerning for pneumonia.  Also concern for acute COPD exacerbation.   Clinical Impression   [name] is an 86 year old man who appears to be near his baseline in regards to ADLs and functional mobility. Patient found on 4 L [place]. He reports independence at home with rollator and home oxygen - using between 3-6 liters as needed for activity. Patient demonstrates good upper body strength, the ability to perform LB dressing at his baseline and ability to perform functional mobility. His oxygen saturation did dropped to 78% on 4 L with donning socks but otherwise was difficulty to get a reading in a timely manner. Patient reports feeling near his baseline in regards to self care tasks and reports hospital gown and lines/leads his only limiting factors. Patient has no OT needs at this time as he is familiar with energy conservation principles and managing oxygen at home. Defer to PT for discharge needs.       Recommendations for follow up therapy are one component of a multi-disciplinary discharge planning process, led by the attending physician.  Recommendations may be updated based on patient status, additional functional criteria and insurance authorization.    Follow Up Recommendations  No OT follow up    Assistance Recommended at Discharge Intermittent Supervision/Assistance  Functional Status Assessment  Patient has not had a recent decline in their functional status  Equipment Recommendations  None recommended by OT    Recommendations for Other Services       Precautions / Restrictions Precautions Precaution Comments: monitor O2 sats Restrictions Weight Bearing Restrictions: No      Mobility Bed Mobility               General bed mobility comments: up in chair    Transfers Overall transfer level: Needs assistance Equipment used: Rolling walker (2 wheels) Transfers: Sit to/from Stand Sit to Stand: Supervision           General transfer comment: Able to stand from chair with use of walker. No overt loss of balance. Able to take hands off of walker for clothing management as needed.      Balance Overall balance assessment: Mild deficits observed, not formally tested                                         ADL either performed or assessed with clinical judgement   ADL Overall ADL's : At baseline                                       General ADL Comments: Has some difficulty donning right sock which is his baseline. Able  to don left sock. reports able to perform toileting earlier with nursing and only limited by lines/leads and hospital gown. Able to stand and demonstrates abilitty to pull pants up.     Vision Patient Visual Report: No change from baseline       Perception     Praxis      Pertinent Vitals/Pain Pain Assessment: No/denies pain     Hand Dominance Right   Extremity/Trunk Assessment Upper Extremity Assessment Upper Extremity Assessment: Overall WFL for tasks assessed   Lower Extremity Assessment Lower Extremity Assessment: Defer to PT evaluation   Cervical / Trunk Assessment Cervical / Trunk Assessment: Normal   Communication  Communication Communication: No difficulties   Cognition Arousal/Alertness: Awake/alert Behavior During Therapy: WFL for tasks assessed/performed Overall Cognitive Status: Within Functional Limits for tasks assessed                                       General Comments       Exercises     Shoulder Instructions      Home Living Family/patient expects to be discharged to:: Private residence Living Arrangements: Spouse/significant other Available Help at Discharge: Family;Available 24 hours/day Type of Home: House Home Access: Stairs to enter [employer] of Steps: 1   Home Layout: Two level;Able to live on main level with bedroom/bathroom (has a stair lift)     Bathroom Shower/Tub: Occupational psychologist: Handicapped height     Home Equipment: [place] - built in;Rollator (4 wheels);Grab bars - tub/shower;Grab bars - toilet          Prior Functioning/Environment Prior Level of Function : Independent/Modified Independent                        OT Problem List: Cardiopulmonary status limiting activity;Decreased activity tolerance      OT Treatment/Interventions:      OT Goals(Current goals can be found in the care plan section) Acute Rehab OT Goals OT Goal Formulation: All assessment and education complete, DC therapy  OT Frequency: Min 2X/week   Barriers to D/C:            Co-evaluation              AM-PAC OT "6 Clicks" Daily Activity     Outcome Measure Help from another person eating meals?: None Help from another person taking care of personal grooming?: None Help from another person toileting, which includes using toliet, bedpan, or urinal?: None Help from another person bathing (including washing, rinsing, drying)?: None Help from another person to put on and taking off regular upper body clothing?: None Help from another person to put on and taking off regular lower body clothing?: None 6 Click  Score: 24   End of Session Equipment Utilized During Treatment: Rolling walker (2 wheels);Oxygen Nurse Communication:  (okay to see)  Activity Tolerance: Patient tolerated treatment well Patient left: in chair;with call bell/phone within reach;with family/visitor present;with chair alarm set  OT Visit Diagnosis: Muscle weakness (generalized) (M62.81)                Time: 9563-8756 OT Time Calculation (min): 23 min Charges:  OT General Charges $OT Visit: 1 Visit OT Evaluation $OT Eval Low Complexity: 1 Low  Chidinma Clites, OTR/L [place]  Office [phone] Pager: [phone] 07/29/2021, 3:04 PM

## 2021-07-29 NOTE — Plan of Care (Signed)

## 2021-07-29 NOTE — Evaluation (Signed)
Physical Therapy Evaluation Patient Details Name: [name] MRN: [id number] DOB: 02/28/1935 Today's Date: 07/29/2021  History of Present Illness  86 yo male admitted with acute respiratory failure. Diagnosed with acute PE, COPD exac. Hx of COPD-O2 dep (3L-6L), CAD, NM d/o, neuropathy, L1 comp fx  Clinical Impression  On eval, pt required Min guard assist for mobility. He walked ~75 feet x 2 with use of a rollator. He tolerated activity well. Seated rest break between walks. HR 112 bpm, O2 96% on 6L, dyspnea 2-3/4 with ambulation. Discussed d/c plan with pt and son-pt feels he will be fine at home. He stated he can arrange for some in home aide care and son stated he will stay through the wknd. PT recommendation is for HHPT       Recommendations for follow up therapy are one component of a multi-disciplinary discharge planning process, led by the attending physician.  Recommendations may be updated based on patient status, additional functional criteria and insurance authorization.  Follow Up Recommendations Home health PT    Assistance Recommended at Discharge Frequent or constant Supervision/Assistance  Functional Status Assessment Patient has had a recent decline in their functional status and demonstrates the ability to make significant improvements in function in a reasonable and predictable amount of time.  Equipment Recommendations  None recommended by PT    Recommendations for Other Services       Precautions / Restrictions Precautions Precautions: Fall Precaution Comments: monitor O2-O2 dep Restrictions Weight Bearing Restrictions: No      Mobility  Bed Mobility Overal bed mobility: Needs Assistance Bed Mobility: Sit to Supine       Sit to supine: Modified independent (Device/Increase time)   General bed mobility comments: up in chair    Transfers Overall transfer level: Needs assistance Equipment used: Rollator (4 wheels) Transfers: Sit to/from  Stand Sit to Stand: Min guard           General transfer comment: Min guard for safety. Cues for hand placement.    Ambulation/Gait Ambulation/Gait assistance: Min guard Gait Distance (Feet): 75 Feet (x2) Assistive device: Rollator (4 wheels) Gait Pattern/deviations: Step-through pattern;Decreased stride length       General Gait Details: Walked x 2 with seated rest break between walks. O2 96% on 6L, dyspnea 3/4 with ambulation.  Stairs            Wheelchair Mobility    Modified Rankin (Stroke Patients Only)       Balance Overall balance assessment: Needs assistance         Standing balance support: Bilateral upper extremity supported Standing balance-Leahy Scale: Poor                               Pertinent Vitals/Pain Pain Assessment: No/denies pain    Home Living Family/patient expects to be discharged to:: Private residence Living Arrangements: Spouse/significant other Available Help at Discharge: Family;Available 24 hours/day Type of Home: House Home Access: Stairs to enter   [employer] of Steps: 1   Home Layout: Two level;Able to live on main level with bedroom/bathroom (has a stair lift) Home Equipment: Shower seat - built in;Rollator (4 wheels);Grab bars - tub/shower;Grab bars - toilet      Prior Function Prior Level of Function : Independent/Modified Independent                     Hand Dominance   Dominant Hand: Right  Extremity/Trunk Assessment   Upper Extremity Assessment Upper Extremity Assessment: Defer to OT evaluation    Lower Extremity Assessment Lower Extremity Assessment: Generalized weakness (hx of neuropathy)    Cervical / Trunk Assessment Cervical / Trunk Assessment: Normal  Communication   Communication: No difficulties  Cognition Arousal/Alertness: Awake/alert Behavior During Therapy: WFL for tasks assessed/performed Overall Cognitive Status: Within Functional Limits for tasks  assessed                                          General Comments      Exercises     Assessment/Plan    PT Assessment Patient needs continued PT services  PT Problem List Decreased strength;Decreased mobility;Decreased activity tolerance;Decreased balance;Decreased knowledge of use of DME       PT Treatment Interventions DME instruction;Gait training;Therapeutic activities;Therapeutic exercise;Patient/family education;Balance training;Functional mobility training    PT Goals (Current goals can be found in the Care Plan section)  Acute Rehab PT Goals Patient Stated Goal: home PT Goal Formulation: With patient/family Time For Goal Achievement: 08/12/21 Potential to Achieve Goals: Good    Frequency Min 3X/week   Barriers to discharge Decreased caregiver support wife provides limited assistance. son only present for a few days    Co-evaluation               AM-PAC PT "6 Clicks" Mobility  Outcome Measure Help needed turning from your back to your side while in a flat bed without using bedrails?: None Help needed moving from lying on your back to sitting on the side of a flat bed without using bedrails?: None Help needed moving to and from a bed to a chair (including a wheelchair)?: A Little Help needed standing up from a chair using your arms (e.g., wheelchair or bedside chair)?: A Little Help needed to walk in hospital room?: A Little Help needed climbing 3-5 steps with a railing? : A Little 6 Click Score: 20    End of Session Equipment Utilized During Treatment: Gait belt;Oxygen Activity Tolerance: Patient tolerated treatment well Patient left: in bed;with call bell/phone within reach;with family/visitor present        Time: 1420-1520 PT Time Calculation (min) (ACUTE ONLY): 60 min   Charges:   PT Evaluation $PT Eval Moderate Complexity: 1 Mod PT Treatments $Gait Training: 23-37 mins $Therapeutic Activity: 8-22 mins           Doreatha Massed, PT Acute Rehabilitation  Office: [phone] Pager: [phone]

## 2021-07-29 NOTE — Consult Note (Signed)
[facility] CM Inpatient Consult   07/29/2021  JENTRY WARNELL 05/24/1935 [id number]  [facility] CM)   Patient's chart reviewed for [facility] CM services post hospital chronic disease management and care coordination. Patient is currently active with [facility] CM health coach.   Plan: Will continue to follow for progression and disposition plans.  Of note, [facility] Care Management services does not replace or interfere with any services that are arranged by inpatient case management or social work.   [name], MSN, RN [facility] [employer] [phone]  Toll free office [phone]

## 2021-07-30 ENCOUNTER — Other Ambulatory Visit (HOSPITAL_COMMUNITY): Payer: Self-pay

## 2021-07-30 ENCOUNTER — Other Ambulatory Visit: Payer: Self-pay

## 2021-07-30 DIAGNOSIS — J301 Allergic rhinitis due to pollen: Secondary | ICD-10-CM

## 2021-07-30 DIAGNOSIS — J441 Chronic obstructive pulmonary disease with (acute) exacerbation: Secondary | ICD-10-CM

## 2021-07-30 LAB — CBC
HCT: 33.7 % — ABNORMAL LOW (ref 39.0–52.0)
Hemoglobin: 10.5 g/dL — ABNORMAL LOW (ref 13.0–17.0)
MCH: 28.8 pg (ref 26.0–34.0)
MCHC: 31.2 g/dL (ref 30.0–36.0)
MCV: 92.3 fL (ref 80.0–100.0)
Platelets: 292 10*3/uL (ref 150–400)
RBC: 3.65 MIL/uL — ABNORMAL LOW (ref 4.22–5.81)
RDW: 13.2 % (ref 11.5–15.5)
WBC: 12.7 10*3/uL — ABNORMAL HIGH (ref 4.0–10.5)
nRBC: 0 % (ref 0.0–0.2)

## 2021-07-30 LAB — GLUCOSE, CAPILLARY: Glucose-Capillary: 126 mg/dL — ABNORMAL HIGH (ref 70–99)

## 2021-07-30 MED ORDER — APIXABAN (ELIQUIS) VTE STARTER PACK (10MG AND 5MG)
ORAL_TABLET | ORAL | 0 refills | Status: DC
  Filled 2021-07-30: qty 74, 28d supply, fill #0

## 2021-07-30 MED ORDER — AZITHROMYCIN 250 MG PO TABS: ORAL_TABLET | ORAL | 0 refills | Status: DC

## 2021-07-30 MED ORDER — APIXABAN 5 MG PO TABS: 5.0000 mg | ORAL_TABLET | Freq: Two times a day (BID) | ORAL | 0 refills | Status: DC

## 2021-07-30 MED ORDER — PREDNISONE 10 MG PO TABS
ORAL_TABLET | ORAL | 0 refills | Status: DC
  Filled 2021-07-30: qty 22, 11d supply, fill #0

## 2021-07-30 MED ORDER — AZITHROMYCIN 250 MG PO TABS
ORAL_TABLET | ORAL | 0 refills | Status: DC
  Filled 2021-07-30: qty 3, 3d supply, fill #0

## 2021-07-30 MED ORDER — PREDNISONE 10 MG PO TABS: ORAL_TABLET | ORAL | 0 refills | Status: AC

## 2021-07-30 MED ORDER — CEFDINIR 300 MG PO CAPS: 300.0000 mg | ORAL_CAPSULE | Freq: Two times a day (BID) | ORAL | 0 refills | Status: AC

## 2021-07-30 MED ORDER — CEFDINIR 300 MG PO CAPS
300.0000 mg | ORAL_CAPSULE | Freq: Two times a day (BID) | ORAL | 0 refills | Status: DC
  Filled 2021-07-30: qty 6, 3d supply, fill #0

## 2021-07-30 NOTE — TOC Transition Note (Signed)
Transition of Care [facility]) - CM/SW Discharge Note   Patient Details  Name: [name] MRN: [id number] Date of Birth: [birth year]  Transition of Care [facility] CM/SW Contact:  Leeroy Cha, RN Phone Number: 07/30/2021, 1:44 PM   Clinical Narrative:    [facility] physical therapy to be done through [employer].   Final next level of care: [place] Barriers to Discharge: Barriers Resolved   Patient Goals and CMS Choice Patient states their goals for this hospitalization and ongoing recovery are:: to go home CMS Medicare.gov Compare Post Acute Care list provided to:: Patient Choice offered to / list presented to : [place] / [place]  Discharge Placement                       Discharge Plan and Services   Discharge Planning Services: CM Consult                      HH Arranged: PT [facility] Agency: [place] Date [place]: 07/30/21 Time [place]: 6314 Representative spoke with at [place]: [place] ([place]) Interventions     Readmission Risk Interventions No flowsheet data found.

## 2021-07-30 NOTE — Discharge Summary (Signed)
Physician Discharge Summary  [name]:[id number] DOB: 04/01/1935 DOA: 07/27/2021  PCP: Gayland Curry, DO  Admit date: 07/27/2021 Discharge date: 07/30/2021  Admitted From: Home Disposition:  Home  Recommendations for Outpatient Follow-up:  Follow up with PCP in 1-2 weeks Follow up with Pulmonary as scheduled Recommend repeat CXR in 2-3 weeks to ensure resolution  Home Health:PT   Discharge Condition:Improved CODE STATUS:DNR Diet recommendation: Heart healthy   Brief/Interim Summary: 86 year old gentleman history of chronic respiratory failure secondary to COPD on 3 L nasal cannula baseline at rest and at bedtime and 4 to 6 L on exertion, hypertension hyperlipidemia CAD, BPH presented with a 1 month history of worsening shortness of breath, cough and wheezing.  Patient received 2 rounds of antibiotics in the outpatient setting with no significant clinical improvement.  CT chest done on admission concerning for small peripheral pulmonary embolus in the upper lobe of the left pulmonary artery, large airspace opacity involving the right upper and lower lungs concerning for pneumonia.  Also concern for acute COPD exacerbation.  Patient admitted, placed empirically on IV antibiotics, IV heparin, nebulizer treatments.    Discharge Diagnoses:  Principal Problem:   Acute on chronic respiratory failure with hypoxia (HCC) Active Problems:   Allergic rhinitis   COPD mixed type (HCC)   CAD (coronary artery disease)   Hyperlipidemia   Hypertension   COPD with acute exacerbation (HCC)   Pneumonia   Benign prostatic hyperplasia with nocturia   Venous insufficiency of both lower extremities   Community acquired pneumonia   Acute pulmonary embolism ([place])  #1 acute on chronic respiratory failure with hypoxia secondary to multifocal right-sided pneumonia and pulmonary emboli and probable COPD exacerbation. -Patient presented with worsening shortness of breath, diffuse wheezing,  productive cough which have been ongoing for the past month with no significant improvement after 2 rounds of antibiotics and prednisone. -Chest x-ray done on presentation with concerns of a right upper lobe pneumonia.  Patient also noted to have a leukocytosis with a left shift. -CT angiogram chest done with concerning for small peripheral left upper lobe PE, large airspace opacity in the right upper and lower lobes concerning for pneumonia. -Urine Legionella antigen pending.  Urine pneumococcus antigen negative.  MRSA PCR negative.  -Discontinued IV vancomycin. -Pt later continued on IV cefepime and IV azithromycin and subsequently azithromycin with omnicef on d/c -Recommend repeat CXR in 2-3 weeks to ensure resolution -Pt advised to f/u with his Pulmonologist as scheduled  2.  Multifocal right-sided pneumonia -Noted on chest x-ray and CT angiogram chest. -Blood cultures neg -Urine strep pneumococcus antigen negative.  -Urine Legionella antigen negative -MRSA PCR negative.  -Patient was seen by SLP with recommendations for dysphagia 3 diet.  -Complete azithromycin and omicef per above   3.  Pulmonary emboli -Noted on CT angiogram chest. -Patient with no prior history of DVT or PE, no recent surgeries, no recent long car rides or travel. -2D echo with no right ventricular strain.   -Lower extremity Dopplers negative for DVT.   -Initially on heparin gtt, transitioned to eliquis on d/c  4.  Acute COPD exacerbation -Likely exacerbated by multifocal right-sided pneumonia. -Patient improved with scheduled Xopenex, Atrovent nebs, Pulmicort, Claritin, Flonase, PPI, empiric IV antibiotics.   -IV steroids weaned to prednisone at time of d/c Prednisone taper prescribed per med list -Pt to continue home O2 as per baseline  5.  Hypertension -Cozaar.   -stable  6.  Hyperlipidemia -Statin.  7.  BPH -Continue Flomax.  Discharge Instructions   Allergies as of 07/30/2021        Reactions   Tolectin [tolmetin Sodium] Other (See Comments)   BP low and passed out   Montelukast Sodium Other (See Comments)   Unknown reaction   Nsaids Other (See Comments)   MD told pt not to use   Augmentin [amoxicillin-pot Clavulanate] Nausea And Vomiting   Did it involve swelling of the face/tongue/throat, SOB, or low BP? No Did it involve sudden or severe rash/hives, skin peeling, or any reaction on the inside of your mouth or nose? No Did you need to seek medical attention at a hospital or doctor's office? No When did it last happen? Last year   If all above answers are "NO", may proceed with cephalosporin use.   Neosporin [bacitracin-polymyxin B] Rash        Medication List     STOP taking these medications    levofloxacin 750 MG tablet Commonly known as: LEVAQUIN   Stiolto Respimat 2.5-2.5 MCG/ACT Aers Generic drug: Tiotropium Bromide-Olodaterol       TAKE these medications    acetaminophen 650 MG CR tablet Commonly known as: TYLENOL Take 1,300 mg by mouth See admin instructions. Take 2 tablets (1300 mg) by mouth every morning, may also take 1-2 tablets ([phone] mg) at 4p-5p if needed for pain   Apixaban Starter Pack (10mg  and 5mg ) Commonly known as: ELIQUIS STARTER PACK Take as directed on package: start with two-5mg  tablets twice daily for 7 days. On day 8, switch to one-5mg  tablet twice daily.   azithromycin 250 MG tablet Commonly known as: Zithromax 1 tab po daily x 3 more days, zero refills   Breztri Aerosphere 160-9-4.8 MCG/ACT Aero Generic drug: Budeson-Glycopyrrol-Formoterol INHALE 2 PUFFS INTO THE LUNGS IN THE MORNING AND AT BEDTIME What changed:  how much to take how to take this when to take this additional instructions   cefdinir 300 MG capsule Commonly known as: OMNICEF Take 1 capsule (300 mg total) by mouth 2 (two) times daily for 3 days.   DULoxetine 20 MG capsule Commonly known as: Cymbalta Take 1 capsule (20 mg total) by mouth  daily.   Ensure High Protein Liqd Take 5.5-11 oz by mouth daily.   ipratropium-albuterol 0.5-2.5 (3) MG/3ML Soln Commonly known as: DUONEB Take 3 mLs by nebulization every 6 (six) hours as needed (for SOB AND wheezing every 6 hours AND PRN). What changed: reasons to take this   ketoconazole 2 % shampoo Commonly known as: NIZORAL Apply 1 application topically 3 (three) times a week.   levalbuterol 45 MCG/ACT inhaler Commonly known as: XOPENEX HFA Inhale 2 puffs every 6 hours as needed- rescue What changed:  how much to take how to take this when to take this reasons to take this additional instructions   losartan 25 MG tablet Commonly known as: COZAAR Take 1 tablet (25 mg total) by mouth daily. Please keep upcoming appt in April 2022 with Dr. Burt Knack before anymore refills. Thank you What changed:  when to take this additional instructions   OXYGEN Inhale 3-6 L into the lungs continuous. 3 at rest, 4-5 with exertion, 6 during shower   polyethylene glycol powder 17 GM/SCOOP powder Commonly known as: GLYCOLAX/MIRALAX Take 17 g by mouth every morning.   pravastatin 40 MG tablet Commonly known as: PRAVACHOL TAKE 1 TABLET(40 MG) BY MOUTH DAILY What changed: See the new instructions.   predniSONE 10 MG tablet Commonly known as: DELTASONE Take 4 tablets (40 mg total) by  mouth daily for 3 days, THEN 2 tablets (20 mg total) daily for 3 days, THEN 1 tablet (10 mg total) daily for 3 days, THEN 0.5 tablets (5 mg total) daily for 2 days. Start taking on: July 30, 2021   protein supplement shake Liqd Commonly known as: PREMIER PROTEIN Take 2 oz by mouth daily.   Systane 0.4-0.3 % Soln Generic drug: Polyethyl Glycol-Propyl Glycol Place 1 drop into both eyes 2 (two) times daily as needed (dry eyes).   tamsulosin 0.4 MG Caps capsule Commonly known as: FLOMAX Take two capsules by mouth once daily. What changed:  how much to take when to take this additional  instructions   vitamin B-12 1000 MCG tablet Commonly known as: CYANOCOBALAMIN Take 1 tablet (1,000 mcg total) by mouth daily. What changed: when to take this        Follow-up Information     Reed, Tiffany L, DO Follow up in 1 week(s).   Specialty: Geriatric Medicine Why: Hospital follow up Contact information: [place] [address] [phone]         Sherren Mocha, MD .   Specialty: Cardiology Contact information: [address] [place] [address] [phone]         Deneise Lever, MD Follow up.   Specialty: Pulmonary Disease Why: as scheduled Contact information: [address] [place] [address] [phone]                Allergies  Allergen Reactions   Tolectin [Tolmetin Sodium] Other (See Comments)    BP low and passed out   Montelukast Sodium Other (See Comments)    Unknown reaction   Nsaids Other (See Comments)    MD told pt not to use   Augmentin [Amoxicillin-Pot Clavulanate] Nausea And Vomiting    Did it involve swelling of the face/tongue/throat, SOB, or low BP? No Did it involve sudden or severe rash/hives, skin peeling, or any reaction on the inside of your mouth or nose? No Did you need to seek medical attention at a hospital or doctor's office? No When did it last happen? Last year   If all above answers are "NO", may proceed with cephalosporin use.   Neosporin [Bacitracin-Polymyxin B] Rash    Procedures/Studies: DG Chest 2 View  Result Date: 07/22/2021 CLINICAL DATA:  COPD exacerbation EXAM: CHEST - 2 VIEW COMPARISON:  02/18/2021 FINDINGS: Cardiac shadow is within normal limits. Aortic calcifications are noted. Lungs are hyperinflated consistent with COPD. Scarring in the right base is noted. Diffuse increased airspace opacity is noted in the right upper lobe extending towards the pleural margin consistent with acute infiltrate. No acute bony abnormality is noted. Stable  compression deformity in lower thoracic spine is noted. IMPRESSION: Right upper lobe infiltrate. Followup PA and lateral chest X-ray is recommended in 3-4 weeks following trial of antibiotic therapy to ensure resolution and exclude underlying malignancy. Electronically Signed   By: Inez Catalina M.D.   On: 07/22/2021 02:44   CT Angio Chest PE W and/or Wo Contrast  Result Date: 07/27/2021 CLINICAL DATA:  Pneumonia, hypoxia. EXAM: CT ANGIOGRAPHY CHEST WITH CONTRAST TECHNIQUE: Multidetector CT imaging of the chest was performed using the standard protocol during bolus administration of intravenous contrast. Multiplanar CT image reconstructions and MIPs were obtained to evaluate the vascular anatomy. CONTRAST:  96mL OMNIPAQUE IOHEXOL 350 MG/ML SOLN COMPARISON:  February 28, 2019. FINDINGS: Cardiovascular: Small filling defect is seen in a peripheral branch of the left  pulmonary artery in lingular segment of left upper lobe. Atherosclerosis of thoracic aorta is noted without aneurysm or dissection. Normal cardiac size. No pericardial effusion. Mediastinum/Nodes: Small sliding-type hiatal hernia is noted. No adenopathy is noted. Thyroid gland is unremarkable. Lungs/Pleura: No pneumothorax or pleural effusion is noted. Emphysematous disease is noted. Right upper and lower lobe airspace opacities are noted concerning for pneumonia. Upper Abdomen: No acute abnormality. Musculoskeletal: No chest wall abnormality. No acute or significant osseous findings. Review of the MIP images confirms the above findings. IMPRESSION: Small peripheral pulmonary embolus is noted in upper lobe branch of left pulmonary artery. Critical Value/emergent results were called by telephone at the time of interpretation on 07/27/2021 at 1:58 pm to provider ADAM CURATOLO , who verbally acknowledged these results. Large airspace opacity is noted in right upper and lower lobes concerning for pneumonia. Small sliding-type hiatal hernia. Aortic  Atherosclerosis (ICD10-I70.0) and Emphysema (ICD10-J43.9). Electronically Signed   By: Marijo Conception M.D.   On: 07/27/2021 13:58   DG Chest Portable 1 View  Result Date: 07/27/2021 CLINICAL DATA:  Recent diagnosis of pneumonia with persistent dyspnea and weakness and cough EXAM: PORTABLE CHEST 1 VIEW COMPARISON:  07/21/2021 chest radiograph. FINDINGS: Stable cardiomediastinal silhouette with normal heart size. No pneumothorax. No pleural effusion. Hyperinflated lungs. Emphysema. Worsening patchy consolidation in the upper right lung. Clear left lung. IMPRESSION: 1. Worsening patchy consolidation in the upper right lung. While potentially due to worsening pneumonia, an underlying central upper right lung mass is not excluded. Suggest further evaluation with chest CT with IV contrast. 2. Hyperinflated lungs and emphysema, suggesting COPD. Electronically Signed   By: Ilona Sorrel M.D.   On: 07/27/2021 12:32   DG Swallowing Func-Speech Pathology  Result Date: 07/28/2021 Table formatting from the original result was not included. Objective Swallowing Evaluation: Type of Study: MBS-Modified Barium Swallow Study  Patient Details Name: EKAM BESSON MRN: [id number] Date of Birth: 03-Oct-1934 Today's Date: 07/28/2021 Time: SLP Start Time (ACUTE ONLY): 1740 -SLP Stop Time (ACUTE ONLY): 8144 SLP Time Calculation (min) (ACUTE ONLY): 30 min Past Medical History: Past Medical History: Diagnosis Date  Allergic rhinitis, cause unspecified   Arthritis   COPD (chronic obstructive pulmonary disease) ([place])   Dyspnea   Dysrhythmia   "1 episode of tachycardia in 1980's, been on it ever since"  Emphysema of lung ([place])   Hypertension   Left inguinal hernia 02/07/2019  Low hemoglobin   low level  Neuromuscular disorder ([place])   Neuropathy   feet  Pneumonia   History of, last Oct 2019  Pulmonary nodule  Past Surgical History: Past Surgical History: Procedure Laterality Date  [place]  2003   performed at [facility], Dr. Fransico Him  CATARACT EXTRACTION  2008  Dr. Darleen Crocker  CATARACT EXTRACTION, BILATERAL    EYE SURGERY    INGUINAL HERNIA REPAIR Left 02/07/2019  Procedure: [place];  Surgeon: Fanny Skates, MD;  Location: [facility] [place];  Service: General;  Laterality: Left;  SPINAL AND TAP BLOCK ANESTHESIA  ROTATOR CUFF REPAIR  2002  Dr. Kathryne Hitch  TONSILLECTOMY  1945  VASECTOMY   HPI: Pt is a n 86 yo male adm to hospital - with respiratory issues - diagnosed with pna.  Pt reports being recently treated with ABX, steroid approx 4 weeks ago and more recently by Dr. Keturah Barre.  Large airspace opacity is noted in right upper and lower lobes concerning for pneumonia, small sliding hiatal hernia  No prior swallow evals.  Pt admits to having problems with "feeling full" pointing to proximal esophagus and having "gas".  He denies dysphagia.  Pt failed [facility] swallow screen and MBS ordered.  Subjective: pt awake in chair  Recommendations for follow up therapy are one component of a multi-disciplinary discharge planning process, led by the attending physician.  Recommendations may be updated based on patient status, additional functional criteria and insurance authorization. Assessment / Plan / Recommendation Clinical Impressions 07/28/2021 Clinical Impression Patient presents with mild dysphagia with suspected oral compensation due to his COPD c/b lingual pumping, prolonged oral transiting with lingual pumping with liquids. Oral hold may aid voluntary swallow control and increases time for breathing, thus may improve breathing and increase post-swallow exhalation.  Pharyngeal swallow is overall strong and timely with initial laryngeal penetration of thin via cup *with first swallow of the study*.   Aspiration of lateral channels secretion and barium retention observed (suspect of nectar liquids pt previously had swallowed) as retention spilled into open larynx while pudding barium was  at vallecular space.  This did not recur across all further boluses.  Suspect pt has level of chronic low grade aspiration for which mitigation strategies indicated. In addition, pt appears with prominent CP/UES - developing CP bar causing SLP to suspect excessive pressure in the esophagus.  Upon esophageal sweep, pt appeared with barium lining his esophagus.  Recommend pt follow strict esophageal/reflux precautions.  In addition, pt required frequent rest breaks during exam  and thus swallow/respiratory dysynchrony may allow episodic  aspiration.  Using teach back, educated son, Liliane Channel, to findings/recommendations using video loops. Pt had been taken upstairs.  Will follow up briefly and  consider RMST if pt will participate. SLP Visit Diagnosis Dysphagia, unspecified (R13.10);Dysphagia, oral phase (R13.11) Attention and concentration deficit following -- Frontal lobe and executive function deficit following -- Impact on safety and function Mild aspiration risk   Treatment Recommendations 07/28/2021 Treatment Recommendations Therapy as outlined in treatment plan below   Prognosis 07/28/2021 Prognosis for Safe Diet Advancement Fair Barriers to Reach Goals Time post onset Barriers/Prognosis Comment -- Diet Recommendations 07/28/2021 SLP Diet Recommendations Dysphagia 3 (Mech soft) solids;Thin liquid Liquid Administration via Cup Medication Administration Other (Comment) Compensations Slow rate;Small sips/bites Postural Changes Remain semi-upright after after feeds/meals (Comment);Seated upright at 90 degrees   Other Recommendations 07/28/2021 Recommended Consults -- Oral Care Recommendations Oral care QID Other Recommendations Have oral suction available Follow Up Recommendations -- Assistance recommended at discharge Intermittent Supervision/Assistance Functional Status Assessment Patient has had a recent decline in their functional status and demonstrates the ability to make significant improvements in function in a  reasonable and predictable amount of time. Frequency and Duration  07/28/2021 Speech Therapy Frequency (ACUTE ONLY) min 1 x/week Treatment Duration 2 weeks   Oral Phase 07/28/2021 Oral Phase Impaired Oral - Pudding Teaspoon -- Oral - Pudding Cup -- Oral - Honey Teaspoon -- Oral - Honey Cup -- Oral - Nectar Teaspoon -- Oral - Nectar Cup Lingual pumping;Delayed oral transit;Premature spillage Oral - Nectar Straw -- Oral - Thin Teaspoon -- Oral - Thin Cup Delayed oral transit;Lingual pumping Oral - Thin Straw Lingual pumping;Delayed oral transit Oral - Puree Lingual pumping;Delayed oral transit Oral - Mech Soft Lingual pumping;Delayed oral transit Oral - Regular -- Oral - Multi-Consistency -- Oral - Pill Lingual pumping;Reduced posterior propulsion Oral Phase - Comment Pt  Pharyngeal Phase 07/28/2021 Pharyngeal Phase Impaired Pharyngeal- Pudding Teaspoon -- Pharyngeal -- Pharyngeal- Pudding Cup -- Pharyngeal -- Pharyngeal- Honey Teaspoon --  Pharyngeal -- Pharyngeal- Honey Cup -- Pharyngeal -- Pharyngeal- Nectar Teaspoon -- Pharyngeal -- Pharyngeal- Nectar Cup [facility] Pharyngeal Material does not enter airway Pharyngeal- Nectar Straw -- Pharyngeal -- Pharyngeal- Thin Teaspoon -- Pharyngeal -- Pharyngeal- Thin Cup WFL;Penetration/Aspiration during swallow Pharyngeal Material does not enter airway Pharyngeal- Thin Straw WFL Pharyngeal Material does not enter airway Pharyngeal- Puree WFL Pharyngeal Material does not enter airway Pharyngeal- Mechanical Soft WFL Pharyngeal Material does not enter airway Pharyngeal- Regular -- Pharyngeal -- Pharyngeal- Multi-consistency -- Pharyngeal -- Pharyngeal- Pill Reduced epiglottic inversion;Pharyngeal residue - valleculae Pharyngeal Material does not enter airway Pharyngeal Comment barium tablet stalled at vallecular region when taken with thin with pt awareness, pudding transited it into cervical esophagus - where it appeared to halt above UES - more liquids facilitated clearance into  esophagus, prolonged oral transiting is self created compensations strategy likely due to pt's COPD  Cervical Esophageal Phase  07/28/2021 Cervical Esophageal Phase Impaired Pudding Teaspoon -- Pudding Cup -- Honey Teaspoon -- Honey Cup -- Nectar Teaspoon -- Nectar Cup Prominent cricopharyngeal segment Nectar Straw -- Thin Teaspoon -- Thin Cup Prominent cricopharyngeal segment Thin Straw Prominent cricopharyngeal segment Puree Prominent cricopharyngeal segment Mechanical Soft Prominent cricopharyngeal segment Regular Reduced cricopharyngeal relaxation;Prominent cricopharyngeal segment Multi-consistency -- Pill -- Cervical Esophageal Comment -- Kathleen Lime, MS [facility] SLP Acute Rehab Services Office [phone] Pager [phone] Macario Golds 07/28/2021, 4:21 PM                     ECHOCARDIOGRAM COMPLETE  Result Date: 07/28/2021    ECHOCARDIOGRAM REPORT   Patient Name:   JIMEL MYLER Date of Exam: 07/28/2021 Medical Rec #:  [id number]        Height:       69.0 in Accession #:    [id number]       Weight:       133.5 lb Date of Birth:  Jul 06, 1935        BSA:          1.740 m Patient Age:    47 years         BP:           155/81 mmHg Patient Gender: M                HR:           93 bpm. Exam Location:  Inpatient Procedure: 2D Echo, Cardiac Doppler and Color Doppler Indications:    Pulmonary Emblous  History:        Patient has prior history of Echocardiogram examinations, most                 recent 09/28/2019. CAD, COPD; Risk Factors:Hypertension.  Sonographer:    Glo Herring Referring Phys: [id number] DANIEL V THOMPSON  Sonographer Comments: No apical window. Limited echo due to poor acoustic windows IMPRESSIONS  1. Left ventricular ejection fraction, by estimation, is 60 to 65%. The left ventricle has normal function. The left ventricle has no regional wall motion abnormalities. Left ventricular diastolic function could not be evaluated.  2. Right ventricular systolic function is normal. The right ventricular  size is moderately enlarged. There is mildly elevated pulmonary artery systolic pressure.  3. Right atrial size was mildly dilated.  4. The mitral valve is grossly normal. No evidence of mitral valve regurgitation.  5. Tricuspid valve regurgitation is mild to moderate.  6. The aortic valve is tricuspid. Aortic valve regurgitation is not visualized. No aortic stenosis is present.  7. The inferior vena cava is normal in size with greater than 50% respiratory variability, suggesting right atrial pressure of 3 mmHg. Comparison(s): No significant change from prior study. Prior images reviewed side by side. FINDINGS  Left Ventricle: Left ventricular ejection fraction, by estimation, is 60 to 65%. The left ventricle has normal function. The left ventricle has no regional wall motion abnormalities. The left ventricular internal cavity size was normal in size. There is  no left ventricular hypertrophy. Left ventricular diastolic function could not be evaluated. Right Ventricle: The right ventricular size is moderately enlarged. No increase in right ventricular wall thickness. Right ventricular systolic function is normal. There is mildly elevated pulmonary artery systolic pressure. The tricuspid regurgitant velocity is 3.12 m/s, and with an assumed right atrial pressure of 3 mmHg, the estimated right ventricular systolic pressure is 87.8 mmHg. Left Atrium: Left atrial size was normal in size. Right Atrium: Right atrial size was mildly dilated. Pericardium: There is no evidence of pericardial effusion. Mitral Valve: The mitral valve is grossly normal. No evidence of mitral valve regurgitation. Tricuspid Valve: The tricuspid valve is normal in structure. Tricuspid valve regurgitation is mild to moderate. Aortic Valve: The aortic valve is tricuspid. Aortic valve regurgitation is not visualized. No aortic stenosis is present. Pulmonic Valve: The pulmonic valve was not well visualized. Pulmonic valve regurgitation is not  visualized. Aorta: The aortic root was not well visualized. Venous: The inferior vena cava is normal in size with greater than 50% respiratory variability, suggesting right atrial pressure of 3 mmHg. IAS/Shunts: No atrial level shunt detected by color flow Doppler.  LEFT VENTRICLE PLAX 2D LVIDd:         3.70 cm LVIDs:         2.20 cm LV PW:         0.90 cm LV IVS:        0.90 cm  LEFT ATRIUM         Index LA diam:    3.10 cm 1.78 cm/m  TRICUSPID VALVE TR Peak grad:   38.9 mmHg TR Vmax:        312.00 cm/s Dani Gobble Croitoru MD Electronically signed by Sanda Klein MD Signature Date/Time: 07/28/2021/1:57:27 PM    Final    VAS [place] LOWER EXTREMITY VENOUS (DVT)  Result Date: 07/28/2021  Lower Venous DVT Study Patient Name:  SHERVIN CYPERT  Date of Exam:   07/28/2021 Medical Rec #: [id number]         Accession #:    [id number] Date of Birth: [birth year]         Patient Gender: M Patient Age:   77 years Exam Location:  [facility] Procedure:      VAS [place] LOWER EXTREMITY VENOUS (DVT) Referring Phys: Irine Seal --------------------------------------------------------------------------------  Indications: Pulmonary embolism.  Comparison Study: Previous exam (LLEV) on 05/19/2018 was negative for DVT. Performing Technologist: Rogelia Rohrer RVT, RDMS  Examination Guidelines: A complete evaluation includes B-mode imaging, spectral Doppler, color Doppler, and power Doppler as needed of all accessible portions of each vessel. Bilateral testing is considered an integral part of a complete examination. Limited examinations for reoccurring indications may be performed as noted. The reflux portion of the exam is performed with the patient in reverse Trendelenburg.  +---------+---------------+---------+-----------+----------+--------------+  RIGHT     Compressibility Phasicity Spontaneity Properties Thrombus Aging  +---------+---------------+---------+-----------+----------+--------------+  CFV       Full            Yes  Yes                                    +---------+---------------+---------+-----------+----------+--------------+  SFJ       Full                                                             +---------+---------------+---------+-----------+----------+--------------+  FV Prox   Full            Yes       Yes                                    +---------+---------------+---------+-----------+----------+--------------+  FV Mid    Full            Yes       Yes                                    +---------+---------------+---------+-----------+----------+--------------+  FV Distal Full            Yes       Yes                                    +---------+---------------+---------+-----------+----------+--------------+  PFV       Full                                                             +---------+---------------+---------+-----------+----------+--------------+  POP       Full            Yes       Yes                                    +---------+---------------+---------+-----------+----------+--------------+  PTV       Full                                                             +---------+---------------+---------+-----------+----------+--------------+  PERO      Full                                                             +---------+---------------+---------+-----------+----------+--------------+   +---------+---------------+---------+-----------+----------+--------------+  LEFT      Compressibility Phasicity Spontaneity Properties Thrombus Aging  +---------+---------------+---------+-----------+----------+--------------+  CFV       Full  Yes       Yes                                    +---------+---------------+---------+-----------+----------+--------------+  SFJ       Full                                                             +---------+---------------+---------+-----------+----------+--------------+  FV Prox   Full            Yes       Yes                                     +---------+---------------+---------+-----------+----------+--------------+  FV Mid    Full            Yes       Yes                                    +---------+---------------+---------+-----------+----------+--------------+  FV Distal Full            Yes       Yes                                    +---------+---------------+---------+-----------+----------+--------------+  PFV       Full                                                             +---------+---------------+---------+-----------+----------+--------------+  POP       Full            Yes       Yes                                    +---------+---------------+---------+-----------+----------+--------------+  PTV       Full                                                             +---------+---------------+---------+-----------+----------+--------------+  PERO      Full                                                             +---------+---------------+---------+-----------+----------+--------------+     Summary: BILATERAL: - No evidence of deep vein thrombosis seen in the lower extremities, bilaterally. - No evidence of superficial venous thrombosis in the lower extremities,  bilaterally. -No evidence of popliteal cyst, bilaterally.   *See table(s) above for measurements and observations. Electronically signed by Deitra Mayo MD on 07/28/2021 at 1:56:09 PM.    Final     Subjective: Eager to go home  Discharge Exam: Vitals:   07/30/21 0854 07/30/21 1150  BP:  136/79  Pulse:  (!) 59  Resp:  20  Temp:  98.1 F (36.7 C)  SpO2: 93% 100%   Vitals:   07/30/21 0216 07/30/21 0336 07/30/21 0854 07/30/21 1150  BP:  135/63  136/79  Pulse:  77  (!) 59  Resp:  18  20  Temp:  97.9 F (36.6 C)  98.1 F (36.7 C)  TempSrc:  Oral  Oral  SpO2:  97% 93% 100%  Weight: 67.6 kg     Height:        General: Pt is alert, awake, not in acute distress Cardiovascular: RRR, S1/S2 + Respiratory: CTA bilaterally, no wheezing, no  rhonchi Abdominal: Soft, NT, ND, bowel sounds + Extremities: no edema, no cyanosis   The results of significant diagnostics from this hospitalization (including imaging, microbiology, ancillary and laboratory) are listed below for reference.     Microbiology: Recent Results (from the past 240 hour(s))  Blood culture (routine x 2)     Status: None (Preliminary result)   Collection Time: 07/27/21 12:00 PM   Specimen: BLOOD LEFT ARM  Result Value Ref Range Status   Specimen Description   Final    BLOOD LEFT ARM Performed at [facility], [address] [place], [place] [address]    Special Requests   Final    BOTTLES DRAWN AEROBIC AND ANAEROBIC Blood Culture results may not be optimal due to an excessive volume of blood received in culture bottles   Culture   Final    NO GROWTH 3 DAYS Performed at [facility], [place] [address] [place], [place] [address]    Report Status PENDING  Incomplete  Resp Panel by RT-PCR (Flu A&B, Covid) Nasopharyngeal Swab     Status: None   Collection Time: 07/27/21 12:16 PM   Specimen: Nasopharyngeal Swab; Nasopharyngeal(NP) swabs in vial transport medium  Result Value Ref Range Status   SARS Coronavirus 2 by RT PCR NEGATIVE NEGATIVE Final    Comment: (NOTE) SARS-CoV-2 target nucleic acids are NOT DETECTED.  The SARS-CoV-2 RNA is generally detectable in upper respiratory specimens during the acute phase of infection. The lowest concentration of SARS-CoV-2 viral copies this assay can detect is 138 copies/mL. A negative result does not preclude SARS-Cov-2 infection and should not be used as the sole basis for treatment or other patient management decisions. A negative result may occur with  improper specimen collection/handling, submission of specimen other than nasopharyngeal swab, presence of viral mutation(s) within the areas targeted by this assay, and inadequate number of viral copies(<138 copies/mL). A negative result  must be combined with clinical observations, patient history, and epidemiological information. The expected result is Negative.  Fact Sheet for Patients:  EntrepreneurPulse.com.au  Fact Sheet for Healthcare Providers:  IncredibleEmployment.be  This test is no t yet approved or cleared by the [place] FDA and  has been authorized for detection and/or diagnosis of SARS-CoV-2 by FDA under an Emergency Use Authorization (EUA). This EUA will remain  in effect (meaning this test can be used) for the duration of the COVID-19 declaration under Section 564(b)(1) of the Act, 21 U.S.C.section 360bbb-3(b)(1), unless the authorization is terminated  or revoked sooner.  Influenza A by PCR NEGATIVE NEGATIVE Final   Influenza B by PCR NEGATIVE NEGATIVE Final    Comment: (NOTE) The Xpert Xpress SARS-CoV-2/FLU/RSV plus assay is intended as an aid in the diagnosis of influenza from Nasopharyngeal swab specimens and should not be used as a sole basis for treatment. Nasal washings and aspirates are unacceptable for Xpert Xpress SARS-CoV-2/FLU/RSV testing.  Fact Sheet for Patients: EntrepreneurPulse.com.au  Fact Sheet for Healthcare Providers: IncredibleEmployment.be  This test is not yet approved or cleared by the [place] FDA and has been authorized for detection and/or diagnosis of SARS-CoV-2 by FDA under an Emergency Use Authorization (EUA). This EUA will remain in effect (meaning this test can be used) for the duration of the COVID-19 declaration under Section 564(b)(1) of the Act, 21 U.S.C. section 360bbb-3(b)(1), unless the authorization is terminated or revoked.  Performed at [facility], [address], [place], [place] [address]   Blood culture (routine x 2)     Status: None (Preliminary result)   Collection Time: 07/27/21  2:16 PM   Specimen: BLOOD RIGHT WRIST  Result Value  Ref Range Status   Specimen Description   Final    BLOOD RIGHT WRIST Performed at [facility], [address] [place], [place] [address]    Special Requests   Final    BOTTLES DRAWN AEROBIC AND ANAEROBIC Blood Culture adequate volume   Culture   Final    NO GROWTH 3 DAYS Performed at [place] [address] [place], [place] [address]    Report Status PENDING  Incomplete  MRSA Next Gen by PCR, Nasal     Status: None   Collection Time: 07/27/21  6:53 PM   Specimen: Nasal Mucosa; Nasal Swab  Result Value Ref Range Status   MRSA by PCR Next Gen NOT DETECTED NOT DETECTED Final    Comment: (NOTE) The GeneXpert MRSA Assay (FDA approved for NASAL specimens only), is one component of a comprehensive MRSA colonization surveillance program. It is not intended to diagnose MRSA infection nor to guide or monitor treatment for MRSA infections. Test performance is not FDA approved in patients less than 37 years old. Performed at [facility], [place] [address], [place], [place] [address]   Urine Culture     Status: None   Collection Time: 07/27/21  8:32 PM   Specimen: Urine, Clean Catch  Result Value Ref Range Status   Specimen Description   Final    URINE, CLEAN CATCH Performed at [facility], [place] [address] [place], [place] [address]    Special Requests   Final    NONE Performed at [facility], [place] [address] [place], [place] [address]    Culture   Final    NO GROWTH Performed at [facility], [place] [address] [place], [place] [address]    Report Status 07/29/2021 FINAL  Final     Labs: BNP (last 3 results) Recent Labs    07/27/21 1213  BNP 628.3*   Basic Metabolic Panel: Recent Labs  Lab 07/27/21 1216 07/28/21 0354 07/29/21 0348  NA 135 134* 136  K 4.5 4.3 4.3  CL 95* 99 103  CO2 34* 28 27  GLUCOSE 139* 151* 161*  BUN 14 17 21   CREATININE 0.44* 0.49* 0.44*  CALCIUM 9.1  8.0* 8.1*  MG  --  1.9 2.1  PHOS  --  3.5  --    Liver Function Tests: Recent Labs  Lab 07/28/21 0354  AST 18  ALT 23  ALKPHOS 43  BILITOT 0.5  PROT 5.2*  ALBUMIN 2.1*   No results for input(s): LIPASE, AMYLASE in the last 168 hours. No results for input(s): AMMONIA in the last 168 hours. CBC: Recent Labs  Lab 07/27/21 1216 07/28/21 0354 07/29/21 0348 07/30/21 0334  WBC 13.3* 11.2* 13.6* 12.7*  NEUTROABS 11.8* 10.2*  --   --   HGB 11.9* 10.6* 9.8* 10.5*  HCT 37.7* 32.5* 31.0* 33.7*  MCV 88.3 87.4 89.1 92.3  PLT 354 287 325 292   Cardiac Enzymes: No results for input(s): CKTOTAL, CKMB, CKMBINDEX, TROPONINI in the last 168 hours. BNP: Invalid input(s): POCBNP CBG: Recent Labs  Lab 07/28/21 0725 07/29/21 0417 07/29/21 0720 07/30/21 0801  GLUCAP 130* 148* 139* 126*   D-Dimer No results for input(s): DDIMER in the last 72 hours. Hgb A1c No results for input(s): HGBA1C in the last 72 hours. Lipid Profile No results for input(s): CHOL, HDL, LDLCALC, TRIG, CHOLHDL, LDLDIRECT in the last 72 hours. Thyroid function studies No results for input(s): TSH, T4TOTAL, T3FREE, THYROIDAB in the last 72 hours.  Invalid input(s): FREET3 Anemia work up No results for input(s): VITAMINB12, FOLATE, FERRITIN, TIBC, IRON, RETICCTPCT in the last 72 hours. Urinalysis    Component Value Date/Time   COLORURINE YELLOW 07/27/2021 2032   APPEARANCEUR CLEAR 07/27/2021 2032   LABSPEC 1.025 07/27/2021 2032   PHURINE 6.0 07/27/2021 2032   GLUCOSEU NEGATIVE 07/27/2021 2032   HGBUR NEGATIVE 07/27/2021 2032   BILIRUBINUR NEGATIVE 07/27/2021 2032   KETONESUR 20 (A) 07/27/2021 2032   PROTEINUR 30 (A) 07/27/2021 2032   NITRITE NEGATIVE 07/27/2021 2032   LEUKOCYTESUR NEGATIVE 07/27/2021 2032   Sepsis Labs Invalid input(s): PROCALCITONIN,  WBC,  LACTICIDVEN Microbiology Recent Results (from the past 240 hour(s))  Blood culture (routine x 2)     Status: None (Preliminary result)    Collection Time: 07/27/21 12:00 PM   Specimen: BLOOD LEFT ARM  Result Value Ref Range Status   Specimen Description   Final    BLOOD LEFT ARM Performed at [facility], [address], [place], [place] [address]    Special Requests   Final    BOTTLES DRAWN AEROBIC AND ANAEROBIC Blood Culture results may not be optimal due to an excessive volume of blood received in culture bottles   Culture   Final    NO GROWTH 3 DAYS Performed at [facility], [place] [address], [place], [place] [address]    Report Status PENDING  Incomplete  Resp Panel by RT-PCR (Flu A&B, Covid) Nasopharyngeal Swab     Status: None   Collection Time: 07/27/21 12:16 PM   Specimen: Nasopharyngeal Swab; Nasopharyngeal(NP) swabs in vial transport medium  Result Value Ref Range Status   SARS Coronavirus 2 by RT PCR NEGATIVE NEGATIVE Final    Comment: (NOTE) SARS-CoV-2 target nucleic acids are NOT DETECTED.  The SARS-CoV-2 RNA is generally detectable in upper respiratory specimens during the acute phase of infection. The lowest concentration of SARS-CoV-2 viral copies this assay can detect is 138 copies/mL. A negative result does not preclude SARS-Cov-2 infection and should not be used as the sole basis for treatment or other patient management decisions. A negative result may occur with  improper specimen collection/handling, submission of specimen other than nasopharyngeal swab, presence of viral mutation(s) within the areas targeted by this assay, and inadequate number of viral copies(<138 copies/mL). A negative result must be combined with clinical observations,  patient history, and epidemiological information. The expected result is Negative.  Fact Sheet for Patients:  EntrepreneurPulse.com.au  Fact Sheet for Healthcare Providers:  IncredibleEmployment.be  This test is no t yet approved or cleared by the [place] FDA and  has been authorized for  detection and/or diagnosis of SARS-CoV-2 by FDA under an Emergency Use Authorization (EUA). This EUA will remain  in effect (meaning this test can be used) for the duration of the COVID-19 declaration under Section 564(b)(1) of the Act, 21 U.S.C.section 360bbb-3(b)(1), unless the authorization is terminated  or revoked sooner.       Influenza A by PCR NEGATIVE NEGATIVE Final   Influenza B by PCR NEGATIVE NEGATIVE Final    Comment: (NOTE) The Xpert Xpress SARS-CoV-2/FLU/RSV plus assay is intended as an aid in the diagnosis of influenza from Nasopharyngeal swab specimens and should not be used as a sole basis for treatment. Nasal washings and aspirates are unacceptable for Xpert Xpress SARS-CoV-2/FLU/RSV testing.  Fact Sheet for Patients: EntrepreneurPulse.com.au  Fact Sheet for Healthcare Providers: IncredibleEmployment.be  This test is not yet approved or cleared by the [place] FDA and has been authorized for detection and/or diagnosis of SARS-CoV-2 by FDA under an Emergency Use Authorization (EUA). This EUA will remain in effect (meaning this test can be used) for the duration of the COVID-19 declaration under Section 564(b)(1) of the Act, 21 U.S.C. section 360bbb-3(b)(1), unless the authorization is terminated or revoked.  Performed at [facility], [address], [place], [place] [address]   Blood culture (routine x 2)     Status: None (Preliminary result)   Collection Time: 07/27/21  2:16 PM   Specimen: BLOOD RIGHT WRIST  Result Value Ref Range Status   Specimen Description   Final    BLOOD RIGHT WRIST Performed at [facility], [address], [place], [place] [address]    Special Requests   Final    BOTTLES DRAWN AEROBIC AND ANAEROBIC Blood Culture adequate volume   Culture   Final    NO GROWTH 3 DAYS Performed at [place] [address], [place], [place] [address]     Report Status PENDING  Incomplete  MRSA Next Gen by PCR, Nasal     Status: None   Collection Time: 07/27/21  6:53 PM   Specimen: Nasal Mucosa; Nasal Swab  Result Value Ref Range Status   MRSA by PCR Next Gen NOT DETECTED NOT DETECTED Final    Comment: (NOTE) The GeneXpert MRSA Assay (FDA approved for NASAL specimens only), is one component of a comprehensive MRSA colonization surveillance program. It is not intended to diagnose MRSA infection nor to guide or monitor treatment for MRSA infections. Test performance is not FDA approved in patients less than 19 years old. Performed at [facility], [place] [address] [place], [place] [address]   Urine Culture     Status: None   Collection Time: 07/27/21  8:32 PM   Specimen: Urine, Clean Catch  Result Value Ref Range Status   Specimen Description   Final    URINE, CLEAN CATCH Performed at [facility], [place] [address], [place], [place] [address]    Special Requests   Final    NONE Performed at [facility], [place] [address], [place], [place] [address]    Culture   Final    NO GROWTH Performed at [facility], [place] [address], [place], [place] [address]    Report  Status 07/29/2021 FINAL  Final   Time spent: 30 min  SIGNED:   Marylu Lund, MD  [facility] 07/30/2021, 1:10 PM  If 7PM-7AM, please contact night-coverage

## 2021-07-30 NOTE — Progress Notes (Addendum)
Pharmacy Antibiotic Note  ODYN TURKO is a 86 y.o. male admitted on 07/27/2021 with pneumonia.  Pharmacy has been consulted for vanc/cefepime dosing. Vanc has since been d/c'd and zithromax added x 5 days  Day 3 Cefepime Afebrile WBC improved SCr stable  Plan: Continue cefepime 2g IV q8 per current renal function   Height: 5\' 9"  (175.3 cm) Weight: 67.6 kg (149 lb 1.6 oz) IBW/kg (Calculated) : 70.7  Temp (24hrs), Avg:98.1 F (36.7 C), Min:97.9 F (36.6 C), Max:98.5 F (36.9 C)  Recent Labs  Lab 07/27/21 1216 07/27/21 1400 07/28/21 0354 07/29/21 0348 07/30/21 0334  WBC 13.3*  --  11.2* 13.6* 12.7*  CREATININE 0.44*  --  0.49* 0.44*  --   LATICACIDVEN  --  1.4  --   --   --      Estimated Creatinine Clearance: 65.7 mL/min (A) (by C-G formula based on SCr of 0.44 mg/dL (L)).    Allergies  Allergen Reactions   Tolectin [Tolmetin Sodium] Other (See Comments)    BP low and passed out   Montelukast Sodium Other (See Comments)    Unknown reaction   Nsaids Other (See Comments)    MD told pt not to use   Augmentin [Amoxicillin-Pot Clavulanate] Nausea And Vomiting    Did it involve swelling of the face/tongue/throat, SOB, or low BP? No Did it involve sudden or severe rash/hives, skin peeling, or any reaction on the inside of your mouth or nose? No Did you need to seek medical attention at a hospital or doctor's office? No When did it last happen? Last year   If all above answers are "NO", may proceed with cephalosporin use.   Neosporin [Bacitracin-Polymyxin B] Rash     Thank you for allowing pharmacy to be a part of this patients care.  Kara Mead 07/30/2021 7:50 AM

## 2021-07-30 NOTE — Plan of Care (Signed)
  Problem: Education: Goal: Knowledge of General Education information will improve Description: Including pain rating scale, medication(s)/side effects and non-pharmacologic comfort measures Outcome: Adequate for Discharge   

## 2021-07-31 ENCOUNTER — Other Ambulatory Visit: Payer: Self-pay | Admitting: *Deleted

## 2021-07-31 ENCOUNTER — Encounter: Payer: Self-pay | Admitting: *Deleted

## 2021-07-31 NOTE — Patient Instructions (Signed)
Visit Information  Thank you for taking time to visit with me today. Please don't hesitate to contact me if I can be of assistance to you before our next scheduled telephone appointment.  Following are the goals we discussed today:  Complete antibiotic and steroids  Follow up with PCP and Pulmonology Follow up with [facility] next appointment is by telephone in 2 weeks    Patient verbalizes understanding of instructions provided today and agrees to view in [place].   The patient has been provided with contact information for the care management team and has been advised to call with any health related questions or concerns.   [name], RN, MSN, [place] Manager [phone]

## 2021-07-31 NOTE — Patient Outreach (Signed)
[facility]) Care Management  [place]  07/31/2021   KNOXX BOEDING 1935/08/14 [id number]   Referral Date: 12/16 Referral Source: Hospital liaison  Referral Reason: Post discharge Insurance: Traditional Medicare   Outreach attempt #1, successful.  Identity verified.  This care manager introduced self and stated purpose of call.  [facility] care management services explained.   Social: Lives with wife but has son in the area for support.  They are paying out of pocket for in home personal care assistance.  Before hospitalization, aide would come in 2 days a week, that has since increased.  They have the next week covered for every day, working to have daily covered going forward.  This is an out of pocket cost for them.  [facility] has been ordered for PT, state this will start Sunday.    Conditions: Per chart, has history of COPD on home O2 at 3-4 LPM, CAD, HTN, HLD, and neuropathy.  Since discharge for COPD, has been using oxygen at 3 liters at rest and 4-6 liters with activity.  Medications: Reviewed with member,report taking as instructed.  Denies need for financial assistance.  State he is taking antibiotics, nebulizers, inhalers, and steroids as ordered.    Appointments: Will see Pulmonology on 1/12.  Was able to speak with PCP today, he will be notified of cancellation as soon as possible.    Advance Directives: Report son is POA, no desire to change at this time.  Consent: Member agrees to [facility] involvement and Care Plan.    Encounter Medications:  Outpatient Encounter Medications as of 07/31/2021  Medication Sig Note   acetaminophen (TYLENOL) 650 MG CR tablet Take 1,300 mg by mouth See admin instructions. Take 2 tablets (1300 mg) by mouth every morning, may also take 1-2 tablets ([phone] mg) at 4p-5p if needed for pain    apixaban (ELIQUIS) 5 MG TABS tablet Take 1-2 tablets (5-10 mg total) by mouth 2 (two) times daily. Take 2 tablets (10mg ) by mouth 2  (two) time daily for 6 days with first dose 12/15 PM then on 12/21 PM reduce dose to 1 tablet (5mg ) by mouth 2 (two) time daily    azithromycin (ZITHROMAX) 250 MG tablet Take 1 tablet by mouth daily.    BREZTRI AEROSPHERE 160-9-4.8 MCG/ACT AERO INHALE 2 PUFFS INTO THE LUNGS IN THE MORNING AND AT BEDTIME (Patient taking differently: Inhale 2 puffs into the lungs in the morning and at bedtime.)    cefdinir (OMNICEF) 300 MG capsule Take 1 capsule (300 mg total) by mouth 2 (two) times daily for 3 days.    ipratropium-albuterol (DUONEB) 0.5-2.5 (3) MG/3ML SOLN Take 3 mLs by nebulization every 6 (six) hours as needed (for SOB AND wheezing every 6 hours AND PRN). (Patient taking differently: Take 3 mLs by nebulization every 6 (six) hours as needed (wheezing/shortness of breath).) 07/27/2021: Pt states that nebulizer treatments do not help   ketoconazole (NIZORAL) 2 % shampoo Apply 1 application topically 3 (three) times a week.    levalbuterol (XOPENEX HFA) 45 MCG/ACT inhaler Inhale 2 puffs every 6 hours as needed- rescue (Patient taking differently: Inhale 2 puffs into the lungs every 6 (six) hours as needed for wheezing or shortness of breath.)    losartan (COZAAR) 25 MG tablet Take 1 tablet (25 mg total) by mouth daily. Please keep upcoming appt in April 2022 with Dr. Burt Knack before anymore refills. Thank you (Patient taking differently: Take 25 mg by mouth every morning.)    Nutritional  Supplements (ENSURE HIGH PROTEIN) LIQD Take 5.5-11 oz by mouth daily.    OXYGEN Inhale 3-6 L into the lungs continuous. 3 at rest, 4-5 with exertion, 6 during shower    Polyethyl Glycol-Propyl Glycol (SYSTANE) 0.4-0.3 % SOLN Place 1 drop into both eyes 2 (two) times daily as needed (dry eyes).    polyethylene glycol powder (GLYCOLAX/MIRALAX) powder Take 17 g by mouth every morning.    pravastatin (PRAVACHOL) 40 MG tablet TAKE 1 TABLET(40 MG) BY MOUTH DAILY (Patient taking differently: Take 40 mg by mouth every evening.)     predniSONE (DELTASONE) 10 MG tablet Take 4 tablets (40 mg total) by mouth daily for 3 days, THEN 2 tablets (20 mg total) daily for 3 days, THEN 1 tablet (10 mg total) daily for 3 days, THEN 0.5 tablets (5 mg total) daily for 2 days.    tamsulosin (FLOMAX) 0.4 MG CAPS capsule Take two capsules by mouth once daily. (Patient taking differently: 0.4 mg every evening.)    vitamin B-12 (CYANOCOBALAMIN) 1000 MCG tablet Take 1 tablet (1,000 mcg total) by mouth daily. (Patient taking differently: Take 1,000 mcg by mouth daily with lunch.)    DULoxetine (CYMBALTA) 20 MG capsule Take 1 capsule (20 mg total) by mouth daily. (Patient not taking: Reported on 07/27/2021) 07/27/2021: Prescribed for neuropathy - pt took a couple capsules and wasn't sure he was tolerating it well. MD and pt decided that they would wait until he was well again to retry.   protein supplement shake (PREMIER PROTEIN) LIQD Take 2 oz by mouth daily. (Patient not taking: Reported on 07/27/2021)    No facility-administered encounter medications on file as of 07/31/2021.    Functional Status:  In your present state of health, do you have any difficulty performing the following activities: 07/27/2021  Hearing? N  Vision? N  Difficulty concentrating or making decisions? N  Walking or climbing stairs? Y  Comment pt has a stairlift  Dressing or bathing? N  Doing errands, shopping? Y  Some recent data might be hidden    Fall/Depression Screening: Fall Risk  07/31/2021 05/12/2021 01/09/2021  Falls in the past year? 0 0 0  Number falls in past yr: 0 0 0  Injury with Fall? 0 0 0  Comment - - -  Risk for fall due to : - Impaired mobility;Other (Comment);Impaired balance/gait Other (Comment)  Risk for fall due to: Comment - imparied mobility due to respiratory issues -  Follow up Falls evaluation completed Falls prevention discussed;Education provided;Falls evaluation completed Falls evaluation completed;Falls prevention discussed   PHQ 2/9  Scores 07/31/2021 05/12/2021 01/09/2021 09/25/2020 11/07/2019 10/18/2019 09/20/2019  PHQ - 2 Score 0 0 0 0 0 0 0  PHQ- 9 Score - - - - 2 - -    Assessment:   Care Plan Care Plan : COPD (Adult)  Updates made by [name], RN since 07/31/2021 12:00 AM     Problem: Disease Progression (COPD)   Priority: High     Long-Range Goal: Disease Progression Minimized or Managed Completed 07/31/2021  Start Date: 05/12/2021  Expected End Date: 05/15/2022  Recent Progress: On track  Note:   Evidence-based guidance:  Identify current smoking/tobacco use; provide smoking cessation intervention.  Assess symptom control by the frequency and type of symptoms, reliever use and activity limitation at every encounter.  Assess risk for exacerbation (flare up) by evaluating spirometry, pulse oximetry, reliever use, presentation of symptoms and activity limitation; anticipate treatment adjustment based on risks and resources.  Develop  and/or review and reinforce use of COPD rescue (action) plan even when symptoms are controlled or infrequent.  Ask patient to bring inhaler to all visits; assess and reinforce correct technique; address barriers to proper inhaler use, such as older age, use of multiple devices and lack of understanding.   Identify symptom triggers, such as smoking, virus, weather change, emotional upset, exercise, obesity and environmental allergen; consider reduction of work-exposure versus elimination to avoid compromising employment.  Correlate presentation to comorbidity, such as diabetes, heart failure, obstructive sleep apnea, depression and anxiety, which may worsen symptoms.  Prepare for individualized pharmacologic therapy that may include LABA (long-acting beta-2 agonist), LAMA (long-acting muscarinic antagonist), SABA (short-acting beta-2 agonist) oral or inhaled corticosteroid.  Promote participation in pulmonary rehabilitation for breathing exercises, skills training, improved exercise  capacity, mood and quality of life; address barriers to participation.  Promote physical activity or exercise to improve or maintain exercise capacity, based on tolerance that may include walking, water exercise, cycling or limb muscle strength training.  Promote use of energy conservation and activity pacing techniques.  Promote use of breathing and coughing techniques, such as inspiratory muscle training, pursed-lip breathing, diaphragmatic breathing, pranayama yoga breathing or huff cough.  Screen for malnutrition risk factors, such as unintentional weight loss and poor oral intake; refer to dietitian if identified.  Consider recommendation for oral drink supplement or multivitamin and mineral supplements if suspect inadequate oral intake or micronutrient deficiencies.   Screen for obstructive sleep apnea; prepare patient for polysomnography based on risk and presentation.  Prepare patient for use of long-term oxygen and noninvasive ventilation to relieve hypercapnia, hypoxemia, obstructive sleep apnea and reduce work of breathing.  Prepare patient with worsening disease for surgical interventions that may include bronchoscopy, lung volume reduction surgery, bullectomy or lung transplantation.   Notes:   12/16 - Resolving due to duplicate goal     Task: Alleviate Barriers to COPD Management Completed 07/31/2021  Due Date: 05/15/2022  Note:   Care Management Activities:    - barriers to treatment managed - individualized medical nutrition therapy provided - medication-adherence assessment completed - rescue (action) plan reviewed - self-awareness of symptom triggers encouraged    Notes:     Care Plan : [place] (Adult)  Updates made by [name], RN since 07/31/2021 12:00 AM     Problem: Chronic care management of chronic medical condition, COPD   Priority: High     Long-Range Goal: Member will be able to verbalize adequate management of chronic health condition,  COPD   Start Date: 07/31/2021  Expected End Date: 01/27/2022  Priority: High  Note:   Current Barriers:  Chronic Disease Management support and education needs related to COPD  RNCM Clinical Goal(s):  Patient will verbalize understanding of plan for management of COPD as evidenced by ability to verbalize plan of care take all medications exactly as prescribed and will call provider for medication related questions as evidenced by reported adherence    attend all scheduled medical appointments: Pulmonary and PCP as evidenced by Reported attendance        continue to work with [place] and/or Social Worker to address care management and care coordination needs related to COPD as evidenced by adherence to CM Team Scheduled appointments     work with Home health PT to increase strength as evidenced by reported ability to adequately care for self not experience hospital admission as evidenced by review of EMR. Hospital Admissions in last 6 months = 1  through collaboration with Consulting civil engineer, provider, and care team.   Interventions: Inter-disciplinary care team collaboration (see longitudinal plan of care) Evaluation of current treatment plan related to  self management and patient's adherence to plan as established by provider   COPD: (Status: New goal.) Long Term Goal  Reviewed medications with patient, including use of prescribed maintenance and rescue inhalers, and provided instruction on medication management and the importance of adherence Provided patient with basic written and verbal COPD education on self care/management/and exacerbation prevention Advised patient to track and manage COPD triggers Advised patient to self assesses COPD action plan zone and make appointment with provider if in the yellow zone for 48 hours without improvement Screening for signs and symptoms of depression related to chronic disease state   Patient Goals/Self-Care Activities: Take medications as  prescribed   Attend all scheduled provider appointments Call provider office for new concerns or questions  - avoid second hand smoke - identify and remove indoor air pollutants - limit outdoor activity during cold weather - do breathing exercises every day - develop a rescue plan - do breathing exercises at least 2 times each day        Goals Addressed             This Visit's Progress    COMPLETED: (THN) Track and Manage My Symptoms-COPD       Timeframe:  Long-Range Goal Priority:  High Start Date:  05/12/21                           Expected End Date: 05/15/22                 Follow Up Date 08/14/21    - develop a rescue plan - eliminate symptom triggers at home - follow rescue plan if symptoms flare-up - keep follow-up appointments  -Discussed using DOUNEB first thing in the morning to open airways to get his day started and as needed -Encouraged patient to continue to walk around his home and to go out 2 times a week  as tolerated to maintain strength and enjoy the outdoors -Discussed increasing the amount of high protein Equated as needed to maintain nutrition and to increase the amount of fluid he drinks to stay hydrated -Discussed calling your provider's office to inform them you will need assistance getting into the building. Let them know you will call them when you arrive. They will come to your car with a wheelchair to transport you into the building   Why is this important?   Tracking your symptoms and other information about your health helps your doctor plan your care.  Write down the symptoms, the time of day, what you were doing and what medicine you are taking.  You will soon learn how to manage your symptoms.     Notes: 05/12/21: Patient feels that his COPD symptoms of SOB and fatigue have remained about the same since his last visit with Dr. Annamaria Boots on 02/18/21. Nurse will send patient COPD education with color zones and rescue action plans. Discussed goals as  written above.  12/16 - Resolving due to duplicate goal         Plan:  Follow-up: Patient agrees to Care Plan and Follow-up. Follow-up in 2 week(s).  [name], RN, MSN, [place] Manager [phone]

## 2021-08-01 LAB — CULTURE, BLOOD (ROUTINE X 2)
Culture: NO GROWTH
Culture: NO GROWTH
Special Requests: ADEQUATE

## 2021-08-03 ENCOUNTER — Other Ambulatory Visit: Payer: Self-pay | Admitting: *Deleted

## 2021-08-04 ENCOUNTER — Other Ambulatory Visit: Payer: Self-pay | Admitting: *Deleted

## 2021-08-04 NOTE — Patient Outreach (Signed)
[facility] [place] [facility]) Care Management  08/04/2021  TOBI LEINWEBER 1935-06-22 [id number]  Nurse opened encounter by error. Patient is currently being followed by [facility] CM.   Emelia Loron RN, [place] [phone] [email or web address]

## 2021-08-07 ENCOUNTER — Telehealth: Payer: Self-pay

## 2021-08-07 DIAGNOSIS — Z515 Encounter for palliative care: Secondary | ICD-10-CM

## 2021-08-07 NOTE — Telephone Encounter (Signed)
(  9:20 am) Per request of Dr. Mariea Clonts, SW completed a follow-up call to patient's son-Rick to provide resources and support to him. Liliane Channel provided some background on his parents and current medical status and needs. SW provided education to [place] regarding different levels of care to include respite care, and facilities that may fit his parents needs, as well as resources to allow patient to remain in the home. Liliane Channel expressed that it is his parent's goal to remain in their home for as long as possible. SW advised that she will email additional resources to him. SW also encouraged him to review the resources and call back for any additional questions/concerns.  *SW emailed resources to [employer].

## 2021-08-09 ENCOUNTER — Encounter: Payer: Self-pay | Admitting: Internal Medicine

## 2021-08-09 ENCOUNTER — Encounter: Payer: Self-pay | Admitting: Cardiovascular Disease

## 2021-08-10 ENCOUNTER — Telehealth: Payer: Self-pay

## 2021-08-10 NOTE — Telephone Encounter (Signed)
(  1:20 pm) SW received a call from patient. He was following up on SW conversation with his son. SW provided additional education and resources, pros and cons regarding senior living communities and supportive counseling. Patient verbalized understanding of education provided. No other concerns.

## 2021-08-10 NOTE — Telephone Encounter (Signed)
Dr. Young, please advise on pt's email. Thanks.  

## 2021-08-10 NOTE — Telephone Encounter (Signed)
NA

## 2021-08-11 NOTE — Telephone Encounter (Signed)
I'm sorry for the pneumonia. If feeling stable or better now, it would be reasonable to keep the appointment with me for January 12 and get the CXR at that visit.

## 2021-08-11 NOTE — Telephone Encounter (Signed)
Patient called and stated he's still coughing up flem and blood, SOB, wants to know how long it will take until he starts to feel better and wants to know if he should be sooner than his 08/27/21 appt

## 2021-08-12 ENCOUNTER — Telehealth: Payer: Self-pay | Admitting: Internal Medicine

## 2021-08-12 NOTE — Telephone Encounter (Signed)
See phone note from 08/12/21.

## 2021-08-12 NOTE — Telephone Encounter (Signed)
[name] Starr, MD  [facility] Triage Pool 1 minute ago (5:12 PM)   Patient can be scheduled with an APP to be evaluated next week.   It can take time for the blood in the sputum to resolve after a pneumonia and now being on a blood thinner.   A repeat CT chest scan later in January will be beneficial to make sure is healing properly.   [name] and spoke with patient to let him know of Dr. August Albino recommendations. Patient stated that he was ok with his recs if he thinks that this is normal after having pneumonia and blood clots and now being on a blood thinner. Wanted me to send to Dr. Annamaria Boots to see if he agreed or had any additional recommendations or thinks that he needs to be seen sooner then 08/27/2021.  Dr. Annamaria Boots please advise

## 2021-08-12 NOTE — Telephone Encounter (Signed)
I called the patient and he is still having some sputum with a small amount of blood tinge in the dark yellow. He was in the hospital on 07/27/21 for Pneumonia and he is about the same. He reports shortness of breath with exertion since being in the hospital. He has been taking Mucinex and its helping some but he is concerned about how long he could see blood in his sputum since he is about 2 weeks out from the pneumonia. He wants to know if he need to go to UC to get evaluated of if we need to work him in sooner than 08/27/2021 appointment.

## 2021-08-12 NOTE — Telephone Encounter (Signed)
If he is not feeling worse, then I suggest he keep appointment for Jan 12, to allow more time for healing and recovery.  If he thinks something is worse and would like to be seen sooner, then ok to see an app.

## 2021-08-13 ENCOUNTER — Other Ambulatory Visit: Payer: Self-pay | Admitting: *Deleted

## 2021-08-13 NOTE — Patient Outreach (Signed)
[facility]) Care Management  08/13/2021  TAVEN STRITE 04-05-1935 [id number]   Outgoing call placed to member to complete initial assessment, state this is not a good time to talk.  Request made for member to call back when he is available.  If no call back, will follow up within the next 3-4 business days.  [name], RN, MSN, [place] Manager [phone]

## 2021-08-13 NOTE — Telephone Encounter (Signed)
Called and spoke with patient to let him what Dr. Annamaria Boots said. He mentioned that today is is starting to feel a little better and is not coughing up as much. Advised him that if anything got worse for him to call the office to be seen sooner. Nothing further needed a this time.

## 2021-08-14 ENCOUNTER — Emergency Department (HOSPITAL_COMMUNITY): Payer: Medicare Other

## 2021-08-14 ENCOUNTER — Inpatient Hospital Stay (HOSPITAL_COMMUNITY)
Admission: EM | Admit: 2021-08-14 | Discharge: 2021-08-21 | DRG: 193 | Disposition: A | Discharge status: Skilled Nursing Facility | Payer: Medicare Other | Attending: Family Medicine | Admitting: Family Medicine

## 2021-08-14 ENCOUNTER — Encounter (HOSPITAL_COMMUNITY): Payer: Self-pay | Admitting: Emergency Medicine

## 2021-08-14 DIAGNOSIS — E8809 Other disorders of plasma-protein metabolism, not elsewhere classified: Secondary | ICD-10-CM | POA: Diagnosis present

## 2021-08-14 DIAGNOSIS — E785 Hyperlipidemia, unspecified: Secondary | ICD-10-CM | POA: Diagnosis present

## 2021-08-14 DIAGNOSIS — Z938 Other artificial opening status: Secondary | ICD-10-CM

## 2021-08-14 DIAGNOSIS — Z87891 Personal history of nicotine dependence: Secondary | ICD-10-CM

## 2021-08-14 DIAGNOSIS — J9601 Acute respiratory failure with hypoxia: Secondary | ICD-10-CM | POA: Diagnosis not present

## 2021-08-14 DIAGNOSIS — J90 Pleural effusion, not elsewhere classified: Secondary | ICD-10-CM | POA: Diagnosis not present

## 2021-08-14 DIAGNOSIS — K5900 Constipation, unspecified: Secondary | ICD-10-CM | POA: Diagnosis present

## 2021-08-14 DIAGNOSIS — I110 Hypertensive heart disease with heart failure: Secondary | ICD-10-CM | POA: Diagnosis present

## 2021-08-14 DIAGNOSIS — I10 Essential (primary) hypertension: Secondary | ICD-10-CM | POA: Diagnosis not present

## 2021-08-14 DIAGNOSIS — R0603 Acute respiratory distress: Secondary | ICD-10-CM | POA: Diagnosis not present

## 2021-08-14 DIAGNOSIS — R351 Nocturia: Secondary | ICD-10-CM | POA: Diagnosis present

## 2021-08-14 DIAGNOSIS — F419 Anxiety disorder, unspecified: Secondary | ICD-10-CM | POA: Diagnosis present

## 2021-08-14 DIAGNOSIS — Z7951 Long term (current) use of inhaled steroids: Secondary | ICD-10-CM

## 2021-08-14 DIAGNOSIS — I484 Atypical atrial flutter: Secondary | ICD-10-CM | POA: Diagnosis present

## 2021-08-14 DIAGNOSIS — Z20822 Contact with and (suspected) exposure to covid-19: Secondary | ICD-10-CM | POA: Diagnosis present

## 2021-08-14 DIAGNOSIS — J9312 Secondary spontaneous pneumothorax: Secondary | ICD-10-CM | POA: Diagnosis not present

## 2021-08-14 DIAGNOSIS — Z7189 Other specified counseling: Secondary | ICD-10-CM | POA: Diagnosis not present

## 2021-08-14 DIAGNOSIS — I5033 Acute on chronic diastolic (congestive) heart failure: Secondary | ICD-10-CM | POA: Diagnosis not present

## 2021-08-14 DIAGNOSIS — Z9981 Dependence on supplemental oxygen: Secondary | ICD-10-CM | POA: Diagnosis not present

## 2021-08-14 DIAGNOSIS — N401 Enlarged prostate with lower urinary tract symptoms: Secondary | ICD-10-CM | POA: Diagnosis not present

## 2021-08-14 DIAGNOSIS — I4891 Unspecified atrial fibrillation: Secondary | ICD-10-CM | POA: Diagnosis not present

## 2021-08-14 DIAGNOSIS — J439 Emphysema, unspecified: Secondary | ICD-10-CM | POA: Diagnosis not present

## 2021-08-14 DIAGNOSIS — Z743 Need for continuous supervision: Secondary | ICD-10-CM | POA: Diagnosis not present

## 2021-08-14 DIAGNOSIS — J939 Pneumothorax, unspecified: Secondary | ICD-10-CM | POA: Diagnosis not present

## 2021-08-14 DIAGNOSIS — A419 Sepsis, unspecified organism: Secondary | ICD-10-CM | POA: Diagnosis not present

## 2021-08-14 DIAGNOSIS — I2699 Other pulmonary embolism without acute cor pulmonale: Secondary | ICD-10-CM

## 2021-08-14 DIAGNOSIS — Z880 Allergy status to penicillin: Secondary | ICD-10-CM

## 2021-08-14 DIAGNOSIS — G629 Polyneuropathy, unspecified: Secondary | ICD-10-CM | POA: Diagnosis present

## 2021-08-14 DIAGNOSIS — E7800 Pure hypercholesterolemia, unspecified: Secondary | ICD-10-CM | POA: Diagnosis not present

## 2021-08-14 DIAGNOSIS — Z66 Do not resuscitate: Secondary | ICD-10-CM | POA: Diagnosis present

## 2021-08-14 DIAGNOSIS — R0602 Shortness of breath: Secondary | ICD-10-CM | POA: Diagnosis not present

## 2021-08-14 DIAGNOSIS — I471 Supraventricular tachycardia: Secondary | ICD-10-CM | POA: Diagnosis present

## 2021-08-14 DIAGNOSIS — I480 Paroxysmal atrial fibrillation: Secondary | ICD-10-CM | POA: Diagnosis not present

## 2021-08-14 DIAGNOSIS — R1311 Dysphagia, oral phase: Secondary | ICD-10-CM | POA: Diagnosis present

## 2021-08-14 DIAGNOSIS — R0902 Hypoxemia: Secondary | ICD-10-CM | POA: Diagnosis not present

## 2021-08-14 DIAGNOSIS — E43 Unspecified severe protein-calorie malnutrition: Secondary | ICD-10-CM | POA: Diagnosis present

## 2021-08-14 DIAGNOSIS — D638 Anemia in other chronic diseases classified elsewhere: Secondary | ICD-10-CM | POA: Diagnosis present

## 2021-08-14 DIAGNOSIS — R54 Age-related physical debility: Secondary | ICD-10-CM | POA: Diagnosis present

## 2021-08-14 DIAGNOSIS — Y95 Nosocomial condition: Secondary | ICD-10-CM | POA: Diagnosis present

## 2021-08-14 DIAGNOSIS — R042 Hemoptysis: Secondary | ICD-10-CM | POA: Diagnosis present

## 2021-08-14 DIAGNOSIS — J9621 Acute and chronic respiratory failure with hypoxia: Secondary | ICD-10-CM | POA: Diagnosis present

## 2021-08-14 DIAGNOSIS — Z8249 Family history of ischemic heart disease and other diseases of the circulatory system: Secondary | ICD-10-CM

## 2021-08-14 DIAGNOSIS — Z886 Allergy status to analgesic agent status: Secondary | ICD-10-CM

## 2021-08-14 DIAGNOSIS — I5031 Acute diastolic (congestive) heart failure: Secondary | ICD-10-CM | POA: Diagnosis not present

## 2021-08-14 DIAGNOSIS — R627 Adult failure to thrive: Secondary | ICD-10-CM | POA: Diagnosis present

## 2021-08-14 DIAGNOSIS — Z681 Body mass index (BMI) 19 or less, adult: Secondary | ICD-10-CM

## 2021-08-14 DIAGNOSIS — I700 Atherosclerosis of aorta: Secondary | ICD-10-CM | POA: Diagnosis not present

## 2021-08-14 DIAGNOSIS — J189 Pneumonia, unspecified organism: Principal | ICD-10-CM | POA: Diagnosis present

## 2021-08-14 DIAGNOSIS — Z515 Encounter for palliative care: Secondary | ICD-10-CM

## 2021-08-14 DIAGNOSIS — Z7901 Long term (current) use of anticoagulants: Secondary | ICD-10-CM

## 2021-08-14 DIAGNOSIS — Z881 Allergy status to other antibiotic agents status: Secondary | ICD-10-CM

## 2021-08-14 DIAGNOSIS — J441 Chronic obstructive pulmonary disease with (acute) exacerbation: Secondary | ICD-10-CM | POA: Diagnosis not present

## 2021-08-14 DIAGNOSIS — Z8701 Personal history of pneumonia (recurrent): Secondary | ICD-10-CM

## 2021-08-14 DIAGNOSIS — Z823 Family history of stroke: Secondary | ICD-10-CM

## 2021-08-14 DIAGNOSIS — R531 Weakness: Secondary | ICD-10-CM | POA: Diagnosis not present

## 2021-08-14 DIAGNOSIS — R062 Wheezing: Secondary | ICD-10-CM | POA: Diagnosis not present

## 2021-08-14 DIAGNOSIS — Z79899 Other long term (current) drug therapy: Secondary | ICD-10-CM

## 2021-08-14 DIAGNOSIS — M1990 Unspecified osteoarthritis, unspecified site: Secondary | ICD-10-CM | POA: Diagnosis present

## 2021-08-14 DIAGNOSIS — Z888 Allergy status to other drugs, medicaments and biological substances status: Secondary | ICD-10-CM

## 2021-08-14 DIAGNOSIS — J449 Chronic obstructive pulmonary disease, unspecified: Secondary | ICD-10-CM

## 2021-08-14 DIAGNOSIS — I2510 Atherosclerotic heart disease of native coronary artery without angina pectoris: Secondary | ICD-10-CM | POA: Diagnosis present

## 2021-08-14 DIAGNOSIS — Z825 Family history of asthma and other chronic lower respiratory diseases: Secondary | ICD-10-CM

## 2021-08-14 DIAGNOSIS — Z4682 Encounter for fitting and adjustment of non-vascular catheter: Secondary | ICD-10-CM

## 2021-08-14 DIAGNOSIS — R000 Tachycardia, unspecified: Secondary | ICD-10-CM | POA: Diagnosis not present

## 2021-08-14 DIAGNOSIS — J942 Hemothorax: Secondary | ICD-10-CM

## 2021-08-14 LAB — URINALYSIS, ROUTINE W REFLEX MICROSCOPIC
Bilirubin Urine: NEGATIVE
Glucose, UA: NEGATIVE mg/dL
Ketones, ur: NEGATIVE mg/dL
Leukocytes,Ua: NEGATIVE
Nitrite: NEGATIVE
Protein, ur: 30 mg/dL — AB
RBC / HPF: >50 RBC/hpf — ABNORMAL HIGH (ref 0–5)
Specific Gravity, Urine: 1.023 (ref 1.005–1.030)
pH: 5 (ref 5.0–8.0)

## 2021-08-14 LAB — RESP PANEL BY RT-PCR (FLU A&B, COVID) ARPGX2
Influenza A by PCR: NEGATIVE
Influenza B by PCR: NEGATIVE
SARS Coronavirus 2 by RT PCR: NEGATIVE

## 2021-08-14 LAB — CBC WITH DIFFERENTIAL/PLATELET
Abs Immature Granulocytes: 0.17 10*3/uL — ABNORMAL HIGH (ref 0.00–0.07)
Basophils Absolute: 0 10*3/uL (ref 0.0–0.1)
Basophils Relative: 0 %
Eosinophils Absolute: 0.1 10*3/uL (ref 0.0–0.5)
Eosinophils Relative: 1 %
HCT: 36.8 % — ABNORMAL LOW (ref 39.0–52.0)
Hemoglobin: 11.6 g/dL — ABNORMAL LOW (ref 13.0–17.0)
Immature Granulocytes: 1 %
Lymphocytes Relative: 5 %
Lymphs Abs: 1 10*3/uL (ref 0.7–4.0)
MCH: 29.1 pg (ref 26.0–34.0)
MCHC: 31.5 g/dL (ref 30.0–36.0)
MCV: 92.5 fL (ref 80.0–100.0)
Monocytes Absolute: 0.6 10*3/uL (ref 0.1–1.0)
Monocytes Relative: 3 %
Neutro Abs: 17.4 10*3/uL — ABNORMAL HIGH (ref 1.7–7.7)
Neutrophils Relative %: 90 %
Platelets: 295 10*3/uL (ref 150–400)
RBC: 3.98 MIL/uL — ABNORMAL LOW (ref 4.22–5.81)
RDW: 13.9 % (ref 11.5–15.5)
WBC: 19.3 10*3/uL — ABNORMAL HIGH (ref 4.0–10.5)
nRBC: 0 % (ref 0.0–0.2)

## 2021-08-14 LAB — BASIC METABOLIC PANEL
Anion gap: 9 (ref 5–15)
BUN: 15 mg/dL (ref 8–23)
CO2: 28 mmol/L (ref 22–32)
Calcium: 8.4 mg/dL — ABNORMAL LOW (ref 8.9–10.3)
Chloride: 97 mmol/L — ABNORMAL LOW (ref 98–111)
Creatinine, Ser: 0.59 mg/dL — ABNORMAL LOW (ref 0.61–1.24)
GFR, Estimated: >60 mL/min (ref >60)
Glucose, Bld: 131 mg/dL — ABNORMAL HIGH (ref 70–99)
Potassium: 4.3 mmol/L (ref 3.5–5.1)
Sodium: 134 mmol/L — ABNORMAL LOW (ref 135–145)

## 2021-08-14 LAB — PROTIME-INR
INR: 1.2 (ref 0.8–1.2)
Prothrombin Time: 15.4 seconds — ABNORMAL HIGH (ref 11.4–15.2)

## 2021-08-14 LAB — MRSA NEXT GEN BY PCR, NASAL: MRSA by PCR Next Gen: NOT DETECTED

## 2021-08-14 LAB — LACTIC ACID, PLASMA: Lactic Acid, Venous: 1.5 mmol/L (ref 0.5–1.9)

## 2021-08-14 LAB — APTT: aPTT: 30 seconds (ref 24–36)

## 2021-08-14 LAB — BRAIN NATRIURETIC PEPTIDE: B Natriuretic Peptide: 229 pg/mL — ABNORMAL HIGH (ref 0.0–100.0)

## 2021-08-14 MED ORDER — POLYETHYLENE GLYCOL 3350 17 G PO PACK
17.0000 g | PACK | Freq: Every morning | ORAL | Status: DC
  Administered 2021-08-15 – 2021-08-19 (×5): 17 g via ORAL
  Filled 2021-08-14 (×5): qty 1

## 2021-08-14 MED ORDER — SODIUM CHLORIDE 0.9% FLUSH
3.0000 mL | Freq: Two times a day (BID) | INTRAVENOUS | Status: DC
  Administered 2021-08-14 – 2021-08-21 (×13): 3 mL via INTRAVENOUS

## 2021-08-14 MED ORDER — ACETAMINOPHEN 325 MG PO TABS
650.0000 mg | ORAL_TABLET | Freq: Four times a day (QID) | ORAL | Status: DC | PRN
  Administered 2021-08-16 – 2021-08-18 (×4): 650 mg via ORAL
  Filled 2021-08-14 (×4): qty 2

## 2021-08-14 MED ORDER — TAMSULOSIN HCL 0.4 MG PO CAPS
0.4000 mg | ORAL_CAPSULE | Freq: Every day | ORAL | Status: DC
  Administered 2021-08-14 – 2021-08-21 (×8): 0.4 mg via ORAL
  Filled 2021-08-14 (×9): qty 1

## 2021-08-14 MED ORDER — APIXABAN 5 MG PO TABS
5.0000 mg | ORAL_TABLET | Freq: Two times a day (BID) | ORAL | Status: DC
  Administered 2021-08-14 – 2021-08-21 (×16): 5 mg via ORAL
  Filled 2021-08-14 (×16): qty 1

## 2021-08-14 MED ORDER — LEVALBUTEROL HCL 1.25 MG/0.5ML IN NEBU
1.2500 mg | INHALATION_SOLUTION | Freq: Four times a day (QID) | RESPIRATORY_TRACT | Status: DC
Start: 2021-08-14 — End: 2021-08-18
  Administered 2021-08-14 – 2021-08-18 (×14): 1.25 mg via RESPIRATORY_TRACT
  Filled 2021-08-14 (×15): qty 0.5

## 2021-08-14 MED ORDER — ARFORMOTEROL TARTRATE 15 MCG/2ML IN NEBU
15.0000 ug | INHALATION_SOLUTION | Freq: Two times a day (BID) | RESPIRATORY_TRACT | Status: DC
  Administered 2021-08-14 – 2021-08-21 (×15): 15 ug via RESPIRATORY_TRACT
  Filled 2021-08-14 (×15): qty 2

## 2021-08-14 MED ORDER — ACETAMINOPHEN 650 MG RE SUPP: 650.0000 mg | Freq: Four times a day (QID) | RECTAL | Status: DC | PRN

## 2021-08-14 MED ORDER — UMECLIDINIUM BROMIDE 62.5 MCG/ACT IN AEPB
1.0000 | INHALATION_SPRAY | Freq: Every day | RESPIRATORY_TRACT | Status: DC
  Administered 2021-08-16 – 2021-08-21 (×4): 1 via RESPIRATORY_TRACT
  Filled 2021-08-14: qty 7

## 2021-08-14 MED ORDER — ADULT MULTIVITAMIN W/MINERALS CH
1.0000 | ORAL_TABLET | Freq: Every day | ORAL | Status: DC
  Administered 2021-08-14 – 2021-08-21 (×8): 1 via ORAL
  Filled 2021-08-14 (×8): qty 1

## 2021-08-14 MED ORDER — LACTATED RINGERS IV SOLN: INTRAVENOUS | Status: DC

## 2021-08-14 MED ORDER — FUROSEMIDE 10 MG/ML IJ SOLN
40.0000 mg | Freq: Two times a day (BID) | INTRAMUSCULAR | Status: DC
Start: 2021-08-14 — End: 2021-08-17
  Administered 2021-08-14 – 2021-08-17 (×6): 40 mg via INTRAVENOUS
  Filled 2021-08-14 (×6): qty 4

## 2021-08-14 MED ORDER — SODIUM CHLORIDE 0.9 % IV SOLN
2.0000 g | Freq: Three times a day (TID) | INTRAVENOUS | Status: DC
  Administered 2021-08-14 – 2021-08-18 (×11): 2 g via INTRAVENOUS
  Filled 2021-08-14 (×11): qty 2

## 2021-08-14 MED ORDER — FUROSEMIDE 10 MG/ML IJ SOLN
20.0000 mg | Freq: Once | INTRAMUSCULAR | Status: AC
  Administered 2021-08-14: 13:00:00 20 mg via INTRAVENOUS
  Filled 2021-08-14: qty 2

## 2021-08-14 MED ORDER — DILTIAZEM HCL 25 MG/5ML IV SOLN
10.0000 mg | Freq: Once | INTRAVENOUS | Status: AC
  Administered 2021-08-14: 10:00:00 10 mg via INTRAVENOUS

## 2021-08-14 MED ORDER — SENNOSIDES-DOCUSATE SODIUM 8.6-50 MG PO TABS
1.0000 | ORAL_TABLET | Freq: Every evening | ORAL | Status: DC | PRN
  Administered 2021-08-19: 1 via ORAL
  Filled 2021-08-14: qty 1

## 2021-08-14 MED ORDER — VANCOMYCIN HCL 750 MG/150ML IV SOLN
750.0000 mg | Freq: Two times a day (BID) | INTRAVENOUS | Status: DC
  Administered 2021-08-15 – 2021-08-18 (×7): 750 mg via INTRAVENOUS
  Filled 2021-08-14 (×8): qty 150

## 2021-08-14 MED ORDER — GUAIFENESIN ER 600 MG PO TB12
600.0000 mg | ORAL_TABLET | Freq: Two times a day (BID) | ORAL | Status: DC
  Administered 2021-08-14 (×2): 600 mg via ORAL
  Filled 2021-08-14 (×2): qty 1

## 2021-08-14 MED ORDER — SODIUM CHLORIDE 0.9 % IV SOLN
500.0000 mg | Freq: Once | INTRAVENOUS | Status: AC
  Administered 2021-08-14: 10:00:00 500 mg via INTRAVENOUS
  Filled 2021-08-14: qty 5

## 2021-08-14 MED ORDER — DILTIAZEM HCL 25 MG/5ML IV SOLN
10.0000 mg | Freq: Once | INTRAVENOUS | Status: AC
  Administered 2021-08-14: 09:00:00 10 mg via INTRAVENOUS
  Filled 2021-08-14: qty 5

## 2021-08-14 MED ORDER — ACETAMINOPHEN 500 MG PO TABS
1000.0000 mg | ORAL_TABLET | Freq: Once | ORAL | Status: AC
  Administered 2021-08-14: 10:00:00 1000 mg via ORAL
  Filled 2021-08-14: qty 2

## 2021-08-14 MED ORDER — PRAVASTATIN SODIUM 40 MG PO TABS
40.0000 mg | ORAL_TABLET | Freq: Every evening | ORAL | Status: DC
  Administered 2021-08-14 – 2021-08-21 (×8): 40 mg via ORAL
  Filled 2021-08-14 (×8): qty 1

## 2021-08-14 MED ORDER — MAGNESIUM SULFATE 2 GM/50ML IV SOLN
2.0000 g | Freq: Once | INTRAVENOUS | Status: AC
  Administered 2021-08-14: 09:00:00 2 g via INTRAVENOUS
  Filled 2021-08-14: qty 50

## 2021-08-14 MED ORDER — SODIUM CHLORIDE 0.9 % IV SOLN: 250.0000 mL | INTRAVENOUS | Status: DC | PRN

## 2021-08-14 MED ORDER — FUROSEMIDE 10 MG/ML IJ SOLN: 20.0000 mg | Freq: Two times a day (BID) | INTRAMUSCULAR | Status: DC

## 2021-08-14 MED ORDER — MELATONIN 5 MG PO TABS
5.0000 mg | ORAL_TABLET | Freq: Every evening | ORAL | Status: DC | PRN
  Filled 2021-08-14: qty 1

## 2021-08-14 MED ORDER — LEVALBUTEROL HCL 0.63 MG/3ML IN NEBU
0.6300 mg | INHALATION_SOLUTION | Freq: Four times a day (QID) | RESPIRATORY_TRACT | Status: DC | PRN
  Administered 2021-08-18 – 2021-08-19 (×2): 0.63 mg via RESPIRATORY_TRACT

## 2021-08-14 MED ORDER — VITAMIN B-12 1000 MCG PO TABS
1000.0000 ug | ORAL_TABLET | Freq: Every day | ORAL | Status: DC
  Administered 2021-08-14 – 2021-08-21 (×8): 1000 ug via ORAL
  Filled 2021-08-14 (×8): qty 1

## 2021-08-14 MED ORDER — VANCOMYCIN HCL 1500 MG/300ML IV SOLN
1500.0000 mg | Freq: Once | INTRAVENOUS | Status: AC
  Administered 2021-08-14: 14:00:00 1500 mg via INTRAVENOUS
  Filled 2021-08-14: qty 300

## 2021-08-14 MED ORDER — BUDESONIDE 0.25 MG/2ML IN SUSP
0.2500 mg | Freq: Two times a day (BID) | RESPIRATORY_TRACT | Status: DC
  Administered 2021-08-14 – 2021-08-21 (×15): 0.25 mg via RESPIRATORY_TRACT
  Filled 2021-08-14 (×15): qty 2

## 2021-08-14 MED ORDER — THIAMINE HCL 100 MG PO TABS
100.0000 mg | ORAL_TABLET | Freq: Every day | ORAL | Status: DC
  Administered 2021-08-14 – 2021-08-21 (×8): 100 mg via ORAL
  Filled 2021-08-14 (×8): qty 1

## 2021-08-14 MED ORDER — DILTIAZEM HCL-DEXTROSE 125-5 MG/125ML-% IV SOLN (PREMIX)
5.0000 mg/h | INTRAVENOUS | Status: DC
Start: 2021-08-14 — End: 2021-08-16
  Administered 2021-08-14: 11:00:00 5 mg/h via INTRAVENOUS
  Filled 2021-08-14 (×2): qty 125

## 2021-08-14 MED ORDER — SODIUM CHLORIDE 0.9 % IV SOLN
2.0000 g | Freq: Once | INTRAVENOUS | Status: AC
  Administered 2021-08-14: 10:00:00 2 g via INTRAVENOUS
  Filled 2021-08-14: qty 2

## 2021-08-14 MED ORDER — HYDRALAZINE HCL 20 MG/ML IJ SOLN: 10.0000 mg | Freq: Three times a day (TID) | INTRAMUSCULAR | Status: DC | PRN

## 2021-08-14 MED ORDER — IPRATROPIUM BROMIDE 0.02 % IN SOLN
0.5000 mg | Freq: Four times a day (QID) | RESPIRATORY_TRACT | Status: DC
Start: 2021-08-14 — End: 2021-08-18
  Administered 2021-08-14 – 2021-08-18 (×15): 0.5 mg via RESPIRATORY_TRACT
  Filled 2021-08-14 (×15): qty 2.5

## 2021-08-14 MED ORDER — SODIUM CHLORIDE 0.9% FLUSH: 3.0000 mL | INTRAVENOUS | Status: DC | PRN

## 2021-08-14 NOTE — Sepsis Progress Note (Signed)
eLink is monitoring this Code Sepsis. °

## 2021-08-14 NOTE — ED Notes (Signed)
Informed RN of heart monitor alerts. ?

## 2021-08-14 NOTE — ED Provider Notes (Signed)
[facility] EMERGENCY DEPARTMENT Provider Note   CSN: [id number] Arrival date & time: 08/14/21  6160     History Chief Complaint  Patient presents with   Shortness of Breath    [name] is a 86 y.o. male.  The history is provided by the patient, the EMS personnel and medical records. No language interpreter was used.   86 year old male significant history of COPD on chronic oxygen at 3 L, hypertension, neuropathy brought here via EMS from home for evaluation of shortness of breath.  Patient previously was admitted to the hospital from 12/12 and discharged on 07/30/21 for managements of worsening shortness of breath.  He was diagnosed with a small PE and was started on Eliquis.  He was also treated for pneumonia.  Patient subsequently discharged home with azithromycin and Omnicef.  Patient states he was improving however since yesterday he endorsed progressive worsening shortness of breath, wheezing, and just having a difficulty breathing over.  He still endorsed persistent cough.  He did have noticed swelling to his lower extremities bilaterally.  He denies any significant fever, exertional chest pain, abdominal pain or back pain.  When EMS arrived, patient was having difficulty breathing, with tight lung sounds.  Patient received 2 duo nebs and Solu-Medrol prior to arrival.  He was noted to be in atrial fibrillation with RVR prior to the administration of the medication.  He was given 500 mL of normal saline on arrival.  Patient without history of A. fib in the past.  Past Medical History:  Diagnosis Date   Allergic rhinitis, cause unspecified    Arthritis    COPD (chronic obstructive pulmonary disease) (HCC)    Dyspnea    Dysrhythmia    "1 episode of tachycardia in 1980's, been on it ever since"   Emphysema of lung ([place])    Hypertension    Left inguinal hernia 02/07/2019   Low hemoglobin    low level   Neuromuscular disorder (HCC)    Neuropathy    feet    Pneumonia    History of, last Oct 2019   Pulmonary nodule     Patient Active Problem List   Diagnosis Date Noted   Acute on chronic respiratory failure with hypoxia ([place]) 07/27/2021   Community acquired pneumonia    Acute pulmonary embolism (HCC)    Benign prostatic hyperplasia with nocturia 09/20/2019   Closed fracture of first lumbar vertebra ([place]) 09/20/2019   Idiopathic progressive neuropathy 09/20/2019   Other fatigue 09/20/2019   Venous insufficiency of both lower extremities 09/20/2019   Anemia 07/25/2019   Left inguinal hernia 02/07/2019   Left leg pain 05/18/2018   Pneumonia 05/18/2018   Chronic respiratory failure with hypoxia ([place]) 01/12/2015   COPD with acute exacerbation ([place]) 01/21/2013   Hyperlipidemia 12/01/2011   Hypertension 12/01/2011   CAD (coronary artery disease) 05/23/2011   Lung nodule 09/16/2008   Allergic rhinitis 07/24/2007   COPD mixed type ([place]) 07/24/2007   BURSITIS 07/24/2007    Past Surgical History:  Procedure Laterality Date   [place]  2003   performed at [facility], Dr. Fransico Him   CATARACT EXTRACTION  2008   Dr. Darleen Crocker   CATARACT EXTRACTION, BILATERAL     EYE SURGERY     INGUINAL HERNIA REPAIR Left 02/07/2019   Procedure: OPEN REPAIR LEFT INGUINAL HERNIA WITH MESH;  Surgeon: Fanny Skates, MD;  Location: [place];  Service: General;  Laterality: Left;  SPINAL AND  TAP BLOCK ANESTHESIA   ROTATOR CUFF REPAIR  2002   Dr. Kathryne Hitch   TONSILLECTOMY  1945   VASECTOMY         Family History  Problem Relation Age of Onset   COPD Mother    Stroke Mother    Stroke Father    Coronary artery disease Other        [facility]    Social History   Tobacco Use   Smoking status: Former    Packs/day: 2.00    Years: 38.00    Pack years: 76.00    Types: Cigarettes    Quit date: 08/16/1990    Years since quitting: 31.0   Smokeless tobacco: Never  Vaping Use   Vaping Use: Never used  Substance Use  Topics   Alcohol use: Yes    Alcohol/week: 7.0 standard drinks    Types: 7 Shots of liquor per week    Comment: reports having a cocktail nightly with wife   Drug use: No    Home Medications Prior to Admission medications   Medication Sig Start Date End Date Taking? Authorizing Provider  acetaminophen (TYLENOL) 650 MG CR tablet Take 1,300 mg by mouth See admin instructions. Take 2 tablets (1300 mg) by mouth every morning, may also take 1-2 tablets ([phone] mg) at 4p-5p if needed for pain    [provider]  apixaban (ELIQUIS) 5 MG TABS tablet Take 1-2 tablets (5-10 mg total) by mouth 2 (two) times daily. Take 2 tablets (10mg ) by mouth 2 (two) time daily for 6 days with first dose 12/15 PM then on 12/21 PM reduce dose to 1 tablet (5mg ) by mouth 2 (two) time daily 07/30/21   Donne Hazel, MD  azithromycin (ZITHROMAX) 250 MG tablet Take 1 tablet by mouth daily. 07/30/21   Donne Hazel, MD  BREZTRI AEROSPHERE 160-9-4.8 MCG/ACT AERO INHALE 2 PUFFS INTO THE LUNGS IN THE MORNING AND AT BEDTIME Patient taking differently: Inhale 2 puffs into the lungs in the morning and at bedtime. 09/12/20   Baird Lyons D, MD  DULoxetine (CYMBALTA) 20 MG capsule Take 1 capsule (20 mg total) by mouth daily. Patient not taking: Reported on 07/27/2021 09/25/20   Mariea Clonts, Tiffany L, DO  ipratropium-albuterol (DUONEB) 0.5-2.5 (3) MG/3ML SOLN Take 3 mLs by nebulization every 6 (six) hours as needed (for SOB AND wheezing every 6 hours AND PRN). Patient taking differently: Take 3 mLs by nebulization every 6 (six) hours as needed (wheezing/shortness of breath). 02/23/21   Deneise Lever, MD  ketoconazole (NIZORAL) 2 % shampoo Apply 1 application topically 3 (three) times a week. 05/01/20   [provider]  levalbuterol Penne Lash HFA) 45 MCG/ACT inhaler Inhale 2 puffs every 6 hours as needed- rescue Patient taking differently: Inhale 2 puffs into the lungs every 6 (six) hours as needed for wheezing or  shortness of breath. 07/21/21   Deneise Lever, MD  losartan (COZAAR) 25 MG tablet Take 1 tablet (25 mg total) by mouth daily. Please keep upcoming appt in April 2022 with Dr. Burt Knack before anymore refills. Thank you Patient taking differently: Take 25 mg by mouth every morning. 08/25/20   Reed, Tiffany L, DO  Nutritional Supplements (ENSURE HIGH PROTEIN) LIQD Take 5.5-11 oz by mouth daily.    [provider]  OXYGEN Inhale 3-6 L into the lungs continuous. 3 at rest, 4-5 with exertion, 6 during shower    [provider]  Polyethyl Glycol-Propyl Glycol (SYSTANE) 0.4-0.3 % SOLN Place 1  drop into both eyes 2 (two) times daily as needed (dry eyes).    [provider]  polyethylene glycol powder (GLYCOLAX/MIRALAX) powder Take 17 g by mouth every morning.    [provider]  pravastatin (PRAVACHOL) 40 MG tablet TAKE 1 TABLET(40 MG) BY MOUTH DAILY Patient taking differently: Take 40 mg by mouth every evening. 10/23/20   Reed, Tiffany L, DO  protein supplement shake (PREMIER PROTEIN) LIQD Take 2 oz by mouth daily. Patient not taking: Reported on 07/27/2021    [provider]  tamsulosin (FLOMAX) 0.4 MG CAPS capsule Take two capsules by mouth once daily. Patient taking differently: 0.4 mg every evening. 01/14/21   Wardell Honour, MD  vitamin B-12 (CYANOCOBALAMIN) 1000 MCG tablet Take 1 tablet (1,000 mcg total) by mouth daily. Patient taking differently: Take 1,000 mcg by mouth daily with lunch. 10/19/19   Reed, Tiffany L, DO    Allergies    Tolectin [tolmetin sodium], Montelukast sodium, Nsaids, Augmentin [amoxicillin-pot clavulanate], and Neosporin [bacitracin-polymyxin b]  Review of Systems   Review of Systems  All other systems reviewed and are negative.  Physical Exam Updated Vital Signs BP (!) 93/58    Pulse (!) 115    Temp 98.8 F (37.1 C) (Rectal)    Resp (!) 24    Ht 5\' 9"  (1.753 m)    Wt 67 kg    SpO2 96%    BMI 21.81 kg/m   Physical  Exam Vitals and nursing note reviewed.  Constitutional:      General: He is not in acute distress.    Appearance: He is well-developed.     Comments: Patient in mild respiratory discomfort  HENT:     Head: Atraumatic.  Eyes:     Conjunctiva/sclera: Conjunctivae normal.  Cardiovascular:     Rate and Rhythm: Tachycardia present.     Heart sounds: No murmur heard.   No friction rub. No gallop.  Pulmonary:     Breath sounds: Wheezing present. No rhonchi or rales.     Comments: Decreased breath sounds with expiratory wheezes Abdominal:     Palpations: Abdomen is soft.  Musculoskeletal:     Cervical back: Neck supple.     Right lower leg: Edema present.     Left lower leg: Edema present.     Comments: 2+ pitting edema to bilateral lower extremities with intact pedal pulses  Skin:    Findings: No rash.  Neurological:     Mental Status: He is alert. Mental status is at baseline.  Psychiatric:        Mood and Affect: Mood normal.    ED Results / Procedures / Treatments   Labs (all labs ordered are listed, but only abnormal results are displayed) Labs Reviewed  BASIC METABOLIC PANEL - Abnormal; Notable for the following components:      Result Value   Sodium 134 (*)    Chloride 97 (*)    Glucose, Bld 131 (*)    Creatinine, Ser 0.59 (*)    Calcium 8.4 (*)    All other components within normal limits  CBC WITH DIFFERENTIAL/PLATELET - Abnormal; Notable for the following components:   WBC 19.3 (*)    RBC 3.98 (*)    Hemoglobin 11.6 (*)    HCT 36.8 (*)    Neutro Abs 17.4 (*)    Abs Immature Granulocytes 0.17 (*)    All other components within normal limits  BRAIN NATRIURETIC PEPTIDE - Abnormal; Notable for the following components:  B Natriuretic Peptide 229.0 (*)    All other components within normal limits  RESP PANEL BY RT-PCR (FLU A&B, COVID) ARPGX2  CULTURE, BLOOD (ROUTINE X 2)  CULTURE, BLOOD (ROUTINE X 2)  URINE CULTURE  LACTIC ACID, PLASMA  LACTIC ACID, PLASMA   PROTIME-INR  APTT  URINALYSIS, ROUTINE W REFLEX MICROSCOPIC    EKG None ED ECG REPORT   Date: 08/14/2021  Rate: 166  Rhythm: atrial fibrillation and RVR  QRS Axis: right  Intervals: normal  ST/T Wave abnormalities: nonspecific ST changes  Conduction Disutrbances:none  Narrative Interpretation:   Old EKG Reviewed: changes noted  I have personally reviewed the EKG tracing and agree with the computerized printout as noted.   Radiology DG Chest Port 1 View  Result Date: 08/14/2021 CLINICAL DATA:  Shortness of breath since yesterday EXAM: PORTABLE CHEST 1 VIEW COMPARISON:  Chest radiograph 07/27/2021, CTA chest 07/27/2021 FINDINGS: Cardiomediastinal silhouette is grossly stable. Confluent opacities in the right upper lobe and right perihilar region have worsened. Patchy opacities with internal lucencies in the right lower lobe have also worsened. There is a small right pleural effusion, increased in size. There is no definite right pneumothorax. The left lung is clear. There is no left pleural effusion or pneumothorax. There is no acute osseous abnormality. IMPRESSION: Overall worsened aeration of the right lung with increased opacities in the right upper and lower lung and perihilar region, and increased size of a right pleural effusion. Given findings on the CT chest from 07/27/2021, findings are concerning for worsened pneumonia with possible areas of cavitation. Electronically Signed   By: Valetta Mole M.D.   On: 08/14/2021 09:19    Procedures .Critical Care Performed by: Domenic Moras, PA-C Authorized by: Domenic Moras, PA-C   Critical care provider statement:    Critical care time (minutes):  46   Critical care was time spent personally by me on the following activities:  Development of treatment plan with patient or surrogate, discussions with consultants, evaluation of patient's response to treatment, examination of patient, ordering and review of laboratory studies, ordering and  review of radiographic studies, ordering and performing treatments and interventions, pulse oximetry, re-evaluation of patient's condition and review of old charts   Medications Ordered in ED Medications  lactated ringers infusion (has no administration in time range)  diltiazem (CARDIZEM) 125 mg in dextrose 5% 125 mL (1 mg/mL) infusion (has no administration in time range)  magnesium sulfate IVPB 2 g 50 mL (0 g Intravenous Stopped 08/14/21 1006)  diltiazem (CARDIZEM) injection 10 mg (10 mg Intravenous Given 08/14/21 0917)  diltiazem (CARDIZEM) injection 10 mg (10 mg Intravenous Given 08/14/21 0942)  acetaminophen (TYLENOL) tablet 1,000 mg (1,000 mg Oral Given 08/14/21 0945)  ceFEPIme (MAXIPIME) 2 g in sodium chloride 0.9 % 100 mL IVPB (0 g Intravenous Stopped 08/14/21 1043)  azithromycin (ZITHROMAX) 500 mg in sodium chloride 0.9 % 250 mL IVPB (500 mg Intravenous New Bag/Given 08/14/21 9675)    ED Course  I have reviewed the triage vital signs and the nursing notes.  Pertinent labs & imaging results that were available during my care of the patient were reviewed by me and considered in my medical decision making (see chart for details).    MDM Rules/Calculators/A&P                         BP (!) 93/58    Pulse (!) 115    Temp 98.8 F (37.1 C) (Rectal)  Resp (!) 24    Ht 5\' 9"  (1.753 m)    Wt 67 kg    SpO2 96%    BMI 21.81 kg/m      Final Clinical Impression(s) / ED Diagnoses Final diagnoses:  Acute hypoxemic respiratory failure (HCC)  HCAP (healthcare-associated pneumonia)  Sepsis, due to unspecified organism, unspecified whether acute organ dysfunction present ([place])    Rx / DC Orders ED Discharge Orders     None      9:03 AM Patient with history of chronic respiratory failure secondary to COPD who is normally on 3 L of oxygen.  He was brought here due to progressive worsening shortness of breath and EMS did place him on 5 L oxygen and we did gave him 2 duo nebs and  Solu-Medrol prior to arrival.  He did report some improvement of his symptoms. Discussion with EMS personal on arrival. On exam, lungs tight with faint wheezes heard.  Symptoms suggestive of COPD exacerbation.  He was noted to be in A. fib with RVR.  No known history of A. fib.  He is currently on Eliquis for recently diagnosed small PE on CT scan approximately 2 weeks ago when he was also diagnosed with pneumonia and was admitted to the hospital for IV antibiotic and discharged home with p.o. antibiotic.  Plan to provide additional breathing treatment including magnesium to aid with his COPD exacerbation.  Will obtain chest x-ray, basic labs, EKG and will monitor closely.  Will give 10mg  of cardizem for rate control.  Care discussed with Dr. Tomi Bamberger.   9:47 AM I have reviewed and interpreted chest x-ray.  It appears patient has worsening lung infection with evidence of new cavitation involving his right lung.  Chest x-ray appears much worse than prior. patient also has elevated white count of 19.3.  Normal rectal temp.  I have ordered for IV antibiotic including IV cefepime and IV azithromycin.  Patient is still not rate controlled after the first 10 mg of Cardizem, will repeat another 10 mg of Cardizem.  He is not hypotensive.  10:53 AM Blood pressure did decrease now to 93/58.  We will give IV fluid.  Heart rate is still elevated, will place patient on Cardizem drip.  Appreciate consultation from [facility] hospitalist, Dr. Sloan Leiter who agrees to admit patient to progressive bed.  He does recommend for patient to receive vancomycin along with cefepime, agrees with Cardizem drip, and I have also initiated code sepsis.  Patient is in agreement with plan.  Screening COVID test ordered.  Sepsis reassessment done.  Patient with new onset A. fib, CHA2DS2-VASc score not indicated as patient is currently on Eliquis.   Domenic Moras, PA-C 08/14/21 1219    Dorie Rank, MD 08/15/21 [phone]

## 2021-08-14 NOTE — Progress Notes (Signed)
Pharmacy Antibiotic Note [name] is a 86 y.o. male admitted on 08/14/2021 with pneumonia. Pharmacy has been consulted for Vancomycin dosing.  Patient has received cefepime and azithromycin per MD. Patient presenting with leukocytosis. Scr 0.59 - near baseline.  Plan: Vancomycin 1500 mg IV x 1, then 750 mg q12hr (eAUC: 861)  Follow up WBC, renal function, clinical course. Deescalate when able. Levels at steady state. Add MRSA PCR.   Height: 5\' 9"  (175.3 cm) Weight: 67 kg (147 lb 11.3 oz) IBW/kg (Calculated) : 70.7  Temp (24hrs), Avg:98.8 F (37.1 C), Min:98.8 F (37.1 C), Max:98.8 F (37.1 C)  Recent Labs  Lab 08/14/21 0915  WBC 19.3*  CREATININE 0.59*    Estimated Creatinine Clearance: 65.1 mL/min (A) (by C-G formula based on SCr of 0.59 mg/dL (L)).    Allergies  Allergen Reactions   Tolectin [Tolmetin Sodium] Other (See Comments)    BP low and passed out   Montelukast Sodium Other (See Comments)    Unknown reaction   Nsaids Other (See Comments)    MD told pt not to use   Augmentin [Amoxicillin-Pot Clavulanate] Nausea And Vomiting    Did it involve swelling of the face/tongue/throat, SOB, or low BP? No Did it involve sudden or severe rash/hives, skin peeling, or any reaction on the inside of your mouth or nose? No Did you need to seek medical attention at a hospital or doctor's office? No When did it last happen? Last year   If all above answers are "NO", may proceed with cephalosporin use.   Neosporin [Bacitracin-Polymyxin B] Rash   Antimicrobials this admission: Vancomycin 12/30 >>  Microbiology results: 12/30 BCx: to be collected 12/30 UCx: to be collected  12/30 MRSA PCR: ordered  Thank you for allowing pharmacy to be a part of this patients care.  Debbora Presto PharmD Candidate 08/14/2021 11:09 AM

## 2021-08-14 NOTE — ED Notes (Signed)
Unable to reach RN for report

## 2021-08-14 NOTE — Consult Note (Addendum)
Cardiology Consultation:   Patient ID: [name] MRN: [id number]; DOB: [birth year]  Admit date: 08/14/2021 Date of Consult: 08/14/2021  PCP:  Gayland Curry, [place] [facility] Cardiologist:  Sherren Mocha, MD       Patient Profile:   [name] is a 86 y.o. male with a hx of CAD who is being seen 08/14/2021 for the evaluation of new atrial fibrillation with RVR at the request of Dr. Sloan Leiter.  History of Present Illness:   [name] is a 86 year old male with past medical history of CAD, COPD, HTN, HLD and a history of BPH.  He had a remote cardiac catheterization in 2003 that demonstrated nonobstructive CAD with exception of a 80% small ostial ramus lesion that was managed medically.  Myoview obtained on 09/28/2019 showed EF 59%, overall low risk study, no ischemia.  Patient was last seen by Dr. Burt Knack on 11/14/2020, at which time he described worsening dyspnea which was felt to be related to his advanced lung disease.  Patient was recently admitted 2 weeks ago from 12/12-12/15 with acute on chronic respiratory failure.  Prior to that admission, he was using 3 L nasal cannula at rest and 4 to 6 L on physical exertion.  He described 1 month history of worsening dyspnea along with coughing and wheezing.  He was treated for acute COPD exacerbation.  CT of the lung was also concerning for large airway opacity involving the right side and small peripheral pulmonary embolus in the upper lobe of the left pulmonary artery.  He was given both antibiotic and started on Eliquis as well.  Echocardiogram obtained on 07/28/2021 showed EF 60 to 65%, no regional wall motion abnormality, mildly elevated PA systolic pressure, mild to moderate TR.  Since discharge, his overall condition has been slowly improving.  Yesterday around 8 PM, he started having shortness of breath after laying down to sleep at night.  He woke up this morning and has persistent shortness of breath.  He used a  pulse ox to check his heart rate and then noted his heart rate was in the 140s beats per minute.  This prompted the patient to come to the ED for further evaluation.  On arrival, his BNP was 229.  Chest x-ray showed worsening aeration of the right lung with increased opacity in the right upper and lower lung and also increased size of right pleural effusion.  Imaging was concerning for pneumonia.  Lactic acid 1.5.  White blood cell count elevated.  Blood culture currently pending.  EKG shows he was seen atrial fibrillation with RVR.  He has since been started on IV Cardizem for rate control.  Heart rate has came down to 70 to 80s P beats per minute after starting on IV Cardizem.  Cardiology service has been consulted for new atrial fibrillatiodn in the setting of COPD exacerbation and pneumonia.   Past Medical History:  Diagnosis Date   Allergic rhinitis, cause unspecified    Arthritis    COPD (chronic obstructive pulmonary disease) (HCC)    Dyspnea    Dysrhythmia    "1 episode of tachycardia in 1980's, been on it ever since"   Emphysema of lung ([place])    Hypertension    Left inguinal hernia 02/07/2019   Low hemoglobin    low level   Neuromuscular disorder (HCC)    Neuropathy    feet   Pneumonia    History of, last Oct 2019   Pulmonary nodule  Past Surgical History:  Procedure Laterality Date   APPENDECTOMY  1943   CARDIAC CATHETERIZATION  2003   performed at [facility], Dr. Fransico Him   CATARACT EXTRACTION  2008   Dr. Darleen Crocker   CATARACT EXTRACTION, BILATERAL     EYE SURGERY     INGUINAL HERNIA REPAIR Left 02/07/2019   Procedure: OPEN REPAIR LEFT INGUINAL HERNIA WITH MESH;  Surgeon: Fanny Skates, MD;  Location: [facility];  Service: General;  Laterality: Left;  SPINAL AND TAP BLOCK ANESTHESIA   ROTATOR CUFF REPAIR  2002   Dr. Kathryne Hitch   TONSILLECTOMY  1945   VASECTOMY       Home Medications:  Prior to Admission medications   Medication Sig Start Date End Date Taking?  Authorizing Provider  acetaminophen (TYLENOL) 650 MG CR tablet Take 1,300 mg by mouth See admin instructions. Take 2 tablets (1300 mg) by mouth every morning, may also take 1-2 tablets ([phone] mg) at 4p-5p if needed for pain   Yes [provider]  apixaban (ELIQUIS) 5 MG TABS tablet Take 1-2 tablets (5-10 mg total) by mouth 2 (two) times daily. Take 2 tablets (10mg ) by mouth 2 (two) time daily for 6 days with first dose 12/15 PM then on 12/21 PM reduce dose to 1 tablet (5mg ) by mouth 2 (two) time daily Patient taking differently: Take 5 mg by mouth 2 (two) times daily. Take 2 tablets (10mg ) by mouth 2 (two) time daily for 6 days with first dose 12/15 PM then on 12/21 PM reduce dose to 1 tablet (5mg ) by mouth 2 (two) time daily 07/30/21  Yes Donne Hazel, MD  BREZTRI AEROSPHERE 160-9-4.8 MCG/ACT AERO INHALE 2 PUFFS INTO THE LUNGS IN THE MORNING AND AT BEDTIME Patient taking differently: Inhale 2 puffs into the lungs in the morning and at bedtime. 09/12/20  Yes Young, Tarri Fuller D, MD  fluticasone (FLONASE) 50 MCG/ACT nasal spray Place 1 spray into both nostrils daily as needed for allergies or rhinitis.   Yes [provider]  guaiFENesin (MUCINEX) 600 MG 12 hr tablet Take 600 mg by mouth 2 (two) times daily as needed for cough or to loosen phlegm.   Yes [provider]  ipratropium-albuterol (DUONEB) 0.5-2.5 (3) MG/3ML SOLN Take 3 mLs by nebulization every 6 (six) hours as needed (for SOB AND wheezing every 6 hours AND PRN). Patient taking differently: Take 3 mLs by nebulization every 6 (six) hours as needed (wheezing/shortness of breath). 02/23/21  Yes Young, Tarri Fuller D, MD  ketoconazole (NIZORAL) 2 % shampoo Apply 1 application topically 3 (three) times a week. 05/01/20  Yes [provider]  levalbuterol (XOPENEX HFA) 45 MCG/ACT inhaler Inhale 2 puffs every 6 hours as needed- rescue Patient taking differently: Inhale 2 puffs into the lungs every 6 (six) hours as needed  for wheezing or shortness of breath. 07/21/21  Yes Young, Tarri Fuller D, MD  losartan (COZAAR) 25 MG tablet Take 1 tablet (25 mg total) by mouth daily. Please keep upcoming appt in April 2022 with Dr. Burt Knack before anymore refills. Thank you Patient taking differently: Take 25 mg by mouth every morning. 08/25/20  Yes Reed, Tiffany L, DO  melatonin 5 MG TABS Take 5 mg by mouth at bedtime as needed (sleep).   Yes [provider]  Nutritional Supplements (ENSURE HIGH PROTEIN) LIQD Take 237 oz by mouth daily.   Yes [provider]  OXYGEN Inhale 3-6 L into the lungs continuous. 3 at rest, 4-5 with exertion, 6 during  shower   Yes [provider]  Polyethyl Glycol-Propyl Glycol (SYSTANE) 0.4-0.3 % SOLN Place 1 drop into both eyes 2 (two) times daily as needed (dry eyes).   Yes [provider]  polyethylene glycol powder (GLYCOLAX/MIRALAX) powder Take 17 g by mouth every morning.   Yes [provider]  pravastatin (PRAVACHOL) 40 MG tablet TAKE 1 TABLET(40 MG) BY MOUTH DAILY Patient taking differently: Take 40 mg by mouth every evening. 10/23/20  Yes Reed, Tiffany L, DO  tamsulosin (FLOMAX) 0.4 MG CAPS capsule Take two capsules by mouth once daily. Patient taking differently: 0.4 mg every evening. 01/14/21  Yes Wardell Honour, MD  vitamin B-12 (CYANOCOBALAMIN) 1000 MCG tablet Take 1 tablet (1,000 mcg total) by mouth daily. Patient taking differently: Take 1,000 mcg by mouth daily with lunch. 10/19/19  Yes Reed, Tiffany L, DO    Inpatient Medications: Scheduled Meds:  apixaban  5 mg Oral BID   arformoterol  15 mcg Nebulization BID   budesonide (PULMICORT) nebulizer solution  0.25 mg Nebulization BID   guaiFENesin  600 mg Oral BID   ipratropium  0.5 mg Nebulization Q6H   levalbuterol  1.25 mg Nebulization Q6H   multivitamin with minerals  1 tablet Oral Daily   polyethylene glycol  17 g Oral q morning   pravastatin  40 mg Oral QPM   sodium chloride flush  3 mL  Intravenous Q12H   tamsulosin  0.4 mg Oral Daily   thiamine  100 mg Oral Daily   umeclidinium bromide  1 puff Inhalation Daily   vitamin B-12  1,000 mcg Oral Daily   Continuous Infusions:  sodium chloride     ceFEPime (MAXIPIME) IV     diltiazem (CARDIZEM) infusion 5 mg/hr (08/14/21 1125)   [START ON 08/15/2021] vancomycin     PRN Meds: sodium chloride, acetaminophen **OR** acetaminophen, hydrALAZINE, levalbuterol, melatonin, senna-docusate, sodium chloride flush  Allergies:    Allergies  Allergen Reactions   Tolectin [Tolmetin Sodium] Other (See Comments)    BP low and passed out   Montelukast Sodium Other (See Comments)    Unknown reaction   Nsaids Other (See Comments)    MD told pt not to use   Augmentin [Amoxicillin-Pot Clavulanate] Nausea And Vomiting    Did it involve swelling of the face/tongue/throat, SOB, or low BP? No Did it involve sudden or severe rash/hives, skin peeling, or any reaction on the inside of your mouth or nose? No Did you need to seek medical attention at a hospital or doctor's office? No When did it last happen? Last year   If all above answers are "NO", may proceed with cephalosporin use.   Neosporin [Bacitracin-Polymyxin B] Rash    Social History:   Social History   Socioeconomic History   Marital status: Married    Spouse name: Izora Gala   Number of children: 2   Years of education: Not on file   Highest education level: Bachelor's degree (e.g., BA, AB, BS)  Occupational History   Not on file  Tobacco Use   Smoking status: Former    Packs/day: 2.00    Years: 38.00    Pack years: 76.00    Types: Cigarettes    Quit date: 08/16/1990    Years since quitting: 31.0   Smokeless tobacco: Never  Vaping Use   Vaping Use: Never used  Substance and Sexual Activity   Alcohol use: Yes    Alcohol/week: 7.0 standard drinks    Types: 7 Shots of liquor per  week    Comment: reports having a cocktail nightly with wife   Drug use: No   Sexual activity:  Not Currently  Other Topics Concern   Not on file  Social History Narrative   Social History      Diet?       Do you drink/eat things with caffeine? Coffee 1 cup daily       Marital status?                      Married               What year were you married? 1962      Do you live in a house, apartment, assisted living, condo, trailer, etc.? house      Is it one or more stories? 2      How many persons live in your home?  2      Do you have any pets in your home? (please list)   No       Highest level of education completed?  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       Do you exercise?                    some                  Type & how often? Did pulmonary rehab      Advanced Directives      Do you have a living will? yes      Do you have a DNR form?                                  If not, do you want to discuss one?      Do you have signed POA/HPOA for forms? yes      Functional Status      Do you have difficulty bathing or dressing yourself?      Do you have difficulty preparing food or eating?       Do you have difficulty managing your medications?      Do you have difficulty managing your finances?      Do you have difficulty affording your medications?      Social Determinants of Health   Financial Resource Strain: Not on file  Food Insecurity: No Food Insecurity   Worried About Charity fundraiser in the Last Year: Never true   Ran Out of Food in the Last Year: Never true  Transportation Needs: No Transportation Needs   Lack of Transportation (Medical): No   Lack of Transportation (Non-Medical): No  Physical Activity: Not on file  Stress: Not on file  Social Connections: Not on file  Intimate Partner Violence: Not on file    Family History:    Family History  Problem Relation Age of Onset   COPD Mother    Stroke Mother    Stroke Father    Coronary artery disease Other        [place]      ROS:  Please see the history of present illness.   All other ROS reviewed and negative.     Physical Exam/Data:   Vitals:   08/14/21 1430 08/14/21 1500 08/14/21 1515 08/14/21 1526  BP: (!) 105/56 (!) 115/54 (!) 101/58  Pulse: 95 94 93   Resp: 16  17   Temp:    98.8 F (37.1 C)  TempSrc:    Oral  SpO2: 96% 95% 96%   Weight:      Height:       No intake or output data in the 24 hours ending 08/14/21 1606 Last 3 Weights 08/14/2021 07/30/2021 07/29/2021  Weight (lbs) 147 lb 11.3 oz 149 lb 1.6 oz 132 lb 9.6 oz  Weight (kg) 67 kg 67.631 kg 60.147 kg     Body mass index is 21.81 kg/m.  General:  Well nourished, well developed, in no acute distress HEENT: normal Neck: no JVD Vascular: No carotid bruits; Distal pulses 2+ bilaterally Cardiac:  normal S1, S2; irregularly irregular; no murmur  Lungs:   Diminished breath sounds in bilateral lung, no obvious wheezing or crackles. Abd: soft, nontender, no hepatomegaly  Ext: no edema Musculoskeletal:  No deformities, BUE and BLE strength normal and equal Skin: warm and dry  Neuro:  CNs 2-12 intact, no focal abnormalities noted Psych:  Normal affect   EKG:  The EKG was personally reviewed and demonstrates: Atrial fibrillation with RVR Telemetry:  Telemetry was personally reviewed and demonstrates: Atrial fibrillation, heart rate has improved to 70s to 80s range.  Relevant CV Studies:  Echo 07/28/2021 1. Left ventricular ejection fraction, by estimation, is 60 to 65%. The  left ventricle has normal function. The left ventricle has no regional  wall motion abnormalities. Left ventricular diastolic function could not  be evaluated.   2. Right ventricular systolic function is normal. The right ventricular  size is moderately enlarged. There is mildly elevated pulmonary artery  systolic pressure.   3. Right atrial size was mildly dilated.   4. The mitral valve is grossly normal. No evidence of mitral valve  regurgitation.   5.  Tricuspid valve regurgitation is mild to moderate.   6. The aortic valve is tricuspid. Aortic valve regurgitation is not  visualized. No aortic stenosis is present.   7. The inferior vena cava is normal in size with greater than 50%  respiratory variability, suggesting right atrial pressure of 3 mmHg.   Comparison(s): No significant change from prior study. Prior images  reviewed side by side.   Laboratory Data:  High Sensitivity Troponin:   Recent Labs  Lab 07/27/21 1213 07/27/21 1416  TROPONINIHS 7 8     Chemistry Recent Labs  Lab 08/14/21 0915  NA 134*  K 4.3  CL 97*  CO2 28  GLUCOSE 131*  BUN 15  CREATININE 0.59*  CALCIUM 8.4*  GFRNONAA >60  ANIONGAP 9    No results for input(s): PROT, ALBUMIN, AST, ALT, ALKPHOS, BILITOT in the last 168 hours. Lipids No results for input(s): CHOL, TRIG, HDL, LABVLDL, LDLCALC, CHOLHDL in the last 168 hours.  Hematology Recent Labs  Lab 08/14/21 0915  WBC 19.3*  RBC 3.98*  HGB 11.6*  HCT 36.8*  MCV 92.5  MCH 29.1  MCHC 31.5  RDW 13.9  PLT 295   Thyroid No results for input(s): TSH, FREET4 in the last 168 hours.  BNP Recent Labs  Lab 08/14/21 0915  BNP 229.0*    DDimer No results for input(s): DDIMER in the last 168 hours.   Radiology/Studies:  DG Chest Port 1 View  Result Date: 08/14/2021 CLINICAL DATA:  Shortness of breath since yesterday EXAM: PORTABLE CHEST 1 VIEW COMPARISON:  Chest radiograph 07/27/2021, CTA chest 07/27/2021 FINDINGS: Cardiomediastinal silhouette is grossly stable. Confluent opacities in the  right upper lobe and right perihilar region have worsened. Patchy opacities with internal lucencies in the right lower lobe have also worsened. There is a small right pleural effusion, increased in size. There is no definite right pneumothorax. The left lung is clear. There is no left pleural effusion or pneumothorax. There is no acute osseous abnormality. IMPRESSION: Overall worsened aeration of the right lung  with increased opacities in the right upper and lower lung and perihilar region, and increased size of a right pleural effusion. Given findings on the CT chest from 07/27/2021, findings are concerning for worsened pneumonia with possible areas of cavitation. Electronically Signed   By: Valetta Mole M.D.   On: 08/14/2021 09:19     Assessment and Plan:   New onset of atrial fibrillation with RVR  -Occurred in the setting of pneumonia and COPD exacerbation.  -Based on the EKG, patient was clearly in sinus rhythm on 07/27/2021, after discharge from the last admission, patient was discharged on 10 mg twice daily of Eliquis for 6 days before transitioning to 5 mg twice daily either afterward.  He has been compliant with anticoagulation therapy.  -His heart rate seems to be very well controlled on IV diltiazem.  Since A. fib occurred while he has been on Eliquis, we can potentially consider cardioversion at any time.  However given his worsening pneumonia seen on CT, I would prefer more rate control therapy and defer cardioversion at a later date.  Acute diastolic heart failure: In the setting of atrial fibrillation with RVR.  He clearly has 1-2+ pitting edema bilaterally.  This is further exacerbated by low albumin level.  He has been treated with a single dose of 20 mg IV Lasix in the emergency room.   Pneumonia/COPD exacerbation: Per primary team.  Worsening opacities seen on CT image on the right side of the lung  Recently diagnosed PE: Diagnosed during recent admission in early December.  He has been on Eliquis since  CAD: Denies any recent anginal symptom  Hypertension  Hyperlipidemia    Risk Assessment/Risk Scores:        New York Heart Association (NYHA) Functional Class NYHA Class IV  CHA2DS2-VASc Score = 5   This indicates a 7.2% annual risk of stroke. The patient's score is based upon: CHF History: 1 HTN History: 1 Diabetes History: 0 Stroke History: 0 Vascular Disease  History: 1 Age Score: 2 Gender Score: 0         For questions or updates, please contact [facility] Please consult [email or web address] for contact info under    Signed, Almyra Deforest, [place]  08/14/2021 4:06 PM   ATTENDING ATTESTATION:  After conducting a review of all available clinical information with the care team, interviewing the patient, and performing a physical exam, I agree with the findings and plan described in this note.   GEN: No acute distress.   Cardiac: RRR, no murmurs, rubs, or gallops.  Respiratory: Clear to auscultation bilaterally. GI: Soft, nontender, non-distended  MS: No edema; No deformity. Neuro:  Nonfocal   87M with severe COPD, mild CAD, HTN, HL, BPH, AF on Eliquis here with AF RVR and acute on chronic diastolic heart failure in setting of RUL pneumonia.  Rate control achieved with cardizem gtt. Will increase lasix to 40 IV BID.  Cont Eliquis.  Cont abx for PNA per primary team.  Lenna Sciara, MD Pager [phone]

## 2021-08-14 NOTE — Progress Notes (Signed)
Pharmacy Antibiotic Note [name] is a 86 y.o. male admitted on 08/14/2021 with pneumonia. Pharmacy has been consulted for Vancomycin dosing.  Patient has received cefepime and azithromycin per MD. Patient presenting with leukocytosis. Scr 0.59 - near baseline.  Now consulted to add cefepime.   Plan: Vancomycin 1500 mg IV x 1, then 750 mg q12hr (eAUC: 532) Add cefepime 2g IV q8hr  Follow up WBC, renal function, clinical course. Deescalate when able. Levels at steady state. Add MRSA PCR.   Height: 5\' 9"  (175.3 cm) Weight: 67 kg (147 lb 11.3 oz) IBW/kg (Calculated) : 70.7  Temp (24hrs), Avg:98.8 F (37.1 C), Min:98.8 F (37.1 C), Max:98.8 F (37.1 C)  Recent Labs  Lab 08/14/21 0915 08/14/21 1114  WBC 19.3*  --   CREATININE 0.59*  --   LATICACIDVEN  --  1.5     Estimated Creatinine Clearance: 65.1 mL/min (A) (by C-G formula based on SCr of 0.59 mg/dL (L)).    Allergies  Allergen Reactions   Tolectin [Tolmetin Sodium] Other (See Comments)    BP low and passed out   Montelukast Sodium Other (See Comments)    Unknown reaction   Nsaids Other (See Comments)    MD told pt not to use   Augmentin [Amoxicillin-Pot Clavulanate] Nausea And Vomiting    Did it involve swelling of the face/tongue/throat, SOB, or low BP? No Did it involve sudden or severe rash/hives, skin peeling, or any reaction on the inside of your mouth or nose? No Did you need to seek medical attention at a hospital or doctor's office? No When did it last happen? Last year   If all above answers are "NO", may proceed with cephalosporin use.   Neosporin [Bacitracin-Polymyxin B] Rash   Antimicrobials this admission: Vancomycin 12/30 >>  Microbiology results: 12/30 BCx: to be collected 12/30 UCx: to be collected  12/30 MRSA PCR: ordered  Thank you for allowing pharmacy to be a part of this patients care.  Debbora Presto PharmD Candidate 08/14/2021 12:39 PM

## 2021-08-14 NOTE — ED Triage Notes (Signed)
Pt arrives via EMS from home with SOB since yesterday. Pt recently here with PE and PNA. EMS given 10mg  albuterol, 1 mg atrovent, 125 mg solu-medrol. Pt was 92% on home 5L.

## 2021-08-14 NOTE — ED Notes (Signed)
Report attempted 

## 2021-08-14 NOTE — ED Notes (Signed)
Pt reports having trouble swallowing pills

## 2021-08-14 NOTE — H&P (Signed)
HISTORY AND PHYSICAL       PATIENT DETAILS Name: [name] Age: 86 y.o. Sex: male Date of Birth: 10/08/1934 Admit Date: 08/14/2021 JXB:JYNW, Tiffany L, DO   Patient coming from: Home   CHIEF COMPLAINT:  Worsening shortness of breath and tachycardia since yesterday.  HPI: [name] is a 86 y.o. male with medical history significant of COPD with chronic hypoxemic respiratory failure on home O2, CAD, HTN, HLD, BPH-who was just discharged from this hospital on 12/15 after being treated for pneumonia/COPD exacerbation/pulmonary embolism-he was discharged home on anticoagulation, steroid taper and antibiotics.  Post discharge-he was doing well and gradually improving.  However yesterday around 5 PM-he started having shortness of breath, he noticed that when he checked his pulse ox-his heart rate was in the 140s-150s range.  Since he continued to have persistent tachycardia on subsequent checks-he presented to the emergency room for further evaluation and treatment.  He denies any fever-continues to have occasional productive cough.  No nausea, vomiting or diarrhea.  Per patient-he has also noticed significant worsening of his chronic lower extremity edema over the past few days.  ED Course: Initially requiring up to 8 L of oxygen-CXR with significant right-sided infiltrates-found to have A. fib with RVR.  Given vancomycin/cefepime/placed on Cardizem infusion-TRH consulted.  Note: Lives at: Home Mobility: Independent Chronic Indwelling Foley:no   REVIEW OF SYSTEMS:  Constitutional:   No  weight loss, night sweats,  Fevers, chills, fatigue.  HEENT:    No headaches, Dysphagia,Tooth/dental problems,Sore throat,  No sneezing, itching, ear ache, nasal congestion, post nasal drip  Cardio-vascular: No chest pain,Orthopnea, PND, anasarca  GI:  No heartburn, indigestion, abdominal pain, nausea, vomiting, diarrhea, melena or hematochezia  Resp: No  hemoptysis,plueritic chest pain.   Skin:  No rash or lesions.  GU:  No dysuria, change in color of urine, no urgency or frequency.  No flank pain.  Musculoskeletal: No joint pain or swelling.  No decreased range of motion.  No back pain.  Endocrine: No heat intolerance, no cold intolerance, no polyuria, no polydipsia  Psych: No change in mood or affect. No depression or anxiety.  No memory loss.   ALLERGIES:   Allergies  Allergen Reactions   Tolectin [Tolmetin Sodium] Other (See Comments)    BP low and passed out   Montelukast Sodium Other (See Comments)    Unknown reaction   Nsaids Other (See Comments)    MD told pt not to use   Augmentin [Amoxicillin-Pot Clavulanate] Nausea And Vomiting    Did it involve swelling of the face/tongue/throat, SOB, or low BP? No Did it involve sudden or severe rash/hives, skin peeling, or any reaction on the inside of your mouth or nose? No Did you need to seek medical attention at a hospital or doctor's office? No When did it last happen? Last year   If all above answers are "NO", may proceed with cephalosporin use.   Neosporin [Bacitracin-Polymyxin B] Rash    PAST MEDICAL HISTORY: Past Medical History:  Diagnosis Date   Allergic rhinitis, cause unspecified    Arthritis    COPD (chronic obstructive pulmonary disease) (HCC)    Dyspnea    Dysrhythmia    "1 episode of tachycardia in 1980's, been on it ever since"   Emphysema of lung ([place])    Hypertension    Left inguinal hernia 02/07/2019   Low hemoglobin    low level   Neuromuscular disorder ([place])  Neuropathy    feet   Pneumonia    History of, last Oct 2019   Pulmonary nodule     PAST SURGICAL HISTORY: Past Surgical History:  Procedure Laterality Date   APPENDECTOMY  1943   CARDIAC CATHETERIZATION  2003   performed at [facility], Dr. Fransico Him   CATARACT EXTRACTION  2008   Dr. Darleen Crocker   CATARACT EXTRACTION, BILATERAL     EYE SURGERY     INGUINAL HERNIA REPAIR Left  02/07/2019   Procedure: OPEN REPAIR LEFT INGUINAL HERNIA WITH MESH;  Surgeon: Fanny Skates, MD;  Location: [facility];  Service: General;  Laterality: Left;  SPINAL AND TAP BLOCK ANESTHESIA   ROTATOR CUFF REPAIR  2002   Dr. Kathryne Hitch   TONSILLECTOMY  1945   VASECTOMY      MEDICATIONS AT HOME: Prior to Admission medications   Medication Sig Start Date End Date Taking? Authorizing Provider  acetaminophen (TYLENOL) 650 MG CR tablet Take 1,300 mg by mouth See admin instructions. Take 2 tablets (1300 mg) by mouth every morning, may also take 1-2 tablets ([phone] mg) at 4p-5p if needed for pain   Yes [provider]  apixaban (ELIQUIS) 5 MG TABS tablet Take 1-2 tablets (5-10 mg total) by mouth 2 (two) times daily. Take 2 tablets (10mg ) by mouth 2 (two) time daily for 6 days with first dose 12/15 PM then on 12/21 PM reduce dose to 1 tablet (5mg ) by mouth 2 (two) time daily Patient taking differently: Take 5 mg by mouth 2 (two) times daily. Take 2 tablets (10mg ) by mouth 2 (two) time daily for 6 days with first dose 12/15 PM then on 12/21 PM reduce dose to 1 tablet (5mg ) by mouth 2 (two) time daily 07/30/21  Yes Donne Hazel, MD  BREZTRI AEROSPHERE 160-9-4.8 MCG/ACT AERO INHALE 2 PUFFS INTO THE LUNGS IN THE MORNING AND AT BEDTIME Patient taking differently: Inhale 2 puffs into the lungs in the morning and at bedtime. 09/12/20  Yes Young, Tarri Fuller D, MD  fluticasone (FLONASE) 50 MCG/ACT nasal spray Place 1 spray into both nostrils daily as needed for allergies or rhinitis.   Yes [provider]  guaiFENesin (MUCINEX) 600 MG 12 hr tablet Take 600 mg by mouth 2 (two) times daily as needed for cough or to loosen phlegm.   Yes [provider]  ipratropium-albuterol (DUONEB) 0.5-2.5 (3) MG/3ML SOLN Take 3 mLs by nebulization every 6 (six) hours as needed (for SOB AND wheezing every 6 hours AND PRN). Patient taking differently: Take 3 mLs by nebulization every 6 (six) hours as  needed (wheezing/shortness of breath). 02/23/21  Yes Young, Tarri Fuller D, MD  ketoconazole (NIZORAL) 2 % shampoo Apply 1 application topically 3 (three) times a week. 05/01/20  Yes [provider]  levalbuterol (XOPENEX HFA) 45 MCG/ACT inhaler Inhale 2 puffs every 6 hours as needed- rescue Patient taking differently: Inhale 2 puffs into the lungs every 6 (six) hours as needed for wheezing or shortness of breath. 07/21/21  Yes Young, Tarri Fuller D, MD  losartan (COZAAR) 25 MG tablet Take 1 tablet (25 mg total) by mouth daily. Please keep upcoming appt in April 2022 with Dr. Burt Knack before anymore refills. Thank you Patient taking differently: Take 25 mg by mouth every morning. 08/25/20  Yes Reed, Tiffany L, DO  melatonin 5 MG TABS Take 5 mg by mouth at bedtime as needed (sleep).   Yes [provider]  Nutritional Supplements (ENSURE HIGH PROTEIN) LIQD Take 237 oz  by mouth daily.   Yes [provider]  OXYGEN Inhale 3-6 L into the lungs continuous. 3 at rest, 4-5 with exertion, 6 during shower   Yes [provider]  Polyethyl Glycol-Propyl Glycol (SYSTANE) 0.4-0.3 % SOLN Place 1 drop into both eyes 2 (two) times daily as needed (dry eyes).   Yes [provider]  polyethylene glycol powder (GLYCOLAX/MIRALAX) powder Take 17 g by mouth every morning.   Yes [provider]  pravastatin (PRAVACHOL) 40 MG tablet TAKE 1 TABLET(40 MG) BY MOUTH DAILY Patient taking differently: Take 40 mg by mouth every evening. 10/23/20  Yes Reed, Tiffany L, DO  tamsulosin (FLOMAX) 0.4 MG CAPS capsule Take two capsules by mouth once daily. Patient taking differently: 0.4 mg every evening. 01/14/21  Yes Wardell Honour, MD  vitamin B-12 (CYANOCOBALAMIN) 1000 MCG tablet Take 1 tablet (1,000 mcg total) by mouth daily. Patient taking differently: Take 1,000 mcg by mouth daily with lunch. 10/19/19  Yes Reed, Tiffany L, DO    FAMILY HISTORY: Family History  Problem Relation Age of Onset    COPD Mother    Stroke Mother    Stroke Father    Coronary artery disease Other        [facility]      SOCIAL HISTORY:  reports that he quit smoking about 31 years ago. His smoking use included cigarettes. He has a 76.00 pack-year smoking history. He has never used smokeless tobacco. He reports current alcohol use of about 7.0 standard drinks per week. He reports that he does not use drugs.  PHYSICAL EXAM: Blood pressure 112/61, pulse (!) 103, temperature 98.8 F (37.1 C), temperature source Rectal, resp. rate (!) 35, height 5\' 9"  (1.753 m), weight 67 kg, SpO2 97 %.  General appearance :Awake, alert, not in any distress.  Eyes:, pupils equally reactive to light and accomodation,no scleral icterus.Pink conjunctiva HEENT: Atraumatic and Normocephalic Neck: supple, no JVD.  Resp:Good air entry bilaterally, no added sounds  CVS: S1 S2 irregular/tachycardic. GI: Bowel sounds present, Non tender and not distended with no gaurding, rigidity or rebound. Extremities: B/L Lower Ext with 3+ pitting edema Neurology:  speech clear,Non focal, sensation is grossly intact. Psychiatric: Normal judgment and insight. Alert and oriented x 3. Normal mood. Musculoskeletal:gait appears to be normal.No digital cyanosis Skin:No Rash, warm and dry Wounds:N/A  LABS ON ADMISSION:  I have personally reviewed following labs and imaging studies  CBC: Recent Labs  Lab 08/14/21 0915  WBC 19.3*  NEUTROABS 17.4*  HGB 11.6*  HCT 36.8*  MCV 92.5  PLT 397    Basic Metabolic Panel: Recent Labs  Lab 08/14/21 0915  NA 134*  K 4.3  CL 97*  CO2 28  GLUCOSE 131*  BUN 15  CREATININE 0.59*  CALCIUM 8.4*    GFR: Estimated Creatinine Clearance: 65.1 mL/min (A) (by C-G formula based on SCr of 0.59 mg/dL (L)).  Liver Function Tests: No results for input(s): AST, ALT, ALKPHOS, BILITOT, PROT, ALBUMIN in the last 168 hours. No results for input(s): LIPASE, AMYLASE in the last 168 hours. No results for input(s):  AMMONIA in the last 168 hours.  Coagulation Profile: Recent Labs  Lab 08/14/21 1114  INR 1.2    Cardiac Enzymes: No results for input(s): CKTOTAL, CKMB, CKMBINDEX, TROPONINI in the last 168 hours.  BNP (last 3 results) No results for input(s): PROBNP in the last 8760 hours.  HbA1C: No results for input(s): HGBA1C in the last 72 hours.  CBG: No results for  input(s): GLUCAP in the last 168 hours.  Lipid Profile: No results for input(s): CHOL, HDL, LDLCALC, TRIG, CHOLHDL, LDLDIRECT in the last 72 hours.  Thyroid Function Tests: No results for input(s): TSH, T4TOTAL, FREET4, T3FREE, THYROIDAB in the last 72 hours.  Anemia Panel: No results for input(s): VITAMINB12, FOLATE, FERRITIN, TIBC, IRON, RETICCTPCT in the last 72 hours.  Urine analysis:    Component Value Date/Time   COLORURINE YELLOW 08/14/2021 1113   APPEARANCEUR HAZY (A) 08/14/2021 1113   LABSPEC 1.023 08/14/2021 1113   PHURINE 5.0 08/14/2021 1113   GLUCOSEU NEGATIVE 08/14/2021 1113   HGBUR MODERATE (A) 08/14/2021 1113   BILIRUBINUR NEGATIVE 08/14/2021 [place] 08/14/2021 1113   PROTEINUR 30 (A) 08/14/2021 1113   NITRITE NEGATIVE 08/14/2021 1113   LEUKOCYTESUR NEGATIVE 08/14/2021 1113    Sepsis Labs: Lactic Acid, Venous    Component Value Date/Time   LATICACIDVEN 1.5 08/14/2021 1114     Microbiology: Recent Results (from the past 240 hour(s))  Resp Panel by RT-PCR (Flu A&B, Covid) Nasopharyngeal Swab     Status: None   Collection Time: 08/14/21  8:59 AM   Specimen: Nasopharyngeal Swab; Nasopharyngeal(NP) swabs in vial transport medium  Result Value Ref Range Status   SARS Coronavirus 2 by RT PCR NEGATIVE NEGATIVE Final    Comment: (NOTE) SARS-CoV-2 target nucleic acids are NOT DETECTED.  The SARS-CoV-2 RNA is generally detectable in upper respiratory specimens during the acute phase of infection. The lowest concentration of SARS-CoV-2 viral copies this assay can detect is 138  copies/mL. A negative result does not preclude SARS-Cov-2 infection and should not be used as the sole basis for treatment or other patient management decisions. A negative result may occur with  improper specimen collection/handling, submission of specimen other than nasopharyngeal swab, presence of viral mutation(s) within the areas targeted by this assay, and inadequate number of viral copies(<138 copies/mL). A negative result must be combined with clinical observations, patient history, and epidemiological information. The expected result is Negative.  Fact Sheet for Patients:  EntrepreneurPulse.com.au  Fact Sheet for Healthcare Providers:  IncredibleEmployment.be  This test is no t yet approved or cleared by the [place] FDA and  has been authorized for detection and/or diagnosis of SARS-CoV-2 by FDA under an Emergency Use Authorization (EUA). This EUA will remain  in effect (meaning this test can be used) for the duration of the COVID-19 declaration under Section 564(b)(1) of the Act, 21 U.S.C.section 360bbb-3(b)(1), unless the authorization is terminated  or revoked sooner.       Influenza A by PCR NEGATIVE NEGATIVE Final   Influenza B by PCR NEGATIVE NEGATIVE Final    Comment: (NOTE) The Xpert Xpress SARS-CoV-2/FLU/RSV plus assay is intended as an aid in the diagnosis of influenza from Nasopharyngeal swab specimens and should not be used as a sole basis for treatment. Nasal washings and aspirates are unacceptable for Xpert Xpress SARS-CoV-2/FLU/RSV testing.  Fact Sheet for Patients: EntrepreneurPulse.com.au  Fact Sheet for Healthcare Providers: IncredibleEmployment.be  This test is not yet approved or cleared by the [place] FDA and has been authorized for detection and/or diagnosis of SARS-CoV-2 by FDA under an Emergency Use Authorization (EUA). This EUA will remain in effect (meaning  this test can be used) for the duration of the COVID-19 declaration under Section 564(b)(1) of the Act, 21 U.S.C. section 360bbb-3(b)(1), unless the authorization is terminated or revoked.  Performed at [facility], [place] [address] [place], [place] [address]  RADIOLOGIC STUDIES ON ADMISSION: DG Chest Port 1 View  Result Date: 08/14/2021 CLINICAL DATA:  Shortness of breath since yesterday EXAM: PORTABLE CHEST 1 VIEW COMPARISON:  Chest radiograph 07/27/2021, CTA chest 07/27/2021 FINDINGS: Cardiomediastinal silhouette is grossly stable. Confluent opacities in the right upper lobe and right perihilar region have worsened. Patchy opacities with internal lucencies in the right lower lobe have also worsened. There is a small right pleural effusion, increased in size. There is no definite right pneumothorax. The left lung is clear. There is no left pleural effusion or pneumothorax. There is no acute osseous abnormality. IMPRESSION: Overall worsened aeration of the right lung with increased opacities in the right upper and lower lung and perihilar region, and increased size of a right pleural effusion. Given findings on the CT chest from 07/27/2021, findings are concerning for worsened pneumonia with possible areas of cavitation. Electronically Signed   By: Valetta Mole M.D.   On: 08/14/2021 09:19    I have personally reviewed images of chest xray: Extensive right-sided infiltrate.   EKG:  Personally reviewed.  Atrial fibrillation with RVR  ASSESSMENT AND PLAN: Acute on chronic hypoxemic respiratory failure: Suspect due to HCAP-plan is to continue cefepime/vancomycin-and titrate down FiO2 to his usual home regimen.  Note-he initially required up to 8 L of oxygen when he first presented-but seems to have improved while in the emergency room-requiring around 4-5 L of oxygen at rest.  At baseline he requires 3 L at rest-and 5 L with ambulation.  HCAP: Significant worsening of right-sided  infiltrates-he denies any history of dysphagia/GERD-we will continue cefepime/vancomycin-await cultures-have SLP reevaluate to see if patient needs any further studies.  Empirically will place on PPI.  A. fib with RVR: Likely provoked by pneumonia-continue Cardizem infusion-already on Eliquis.  Recent echo earlier this month with preserved EF.  Have consulted cardiology.  Significant lower extremity edema: Likely due to combination of third spacing from hypoalbuminemia and decompensated diastolic heart failure due to A. fib RVR.  Blood pressure in the low 100 range-starting IV Lasix-watch BP closely.  Recent history of pulmonary embolism: Continue Eliquis.  COPD: Not in any exacerbation-we will place him on bronchodilators  HTN-hold oral BP meds-on IV cardizem infusion  HLD: continue Statin  BPH: Flomax   Further plan will depend as patient's clinical course evolves and further radiologic and laboratory data become available. Patient will be monitored closely.  Above noted plan was discussed with patient face to face at bedside,he was in agreement.   CONSULTS: Cards  DVT Prophylaxis: Eliquis  Code Status: Full Code (Initially claimed he was DNR-but upon further questioning-he is undecided over intubation-have asked him to think about it/discuss w family-and we will need re-assess in the next day or so)  Disposition Plan:  Discharge back home  possibly in 3-4 days, depending on clinical course  Admission status:  Inpatient  going to SDU   The medical decision making on this patient was of high complexity and the patient is at high risk for clinical deterioration, therefore this is a level 3 visit.    Total time spent  55 minutes.Greater than 50% of this time was spent in counseling, explanation of diagnosis, planning of further management, and coordination of care.  Severity of illness: The appropriate patient status for this patient is INPATIENT. Inpatient status is judged to  be reasonable and necessary in order to provide the required intensity of service to ensure the patient's safety. The patient's presenting symptoms, physical exam findings, and initial radiographic  and laboratory data in the context of their chronic comorbidities is felt to place them at high risk for further clinical deterioration. Furthermore, it is not anticipated that the patient will be medically stable for discharge from the hospital within 2 midnights of admission. The following factors support the patient status of inpatient.   " The patient's presenting symptoms include SOB/Tachycards " The worrisome physical exam findings include Afib RVR " The initial radiographic and laboratory data are worrisome because of Rt sided infliltrates. " The chronic co-morbidities include COPD on home O2.   * I certify that at the point of admission it is my clinical judgment that the patient will require inpatient hospital care spanning beyond 2 midnights from the point of admission due to high intensity of service, high risk for further deterioration and high frequency of surveillance required.  Oren Binet [facility] Pager [phone]  If 7PM-7AM, please contact night-coverage  Please page via [email or web address]  Go to [email or web address] and use [place]'s universal password to access. If you do not have the password, please contact the hospital operator.  Locate the [facility] provider you are looking for under [facility] and page to a number that you can be directly reached. If you still have difficulty reaching the provider, please page the [facility] (Director on Call) for the Hospitalists listed on amion for assistance.  08/14/2021, 12:34 PM

## 2021-08-14 NOTE — Plan of Care (Signed)
°  Problem: Activity: Goal: Ability to tolerate increased activity will improve Outcome: Progressing   Problem: Activity: Goal: Ability to tolerate increased activity will improve Outcome: Progressing

## 2021-08-15 ENCOUNTER — Inpatient Hospital Stay (HOSPITAL_COMMUNITY): Payer: Medicare Other

## 2021-08-15 DIAGNOSIS — I480 Paroxysmal atrial fibrillation: Secondary | ICD-10-CM

## 2021-08-15 DIAGNOSIS — J9601 Acute respiratory failure with hypoxia: Secondary | ICD-10-CM

## 2021-08-15 DIAGNOSIS — I5031 Acute diastolic (congestive) heart failure: Secondary | ICD-10-CM | POA: Diagnosis not present

## 2021-08-15 LAB — CBC
HCT: 31.4 % — ABNORMAL LOW (ref 39.0–52.0)
Hemoglobin: 9.9 g/dL — ABNORMAL LOW (ref 13.0–17.0)
MCH: 28.2 pg (ref 26.0–34.0)
MCHC: 31.5 g/dL (ref 30.0–36.0)
MCV: 89.5 fL (ref 80.0–100.0)
Platelets: 211 10*3/uL (ref 150–400)
RBC: 3.51 MIL/uL — ABNORMAL LOW (ref 4.22–5.81)
RDW: 13.9 % (ref 11.5–15.5)
WBC: 14.5 10*3/uL — ABNORMAL HIGH (ref 4.0–10.5)
nRBC: 0 % (ref 0.0–0.2)

## 2021-08-15 LAB — COMPREHENSIVE METABOLIC PANEL
ALT: 13 U/L (ref 0–44)
AST: 12 U/L — ABNORMAL LOW (ref 15–41)
Albumin: 2.1 g/dL — ABNORMAL LOW (ref 3.5–5.0)
Alkaline Phosphatase: 45 U/L (ref 38–126)
Anion gap: 8 (ref 5–15)
BUN: 18 mg/dL (ref 8–23)
CO2: 26 mmol/L (ref 22–32)
Calcium: 8.1 mg/dL — ABNORMAL LOW (ref 8.9–10.3)
Chloride: 100 mmol/L (ref 98–111)
Creatinine, Ser: 0.46 mg/dL — ABNORMAL LOW (ref 0.61–1.24)
GFR, Estimated: >60 mL/min (ref >60)
Glucose, Bld: 145 mg/dL — ABNORMAL HIGH (ref 70–99)
Potassium: 3.9 mmol/L (ref 3.5–5.1)
Sodium: 134 mmol/L — ABNORMAL LOW (ref 135–145)
Total Bilirubin: 0.6 mg/dL (ref 0.3–1.2)
Total Protein: 5.1 g/dL — ABNORMAL LOW (ref 6.5–8.1)

## 2021-08-15 LAB — URINE CULTURE: Culture: NO GROWTH

## 2021-08-15 LAB — STREP PNEUMONIAE URINARY ANTIGEN: Strep Pneumo Urinary Antigen: NEGATIVE

## 2021-08-15 LAB — TSH: TSH: 0.526 u[IU]/mL (ref 0.350–4.500)

## 2021-08-15 MED ORDER — METHYLPREDNISOLONE SODIUM SUCC 125 MG IJ SOLR
60.0000 mg | Freq: Two times a day (BID) | INTRAMUSCULAR | Status: DC
Start: 2021-08-15 — End: 2021-08-16
  Administered 2021-08-15 – 2021-08-16 (×2): 60 mg via INTRAVENOUS
  Filled 2021-08-15 (×2): qty 2

## 2021-08-15 MED ORDER — PREDNISONE 20 MG PO TABS
40.0000 mg | ORAL_TABLET | Freq: Every day | ORAL | Status: DC
  Administered 2021-08-15: 40 mg via ORAL
  Filled 2021-08-15: qty 2

## 2021-08-15 MED ORDER — GUAIFENESIN ER 600 MG PO TB12
1200.0000 mg | ORAL_TABLET | Freq: Two times a day (BID) | ORAL | Status: DC
  Administered 2021-08-15 – 2021-08-21 (×14): 1200 mg via ORAL
  Filled 2021-08-15 (×14): qty 2

## 2021-08-15 MED ORDER — HYDROCODONE BIT-HOMATROP MBR 5-1.5 MG/5ML PO SOLN: 5.0000 mL | ORAL | Status: DC | PRN

## 2021-08-15 NOTE — Evaluation (Signed)
Physical Therapy Evaluation Patient Details Name: [name] MRN: [id number] DOB: 02-10-1935 Today's Date: 08/15/2021  History of Present Illness  The pt is an 86 yo male presenting 12/30 with SOB and tachycardia. Upon work up, pt with increased size of R pleural effusion and chest x-ray concerning for pneumonia. Cardiology consulted for new onset afib in setting of COPD exacerbation. PMH includes: COPD on 3L at rest and 4-6L O2 with exertion, CAD, HTN, HLD, and BPH.   Clinical Impression  Pt in bed upon arrival of PT, agreeable to evaluation at this time. Prior to admission the pt was mobilizing shrot distances without use of DME in the home, but recently has had progressive decline and was limited to even shorter distances. The pt now presents with limitations in functional mobility, strength, and activity tolerance due to above dx, and will continue to benefit from skilled PT to address these deficits. The pt was able to demo good mobility of LE against gravity, and performed partial supine to long-sit transition to allow for changing of gown, however, the pt demos increased anxiety with offer of bed-level mobility and then declined any attempts to transition to sitting EOB or complete OOB mobility at this time. HR remained 89-100bpm with SpO2 stable on 4L, pt noticeably SOB with conversation attempts. Will continue to benefit from skilled PT to progress activity tolerance and OOB mobility. Given deficits, pt will likely need more assist than wife is able to provide and will therefore benefit from short stint SNF rehab prior to return home.        Recommendations for follow up therapy are one component of a multi-disciplinary discharge planning process, led by the attending physician.  Recommendations may be updated based on patient status, additional functional criteria and insurance authorization.  Follow Up Recommendations Skilled nursing-short term rehab (<3 hours/day)    Assistance  Recommended at Discharge Frequent or constant Supervision/Assistance  Functional Status Assessment Patient has had a recent decline in their functional status and demonstrates the ability to make significant improvements in function in a reasonable and predictable amount of time.  Equipment Recommendations  None recommended by PT    Recommendations for Other Services       Precautions / Restrictions Precautions Precautions: Fall Precaution Comments: monitor O2-O2 dep Restrictions Weight Bearing Restrictions: No      Mobility  Bed Mobility Overal bed mobility: Needs Assistance Bed Mobility: Supine to Sit;Sit to Supine     Supine to sit: Min guard Sit to supine: Min guard   General bed mobility comments: pt coming to partial long-sitting in bed, then became anxious and declined all further mobility, per OT, he was able to come to sitting EOB without assist    Transfers                   General transfer comment: declines at this time      Balance Overall balance assessment: Needs assistance Sitting-balance support: Bilateral upper extremity supported;Feet supported Sitting balance-Leahy Scale: Fair                                       Pertinent Vitals/Pain Pain Assessment: No/denies pain    Home Living Family/patient expects to be discharged to:: Private residence Living Arrangements: Spouse/significant other Available Help at Discharge: Family;Available 24 hours/day Type of Home: House Home Access: Stairs to enter   [employer] of Steps: 1  Home Layout: Two level;Able to live on main level with bedroom/bathroom Home Equipment: Shower seat - built in;Rollator (4 wheels);Grab bars - tub/shower;Grab bars - toilet Additional Comments: reports hiring some CNA help and getting a bath 2 times per week. pt reports HHPT starting wed 12/28 prior to admission. pt reports two weeks without PT due to address mix up and laying in bed in PJs  for two weeks due to feeling poorly. pt reports his wife doesnt get up til 10 am and so he gets up and waits for her to have breakfast when she can help him    Prior Function Prior Level of Function : Needs assist             Mobility Comments: reports no DME from bedroom to sun room and bathroom. pt reports SOB and fatigue with transfers last two weeks. pt reports previously no difficulty ADLs Comments: pt requires CNA help for adls ( bathing dressing) pt reports wearing PJs due to decreased mobility and SOB     Hand Dominance   Dominant Hand: Right    Extremity/Trunk Assessment   Upper Extremity Assessment Upper Extremity Assessment: Generalized weakness    Lower Extremity Assessment Lower Extremity Assessment: Generalized weakness;RLE deficits/detail;LLE deficits/detail RLE Deficits / Details: pitting edema at foot 4+ and calf 3+ pt able to complete ankle pumps, heel slides, SLR, and hip abd/add with no issue or pain RLE Sensation: decreased light touch LLE Deficits / Details: edema at ankle less than R LE and edema in the calf. able to move at all joints against gravity LLE Sensation: decreased light touch    Cervical / Trunk Assessment Cervical / Trunk Assessment: Normal  Communication   Communication: No difficulties  Cognition Arousal/Alertness: Awake/alert Behavior During Therapy: Anxious Overall Cognitive Status: Impaired/Different from baseline Area of Impairment: Orientation;Memory;Awareness                 Orientation Level: Disoriented to;Time   Memory: Decreased short-term memory     Awareness: Intellectual   General Comments: pt with noted STM deficits and benefits from cues for sequencing tasks and problem solving. Pt with increased anxiety and easily overwhelmed when asked to perform OOB mobility        General Comments General comments (skin integrity, edema, etc.): VSS on 4L, HR 89-100 with supine movement    Exercises General Exercises  - Lower Extremity Ankle Circles/Pumps: AROM;Both;15 reps;Supine Quad Sets: AROM;Both;5 reps;Supine Heel Slides: AROM;Both;10 reps;Supine Hip ABduction/ADduction: AROM;Both;5 reps;Supine Straight Leg Raises: AROM;Both;5 reps;Supine Other Exercises Other Exercises: ankle pumps   Assessment/Plan    PT Assessment Patient needs continued PT services  PT Problem List Decreased strength;Decreased mobility;Decreased activity tolerance;Decreased balance;Decreased knowledge of use of DME       PT Treatment Interventions DME instruction;Gait training;Therapeutic activities;Therapeutic exercise;Patient/family education;Balance training;Functional mobility training    PT Goals (Current goals can be found in the Care Plan section)  Acute Rehab PT Goals Patient Stated Goal: to improve breathing PT Goal Formulation: With patient/family Time For Goal Achievement: 08/12/21 Potential to Achieve Goals: Good    Frequency Min 3X/week   Barriers to discharge Decreased caregiver support         AM-PAC PT "6 Clicks" Mobility  Outcome Measure Help needed turning from your back to your side while in a flat bed without using bedrails?: A Little Help needed moving from lying on your back to sitting on the side of a flat bed without using bedrails?: A Little Help needed moving to and  from a bed to a chair (including a wheelchair)?: A Lot Help needed standing up from a chair using your arms (e.g., wheelchair or bedside chair)?: A Lot Help needed to walk in hospital room?: Total Help needed climbing 3-5 steps with a railing? : Total 6 Click Score: 12    End of Session   Activity Tolerance: Other (comment) (pt self-limiting due to anxiety) Patient left: in bed;with call bell/phone within reach Nurse Communication: Mobility status (IV needs attention (leaking)) PT Visit Diagnosis: Unsteadiness on feet (R26.81);Other abnormalities of gait and mobility (R26.89);Muscle weakness (generalized) (M62.81)     Time: 6578-4696 PT Time Calculation (min) (ACUTE ONLY): 27 min   Charges:   PT Evaluation $PT Eval Low Complexity: 1 Low PT Treatments $Therapeutic Exercise: 8-22 mins        [place] Carbo, PT, DPT   Acute Rehabilitation Department Pager #: [phone]  Sandra Cockayne 08/15/2021, 3:52 PM

## 2021-08-15 NOTE — Evaluation (Signed)
Clinical/Bedside Swallow Evaluation Patient Details  Name: [name] MRN: [id number] Date of Birth: 1935-01-22  Today's Date: 08/15/2021 Time: SLP Start Time (ACUTE ONLY): 1010 SLP Stop Time (ACUTE ONLY): 4854 SLP Time Calculation (min) (ACUTE ONLY): 20 min  Past Medical History:  Past Medical History:  Diagnosis Date   Allergic rhinitis, cause unspecified    Arthritis    COPD (chronic obstructive pulmonary disease) ([place])    Dyspnea    Dysrhythmia    "1 episode of tachycardia in 1980's, been on it ever since"   Emphysema of lung ([place])    Hypertension    Left inguinal hernia 02/07/2019   Low hemoglobin    low level   Neuromuscular disorder ([place])    Neuropathy    feet   Pneumonia    History of, last Oct 2019   Pulmonary nodule    Past Surgical History:  Past Surgical History:  Procedure Laterality Date   [place]  2003   performed at [facility], Dr. Fransico Him   CATARACT EXTRACTION  2008   Dr. Darleen Crocker   CATARACT EXTRACTION, BILATERAL     EYE SURGERY     INGUINAL HERNIA REPAIR Left 02/07/2019   Procedure: [place];  Surgeon: Fanny Skates, MD;  Location: [facility];  Service: General;  Laterality: Left;  SPINAL AND TAP BLOCK ANESTHESIA   ROTATOR CUFF REPAIR  2002   Dr. Kathryne Hitch   TONSILLECTOMY  1945   VASECTOMY     HPI:  Pt is an 86 yo male adm to hospital with recurrent R upper lobe and lower lobe PNA and SOB.  Pt was admitted to [facility] two weeks ago for same issue.  Large airspace opacity is noted in right upper and lower lobes concerning for pneumonia and cavitation, small sliding hiatal hernia. MBSS two weeks ago revealed grossly normal pharyngeal swallow function with single instance of audible aspiration that occured from residue after nectar thick liquids remaining in pyriform sinus during a trial of puree. At that time, discussion with pt/family (son) including risks/benefits of oral  diet was completed and regular/thin was decided on.    Assessment / Plan / Recommendation  Clinical Impression  Pt known to ST service from recent [facility] admission for respiratory difficulty. He then had R upper cavitation and lower lobe opacity, which have both increased despite outpatient abx. He is on 3L supplemental O2 via [place]. He appears cognitively sound and participated well in discussion regarding treatment plan. At this time, pt does not wish to repeat swallow study as findings are likely to be similar or the same to study two weeks ago. He continues to wish to pursue regular consistency solids and thin liquids, which appears safe given MBSS findings. He was educated on importance of diligent oral care to mitigate aspiration PNA and verbalized understanding and agreement. He was seen during medication administration and was noted with oral hold/lingual rocking with pills, which does not occur with food. He had intermittent throat clearing noted, which is likely r/t COPD and temperature changes versus penetration/aspiration.   Of note, pt also reports symptoms of an esophageal dysphagia (belching, globus at retrosternum), which may benefit from being evaluated to ensure reflux is not causing aspiration pneumonitis.  SLP service to follow up for EMST (airway protection and pharyngeal strengthening) as previously recommended during last stay.    SLP Visit Diagnosis: Dysphagia, oropharyngeal phase (R13.12)    Aspiration Risk  Moderate aspiration risk;Mild aspiration risk    Diet Recommendation Regular;Thin liquid   Liquid Administration via: Cup;Straw Medication Administration: Whole meds with puree Compensations: Slow rate;Small sips/bites;Follow solids with liquid Postural Changes: Seated upright at 90 degrees;Remain upright for at least 30 minutes after po intake    Other  Recommendations Recommended Consults: Consider GI evaluation Oral Care Recommendations: Oral care BID;Oral care QID     Recommendations for follow up therapy are one component of a multi-disciplinary discharge planning process, led by the attending physician.  Recommendations may be updated based on patient status, additional functional criteria and insurance authorization.  Follow up Recommendations Outpatient SLP      Assistance Recommended at Discharge PRN  Functional Status Assessment Patient has had a recent decline in their functional status and demonstrates the ability to make significant improvements in function in a reasonable and predictable amount of time.  Frequency and Duration min 2x/week  1 week       Prognosis Prognosis for Safe Diet Advancement: Good Barriers to Reach Goals: Motivation      Swallow Study   General Date of Onset: 08/14/21 HPI: Pt is an 86 yo male adm to hospital with recurrent R upper lobe and lower lobe PNA and SOB.  Pt was admitted to [facility] two weeks ago for same issue.  Large airspace opacity is noted in right upper and lower lobes concerning for pneumonia and cavitation, small sliding hiatal hernia. MBSS two weeks ago revealed grossly normal pharyngeal swallow function with single instance of audible aspiration that occured from residue after nectar thick liquids remaining in pyriform sinus during a trial of puree. At that time, discussion with pt/family (son) including risks/benefits of oral diet was completed and regular/thin was decided on. Type of Study: Bedside Swallow Evaluation Previous Swallow Assessment: MBSS 07/28/21 Diet Prior to this Study: Regular;Thin liquids Temperature Spikes Noted: No Respiratory Status: Nasal cannula History of Recent Intubation: No Behavior/Cognition: Alert;Cooperative;Pleasant mood Oral Cavity Assessment: Within Functional Limits Oral Cavity - Dentition: Adequate natural dentition Vision: Functional for self-feeding Self-Feeding Abilities: Able to feed self Patient Positioning: Upright in bed Baseline Vocal Quality:  Normal Volitional Cough: Strong Volitional Swallow: Able to elicit    Thin Liquid Thin Liquid: Within functional limits Presentation: Straw;Cup Pharyngeal  Phase Impairments: Cough - Delayed    Puree Puree: Within functional limits Presentation: Spoon   Solid     Solid: Within functional limits Presentation: [place]. Finnley Lar[place], M.S., CCC-SLP Speech-Language Pathologist Acute Rehabilitation Services Pager: [place] 08/15/2021,10:37 AM

## 2021-08-15 NOTE — Evaluation (Signed)
Occupational Therapy Evaluation Patient Details Name: [name] MRN: [id number] DOB: [birth year] Today's Date: 08/15/2021   History of Present Illness The pt is an 86 yo male presenting 12/30 with SOB and tachycardia. Upon work up, pt with increased size of R pleural effusion and chest x-ray concerning for pneumonia. Cardiology consulted for new onset afib in setting of COPD exacerbation. PMH includes: COPD on 3L at rest and 4-6L O2 with exertion, CAD, HTN, HLD, and BPH.   Clinical Impression   PT admitted with PNA. Pt currently with functional limitiations due to the deficits listed below (see OT problem list). Pt requires education for pursed lip breathing and encouragement to engage. Pt noted to have some cognitive deficits and needs minimal distraction environments.  Pt will benefit from skilled OT to increase their independence and safety with adls and balance to allow discharge SNF.       Recommendations for follow up therapy are one component of a multi-disciplinary discharge planning process, led by the attending physician.  Recommendations may be updated based on patient status, additional functional criteria and insurance authorization.   Follow Up Recommendations  Skilled nursing-short term rehab (<3 hours/day)    Assistance Recommended at Discharge Intermittent Supervision/Assistance  Functional Status Assessment  Patient has had a recent decline in their functional status and demonstrates the ability to make significant improvements in function in a reasonable and predictable amount of time.  Equipment Recommendations  BSC/3in1;Wheelchair (measurements OT);Wheelchair cushion (measurements OT);Other (comment) (02)    Recommendations for Other Services       Precautions / Restrictions Precautions Precautions: Fall Precaution Comments: monitor O2-O2 dep      Mobility Bed Mobility Overal bed mobility: Needs Assistance Bed Mobility: Supine to Sit;Sit to Supine      Supine to sit: Min guard Sit to supine: Min guard        Transfers                   General transfer comment: declines at this time      Balance Overall balance assessment: Needs assistance Sitting-balance support: Bilateral upper extremity supported;Feet supported Sitting balance-Leahy Scale: Fair                                     ADL either performed or assessed with clinical judgement   ADL Overall ADL's : Needs assistance/impaired Eating/Feeding: Set up;Supervision/ safety       Upper Body Bathing: Moderate assistance   Lower Body Bathing: Maximal assistance   Upper Body Dressing : Moderate assistance   Lower Body Dressing: Maximal assistance                 General ADL Comments: pt agreeable to eob with increased HR with transfer. pt SOB and requesting to return to supine     Vision Baseline Vision/History: 1 Wears glasses       Perception     Praxis      Pertinent Vitals/Pain Pain Assessment: No/denies pain     Hand Dominance Right   Extremity/Trunk Assessment Upper Extremity Assessment Upper Extremity Assessment: Generalized weakness   Lower Extremity Assessment Lower Extremity Assessment: Defer to PT evaluation;RLE deficits/detail;LLE deficits/detail RLE Deficits / Details: pitting edema at foot 4+ and calf 3+ pt able to complete ankle pumps RLE Sensation: decreased light touch LLE Deficits / Details: edema at ankle less than R LE and edema in the calf LLE  Sensation: decreased light touch   Cervical / Trunk Assessment Cervical / Trunk Assessment: Normal   Communication Communication Communication: No difficulties   Cognition Arousal/Alertness: Awake/alert Behavior During Therapy: WFL for tasks assessed/performed Overall Cognitive Status: Impaired/Different from baseline Area of Impairment: Orientation;Memory;Awareness                 Orientation Level: Disoriented to;Time   Memory: Decreased  short-term memory     Awareness: Intellectual   General Comments: pt reports have i been here 3 days? Pt giving details that match the chart but time frames are slightly off based on documentation. pt needs guided step by step to help engage and manage anxiety about movement     General Comments  4L O2, HR 122 educated on purse lip breathing    Exercises Exercises: Other exercises Other Exercises Other Exercises: ankle pumps   Shoulder Instructions      Home Living Family/patient expects to be discharged to:: Private residence Living Arrangements: Spouse/significant other Available Help at Discharge: Family;Available 24 hours/day Type of Home: House Home Access: Stairs to enter [employer] of Steps: 1   Home Layout: Two level;Able to live on main level with bedroom/bathroom     Bathroom Shower/Tub: Occupational psychologist: Handicapped height     Home Equipment: [place] in;Rollator (4 wheels);Grab bars - tub/shower;Grab bars - toilet   Additional Comments: reports hiring some CNA help and getting a bath 2 times per week. pt reports HHPT starting wed 12/28 prior to admission. pt reports two weeks without PT due to address mix up and laying in bed in PJs for two weeks due to feeling poorly. pt reports his wife doesnt get up til 10 am and so he gets up and waits for her to have breakfast when she can help him      Prior Functioning/Environment Prior Level of Function : Needs assist             Mobility Comments: reports no DME from bedroom to sun room and bathroom. pt reports SOB and fatigue with transfers last two weeks. pt reports previously no difficulty ADLs Comments: pt requires CNA help for adls ( bathing dressing) pt reports wearing PJs due to decreased mobility and SOB        OT Problem List: Cardiopulmonary status limiting activity;Decreased activity tolerance;Decreased cognition;Decreased knowledge of precautions;Decreased  safety awareness;Decreased strength;Increased edema      OT Treatment/Interventions: Self-care/ADL training;Therapeutic activities;Therapeutic exercise;Energy conservation;DME and/or AE instruction;Cognitive remediation/compensation;Balance training;Patient/family education    OT Goals(Current goals can be found in the care plan section) Acute Rehab OT Goals Patient Stated Goal: to get stronger to d/c back home OT Goal Formulation: With patient Time For Goal Achievement: 08/29/21 Potential to Achieve Goals: Fair  OT Frequency: Min 2X/week   Barriers to D/C: Decreased caregiver support  pt reports delays in care due to wife ability to (A) - "i have to wait for her to get up" and bathing dependent on the CNA assistance two times a week       Co-evaluation              AM-PAC OT "6 Clicks" Daily Activity     Outcome Measure Help from another person eating meals?: A Lot Help from another person taking care of personal grooming?: A Lot Help from another person toileting, which includes using toliet, bedpan, or urinal?: A Lot Help from another person bathing (including washing, rinsing, drying)?: A Lot Help from another  person to put on and taking off regular upper body clothing?: A Lot Help from another person to put on and taking off regular lower body clothing?: Total 6 Click Score: 11   End of Session Equipment Utilized During Treatment: Oxygen Nurse Communication: Mobility status;Precautions  Activity Tolerance: Patient limited by fatigue Patient left: in bed;with call bell/phone within reach;with bed alarm set  OT Visit Diagnosis: Muscle weakness (generalized) (M62.81)                Time: 4739-5844 OT Time Calculation (min): 22 min Charges:  OT General Charges $OT Visit: 1 Visit OT Evaluation $OT Eval Moderate Complexity: 1 Mod   [place], OTR/L  Acute Rehabilitation Services Pager: [phone] Office: [phone] .   Jeri Modena 08/15/2021, 2:19 PM

## 2021-08-15 NOTE — Progress Notes (Signed)
Progress Note   Subjective   Remains SOB.  No CP  Inpatient Medications    Scheduled Meds:  apixaban  5 mg Oral BID   arformoterol  15 mcg Nebulization BID   budesonide (PULMICORT) nebulizer solution  0.25 mg Nebulization BID   furosemide  40 mg Intravenous BID   guaiFENesin  1,200 mg Oral BID   ipratropium  0.5 mg Nebulization Q6H   levalbuterol  1.25 mg Nebulization Q6H   multivitamin with minerals  1 tablet Oral Daily   polyethylene glycol  17 g Oral q morning   pravastatin  40 mg Oral QPM   sodium chloride flush  3 mL Intravenous Q12H   tamsulosin  0.4 mg Oral Daily   thiamine  100 mg Oral Daily   umeclidinium bromide  1 puff Inhalation Daily   vitamin B-12  1,000 mcg Oral Daily   Continuous Infusions:  sodium chloride     ceFEPime (MAXIPIME) IV 2 g (08/15/21 0507)   diltiazem (CARDIZEM) infusion 5 mg/hr (08/14/21 1125)   vancomycin Stopped (08/15/21 0131)   PRN Meds: sodium chloride, acetaminophen **OR** acetaminophen, hydrALAZINE, levalbuterol, melatonin, senna-docusate, sodium chloride flush   Vital Signs    Vitals:   08/14/21 2316 08/15/21 0339 08/15/21 0745 08/15/21 0752  BP: (!) 100/59 105/64 125/69   Pulse: 77 88 (!) 105   Resp: 19 16 20    Temp: 98 F (36.7 C) 97.8 F (36.6 C) 97.7 F (36.5 C)   TempSrc: Oral Oral Oral   SpO2: 100% 100% 96% 100%  Weight:      Height:        Intake/Output Summary (Last 24 hours) at 08/15/2021 0240 Last data filed at 08/15/2021 0850 Gross per 24 hour  Intake 400.4 ml  Output 1200 ml  Net -799.6 ml   Filed Weights   08/14/21 0910  Weight: 67 kg    Telemetry    Atrial tachycardia vs atrial flutter with intermittent sinus with ectopy - Personally Reviewed  Physical Exam   GEN- The patient is ill appearing, alert and oriented x 3 today.   Head- normocephalic, atraumatic Eyes-  Sclera clear, conjunctiva pink Ears- hearing intact Oropharynx- clear Neck- supple, Lungs- very poor airflow with expiratory  wheezing,  + bibasilar rales Heart- Regular rate and rhythm  with ectopy GI- soft  Extremities- no clubbing, cyanosis, +2 edema  MS- diffuse atrophy Skin- + venous stasis changes Psych- euthymic mood, full affect Neuro- strength and sensation are intact   Labs    Chemistry Recent Labs  Lab 08/14/21 0915 08/15/21 0306  NA 134* 134*  K 4.3 3.9  CL 97* 100  CO2 28 26  GLUCOSE 131* 145*  BUN 15 18  CREATININE 0.59* 0.46*  CALCIUM 8.4* 8.1*  PROT  --  5.1*  ALBUMIN  --  2.1*  AST  --  12*  ALT  --  13  ALKPHOS  --  45  BILITOT  --  0.6  GFRNONAA >60 >60  ANIONGAP 9 8     Hematology Recent Labs  Lab 08/14/21 0915 08/15/21 0306  WBC 19.3* 14.5*  RBC 3.98* 3.51*  HGB 11.6* 9.9*  HCT 36.8* 31.4*  MCV 92.5 89.5  MCH 29.1 28.2  MCHC 31.5 31.5  RDW 13.9 13.9  PLT 295 211     Patient ID  [name] is a 86 y.o. male with a hx of CAD who is being seen 08/14/2021 for the evaluation of new atrial fibrillation with RVR  at the request of Dr. Sloan Leiter.  Assessment & Plan    1.  Afib/ atrial tachycardia/ atypical atrial flutter Likely exacerbated by underlying respiratory failure/ pneumonia. Currently on eliquis Continue IV diltiazem for now Consider switching to PO as respiratory status improves Avoid beta blockers  2. Acute diastolic dysfunction Currently receiving IV lasix Continue diuresis Will screen for Alleviate HF trial.  3. Recent PTE On eliquis  4. Pneumonia/ COPE Per primary team  5. CAD No ischemic symptoms  Thompson Grayer MD, [facility] 08/15/2021 9:37 AM

## 2021-08-15 NOTE — Progress Notes (Signed)
PT Cancellation Note  Patient Details Name: [name] MRN: [id number] DOB: 1934/11/19   Cancelled Treatment:    Reason Eval/Treat Not Completed: Other (comment) Upon first attempt, pt politely asked PT to return later as he has been frequently interrupted this AM and would like to finish his breakfast. On second attempt pt off unit for imaging. Will continue to check back and evaluate as time/schedule allow.   [place] Carbo, PT, DPT   Acute Rehabilitation Department Pager #: [phone]   Sandra Cockayne 08/15/2021, 11:15 AM

## 2021-08-15 NOTE — Progress Notes (Addendum)
PROGRESS NOTE  [name] BDZ:[id number] DOB: 1935/07/15 DOA: 08/14/2021 PCP: Gayland Curry, DO   LOS: 1 day   Brief Narrative / Interim history: Is an 86 year old male with history of COPD with chronic hypoxic respiratory failure on home oxygen, CAD, HTN, HLD, BPH, recently hospitalized and discharged on 12/15 after treated for pneumonia / COPD exacerbation / PE who comes back to the hospital with shortness of breath as well as elevated heart rate.  He was discharged home on anticoagulation, steroid taper and antibiotics, was doing well up until the day prior to admission when he was started having shortness of breath, put on his pulse ox and so his heart rate was in the 150s.  He called EMS and was brought to the hospital.  On admission he was requiring 10 L of oxygen.  Chest x-ray showed worsening pneumonia, he was started on broad-spectrum antibiotics and admitted to the hospital.  He was also found to be in A. fib with RVR for which she was initiated on Cardizem infusion cardiology was consulted.  Subjective / 24h Interval events: Complains of persistent shortness of breath but overall feels better this morning than he was last night.  He denies any chest pain, denies any palpitations.  Assessment & Plan: Principal Problem Acute on chronic hypoxic respiratory failure, hemoptysis-due to HCAP.  He was placed on vancomycin and cefepime, continue, monitor cultures.  There was concern for cavitation on the chest x-ray, obtain a CT scan of the chest without contrast.  He also reported blood-tinged sputum has been going on ever since his discharge couple weeks ago but getting worse in the last few days  Active Problems A. fib with RVR-likely provoked by infectious process.  Already on Eliquis, recent echo earlier this month with preserved EF.  Appreciate cardiology follow-up  Lower extremity edema-worsened in the past several days, likely combination from hypoalbuminemia /third spacing and  also decompensated diastolic heart failure due to A. fib with RVR.  Continue furosemide  Acute on chronic diastolic CHF-continue IV Lasix  Recent PE-continue Eliquis  COPD-complains of increased wheezing this morning, add steroids  Essential hypertension-on IV Cardizem  Hyperlipidemia-continue statin  BPH-continue Flomax  Goals of care-during his prior hospital stay he was DNR.  Admitting MD discussed with the patient and he wished to remain full code at admission.  Readdressed CODE STATUS this morning, patient would want to be DNR in case of cardiac arrest, no CPR or chest compressions, but do intubate in case of respiratory failure alone.  He will be changed to partial code   Scheduled Meds:  apixaban  5 mg Oral BID   arformoterol  15 mcg Nebulization BID   budesonide (PULMICORT) nebulizer solution  0.25 mg Nebulization BID   furosemide  40 mg Intravenous BID   guaiFENesin  1,200 mg Oral BID   ipratropium  0.5 mg Nebulization Q6H   levalbuterol  1.25 mg Nebulization Q6H   multivitamin with minerals  1 tablet Oral Daily   polyethylene glycol  17 g Oral q morning   pravastatin  40 mg Oral QPM   sodium chloride flush  3 mL Intravenous Q12H   tamsulosin  0.4 mg Oral Daily   thiamine  100 mg Oral Daily   umeclidinium bromide  1 puff Inhalation Daily   vitamin B-12  1,000 mcg Oral Daily   Continuous Infusions:  sodium chloride     ceFEPime (MAXIPIME) IV 2 g (08/15/21 0507)   diltiazem (CARDIZEM) infusion 5 mg/hr (08/14/21  1125)   vancomycin Stopped (08/15/21 0131)   PRN Meds:.sodium chloride, acetaminophen **OR** acetaminophen, hydrALAZINE, levalbuterol, melatonin, senna-docusate, sodium chloride flush  Diet Orders (From admission, onward)     Start     Ordered   08/15/21 0731  Diet regular Room service appropriate? Yes; Fluid consistency: Thin  Diet effective now       Question Answer Comment  Room service appropriate? Yes   Fluid consistency: Thin      08/15/21 0730             DVT prophylaxis:  apixaban (ELIQUIS) tablet 5 mg     Code Status: Partial Code  Family Communication: No family at bedside  Status is: Inpatient  Remains inpatient appropriate because: Persistent symptoms, Cardizem infusion  Level of care: Progressive  Consultants:  Cardiology   Procedures:  None  Microbiology  Blood cultures, urine culture, sputum culture-pending  Antimicrobials: Vanc / cefepime 12/30 >>   Objective: Vitals:   08/14/21 2316 08/15/21 0339 08/15/21 0745 08/15/21 0752  BP: (!) 100/59 105/64 125/69   Pulse: 77 88 (!) 105   Resp: 19 16 20    Temp: 98 F (36.7 C) 97.8 F (36.6 C) 97.7 F (36.5 C)   TempSrc: Oral Oral Oral   SpO2: 100% 100% 96% 100%  Weight:      Height:        Intake/Output Summary (Last 24 hours) at 08/15/2021 1013 Last data filed at 08/15/2021 0850 Gross per 24 hour  Intake 400.4 ml  Output 1200 ml  Net -799.6 ml   Filed Weights   08/14/21 0910  Weight: 67 kg    Examination:  Constitutional: NAD Eyes: no scleral icterus ENMT: Mucous membranes are moist.  Neck: normal, supple Respiratory: End expiratory wheezing, coarse breath sounds.  Normal respiratory effort Cardiovascular: Irregular, 1-2+ bilateral lower extremity edema up to mid shins Abdomen: non distended, no tenderness. Bowel sounds positive.  Musculoskeletal: no clubbing / cyanosis.  Skin: no rashes Neurologic: CN 2-12 grossly intact. Strength 5/5 in all 4.  Psychiatric: Normal judgment and insight. Alert and oriented x 3. Normal mood.    Data Reviewed: I have independently reviewed following labs and imaging studies   CBC: Recent Labs  Lab 08/14/21 0915 08/15/21 0306  WBC 19.3* 14.5*  NEUTROABS 17.4*  --   HGB 11.6* 9.9*  HCT 36.8* 31.4*  MCV 92.5 89.5  PLT 295 269   Basic Metabolic Panel: Recent Labs  Lab 08/14/21 0915 08/15/21 0306  NA 134* 134*  K 4.3 3.9  CL 97* 100  CO2 28 26  GLUCOSE 131* 145*  BUN 15 18   CREATININE 0.59* 0.46*  CALCIUM 8.4* 8.1*   Liver Function Tests: Recent Labs  Lab 08/15/21 0306  AST 12*  ALT 13  ALKPHOS 45  BILITOT 0.6  PROT 5.1*  ALBUMIN 2.1*   Coagulation Profile: Recent Labs  Lab 08/14/21 1114  INR 1.2   HbA1C: No results for input(s): HGBA1C in the last 72 hours. CBG: No results for input(s): GLUCAP in the last 168 hours.  Recent Results (from the past 240 hour(s))  Resp Panel by RT-PCR (Flu A&B, Covid) Nasopharyngeal Swab     Status: None   Collection Time: 08/14/21  8:59 AM   Specimen: Nasopharyngeal Swab; Nasopharyngeal(NP) swabs in vial transport medium  Result Value Ref Range Status   SARS Coronavirus 2 by RT PCR NEGATIVE NEGATIVE Final    Comment: (NOTE) SARS-CoV-2 target nucleic acids are NOT DETECTED.  The SARS-CoV-2 RNA  is generally detectable in upper respiratory specimens during the acute phase of infection. The lowest concentration of SARS-CoV-2 viral copies this assay can detect is 138 copies/mL. A negative result does not preclude SARS-Cov-2 infection and should not be used as the sole basis for treatment or other patient management decisions. A negative result may occur with  improper specimen collection/handling, submission of specimen other than nasopharyngeal swab, presence of viral mutation(s) within the areas targeted by this assay, and inadequate number of viral copies(<138 copies/mL). A negative result must be combined with clinical observations, patient history, and epidemiological information. The expected result is Negative.  Fact Sheet for Patients:  EntrepreneurPulse.com.au  Fact Sheet for Healthcare Providers:  IncredibleEmployment.be  This test is no t yet approved or cleared by the [place] FDA and  has been authorized for detection and/or diagnosis of SARS-CoV-2 by FDA under an Emergency Use Authorization (EUA). This EUA will remain  in effect (meaning this test  can be used) for the duration of the COVID-19 declaration under Section 564(b)(1) of the Act, 21 U.S.C.section 360bbb-3(b)(1), unless the authorization is terminated  or revoked sooner.       Influenza A by PCR NEGATIVE NEGATIVE Final   Influenza B by PCR NEGATIVE NEGATIVE Final    Comment: (NOTE) The Xpert Xpress SARS-CoV-2/FLU/RSV plus assay is intended as an aid in the diagnosis of influenza from Nasopharyngeal swab specimens and should not be used as a sole basis for treatment. Nasal washings and aspirates are unacceptable for Xpert Xpress SARS-CoV-2/FLU/RSV testing.  Fact Sheet for Patients: EntrepreneurPulse.com.au  Fact Sheet for Healthcare Providers: IncredibleEmployment.be  This test is not yet approved or cleared by the [place] FDA and has been authorized for detection and/or diagnosis of SARS-CoV-2 by FDA under an Emergency Use Authorization (EUA). This EUA will remain in effect (meaning this test can be used) for the duration of the COVID-19 declaration under Section 564(b)(1) of the Act, 21 U.S.C. section 360bbb-3(b)(1), unless the authorization is terminated or revoked.  Performed at [facility], [employer] [address], [place], [place] [address]   MRSA Next Gen by PCR, Nasal     Status: None   Collection Time: 08/14/21 11:43 AM   Specimen: Nasal Mucosa; Nasal Swab  Result Value Ref Range Status   MRSA by PCR Next Gen NOT DETECTED NOT DETECTED Final    Comment: (NOTE) The GeneXpert MRSA Assay (FDA approved for NASAL specimens only), is one component of a comprehensive MRSA colonization surveillance program. It is not intended to diagnose MRSA infection nor to guide or monitor treatment for MRSA infections. Test performance is not FDA approved in patients less than 77 years old. Performed at [facility], [place] [address] [place], [place] [address]      Radiology Studies: No results found.  Marzetta Board,  MD, PhD [facility]  Between 7 am - 7 pm I am available, please contact me via Amion (for emergencies) or Securechat (non urgent messages)  Between 7 pm - 7 am I am not available, please contact night coverage MD/APP via Amion

## 2021-08-15 NOTE — Consult Note (Signed)
NAME:  [name], MRN:  [id number], DOB:  [birth year], LOS: 1 ADMISSION DATE:  08/14/2021, CONSULTATION DATE:  08/15/2021 REFERRING MD:  Laverna Peace MD, CHIEF COMPLAINT:   Pneumothorax  History of Present Illness:  86 year old with very severe COPD (FEV1 23% in 2012), coronary artery disease, hyperlipidemia with recent admission, discharged on 12/5 with pneumonia, COPD exacerbation, PE.  Readmitted with worsening dyspnea with HAP, atrial fibrillation with RVR.  He had a CT scan today which showed a new pneumothorax with worsening right lower lobe consolidation and PCCM consulted for evaluation and help with management  Pertinent  Medical History    has a past medical history of Allergic rhinitis, cause unspecified, Arthritis, COPD (chronic obstructive pulmonary disease) ([place]), Dyspnea, Dysrhythmia, Emphysema of lung ([place]), Hypertension, Left inguinal hernia (02/07/2019), Low hemoglobin, Neuromuscular disorder ([place]), Neuropathy, Pneumonia, and Pulmonary nodule.   Significant Hospital Events: Including procedures, antibiotic start and stop dates in addition to other pertinent events   12/30 at night  Interim History / Subjective:   Comfortable, able to speak full sentences.  Has mild cough.  No sputum production  Objective   Blood pressure 126/81, pulse (!) 120, temperature 97.8 F (36.6 C), temperature source Oral, resp. rate 16, height 5\' 9"  (1.753 m), weight 67 kg, SpO2 100 %.        Intake/Output Summary (Last 24 hours) at 08/15/2021 1556 Last data filed at 08/15/2021 1500 Gross per 24 hour  Intake 627.6 ml  Output 1075 ml  Net -447.4 ml   Filed Weights   08/14/21 0910  Weight: 67 kg    Examination: Gen:      No acute distress HEENT:  EOMI, sclera anicteric Neck:     No masses; no thyromegaly Lungs:    Bilateral expiratory wheeze CV:         Regular rate and rhythm; no murmurs Abd:      + bowel sounds; soft, non-tender; no palpable masses, no distension Ext:     No edema; adequate peripheral perfusion Skin:      Warm and dry; no rash Neuro: Awake, oriented  CT chest reviewed with apical and basal pneumothorax with right effusion, progressive airspace disease in the right lower lobe.  Resolved Hospital Problem list     Assessment & Plan:  Recurrent pneumonia, hospital-acquired pneumonia Right pneumothorax in the setting of very severe COPD  I have reviewed the CT scan and discussed with Dr. Denna Haggard, Interventional radiology.  The size and location of the pneumothorax would make chest tube placement difficult even with IR guidance.  He is currently on 4 L which is not too far from baseline and does not seem to be an respiratory distress.  Would advise conservative management for now.  If there is worsening clinical status or pneumothorax on chest x-ray then we can revisit   Placed on high flow nasal cannula to help with pneumothorax reabsorption Continue broad antibiotic coverage IV steroids, bronchodilators Cough suppression with codeine cough syrup Follow-up chest x-ray tomorrow  Discussed in detail with patient.  Best Practice (right click and "Reselect all SmartList Selections" daily)   Per primary team  Labs   CBC: Recent Labs  Lab 08/14/21 0915 08/15/21 0306  WBC 19.3* 14.5*  NEUTROABS 17.4*  --   HGB 11.6* 9.9*  HCT 36.8* 31.4*  MCV 92.5 89.5  PLT 295 403    Basic Metabolic Panel: Recent Labs  Lab 08/14/21 0915 08/15/21 0306  NA 134* 134*  K 4.3 3.9  CL 97* 100  CO2 28 26  GLUCOSE 131* 145*  BUN 15 18  CREATININE 0.59* 0.46*  CALCIUM 8.4* 8.1*   GFR: Estimated Creatinine Clearance: 65.1 mL/min (A) (by C-G formula based on SCr of 0.46 mg/dL (L)). Recent Labs  Lab 08/14/21 0915 08/14/21 1114 08/15/21 0306  WBC 19.3*  --  14.5*  LATICACIDVEN  --  1.5  --     Liver Function Tests: Recent Labs  Lab 08/15/21 0306  AST 12*  ALT 13  ALKPHOS 45  BILITOT 0.6  PROT 5.1*  ALBUMIN 2.1*   No results for  input(s): LIPASE, AMYLASE in the last 168 hours. No results for input(s): AMMONIA in the last 168 hours.  ABG No results found for: PHART, PCO2ART, PO2ART, HCO3, TCO2, ACIDBASEDEF, O2SAT   Coagulation Profile: Recent Labs  Lab 08/14/21 1114  INR 1.2    Cardiac Enzymes: No results for input(s): CKTOTAL, CKMB, CKMBINDEX, TROPONINI in the last 168 hours.  HbA1C: No results found for: HGBA1C  CBG: No results for input(s): GLUCAP in the last 168 hours.  Review of Systems:   REVIEW OF SYSTEMS:   All negative; except for those that are bolded, which indicate positives.  Constitutional: weight loss, weight gain, night sweats, fevers, chills, fatigue, weakness.  HEENT: headaches, sore throat, sneezing, nasal congestion, post nasal drip, difficulty swallowing, tooth/dental problems, visual complaints, visual changes, ear aches. Neuro: difficulty with speech, weakness, numbness, ataxia. CV:  chest pain, orthopnea, PND, swelling in lower extremities, dizziness, palpitations, syncope.  Resp: cough, hemoptysis, dyspnea, wheezing. GI: heartburn, indigestion, abdominal pain, nausea, vomiting, diarrhea, constipation, change in bowel habits, loss of appetite, hematemesis, melena, hematochezia.  GU: dysuria, change in color of urine, urgency or frequency, flank pain, hematuria. MSK: joint pain or swelling, decreased range of motion. Psych: change in mood or affect, depression, anxiety, suicidal ideations, homicidal ideations. Skin: rash, itching, bruising.   Past Medical History:  He,  has a past medical history of Allergic rhinitis, cause unspecified, Arthritis, COPD (chronic obstructive pulmonary disease) ([place]), Dyspnea, Dysrhythmia, Emphysema of lung ([place]), Hypertension, Left inguinal hernia (02/07/2019), Low hemoglobin, Neuromuscular disorder ([place]), Neuropathy, Pneumonia, and Pulmonary nodule.   Surgical History:   Past Surgical History:  Procedure Laterality Date   APPENDECTOMY  1943    CARDIAC CATHETERIZATION  2003   performed at [facility], Dr. Fransico Him   CATARACT EXTRACTION  2008   Dr. Darleen Crocker   CATARACT EXTRACTION, BILATERAL     EYE SURGERY     INGUINAL HERNIA REPAIR Left 02/07/2019   Procedure: OPEN REPAIR LEFT INGUINAL HERNIA WITH MESH;  Surgeon: Fanny Skates, MD;  Location: [facility];  Service: General;  Laterality: Left;  SPINAL AND TAP BLOCK ANESTHESIA   ROTATOR CUFF REPAIR  2002   Dr. Kathryne Hitch   TONSILLECTOMY  1945   VASECTOMY       Social History:   reports that he quit smoking about 31 years ago. His smoking use included cigarettes. He has a 76.00 pack-year smoking history. He has never used smokeless tobacco. He reports current alcohol use of about 7.0 standard drinks per week. He reports that he does not use drugs.   Family History:  His family history includes COPD in his mother; Coronary artery disease in an other family member; Stroke in his father and mother.   Allergies Allergies  Allergen Reactions   Tolectin [Tolmetin Sodium] Other (See Comments)    BP low and passed out   Montelukast Sodium Other (See Comments)  Unknown reaction   Nsaids Other (See Comments)    MD told pt not to use   Augmentin [Amoxicillin-Pot Clavulanate] Nausea And Vomiting    Did it involve swelling of the face/tongue/throat, SOB, or low BP? No Did it involve sudden or severe rash/hives, skin peeling, or any reaction on the inside of your mouth or nose? No Did you need to seek medical attention at a hospital or doctor's office? No When did it last happen? Last year   If all above answers are "NO", may proceed with cephalosporin use.   Neosporin [Bacitracin-Polymyxin B] Rash     Home Medications  Prior to Admission medications   Medication Sig Start Date End Date Taking? Authorizing Provider  acetaminophen (TYLENOL) 650 MG CR tablet Take 1,300 mg by mouth See admin instructions. Take 2 tablets (1300 mg) by mouth every morning, may also take 1-2 tablets  [phone] mg) at 4p-5p if needed for pain   Yes [provider]  apixaban (ELIQUIS) 5 MG TABS tablet Take 1-2 tablets (5-10 mg total) by mouth 2 (two) times daily. Take 2 tablets (10mg ) by mouth 2 (two) time daily for 6 days with first dose 12/15 PM then on 12/21 PM reduce dose to 1 tablet (5mg ) by mouth 2 (two) time daily Patient taking differently: Take 5 mg by mouth 2 (two) times daily. Take 2 tablets (10mg ) by mouth 2 (two) time daily for 6 days with first dose 12/15 PM then on 12/21 PM reduce dose to 1 tablet (5mg ) by mouth 2 (two) time daily 07/30/21  Yes Donne Hazel, MD  BREZTRI AEROSPHERE 160-9-4.8 MCG/ACT AERO INHALE 2 PUFFS INTO THE LUNGS IN THE MORNING AND AT BEDTIME Patient taking differently: Inhale 2 puffs into the lungs in the morning and at bedtime. 09/12/20  Yes Young, Tarri Fuller D, MD  fluticasone (FLONASE) 50 MCG/ACT nasal spray Place 1 spray into both nostrils daily as needed for allergies or rhinitis.   Yes [provider]  guaiFENesin (MUCINEX) 600 MG 12 hr tablet Take 600 mg by mouth 2 (two) times daily as needed for cough or to loosen phlegm.   Yes [provider]  ipratropium-albuterol (DUONEB) 0.5-2.5 (3) MG/3ML SOLN Take 3 mLs by nebulization every 6 (six) hours as needed (for SOB AND wheezing every 6 hours AND PRN). Patient taking differently: Take 3 mLs by nebulization every 6 (six) hours as needed (wheezing/shortness of breath). 02/23/21  Yes Young, Tarri Fuller D, MD  ketoconazole (NIZORAL) 2 % shampoo Apply 1 application topically 3 (three) times a week. 05/01/20  Yes [provider]  levalbuterol (XOPENEX HFA) 45 MCG/ACT inhaler Inhale 2 puffs every 6 hours as needed- rescue Patient taking differently: Inhale 2 puffs into the lungs every 6 (six) hours as needed for wheezing or shortness of breath. 07/21/21  Yes Young, Tarri Fuller D, MD  losartan (COZAAR) 25 MG tablet Take 1 tablet (25 mg total) by mouth daily. Please keep upcoming appt in April  2022 with Dr. Burt Knack before anymore refills. Thank you Patient taking differently: Take 25 mg by mouth every morning. 08/25/20  Yes Reed, Tiffany L, DO  melatonin 5 MG TABS Take 5 mg by mouth at bedtime as needed (sleep).   Yes [provider]  Nutritional Supplements (ENSURE HIGH PROTEIN) LIQD Take 237 oz by mouth daily.   Yes [provider]  OXYGEN Inhale 3-6 L into the lungs continuous. 3 at rest, 4-5 with exertion, 6 during shower   Yes [provider]  Polyethyl  Glycol-Propyl Glycol (SYSTANE) 0.4-0.3 % SOLN Place 1 drop into both eyes 2 (two) times daily as needed (dry eyes).   Yes [provider]  polyethylene glycol powder (GLYCOLAX/MIRALAX) powder Take 17 g by mouth every morning.   Yes [provider]  pravastatin (PRAVACHOL) 40 MG tablet TAKE 1 TABLET(40 MG) BY MOUTH DAILY Patient taking differently: Take 40 mg by mouth every evening. 10/23/20  Yes Reed, Tiffany L, DO  tamsulosin (FLOMAX) 0.4 MG CAPS capsule Take two capsules by mouth once daily. Patient taking differently: 0.4 mg every evening. 01/14/21  Yes Wardell Honour, MD  vitamin B-12 (CYANOCOBALAMIN) 1000 MCG tablet Take 1 tablet (1,000 mcg total) by mouth daily. Patient taking differently: Take 1,000 mcg by mouth daily with lunch. 10/19/19  Yes Gayland Curry, DO     Critical care time: NA   Marshell Garfinkel MD [facility] See Amion for pager  If no response to pager , please call [phone] until 7pm After 7:00 pm call [place]  [phone] 08/15/2021, 5:00 PM

## 2021-08-16 ENCOUNTER — Inpatient Hospital Stay (HOSPITAL_COMMUNITY): Payer: Medicare Other

## 2021-08-16 DIAGNOSIS — J9601 Acute respiratory failure with hypoxia: Secondary | ICD-10-CM | POA: Diagnosis not present

## 2021-08-16 DIAGNOSIS — I480 Paroxysmal atrial fibrillation: Secondary | ICD-10-CM | POA: Diagnosis not present

## 2021-08-16 LAB — COMPREHENSIVE METABOLIC PANEL
ALT: 14 U/L (ref 0–44)
AST: 14 U/L — ABNORMAL LOW (ref 15–41)
Albumin: 1.9 g/dL — ABNORMAL LOW (ref 3.5–5.0)
Alkaline Phosphatase: 47 U/L (ref 38–126)
Anion gap: 6 (ref 5–15)
BUN: 21 mg/dL (ref 8–23)
CO2: 33 mmol/L — ABNORMAL HIGH (ref 22–32)
Calcium: 8 mg/dL — ABNORMAL LOW (ref 8.9–10.3)
Chloride: 96 mmol/L — ABNORMAL LOW (ref 98–111)
Creatinine, Ser: 0.52 mg/dL — ABNORMAL LOW (ref 0.61–1.24)
GFR, Estimated: >60 mL/min (ref >60)
Glucose, Bld: 170 mg/dL — ABNORMAL HIGH (ref 70–99)
Potassium: 3.9 mmol/L (ref 3.5–5.1)
Sodium: 135 mmol/L (ref 135–145)
Total Bilirubin: 0.6 mg/dL (ref 0.3–1.2)
Total Protein: 4.8 g/dL — ABNORMAL LOW (ref 6.5–8.1)

## 2021-08-16 LAB — CBC
HCT: 28.2 % — ABNORMAL LOW (ref 39.0–52.0)
Hemoglobin: 8.7 g/dL — ABNORMAL LOW (ref 13.0–17.0)
MCH: 27.6 pg (ref 26.0–34.0)
MCHC: 30.9 g/dL (ref 30.0–36.0)
MCV: 89.5 fL (ref 80.0–100.0)
Platelets: 248 10*3/uL (ref 150–400)
RBC: 3.15 MIL/uL — ABNORMAL LOW (ref 4.22–5.81)
RDW: 13.9 % (ref 11.5–15.5)
WBC: 14.8 10*3/uL — ABNORMAL HIGH (ref 4.0–10.5)
nRBC: 0 % (ref 0.0–0.2)

## 2021-08-16 LAB — MAGNESIUM: Magnesium: 2.1 mg/dL (ref 1.7–2.4)

## 2021-08-16 MED ORDER — DILTIAZEM HCL 60 MG PO TABS
60.0000 mg | ORAL_TABLET | Freq: Four times a day (QID) | ORAL | Status: AC
  Administered 2021-08-16 – 2021-08-17 (×5): 60 mg via ORAL
  Filled 2021-08-16 (×6): qty 1

## 2021-08-16 MED ORDER — METHYLPREDNISOLONE SODIUM SUCC 40 MG IJ SOLR
40.0000 mg | Freq: Two times a day (BID) | INTRAMUSCULAR | Status: DC
  Administered 2021-08-16 – 2021-08-19 (×6): 40 mg via INTRAVENOUS
  Filled 2021-08-16 (×6): qty 1

## 2021-08-16 NOTE — TOC Initial Note (Signed)
Transition of Care [facility]) - Initial/Assessment Note    Patient Details  Name: [name] MRN: [id number] Date of Birth: 04-29-1935  Transition of Care [facility]) CM/SW Contact:    Bary Castilla, LCSW Phone Number:[phone] 08/16/2021, 12:49 PM  Clinical Narrative:                  CSW met with pt and pt's son to discuss the recommendation for SNF. Pt was aware of the recommendation and has tentatively agreed to pursue SNF however wants to see what bed offers come back before making final decision. Pt explained that going home with other support may be an option as well. CSW explained the SNF process and answered questions. Pt explained that he wanted to make calls to [facility] and [facility] to see if he could go to either facility. CSW explained that the [facility] usually do not accept non resident however if he know someone that could be a possibility.CSW provided 2 copies of the medicare.gov rating listing to patient and his son.  CSW explained that due to the level of O2 that pt is currently on that the referrals will be sent out once the O2 level is lower. This will give pt a chance to follow up with his contacts as well.  TOC team will continue to assist with discharge planning needs.  Expected Discharge Plan: Skilled Nursing Facility Barriers to Discharge: Continued Medical Work up, SNF Pending bed offer   Patient Goals and CMS Choice   CMS Medicare.gov Compare Post Acute Care list provided to:: Patient    Expected Discharge Plan and Services Expected Discharge Plan: [place] arrangements for the past 2 months: Single Family Home                                      Prior Living Arrangements/Services Living arrangements for the past 2 months: Single Family Home Lives with:: Spouse Patient language and need for interpreter reviewed:: Yes                 Activities of Daily Living      Permission  Sought/Granted      Share Information with NAME: Izora Gala  Permission granted to share info w AGENCY: SNFs  Permission granted to share info w Relationship: Spouse  Permission granted to share info w Contact Information: [phone]  Emotional Assessment Appearance:: Appears stated age Attitude/Demeanor/Rapport: Engaged Affect (typically observed): Accepting, Adaptable Orientation: : Oriented to Self, Oriented to Place, Oriented to  Time, Oriented to Situation      Admission diagnosis:  Atrial fibrillation with RVR (HCC) [I48.91] HCAP (healthcare-associated pneumonia) [J18.9] Acute hypoxemic respiratory failure (HCC) [J96.01] Sepsis, due to unspecified organism, unspecified whether acute organ dysfunction present [facility]) [A41.9] Patient Active Problem List   Diagnosis Date Noted   Acute hypoxemic respiratory failure ([place]) 08/14/2021   Acute on chronic respiratory failure with hypoxia ([place]) 07/27/2021   Community acquired pneumonia    Acute pulmonary embolism ([place])    Benign prostatic hyperplasia with nocturia 09/20/2019   Closed fracture of first lumbar vertebra ([place]) 09/20/2019   Idiopathic progressive neuropathy 09/20/2019   Other fatigue 09/20/2019   Venous insufficiency of both lower extremities 09/20/2019   Anemia 07/25/2019   Left inguinal hernia 02/07/2019   Left leg pain 05/18/2018   Pneumonia 05/18/2018   Chronic respiratory  failure with hypoxia ([place]) 01/12/2015   COPD with acute exacerbation ([place]) 01/21/2013   Hyperlipidemia 12/01/2011   Hypertension 12/01/2011   CAD (coronary artery disease) 05/23/2011   Lung nodule 09/16/2008   Allergic rhinitis 07/24/2007   COPD mixed type ([place]) 07/24/2007   BURSITIS 07/24/2007   PCP:  Gayland Curry, DO Pharmacy:   [facility] [id number] - [address], [place] [address] Phone: [phone] Fax: [place], [address] [place] [address] Phone: [phone] Fax: [phone]     Social Determinants of Health (SDOH) Interventions    Readmission Risk Interventions No flowsheet data found.

## 2021-08-16 NOTE — Progress Notes (Signed)
Patient ID: [name], male   DOB: 05-03-1935, 87 y.o.   MRN: [id number]  PROGRESS NOTE    [name]  MHD:[id number] DOB: Sep 24, 1934 DOA: 08/14/2021 PCP: Gayland Curry, DO   Brief Narrative:  87 year old male with history of COPD with chronic hypoxic respiratory failure on home oxygen, CAD, HTN, HLD, BPH, recently hospitalized and discharged on 12/15/2 after being treated for pneumonia/COPD exacerbation/PE presented with worsening shortness of breath.  On presentation, he was found to have heart rates in the 150s and required 10 L of oxygen.  Chest x-ray showed worsening pneumonia.  He was started on broad-spectrum antibiotics and Cardizem drip.  He was also found to have right-sided pneumothorax.  Cardiology and PCCM were consulted.  Assessment & Plan:   Acute on chronic respiratory failure with hypoxia Possible healthcare associated pneumonia Hemoptysis Severe COPD with exacerbation Right-sided pneumothorax -COVID-19 and influenza testing negative.  CT of chest showed right-sided pneumothorax and large right pleural effusion with progressive airspace disease in the right lobe compatible with pneumonia.   -Currently on broad-spectrum antibiotics.  Blood cultures negative so far.  Pulmonary following.  Pneumothorax being managed conservatively: Currently on 12 L high flow nasal cannula oxygen.  Paroxysmal A. fib with RVR -Cardiology following: Cardizem drip is being switched to oral Cardizem today by cardiology.  Continue Eliquis -Recent echo earlier this month showed preserved EF  Acute on chronic diastolic CHF -Continue IV Lasix.  Strict input and output.  Daily weights.  Fluid restriction.  Cardiology following.  Recent PE Continue Eliquis  Essential hypertension -Continue Cardizem, Lasix  Hyperlipidemia -Continue statin  BPH -Continue Flomax  Goals of care -Patient was a DNR during last hospitalization.  During this hospitalization, he was admitted as full  code but subsequently [place] has been changed to partial code (would want intubation in case of respiratory failure) -Overall condition is guarded to poor.  Consult palliative care for goals of care discussion.  Generalized deconditioning  -PT/OT recommend SNF placement.   DVT prophylaxis: Eliquis Code Status: Partial  family Communication: None at bedside Disposition Plan: Status is: Inpatient  Remains inpatient appropriate because: Of severity of illness  Consultants: PCCM/cardiology  Procedures: None  Antimicrobials:  Anti-infectives (From admission, onward)    Start     Dose/Rate Route Frequency Ordered Stop   08/15/21 0200  vancomycin (VANCOREADY) IVPB 750 mg/150 mL        750 mg 150 mL/hr over 60 Minutes Intravenous Every 12 hours 08/14/21 1142     08/14/21 1800  ceFEPIme (MAXIPIME) 2 g in sodium chloride 0.9 % 100 mL IVPB        2 g 200 mL/hr over 30 Minutes Intravenous Every 8 hours 08/14/21 1243     08/14/21 1145  vancomycin (VANCOREADY) IVPB 1500 mg/300 mL        1,500 mg 150 mL/hr over 120 Minutes Intravenous  Once 08/14/21 1142 08/14/21 1538   08/14/21 1000  ceFEPIme (MAXIPIME) 2 g in sodium chloride 0.9 % 100 mL IVPB        2 g 200 mL/hr over 30 Minutes Intravenous  Once 08/14/21 0947 08/14/21 1043   08/14/21 1000  azithromycin (ZITHROMAX) 500 mg in sodium chloride 0.9 % 250 mL IVPB        500 mg 250 mL/hr over 60 Minutes Intravenous  Once 08/14/21 0947 08/14/21 1051        Subjective: Patient seen and examined at bedside.  Has not felt any better.  Still short  of breath with exertion.  Denies any chest pain, nausea or vomiting.  Feels weak. Objective: Vitals:   08/15/21 2329 08/16/21 0245 08/16/21 0257 08/16/21 0721  BP: 128/67  126/70 (!) 151/71  Pulse: 83 64 79 87  Resp: 16  19 15   Temp: 97.8 F (36.6 C)  97.8 F (36.6 C) 97.8 F (36.6 C)  TempSrc: Oral  Oral Oral  SpO2: 100% 99% 100% 100%  Weight:      Height:        Intake/Output  Summary (Last 24 hours) at 08/16/2021 1105 Last data filed at 08/16/2021 0854 Gross per 24 hour  Intake 848.22 ml  Output 1000 ml  Net -151.78 ml   Filed Weights   08/14/21 0910  Weight: 67 kg    Examination:  General exam: Appears calm and comfortable.  Currently on 12 L high flow nasal cannula oxygen.  Looks chronically ill and deconditioned Respiratory system: Bilateral decreased breath sounds at bases with scattered crackles Cardiovascular system: S1 & S2 heard, Rate controlled Gastrointestinal system: Abdomen is nondistended, soft and nontender. Normal bowel sounds heard. Extremities: No cyanosis, clubbing; bilateral lower extremity edema present Central nervous system: Alert and oriented. No focal neurological deficits. Moving extremities Skin: No rashes, lesions or ulcers Psychiatry: Judgement and insight appear normal. Mood & affect appropriate.     Data Reviewed: I have personally reviewed following labs and imaging studies  CBC: Recent Labs  Lab 08/14/21 0915 08/15/21 0306 08/16/21 0138  WBC 19.3* 14.5* 14.8*  NEUTROABS 17.4*  --   --   HGB 11.6* 9.9* 8.7*  HCT 36.8* 31.4* 28.2*  MCV 92.5 89.5 89.5  PLT 295 211 629   Basic Metabolic Panel: Recent Labs  Lab 08/14/21 0915 08/15/21 0306 08/16/21 0138  NA 134* 134* 135  K 4.3 3.9 3.9  CL 97* 100 96*  CO2 28 26 33*  GLUCOSE 131* 145* 170*  BUN 15 18 21   CREATININE 0.59* 0.46* 0.52*  CALCIUM 8.4* 8.1* 8.0*  MG  --   --  2.1   GFR: Estimated Creatinine Clearance: 65.1 mL/min (A) (by C-G formula based on SCr of 0.52 mg/dL (L)). Liver Function Tests: Recent Labs  Lab 08/15/21 0306 08/16/21 0138  AST 12* 14*  ALT 13 14  ALKPHOS 45 47  BILITOT 0.6 0.6  PROT 5.1* 4.8*  ALBUMIN 2.1* 1.9*   No results for input(s): LIPASE, AMYLASE in the last 168 hours. No results for input(s): AMMONIA in the last 168 hours. Coagulation Profile: Recent Labs  Lab 08/14/21 1114  INR 1.2   Cardiac Enzymes: No  results for input(s): CKTOTAL, CKMB, CKMBINDEX, TROPONINI in the last 168 hours. BNP (last 3 results) No results for input(s): PROBNP in the last 8760 hours. HbA1C: No results for input(s): HGBA1C in the last 72 hours. CBG: No results for input(s): GLUCAP in the last 168 hours. Lipid Profile: No results for input(s): CHOL, HDL, LDLCALC, TRIG, CHOLHDL, LDLDIRECT in the last 72 hours. Thyroid Function Tests: Recent Labs    08/15/21 0306  TSH 0.526   Anemia Panel: No results for input(s): VITAMINB12, FOLATE, FERRITIN, TIBC, IRON, RETICCTPCT in the last 72 hours. Sepsis Labs: Recent Labs  Lab 08/14/21 1114  LATICACIDVEN 1.5    Recent Results (from the past 240 hour(s))  Resp Panel by RT-PCR (Flu A&B, Covid) Nasopharyngeal Swab     Status: None   Collection Time: 08/14/21  8:59 AM   Specimen: Nasopharyngeal Swab; Nasopharyngeal(NP) swabs in vial transport medium  Result  Value Ref Range Status   SARS Coronavirus 2 by RT PCR NEGATIVE NEGATIVE Final    Comment: (NOTE) SARS-CoV-2 target nucleic acids are NOT DETECTED.  The SARS-CoV-2 RNA is generally detectable in upper respiratory specimens during the acute phase of infection. The lowest concentration of SARS-CoV-2 viral copies this assay can detect is 138 copies/mL. A negative result does not preclude SARS-Cov-2 infection and should not be used as the sole basis for treatment or other patient management decisions. A negative result may occur with  improper specimen collection/handling, submission of specimen other than nasopharyngeal swab, presence of viral mutation(s) within the areas targeted by this assay, and inadequate number of viral copies(<138 copies/mL). A negative result must be combined with clinical observations, patient history, and epidemiological information. The expected result is Negative.  Fact Sheet for Patients:  EntrepreneurPulse.com.au  Fact Sheet for Healthcare Providers:   IncredibleEmployment.be  This test is no t yet approved or cleared by the [place] FDA and  has been authorized for detection and/or diagnosis of SARS-CoV-2 by FDA under an Emergency Use Authorization (EUA). This EUA will remain  in effect (meaning this test can be used) for the duration of the COVID-19 declaration under Section 564(b)(1) of the Act, 21 U.S.C.section 360bbb-3(b)(1), unless the authorization is terminated  or revoked sooner.       Influenza A by PCR NEGATIVE NEGATIVE Final   Influenza B by PCR NEGATIVE NEGATIVE Final    Comment: (NOTE) The Xpert Xpress SARS-CoV-2/FLU/RSV plus assay is intended as an aid in the diagnosis of influenza from Nasopharyngeal swab specimens and should not be used as a sole basis for treatment. Nasal washings and aspirates are unacceptable for Xpert Xpress SARS-CoV-2/FLU/RSV testing.  Fact Sheet for Patients: EntrepreneurPulse.com.au  Fact Sheet for Healthcare Providers: IncredibleEmployment.be  This test is not yet approved or cleared by the [place] FDA and has been authorized for detection and/or diagnosis of SARS-CoV-2 by FDA under an Emergency Use Authorization (EUA). This EUA will remain in effect (meaning this test can be used) for the duration of the COVID-19 declaration under Section 564(b)(1) of the Act, 21 U.S.C. section 360bbb-3(b)(1), unless the authorization is terminated or revoked.  Performed at [facility], [place] [address] [place], [place] [address]   Blood Culture (routine x 2)     Status: None (Preliminary result)   Collection Time: 08/14/21 11:14 AM   Specimen: Right Antecubital; Blood  Result Value Ref Range Status   Specimen Description RIGHT ANTECUBITAL  Final   Special Requests   Final    BOTTLES DRAWN AEROBIC AND ANAEROBIC Blood Culture adequate volume   Culture   Final    NO GROWTH < 24 HOURS Performed at [facility],  [place] [address], [place], [place] [address]    Report Status PENDING  Incomplete  Urine Culture     Status: None   Collection Time: 08/14/21 11:18 AM   Specimen: In/Out Cath Urine  Result Value Ref Range Status   Specimen Description IN/OUT CATH URINE  Final   Special Requests NONE  Final   Culture   Final    NO GROWTH Performed at [facility], [place] [address] [place],  [address]    Report Status 08/15/2021 FINAL  Final  Blood Culture (routine x 2)     Status: None (Preliminary result)   Collection Time: 08/14/21 11:25 AM   Specimen: BLOOD LEFT ARM  Result Value Ref Range Status   Specimen Description BLOOD  LEFT ARM  Final   Special Requests   Final    BOTTLES DRAWN AEROBIC AND ANAEROBIC Blood Culture adequate volume   Culture   Final    NO GROWTH < 24 HOURS Performed at [facility], [address] [place], [place] [address]    Report Status PENDING  Incomplete  MRSA Next Gen by PCR, Nasal     Status: None   Collection Time: 08/14/21 11:43 AM   Specimen: Nasal Mucosa; Nasal Swab  Result Value Ref Range Status   MRSA by PCR Next Gen NOT DETECTED NOT DETECTED Final    Comment: (NOTE) The GeneXpert MRSA Assay (FDA approved for NASAL specimens only), is one component of a comprehensive MRSA colonization surveillance program. It is not intended to diagnose MRSA infection nor to guide or monitor treatment for MRSA infections. Test performance is not FDA approved in patients less than 51 years old. Performed at [facility], [place] [address], [place],  [address]          Radiology Studies: CT CHEST WO CONTRAST  Result Date: 08/15/2021 CLINICAL DATA:  Pneumonia. Patient returns after admission earlier this month. Abnormal chest x-ray. Progressive airspace disease. Marked hypoxia. Tachycardia. Shortness of breath. EXAM: CT CHEST WITHOUT CONTRAST TECHNIQUE: Multidetector CT imaging of the chest was performed following the standard protocol without IV  contrast. COMPARISON:  One-view chest x-ray 08/14/2021.  CTA chest 07/27/2021. FINDINGS: Cardiovascular: Heart size is normal. Coronary artery calcifications are noted. Atherosclerotic changes are present in the aortic arch and great vessel origins without aneurysm or definite stenosis. Pulmonary arteries are enlarged, stable right main pulmonary artery measures 29 mm. Mediastinum/Nodes: No enlarged mediastinal or axillary lymph nodes. Thyroid gland, trachea, and esophagus demonstrate no significant findings. Lungs/Pleura: A right-sided pneumothorax is present anteriorly and inferiorly. Prominent pleural effusion is present. Progressive extensive airspace consolidation is present in the right lobe. The left lung demonstrates extensive centrilobular emphysematous change without focal airspace disease. No significant left-sided effusion or pneumothorax is present. Upper Abdomen: Atherosclerotic changes extend into the abdominal aorta without aneurysm. Left upper pole renal cyst again noted. Visualized abdomen is otherwise unremarkable. Musculoskeletal: Remote compression fractures at T11 and L1 again noted. Vertebral body heights otherwise maintained. No focal osseous lesions are present. The ribs are unremarkable. IMPRESSION: 1. Right-sided pneumothorax and large right pleural effusion. 2. Progressive airspace disease in the right lobe compatible with pneumonia. 3. Stable enlargement of the pulmonary arteries compatible with pulmonary arterial hypertension. 4. Coronary artery disease. 5. Remote compression fractures at T11 and L1. 6. Aortic Atherosclerosis (ICD10-I70.0) and Emphysema (ICD10-J43.9). These results were called by telephone at the time of interpretation on 08/15/2021 at 3:11 pm to provider [facility] , who verbally acknowledged these results. Electronically Signed   By: San Morelle M.D.   On: 08/15/2021 15:15   DG CHEST PORT 1 VIEW  Result Date: 08/16/2021 CLINICAL DATA:  Follow-up  pneumothorax.  Shortness of breath. EXAM: PORTABLE CHEST 1 VIEW COMPARISON:  08/14/2021 and older studies.  CT, 08/15/2021. FINDINGS: Interval improvement in right lung aeration. Airspace opacities on the right have improved. There are persistent linear type opacities extend superior laterally and inferiorly from the right hilum, which has a fibrotic appearance. Right pleural effusion obscures the hemidiaphragm. No visualized pneumothorax. Left lung is hyperexpanded, but clear. No left pleural effusion or pneumothorax. IMPRESSION: 1. Interval improvement in right lung aeration with a decrease in diffuse airspace opacities. No new lung abnormalities. 2. No visualized pneumothorax. The pneumothorax  noted on the previous day's CT may be loculated at the anterior lung base and not resolved on the AP portable chest radiograph. 3. Small right pleural effusions similar to the previous day's CT. Electronically Signed   By: Lajean Manes M.D.   On: 08/16/2021 09:34        Scheduled Meds:  apixaban  5 mg Oral BID   arformoterol  15 mcg Nebulization BID   budesonide (PULMICORT) nebulizer solution  0.25 mg Nebulization BID   diltiazem  60 mg Oral Q6H   furosemide  40 mg Intravenous BID   guaiFENesin  1,200 mg Oral BID   ipratropium  0.5 mg Nebulization Q6H   levalbuterol  1.25 mg Nebulization Q6H   methylPREDNISolone (SOLU-MEDROL) injection  60 mg Intravenous Q12H   multivitamin with minerals  1 tablet Oral Daily   polyethylene glycol  17 g Oral q morning   pravastatin  40 mg Oral QPM   sodium chloride flush  3 mL Intravenous Q12H   tamsulosin  0.4 mg Oral Daily   thiamine  100 mg Oral Daily   umeclidinium bromide  1 puff Inhalation Daily   vitamin B-12  1,000 mcg Oral Daily   Continuous Infusions:  sodium chloride     ceFEPime (MAXIPIME) IV 2 g (08/16/21 0622)   vancomycin 750 mg (08/16/21 0255)          Aline August, MD [facility] 08/16/2021, 11:05 AM

## 2021-08-16 NOTE — Progress Notes (Signed)
Progress Note   Subjective   Breathing is "about the same".  He is diuresing  Inpatient Medications    Scheduled Meds:  apixaban  5 mg Oral BID   arformoterol  15 mcg Nebulization BID   budesonide (PULMICORT) nebulizer solution  0.25 mg Nebulization BID   furosemide  40 mg Intravenous BID   guaiFENesin  1,200 mg Oral BID   ipratropium  0.5 mg Nebulization Q6H   levalbuterol  1.25 mg Nebulization Q6H   methylPREDNISolone (SOLU-MEDROL) injection  60 mg Intravenous Q12H   multivitamin with minerals  1 tablet Oral Daily   polyethylene glycol  17 g Oral q morning   pravastatin  40 mg Oral QPM   sodium chloride flush  3 mL Intravenous Q12H   tamsulosin  0.4 mg Oral Daily   thiamine  100 mg Oral Daily   umeclidinium bromide  1 puff Inhalation Daily   vitamin B-12  1,000 mcg Oral Daily   Continuous Infusions:  sodium chloride     ceFEPime (MAXIPIME) IV 2 g (08/16/21 0622)   diltiazem (CARDIZEM) infusion 5 mg/hr (08/16/21 0257)   vancomycin 750 mg (08/16/21 0255)   PRN Meds: sodium chloride, acetaminophen **OR** acetaminophen, hydrALAZINE, HYDROcodone bit-homatropine, levalbuterol, melatonin, senna-docusate, sodium chloride flush   Vital Signs    Vitals:   08/15/21 2329 08/16/21 0245 08/16/21 0257 08/16/21 0721  BP: 128/67  126/70 (!) 151/71  Pulse: 83 64 79 87  Resp: 16  19 15   Temp: 97.8 F (36.6 C)  97.8 F (36.6 C) 97.8 F (36.6 C)  TempSrc: Oral  Oral Oral  SpO2: 100% 99% 100% 100%  Weight:      Height:        Intake/Output Summary (Last 24 hours) at 08/16/2021 0945 Last data filed at 08/16/2021 0854 Gross per 24 hour  Intake 848.22 ml  Output 1000 ml  Net -151.78 ml   Filed Weights   08/14/21 0910  Weight: 67 kg    Telemetry    Sinus with frequent ectopy - Personally Reviewed  Physical Exam   GEN- The patient is frail and elderly appearing, alert and oriented x 3 today.   Head- normocephalic, atraumatic Eyes-  Sclera clear, conjunctiva pink Ears-  hearing intact Oropharynx- clear Neck- supple, Lungs-  very poor air movement,  + wheezing, prolong expiratory phase,  + rales Heart- Regular rate and rhythm with ectopy GI- soft  Extremities- no clubbing, cyanosis, edema is improving  MS- diffuse atrophy Skin- no rash or lesion Psych- euthymic mood, full affect Neuro- strength and sensation are intact   Labs    Chemistry Recent Labs  Lab 08/14/21 0915 08/15/21 0306 08/16/21 0138  NA 134* 134* 135  K 4.3 3.9 3.9  CL 97* 100 96*  CO2 28 26 33*  GLUCOSE 131* 145* 170*  BUN 15 18 21   CREATININE 0.59* 0.46* 0.52*  CALCIUM 8.4* 8.1* 8.0*  PROT  --  5.1* 4.8*  ALBUMIN  --  2.1* 1.9*  AST  --  12* 14*  ALT  --  13 14  ALKPHOS  --  45 47  BILITOT  --  0.6 0.6  GFRNONAA >60 >60 >60  ANIONGAP 9 8 6      Hematology Recent Labs  Lab 08/14/21 0915 08/15/21 0306 08/16/21 0138  WBC 19.3* 14.5* 14.8*  RBC 3.98* 3.51* 3.15*  HGB 11.6* 9.9* 8.7*  HCT 36.8* 31.4* 28.2*  MCV 92.5 89.5 89.5  MCH 29.1 28.2 27.6  MCHC 31.5 31.5  30.9  RDW 13.9 13.9 13.9  PLT 295 211 248     Patient ID  [name] is a 87 y.o. male with a hx of CAD who is being seen 08/14/2021 for the evaluation of new atrial fibrillation with RVR at the request of Dr. Sloan Leiter.  Assessment & Plan    1.  Atrial fibrillation/ atrial tachycardia/ atypical atrial flutter clinically improving Switch to oral diltiazem today  2. Acute on chronic diastolic dysfunction Continue IV diuresis  3. Pneumonia/ COPD Per primary team  4. Recent PTE On eliquis  5. CAD No ischemic symptoms  Thompson Grayer MD, [facility] 08/16/2021 9:45 AM

## 2021-08-16 NOTE — Plan of Care (Signed)
°  Problem: Activity: Goal: Ability to tolerate increased activity will improve Outcome: Progressing   Problem: Clinical Measurements: Goal: Ability to maintain a body temperature in the normal range will improve Outcome: Progressing   Problem: Respiratory: Goal: Ability to maintain adequate ventilation will improve Outcome: Progressing Goal: Ability to maintain a clear airway will improve Outcome: Progressing   Problem: Education: Goal: Knowledge of disease or condition will improve Outcome: Progressing Goal: Understanding of medication regimen will improve Outcome: Progressing Goal: Individualized Educational Video(s) Outcome: Progressing   Problem: Activity: Goal: Ability to tolerate increased activity will improve Outcome: Progressing   Problem: Cardiac: Goal: Ability to achieve and maintain adequate cardiopulmonary perfusion will improve Outcome: Progressing   Problem: Health Behavior/Discharge Planning: Goal: Ability to safely manage health-related needs after discharge will improve Outcome: Progressing   Problem: Education: Goal: Knowledge of General Education information will improve Description: Including pain rating scale, medication(s)/side effects and non-pharmacologic comfort measures Outcome: Progressing   Problem: Health Behavior/Discharge Planning: Goal: Ability to manage health-related needs will improve Outcome: Progressing   Problem: Clinical Measurements: Goal: Ability to maintain clinical measurements within normal limits will improve Outcome: Progressing Goal: Will remain free from infection Outcome: Progressing Goal: Diagnostic test results will improve Outcome: Progressing Goal: Respiratory complications will improve Outcome: Progressing Goal: Cardiovascular complication will be avoided Outcome: Progressing   Problem: Activity: Goal: Risk for activity intolerance will decrease Outcome: Progressing   Problem: Nutrition: Goal: Adequate  nutrition will be maintained Outcome: Progressing   Problem: Coping: Goal: Level of anxiety will decrease Outcome: Progressing   Problem: Elimination: Goal: Will not experience complications related to bowel motility Outcome: Progressing Goal: Will not experience complications related to urinary retention Outcome: Progressing   Problem: Pain Managment: Goal: General experience of comfort will improve Outcome: Progressing   Problem: Safety: Goal: Ability to remain free from injury will improve Outcome: Progressing   Problem: Skin Integrity: Goal: Risk for impaired skin integrity will decrease Outcome: Progressing

## 2021-08-16 NOTE — Progress Notes (Signed)
NAME:  [name], MRN:  [id number], DOB:  [birth year], LOS: 2 ADMISSION DATE:  08/14/2021, CONSULTATION DATE:  08/15/2021 REFERRING MD:  Laverna Peace MD, CHIEF COMPLAINT:   Pneumothorax  History of Present Illness:  87 year old with very severe COPD (FEV1 23% in 2012), coronary artery disease, hyperlipidemia with recent admission, discharged on 12/5 with pneumonia, COPD exacerbation, PE.  Readmitted with worsening dyspnea with HAP, atrial fibrillation with RVR.  He had a CT scan today which showed a new pneumothorax with worsening right lower lobe consolidation and PCCM consulted for evaluation and help with management  Pertinent  Medical History  Former Smoker  Emphysema COPD  Pulmonary Nodules HTN  Left Inguinal Hernia  Neuropathy   Significant Hospital Events: Including procedures, antibiotic start and stop dates in addition to other pertinent events   12/30 Admit  with SOB in setting of PNA, AFwRVR.  Pt comfortable, able to speak full sentences. Mild cough.   Interim History / Subjective:  Afebrile  Pt denies chest pain/pressure, shortness of breath. Reports he is anxious about his situation.   Objective   Blood pressure (!) 151/71, pulse 87, temperature 97.8 F (36.6 C), temperature source Oral, resp. rate 15, height 5\' 9"  (1.753 m), weight 67 kg, SpO2 100 %.        Intake/Output Summary (Last 24 hours) at 08/16/2021 1058 Last data filed at 08/16/2021 0854 Gross per 24 hour  Intake 848.22 ml  Output 1000 ml  Net -151.78 ml   Filed Weights   08/14/21 0910  Weight: 67 kg    Examination: General: chronically ill appearing adult male sitting up in bed in NAD, son at bedside  HEENT: MM pink/moist, [place] O2, anicteric  Neuro: AAOx4, speech clear, MAE CV: s1s2 irr irr, flutter on monitor, no m/r/g PULM: non-labored on [place], coarse rhonchi on right, diminished on left  GI: soft, bsx4 active  Extremities: warm/dry, no edema  Skin: no rashes or lesions  Resolved  Hospital Problem list     Assessment & Plan:   Recurrent pneumonia, hospital-acquired pneumonia Right pneumothorax in the setting of very severe COPD Acute on Chronic Diastolic Dysfunction  Recent PE on Eliquis  AF/Flutter  Radiographic review with significant emphysema, scarring, airspace disease and bulla on right.  In addition, he has a small anterior loculated PTX that is not readily visible on plain film.  Reviewed with IR on 12/31 for possible chest tube placement and it would be very difficult to safely place even with IR guidance due to size and location.  Further complicating his situation is he is anticoagulated due to recent PE. Thankfully, he is not in distress and not requiring significant O2.  His CXR on 1/1 shows some improvement in airspace disease, suspect this is volume given how quickly it improved (and not the contribution of PNA).   -continue high flow O2 in hopes of pneumothorax reabsorption  -abx per primary > cefepime, vancomycin  -cough suppression  -follow up CXR 1/2 am, will order later in am to all him to sleep  -reduce solumedrol to 40 mg BID -pulmicort + brovana  -continue incurse -consider repeat CT imaging in next week to 2 weeks to evaluate pneumothorax  -if acute decompensation, consider CXR possible CT placement  Frail Elderly  Discussed plan of care with patient and son extensively.  Spent approximately 35 minutes reviewing life transitions to include home PT, aide vs ALF options.  Discussed potential short term rehab efforts at SNF. Family at bedside, answered multiple  questions. He is followed by [facility] for Palliative Care (Dr. Mariea Clonts).   -PT efforts while inpatient  -OOB daily   PCCM will continue to follow.  Best Practice (right click and "Reselect all SmartList Selections" daily)  Per primary team  Critical care time: NA   Noe Gens, MSN, APRN, NP-C, AGACNP-BC [facility] 08/16/2021, 10:58 AM   Please see  [email or web address] for pager details.   From 7A-7P if no response, please call [phone] After hours, please call ELink [phone]

## 2021-08-17 ENCOUNTER — Inpatient Hospital Stay (HOSPITAL_COMMUNITY): Payer: Medicare Other

## 2021-08-17 DIAGNOSIS — I4891 Unspecified atrial fibrillation: Secondary | ICD-10-CM | POA: Diagnosis not present

## 2021-08-17 DIAGNOSIS — J9601 Acute respiratory failure with hypoxia: Secondary | ICD-10-CM | POA: Diagnosis not present

## 2021-08-17 DIAGNOSIS — R0603 Acute respiratory distress: Secondary | ICD-10-CM

## 2021-08-17 DIAGNOSIS — Z7189 Other specified counseling: Secondary | ICD-10-CM

## 2021-08-17 DIAGNOSIS — J441 Chronic obstructive pulmonary disease with (acute) exacerbation: Secondary | ICD-10-CM

## 2021-08-17 DIAGNOSIS — Z515 Encounter for palliative care: Secondary | ICD-10-CM

## 2021-08-17 LAB — BASIC METABOLIC PANEL
Anion gap: 7 (ref 5–15)
BUN: 27 mg/dL — ABNORMAL HIGH (ref 8–23)
CO2: 31 mmol/L (ref 22–32)
Calcium: 8.2 mg/dL — ABNORMAL LOW (ref 8.9–10.3)
Chloride: 97 mmol/L — ABNORMAL LOW (ref 98–111)
Creatinine, Ser: 0.65 mg/dL (ref 0.61–1.24)
GFR, Estimated: >60 mL/min (ref >60)
Glucose, Bld: 165 mg/dL — ABNORMAL HIGH (ref 70–99)
Potassium: 3.7 mmol/L (ref 3.5–5.1)
Sodium: 135 mmol/L (ref 135–145)

## 2021-08-17 LAB — CBC WITH DIFFERENTIAL/PLATELET
Abs Immature Granulocytes: 0.1 10*3/uL — ABNORMAL HIGH (ref 0.00–0.07)
Basophils Absolute: 0 10*3/uL (ref 0.0–0.1)
Basophils Relative: 0 %
Eosinophils Absolute: 0 10*3/uL (ref 0.0–0.5)
Eosinophils Relative: 0 %
HCT: 29.9 % — ABNORMAL LOW (ref 39.0–52.0)
Hemoglobin: 9.4 g/dL — ABNORMAL LOW (ref 13.0–17.0)
Immature Granulocytes: 1 %
Lymphocytes Relative: 2 %
Lymphs Abs: 0.3 10*3/uL — ABNORMAL LOW (ref 0.7–4.0)
MCH: 27.9 pg (ref 26.0–34.0)
MCHC: 31.4 g/dL (ref 30.0–36.0)
MCV: 88.7 fL (ref 80.0–100.0)
Monocytes Absolute: 0.3 10*3/uL (ref 0.1–1.0)
Monocytes Relative: 2 %
Neutro Abs: 14.7 10*3/uL — ABNORMAL HIGH (ref 1.7–7.7)
Neutrophils Relative %: 95 %
Platelets: 250 10*3/uL (ref 150–400)
RBC: 3.37 MIL/uL — ABNORMAL LOW (ref 4.22–5.81)
RDW: 13.7 % (ref 11.5–15.5)
WBC: 15.4 10*3/uL — ABNORMAL HIGH (ref 4.0–10.5)
nRBC: 0 % (ref 0.0–0.2)

## 2021-08-17 LAB — MAGNESIUM: Magnesium: 2.1 mg/dL (ref 1.7–2.4)

## 2021-08-17 LAB — LEGIONELLA PNEUMOPHILA SEROGP 1 UR AG: L. pneumophila Serogp 1 Ur Ag: NEGATIVE

## 2021-08-17 MED ORDER — DILTIAZEM HCL ER COATED BEADS 180 MG PO CP24: 180.0000 mg | ORAL_CAPSULE | Freq: Every day | ORAL | Status: DC

## 2021-08-17 MED ORDER — LIDOCAINE HCL (PF) 1 % IJ SOLN
INTRAMUSCULAR | Status: AC
  Administered 2021-08-17: 5 mL
  Filled 2021-08-17: qty 10

## 2021-08-17 MED ORDER — FUROSEMIDE 40 MG PO TABS
40.0000 mg | ORAL_TABLET | Freq: Two times a day (BID) | ORAL | Status: DC
  Administered 2021-08-17 – 2021-08-19 (×4): 40 mg via ORAL
  Filled 2021-08-17 (×3): qty 1

## 2021-08-17 MED ORDER — LIDOCAINE HCL (PF) 1 % IJ SOLN
30.0000 mL | Freq: Once | INTRAMUSCULAR | Status: DC
Start: 2021-08-17 — End: 2021-08-17

## 2021-08-17 MED ORDER — LIDOCAINE HCL (PF) 1 % IJ SOLN: 5.0000 mL | Freq: Once | INTRAMUSCULAR | Status: AC

## 2021-08-17 MED ORDER — SODIUM CHLORIDE 0.9% FLUSH
10.0000 mL | Freq: Three times a day (TID) | INTRAVENOUS | Status: DC
  Administered 2021-08-17 – 2021-08-21 (×11): 10 mL

## 2021-08-17 NOTE — Progress Notes (Signed)
Progress Note  Patient Name: [name] Date of Encounter: 08/17/2021  [facility] Cardiologist: Sherren Mocha, MD   Subjective   Shortness of breath slightly improved. No chest pain. Pt s/p right chest tube placement this am.   Inpatient Medications    Scheduled Meds:  apixaban  5 mg Oral BID   arformoterol  15 mcg Nebulization BID   budesonide (PULMICORT) nebulizer solution  0.25 mg Nebulization BID   diltiazem  60 mg Oral Q6H   furosemide  40 mg Intravenous BID   guaiFENesin  1,200 mg Oral BID   ipratropium  0.5 mg Nebulization Q6H   levalbuterol  1.25 mg Nebulization Q6H   methylPREDNISolone (SOLU-MEDROL) injection  40 mg Intravenous Q12H   multivitamin with minerals  1 tablet Oral Daily   polyethylene glycol  17 g Oral q morning   pravastatin  40 mg Oral QPM   sodium chloride flush  10 mL Other Q8H   sodium chloride flush  3 mL Intravenous Q12H   tamsulosin  0.4 mg Oral Daily   thiamine  100 mg Oral Daily   umeclidinium bromide  1 puff Inhalation Daily   vitamin B-12  1,000 mcg Oral Daily   Continuous Infusions:  sodium chloride     ceFEPime (MAXIPIME) IV 2 g (08/17/21 0535)   vancomycin 750 mg (08/17/21 0217)   PRN Meds: sodium chloride, acetaminophen **OR** acetaminophen, hydrALAZINE, HYDROcodone bit-homatropine, levalbuterol, melatonin, senna-docusate, sodium chloride flush   Vital Signs    Vitals:   08/17/21 0601 08/17/21 0722 08/17/21 0800 08/17/21 0906  BP:  (!) 122/56 118/67 (!) 110/59  Pulse:  70 89 82  Resp:  18 (!) 23 (!) 22  Temp:  (!) 97.5 F (36.4 C)    TempSrc:  Oral    SpO2:  96% 94% 94%  Weight: 57.8 kg     Height:        Intake/Output Summary (Last 24 hours) at 08/17/2021 1025 Last data filed at 08/17/2021 0600 Gross per 24 hour  Intake --  Output 1500 ml  Net -1500 ml   Last 3 Weights 08/17/2021 08/14/2021 07/30/2021  Weight (lbs) 127 lb 6.8 oz 147 lb 11.3 oz 149 lb 1.6 oz  Weight (kg) 57.8 kg 67 kg 67.631 kg       Telemetry    Atrial fibrillation, heart rate 80's, occasional PVC's - Personally Reviewed   Physical Exam  Alert, oriented, elderly male in NAD GEN: No acute distress.   Neck: No JVD Cardiac: irregularly irregular, no murmurs, rubs, or gallops.  Respiratory: Diminished breath sounds in the bases bilaterally. GI: Soft, nontender, non-distended  MS: trace bilateral pedal edema; No deformity. Neuro:  Nonfocal  Psych: Normal affect   Labs    High Sensitivity Troponin:   Recent Labs  Lab 07/27/21 1213 07/27/21 1416  TROPONINIHS 7 8     Chemistry Recent Labs  Lab 08/15/21 0306 08/16/21 0138 08/17/21 0106  NA 134* 135 135  K 3.9 3.9 3.7  CL 100 96* 97*  CO2 26 33* 31  GLUCOSE 145* 170* 165*  BUN 18 21 27*  CREATININE 0.46* 0.52* 0.65  CALCIUM 8.1* 8.0* 8.2*  MG  --  2.1 2.1  PROT 5.1* 4.8*  --   ALBUMIN 2.1* 1.9*  --   AST 12* 14*  --   ALT 13 14  --   ALKPHOS 45 47  --   BILITOT 0.6 0.6  --   GFRNONAA >60 >60 >60  ANIONGAP  8 6 7     Lipids No results for input(s): CHOL, TRIG, HDL, LABVLDL, LDLCALC, CHOLHDL in the last 168 hours.  Hematology Recent Labs  Lab 08/15/21 0306 08/16/21 0138 08/17/21 0106  WBC 14.5* 14.8* 15.4*  RBC 3.51* 3.15* 3.37*  HGB 9.9* 8.7* 9.4*  HCT 31.4* 28.2* 29.9*  MCV 89.5 89.5 88.7  MCH 28.2 27.6 27.9  MCHC 31.5 30.9 31.4  RDW 13.9 13.9 13.7  PLT 211 248 250   Thyroid  Recent Labs  Lab 08/15/21 0306  TSH 0.526    BNP Recent Labs  Lab 08/14/21 0915  BNP 229.0*    DDimer No results for input(s): DDIMER in the last 168 hours.   Radiology    CT CHEST WO CONTRAST  Result Date: 08/15/2021 CLINICAL DATA:  Pneumonia. Patient returns after admission earlier this month. Abnormal chest x-ray. Progressive airspace disease. Marked hypoxia. Tachycardia. Shortness of breath. EXAM: CT CHEST WITHOUT CONTRAST TECHNIQUE: Multidetector CT imaging of the chest was performed following the standard protocol without IV contrast.  COMPARISON:  One-view chest x-ray 08/14/2021.  CTA chest 07/27/2021. FINDINGS: Cardiovascular: Heart size is normal. Coronary artery calcifications are noted. Atherosclerotic changes are present in the aortic arch and great vessel origins without aneurysm or definite stenosis. Pulmonary arteries are enlarged, stable right main pulmonary artery measures 29 mm. Mediastinum/Nodes: No enlarged mediastinal or axillary lymph nodes. Thyroid gland, trachea, and esophagus demonstrate no significant findings. Lungs/Pleura: A right-sided pneumothorax is present anteriorly and inferiorly. Prominent pleural effusion is present. Progressive extensive airspace consolidation is present in the right lobe. The left lung demonstrates extensive centrilobular emphysematous change without focal airspace disease. No significant left-sided effusion or pneumothorax is present. Upper Abdomen: Atherosclerotic changes extend into the abdominal aorta without aneurysm. Left upper pole renal cyst again noted. Visualized abdomen is otherwise unremarkable. Musculoskeletal: Remote compression fractures at T11 and L1 again noted. Vertebral body heights otherwise maintained. No focal osseous lesions are present. The ribs are unremarkable. IMPRESSION: 1. Right-sided pneumothorax and large right pleural effusion. 2. Progressive airspace disease in the right lobe compatible with pneumonia. 3. Stable enlargement of the pulmonary arteries compatible with pulmonary arterial hypertension. 4. Coronary artery disease. 5. Remote compression fractures at T11 and L1. 6. Aortic Atherosclerosis (ICD10-I70.0) and Emphysema (ICD10-J43.9). These results were called by telephone at the time of interpretation on 08/15/2021 at 3:11 pm to provider [facility] , who verbally acknowledged these results. Electronically Signed   By: San Morelle M.D.   On: 08/15/2021 15:15   DG CHEST PORT 1 VIEW  Result Date: 08/17/2021 CLINICAL DATA:  Chest tube placement.  EXAM: PORTABLE CHEST 1 VIEW COMPARISON:  Chest x-ray earlier, same date. FINDINGS: Interval placement of a pigtail type pleural drainage catheter on the right side. Largely resolved pneumothorax. Tiny residual pneumothorax noted at the right lung base. Stable severe chronic underlying changes involving the right lung. The left lung remains relatively clear. IMPRESSION: Interval placement of a pigtail type pleural drainage catheter on the right side with near complete resolution of the pneumothorax. Tiny residual pneumothorax at the right lung base. Electronically Signed   By: Marijo Sanes M.D.   On: 08/17/2021 09:33   DG CHEST PORT 1 VIEW  Result Date: 08/17/2021 CLINICAL DATA:  Follow-up pneumothorax.  Shortness of breath. EXAM: PORTABLE CHEST 1 VIEW COMPARISON:  08/16/2021 FINDINGS: Stable cardiomediastinal contours. Interval recurrence of right lower pneumothorax. Now moderate to large measuring 6.2 x 9.7 cm. There is associated atelectasis of the right lower lobe and right  middle lobe. Masslike architectural distortion, pleural thickening and volume loss within the right upper lobe appears unchanged from previous exam. Left lung is clear. IMPRESSION: 1. Interval recurrence right lower pneumothorax. Now moderate to large. 2. Atelectasis of the right lower lobe and right middle lobe. 3. Stable masslike architectural distortion and volume loss within the right upper lobe. Electronically Signed   By: Kerby Moors M.D.   On: 08/17/2021 07:53   DG CHEST PORT 1 VIEW  Result Date: 08/16/2021 CLINICAL DATA:  Follow-up pneumothorax.  Shortness of breath. EXAM: PORTABLE CHEST 1 VIEW COMPARISON:  08/14/2021 and older studies.  CT, 08/15/2021. FINDINGS: Interval improvement in right lung aeration. Airspace opacities on the right have improved. There are persistent linear type opacities extend superior laterally and inferiorly from the right hilum, which has a fibrotic appearance. Right pleural effusion obscures the  hemidiaphragm. No visualized pneumothorax. Left lung is hyperexpanded, but clear. No left pleural effusion or pneumothorax. IMPRESSION: 1. Interval improvement in right lung aeration with a decrease in diffuse airspace opacities. No new lung abnormalities. 2. No visualized pneumothorax. The pneumothorax noted on the previous day's CT may be loculated at the anterior lung base and not resolved on the AP portable chest radiograph. 3. Small right pleural effusions similar to the previous day's CT. Electronically Signed   By: Lajean Manes M.D.   On: 08/16/2021 09:34    Cardiac Studies   Echo:  1. Left ventricular ejection fraction, by estimation, is 60 to 65%. The  left ventricle has normal function. The left ventricle has no regional  wall motion abnormalities. Left ventricular diastolic function could not  be evaluated.   2. Right ventricular systolic function is normal. The right ventricular  size is moderately enlarged. There is mildly elevated pulmonary artery  systolic pressure.   3. Right atrial size was mildly dilated.   4. The mitral valve is grossly normal. No evidence of mitral valve  regurgitation.   5. Tricuspid valve regurgitation is mild to moderate.   6. The aortic valve is tricuspid. Aortic valve regurgitation is not  visualized. No aortic stenosis is present.   7. The inferior vena cava is normal in size with greater than 50%  respiratory variability, suggesting right atrial pressure of 3 mmHg.   Comparison(s): No significant change from prior study. Prior images  reviewed side by side.   Patient Profile     87 y.o. male with a hx of CAD who is being seen 08/14/2021 for the evaluation of new atrial fibrillation with RVR at the request of Dr. Sloan Leiter.  Assessment & Plan    Atrial fibrillation with RVR, new onset: has tolerated transition from IV to oral diltiazem. Will now consolidate to long-acting diltiazem. Continue apixaban. Discussed plan with patient and son who is  at bedside. Reviewed natural hx of AF and treatment plan of rate-control/anticoagulation.  Acute on chronic diastolic HF in the setting of advanced lung disease. Volume status improved. Seems ready to transition to oral furosemide.   Dispo: will follow, palliative care consult this am.     For questions or updates, please contact [place] Please consult [email or web address] for contact info under        Signed, Sherren Mocha, MD  08/17/2021, 10:25 AM

## 2021-08-17 NOTE — Progress Notes (Signed)
Occupational Therapy Treatment Patient Details Name: [name] MRN: [id number] DOB: 08/21/1934 Today's Date: 08/17/2021   History of present illness The pt is an 87 yo male presenting 12/30 with SOB and tachycardia. Upon work up, pt with increased size of R pleural effusion and chest x-ray concerning for pneumonia. Cardiology consulted for new onset afib in setting of COPD exacerbation. PMH includes: COPD on 3L at rest and 4-6L O2 with exertion, CAD, HTN, HLD, and BPH.   OT comments  Pt progressing to eob with decreased (A) compared to Saturday 1/31 evaluation. Pt guarded over chest tube and noted to have some mild output with upright against gravity posture. Pt anxious about position change with new chest tube. Recommendations remain SNf at this time.   Noted palliative consult book at bedside   Recommendations for follow up therapy are one component of a multi-disciplinary discharge planning process, led by the attending physician.  Recommendations may be updated based on patient status, additional functional criteria and insurance authorization.    Follow Up Recommendations  Skilled nursing-short term rehab (<3 hours/day)    Assistance Recommended at Discharge Intermittent Supervision/Assistance  Equipment Recommendations  BSC/3in1;Wheelchair (measurements OT);Wheelchair cushion (measurements OT);Other (comment)    Recommendations for Other Services      Precautions / Restrictions Precautions Precautions: Fall Precaution Comments: new chest tube/ watch O2       Mobility Bed Mobility Overal bed mobility: Needs Assistance Bed Mobility: Supine to Sit;Sit to Supine     Supine to sit: Min guard Sit to supine: Min guard   General bed mobility comments: pt sitting for several minutes and begins to complain of pain at eob. Pt with R UE use with self feeding and R chest tube placement noted    Transfers                   General transfer comment: declines. pt  becomes almost liable at the decline. pt reports "ill do whateveer you want tomorrow"     Balance Overall balance assessment: Needs assistance Sitting-balance support: Bilateral upper extremity supported Sitting balance-Leahy Scale: Fair                                     ADL either performed or assessed with clinical judgement   ADL Overall ADL's : Needs assistance/impaired Eating/Feeding: Set up Eating/Feeding Details (indicate cue type and reason): eating spaghetti with fork during session needs (A) to cut very firm chicken Grooming: Wash/dry hands;Set up Grooming Details (indicate cue type and reason): wiping mouth                               General ADL Comments: progressed to eob sitting and sustained for a few bits. pt unable to sustain    Extremity/Trunk Assessment Upper Extremity Assessment Upper Extremity Assessment: Generalized weakness   Lower Extremity Assessment Lower Extremity Assessment: Generalized weakness        Vision       Perception     Praxis      Cognition Arousal/Alertness: Awake/alert Behavior During Therapy: Anxious Overall Cognitive Status: Impaired/Different from baseline                       Memory: Decreased recall of precautions;Decreased short-term memory     Awareness: Intellectual   General Comments: pt reports today was  a good day because son was present when doctor came to see him. pt liable for a moment saying "i can't " pt informed that he has a choice and we are just trying to help him recover. Pt thanking therapist for helping him shortly after          Exercises     Shoulder Instructions       General Comments VSS, chest tube,    Pertinent Vitals/ Pain       Pain Assessment: Faces Faces Pain Scale: Hurts little more Pain Location: side R Pain Descriptors / Indicators: Grimacing Pain Intervention(s): Monitored during session;Repositioned;Patient requesting pain meds-RN  notified  Home Living                                          Prior Functioning/Environment              Frequency  Min 2X/week        Progress Toward Goals  OT Goals(current goals can now be found in the care plan section)  Progress towards OT goals: Progressing toward goals  Acute Rehab OT Goals Patient Stated Goal: to do it tomorrow OT Goal Formulation: With patient Time For Goal Achievement: 08/29/21 Potential to Achieve Goals: Fair ADL Goals Pt Will Perform Grooming: with min assist;sitting Pt Will Perform Upper Body Bathing: with min assist;sitting Pt Will Transfer to Toilet: with min assist;stand pivot transfer;bedside commode  Plan Discharge plan remains appropriate    Co-evaluation                 AM-PAC OT "6 Clicks" Daily Activity     Outcome Measure   Help from another person eating meals?: A Lot Help from another person taking care of personal grooming?: A Lot Help from another person toileting, which includes using toliet, bedpan, or urinal?: A Lot Help from another person bathing (including washing, rinsing, drying)?: A Lot Help from another person to put on and taking off regular upper body clothing?: A Lot Help from another person to put on and taking off regular lower body clothing?: Total 6 Click Score: 11    End of Session Equipment Utilized During Treatment: Oxygen  OT Visit Diagnosis: Muscle weakness (generalized) (M62.81)   Activity Tolerance Patient limited by pain   Patient Left in bed;with call bell/phone within reach;with bed alarm set;with nursing/sitter in room (RN at bedside and pt requested medication but after his meal)   Nurse Communication Mobility status;Precautions        Time: 0962-8366 OT Time Calculation (min): 20 min  Charges: OT General Charges $OT Visit: 1 Visit OT Treatments $Self Care/Home Management : 8-22 mins   Brynn, OTR/L  Acute Rehabilitation Services Pager:  [phone] Office: [phone] .   Jeri Modena 08/17/2021, 5:24 PM

## 2021-08-17 NOTE — TOC Progression Note (Signed)
Transition of Care [facility]) - Progression Note    Patient Details  Name: [name] MRN: [id number] Date of Birth: 10-Feb-1935  Transition of Care [facility]) CM/SW [place], [place] Phone Number: 08/17/2021, 6:27 PM  Clinical Narrative:    Pt has been faxed out, DC pending snf placement.   Expected Discharge Plan: [place] Barriers to Discharge: Continued Medical Work up, SNF Pending bed offer  Expected Discharge Plan and Services Expected Discharge Plan: [place] arrangements for the past 2 months: Single Family Home                                       Social Determinants of Health (SDOH) Interventions    Readmission Risk Interventions No flowsheet data found.

## 2021-08-17 NOTE — Progress Notes (Signed)
Speech Language Pathology Treatment: Dysphagia  Patient Details Name: [name] MRN: [id number] DOB: 1935-04-13 Today's Date: 08/17/2021 Time: 1215-1228 SLP Time Calculation (min) (ACUTE ONLY): 13 min  Assessment / Plan / Recommendation Clinical Impression  Pt was seen for f/u dysphagia tx with initial plan to initiate RMST held as CT was just inserted this morning. Pt was instead observed during lunch meal, consuming regular solids and thin liquids with a single throat clear but no overt coughing noted. SLP provided education on swallowing strategies recommended from previous MBS. Pt and son both acknowledged their understanding, and pt reports that he's been trying to implement them during meals. They both deny any increase in coughing during PO intake. Will continue to follow with plan to f/u for initiation of RMST as well.    HPI HPI: Pt is an 87 yo male adm to hospital with recurrent R upper lobe and lower lobe PNA and SOB.  Pt was admitted to [facility] two weeks ago for same issue.  Large airspace opacity is noted in right upper and lower lobes concerning for pneumonia and cavitation, small sliding hiatal hernia. MBSS two weeks ago revealed grossly normal pharyngeal swallow function with single instance of audible aspiration that occured from residue after nectar thick liquids remaining in pyriform sinus during a trial of puree. At that time, discussion with pt/family (son) including risks/benefits of oral diet was completed and regular/thin was decided on.      SLP Plan  Continue with current plan of care      Recommendations for follow up therapy are one component of a multi-disciplinary discharge planning process, led by the attending physician.  Recommendations may be updated based on patient status, additional functional criteria and insurance authorization.    Recommendations  Diet recommendations: Regular;Thin liquid Liquids provided via: Cup;Straw Medication  Administration: Whole meds with puree Supervision: Patient able to self feed;Intermittent supervision to cue for compensatory strategies Compensations: Slow rate;Small sips/bites;Follow solids with liquid Postural Changes and/or Swallow Maneuvers: Seated upright 90 degrees                Oral Care Recommendations: Oral care BID Follow Up Recommendations: Outpatient SLP Assistance recommended at discharge: PRN SLP Visit Diagnosis: Dysphagia, oropharyngeal phase (R13.12) Plan: Continue with current plan of care           [name]., M.A. [place] Acute Rehabilitation Services Pager [phone] Office [phone]  08/17/2021, 12:57 PM

## 2021-08-17 NOTE — Progress Notes (Signed)
Physical Therapy Treatment Patient Details Name: [name] MRN: [id number] DOB: 1935/05/19 Today's Date: 08/17/2021   History of Present Illness The pt is an 87 yo male presenting 12/30 with SOB and tachycardia. Upon work up, pt with increased size of R pleural effusion and chest x-ray concerning for pneumonia. Cardiology consulted for new onset afib in setting of COPD exacerbation. PMH includes: COPD on 3L at rest and 4-6L O2 with exertion, CAD, HTN, HLD, and BPH.    PT Comments    The pt was agreeable to session initially, but continues to present with significant anxiety in regards to mobility progression or any OOB mobility. He was able to complete supine-sit transfers with minG, but became significantly restless and tearful with suggestion of progressing to recliner. Pt states idea of getting out of bed is overwhelming and feels like "it is all just too much too fast" Once returned to supine position, the pt was very appreciative, endorsed that he will do anything tomorrow. Will continue to benefit from skilled PT, likely to progress slowly and therefore continue to recommend SNF placement for continued rehab.    Recommendations for follow up therapy are one component of a multi-disciplinary discharge planning process, led by the attending physician.  Recommendations may be updated based on patient status, additional functional criteria and insurance authorization.  Follow Up Recommendations  Skilled nursing-short term rehab (<3 hours/day)     Assistance Recommended at Discharge Frequent or constant Supervision/Assistance  Equipment Recommendations  None recommended by PT    Recommendations for Other Services       Precautions / Restrictions Precautions Precautions: Fall Precaution Comments: new chest tube/ watch O2 Restrictions Weight Bearing Restrictions: No     Mobility  Bed Mobility Overal bed mobility: Needs Assistance Bed Mobility: Supine to Sit;Sit to Supine      Supine to sit: Min guard Sit to supine: Min guard   General bed mobility comments: pt sitting for several minutes and begins to complain of pain at eob. Pt with R UE use with self feeding and R chest tube placement noted    Transfers                   General transfer comment: declines. pt becomes almost liable at the decline. pt reports "ill do whateveer you want tomorrow"        Balance Overall balance assessment: Needs assistance Sitting-balance support: Bilateral upper extremity supported Sitting balance-Leahy Scale: Fair                                      Cognition Arousal/Alertness: Awake/alert Behavior During Therapy: Anxious Overall Cognitive Status: Impaired/Different from baseline                       Memory: Decreased recall of precautions;Decreased short-term memory     Awareness: Intellectual   General Comments: pt reports today was a good day because son was present when doctor came to see him. pt liable for a moment saying "i can't " pt informed that he has a choice and we are just trying to help him recover. Pt thanking therapist for helping him shortly after        Exercises      General Comments General comments (skin integrity, edema, etc.): VSS on 4L O2. R chest tube to wall suction      Pertinent Vitals/Pain Pain  Assessment: Faces Faces Pain Scale: Hurts little more Pain Location: side R Pain Descriptors / Indicators: Grimacing Pain Intervention(s): Monitored during session;Repositioned;Patient requesting pain meds-RN notified     PT Goals (current goals can now be found in the care plan section) Acute Rehab PT Goals Patient Stated Goal: to improve breathing PT Goal Formulation: With patient/family Time For Goal Achievement: 08/29/21 Potential to Achieve Goals: Fair Progress towards PT goals: Progressing toward goals    Frequency    Min 3X/week      PT Plan Current plan remains appropriate        AM-PAC PT "6 Clicks" Mobility   Outcome Measure  Help needed turning from your back to your side while in a flat bed without using bedrails?: A Little Help needed moving from lying on your back to sitting on the side of a flat bed without using bedrails?: A Little Help needed moving to and from a bed to a chair (including a wheelchair)?: A Lot Help needed standing up from a chair using your arms (e.g., wheelchair or bedside chair)?: A Lot Help needed to walk in hospital room?: Total Help needed climbing 3-5 steps with a railing? : Total 6 Click Score: 12    End of Session Equipment Utilized During Treatment: Oxygen Activity Tolerance: Other (comment) (pt self-limiting due to anxiety) Patient left: in bed;with call bell/phone within reach Nurse Communication: Mobility status PT Visit Diagnosis: Unsteadiness on feet (R26.81);Other abnormalities of gait and mobility (R26.89);Muscle weakness (generalized) (M62.81)     Time: 9597-4718 PT Time Calculation (min) (ACUTE ONLY): 23 min  Charges:  $Therapeutic Activity: 8-22 mins                     West Carbo, PT, DPT   Acute Rehabilitation Department Pager #: [phone]   Sandra Cockayne 08/17/2021, 5:53 PM

## 2021-08-17 NOTE — Consult Note (Signed)
Consultation Note Date: 08/17/2021   Patient Name: [name]  DOB: 04-30-1935  MRN: [id number]  Age / Sex: 87 y.o., male  PCP: Gayland Curry, DO Referring Physician: Aline August, MD  Reason for Consultation: Establishing goals of care and Psychosocial/spiritual support  HPI/Patient Profile: 87 y.o. male   admitted on 08/14/2021 with   with medical history significant of COPD with chronic hypoxemic respiratory failure on home O2, CAD, HTN, HLD, BPH-who was just discharged from this hospital on 12/15 after being treated for pneumonia/COPD exacerbation/pulmonary embolism-he was discharged home on anticoagulation, steroid taper and antibiotics.    Post discharge-he was doing well and gradually improving.    Unfortunately he started having shortness of breath, he noticed that when he checked his pulse ox-his heart rate was in the 140s-150s range.  Since he continued to have persistent tachycardia on subsequent checks-he presented to the emergency room for further evaluation and treatment.   Per patient-he has also noticed significant worsening of his chronic lower extremity edema over the past few days.  Today is day 3 of his hospitalization.  Today required chest tube placement for new right-sided pneumothorax.  Patient lives at home with his wife who has healthcare issues of her own.     Patient and family face treatment option decisions, advanced directive decisions and anticipatory care needs.  Clinical Assessment and Goals of Care:  This NP Wadie Lessen reviewed medical records, received report from team, assessed the patient and then meet at the patient's bedside  along with his son/David to discuss diagnosis, prognosis, GOC, EOL wishes disposition and options.  Values and goals of care important to patient and family were attempted to be elicited.   Concept of Palliative Care was introduced  as specialized medical care for people and their families living with serious illness.  If focuses on providing relief from the symptoms and stress of a serious illness.  The goal is to improve quality of life for both the patient and the family.  Education offered on his multiple comorbidities specific to his severe COPD and the limitations of medical interventions to prolong quality of life when the body fails to thrive.  Education offered on associated symptoms with severe COPD specific to dyspnea and  fatigue.  He is high risk for decompensation.  Created space and opportunity for patient  and family to explore thoughts and feelings regarding current medical situation.  Patient verbalizes his concerns regarding his increasing personal care needs and also those of his wife who live at home.[name]  He expresses that in the past he looked into the option of a tiered living facility, however they never secured a change in living situation. He lives at home with his wife who has significant health care issues of her own.  They do have some out-of-pocket help coming into the home several days a week.  Today's conversation is difficult for Mr. Mutschler.  He is tearful at times.  He tells me he has never really had a conversation exploring his  thoughts and feelings regarding his own mortality.   A  discussion was had today regarding advanced directives.  Concepts specific to code status, artifical feeding and hydration, continued IV antibiotics and rehospitalization was had.    The difference between a aggressive medical intervention path  and a palliative comfort care path for this patient at this time was had.     MOST form introduced.  Education offered on hospice benefit; philosophy and eligibility.   Questions and concerns addressed.  Patient  encouraged to call with questions or concerns.     PMT will continue to support holistically, I will follow-up with Mr. Longoria in the morning.          Patient has healthcare power of attorney document on file naming his wife as first agent and his son Shanon Brow as second agent.      SUMMARY OF RECOMMENDATIONS    Code Status/Advance Care Planning: Limited code Educated patient/family to consider DNR/DNI status understanding evidenced based poor outcomes in similar hospitalized patient, as the cause of arrest is likely associated with advanced chronic illness rather than an easily reversible acute cardio-pulmonary event.    Palliative Prophylaxis:  Aspiration, Bowel Regimen, Delirium Protocol, Frequent Pain Assessment, and Oral Care  Additional Recommendations (Limitations, Scope, Preferences): Full Scope Treatment Patient is hopeful for improvement, transition to skilled nursing facility for short-term rehab and eventually back home.  Patient and family anticipate the need of putting more nursing care in place once he is discharged home.  Psycho-social/Spiritual:  Desire for further Chaplaincy support:yes Additional Recommendations: Education on Hospice  Prognosis:  Unable to determine  Discharge Plan  Patient is hopeful for SNF for short-term rehab and eventual discharge home.   Ultimately he hopes for increased strength and independence.     Primary Diagnoses: Present on Admission:  Acute hypoxemic respiratory failure ([place])  Pneumonia  Hypertension  Hyperlipidemia  COPD mixed type (HCC)  Benign prostatic hyperplasia with nocturia  Acute pulmonary embolism ([place])  Acute on chronic respiratory failure with hypoxia ([place])   I have reviewed the medical record, interviewed the patient and family, and examined the patient. The following aspects are pertinent.  Past Medical History:  Diagnosis Date   Allergic rhinitis, cause unspecified    Arthritis    COPD (chronic obstructive pulmonary disease) (HCC)    Dyspnea    Dysrhythmia    "1 episode of tachycardia in 1980's, been on it ever since"   Emphysema of lung ([address])     Hypertension    Left inguinal hernia 02/07/2019   Low hemoglobin    low level   Neuromuscular disorder (HCC)    Neuropathy    feet   Pneumonia    History of, last Oct 2019   Pulmonary nodule    Social History   Socioeconomic History   Marital status: Married    Spouse name: Izora Gala   Number of children: 2   Years of education: Not on file   Highest education level: Bachelor's degree (e.g., BA, AB, BS)  Occupational History   Not on file  Tobacco Use   Smoking status: Former    Packs/day: 2.00    Years: 38.00    Pack years: 76.00    Types: Cigarettes    Quit date: 08/16/1990    Years since quitting: 31.0   Smokeless tobacco: Never  Vaping Use   Vaping Use: Never used  Substance and Sexual Activity   Alcohol use: Yes    Alcohol/week: 7.0 standard drinks  Types: 7 Shots of liquor per week    Comment: reports having a cocktail nightly with wife   Drug use: No   Sexual activity: Not Currently  Other Topics Concern   Not on file  Social History Narrative   Social History      Diet?       Do you drink/eat things with caffeine? Coffee 1 cup daily       Marital status?                      Married               What year were you married? 1962      Do you live in a house, apartment, assisted living, condo, trailer, etc.? house      Is it one or more stories? 2      How many persons live in your home?  2      Do you have any pets in your home? (please list)   No       Highest level of education completed?  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       Do you exercise?                    some                  Type & how often? Did pulmonary rehab      Advanced Directives      Do you have a living will? yes      Do you have a DNR form?                                  If not, do you want to discuss one?      Do you have signed POA/HPOA for forms? yes      Functional Status      Do you have difficulty  bathing or dressing yourself?      Do you have difficulty preparing food or eating?       Do you have difficulty managing your medications?      Do you have difficulty managing your finances?      Do you have difficulty affording your medications?      Social Determinants of Health   Financial Resource Strain: Not on file  Food Insecurity: No Food Insecurity   Worried About Charity fundraiser in the Last Year: Never true   Ran Out of Food in the Last Year: Never true  Transportation Needs: No Transportation Needs   Lack of Transportation (Medical): No   Lack of Transportation (Non-Medical): No  Physical Activity: Not on file  Stress: Not on file  Social Connections: Not on file   Family History  Problem Relation Age of Onset   COPD Mother    Stroke Mother    Stroke Father    Coronary artery disease Other        [facility]   Scheduled Meds:  apixaban  5 mg Oral BID   arformoterol  15 mcg Nebulization BID   budesonide (PULMICORT) nebulizer solution  0.25 mg Nebulization BID   diltiazem  60 mg Oral Q6H   furosemide  40 mg Intravenous BID   guaiFENesin  1,200 mg Oral BID   ipratropium  0.5 mg Nebulization Q6H   levalbuterol  1.25 mg Nebulization Q6H   methylPREDNISolone (SOLU-MEDROL) injection  40 mg Intravenous Q12H   multivitamin with minerals  1 tablet Oral Daily   polyethylene glycol  17 g Oral q morning   pravastatin  40 mg Oral QPM   sodium chloride flush  3 mL Intravenous Q12H   tamsulosin  0.4 mg Oral Daily   thiamine  100 mg Oral Daily   umeclidinium bromide  1 puff Inhalation Daily   vitamin B-12  1,000 mcg Oral Daily   Continuous Infusions:  sodium chloride     ceFEPime (MAXIPIME) IV 2 g (08/17/21 0535)   vancomycin 750 mg (08/17/21 0217)   PRN Meds:.sodium chloride, acetaminophen **OR** acetaminophen, hydrALAZINE, HYDROcodone bit-homatropine, levalbuterol, melatonin, senna-docusate, sodium chloride flush Medications Prior to Admission:  Prior to Admission  medications   Medication Sig Start Date End Date Taking? Authorizing Provider  acetaminophen (TYLENOL) 650 MG CR tablet Take 1,300 mg by mouth See admin instructions. Take 2 tablets (1300 mg) by mouth every morning, may also take 1-2 tablets ([phone] mg) at 4p-5p if needed for pain   Yes [provider]  apixaban (ELIQUIS) 5 MG TABS tablet Take 1-2 tablets (5-10 mg total) by mouth 2 (two) times daily. Take 2 tablets (10mg ) by mouth 2 (two) time daily for 6 days with first dose 12/15 PM then on 12/21 PM reduce dose to 1 tablet (5mg ) by mouth 2 (two) time daily Patient taking differently: Take 5 mg by mouth 2 (two) times daily. Take 2 tablets (10mg ) by mouth 2 (two) time daily for 6 days with first dose 12/15 PM then on 12/21 PM reduce dose to 1 tablet (5mg ) by mouth 2 (two) time daily 07/30/21  Yes Donne Hazel, MD  BREZTRI AEROSPHERE 160-9-4.8 MCG/ACT AERO INHALE 2 PUFFS INTO THE LUNGS IN THE MORNING AND AT BEDTIME Patient taking differently: Inhale 2 puffs into the lungs in the morning and at bedtime. 09/12/20  Yes Young, Tarri Fuller D, MD  fluticasone (FLONASE) 50 MCG/ACT nasal spray Place 1 spray into both nostrils daily as needed for allergies or rhinitis.   Yes [provider]  guaiFENesin (MUCINEX) 600 MG 12 hr tablet Take 600 mg by mouth 2 (two) times daily as needed for cough or to loosen phlegm.   Yes [provider]  ipratropium-albuterol (DUONEB) 0.5-2.5 (3) MG/3ML SOLN Take 3 mLs by nebulization every 6 (six) hours as needed (for SOB AND wheezing every 6 hours AND PRN). Patient taking differently: Take 3 mLs by nebulization every 6 (six) hours as needed (wheezing/shortness of breath). 02/23/21  Yes Young, Tarri Fuller D, MD  ketoconazole (NIZORAL) 2 % shampoo Apply 1 application topically 3 (three) times a week. 05/01/20  Yes [provider]  levalbuterol (XOPENEX HFA) 45 MCG/ACT inhaler Inhale 2 puffs every 6 hours as needed- rescue Patient taking differently:  Inhale 2 puffs into the lungs every 6 (six) hours as needed for wheezing or shortness of breath. 07/21/21  Yes Young, Tarri Fuller D, MD  losartan (COZAAR) 25 MG tablet Take 1 tablet (25 mg total) by mouth daily. Please keep upcoming appt in April 2022 with Dr. Burt Knack before anymore refills. Thank you Patient taking differently: Take 25 mg by mouth every morning. 08/25/20  Yes Reed, Tiffany L, DO  melatonin 5 MG TABS Take 5 mg by mouth at bedtime as needed (sleep).   Yes [provider]  Nutritional Supplements (ENSURE HIGH PROTEIN) LIQD Take 237 oz by mouth  daily.   Yes [provider]  OXYGEN Inhale 3-6 L into the lungs continuous. 3 at rest, 4-5 with exertion, 6 during shower   Yes [provider]  Polyethyl Glycol-Propyl Glycol (SYSTANE) 0.4-0.3 % SOLN Place 1 drop into both eyes 2 (two) times daily as needed (dry eyes).   Yes [provider]  polyethylene glycol powder (GLYCOLAX/MIRALAX) powder Take 17 g by mouth every morning.   Yes [provider]  pravastatin (PRAVACHOL) 40 MG tablet TAKE 1 TABLET(40 MG) BY MOUTH DAILY Patient taking differently: Take 40 mg by mouth every evening. 10/23/20  Yes Reed, Tiffany L, DO  tamsulosin (FLOMAX) 0.4 MG CAPS capsule Take two capsules by mouth once daily. Patient taking differently: 0.4 mg every evening. 01/14/21  Yes Wardell Honour, MD  vitamin B-12 (CYANOCOBALAMIN) 1000 MCG tablet Take 1 tablet (1,000 mcg total) by mouth daily. Patient taking differently: Take 1,000 mcg by mouth daily with lunch. 10/19/19  Yes Reed, Tiffany L, DO   Allergies  Allergen Reactions   Tolectin [Tolmetin Sodium] Other (See Comments)    BP low and passed out   Montelukast Sodium Other (See Comments)    Unknown reaction   Nsaids Other (See Comments)    MD told pt not to use   Augmentin [Amoxicillin-Pot Clavulanate] Nausea And Vomiting    Did it involve swelling of the face/tongue/throat, SOB, or low BP? No Did it involve sudden or  severe rash/hives, skin peeling, or any reaction on the inside of your mouth or nose? No Did you need to seek medical attention at a hospital or doctor's office? No When did it last happen? Last year   If all above answers are "NO", may proceed with cephalosporin use.   Neosporin [Bacitracin-Polymyxin B] Rash   Review of Systems  Constitutional:  Positive for fatigue.  Respiratory:  Positive for shortness of breath.   Neurological:  Positive for weakness.   Physical Exam Constitutional:      Appearance: He is cachectic. He is ill-appearing.     Interventions: Nasal cannula in place.  Cardiovascular:     Rate and Rhythm: Normal rate. Rhythm irregular.  Pulmonary:     Breath sounds: Examination of the right-upper field reveals decreased breath sounds. Examination of the right-middle field reveals decreased breath sounds. Examination of the right-lower field reveals decreased breath sounds. Decreased breath sounds present.  Skin:    General: Skin is warm and dry.  Neurological:     Mental Status: He is alert.    Vital Signs: BP (!) 101/58 (BP Location: Right Arm) Comment: Simultaneous filing. User may not have seen previous data. Comment (BP Location): Simultaneous filing. User may not have seen previous data.   Pulse 68 Comment: Simultaneous filing. User may not have seen previous data.   Temp 97.6 F (36.4 C) (Oral)    Resp 14 Comment: Simultaneous filing. User may not have seen previous data.   Ht 5\' 9"  (1.753 m)    Wt 57.8 kg    SpO2 96% Comment: Simultaneous filing. User may not have seen previous data.   BMI 18.82 kg/m  Pain Scale: 0-10 POSS *See Group Information*: S-Acceptable,Sleep, easy to arouse Pain Score: Asleep   SpO2: SpO2: 96 % (Simultaneous filing. User may not have seen previous data.) O2 Device:SpO2: 96 % (Simultaneous filing. User may not have seen previous data.) O2 Flow Rate: .O2 Flow Rate (L/min): 12 L/min  IO: Intake/output summary:  Intake/Output Summary  (Last 24 hours) at 08/17/2021 [id number]  Last data filed at 08/17/2021 0600 Gross per 24 hour  Intake 360 ml  Output 1900 ml  Net -1540 ml    LBM: Last BM Date: 08/13/21 Baseline Weight: Weight: 67 kg Most recent weight: Weight: 57.8 kg     Palliative Assessment/Data: 30% at best   Discussed with Dr Starla Link and Dr Burt Knack and [facility] team    Signed by: Wadie Lessen, NP   Please contact Palliative Medicine Team phone at [phone] for questions and concerns.  For individual provider: See Shea Evans

## 2021-08-17 NOTE — Procedures (Signed)
Insertion of Chest Tube Procedure Note  [name]  [id number]  [birth year]  Date:08/17/21  Time:9:13 AM    Provider Performing: Marshell Garfinkel   Procedure: Pleural Catheter Insertion w/ Imaging Guidance [id number])  Indication(s) Pneumothorax  Consent Risks of the procedure as well as the alternatives and risks of each were explained to the patient and/or caregiver.  Consent for the procedure was obtained and is signed in the bedside chart  Anesthesia Topical only with 1% lidocaine    Time Out Verified patient identification, verified procedure, site/side was marked, verified correct patient position, special equipment/implants available, medications/allergies/relevant history reviewed, required imaging and test results available.   Sterile Technique Maximal sterile technique including full sterile barrier drape, hand hygiene, sterile gown, sterile gloves, mask, hair covering, sterile ultrasound probe cover (if used).   Procedure Description Ultrasound used to identify appropriate pleural anatomy for placement and overlying skin marked. Area of placement cleaned and draped in sterile fashion.  A pigtail pleural catheter was placed into the right pleural space using Seldinger technique. Appropriate return of air was obtained.  The tube was connected to atrium and placed on -20 cm H2O wall suction.   Complications/Tolerance None; patient tolerated the procedure well. Chest X-ray is ordered to verify placement.   EBL Minimal  Specimen(s) none  Marshell Garfinkel MD [facility] See Amion for pager  If no response to pager , please call [phone] until 7pm After 7:00 pm call Elink  [phone] 08/17/2021, 9:14 AM

## 2021-08-17 NOTE — Plan of Care (Signed)
°  Problem: Activity: Goal: Ability to tolerate increased activity will improve Outcome: Progressing   Problem: Clinical Measurements: Goal: Ability to maintain a body temperature in the normal range will improve Outcome: Progressing   Problem: Respiratory: Goal: Ability to maintain adequate ventilation will improve Outcome: Progressing Goal: Ability to maintain a clear airway will improve Outcome: Progressing   Problem: Education: Goal: Knowledge of disease or condition will improve Outcome: Progressing Goal: Understanding of medication regimen will improve Outcome: Progressing Goal: Individualized Educational Video(s) Outcome: Progressing   Problem: Activity: Goal: Ability to tolerate increased activity will improve Outcome: Progressing   Problem: Cardiac: Goal: Ability to achieve and maintain adequate cardiopulmonary perfusion will improve Outcome: Progressing   Problem: Health Behavior/Discharge Planning: Goal: Ability to safely manage health-related needs after discharge will improve Outcome: Progressing   Problem: Education: Goal: Knowledge of General Education information will improve Description: Including pain rating scale, medication(s)/side effects and non-pharmacologic comfort measures Outcome: Progressing   Problem: Health Behavior/Discharge Planning: Goal: Ability to manage health-related needs will improve Outcome: Progressing   Problem: Clinical Measurements: Goal: Ability to maintain clinical measurements within normal limits will improve Outcome: Progressing Goal: Will remain free from infection Outcome: Progressing Goal: Diagnostic test results will improve Outcome: Progressing Goal: Respiratory complications will improve Outcome: Progressing Goal: Cardiovascular complication will be avoided Outcome: Progressing   Problem: Activity: Goal: Risk for activity intolerance will decrease Outcome: Progressing   Problem: Nutrition: Goal: Adequate  nutrition will be maintained Outcome: Progressing   Problem: Coping: Goal: Level of anxiety will decrease Outcome: Progressing   Problem: Elimination: Goal: Will not experience complications related to bowel motility Outcome: Progressing Goal: Will not experience complications related to urinary retention Outcome: Progressing   Problem: Pain Managment: Goal: General experience of comfort will improve Outcome: Progressing   Problem: Safety: Goal: Ability to remain free from injury will improve Outcome: Progressing   Problem: Skin Integrity: Goal: Risk for impaired skin integrity will decrease Outcome: Progressing

## 2021-08-17 NOTE — Progress Notes (Signed)
PT Cancellation Note  Patient Details Name: [name] MRN: [id number] DOB: 20-Jul-1935   Cancelled Treatment:    Reason Eval/Treat Not Completed: Patient at procedure or test/unavailable this morning due to having chest tube placed. Will continue to check back and attempt again later today.   West Carbo, PT, DPT   Acute Rehabilitation Department Pager #: [phone]   Sandra Cockayne 08/17/2021, 10:04 AM

## 2021-08-17 NOTE — Progress Notes (Signed)
Pharmacy Antibiotic Note  [name] is a 87 y.o. male admitted on 08/14/2021 with pneumonia.  Pharmacy has been consulted for Vanco, Cefepime dosing.  ID: ?cavitation on CXR. Possible HCAP with hemopytsis ,  Chest tube placed 1/2. - WBC 15.4 up today, Afebrile.   Antimicrobials this admission:  Vancomycin 12/30 >  Cefepime 12/30 >   Dose adjustments this admission:   Microbiology results:  12/30 - UCx > neg 12/30 - BCx x 2 > ngtd -COVID-19 and influenza testing negative  Plan: Vancomycin 750 mg q12hr (eAUC: 532) - Will order levels tomorrow. Cefepime 2g IV q8hr    Height: 5\' 9"  (175.3 cm) Weight: 57.8 kg (127 lb 6.8 oz) IBW/kg (Calculated) : 70.7  Temp (24hrs), Avg:97.7 F (36.5 C), Min:97.5 F (36.4 C), Max:98 F (36.7 C)  Recent Labs  Lab 08/14/21 0915 08/14/21 1114 08/15/21 0306 08/16/21 0138 08/17/21 0106  WBC 19.3*  --  14.5* 14.8* 15.4*  CREATININE 0.59*  --  0.46* 0.52* 0.65  LATICACIDVEN  --  1.5  --   --   --     Estimated Creatinine Clearance: 56.2 mL/min (by C-G formula based on SCr of 0.65 mg/dL).    Allergies  Allergen Reactions   Tolectin [Tolmetin Sodium] Other (See Comments)    BP low and passed out   Montelukast Sodium Other (See Comments)    Unknown reaction   Nsaids Other (See Comments)    MD told pt not to use   Augmentin [Amoxicillin-Pot Clavulanate] Nausea And Vomiting    Did it involve swelling of the face/tongue/throat, SOB, or low BP? No Did it involve sudden or severe rash/hives, skin peeling, or any reaction on the inside of your mouth or nose? No Did you need to seek medical attention at a hospital or doctor's office? No When did it last happen? Last year   If all above answers are "NO", may proceed with cephalosporin use.   Neosporin [Bacitracin-Polymyxin B] Rash    Kerrilyn Azbill S. Alford Highland, PharmD, BCPS Clinical Staff Pharmacist [email or web address]  Wayland Salinas 08/17/2021 10:44 AM

## 2021-08-17 NOTE — Progress Notes (Signed)
Patient ID: [name], male   DOB: [birth year], 87 y.o.   MRN: [id number]  PROGRESS NOTE    MAXAMUS COLAO  QZE:[id number] DOB: 1935/02/13 DOA: 08/14/2021 PCP: Gayland Curry, DO   Brief Narrative:  87 year old male with history of COPD with chronic hypoxic respiratory failure on home oxygen, CAD, HTN, HLD, BPH, recently hospitalized and discharged on 12/15/2 after being treated for pneumonia/COPD exacerbation/PE presented with worsening shortness of breath.  On presentation, he was found to have heart rates in the 150s and required 10 L of oxygen.  Chest x-ray showed worsening pneumonia.  He was started on broad-spectrum antibiotics and Cardizem drip.  He was also found to have right-sided pneumothorax.  Cardiology and PCCM were consulted.  Assessment & Plan:   Acute on chronic respiratory failure with hypoxia Possible healthcare associated pneumonia Hemoptysis Severe COPD with exacerbation Right-sided pneumothorax -COVID-19 and influenza testing negative.  CT of chest showed right-sided pneumothorax and large right pleural effusion with progressive airspace disease in the right lobe compatible with pneumonia.   -Currently on broad-spectrum antibiotics.  Blood cultures negative so far.  Pulmonary following.  Pneumothorax being managed conservatively: Currently still on 12 L high flow nasal cannula oxygen.  Chest x-ray this morning showing worsening pneumothorax.  I spoke to the radiologist regarding the chest x-ray findings.  I then spoke to Dr. Vaughan Browner on phone and relayed him the message. -Continue IV steroids along with nebs.  Paroxysmal A. fib with RVR -Cardiology following: Cardizem drip has been switched to oral Cardizem by cardiology on 08/16/2021.  Currently rate controlled.  Continue Eliquis -Recent echo earlier this month showed preserved EF  Acute on chronic diastolic CHF -Continue IV Lasix.  Strict input and output.  Daily weights.  Fluid restriction.  Cardiology  following.  Leukocytosis -Monitor  Recent PE -Continue Eliquis  Possible anemia of chronic disease -From chronic illnesses.  Hemoglobin stable.  Essential hypertension -Continue Cardizem, Lasix  Hyperlipidemia -Continue statin  BPH -Continue Flomax  Goals of care -Patient was a DNR during last hospitalization.  During this hospitalization, he was admitted as full code but subsequently [place] has been changed to partial code (would want intubation in case of respiratory failure) -Overall condition is guarded to poor.  Consult palliative care for goals of care discussion.  Generalized deconditioning  -PT/OT recommend SNF placement.   DVT prophylaxis: Eliquis Code Status: Partial  family Communication: None at bedside Disposition Plan: Status is: Inpatient  Remains inpatient appropriate because: Of severity of illness  Consultants: PCCM/cardiology/palliative care  Procedures: None  Antimicrobials:  Anti-infectives (From admission, onward)    Start     Dose/Rate Route Frequency Ordered Stop   08/15/21 0200  vancomycin (VANCOREADY) IVPB 750 mg/150 mL        750 mg 150 mL/hr over 60 Minutes Intravenous Every 12 hours 08/14/21 1142     08/14/21 1800  ceFEPIme (MAXIPIME) 2 g in sodium chloride 0.9 % 100 mL IVPB        2 g 200 mL/hr over 30 Minutes Intravenous Every 8 hours 08/14/21 1243     08/14/21 1145  vancomycin (VANCOREADY) IVPB 1500 mg/300 mL        1,500 mg 150 mL/hr over 120 Minutes Intravenous  Once 08/14/21 1142 08/14/21 1538   08/14/21 1000  ceFEPIme (MAXIPIME) 2 g in sodium chloride 0.9 % 100 mL IVPB        2 g 200 mL/hr over 30 Minutes Intravenous  Once 08/14/21 0947 08/14/21 1043  08/14/21 1000  azithromycin (ZITHROMAX) 500 mg in sodium chloride 0.9 % 250 mL IVPB        500 mg 250 mL/hr over 60 Minutes Intravenous  Once 08/14/21 0947 08/14/21 1051        Subjective: Patient seen and examined at bedside.  No overnight fever, vomiting, chest  pain reported.  Does not feel well.  Still short of breath with exertion.   Objective: Vitals:   08/16/21 2337 08/17/21 0152 08/17/21 0400 08/17/21 0601  BP: 110/62  (!) 101/58   Pulse:   68   Resp:   14   Temp:   97.6 F (36.4 C)   TempSrc:   Oral   SpO2:  100% 96%   Weight:    57.8 kg  Height:        Intake/Output Summary (Last 24 hours) at 08/17/2021 0746 Last data filed at 08/17/2021 0600 Gross per 24 hour  Intake --  Output 1700 ml  Net -1700 ml    Filed Weights   08/14/21 0910 08/17/21 0601  Weight: 67 kg 57.8 kg    Examination:  General exam: Still on 12 L high flow nasal cannula oxygen.  No distress.  Looks chronically ill and deconditioned Respiratory system: Decreased breath sounds at bases bilaterally with some crackles  cardiovascular system: Currently rate controlled; S1-S2 heard gastrointestinal system: Abdomen is distended slightly soft and nontender.  Bowel sounds are heard Extremities: Mild lower extremity edema present; no clubbing  Central nervous system: Awake and alert.  No focal neurological deficits.  Moves extremities  skin: No obvious ecchymosis/lesions  psychiatry: Mood, affect and judgment are normal    Data Reviewed: I have personally reviewed following labs and imaging studies  CBC: Recent Labs  Lab 08/14/21 0915 08/15/21 0306 08/16/21 0138 08/17/21 0106  WBC 19.3* 14.5* 14.8* 15.4*  NEUTROABS 17.4*  --   --  14.7*  HGB 11.6* 9.9* 8.7* 9.4*  HCT 36.8* 31.4* 28.2* 29.9*  MCV 92.5 89.5 89.5 88.7  PLT 295 211 248 379    Basic Metabolic Panel: Recent Labs  Lab 08/14/21 0915 08/15/21 0306 08/16/21 0138 08/17/21 0106  NA 134* 134* 135 135  K 4.3 3.9 3.9 3.7  CL 97* 100 96* 97*  CO2 28 26 33* 31  GLUCOSE 131* 145* 170* 165*  BUN 15 18 21  27*  CREATININE 0.59* 0.46* 0.52* 0.65  CALCIUM 8.4* 8.1* 8.0* 8.2*  MG  --   --  2.1 2.1    GFR: Estimated Creatinine Clearance: 56.2 mL/min (by C-G formula based on SCr of 0.65  mg/dL). Liver Function Tests: Recent Labs  Lab 08/15/21 0306 08/16/21 0138  AST 12* 14*  ALT 13 14  ALKPHOS 45 47  BILITOT 0.6 0.6  PROT 5.1* 4.8*  ALBUMIN 2.1* 1.9*    No results for input(s): LIPASE, AMYLASE in the last 168 hours. No results for input(s): AMMONIA in the last 168 hours. Coagulation Profile: Recent Labs  Lab 08/14/21 1114  INR 1.2    Cardiac Enzymes: No results for input(s): CKTOTAL, CKMB, CKMBINDEX, TROPONINI in the last 168 hours. BNP (last 3 results) No results for input(s): PROBNP in the last 8760 hours. HbA1C: No results for input(s): HGBA1C in the last 72 hours. CBG: No results for input(s): GLUCAP in the last 168 hours. Lipid Profile: No results for input(s): CHOL, HDL, LDLCALC, TRIG, CHOLHDL, LDLDIRECT in the last 72 hours. Thyroid Function Tests: Recent Labs    08/15/21 0306  TSH 0.526  Anemia Panel: No results for input(s): VITAMINB12, FOLATE, FERRITIN, TIBC, IRON, RETICCTPCT in the last 72 hours. Sepsis Labs: Recent Labs  Lab 08/14/21 1114  LATICACIDVEN 1.5     Recent Results (from the past 240 hour(s))  Resp Panel by RT-PCR (Flu A&B, Covid) Nasopharyngeal Swab     Status: None   Collection Time: 08/14/21  8:59 AM   Specimen: Nasopharyngeal Swab; Nasopharyngeal(NP) swabs in vial transport medium  Result Value Ref Range Status   SARS Coronavirus 2 by RT PCR NEGATIVE NEGATIVE Final    Comment: (NOTE) SARS-CoV-2 target nucleic acids are NOT DETECTED.  The SARS-CoV-2 RNA is generally detectable in upper respiratory specimens during the acute phase of infection. The lowest concentration of SARS-CoV-2 viral copies this assay can detect is 138 copies/mL. A negative result does not preclude SARS-Cov-2 infection and should not be used as the sole basis for treatment or other patient management decisions. A negative result may occur with  improper specimen collection/handling, submission of specimen other than nasopharyngeal swab,  presence of viral mutation(s) within the areas targeted by this assay, and inadequate number of viral copies(<138 copies/mL). A negative result must be combined with clinical observations, patient history, and epidemiological information. The expected result is Negative.  Fact Sheet for Patients:  EntrepreneurPulse.com.au  Fact Sheet for Healthcare Providers:  IncredibleEmployment.be  This test is no t yet approved or cleared by the [place] FDA and  has been authorized for detection and/or diagnosis of SARS-CoV-2 by FDA under an Emergency Use Authorization (EUA). This EUA will remain  in effect (meaning this test can be used) for the duration of the COVID-19 declaration under Section 564(b)(1) of the Act, 21 U.S.C.section 360bbb-3(b)(1), unless the authorization is terminated  or revoked sooner.       Influenza A by PCR NEGATIVE NEGATIVE Final   Influenza B by PCR NEGATIVE NEGATIVE Final    Comment: (NOTE) The Xpert Xpress SARS-CoV-2/FLU/RSV plus assay is intended as an aid in the diagnosis of influenza from Nasopharyngeal swab specimens and should not be used as a sole basis for treatment. Nasal washings and aspirates are unacceptable for Xpert Xpress SARS-CoV-2/FLU/RSV testing.  Fact Sheet for Patients: EntrepreneurPulse.com.au  Fact Sheet for Healthcare Providers: IncredibleEmployment.be  This test is not yet approved or cleared by the [place] FDA and has been authorized for detection and/or diagnosis of SARS-CoV-2 by FDA under an Emergency Use Authorization (EUA). This EUA will remain in effect (meaning this test can be used) for the duration of the COVID-19 declaration under Section 564(b)(1) of the Act, 21 U.S.C. section 360bbb-3(b)(1), unless the authorization is terminated or revoked.  Performed at [facility], [place] [address] [place], [place] [address]   Blood Culture  (routine x 2)     Status: None (Preliminary result)   Collection Time: 08/14/21 11:14 AM   Specimen: Right Antecubital; Blood  Result Value Ref Range Status   Specimen Description RIGHT ANTECUBITAL  Final   Special Requests   Final    BOTTLES DRAWN AEROBIC AND ANAEROBIC Blood Culture adequate volume   Culture   Final    NO GROWTH 2 DAYS Performed at [facility], [place] [address] [place], [place] [address]    Report Status PENDING  Incomplete  Urine Culture     Status: None   Collection Time: 08/14/21 11:18 AM   Specimen: In/Out Cath Urine  Result Value Ref Range Status   Specimen Description IN/OUT CATH URINE  Final   Special  Requests NONE  Final   Culture   Final    NO GROWTH Performed at [facility], [place] [address] [place], [place] [address]    Report Status 08/15/2021 FINAL  Final  Blood Culture (routine x 2)     Status: None (Preliminary result)   Collection Time: 08/14/21 11:25 AM   Specimen: BLOOD LEFT ARM  Result Value Ref Range Status   Specimen Description BLOOD LEFT ARM  Final   Special Requests   Final    BOTTLES DRAWN AEROBIC AND ANAEROBIC Blood Culture adequate volume   Culture   Final    NO GROWTH 2 DAYS Performed at [facility], [place] [address], [place], [place] [address]    Report Status PENDING  Incomplete  MRSA Next Gen by PCR, Nasal     Status: None   Collection Time: 08/14/21 11:43 AM   Specimen: Nasal Mucosa; Nasal Swab  Result Value Ref Range Status   MRSA by PCR Next Gen NOT DETECTED NOT DETECTED Final    Comment: (NOTE) The GeneXpert MRSA Assay (FDA approved for NASAL specimens only), is one component of a comprehensive MRSA colonization surveillance program. It is not intended to diagnose MRSA infection nor to guide or monitor treatment for MRSA infections. Test performance is not FDA approved in patients less than 35 years old. Performed at [facility], [place] [address], [place], [place] [address]            Radiology Studies: CT CHEST WO CONTRAST  Result Date: 08/15/2021 CLINICAL DATA:  Pneumonia. Patient returns after admission earlier this month. Abnormal chest x-ray. Progressive airspace disease. Marked hypoxia. Tachycardia. Shortness of breath. EXAM: CT CHEST WITHOUT CONTRAST TECHNIQUE: Multidetector CT imaging of the chest was performed following the standard protocol without IV contrast. COMPARISON:  One-view chest x-ray 08/14/2021.  CTA chest 07/27/2021. FINDINGS: Cardiovascular: Heart size is normal. Coronary artery calcifications are noted. Atherosclerotic changes are present in the aortic arch and great vessel origins without aneurysm or definite stenosis. Pulmonary arteries are enlarged, stable right main pulmonary artery measures 29 mm. Mediastinum/Nodes: No enlarged mediastinal or axillary lymph nodes. Thyroid gland, trachea, and esophagus demonstrate no significant findings. Lungs/Pleura: A right-sided pneumothorax is present anteriorly and inferiorly. Prominent pleural effusion is present. Progressive extensive airspace consolidation is present in the right lobe. The left lung demonstrates extensive centrilobular emphysematous change without focal airspace disease. No significant left-sided effusion or pneumothorax is present. Upper Abdomen: Atherosclerotic changes extend into the abdominal aorta without aneurysm. Left upper pole renal cyst again noted. Visualized abdomen is otherwise unremarkable. Musculoskeletal: Remote compression fractures at T11 and L1 again noted. Vertebral body heights otherwise maintained. No focal osseous lesions are present. The ribs are unremarkable. IMPRESSION: 1. Right-sided pneumothorax and large right pleural effusion. 2. Progressive airspace disease in the right lobe compatible with pneumonia. 3. Stable enlargement of the pulmonary arteries compatible with pulmonary arterial hypertension. 4. Coronary artery disease. 5. Remote compression fractures at T11  and L1. 6. Aortic Atherosclerosis (ICD10-I70.0) and Emphysema (ICD10-J43.9). These results were called by telephone at the time of interpretation on 08/15/2021 at 3:11 pm to provider [facility] , who verbally acknowledged these results. Electronically Signed   By: San Morelle M.D.   On: 08/15/2021 15:15   DG CHEST PORT 1 VIEW  Result Date: 08/16/2021 CLINICAL DATA:  Follow-up pneumothorax.  Shortness of breath. EXAM: PORTABLE CHEST 1 VIEW COMPARISON:  08/14/2021 and older studies.  CT, 08/15/2021. FINDINGS: Interval improvement in right  lung aeration. Airspace opacities on the right have improved. There are persistent linear type opacities extend superior laterally and inferiorly from the right hilum, which has a fibrotic appearance. Right pleural effusion obscures the hemidiaphragm. No visualized pneumothorax. Left lung is hyperexpanded, but clear. No left pleural effusion or pneumothorax. IMPRESSION: 1. Interval improvement in right lung aeration with a decrease in diffuse airspace opacities. No new lung abnormalities. 2. No visualized pneumothorax. The pneumothorax noted on the previous day's CT may be loculated at the anterior lung base and not resolved on the AP portable chest radiograph. 3. Small right pleural effusions similar to the previous day's CT. Electronically Signed   By: Lajean Manes M.D.   On: 08/16/2021 09:34        Scheduled Meds:  apixaban  5 mg Oral BID   arformoterol  15 mcg Nebulization BID   budesonide (PULMICORT) nebulizer solution  0.25 mg Nebulization BID   diltiazem  60 mg Oral Q6H   furosemide  40 mg Intravenous BID   guaiFENesin  1,200 mg Oral BID   ipratropium  0.5 mg Nebulization Q6H   levalbuterol  1.25 mg Nebulization Q6H   methylPREDNISolone (SOLU-MEDROL) injection  40 mg Intravenous Q12H   multivitamin with minerals  1 tablet Oral Daily   polyethylene glycol  17 g Oral q morning   pravastatin  40 mg Oral QPM   sodium chloride flush  3 mL  Intravenous Q12H   tamsulosin  0.4 mg Oral Daily   thiamine  100 mg Oral Daily   umeclidinium bromide  1 puff Inhalation Daily   vitamin B-12  1,000 mcg Oral Daily   Continuous Infusions:  sodium chloride     ceFEPime (MAXIPIME) IV 2 g (08/17/21 0535)   vancomycin 750 mg (08/17/21 0217)          Aline August, MD [facility] 08/17/2021, 7:46 AM

## 2021-08-17 NOTE — Progress Notes (Addendum)
° °  NAME:  [name], MRN:  [id number], DOB:  [birth year], LOS: 3 ADMISSION DATE:  08/14/2021, CONSULTATION DATE:  08/15/2021 REFERRING MD:  Laverna Peace MD, CHIEF COMPLAINT:   Pneumothorax  History of Present Illness:  88 year old with very severe COPD (FEV1 23% in 2012), coronary artery disease, hyperlipidemia with recent admission, discharged on 12/5 with pneumonia, COPD exacerbation, PE.  Readmitted with worsening dyspnea with HAP, atrial fibrillation with RVR.  He had a CT scan today which showed a new pneumothorax with worsening right lower lobe consolidation and PCCM consulted for evaluation and help with management  Pertinent  Medical History  Former Smoker  Emphysema COPD  Pulmonary Nodules HTN  Left Inguinal Hernia  Neuropathy   Significant Hospital Events: Including procedures, antibiotic start and stop dates in addition to other pertinent events   12/30 Admit  with SOB in setting of PNA, AFwRVR.  Pt comfortable, able to speak full sentences. Mild cough.   Interim History / Subjective:   Chest x-ray today showed increasing right-sided pneumothorax and chest tube was placed at bedside States that breathing is improved after placement of chest tube   Objective   Blood pressure (!) 110/59, pulse 82, temperature (!) 97.5 F (36.4 C), temperature source Oral, resp. rate (!) 22, height 5\' 9"  (1.753 m), weight 57.8 kg, SpO2 94 %.        Intake/Output Summary (Last 24 hours) at 08/17/2021 1147 Last data filed at 08/17/2021 0600 Gross per 24 hour  Intake --  Output 1000 ml  Net -1000 ml   Filed Weights   08/14/21 0910 08/17/21 0601  Weight: 67 kg 57.8 kg    Examination: Gen:      No acute distress HEENT:  EOMI, sclera anicteric Neck:     No masses; no thyromegaly Lungs:    Scattered crackles, diminished air entry on the right CV:         Regular rate and rhythm; no murmurs Abd:      + bowel sounds; soft, non-tender; no palpable masses, no distension Ext:    No  edema; adequate peripheral perfusion Skin:      Warm and dry; no rash Neuro: alert and oriented x 3 Psych: normal mood and affect   Chest x-ray reviewed with near resolution of right-sided pneumothorax after chest tube placement  Resolved Hospital Problem list     Assessment & Plan:   Recurrent pneumonia, hospital-acquired pneumonia Right pneumothorax in the setting of very severe COPD Acute on Chronic Diastolic Dysfunction  Recent PE on Eliquis  AF/Flutter  S/p chest tube placement Continue to suction Follow chest x-ray tomorrow Antibiotics per primary team Continue Solu-Medrol, Pulmicort, Brovana and incruse  Best Practice (right click and "Reselect all SmartList Selections" daily)  Per primary team  Critical care time: NA   Marshell Garfinkel MD [facility] See Amion for pager  If no response to pager , please call [phone] until 7pm After 7:00 pm call Elink  [phone] 08/17/2021, 1:12 PM

## 2021-08-17 NOTE — Care Management Important Message (Signed)
Important Message  Patient Details  Name: [name] MRN: [id number] Date of Birth: January 18, 1935   Medicare Important Message Given:  Yes     Orbie Pyo 08/17/2021, 2:50 PM

## 2021-08-18 ENCOUNTER — Inpatient Hospital Stay (HOSPITAL_COMMUNITY): Payer: Medicare Other

## 2021-08-18 DIAGNOSIS — J9621 Acute and chronic respiratory failure with hypoxia: Secondary | ICD-10-CM

## 2021-08-18 DIAGNOSIS — J189 Pneumonia, unspecified organism: Principal | ICD-10-CM

## 2021-08-18 DIAGNOSIS — J939 Pneumothorax, unspecified: Secondary | ICD-10-CM | POA: Diagnosis not present

## 2021-08-18 DIAGNOSIS — I4891 Unspecified atrial fibrillation: Secondary | ICD-10-CM | POA: Diagnosis not present

## 2021-08-18 DIAGNOSIS — J9601 Acute respiratory failure with hypoxia: Secondary | ICD-10-CM | POA: Diagnosis not present

## 2021-08-18 LAB — CBC WITH DIFFERENTIAL/PLATELET
Abs Immature Granulocytes: 0.07 10*3/uL (ref 0.00–0.07)
Basophils Absolute: 0 10*3/uL (ref 0.0–0.1)
Basophils Relative: 0 %
Eosinophils Absolute: 0 10*3/uL (ref 0.0–0.5)
Eosinophils Relative: 0 %
HCT: 29.6 % — ABNORMAL LOW (ref 39.0–52.0)
Hemoglobin: 9.2 g/dL — ABNORMAL LOW (ref 13.0–17.0)
Immature Granulocytes: 1 %
Lymphocytes Relative: 2 %
Lymphs Abs: 0.2 10*3/uL — ABNORMAL LOW (ref 0.7–4.0)
MCH: 27.9 pg (ref 26.0–34.0)
MCHC: 31.1 g/dL (ref 30.0–36.0)
MCV: 89.7 fL (ref 80.0–100.0)
Monocytes Absolute: 0.3 10*3/uL (ref 0.1–1.0)
Monocytes Relative: 2 %
Neutro Abs: 12.9 10*3/uL — ABNORMAL HIGH (ref 1.7–7.7)
Neutrophils Relative %: 95 %
Platelets: 266 10*3/uL (ref 150–400)
RBC: 3.3 MIL/uL — ABNORMAL LOW (ref 4.22–5.81)
RDW: 13.7 % (ref 11.5–15.5)
WBC: 13.4 10*3/uL — ABNORMAL HIGH (ref 4.0–10.5)
nRBC: 0 % (ref 0.0–0.2)

## 2021-08-18 LAB — BASIC METABOLIC PANEL
Anion gap: 8 (ref 5–15)
BUN: 33 mg/dL — ABNORMAL HIGH (ref 8–23)
CO2: 32 mmol/L (ref 22–32)
Calcium: 8.5 mg/dL — ABNORMAL LOW (ref 8.9–10.3)
Chloride: 98 mmol/L (ref 98–111)
Creatinine, Ser: 0.55 mg/dL — ABNORMAL LOW (ref 0.61–1.24)
GFR, Estimated: >60 mL/min (ref >60)
Glucose, Bld: 177 mg/dL — ABNORMAL HIGH (ref 70–99)
Potassium: 4.3 mmol/L (ref 3.5–5.1)
Sodium: 138 mmol/L (ref 135–145)

## 2021-08-18 LAB — MAGNESIUM: Magnesium: 2.1 mg/dL (ref 1.7–2.4)

## 2021-08-18 MED ORDER — SODIUM CHLORIDE 0.9 % IV SOLN
2.0000 g | Freq: Two times a day (BID) | INTRAVENOUS | Status: DC
  Administered 2021-08-18 – 2021-08-20 (×5): 2 g via INTRAVENOUS
  Filled 2021-08-18 (×5): qty 2

## 2021-08-18 MED ORDER — DILTIAZEM HCL ER 60 MG PO CP12
60.0000 mg | ORAL_CAPSULE | Freq: Two times a day (BID) | ORAL | Status: DC
  Administered 2021-08-18 – 2021-08-21 (×8): 60 mg via ORAL
  Filled 2021-08-18 (×8): qty 1

## 2021-08-18 MED ORDER — LEVALBUTEROL HCL 1.25 MG/0.5ML IN NEBU
1.2500 mg | INHALATION_SOLUTION | Freq: Three times a day (TID) | RESPIRATORY_TRACT | Status: DC
  Administered 2021-08-19: 1.25 mg via RESPIRATORY_TRACT
  Filled 2021-08-18: qty 0.5

## 2021-08-18 MED ORDER — IPRATROPIUM BROMIDE 0.02 % IN SOLN
0.5000 mg | Freq: Three times a day (TID) | RESPIRATORY_TRACT | Status: DC
  Administered 2021-08-19: 0.5 mg via RESPIRATORY_TRACT
  Filled 2021-08-18: qty 2.5

## 2021-08-18 NOTE — Progress Notes (Signed)
Progress Note  Patient Name: [name] Date of Encounter: 08/18/2021  [facility] Cardiologist: Sherren Mocha, MD   Subjective   Breathing is stable, got some sleep overnight. Chest tube remains in place. His main concern is about his diet order--on regular diet with 1.5 L fluid restriction, but he cannot have what he wants to order. Nurse reports that his diltiazem doses have been held due to low blood pressure.  Inpatient Medications    Scheduled Meds:  apixaban  5 mg Oral BID   arformoterol  15 mcg Nebulization BID   budesonide (PULMICORT) nebulizer solution  0.25 mg Nebulization BID   diltiazem  60 mg Oral Q12H   furosemide  40 mg Oral BID   guaiFENesin  1,200 mg Oral BID   ipratropium  0.5 mg Nebulization Q6H   levalbuterol  1.25 mg Nebulization Q6H   methylPREDNISolone (SOLU-MEDROL) injection  40 mg Intravenous Q12H   multivitamin with minerals  1 tablet Oral Daily   polyethylene glycol  17 g Oral q morning   pravastatin  40 mg Oral QPM   sodium chloride flush  10 mL Other Q8H   sodium chloride flush  3 mL Intravenous Q12H   tamsulosin  0.4 mg Oral Daily   thiamine  100 mg Oral Daily   umeclidinium bromide  1 puff Inhalation Daily   vitamin B-12  1,000 mcg Oral Daily   Continuous Infusions:  sodium chloride     ceFEPime (MAXIPIME) IV Stopped (08/18/21 0617)   vancomycin 750 mg (08/18/21 0156)   PRN Meds: sodium chloride, acetaminophen **OR** acetaminophen, HYDROcodone bit-homatropine, levalbuterol, melatonin, senna-docusate, sodium chloride flush   Vital Signs    Vitals:   08/18/21 0057 08/18/21 0400 08/18/21 0549 08/18/21 0800  BP:  (!) 104/57 110/70 117/67  Pulse: 80 77 77 94  Resp: 12 12 15 13   Temp:  97.9 F (36.6 C)  98.1 F (36.7 C)  TempSrc:  Oral  Oral  SpO2: 100% 100% 93%   Weight:      Height:        Intake/Output Summary (Last 24 hours) at 08/18/2021 0855 Last data filed at 08/18/2021 0845 Gross per 24 hour  Intake 2102.68 ml   Output 2045 ml  Net 57.68 ml   Last 3 Weights 08/17/2021 08/14/2021 07/30/2021  Weight (lbs) 127 lb 6.8 oz 147 lb 11.3 oz 149 lb 1.6 oz  Weight (kg) 57.8 kg 67 kg 67.631 kg      Telemetry    Atrial fibrillation, rates slowly rising - Personally Reviewed  ECG    No new ECG since 12/30 - Personally Reviewed  Physical Exam   GEN: Well nourished, well developed in no acute distress HEENT: Normal, moist mucous membranes NECK: No JVD CARDIAC: irregularly irregular rhythm, normal S1 and S2, no rubs or gallops. No murmur. VASCULAR: Radial and DP pulses 2+ bilaterally. No carotid bruits RESPIRATORY:  Diminished breath sounds throughout, coarse on R side >L ABDOMEN: Soft, non-tender, non-distended MUSCULOSKELETAL:  moves all 4 limbs independently SKIN: Warm and dry, no LE edema NEUROLOGIC:  Alert and oriented x 3. No focal neuro deficits noted. PSYCHIATRIC:  Normal affect    Labs    High Sensitivity Troponin:   Recent Labs  Lab 07/27/21 1213 07/27/21 1416  TROPONINIHS 7 8     Chemistry Recent Labs  Lab 08/15/21 0306 08/16/21 0138 08/17/21 0106 08/18/21 0125  NA 134* 135 135 138  K 3.9 3.9 3.7 4.3  CL 100 96* 97*  98  CO2 26 33* 31 32  GLUCOSE 145* 170* 165* 177*  BUN 18 21 27* 33*  CREATININE 0.46* 0.52* 0.65 0.55*  CALCIUM 8.1* 8.0* 8.2* 8.5*  MG  --  2.1 2.1 2.1  PROT 5.1* 4.8*  --   --   ALBUMIN 2.1* 1.9*  --   --   AST 12* 14*  --   --   ALT 13 14  --   --   ALKPHOS 45 47  --   --   BILITOT 0.6 0.6  --   --   GFRNONAA >60 >60 >60 >60  ANIONGAP 8 6 7 8     Lipids No results for input(s): CHOL, TRIG, HDL, LABVLDL, LDLCALC, CHOLHDL in the last 168 hours.  Hematology Recent Labs  Lab 08/16/21 0138 08/17/21 0106 08/18/21 0125  WBC 14.8* 15.4* 13.4*  RBC 3.15* 3.37* 3.30*  HGB 8.7* 9.4* 9.2*  HCT 28.2* 29.9* 29.6*  MCV 89.5 88.7 89.7  MCH 27.6 27.9 27.9  MCHC 30.9 31.4 31.1  RDW 13.9 13.7 13.7  PLT 248 250 266   Thyroid  Recent Labs  Lab  08/15/21 0306  TSH 0.526    BNP Recent Labs  Lab 08/14/21 0915  BNP 229.0*    DDimer No results for input(s): DDIMER in the last 168 hours.   Radiology    DG CHEST PORT 1 VIEW  Result Date: 08/18/2021 CLINICAL DATA:  Chest tube present, respiratory failure EXAM: PORTABLE CHEST 1 VIEW COMPARISON:  08/17/2021 FINDINGS: Right chest tube remains present. No definite residual pneumothorax. Decreased right pleural effusion. Right lung opacities are unchanged. Left lung remains clear. Stable cardiomediastinal contours. IMPRESSION: No definite pneumothorax. Decreased right pleural effusion. Unchanged right lung opacities. Electronically Signed   By: Macy Mis M.D.   On: 08/18/2021 08:15   DG CHEST PORT 1 VIEW  Result Date: 08/17/2021 CLINICAL DATA:  Chest tube placement. EXAM: PORTABLE CHEST 1 VIEW COMPARISON:  Chest x-ray earlier, same date. FINDINGS: Interval placement of a pigtail type pleural drainage catheter on the right side. Largely resolved pneumothorax. Tiny residual pneumothorax noted at the right lung base. Stable severe chronic underlying changes involving the right lung. The left lung remains relatively clear. IMPRESSION: Interval placement of a pigtail type pleural drainage catheter on the right side with near complete resolution of the pneumothorax. Tiny residual pneumothorax at the right lung base. Electronically Signed   By: Marijo Sanes M.D.   On: 08/17/2021 09:33   DG CHEST PORT 1 VIEW  Result Date: 08/17/2021 CLINICAL DATA:  Follow-up pneumothorax.  Shortness of breath. EXAM: PORTABLE CHEST 1 VIEW COMPARISON:  08/16/2021 FINDINGS: Stable cardiomediastinal contours. Interval recurrence of right lower pneumothorax. Now moderate to large measuring 6.2 x 9.7 cm. There is associated atelectasis of the right lower lobe and right middle lobe. Masslike architectural distortion, pleural thickening and volume loss within the right upper lobe appears unchanged from previous exam. Left  lung is clear. IMPRESSION: 1. Interval recurrence right lower pneumothorax. Now moderate to large. 2. Atelectasis of the right lower lobe and right middle lobe. 3. Stable masslike architectural distortion and volume loss within the right upper lobe. Electronically Signed   By: Kerby Moors M.D.   On: 08/17/2021 07:53    Cardiac Studies   Echo 07/28/21  1. Left ventricular ejection fraction, by estimation, is 60 to 65%. The  left ventricle has normal function. The left ventricle has no regional  wall motion abnormalities. Left ventricular diastolic function could not  be evaluated.   2. Right ventricular systolic function is normal. The right ventricular  size is moderately enlarged. There is mildly elevated pulmonary artery  systolic pressure.   3. Right atrial size was mildly dilated.   4. The mitral valve is grossly normal. No evidence of mitral valve  regurgitation.   5. Tricuspid valve regurgitation is mild to moderate.   6. The aortic valve is tricuspid. Aortic valve regurgitation is not  visualized. No aortic stenosis is present.   7. The inferior vena cava is normal in size with greater than 50%  respiratory variability, suggesting right atrial pressure of 3 mmHg.   Comparison(s): No significant change from prior study. Prior images  reviewed side by side.   Patient Profile     87 y.o. male with PMH CAD who is seen is consultation for atrial fibrillation at the request of Dr. Sloan Leiter  Assessment & Plan    Atrial fibrillation -initially RVR, now rate controlled though rates rising with holding diltiazem -BP borderline low, recent diltiazem doses have been held. Will split dosing and decrease to allow him to tolerate, changing to 60 mg BID -EF normal -CHA2DS2/VAS Stroke Risk Points=5  -tolerating apixaban, continue -no new ECG since 12/30  Acute on chronic diastolic heart failure -continue oral furosemide 40 mg BID -weights, ins/outs limited -admission weight 67 kg,  weight 1/2 57.8 kg, but charted only net negative 3.4 L. Does not agree -Cr 0.55, K 4.3 -discussed fluid restriction. He would like his diet liberalized. Will increase to 2L fluid restriction. Discussed this  COPD, severe Recent community acquired pneumonia, readmitted with hospital associated pneumonia Pneumothorax, right -management per pulmonology and hospitalist team -s/p chest tube placement  Recent pulmonary embolism -on apixaban  History of CAD -no ischemic symptoms -no aspirin as he is on apixaban -on pravastatin as an outpatient, reasonable to continue  For questions or updates, please contact [facility] Please consult [email or web address] for contact info under        Signed, Buford Dresser, MD  08/18/2021, 8:55 AM

## 2021-08-18 NOTE — NC FL2 (Signed)
[place] LEVEL OF CARE SCREENING TOOL     IDENTIFICATION  Patient Name: [name] Birthdate: [birth year] Sex: male Admission Date (Current Location): 08/14/2021  [facility] and [place] Number:  Herbalist and Address:  [place]. [facility], [place] [address] [place], [place] [address]      Provider Number: [id number]  Attending Physician Name and Address:  Aline August, MD  Relative Name and Phone Number:  Izora Gala [phone]    Current Level of Care: Hospital Recommended Level of Care: [place] Prior Approval Number:    Date Approved/Denied:   PASRR Number:    Discharge Plan: SNF    Current Diagnoses: Patient Active Problem List   Diagnosis Date Noted   Acute hypoxemic respiratory failure ([place]) 08/14/2021   Acute on chronic respiratory failure with hypoxia ([place]) 07/27/2021   Community acquired pneumonia    Acute pulmonary embolism ([place])    Benign prostatic hyperplasia with nocturia 09/20/2019   Closed fracture of first lumbar vertebra ([place]) 09/20/2019   Idiopathic progressive neuropathy 09/20/2019   Other fatigue 09/20/2019   Venous insufficiency of both lower extremities 09/20/2019   Anemia 07/25/2019   Left inguinal hernia 02/07/2019   Left leg pain 05/18/2018   Pneumonia 05/18/2018   Chronic respiratory failure with hypoxia ([place]) 01/12/2015   COPD with acute exacerbation ([place]) 01/21/2013   Hyperlipidemia 12/01/2011   Hypertension 12/01/2011   CAD (coronary artery disease) 05/23/2011   Lung nodule 09/16/2008   Allergic rhinitis 07/24/2007   COPD mixed type ([place]) 07/24/2007   BURSITIS 07/24/2007    Orientation RESPIRATION BLADDER Height & Weight     Self, Time, Situation, Place  O2 Continent Weight: 127 lb 6.8 oz (57.8 kg) Height:  5\' 9"  (175.3 cm)  BEHAVIORAL SYMPTOMS/MOOD NEUROLOGICAL BOWEL NUTRITION STATUS      Continent Diet (See DC summary)  AMBULATORY STATUS COMMUNICATION OF NEEDS Skin    Limited Assist Verbally Other (Comment) (Ecchymosis, arm and leg)                       Personal Care Assistance Level of Assistance  Bathing, Feeding, Dressing Bathing Assistance: Maximum assistance Feeding assistance: Independent Dressing Assistance: Maximum assistance     Functional Limitations Info  Sight, Hearing, Speech Sight Info: Impaired Hearing Info: Adequate Speech Info: Adequate    SPECIAL CARE FACTORS FREQUENCY  PT (By licensed PT), OT (By licensed OT)     PT Frequency: 5x per week OT Frequency: 5x per week            Contractures Contractures Info: Not present    Additional Factors Info  Code Status, Allergies Code Status Info: Partial Allergies Info: Tolectin (Tolmetin Sodium), Montelukast Sodium, Nsaids, Augmentin (Amoxicillin-pot Clavulanate), Neosporin (Bacitracin-polymyxin B)           Current Medications (08/18/2021):  This is the current hospital active medication list Current Facility-Administered Medications  Medication Dose Route Frequency Provider Last Rate Last Admin   0.9 %  sodium chloride infusion  250 mL Intravenous PRN Jonetta Osgood, MD       acetaminophen (TYLENOL) tablet 650 mg  650 mg Oral Q6H PRN Jonetta Osgood, MD   650 mg at 08/18/21 1023   Or   acetaminophen (TYLENOL) suppository 650 mg  650 mg Rectal Q6H PRN Jonetta Osgood, MD       apixaban Arne Cleveland) tablet 5 mg  5 mg Oral BID Ghimire, Henreitta Leber, MD  5 mg at 08/18/21 1023   arformoterol (BROVANA) nebulizer solution 15 mcg  15 mcg Nebulization BID Jonetta Osgood, MD   15 mcg at 08/18/21 0851   budesonide (PULMICORT) nebulizer solution 0.25 mg  0.25 mg Nebulization BID Jonetta Osgood, MD   0.25 mg at 08/18/21 0851   ceFEPIme (MAXIPIME) 2 g in sodium chloride 0.9 % 100 mL IVPB  2 g Intravenous Q12H Karren Cobble, RPH       diltiazem (CARDIZEM SR) 12 hr capsule 60 mg  60 mg Oral Q12H Buford Dresser, MD   60 mg at 08/18/21 1023   furosemide  (LASIX) tablet 40 mg  40 mg Oral BID Sherren Mocha, MD   40 mg at 08/18/21 0841   guaiFENesin (MUCINEX) 12 hr tablet 1,200 mg  1,200 mg Oral BID Caren Griffins, MD   1,200 mg at 08/18/21 1023   HYDROcodone bit-homatropine (HYCODAN) 5-1.5 MG/5ML syrup 5 mL  5 mL Oral Q4H PRN Mannam, Praveen, MD       ipratropium (ATROVENT) nebulizer solution 0.5 mg  0.5 mg Nebulization Q6H Ghimire, Shanker M, MD   0.5 mg at 08/18/21 1411   levalbuterol (XOPENEX) nebulizer solution 0.63 mg  0.63 mg Nebulization Q6H PRN Ghimire, Henreitta Leber, MD       levalbuterol Penne Lash) nebulizer solution 1.25 mg  1.25 mg Nebulization Q6H Ghimire, Shanker M, MD   1.25 mg at 08/18/21 1412   melatonin tablet 5 mg  5 mg Oral QHS PRN Jonetta Osgood, MD       methylPREDNISolone sodium succinate (SOLU-MEDROL) 40 mg/mL injection 40 mg  40 mg Intravenous Q12H Ollis, Brandi L, NP   40 mg at 08/18/21 0547   multivitamin with minerals tablet 1 tablet  1 tablet Oral Daily Jonetta Osgood, MD   1 tablet at 08/18/21 1023   polyethylene glycol (MIRALAX / GLYCOLAX) packet 17 g  17 g Oral q morning Jonetta Osgood, MD   17 g at 08/18/21 1023   pravastatin (PRAVACHOL) tablet 40 mg  40 mg Oral QPM Ghimire, Henreitta Leber, MD   40 mg at 08/17/21 1816   senna-docusate (Senokot-S) tablet 1 tablet  1 tablet Oral QHS PRN Ghimire, Henreitta Leber, MD       sodium chloride flush (NS) 0.9 % injection 10 mL  10 mL Other Q8H Mannam, Praveen, MD   10 mL at 08/18/21 1028   sodium chloride flush (NS) 0.9 % injection 3 mL  3 mL Intravenous Q12H Ghimire, Shanker M, MD   3 mL at 08/18/21 1027   sodium chloride flush (NS) 0.9 % injection 3 mL  3 mL Intravenous PRN Jonetta Osgood, MD       tamsulosin (FLOMAX) capsule 0.4 mg  0.4 mg Oral Daily Ghimire, Shanker M, MD   0.4 mg at 08/18/21 1023   thiamine tablet 100 mg  100 mg Oral Daily Ghimire, Shanker M, MD   100 mg at 08/18/21 1023   umeclidinium bromide (INCRUSE ELLIPTA) 62.5 MCG/ACT 1 puff  1 puff Inhalation  Daily Jonetta Osgood, MD   1 puff at 08/18/21 0851   vitamin B-12 (CYANOCOBALAMIN) tablet 1,000 mcg  1,000 mcg Oral Daily Jonetta Osgood, MD   1,000 mcg at 08/18/21 1023     Discharge Medications: Please see discharge summary for a list of discharge medications.  Relevant Imaging Results:  Relevant Lab Results:   Additional Information SSN# [id number] Pt has been vaccinated and boostered  [facility] M  Carie Caddy

## 2021-08-18 NOTE — TOC Progression Note (Addendum)
Transition of Care [facility]) - Progression Note    Patient Details  Name: [name] MRN: [id number] Date of Birth: [birth year]  Transition of Care [facility]) CM/SW [place], [place] Phone Number: 08/18/2021, 10:28 AM  Clinical Narrative:    CSW contacted pt to discuss Snf options ([address] [place], [place], [place], [place], and [place]). Pt asked CSW to contact him back bc there was a nurse in his room ad there has been a lot of people in and out all morning. CSW will follow up with this afternoon.  CSW contacted pt about SNF decision, pt was in the middle of his breathing treatment and asked if CSW could contact him back in about 51mins. CSW will follow up with pt.  CSW spoke with pt about SNF choices, pt is waiting to hear something back from his contact at [facility] and will follow up with CSW tomorrow. CSW explained that pt has other bed offers at two of his requested facilities ([facility] and [facility]). Pt would like [facility] if he is not able to get into [facility] by tomorrow. CSW confirmed bed with Lexine Baton at [facility] has a bed and will wait to hear from [facility] on placement for pt.   Expected Discharge Plan: [place] Barriers to Discharge: Continued Medical Work up, SNF Pending bed offer  Expected Discharge Plan and Services Expected Discharge Plan: [place] arrangements for the past 2 months: Single Family Home                                       Social Determinants of Health (SDOH) Interventions    Readmission Risk Interventions No flowsheet data found.

## 2021-08-18 NOTE — Plan of Care (Signed)
°  Problem: Activity: Goal: Ability to tolerate increased activity will improve Outcome: Progressing   Problem: Clinical Measurements: Goal: Ability to maintain a body temperature in the normal range will improve Outcome: Progressing   Problem: Respiratory: Goal: Ability to maintain adequate ventilation will improve Outcome: Progressing Goal: Ability to maintain a clear airway will improve Outcome: Progressing   Problem: Education: Goal: Knowledge of disease or condition will improve Outcome: Progressing Goal: Understanding of medication regimen will improve Outcome: Progressing Goal: Individualized Educational Video(s) Outcome: Progressing   Problem: Activity: Goal: Ability to tolerate increased activity will improve Outcome: Progressing   Problem: Cardiac: Goal: Ability to achieve and maintain adequate cardiopulmonary perfusion will improve Outcome: Progressing   Problem: Health Behavior/Discharge Planning: Goal: Ability to safely manage health-related needs after discharge will improve Outcome: Progressing   Problem: Education: Goal: Knowledge of General Education information will improve Description: Including pain rating scale, medication(s)/side effects and non-pharmacologic comfort measures Outcome: Progressing   Problem: Health Behavior/Discharge Planning: Goal: Ability to manage health-related needs will improve Outcome: Progressing   Problem: Clinical Measurements: Goal: Ability to maintain clinical measurements within normal limits will improve Outcome: Progressing Goal: Will remain free from infection Outcome: Progressing Goal: Diagnostic test results will improve Outcome: Progressing Goal: Respiratory complications will improve Outcome: Progressing Goal: Cardiovascular complication will be avoided Outcome: Progressing   Problem: Activity: Goal: Risk for activity intolerance will decrease Outcome: Progressing   Problem: Nutrition: Goal: Adequate  nutrition will be maintained Outcome: Progressing   Problem: Coping: Goal: Level of anxiety will decrease Outcome: Progressing   Problem: Elimination: Goal: Will not experience complications related to bowel motility Outcome: Progressing Goal: Will not experience complications related to urinary retention Outcome: Progressing   Problem: Pain Managment: Goal: General experience of comfort will improve Outcome: Progressing   Problem: Safety: Goal: Ability to remain free from injury will improve Outcome: Progressing   Problem: Skin Integrity: Goal: Risk for impaired skin integrity will decrease Outcome: Progressing

## 2021-08-18 NOTE — Progress Notes (Signed)
Patient ID: PERCELL LAMBOY, male   DOB: [birth year], 87 y.o.   MRN: [id number]    Progress Note from the Palliative Medicine Team at [facility]   Patient Name: [name]        Date: 08/18/2021 DOB: [birth year]  Age: 87 y.o. MRN#: [id number] Attending Physician: Aline August, MD Primary Care Physician: Gayland Curry, DO Admit Date: 08/14/2021   Medical records reviewed   87 y.o. male   admitted on 08/14/2021 with with medical history significant of COPD with chronic hypoxemic respiratory failure on home O2, CAD, HTN, HLD, BPH-who was just discharged from this hospital on 12/15 after being treated for pneumonia/COPD exacerbation/pulmonary embolism-he was discharged home on anticoagulation, steroid taper and antibiotics.     Post discharge-he was doing well and gradually improving.     Unfortunately he started having shortness of breath, he noticed that when he checked his pulse ox-his heart rate was in the 140s-150s range.  Since he continued to have persistent tachycardia on subsequent checks-he presented to the emergency room for further evaluation and treatment. Significant worsening of his chronic lower extremity edema    Patient lives at home with his wife who has healthcare issues of her own.         Admitted for treatment stabilization.  Palliative medicine was asked to explore goals of care within the setting of severe COPD and overall failure to thrive. Initial PMT consult on 08-17-21   Significant Hospital events. 12/30 Admit  with SOB in setting of PNA, AFwRVR.  Pt comfortable, able to speak full sentences. Mild cough.  08/17/2021 chest tube placement right side per Praveen Mannam MD 08/19/21 , chest tube to water seal, possibly DC chest tube in the morning.    This NP visited patient at the bedside as a follow up for palliative medicine needs and emotional support.  Continued education regarding current medical situation specific to his end-stage COPD and overall  failure to thrive..  Patient is tearful as he communicates to me a provider broached the subject of hospice today.  ;Education offered on hospice benefit philosophy and eligibility.   When speaking about the 2-month prognosis patient verbalizes that he "probably is there".     Education offered on the difference between aggressive medical intervention path versus a palliative comfort path for this patient at this time in this situation.  We explored the concept of adult failure to thrive and the limitations of medical interventions to prolong quality of life when the body fails to thrive.   Patient verbalizes an understanding of the seriousness of his current medical situation.  Emotional support offered.  Discussed with patient the importance of continued conversation with his  family and the medical providers regarding overall plan of care and treatment options,  ensuring decisions are within the context of the patients values and GOCs.  Disposition plan for now is transition to skilled nursing facility for short-term rehab with outpatient palliative services to follow.  PMT will continue to support holistically   Questions and concerns addressed   Discussed with Dr Bonner Puna  MDM high   Wadie Lessen NP  Palliative Medicine Team Team Phone # [phone] Pager [phone]

## 2021-08-18 NOTE — Progress Notes (Signed)
Patient ID: [name], male   DOB: 26-Jul-1935, 87 y.o.   MRN: [id number]  PROGRESS NOTE    JASHAN COTTEN  QBH:[id number] DOB: [birth year] DOA: 08/14/2021 PCP: Gayland Curry, DO   Brief Narrative:  87 year old male with history of COPD with chronic hypoxic respiratory failure on home oxygen, CAD, HTN, HLD, BPH, recently hospitalized and discharged on 12/15/2 after being treated for pneumonia/COPD exacerbation/PE presented with worsening shortness of breath.  On presentation, he was found to have heart rates in the 150s and required 10 L of oxygen.  Chest x-ray showed worsening pneumonia.  He was started on broad-spectrum antibiotics and Cardizem drip.  He was also found to have right-sided pneumothorax.  Cardiology and PCCM were consulted.  Chest x-ray on 08/17/2021 showed worsening pneumothorax for which he underwent chest tube placement by PCCM.  Cardizem drip has been converted to oral Cardizem by cardiology.  Palliative care was also consulted for goals of care discussion.  Assessment & Plan:   Acute on chronic respiratory failure with hypoxia Possible healthcare associated pneumonia Hemoptysis Severe COPD with exacerbation Right-sided pneumothorax -COVID-19 and influenza testing negative.  CT of chest showed right-sided pneumothorax and large right pleural effusion with progressive airspace disease in the right lobe compatible with pneumonia.   -Currently on broad-spectrum antibiotics.  Blood cultures negative so far.  Pulmonary following.  Chest x-ray on 08/17/2021 showed worsening pneumothorax.  Subsequently, patient underwent chest tube placement by PCCM.  Currently on 4 L oxygen via high flow nasal cannula. -Continue IV steroids along with nebs.  Paroxysmal A. fib with RVR -Cardiology following: Cardizem drip has been switched to oral Cardizem by cardiology on 08/16/2021.  Currently rate controlled.  Continue Eliquis -Recent echo earlier this month showed preserved EF  Acute on  chronic diastolic CHF -IV Lasix was changed to oral Lasix by cardiology on 08/17/2021.  Strict input and output.  Daily weights.  Fluid restriction.  Cardiology following.  Leukocytosis -Improving.  Monitor  Recent PE -Continue Eliquis  Possible anemia of chronic disease -From chronic illnesses.  Hemoglobin stable.  Essential hypertension -Continue Cardizem, Lasix  Hyperlipidemia -Continue statin  BPH -Continue Flomax  Goals of care -Patient was a DNR during last hospitalization.  During this hospitalization, he was admitted as full code but subsequently [place] has been changed to partial code (would want intubation in case of respiratory failure) -Overall condition is guarded to poor.  Palliative care following for goals of care discussion: Currently remains partial code.  Generalized deconditioning  -PT/OT recommend SNF placement.   DVT prophylaxis: Eliquis Code Status: Partial  family Communication: None at bedside Disposition Plan: Status is: Inpatient  Remains inpatient appropriate because: Of severity of illness  Consultants: PCCM/cardiology/palliative care  Procedures: Right-sided chest tube placement by PCCM on 08/17/2021  Antimicrobials:  Anti-infectives (From admission, onward)    Start     Dose/Rate Route Frequency Ordered Stop   08/15/21 0200  vancomycin (VANCOREADY) IVPB 750 mg/150 mL        750 mg 150 mL/hr over 60 Minutes Intravenous Every 12 hours 08/14/21 1142     08/14/21 1800  ceFEPIme (MAXIPIME) 2 g in sodium chloride 0.9 % 100 mL IVPB        2 g 200 mL/hr over 30 Minutes Intravenous Every 8 hours 08/14/21 1243     08/14/21 1145  vancomycin (VANCOREADY) IVPB 1500 mg/300 mL        1,500 mg 150 mL/hr over 120 Minutes Intravenous  Once 08/14/21 1142  08/14/21 1538   08/14/21 1000  ceFEPIme (MAXIPIME) 2 g in sodium chloride 0.9 % 100 mL IVPB        2 g 200 mL/hr over 30 Minutes Intravenous  Once 08/14/21 0947 08/14/21 1043   08/14/21 1000   azithromycin (ZITHROMAX) 500 mg in sodium chloride 0.9 % 250 mL IVPB        500 mg 250 mL/hr over 60 Minutes Intravenous  Once 08/14/21 0947 08/14/21 1051        Subjective: Patient seen and examined at bedside.  Feels slightly better. Slept better last night. Still short of breath with minimal exertion.  No overnight fever, chest pain, vomiting reported.   Objective: Vitals:   08/17/21 2200 08/18/21 0057 08/18/21 0400 08/18/21 0549  BP: (!) 107/57  (!) 104/57 110/70  Pulse: 81 80 77 77  Resp: 15 12 12 15   Temp: 98.5 F (36.9 C)  97.9 F (36.6 C)   TempSrc: Oral  Oral   SpO2: 100% 100% 100% 93%  Weight:      Height:        Intake/Output Summary (Last 24 hours) at 08/18/2021 0710 Last data filed at 08/18/2021 0547 Gross per 24 hour  Intake 1002.95 ml  Output 1975 ml  Net -972.05 ml    Filed Weights   08/14/21 0910 08/17/21 0601  Weight: 67 kg 57.8 kg    Examination:  General exam: No acute distress.  Currently on 4 L oxygen via high flow nasal cannula.  Looks chronically ill and deconditioned Respiratory system: Bilateral decreased breath sounds at bases with some scattered crackles.  Right-sided chest tube present cardiovascular system: S1-S2 heard; rate controlled  gastrointestinal system: Abdomen is mildly distended; soft and nontender.  Normal bowel sounds heard extremities: No cyanosis; trace lower extremity edema present  Central nervous system: Alert and oriented.  No focal neurological deficits.  Moving extremities  skin: No obvious petechiae/rashes  psychiatry: Judgment, affect and mood are normal   Data Reviewed: I have personally reviewed following labs and imaging studies  CBC: Recent Labs  Lab 08/14/21 0915 08/15/21 0306 08/16/21 0138 08/17/21 0106 08/18/21 0125  WBC 19.3* 14.5* 14.8* 15.4* 13.4*  NEUTROABS 17.4*  --   --  14.7* 12.9*  HGB 11.6* 9.9* 8.7* 9.4* 9.2*  HCT 36.8* 31.4* 28.2* 29.9* 29.6*  MCV 92.5 89.5 89.5 88.7 89.7  PLT 295 211  248 250 353    Basic Metabolic Panel: Recent Labs  Lab 08/14/21 0915 08/15/21 0306 08/16/21 0138 08/17/21 0106 08/18/21 0125  NA 134* 134* 135 135 138  K 4.3 3.9 3.9 3.7 4.3  CL 97* 100 96* 97* 98  CO2 28 26 33* 31 32  GLUCOSE 131* 145* 170* 165* 177*  BUN 15 18 21  27* 33*  CREATININE 0.59* 0.46* 0.52* 0.65 0.55*  CALCIUM 8.4* 8.1* 8.0* 8.2* 8.5*  MG  --   --  2.1 2.1 2.1    GFR: Estimated Creatinine Clearance: 56.2 mL/min (A) (by C-G formula based on SCr of 0.55 mg/dL (L)). Liver Function Tests: Recent Labs  Lab 08/15/21 0306 08/16/21 0138  AST 12* 14*  ALT 13 14  ALKPHOS 45 47  BILITOT 0.6 0.6  PROT 5.1* 4.8*  ALBUMIN 2.1* 1.9*    No results for input(s): LIPASE, AMYLASE in the last 168 hours. No results for input(s): AMMONIA in the last 168 hours. Coagulation Profile: Recent Labs  Lab 08/14/21 1114  INR 1.2    Cardiac Enzymes: No results for input(s): CKTOTAL,  CKMB, CKMBINDEX, TROPONINI in the last 168 hours. BNP (last 3 results) No results for input(s): PROBNP in the last 8760 hours. HbA1C: No results for input(s): HGBA1C in the last 72 hours. CBG: No results for input(s): GLUCAP in the last 168 hours. Lipid Profile: No results for input(s): CHOL, HDL, LDLCALC, TRIG, CHOLHDL, LDLDIRECT in the last 72 hours. Thyroid Function Tests: No results for input(s): TSH, T4TOTAL, FREET4, T3FREE, THYROIDAB in the last 72 hours.  Anemia Panel: No results for input(s): VITAMINB12, FOLATE, FERRITIN, TIBC, IRON, RETICCTPCT in the last 72 hours. Sepsis Labs: Recent Labs  Lab 08/14/21 1114  LATICACIDVEN 1.5     Recent Results (from the past 240 hour(s))  Resp Panel by RT-PCR (Flu A&B, Covid) Nasopharyngeal Swab     Status: None   Collection Time: 08/14/21  8:59 AM   Specimen: Nasopharyngeal Swab; Nasopharyngeal(NP) swabs in vial transport medium  Result Value Ref Range Status   SARS Coronavirus 2 by RT PCR NEGATIVE NEGATIVE Final    Comment:  (NOTE) SARS-CoV-2 target nucleic acids are NOT DETECTED.  The SARS-CoV-2 RNA is generally detectable in upper respiratory specimens during the acute phase of infection. The lowest concentration of SARS-CoV-2 viral copies this assay can detect is 138 copies/mL. A negative result does not preclude SARS-Cov-2 infection and should not be used as the sole basis for treatment or other patient management decisions. A negative result may occur with  improper specimen collection/handling, submission of specimen other than nasopharyngeal swab, presence of viral mutation(s) within the areas targeted by this assay, and inadequate number of viral copies(<138 copies/mL). A negative result must be combined with clinical observations, patient history, and epidemiological information. The expected result is Negative.  Fact Sheet for Patients:  EntrepreneurPulse.com.au  Fact Sheet for Healthcare Providers:  IncredibleEmployment.be  This test is no t yet approved or cleared by the [place] FDA and  has been authorized for detection and/or diagnosis of SARS-CoV-2 by FDA under an Emergency Use Authorization (EUA). This EUA will remain  in effect (meaning this test can be used) for the duration of the COVID-19 declaration under Section 564(b)(1) of the Act, 21 U.S.C.section 360bbb-3(b)(1), unless the authorization is terminated  or revoked sooner.       Influenza A by PCR NEGATIVE NEGATIVE Final   Influenza B by PCR NEGATIVE NEGATIVE Final    Comment: (NOTE) The Xpert Xpress SARS-CoV-2/FLU/RSV plus assay is intended as an aid in the diagnosis of influenza from Nasopharyngeal swab specimens and should not be used as a sole basis for treatment. Nasal washings and aspirates are unacceptable for Xpert Xpress SARS-CoV-2/FLU/RSV testing.  Fact Sheet for Patients: EntrepreneurPulse.com.au  Fact Sheet for Healthcare  Providers: IncredibleEmployment.be  This test is not yet approved or cleared by the [place] FDA and has been authorized for detection and/or diagnosis of SARS-CoV-2 by FDA under an Emergency Use Authorization (EUA). This EUA will remain in effect (meaning this test can be used) for the duration of the COVID-19 declaration under Section 564(b)(1) of the Act, 21 U.S.C. section 360bbb-3(b)(1), unless the authorization is terminated or revoked.  Performed at [facility], [place] [address], [place], [place] [address]   Blood Culture (routine x 2)     Status: None (Preliminary result)   Collection Time: 08/14/21 11:14 AM   Specimen: Right Antecubital; Blood  Result Value Ref Range Status   Specimen Description RIGHT ANTECUBITAL  Final   Special Requests   Final    BOTTLES DRAWN AEROBIC AND  ANAEROBIC Blood Culture adequate volume   Culture   Final    NO GROWTH 3 DAYS Performed at [facility], [place] [address], [place], [place] [address]    Report Status PENDING  Incomplete  Urine Culture     Status: None   Collection Time: 08/14/21 11:18 AM   Specimen: In/Out Cath Urine  Result Value Ref Range Status   Specimen Description IN/OUT CATH URINE  Final   Special Requests NONE  Final   Culture   Final    NO GROWTH Performed at [facility], [place] [address] [place], [place] [address]    Report Status 08/15/2021 FINAL  Final  Blood Culture (routine x 2)     Status: None (Preliminary result)   Collection Time: 08/14/21 11:25 AM   Specimen: BLOOD LEFT ARM  Result Value Ref Range Status   Specimen Description BLOOD LEFT ARM  Final   Special Requests   Final    BOTTLES DRAWN AEROBIC AND ANAEROBIC Blood Culture adequate volume   Culture   Final    NO GROWTH 3 DAYS Performed at [facility], [place] [address] [place], [place] [address]    Report Status PENDING  Incomplete  MRSA Next Gen by PCR, Nasal     Status: None   Collection Time: 08/14/21  11:43 AM   Specimen: Nasal Mucosa; Nasal Swab  Result Value Ref Range Status   MRSA by PCR Next Gen NOT DETECTED NOT DETECTED Final    Comment: (NOTE) The GeneXpert MRSA Assay (FDA approved for NASAL specimens only), is one component of a comprehensive MRSA colonization surveillance program. It is not intended to diagnose MRSA infection nor to guide or monitor treatment for MRSA infections. Test performance is not FDA approved in patients less than 61 years old. Performed at [facility], [place] [address] [place], [place] [address]           Radiology Studies: DG CHEST PORT 1 VIEW  Result Date: 08/17/2021 CLINICAL DATA:  Chest tube placement. EXAM: PORTABLE CHEST 1 VIEW COMPARISON:  Chest x-ray earlier, same date. FINDINGS: Interval placement of a pigtail type pleural drainage catheter on the right side. Largely resolved pneumothorax. Tiny residual pneumothorax noted at the right lung base. Stable severe chronic underlying changes involving the right lung. The left lung remains relatively clear. IMPRESSION: Interval placement of a pigtail type pleural drainage catheter on the right side with near complete resolution of the pneumothorax. Tiny residual pneumothorax at the right lung base. Electronically Signed   By: Marijo Sanes M.D.   On: 08/17/2021 09:33   DG CHEST PORT 1 VIEW  Result Date: 08/17/2021 CLINICAL DATA:  Follow-up pneumothorax.  Shortness of breath. EXAM: PORTABLE CHEST 1 VIEW COMPARISON:  08/16/2021 FINDINGS: Stable cardiomediastinal contours. Interval recurrence of right lower pneumothorax. Now moderate to large measuring 6.2 x 9.7 cm. There is associated atelectasis of the right lower lobe and right middle lobe. Masslike architectural distortion, pleural thickening and volume loss within the right upper lobe appears unchanged from previous exam. Left lung is clear. IMPRESSION: 1. Interval recurrence right lower pneumothorax. Now moderate to large. 2. Atelectasis of the  right lower lobe and right middle lobe. 3. Stable masslike architectural distortion and volume loss within the right upper lobe. Electronically Signed   By: Kerby Moors M.D.   On: 08/17/2021 07:53        Scheduled Meds:  apixaban  5 mg Oral BID   arformoterol  15 mcg Nebulization BID   budesonide (PULMICORT) nebulizer solution  0.25 mg Nebulization BID   diltiazem  180 mg Oral Daily   furosemide  40 mg Oral BID   guaiFENesin  1,200 mg Oral BID   ipratropium  0.5 mg Nebulization Q6H   levalbuterol  1.25 mg Nebulization Q6H   methylPREDNISolone (SOLU-MEDROL) injection  40 mg Intravenous Q12H   multivitamin with minerals  1 tablet Oral Daily   polyethylene glycol  17 g Oral q morning   pravastatin  40 mg Oral QPM   sodium chloride flush  10 mL Other Q8H   sodium chloride flush  3 mL Intravenous Q12H   tamsulosin  0.4 mg Oral Daily   thiamine  100 mg Oral Daily   umeclidinium bromide  1 puff Inhalation Daily   vitamin B-12  1,000 mcg Oral Daily   Continuous Infusions:  sodium chloride     ceFEPime (MAXIPIME) IV 2 g (08/18/21 0547)   vancomycin 750 mg (08/18/21 0156)          Aline August, MD [facility] 08/18/2021, 7:10 AM

## 2021-08-18 NOTE — Progress Notes (Signed)
This chaplain responded to PMT consult for spiritual care. The chaplain was updated by the Pt. RN-[place] before the visit.  The chaplain understands the Pt. is having a quiet moment, but agrees to the chaplain's presence and reflective listening. The chaplain is careful to respect the Pt. space with a soft voice and gentle presence.   The chaplain understands the Pt. has been in and out of the hospital for 5 weeks. In this time, he "worries" about his roles with his family and home.  The Pt. also "worries" about the quality of care in rehab after 4-5 more days of hospital care. The chaplain learned the Pt. has two sons, one local and one in [place]. The chaplain understands the Pt. is his wife's caregiver.  The chaplain understands the Pt. has stepped away from his faith community but hopes to grow closer to his religious beliefs. The Pt. accepts the chaplain invitation for prayer and tearfully accepts F/U spiritual care.   Chaplain Sallyanne Kuster [phone]

## 2021-08-18 NOTE — Progress Notes (Signed)
° °  NAME:  [name], MRN:  [id number], DOB:  11/07/1934, LOS: 4 ADMISSION DATE:  08/14/2021, CONSULTATION DATE:  08/15/2021 REFERRING MD:  Laverna Peace MD, CHIEF COMPLAINT:   Pneumothorax  History of Present Illness:  87 year old with very severe COPD (FEV1 23% in 2012), coronary artery disease, hyperlipidemia with recent admission, discharged on 12/5 with pneumonia, COPD exacerbation, PE.  Readmitted with worsening dyspnea with HAP, atrial fibrillation with RVR.  He had a CT scan today which showed a new pneumothorax with worsening right lower lobe consolidation and PCCM consulted for evaluation and help with management  Pertinent  Medical History  Former Smoker  Emphysema COPD  Pulmonary Nodules HTN  Left Inguinal Hernia  Neuropathy   Significant Hospital Events: Including procedures, antibiotic start and stop dates in addition to other pertinent events   12/30 Admit  with SOB in setting of PNA, AFwRVR.  Pt comfortable, able to speak full sentences. Mild cough.  08/17/2021 chest tube placement right side per Rivka Safer MD  Interim History / Subjective:  Reports feeling better.  No overt pneumothorax on chest x-ray.  330cc serous drainage over 24 hours from right pigtail catheter.    Objective   Blood pressure 117/67, pulse 94, temperature 98.1 F (36.7 C), temperature source Oral, resp. rate 13, height 5\' 9"  (1.753 m), weight 57.8 kg, SpO2 93 %.        Intake/Output Summary (Last 24 hours) at 08/18/2021 0840 Last data filed at 08/18/2021 0800 Gross per 24 hour  Intake 1002.95 ml  Output 2045 ml  Net -1042.05 ml   Filed Weights   08/14/21 0910 08/17/21 0601  Weight: 67 kg 57.8 kg    Examination: General: Frail elderly male in no acute distress at rest reports feeling better today on 08/18/2020 HEENT: MM pink/moist no JVD or lymphadenopathy is appreciated Neuro: Grossly intact without focal defect mood is normal expresses concern over chest tube removal CV: Heart  sounds are distant PULM: Coarse rhonchi bilaterally diminished on the right side right chest tube in place positive airleak with approximately 330 cc of drainage of serous sanguinous over 24-hour sats are 94% on 3 L nasal cannula GI: soft, bsx4 active  GU: Voids Extremities: warm/dry, negative edema  Skin: no rashes or lesions   Chest x-ray 08/18/2020 with no overt pneumothorax appreciated airspace disease in right noted without significant change.  Pigtail catheter in appropriate position.  Chest tube placed on 08/17/2021 right-sided pleural Sammuel Cooper MD   Assessment & Plan:   Recurrent pneumonia, hospital-acquired pneumonia Right pneumothorax in the setting of very severe COPD Acute on Chronic Diastolic Dysfunction  Recent PE on Eliquis  AF/Flutter  Post right-sided chest tube placement 08/17/2021 Chest x-ray 08/18/2021 demonstrates right pigtail chest tube in proper position with no overt pneumothorax Positive airleak 08/18/2020 precludes chest tube removal and remains on suction at 20 cm Check chest x-ray 08/19/2020 Continue O2 for severe O2 dependent COPD Continue antimicrobial therapy per primary team Continue Solu-Medrol, Pulmicort, Brovana and Incruse. Palliative care consult noted on 08/17/2021    Best Practice (right click and "Reselect all SmartList Selections" daily)  Per primary team  Critical care time: NA   Steve Kimorah Ridolfi ACNP Acute Care Nurse Practitioner [place] Please consult [facility] 08/18/2021, 8:42 AM

## 2021-08-19 ENCOUNTER — Ambulatory Visit: Payer: Self-pay | Admitting: *Deleted

## 2021-08-19 ENCOUNTER — Inpatient Hospital Stay (HOSPITAL_COMMUNITY): Payer: Medicare Other

## 2021-08-19 DIAGNOSIS — J9621 Acute and chronic respiratory failure with hypoxia: Secondary | ICD-10-CM | POA: Diagnosis not present

## 2021-08-19 DIAGNOSIS — J939 Pneumothorax, unspecified: Secondary | ICD-10-CM | POA: Diagnosis not present

## 2021-08-19 DIAGNOSIS — R351 Nocturia: Secondary | ICD-10-CM

## 2021-08-19 DIAGNOSIS — J9601 Acute respiratory failure with hypoxia: Secondary | ICD-10-CM | POA: Diagnosis not present

## 2021-08-19 DIAGNOSIS — N401 Enlarged prostate with lower urinary tract symptoms: Secondary | ICD-10-CM

## 2021-08-19 DIAGNOSIS — E7800 Pure hypercholesterolemia, unspecified: Secondary | ICD-10-CM

## 2021-08-19 DIAGNOSIS — I4891 Unspecified atrial fibrillation: Secondary | ICD-10-CM | POA: Diagnosis not present

## 2021-08-19 DIAGNOSIS — R531 Weakness: Secondary | ICD-10-CM

## 2021-08-19 DIAGNOSIS — I10 Essential (primary) hypertension: Secondary | ICD-10-CM

## 2021-08-19 LAB — CULTURE, BLOOD (ROUTINE X 2)
Culture: NO GROWTH
Culture: NO GROWTH
Special Requests: ADEQUATE
Special Requests: ADEQUATE

## 2021-08-19 MED ORDER — FUROSEMIDE 40 MG PO TABS
40.0000 mg | ORAL_TABLET | Freq: Every day | ORAL | Status: DC
  Administered 2021-08-19 – 2021-08-21 (×3): 40 mg via ORAL
  Filled 2021-08-19 (×3): qty 1

## 2021-08-19 MED ORDER — METHYLPREDNISOLONE SODIUM SUCC 40 MG IJ SOLR
40.0000 mg | Freq: Every day | INTRAMUSCULAR | Status: DC
  Administered 2021-08-20: 40 mg via INTRAVENOUS
  Filled 2021-08-19: qty 1

## 2021-08-19 MED ORDER — LEVALBUTEROL HCL 1.25 MG/0.5ML IN NEBU
1.2500 mg | INHALATION_SOLUTION | Freq: Two times a day (BID) | RESPIRATORY_TRACT | Status: DC
  Administered 2021-08-19 – 2021-08-21 (×5): 1.25 mg via RESPIRATORY_TRACT
  Filled 2021-08-19 (×5): qty 0.5

## 2021-08-19 MED ORDER — IPRATROPIUM BROMIDE 0.02 % IN SOLN
0.5000 mg | Freq: Two times a day (BID) | RESPIRATORY_TRACT | Status: DC
  Administered 2021-08-19 – 2021-08-21 (×5): 0.5 mg via RESPIRATORY_TRACT
  Filled 2021-08-19 (×5): qty 2.5

## 2021-08-19 MED ORDER — SENNOSIDES-DOCUSATE SODIUM 8.6-50 MG PO TABS
1.0000 | ORAL_TABLET | Freq: Two times a day (BID) | ORAL | Status: DC
  Filled 2021-08-19 (×5): qty 1

## 2021-08-19 NOTE — Consult Note (Signed)
° °  [facility] Inpatient Consult   08/19/2021  NYRON MOZER 05/07/1935 [id number]  [facility] [ACO] Patient: Medicare  Primary Care Provider:  Gayland Curry, DO, [facility], this provider is listed to do the [facility] calls and follow up appointments.   Patient is currently active with [facility] for chronic disease management services.  Patient has been engaged by a [facility].  Our community based plan of care has focused on disease management and community resource support.  Patient was also active with [facility] for out patient palliative program noted in encounters as well.  Patient is currently being recommended for a skilled nursing facility level of care for transition from the hospital.  Plan: Will follow with the Inpatient Transition Of Care [TOC] team member to make aware that [facility] following.    Of note, [facility] Care Management services does not replace or interfere with any services that are needed or arranged by inpatient [facility] care management team.  For additional questions or referrals please contact:   Natividad Brood, RN BSN [facility] Liaison  [phone] business mobile phone Toll free office [phone]  Fax number: [phone] [email or web address]

## 2021-08-19 NOTE — Progress Notes (Signed)
Physical Therapy Treatment Patient Details Name: [name] MRN: [id number] DOB: February 13, 1935 Today's Date: 08/19/2021   History of Present Illness The pt is an 87 yo male presenting 12/30 with SOB and tachycardia. Upon work up, pt with increased size of R pleural effusion and chest x-ray concerning for pneumonia. Cardiology consulted for new onset afib in setting of COPD exacerbation. PMH includes: COPD on 3L at rest and 4-6L O2 with exertion, CAD, HTN, HLD, and BPH.    PT Comments    Pt seen in session with OT due to poor activity tolerance. Pt anxious regarding mobility and easily overwhelmed. He required min/min guard assist bed mobility, and +2 min assist sit to stand with RW. Pt sat EOB x 10-15 to perform ADLs with OT as well as LE exercises. Static stand x 2-3 minutes with RW. Pt on 3.5L continuous O2. HR into 120s with activity. Pt returned to supine in bed at end of session.    Recommendations for follow up therapy are one component of a multi-disciplinary discharge planning process, led by the attending physician.  Recommendations may be updated based on patient status, additional functional criteria and insurance authorization.  Follow Up Recommendations  Skilled nursing-short term rehab (<3 hours/day)     Assistance Recommended at Discharge Frequent or constant Supervision/Assistance  Patient can return home with the following Assistance with cooking/housework;A lot of help with walking and/or transfers;Assist for transportation;Help with stairs or ramp for entrance;A lot of help with bathing/dressing/bathroom   Equipment Recommendations  None recommended by PT    Recommendations for Other Services       Precautions / Restrictions Precautions Precautions: Fall;Other (comment) Precaution Comments: R chest tube, anxiety regarding mobility, watch SpO2 and HR Restrictions Weight Bearing Restrictions: No     Mobility  Bed Mobility Overal bed mobility: Needs  Assistance Bed Mobility: Supine to Sit;Sit to Supine     Supine to sit: Min guard Sit to supine: Min guard   General bed mobility comments: assist with BLE back to bed due to fatigue    Transfers Overall transfer level: Needs assistance Equipment used: Rolling walker (2 wheels) Transfers: Sit to/from Stand Sit to Stand: Min assist;+2 physical assistance           General transfer comment: increased time to power up    Ambulation/Gait                   Stairs             Wheelchair Mobility    Modified Rankin (Stroke Patients Only)       Balance Overall balance assessment: Needs assistance Sitting-balance support: Feet supported;No upper extremity supported Sitting balance-Leahy Scale: Fair     Standing balance support: Bilateral upper extremity supported;Reliant on assistive device for balance;Single extremity supported Standing balance-Leahy Scale: Poor Standing balance comment: static stand with RW 2-3 minutes min guard assist                            Cognition Arousal/Alertness: Awake/alert Behavior During Therapy: Anxious Overall Cognitive Status: Impaired/Different from baseline Area of Impairment: Memory;Awareness;Safety/judgement;Problem solving                     Memory: Decreased short-term memory   Safety/Judgement: Decreased awareness of deficits;Decreased awareness of safety Awareness: Emergent Problem Solving: Difficulty sequencing;Requires verbal cues General Comments: Pt benefits from encouragement/humor - requires encouragement to progress activities due to anxiety with  OOB attempts. Decreased memory as pt reported these two current therapists were the same therapists from previous sessions        Exercises General Exercises - Lower Extremity Ankle Circles/Pumps: AROM;Both;10 reps;Seated Long Arc Quad: AROM;Right;Left;5 reps;Seated    General Comments General comments (skin integrity, edema, etc.):  HR into 120s with activity. Poor pleth noted with pulse ox, unable to attain reliable reading. RT provided breathing rx at end of session.      Pertinent Vitals/Pain Pain Assessment: Faces Faces Pain Scale: Hurts a little bit Pain Location: R side Pain Descriptors / Indicators: Grimacing Pain Intervention(s): Monitored during session;Repositioned    Home Living                          Prior Function            PT Goals (current goals can now be found in the care plan section) Acute Rehab PT Goals Patient Stated Goal: home Progress towards PT goals: Progressing toward goals    Frequency    Min 3X/week      PT Plan Current plan remains appropriate    Co-evaluation PT/OT/SLP Co-Evaluation/Treatment: Yes Reason for Co-Treatment: To address functional/ADL transfers;Other (comment) (decreased endurance; would not tolerate 2 separate sessions; +2 for pt confidence in attempting standing)   OT goals addressed during session: ADL's and self-care      AM-PAC PT "6 Clicks" Mobility   Outcome Measure  Help needed turning from your back to your side while in a flat bed without using bedrails?: A Little Help needed moving from lying on your back to sitting on the side of a flat bed without using bedrails?: A Little Help needed moving to and from a bed to a chair (including a wheelchair)?: A Lot Help needed standing up from a chair using your arms (e.g., wheelchair or bedside chair)?: A Lot Help needed to walk in hospital room?: Total Help needed climbing 3-5 steps with a railing? : Total 6 Click Score: 12    End of Session Equipment Utilized During Treatment: Oxygen Activity Tolerance: Patient tolerated treatment well Patient left: in bed;with call bell/phone within reach Nurse Communication: Mobility status PT Visit Diagnosis: Unsteadiness on feet (R26.81);Other abnormalities of gait and mobility (R26.89);Muscle weakness (generalized) (M62.81)     Time:  3790-2409 PT Time Calculation (min) (ACUTE ONLY): 31 min  Charges:  $Therapeutic Activity: 8-22 mins                     Lorrin Goodell, PT  Office # [phone] Pager [phone]    Lorriane Shire 08/19/2021, 9:34 AM

## 2021-08-19 NOTE — Progress Notes (Signed)
Pharmacy Antibiotic Note- follow-up  [name] is a 87 y.o. male admitted on 08/14/2021 with pneumonia.  Pharmacy was been consulted for Vanco, Cefepime dosing on 08/14/2021  08/18/2021- update- Vancomycin was discontinued  ID: ?cavitation on CXR. Possible HCAP with hemopytsis ,  Chest tube placed 1/2. - WBC 15.4 up today, Afebrile.   Antimicrobials this admission:  Vancomycin 12/30 > 08/18/2021 Cefepime 12/30 >   Dose adjustments this admission:   Microbiology results:  12/30 - UCx > neg 12/30 - BCx x 2 > ngtd 12/30- COVID-19 negative  12/30- Influenza A/B negative 12/30- MRSA PCR negative  Height: 5\' 9"  (175.3 cm) Weight: 56.7 kg (125 lb) IBW/kg (Calculated) : 70.7  Temp (24hrs), Avg:98 F (36.7 C), Min:97.7 F (36.5 C), Max:98.5 F (36.9 C)  Recent Labs  Lab 08/14/21 0915 08/14/21 1114 08/15/21 0306 08/16/21 0138 08/17/21 0106 08/18/21 0125  WBC 19.3*  --  14.5* 14.8* 15.4* 13.4*  CREATININE 0.59*  --  0.46* 0.52* 0.65 0.55*  LATICACIDVEN  --  1.5  --   --   --   --      Estimated Creatinine Clearance: 55.1 mL/min (A) (by C-G formula based on SCr of 0.55 mg/dL (L)).    Plan: Cefepime 2g IV q12hr (adjusted for CRCL ~55 ml/min)  Kylene Zamarron BS, PharmD, BCPS Clinical Pharmacist 08/19/2021 10:43 AM

## 2021-08-19 NOTE — Progress Notes (Signed)
Progress Note  Patient Name: [name] Date of Encounter: 08/19/2021  [facility] Cardiologist: Sherren Mocha, MD   Subjective   Tolerated split doses of diltiazem yesterday. Chest tube remains in place. Feels that breathing is stable. Affect is brighter today, about to work with OT this morning.  Inpatient Medications    Scheduled Meds:  apixaban  5 mg Oral BID   arformoterol  15 mcg Nebulization BID   budesonide (PULMICORT) nebulizer solution  0.25 mg Nebulization BID   diltiazem  60 mg Oral Q12H   furosemide  40 mg Oral Daily   guaiFENesin  1,200 mg Oral BID   ipratropium  0.5 mg Nebulization TID   levalbuterol  1.25 mg Nebulization TID   [START ON 08/20/2021] methylPREDNISolone (SOLU-MEDROL) injection  40 mg Intravenous Daily   multivitamin with minerals  1 tablet Oral Daily   polyethylene glycol  17 g Oral q morning   pravastatin  40 mg Oral QPM   sodium chloride flush  10 mL Other Q8H   sodium chloride flush  3 mL Intravenous Q12H   tamsulosin  0.4 mg Oral Daily   thiamine  100 mg Oral Daily   umeclidinium bromide  1 puff Inhalation Daily   vitamin B-12  1,000 mcg Oral Daily   Continuous Infusions:  sodium chloride     ceFEPime (MAXIPIME) IV 2 g (08/19/21 0539)   PRN Meds: sodium chloride, acetaminophen **OR** acetaminophen, HYDROcodone bit-homatropine, levalbuterol, melatonin, senna-docusate, sodium chloride flush   Vital Signs    Vitals:   08/19/21 0000 08/19/21 0400 08/19/21 0500 08/19/21 0749  BP: 99/63 121/72  122/62  Pulse: 83 80  91  Resp: 15 19  17   Temp: 98 F (36.7 C) 97.7 F (36.5 C)  98 F (36.7 C)  TempSrc: Oral Oral  Oral  SpO2: 98% 100%  92%  Weight:   56.7 kg   Height:        Intake/Output Summary (Last 24 hours) at 08/19/2021 0830 Last data filed at 08/19/2021 0750 Gross per 24 hour  Intake 1329.73 ml  Output 2020 ml  Net -690.27 ml   Last 3 Weights 08/19/2021 08/17/2021 08/14/2021  Weight (lbs) 125 lb 127 lb 6.8 oz 147 lb  11.3 oz  Weight (kg) 56.7 kg 57.8 kg 67 kg      Telemetry    Atrial fibrillation, rates improved - Personally Reviewed  ECG    No new ECG since 12/30 - Personally Reviewed  Physical Exam   GEN: Well nourished, well developed in no acute distress NECK: No JVD CARDIAC: irregularly irregular rhythm, normal S1 and S2, no rubs or gallops. No murmur. VASCULAR: Radial pulses 2+ bilaterally.  RESPIRATORY:  distant breath sounds throughout, coarse R>L, R chest tube in place ABDOMEN: Soft, non-tender, non-distended MUSCULOSKELETAL:  Moves all 4 limbs independently SKIN: Warm and dry, no edema NEUROLOGIC:  No focal neuro deficits noted. PSYCHIATRIC:  Normal affect    Labs    High Sensitivity Troponin:   Recent Labs  Lab 07/27/21 1213 07/27/21 1416  TROPONINIHS 7 8     Chemistry Recent Labs  Lab 08/15/21 0306 08/16/21 0138 08/17/21 0106 08/18/21 0125  NA 134* 135 135 138  K 3.9 3.9 3.7 4.3  CL 100 96* 97* 98  CO2 26 33* 31 32  GLUCOSE 145* 170* 165* 177*  BUN 18 21 27* 33*  CREATININE 0.46* 0.52* 0.65 0.55*  CALCIUM 8.1* 8.0* 8.2* 8.5*  MG  --  2.1 2.1 2.1  PROT 5.1* 4.8*  --   --   ALBUMIN 2.1* 1.9*  --   --   AST 12* 14*  --   --   ALT 13 14  --   --   ALKPHOS 45 47  --   --   BILITOT 0.6 0.6  --   --   GFRNONAA >60 >60 >60 >60  ANIONGAP 8 6 7 8     Lipids No results for input(s): CHOL, TRIG, HDL, LABVLDL, LDLCALC, CHOLHDL in the last 168 hours.  Hematology Recent Labs  Lab 08/16/21 0138 08/17/21 0106 08/18/21 0125  WBC 14.8* 15.4* 13.4*  RBC 3.15* 3.37* 3.30*  HGB 8.7* 9.4* 9.2*  HCT 28.2* 29.9* 29.6*  MCV 89.5 88.7 89.7  MCH 27.6 27.9 27.9  MCHC 30.9 31.4 31.1  RDW 13.9 13.7 13.7  PLT 248 250 266   Thyroid  Recent Labs  Lab 08/15/21 0306  TSH 0.526    BNP Recent Labs  Lab 08/14/21 0915  BNP 229.0*    DDimer No results for input(s): DDIMER in the last 168 hours.   Radiology    DG Chest Port 1 View  Result Date: 08/19/2021 CLINICAL  DATA:  Shortness of breath EXAM: PORTABLE CHEST 1 VIEW COMPARISON:  Previous studies including the examination of 08/18/2021 FINDINGS: Transverse diameter of heart is increased. Thoracic aorta is tortuous and ectatic. Left lung is clear. Significant fibrotic changes are noted in the right upper lung fields. Right chest tube is noted with its tip in the right mid lung fields. There is air soft tissue interface of the right costophrenic angle. There is no evidence of apical pneumothorax. Left lung is clear. IMPRESSION: Fibrotic changes are noted in the right upper lung fields. There are no new infiltrates or signs of pulmonary edema. There is possible small loculated pneumothorax at the right lateral costophrenic angle. There is no demonstrable right apical pneumothorax. Electronically Signed   By: Elmer Picker M.D.   On: 08/19/2021 08:25   DG CHEST PORT 1 VIEW  Result Date: 08/18/2021 CLINICAL DATA:  Chest tube present, respiratory failure EXAM: PORTABLE CHEST 1 VIEW COMPARISON:  08/17/2021 FINDINGS: Right chest tube remains present. No definite residual pneumothorax. Decreased right pleural effusion. Right lung opacities are unchanged. Left lung remains clear. Stable cardiomediastinal contours. IMPRESSION: No definite pneumothorax. Decreased right pleural effusion. Unchanged right lung opacities. Electronically Signed   By: Macy Mis M.D.   On: 08/18/2021 08:15   DG CHEST PORT 1 VIEW  Result Date: 08/17/2021 CLINICAL DATA:  Chest tube placement. EXAM: PORTABLE CHEST 1 VIEW COMPARISON:  Chest x-ray earlier, same date. FINDINGS: Interval placement of a pigtail type pleural drainage catheter on the right side. Largely resolved pneumothorax. Tiny residual pneumothorax noted at the right lung base. Stable severe chronic underlying changes involving the right lung. The left lung remains relatively clear. IMPRESSION: Interval placement of a pigtail type pleural drainage catheter on the right side with  near complete resolution of the pneumothorax. Tiny residual pneumothorax at the right lung base. Electronically Signed   By: Marijo Sanes M.D.   On: 08/17/2021 09:33    Cardiac Studies   Echo 07/28/21  1. Left ventricular ejection fraction, by estimation, is 60 to 65%. The  left ventricle has normal function. The left ventricle has no regional  wall motion abnormalities. Left ventricular diastolic function could not  be evaluated.   2. Right ventricular systolic function is normal. The right ventricular  size is moderately enlarged. There is  mildly elevated pulmonary artery  systolic pressure.   3. Right atrial size was mildly dilated.   4. The mitral valve is grossly normal. No evidence of mitral valve  regurgitation.   5. Tricuspid valve regurgitation is mild to moderate.   6. The aortic valve is tricuspid. Aortic valve regurgitation is not  visualized. No aortic stenosis is present.   7. The inferior vena cava is normal in size with greater than 50%  respiratory variability, suggesting right atrial pressure of 3 mmHg.   Comparison(s): No significant change from prior study. Prior images  reviewed side by side.   Patient Profile     87 y.o. male with PMH CAD who is seen is consultation for atrial fibrillation at the request of Dr. Sloan Leiter  Assessment & Plan    Atrial fibrillation -initially RVR, now rate controlled with diltiazem -Tolerating split dosing of 60 mg BID diltiazem, did not tolerated once daily dosing -EF normal -CHA2DS2/VAS Stroke Risk Points=5  -tolerating apixaban, continue -no new ECG since 12/30  Acute on chronic diastolic heart failure -will change to once daily lasix as he appears euvolemic -weights, ins/outs limited -admission weight 67 kg, weight  today 56.7 kg -no new labs yet this AM -we have discussed fluid restrictions, dietary recommendations  COPD, severe Recent community acquired pneumonia, readmitted with hospital associated  pneumonia Pneumothorax, right -management per pulmonology and hospitalist team -s/p chest tube placement. Duration to be determined by pulmonology  Recent pulmonary embolism -on apixaban  History of CAD -no ischemic symptoms -no aspirin as he is on apixaban -on pravastatin as an outpatient, reasonable to continue  [place] will sign off.   Medication Recommendations:   Apixaban 5 mg BID Diltiazem SR 60 mg BID Furosemide 40 mg daily Pravastatin 40 mg daily Other recommendations (labs, testing, etc):  none Follow up as an outpatient:  We will arrange for outpatient follow up with a member of Dr. Antionette Char team.   For questions or updates, please contact [facility] Please consult [email or web address] for contact info under        Signed, Buford Dresser, MD  08/19/2021, 8:30 AM

## 2021-08-19 NOTE — Progress Notes (Signed)
° °  NAME:  [name], MRN:  [id number], DOB:  03/16/1935, LOS: 5 ADMISSION DATE:  08/14/2021, CONSULTATION DATE:  08/15/2021 REFERRING MD:  Laverna Peace MD, CHIEF COMPLAINT:   Pneumothorax  History of Present Illness:  87 year old with very severe COPD (FEV1 23% in 2012), coronary artery disease, hyperlipidemia with recent admission, discharged on 12/5 with pneumonia, COPD exacerbation, PE.  Readmitted with worsening dyspnea with HAP, atrial fibrillation with RVR.  He had a CT scan today which showed a new pneumothorax with worsening right lower lobe consolidation and PCCM consulted for evaluation and help with management  Pertinent  Medical History  Former Smoker  Emphysema COPD  Pulmonary Nodules HTN  Left Inguinal Hernia  Neuropathy   Significant Hospital Events: Including procedures, antibiotic start and stop dates in addition to other pertinent events   12/30 Admit  with SOB in setting of PNA, AFwRVR.  Pt comfortable, able to speak full sentences. Mild cough.  08/17/2021 chest tube placement right side per Marshell Garfinkel MD  Interim History / Subjective:  Reports no change in breathing.  Chest x-ray without pneumo thorax    Objective   Blood pressure 122/62, pulse 91, temperature 98 F (36.7 C), temperature source Oral, resp. rate 17, height 5\' 9"  (1.753 m), weight 56.7 kg, SpO2 92 %.        Intake/Output Summary (Last 24 hours) at 08/19/2021 0824 Last data filed at 08/19/2021 0750 Gross per 24 hour  Intake 1329.73 ml  Output 2020 ml  Net -690.27 ml   Filed Weights   08/14/21 0910 08/17/21 0601 08/19/21 0500  Weight: 67 kg 57.8 kg 56.7 kg    Examination: General: Frail elderly male no acute distress HEENT: MM pink/moist no JVD is appreciated Neuro: Grossly intact without focal defect CV: Heart sounds are distant PULM: Decreased breath sounds on the right.  Right chest tube in place without airleak total of 330 cc of drainage over 24-hour. GI: soft, bsx4 active   GU: Voids Extremities: warm/dry, negative reports no significant change in breathing but breathing okay.  Edema  Skin: no rashes or lesions   Assessment & Plan:   Recurrent pneumonia, hospital-acquired pneumonia Right pneumothorax in the setting of very severe COPD Acute on Chronic Diastolic Dysfunction  Recent PE on Eliquis  AF/Flutter  Status post right-sided chest tube placement for spontaneous pneumothorax on 08/17/2021.  On 08/19/2021 no air leak is appreciated at this time.  Will place chest tube to waterseal check chest x-ray in a.m. 08/20/2021 if no pneumothorax consider DC chest tube. O2 as needed Decrease Solu-Medrol 08/19/2021  to 40 mg IV daily and transition to p.o. as he continues to improve Note  palliative care consult partial code with no CPR but intubation is allowed. Continue bronchodilator Antimicrobial therapy per primary team       Best Practice (right click and "Reselect all SmartList Selections" daily)  Per primary team  Critical care time: NA   Steve Reyne Falconi ACNP Acute Care Nurse Practitioner [place] Please consult Amion 08/19/2021, 8:24 AM

## 2021-08-19 NOTE — Progress Notes (Signed)
Occupational Therapy Treatment Patient Details Name: [name] MRN: [id number] DOB: Jun 11, 1935 Today's Date: 08/19/2021   History of present illness The pt is an 87 yo male presenting 12/30 with SOB and tachycardia. Upon work up, pt with increased size of R pleural effusion and chest x-ray concerning for pneumonia. Cardiology consulted for new onset afib in setting of COPD exacerbation. PMH includes: COPD on 3L at rest and 4-6L O2 with exertion, CAD, HTN, HLD, and BPH.   OT comments  Session focused on endurance for ADLs seated EOB and progression of standing attempts. Pt overall Min A for UB ADL and Mod A for LB ADLs, requiring increased assist with progressive fatigue/anxiety. Pt able to stand with Min A x 2 and RW briefly for ADLs/bedside steps but declined feeling up to recliner transfer today. Plan to further reinforce energy conservation and calming strategies during tasks in next session. DC recs remain appropriate.   Recommendations for follow up therapy are one component of a multi-disciplinary discharge planning process, led by the attending physician.  Recommendations may be updated based on patient status, additional functional criteria and insurance authorization.    Follow Up Recommendations  Skilled nursing-short term rehab (<3 hours/day)    Assistance Recommended at Discharge Intermittent Supervision/Assistance  Patient can return home with the following  A lot of help with walking and/or transfers;A lot of help with bathing/dressing/bathroom;Assistance with cooking/housework   Equipment Recommendations  BSC/3in1;Wheelchair (measurements OT);Wheelchair cushion (measurements OT)    Recommendations for Other Services      Precautions / Restrictions Precautions Precautions: Fall Precaution Comments: new chest tube/ watch O2 & HR Restrictions Weight Bearing Restrictions: No       Mobility Bed Mobility Overal bed mobility: Needs Assistance Bed Mobility: Supine to  Sit;Sit to Supine     Supine to sit: Min guard Sit to supine: Min assist   General bed mobility comments: Min A to get BLE back into bed, fatigued by end of session    Transfers Overall transfer level: Needs assistance Equipment used: Rolling walker (2 wheels) Transfers: Sit to/from Stand Sit to Stand: Min assist;+2 physical assistance           General transfer comment: MIn A x 2 vs Mod A x1; difficulty standing fully upright     Balance Overall balance assessment: Needs assistance Sitting-balance support: Bilateral upper extremity supported Sitting balance-Leahy Scale: Fair     Standing balance support: Bilateral upper extremity supported Standing balance-Leahy Scale: Poor                             ADL either performed or assessed with clinical judgement   ADL Overall ADL's : Needs assistance/impaired     Grooming: Set up;Sitting;Wash/dry face;Brushing hair   Upper Body Bathing: Minimal assistance;Sitting Upper Body Bathing Details (indicate cue type and reason): to bathe back Lower Body Bathing: Moderate assistance;Sitting/lateral leans;Sit to/from stand Lower Body Bathing Details (indicate cue type and reason): pt able to assist with bathing anterior peri region and thighs. assist for lower legs and posterior region in standing due to fatigue/impaired balance Upper Body Dressing : Minimal assistance;Sitting                     General ADL Comments: Progression of standing attempts with bathing tasks seated EOB    Extremity/Trunk Assessment Upper Extremity Assessment Upper Extremity Assessment: Generalized weakness   Lower Extremity Assessment Lower Extremity Assessment: Defer to PT evaluation  Vision   Vision Assessment?: No apparent visual deficits   Perception     Praxis      Cognition Arousal/Alertness: Awake/alert Behavior During Therapy: Anxious Overall Cognitive Status: Impaired/Different from baseline Area of  Impairment: Memory;Awareness;Safety/judgement;Problem solving                     Memory: Decreased short-term memory   Safety/Judgement: Decreased awareness of deficits;Decreased awareness of safety Awareness: Emergent Problem Solving: Difficulty sequencing;Requires verbal cues General Comments: Pt benefits from encouragement/humor - requires encouragement to progress activities due to anxiety with OOB attempts. Decreased memory as pt reported these two current therapists were the same therapists from previous sessions          Exercises     Shoulder Instructions       General Comments HR up to 120s with activity, poor pleth signal but RT in to provide breathing tx at end of session. NP and MD in during therapy session to talk to pt as well.    Pertinent Vitals/ Pain       Pain Assessment: Faces Faces Pain Scale: Hurts a little bit Pain Location: R side Pain Descriptors / Indicators: Grimacing Pain Intervention(s): Monitored during session  Home Living                                          Prior Functioning/Environment              Frequency  Min 2X/week        Progress Toward Goals  OT Goals(current goals can now be found in the care plan section)  Progress towards OT goals: Progressing toward goals  Acute Rehab OT Goals Patient Stated Goal: stand today OT Goal Formulation: With patient Time For Goal Achievement: 08/29/21 Potential to Achieve Goals: Fair ADL Goals Pt Will Perform Grooming: with min assist;sitting Pt Will Perform Upper Body Bathing: with min assist;sitting Pt Will Transfer to Toilet: with min assist;stand pivot transfer;bedside commode  Plan Discharge plan remains appropriate    Co-evaluation    PT/OT/SLP Co-Evaluation/Treatment: Yes Reason for Co-Treatment: To address functional/ADL transfers;Other (comment) (decreased endurance; would not tolerate 2 separate sessions; +2 for pt confidence in attempting  standing)   OT goals addressed during session: ADL's and self-care      AM-PAC OT "6 Clicks" Daily Activity     Outcome Measure   Help from another person eating meals?: A Little Help from another person taking care of personal grooming?: A Little Help from another person toileting, which includes using toliet, bedpan, or urinal?: A Lot Help from another person bathing (including washing, rinsing, drying)?: A Lot Help from another person to put on and taking off regular upper body clothing?: A Little Help from another person to put on and taking off regular lower body clothing?: Total 6 Click Score: 14    End of Session Equipment Utilized During Treatment: Rolling walker (2 wheels);Oxygen  OT Visit Diagnosis: Muscle weakness (generalized) (M62.81)   Activity Tolerance Patient limited by fatigue   Patient Left in bed;with call bell/phone within reach;Other (comment) (RT at bedside)   Nurse Communication          Time: 1950-9326 OT Time Calculation (min): 29 min  Charges: OT General Charges $OT Visit: 1 Visit OT Treatments $Self Care/Home Management : 8-22 mins  Malachy Chamber, OTR/L Acute Rehab Services Office: [phone]   Almyra Free  Layni Kreamer 08/19/2021, 9:22 AM

## 2021-08-19 NOTE — Progress Notes (Signed)
PROGRESS NOTE  [name]  OMV:[id number] DOB: 1935-08-07 DOA: 08/14/2021 PCP: Gayland Curry, DO   Brief Narrative: [name] is an 87 y.o. male with a history of COPD who was recently admitted for pneumonia, AECOPD, and PE discharged on 12/15 with antibiotics, steroid taper and anticoagulation, returning to the ED 12/30 with worsening dyspnea, rapid heart rate found to have worsened hypoxia, pneumonia, and atrial fibrillation with RVR. PCCM was ultimately consulted due to right pneumothorax, placed chest tube 08/17/2021.   Assessment & Plan: Principal Problem:   Acute hypoxemic respiratory failure (HCC) Active Problems:   COPD mixed type (HCC)   Hyperlipidemia   Hypertension   Pneumonia   Benign prostatic hyperplasia with nocturia   Acute on chronic respiratory failure with hypoxia (HCC)   Acute pulmonary embolism (HCC)  Acute on chronic hypoxic respiratory failure:  - Wean oxygen as tolerated with treatments below.   Acute exacerbation of severe COPD: FEV1 was 23% in 2012. - Taper steroids - Abx as below - Continue bronchodilator treatments per PCCM  Acute right PTX: Secondary to pneumonia - Chest tube to water seal this AM. PTX on CXR this AM looks like resolved to me. Will repeat in AM. Considering pleurodesis.  Healthcare associated RLL pneumonia:  - Complete abx with cefepime. Also s/p vancomycin.  Paroxysmal atrial fibrillation: RVR now resolved, LV systolic function preserved.  - Continue diltiazem 60mg  SR po BID - Continue eliquis due to CHA2DS2-VASc score of 5. - Cardiology has signed off, will arrange follow up with Dr. Antionette Char team.  Acute on chronic HFpEF, HTN:  - Continue lasix once daily, monitor I/O. Has decreased from 67kg on admission to 56kg on 1/4.  - Continue diltiazem as above  BPH:  - Cotninue tamsulosin  Pulmonary emboli:  - Continue maintenance dose eliquis at this point.  Failure to thrive, deconditioning, severe protein  calorie malnutrition: BMI 18.  - Supplement protein as able.  - Palliative care consulted given this and severe pulmonary disease. Prognosis likely qualifies for hospice care. Pt is partial code - no CPR - but otherwise desires trial of intubation, etc. Plan to follow with palliative care at [facility] after discharge.   HLD:  - Continue statin.  AOCD: Stable.   Constipation:  - Augment regimen and monitor. Abd is benign  DVT prophylaxis: Eliquis Code Status: Partial, no CPR. Family Communication: None at bedside Disposition Plan:  Status is: Inpatient  Remains inpatient appropriate because: Continues with chest tube  Consultants:  Cardiology PCCM  Procedures:  Right chest tube insertion 01/02 Dr. Vaughan Browner.  Antimicrobials: Vancomycin 12/30 - 01/02 Cefepime 12/30 >>    Subjective: Breathing about the same today, though overall improved steadily since admission. No palpitations, but reluctant to work with therapy due to dyspnea and high HR. No chest pain currently reported. Constipated and concerned about that.   Objective: Vitals:   08/19/21 0842 08/19/21 0918 08/19/21 1119 08/19/21 1605  BP:  (!) 126/112 127/66 (!) 131/118  Pulse:   95   Resp:   14 19  Temp:   97.8 F (36.6 C) 97.8 F (36.6 C)  TempSrc:   Oral Oral  SpO2: 92%  92% 91%  Weight:      Height:        Intake/Output Summary (Last 24 hours) at 08/19/2021 1653 Last data filed at 08/19/2021 1200 Gross per 24 hour  Intake 830 ml  Output 1930 ml  Net -1100 ml   Filed Weights   08/14/21 0910  08/17/21 0601 08/19/21 0500  Weight: 67 kg 57.8 kg 56.7 kg    Gen: Frail elderly male in no distress Pulm: Non-labored tachypnea at rest with diminished sounds on right. End-expiratory wheezing noted without crackles. Weak cough noted with some air bubbles in chest tube. CV: Regular rate and rhythm. No murmur, rub, or gallop. No JVD, 1+ pedal edema. GI: Abdomen soft, non-tender, non-distended, with normoactive bowel  sounds. No organomegaly or masses felt. Ext: Warm, no deformities Skin: No rashes, lesions or ulcers on visualized skin. CT insertion site is c/d/i Neuro: Alert and oriented. No focal neurological deficits. Psych: Judgement and insight appear normal. Mood is anxious & affect appropriate.   Data Reviewed: I have personally reviewed following labs and imaging studies  CBC: Recent Labs  Lab 08/14/21 0915 08/15/21 0306 08/16/21 0138 08/17/21 0106 08/18/21 0125  WBC 19.3* 14.5* 14.8* 15.4* 13.4*  NEUTROABS 17.4*  --   --  14.7* 12.9*  HGB 11.6* 9.9* 8.7* 9.4* 9.2*  HCT 36.8* 31.4* 28.2* 29.9* 29.6*  MCV 92.5 89.5 89.5 88.7 89.7  PLT 295 211 248 250 673   Basic Metabolic Panel: Recent Labs  Lab 08/14/21 0915 08/15/21 0306 08/16/21 0138 08/17/21 0106 08/18/21 0125  NA 134* 134* 135 135 138  K 4.3 3.9 3.9 3.7 4.3  CL 97* 100 96* 97* 98  CO2 28 26 33* 31 32  GLUCOSE 131* 145* 170* 165* 177*  BUN 15 18 21  27* 33*  CREATININE 0.59* 0.46* 0.52* 0.65 0.55*  CALCIUM 8.4* 8.1* 8.0* 8.2* 8.5*  MG  --   --  2.1 2.1 2.1   GFR: Estimated Creatinine Clearance: 55.1 mL/min (A) (by C-G formula based on SCr of 0.55 mg/dL (L)). Liver Function Tests: Recent Labs  Lab 08/15/21 0306 08/16/21 0138  AST 12* 14*  ALT 13 14  ALKPHOS 45 47  BILITOT 0.6 0.6  PROT 5.1* 4.8*  ALBUMIN 2.1* 1.9*   No results for input(s): LIPASE, AMYLASE in the last 168 hours. No results for input(s): AMMONIA in the last 168 hours. Coagulation Profile: Recent Labs  Lab 08/14/21 1114  INR 1.2   Cardiac Enzymes: No results for input(s): CKTOTAL, CKMB, CKMBINDEX, TROPONINI in the last 168 hours. BNP (last 3 results) No results for input(s): PROBNP in the last 8760 hours. HbA1C: No results for input(s): HGBA1C in the last 72 hours. CBG: No results for input(s): GLUCAP in the last 168 hours. Lipid Profile: No results for input(s): CHOL, HDL, LDLCALC, TRIG, CHOLHDL, LDLDIRECT in the last 72  hours. Thyroid Function Tests: No results for input(s): TSH, T4TOTAL, FREET4, T3FREE, THYROIDAB in the last 72 hours. Anemia Panel: No results for input(s): VITAMINB12, FOLATE, FERRITIN, TIBC, IRON, RETICCTPCT in the last 72 hours. Urine analysis:    Component Value Date/Time   COLORURINE YELLOW 08/14/2021 1113   APPEARANCEUR HAZY (A) 08/14/2021 1113   LABSPEC 1.023 08/14/2021 1113   PHURINE 5.0 08/14/2021 1113   GLUCOSEU NEGATIVE 08/14/2021 1113   HGBUR MODERATE (A) 08/14/2021 1113   BILIRUBINUR NEGATIVE 08/14/2021 [place] 08/14/2021 1113   PROTEINUR 30 (A) 08/14/2021 1113   NITRITE NEGATIVE 08/14/2021 1113   LEUKOCYTESUR NEGATIVE 08/14/2021 1113   Recent Results (from the past 240 hour(s))  Resp Panel by RT-PCR (Flu A&B, Covid) Nasopharyngeal Swab     Status: None   Collection Time: 08/14/21  8:59 AM   Specimen: Nasopharyngeal Swab; Nasopharyngeal(NP) swabs in vial transport medium  Result Value Ref Range Status   SARS Coronavirus  2 by RT PCR NEGATIVE NEGATIVE Final    Comment: (NOTE) SARS-CoV-2 target nucleic acids are NOT DETECTED.  The SARS-CoV-2 RNA is generally detectable in upper respiratory specimens during the acute phase of infection. The lowest concentration of SARS-CoV-2 viral copies this assay can detect is 138 copies/mL. A negative result does not preclude SARS-Cov-2 infection and should not be used as the sole basis for treatment or other patient management decisions. A negative result may occur with  improper specimen collection/handling, submission of specimen other than nasopharyngeal swab, presence of viral mutation(s) within the areas targeted by this assay, and inadequate number of viral copies(<138 copies/mL). A negative result must be combined with clinical observations, patient history, and epidemiological information. The expected result is Negative.  Fact Sheet for Patients:  EntrepreneurPulse.com.au  Fact  Sheet for Healthcare Providers:  IncredibleEmployment.be  This test is no t yet approved or cleared by the [place] FDA and  has been authorized for detection and/or diagnosis of SARS-CoV-2 by FDA under an Emergency Use Authorization (EUA). This EUA will remain  in effect (meaning this test can be used) for the duration of the COVID-19 declaration under Section 564(b)(1) of the Act, 21 U.S.C.section 360bbb-3(b)(1), unless the authorization is terminated  or revoked sooner.       Influenza A by PCR NEGATIVE NEGATIVE Final   Influenza B by PCR NEGATIVE NEGATIVE Final    Comment: (NOTE) The Xpert Xpress SARS-CoV-2/FLU/RSV plus assay is intended as an aid in the diagnosis of influenza from Nasopharyngeal swab specimens and should not be used as a sole basis for treatment. Nasal washings and aspirates are unacceptable for Xpert Xpress SARS-CoV-2/FLU/RSV testing.  Fact Sheet for Patients: EntrepreneurPulse.com.au  Fact Sheet for Healthcare Providers: IncredibleEmployment.be  This test is not yet approved or cleared by the [place] FDA and has been authorized for detection and/or diagnosis of SARS-CoV-2 by FDA under an Emergency Use Authorization (EUA). This EUA will remain in effect (meaning this test can be used) for the duration of the COVID-19 declaration under Section 564(b)(1) of the Act, 21 U.S.C. section 360bbb-3(b)(1), unless the authorization is terminated or revoked.  Performed at [facility], [employer] [address], [place], [place] [address]   Blood Culture (routine x 2)     Status: None   Collection Time: 08/14/21 11:14 AM   Specimen: Right Antecubital; Blood  Result Value Ref Range Status   Specimen Description RIGHT ANTECUBITAL  Final   Special Requests   Final    BOTTLES DRAWN AEROBIC AND ANAEROBIC Blood Culture adequate volume   Culture   Final    NO GROWTH 5 DAYS Performed at [place], [place] [address] [place], [place] [address]    Report Status 08/19/2021 FINAL  Final  Urine Culture     Status: None   Collection Time: 08/14/21 11:18 AM   Specimen: In/Out Cath Urine  Result Value Ref Range Status   Specimen Description IN/OUT CATH URINE  Final   Special Requests NONE  Final   Culture   Final    NO GROWTH Performed at [facility], [place] [address], [place], [place] [address]    Report Status 08/15/2021 FINAL  Final  Blood Culture (routine x 2)     Status: None   Collection Time: 08/14/21 11:25 AM   Specimen: BLOOD LEFT ARM  Result Value Ref Range Status   Specimen Description BLOOD LEFT ARM  Final   Special Requests   Final  BOTTLES DRAWN AEROBIC AND ANAEROBIC Blood Culture adequate volume   Culture   Final    NO GROWTH 5 DAYS Performed at [facility], [place] [address], [place], [place] [address]    Report Status 08/19/2021 FINAL  Final  MRSA Next Gen by PCR, Nasal     Status: None   Collection Time: 08/14/21 11:43 AM   Specimen: Nasal Mucosa; Nasal Swab  Result Value Ref Range Status   MRSA by PCR Next Gen NOT DETECTED NOT DETECTED Final    Comment: (NOTE) The GeneXpert MRSA Assay (FDA approved for NASAL specimens only), is one component of a comprehensive MRSA colonization surveillance program. It is not intended to diagnose MRSA infection nor to guide or monitor treatment for MRSA infections. Test performance is not FDA approved in patients less than 67 years old. Performed at [facility], [place] [address] [place], [place] [address]       Radiology Studies: DG Chest Port 1 View  Result Date: 08/19/2021 CLINICAL DATA:  Shortness of breath EXAM: PORTABLE CHEST 1 VIEW COMPARISON:  Previous studies including the examination of 08/18/2021 FINDINGS: Transverse diameter of heart is increased. Thoracic aorta is tortuous and ectatic. Left lung is clear. Significant fibrotic changes are noted in the right upper lung fields. Right chest tube is  noted with its tip in the right mid lung fields. There is air soft tissue interface of the right costophrenic angle. There is no evidence of apical pneumothorax. Left lung is clear. IMPRESSION: Fibrotic changes are noted in the right upper lung fields. There are no new infiltrates or signs of pulmonary edema. There is possible small loculated pneumothorax at the right lateral costophrenic angle. There is no demonstrable right apical pneumothorax. Electronically Signed   By: Elmer Picker M.D.   On: 08/19/2021 08:25   DG CHEST PORT 1 VIEW  Result Date: 08/18/2021 CLINICAL DATA:  Chest tube present, respiratory failure EXAM: PORTABLE CHEST 1 VIEW COMPARISON:  08/17/2021 FINDINGS: Right chest tube remains present. No definite residual pneumothorax. Decreased right pleural effusion. Right lung opacities are unchanged. Left lung remains clear. Stable cardiomediastinal contours. IMPRESSION: No definite pneumothorax. Decreased right pleural effusion. Unchanged right lung opacities. Electronically Signed   By: Macy Mis M.D.   On: 08/18/2021 08:15    Scheduled Meds:  apixaban  5 mg Oral BID   arformoterol  15 mcg Nebulization BID   budesonide (PULMICORT) nebulizer solution  0.25 mg Nebulization BID   diltiazem  60 mg Oral Q12H   furosemide  40 mg Oral Daily   guaiFENesin  1,200 mg Oral BID   ipratropium  0.5 mg Nebulization BID   levalbuterol  1.25 mg Nebulization BID   [START ON 08/20/2021] methylPREDNISolone (SOLU-MEDROL) injection  40 mg Intravenous Daily   multivitamin with minerals  1 tablet Oral Daily   polyethylene glycol  17 g Oral q morning   pravastatin  40 mg Oral QPM   sodium chloride flush  10 mL Other Q8H   sodium chloride flush  3 mL Intravenous Q12H   tamsulosin  0.4 mg Oral Daily   thiamine  100 mg Oral Daily   umeclidinium bromide  1 puff Inhalation Daily   vitamin B-12  1,000 mcg Oral Daily   Continuous Infusions:  sodium chloride     ceFEPime (MAXIPIME) IV 2 g  (08/19/21 0539)     LOS: 5 days   Time spent: 35 minutes.  Patrecia Pour, MD [facility] [email or web address] 08/19/2021, 4:53  PM

## 2021-08-19 NOTE — Progress Notes (Signed)
[place] changed. Total in old [place] 1923mL.

## 2021-08-20 ENCOUNTER — Inpatient Hospital Stay (HOSPITAL_COMMUNITY): Payer: Medicare Other

## 2021-08-20 DIAGNOSIS — J9601 Acute respiratory failure with hypoxia: Secondary | ICD-10-CM | POA: Diagnosis not present

## 2021-08-20 LAB — BASIC METABOLIC PANEL
Anion gap: 4 — ABNORMAL LOW (ref 5–15)
BUN: 32 mg/dL — ABNORMAL HIGH (ref 8–23)
CO2: 33 mmol/L — ABNORMAL HIGH (ref 22–32)
Calcium: 8.2 mg/dL — ABNORMAL LOW (ref 8.9–10.3)
Chloride: 99 mmol/L (ref 98–111)
Creatinine, Ser: 0.42 mg/dL — ABNORMAL LOW (ref 0.61–1.24)
GFR, Estimated: >60 mL/min (ref >60)
Glucose, Bld: 126 mg/dL — ABNORMAL HIGH (ref 70–99)
Potassium: 3.7 mmol/L (ref 3.5–5.1)
Sodium: 136 mmol/L (ref 135–145)

## 2021-08-20 NOTE — Progress Notes (Signed)
PROGRESS NOTE  [name]  QQI:[id number] DOB: [birth year] DOA: 08/14/2021 PCP: Gayland Curry, DO   Brief Narrative: [name] is an 87 y.o. male with a history of COPD who was recently admitted for pneumonia, AECOPD, and PE discharged on 12/15 with antibiotics, steroid taper and anticoagulation, returning to the ED 12/30 with worsening dyspnea, rapid heart rate found to have worsened hypoxia, pneumonia, and atrial fibrillation with RVR. PCCM was ultimately consulted due to right pneumothorax, placed chest tube 08/17/2021 with resolution of pneumothorax.Medical pleurodesis was offered but declined by patient. Chest tube clamped and subsequently removed 1/5.   Assessment & Plan: Principal Problem:   Acute hypoxemic respiratory failure (HCC) Active Problems:   COPD mixed type (HCC)   Hyperlipidemia   Hypertension   Pneumonia   Benign prostatic hyperplasia with nocturia   Acute on chronic respiratory failure with hypoxia (HCC)   Acute pulmonary embolism (HCC)  Acute on chronic hypoxic respiratory failure:  - Wean oxygen as tolerated with treatments below.   Acute exacerbation of severe COPD: FEV1 was 23% in 2012. Exacerbation improving. - DC steroids.  - Abx completed as below - Continue bronchodilator treatments per PCCM  Acute right PTX: Secondary to pneumonia. Has resolved, clamping trial 1/5 successful so tube pulled per PCM. Medical pleurodesis was offered (not surgical candidate) but declined.   Healthcare associated RLL pneumonia:  - Completed abx with cefepime. Also s/p vancomycin.  Paroxysmal atrial fibrillation: RVR now resolved, LV systolic function preserved.  - Continue diltiazem 60mg  SR po BID - Continue eliquis due to CHA2DS2-VASc score of 5. - Cardiology has signed off, will arrange follow up with Dr. Antionette Char team.  Acute on chronic HFpEF, HTN:  - Continue lasix once daily, monitor I/O. Has decreased from 67kg on admission to 56kg on 1/4.  -  Continue diltiazem as above  BPH:  - Continue tamsulosin  Pulmonary emboli:  - Continue maintenance dose eliquis    Failure to thrive, deconditioning, severe protein calorie malnutrition: BMI 18.  - Supplement protein as able.  - Palliative care consulted given this and severe pulmonary disease. Prognosis likely qualifies for hospice care. Pt is partial code - no CPR - but otherwise desires trial of intubation, etc. Plan to follow with palliative care at [facility] after discharge.   HLD:  - Continue statin.  AOCD: Stable.   Constipation:  - Augment regimen and monitor. Abd is benign  DVT prophylaxis: Eliquis Code Status: Partial, no CPR. Family Communication: None at bedside Disposition Plan:  Status is: Inpatient  Remains inpatient appropriate because: Would be stable for discharge to SNF on 1/6 if cleared by PCCM.  Consultants:  Cardiology PCCM  Procedures:  Right chest tube insertion 01/02 Dr. Vaughan Browner.  Antimicrobials: Vancomycin 12/30 - 01/02 Cefepime 12/30 - 1/5   Subjective: Feels overall better, breathing more easily but weak. No chest pain. Had not been using IS. This was demonstrated and encouraged.  Objective: Vitals:   08/20/21 0747 08/20/21 0909 08/20/21 1122 08/20/21 1605  BP: 118/63  108/62 (!) 103/42  Pulse: 81  82 87  Resp: 15  (!) 22 (!) 22  Temp: 98.6 F (37 C)  98.5 F (36.9 C) 98.5 F (36.9 C)  TempSrc: Oral  Oral Oral  SpO2: 92% 98% 98% 97%  Weight:      Height:        Intake/Output Summary (Last 24 hours) at 08/20/2021 1845 Last data filed at 08/20/2021 1700 Gross per 24 hour  Intake 963 ml  Output 820 ml  Net 143 ml   Filed Weights   08/17/21 0601 08/19/21 0500 08/20/21 0318  Weight: 57.8 kg 56.7 kg 57 kg   Gen: Elderly male in no distress Pulm: Nonlabored breathing supplemental oxygen. Improved aeration bilaterally. CV: Irreg irreg. No murmur, rub, or gallop. No JVD, trace dependent edema. GI: Abdomen soft, non-tender,  non-distended, with normoactive bowel sounds.  Ext: Warm, no deformities Skin: No new rashes, lesions or ulcers on visualized skin. CT site c/d/i Neuro: Alert and oriented. No focal neurological deficits. Psych: Judgement and insight appear fair. Mood euthymic & affect congruent. Behavior is appropriate.    Data Reviewed: I have personally reviewed following labs and imaging studies  CBC: Recent Labs  Lab 08/14/21 0915 08/15/21 0306 08/16/21 0138 08/17/21 0106 08/18/21 0125  WBC 19.3* 14.5* 14.8* 15.4* 13.4*  NEUTROABS 17.4*  --   --  14.7* 12.9*  HGB 11.6* 9.9* 8.7* 9.4* 9.2*  HCT 36.8* 31.4* 28.2* 29.9* 29.6*  MCV 92.5 89.5 89.5 88.7 89.7  PLT 295 211 248 250 378   Basic Metabolic Panel: Recent Labs  Lab 08/15/21 0306 08/16/21 0138 08/17/21 0106 08/18/21 0125 08/20/21 0151  NA 134* 135 135 138 136  K 3.9 3.9 3.7 4.3 3.7  CL 100 96* 97* 98 99  CO2 26 33* 31 32 33*  GLUCOSE 145* 170* 165* 177* 126*  BUN 18 21 27* 33* 32*  CREATININE 0.46* 0.52* 0.65 0.55* 0.42*  CALCIUM 8.1* 8.0* 8.2* 8.5* 8.2*  MG  --  2.1 2.1 2.1  --    GFR: Estimated Creatinine Clearance: 55.4 mL/min (A) (by C-G formula based on SCr of 0.42 mg/dL (L)). Liver Function Tests: Recent Labs  Lab 08/15/21 0306 08/16/21 0138  AST 12* 14*  ALT 13 14  ALKPHOS 45 47  BILITOT 0.6 0.6  PROT 5.1* 4.8*  ALBUMIN 2.1* 1.9*   No results for input(s): LIPASE, AMYLASE in the last 168 hours. No results for input(s): AMMONIA in the last 168 hours. Coagulation Profile: Recent Labs  Lab 08/14/21 1114  INR 1.2   Cardiac Enzymes: No results for input(s): CKTOTAL, CKMB, CKMBINDEX, TROPONINI in the last 168 hours. BNP (last 3 results) No results for input(s): PROBNP in the last 8760 hours. HbA1C: No results for input(s): HGBA1C in the last 72 hours. CBG: No results for input(s): GLUCAP in the last 168 hours. Lipid Profile: No results for input(s): CHOL, HDL, LDLCALC, TRIG, CHOLHDL, LDLDIRECT in the  last 72 hours. Thyroid Function Tests: No results for input(s): TSH, T4TOTAL, FREET4, T3FREE, THYROIDAB in the last 72 hours. Anemia Panel: No results for input(s): VITAMINB12, FOLATE, FERRITIN, TIBC, IRON, RETICCTPCT in the last 72 hours. Urine analysis:    Component Value Date/Time   COLORURINE YELLOW 08/14/2021 1113   APPEARANCEUR HAZY (A) 08/14/2021 1113   LABSPEC 1.023 08/14/2021 1113   PHURINE 5.0 08/14/2021 1113   GLUCOSEU NEGATIVE 08/14/2021 1113   HGBUR MODERATE (A) 08/14/2021 1113   BILIRUBINUR NEGATIVE 08/14/2021 [place] 08/14/2021 1113   PROTEINUR 30 (A) 08/14/2021 1113   NITRITE NEGATIVE 08/14/2021 1113   LEUKOCYTESUR NEGATIVE 08/14/2021 1113   Recent Results (from the past 240 hour(s))  Resp Panel by RT-PCR (Flu A&B, Covid) Nasopharyngeal Swab     Status: None   Collection Time: 08/14/21  8:59 AM   Specimen: Nasopharyngeal Swab; Nasopharyngeal(NP) swabs in vial transport medium  Result Value Ref Range Status   SARS Coronavirus 2 by RT PCR NEGATIVE NEGATIVE Final  Comment: (NOTE) SARS-CoV-2 target nucleic acids are NOT DETECTED.  The SARS-CoV-2 RNA is generally detectable in upper respiratory specimens during the acute phase of infection. The lowest concentration of SARS-CoV-2 viral copies this assay can detect is 138 copies/mL. A negative result does not preclude SARS-Cov-2 infection and should not be used as the sole basis for treatment or other patient management decisions. A negative result may occur with  improper specimen collection/handling, submission of specimen other than nasopharyngeal swab, presence of viral mutation(s) within the areas targeted by this assay, and inadequate number of viral copies(<138 copies/mL). A negative result must be combined with clinical observations, patient history, and epidemiological information. The expected result is Negative.  Fact Sheet for Patients:   EntrepreneurPulse.com.au  Fact Sheet for Healthcare Providers:  IncredibleEmployment.be  This test is no t yet approved or cleared by the [place] FDA and  has been authorized for detection and/or diagnosis of SARS-CoV-2 by FDA under an Emergency Use Authorization (EUA). This EUA will remain  in effect (meaning this test can be used) for the duration of the COVID-19 declaration under Section 564(b)(1) of the Act, 21 U.S.C.section 360bbb-3(b)(1), unless the authorization is terminated  or revoked sooner.       Influenza A by PCR NEGATIVE NEGATIVE Final   Influenza B by PCR NEGATIVE NEGATIVE Final    Comment: (NOTE) The Xpert Xpress SARS-CoV-2/FLU/RSV plus assay is intended as an aid in the diagnosis of influenza from Nasopharyngeal swab specimens and should not be used as a sole basis for treatment. Nasal washings and aspirates are unacceptable for Xpert Xpress SARS-CoV-2/FLU/RSV testing.  Fact Sheet for Patients: EntrepreneurPulse.com.au  Fact Sheet for Healthcare Providers: IncredibleEmployment.be  This test is not yet approved or cleared by the [place] FDA and has been authorized for detection and/or diagnosis of SARS-CoV-2 by FDA under an Emergency Use Authorization (EUA). This EUA will remain in effect (meaning this test can be used) for the duration of the COVID-19 declaration under Section 564(b)(1) of the Act, 21 U.S.C. section 360bbb-3(b)(1), unless the authorization is terminated or revoked.  Performed at [facility], [place] [address], [place], [place] [address]   Blood Culture (routine x 2)     Status: None   Collection Time: 08/14/21 11:14 AM   Specimen: Right Antecubital; Blood  Result Value Ref Range Status   Specimen Description RIGHT ANTECUBITAL  Final   Special Requests   Final    BOTTLES DRAWN AEROBIC AND ANAEROBIC Blood Culture adequate volume   Culture   Final     NO GROWTH 5 DAYS Performed at [facility], [place] [address] [place], [place] [address]    Report Status 08/19/2021 FINAL  Final  Urine Culture     Status: None   Collection Time: 08/14/21 11:18 AM   Specimen: In/Out Cath Urine  Result Value Ref Range Status   Specimen Description IN/OUT CATH URINE  Final   Special Requests NONE  Final   Culture   Final    NO GROWTH Performed at [facility], [place] [address] [place], [place] [address]    Report Status 08/15/2021 FINAL  Final  Blood Culture (routine x 2)     Status: None   Collection Time: 08/14/21 11:25 AM   Specimen: BLOOD LEFT ARM  Result Value Ref Range Status   Specimen Description BLOOD LEFT ARM  Final   Special Requests   Final    BOTTLES DRAWN AEROBIC AND ANAEROBIC Blood Culture adequate  volume   Culture   Final    NO GROWTH 5 DAYS Performed at [facility], [place] [address], [place], [place] [address]    Report Status 08/19/2021 FINAL  Final  MRSA Next Gen by PCR, Nasal     Status: None   Collection Time: 08/14/21 11:43 AM   Specimen: Nasal Mucosa; Nasal Swab  Result Value Ref Range Status   MRSA by PCR Next Gen NOT DETECTED NOT DETECTED Final    Comment: (NOTE) The GeneXpert MRSA Assay (FDA approved for NASAL specimens only), is one component of a comprehensive MRSA colonization surveillance program. It is not intended to diagnose MRSA infection nor to guide or monitor treatment for MRSA infections. Test performance is not FDA approved in patients less than 20 years old. Performed at [facility], [place] [address] [place],  [address]       Radiology Studies: DG CHEST PORT 1 VIEW  Result Date: 08/20/2021 CLINICAL DATA:  Follow-up pneumothorax EXAM: PORTABLE CHEST 1 VIEW COMPARISON:  08/20/2021 6:30 a.m. FINDINGS: Unchanged cardiac and mediastinal silhouette. Unchanged right pleural pigtail catheter. Unchanged patchy opacities throughout the right lung, with areas of consolidation and  lucency. Previously suspected extrapleural air at the right lung base is less conspicuous on the current exam. No definite pneumothorax. The left lung is clear. No acute osseous abnormality. IMPRESSION: Previously suspected extrapleural air at the right lung base is less conspicuous on the current exam. Attention on follow-up. Electronically Signed   By: Merilyn Baba M.D.   On: 08/20/2021 14:37   DG CHEST PORT 1 VIEW  Result Date: 08/20/2021 CLINICAL DATA:  Chest tube present, pneumothorax EXAM: PORTABLE CHEST 1 VIEW COMPARISON:  Chest radiograph 1 day prior FINDINGS: The cardiomediastinal silhouette is stable. The right-sided chest tube is stable. Patchy opacities throughout the right lung with areas of consolidation and lucency are overall not significantly changed since the prior radiograph. A possible small amount of extrapleural air is again seen in the lateral right base. No right apical pneumothorax is seen. Aeration of the left lung is unchanged. The bones are stable. IMPRESSION: Overall no significant interval change since the study from 1 day prior. Electronically Signed   By: Valetta Mole M.D.   On: 08/20/2021 08:32   DG Chest Port 1 View  Result Date: 08/19/2021 CLINICAL DATA:  Shortness of breath EXAM: PORTABLE CHEST 1 VIEW COMPARISON:  Previous studies including the examination of 08/18/2021 FINDINGS: Transverse diameter of heart is increased. Thoracic aorta is tortuous and ectatic. Left lung is clear. Significant fibrotic changes are noted in the right upper lung fields. Right chest tube is noted with its tip in the right mid lung fields. There is air soft tissue interface of the right costophrenic angle. There is no evidence of apical pneumothorax. Left lung is clear. IMPRESSION: Fibrotic changes are noted in the right upper lung fields. There are no new infiltrates or signs of pulmonary edema. There is possible small loculated pneumothorax at the right lateral costophrenic angle. There is no  demonstrable right apical pneumothorax. Electronically Signed   By: Elmer Picker M.D.   On: 08/19/2021 08:25    Scheduled Meds:  apixaban  5 mg Oral BID   arformoterol  15 mcg Nebulization BID   budesonide (PULMICORT) nebulizer solution  0.25 mg Nebulization BID   diltiazem  60 mg Oral Q12H   furosemide  40 mg Oral Daily   guaiFENesin  1,200 mg Oral BID   ipratropium  0.5  mg Nebulization BID   levalbuterol  1.25 mg Nebulization BID   multivitamin with minerals  1 tablet Oral Daily   polyethylene glycol  17 g Oral q morning   pravastatin  40 mg Oral QPM   senna-docusate  1 tablet Oral BID   sodium chloride flush  10 mL Other Q8H   sodium chloride flush  3 mL Intravenous Q12H   tamsulosin  0.4 mg Oral Daily   thiamine  100 mg Oral Daily   umeclidinium bromide  1 puff Inhalation Daily   vitamin B-12  1,000 mcg Oral Daily   Continuous Infusions:  sodium chloride     ceFEPime (MAXIPIME) IV 2 g (08/20/21 1746)     LOS: 6 days    Patrecia Pour, MD [facility] [email or web address] 08/20/2021, 6:45 PM

## 2021-08-20 NOTE — Progress Notes (Signed)
PCCM Brief Progress Note  Lung stayed up on clamping trial, no worsening dyspnea or CP. Chest tube removed and dressing applied.  [place]

## 2021-08-20 NOTE — Progress Notes (Signed)
This chaplain is present for Pt. F/U spiritual care. The chaplain accepts the Pt. invitation to sit down.   The chaplain listens reflectively as the Pt. updates the chaplain on his progress. The Pt. presents himself and affirms with the chaplain he has less worries today. The chaplain learns the Pt. recent conversations with family and the medical team offer the space for the Pt. to share he wants to be "happy and comfortable" as he proceeds with his medical care. The Pt. is hopeful the hospital will give him the space to "regain his strength before rehab" and continue an outpatient relationship with Palliative Care.  The chaplain revisited the Pt. desire to grow closer to his religious beliefs and offered the meditative, calming possibilities of memorizing scripture. The Pt. shared his familiarity with the Lord's Prayer and willingness to practice. Prayer was shared together.  This chaplain will continue to be present with F/U spiritual care as needed.  Chaplain Sallyanne Kuster [phone]

## 2021-08-20 NOTE — Progress Notes (Signed)
° °  NAME:  [name], MRN:  [id number], DOB:  11/19/1934, LOS: 6 ADMISSION DATE:  08/14/2021, CONSULTATION DATE:  08/15/2021 REFERRING MD:  Laverna Peace MD, CHIEF COMPLAINT:   Pneumothorax  History of Present Illness:  87 year old with very severe COPD (FEV1 23% in 2012), coronary artery disease, hyperlipidemia with recent admission, discharged on 12/5 with pneumonia, COPD exacerbation, PE.  Readmitted with worsening dyspnea with HAP, atrial fibrillation with RVR.  He had a CT scan today which showed a new pneumothorax with worsening right lower lobe consolidation and PCCM consulted for evaluation and help with management  Pertinent  Medical History  Former Smoker  Emphysema COPD  Pulmonary Nodules HTN  Left Inguinal Hernia  Neuropathy   Significant Hospital Events: Including procedures, antibiotic start and stop dates in addition to other pertinent events   12/30 Admit  with SOB in setting of PNA, AFwRVR.  Pt comfortable, able to speak full sentences. Mild cough.  08/17/2021 chest tube placement right side per Marshell Garfinkel MD  Interim History / Subjective:  Reports no change in breathing.  Chest x-ray without pneumo thorax    Objective   Blood pressure 118/63, pulse 81, temperature 98.6 F (37 C), temperature source Oral, resp. rate 15, height 5\' 9"  (1.753 m), weight 57 kg, SpO2 98 %.        Intake/Output Summary (Last 24 hours) at 08/20/2021 1028 Last data filed at 08/20/2021 0809 Gross per 24 hour  Intake 963 ml  Output 1110 ml  Net -147 ml   Filed Weights   08/17/21 0601 08/19/21 0500 08/20/21 0318  Weight: 57.8 kg 56.7 kg 57 kg    Examination: General appearance: 87 y.o., male, NAD, conversant frail/chronically ill appearing HENT: NCAT; MMM Lungs: Diminished bilaterally, no crackles, no wheeze, with normal respiratory effort CV: IRIR, no murmur  Abdomen: Soft, non-tender; non-distended, BS present  Extremities: trace peripheral edema, warm  CXR today without  ptx    Assessment & Plan:   Recurrent pneumonia, hospital-acquired pneumonia Secondary spontaneous right pneumothorax in the setting of very severe COPD, recent PE, and pneumonia Acute on Chronic Diastolic Dysfunction  Recent PE on Eliquis  AF/Flutter - chest tube clamped today, if repeat CXR this afternoon and symptoms stable then will remove - would stop steroids - continue bronchodilators, resume breztri at discharge - Antimicrobial therapy per primary team - I talked with the patient and son about options for prevention of recurrence of secondary spontaneous pneumothorax. Expectant with 30-50% recurrence risk in first year. Surgical pleurodesis easily best option but he is not candidate. Medical pleurodesis with either blood patch or chemical is 10-30% recurrence risk but adds risk of chronic chest pain, empyema, inflammatory reaction and would require keeping tube in for at least another day. He declines.  Will follow   Best Practice (right click and "Reselect all SmartList Selections" daily)  Per primary team  Critical care time: NA   [place]  Please consult Amion 08/20/2021, 10:28 AM

## 2021-08-21 ENCOUNTER — Other Ambulatory Visit: Payer: Self-pay

## 2021-08-21 ENCOUNTER — Encounter (HOSPITAL_COMMUNITY): Payer: Self-pay | Admitting: Internal Medicine

## 2021-08-21 ENCOUNTER — Inpatient Hospital Stay (HOSPITAL_COMMUNITY): Payer: Medicare Other

## 2021-08-21 DIAGNOSIS — J9601 Acute respiratory failure with hypoxia: Secondary | ICD-10-CM | POA: Diagnosis not present

## 2021-08-21 LAB — RESP PANEL BY RT-PCR (FLU A&B, COVID) ARPGX2
Influenza A by PCR: NEGATIVE
Influenza B by PCR: NEGATIVE
SARS Coronavirus 2 by RT PCR: NEGATIVE

## 2021-08-21 MED ORDER — APIXABAN 5 MG PO TABS: 5.0000 mg | ORAL_TABLET | Freq: Two times a day (BID) | ORAL | Status: AC

## 2021-08-21 MED ORDER — FUROSEMIDE 40 MG PO TABS: 40.0000 mg | ORAL_TABLET | Freq: Every day | ORAL | 0 refills | Status: AC

## 2021-08-21 MED ORDER — DILTIAZEM HCL ER 60 MG PO CP12: 60.0000 mg | ORAL_CAPSULE | Freq: Two times a day (BID) | ORAL | Status: AC

## 2021-08-21 NOTE — Progress Notes (Signed)
Patients transported to [facility] via [facility]. Discharge packet handed over, handoff given to the receiving facility by previous shift.

## 2021-08-21 NOTE — TOC Transition Note (Addendum)
Transition of Care [facility]) - CM/SW Discharge Note   Patient Details  Name: [name] MRN: [id number] Date of Birth: Dec 06, 1934  Transition of Care [facility]) CM/SW Contact:  Coralee Pesa, [place] Phone Number: 08/21/2021, 2:21 PM   Clinical Narrative:    Pt to be transported to [facility] via [facility], pending a negative covid test. Nurse to call report to [phone]. PASRR [id number]   Final next level of care: Skilled Nursing Facility Barriers to Discharge: Barriers Resolved   Patient Goals and CMS Choice   CMS Medicare.gov Compare Post Acute Care list provided to:: Patient    Discharge Placement              Patient [facility] bed at: [facility] Patient to be transferred to facility by: [facility] Name of family member notified: Shanon Brow Patient and family notified of of transfer: 08/21/21  Discharge Plan and Services                                     Social Determinants of Health (SDOH) Interventions     Readmission Risk Interventions No flowsheet data found.

## 2021-08-21 NOTE — Discharge Summary (Signed)
Physician Discharge Summary  PAZ WINSETT ZOX:[id number] DOB: 1935-08-09 DOA: 08/14/2021  PCP: Gayland Curry, DO  Admit date: 08/14/2021 Discharge date: 08/21/2021  Admitted From: Home Disposition: SNF   Recommendations for Outpatient Follow-up:  Follow up with PCP and pulmonary in next 2 weeks Outpatient palliative care should follow this patient Follow up with cardiology/AFib clinic, pt of Dr. Antionette Char  Home Health: N/A Equipment/Devices: Per SNF, continue supplemental oxygen at 3 LPM (baseline up to 6LPM at home) Discharge Condition: Stable CODE STATUS: Partial:  In the event of cardiac or respiratory ARREST: Initiate Code Blue, Call Rapid Response Yes  In the event of cardiac or respiratory ARREST: Perform CPR No  In the event of cardiac or respiratory ARREST: Perform Intubation/Mechanical Ventilation Yes  In the event of cardiac or respiratory ARREST: Use NIPPV/BiPAp only if indicated Yes  In the event of cardiac or respiratory ARREST: Administer ACLS medications if indicated Yes  In the event of cardiac or respiratory ARREST: Perform Defibrillation or Cardioversion if indicated Yes  Diet recommendation: Heart healthy  Brief/Interim Summary: [name] is an 87 y.o. male with a history of COPD who was recently admitted for pneumonia, AECOPD, and PE discharged on 12/15 with antibiotics, steroid taper and anticoagulation, returning to the ED 12/30 with worsening dyspnea, rapid heart rate found to have worsened hypoxia, pneumonia, and atrial fibrillation with RVR. Cardiology oversaw medication titration which has resulted in effective rate control and maintenance of euvolemia. PCCM was ultimately consulted due to right pneumothorax, placed chest tube 08/17/2021 with resolution of pneumothorax. Medical pleurodesis was offered but declined by patient. Chest tube clamped and subsequently removed 1/5 without further complications. Pulmonary has cleared the patient for discharge  from their perspective, having returned to baseline oxygen requirement. He remains severely deconditioned, so will pursue rehabilitation at SNF with plan for palliative care to follow after discharge from the hospital.  Discharge Diagnoses:  Principal Problem:   Acute hypoxemic respiratory failure ([place]) Active Problems:   COPD mixed type ([place])   Hyperlipidemia   Hypertension   Pneumonia   Benign prostatic hyperplasia with nocturia   Acute on chronic respiratory failure with hypoxia ([place])   Acute pulmonary embolism (HCC)  Acute on chronic hypoxic respiratory failure:  - Weaned back to 3L O2 baseline.   Acute exacerbation of severe COPD: FEV1 was 23% in 2012. Exacerbation improved, has completed steroids and antibiotics.  - Continue bronchodilator treatments, though no further inpatient work up or treatment is recomended per PCCM   Acute right PTX: Secondary to pneumonia. Has resolved, clamping trial 1/5 successful so tube pulled per PCCM. Medical pleurodesis was offered (not surgical candidate) but declined. Follow up CXR 1/6 shows sustained resolution.   Healthcare associated RLL pneumonia:  - Completed abx with cefepime. Also s/p vancomycin.   Paroxysmal atrial fibrillation: RVR now resolved, LV systolic function preserved.  - Continue diltiazem 60mg  SR po BID - Continue eliquis due to CHA2DS2-VASc score of 5. - Cardiology has signed off, will arrange follow up with Dr. Antionette Char team.   Acute on chronic HFpEF, HTN:  - Continue lasix once daily, monitor I/O. Has decreased from 67kg on admission to 56.4kg on 1/6.  - Continue diltiazem as above   BPH:  - Continue tamsulosin   Pulmonary emboli:  - Continue maintenance dose eliquis     Failure to thrive, deconditioning, severe protein calorie malnutrition: BMI 18.  - Supplement protein as able.  - Palliative care consulted given this and severe pulmonary disease.  Prognosis likely qualifies for hospice care. Pt is partial code -  no compressions - but otherwise desires trial of intubation, etc. Plan to follow with palliative care at [facility] after discharge.   Anxiety: Suspect this is contributing to presentation.  - Monitor and treat accordingly   HLD:  - Continue statin.   AOCD: Stable.    Constipation:  - Continue bowel regimen and monitor. Abd is benign  Discharge Instructions Discharge Instructions     Amb referral to AFIB Clinic   Complete by: As directed       Allergies as of 08/21/2021       Reactions   Tolectin [tolmetin Sodium] Other (See Comments)   BP low and passed out   Montelukast Sodium Other (See Comments)   Unknown reaction   Nsaids Other (See Comments)   MD told pt not to use   Augmentin [amoxicillin-pot Clavulanate] Nausea And Vomiting   Did it involve swelling of the face/tongue/throat, SOB, or low BP? No Did it involve sudden or severe rash/hives, skin peeling, or any reaction on the inside of your mouth or nose? No Did you need to seek medical attention at a hospital or doctor's office? No When did it last happen? Last year   If all above answers are "NO", may proceed with cephalosporin use.   Neosporin [bacitracin-polymyxin B] Rash        Medication List     STOP taking these medications    losartan 25 MG tablet Commonly known as: COZAAR       TAKE these medications    acetaminophen 650 MG CR tablet Commonly known as: TYLENOL Take 1,300 mg by mouth See admin instructions. Take 2 tablets (1300 mg) by mouth every morning, may also take 1-2 tablets ([phone] mg) at 4p-5p if needed for pain   apixaban 5 MG Tabs tablet Commonly known as: ELIQUIS Take 1 tablet (5 mg total) by mouth 2 (two) times daily. What changed:  how much to take additional instructions   Breztri Aerosphere 160-9-4.8 MCG/ACT Aero Generic drug: Budeson-Glycopyrrol-Formoterol INHALE 2 PUFFS INTO THE LUNGS IN THE MORNING AND AT BEDTIME What changed:  how much to take how to take this when to  take this additional instructions   diltiazem 60 MG 12 hr capsule Commonly known as: CARDIZEM SR Take 1 capsule (60 mg total) by mouth every 12 (twelve) hours.   Ensure High Protein Liqd Take 237 oz by mouth daily.   fluticasone 50 MCG/ACT nasal spray Commonly known as: FLONASE Place 1 spray into both nostrils daily as needed for allergies or rhinitis.   furosemide 40 MG tablet Commonly known as: LASIX Take 1 tablet (40 mg total) by mouth daily. Start taking on: August 22, 2021   guaiFENesin 600 MG 12 hr tablet Commonly known as: MUCINEX Take 600 mg by mouth 2 (two) times daily as needed for cough or to loosen phlegm.   ipratropium-albuterol 0.5-2.5 (3) MG/3ML Soln Commonly known as: DUONEB Take 3 mLs by nebulization every 6 (six) hours as needed (for SOB AND wheezing every 6 hours AND PRN). What changed: reasons to take this   ketoconazole 2 % shampoo Commonly known as: NIZORAL Apply 1 application topically 3 (three) times a week.   levalbuterol 45 MCG/ACT inhaler Commonly known as: XOPENEX HFA Inhale 2 puffs every 6 hours as needed- rescue What changed:  how much to take how to take this when to take this reasons to take this additional instructions   melatonin 5 MG  Tabs Take 5 mg by mouth at bedtime as needed (sleep).   OXYGEN Inhale 3-6 L into the lungs continuous. 3 at rest, 4-5 with exertion, 6 during shower   polyethylene glycol powder 17 GM/SCOOP powder Commonly known as: GLYCOLAX/MIRALAX Take 17 g by mouth every morning.   pravastatin 40 MG tablet Commonly known as: PRAVACHOL TAKE 1 TABLET(40 MG) BY MOUTH DAILY What changed: See the new instructions.   Systane 0.4-0.3 % Soln Generic drug: Polyethyl Glycol-Propyl Glycol Place 1 drop into both eyes 2 (two) times daily as needed (dry eyes).   tamsulosin 0.4 MG Caps capsule Commonly known as: FLOMAX Take two capsules by mouth once daily. What changed:  how much to take when to take  this additional instructions   vitamin B-12 1000 MCG tablet Commonly known as: CYANOCOBALAMIN Take 1 tablet (1,000 mcg total) by mouth daily. What changed: when to take this        Follow-up Information     Reed, Tiffany L, DO Follow up.   Specialty: Geriatric Medicine Contact information: [place] [address] [phone]         Sherren Mocha, MD .   Specialty: Cardiology Contact information: [phone] [address] [address] [phone]         Deneise Lever, MD Follow up.   Specialty: Pulmonary Disease Contact information: [address] [place] [address] [phone]                Allergies  Allergen Reactions   Tolectin [Tolmetin Sodium] Other (See Comments)    BP low and passed out   Montelukast Sodium Other (See Comments)    Unknown reaction   Nsaids Other (See Comments)    MD told pt not to use   Augmentin [Amoxicillin-Pot Clavulanate] Nausea And Vomiting    Did it involve swelling of the face/tongue/throat, SOB, or low BP? No Did it involve sudden or severe rash/hives, skin peeling, or any reaction on the inside of your mouth or nose? No Did you need to seek medical attention at a hospital or doctor's office? No When did it last happen? Last year   If all above answers are "NO", may proceed with cephalosporin use.   Neosporin [Bacitracin-Polymyxin B] Rash    Consultations: Cardiology PCCM  Procedures/Studies: CT CHEST WO CONTRAST  Result Date: 08/15/2021 CLINICAL DATA:  Pneumonia. Patient returns after admission earlier this month. Abnormal chest x-ray. Progressive airspace disease. Marked hypoxia. Tachycardia. Shortness of breath. EXAM: CT CHEST WITHOUT CONTRAST TECHNIQUE: Multidetector CT imaging of the chest was performed following the standard protocol without IV contrast. COMPARISON:  One-view chest x-ray 08/14/2021.  CTA chest 07/27/2021. FINDINGS: Cardiovascular: Heart  size is normal. Coronary artery calcifications are noted. Atherosclerotic changes are present in the aortic arch and great vessel origins without aneurysm or definite stenosis. Pulmonary arteries are enlarged, stable right main pulmonary artery measures 29 mm. Mediastinum/Nodes: No enlarged mediastinal or axillary lymph nodes. Thyroid gland, trachea, and esophagus demonstrate no significant findings. Lungs/Pleura: A right-sided pneumothorax is present anteriorly and inferiorly. Prominent pleural effusion is present. Progressive extensive airspace consolidation is present in the right lobe. The left lung demonstrates extensive centrilobular emphysematous change without focal airspace disease. No significant left-sided effusion or pneumothorax is present. Upper Abdomen: Atherosclerotic changes extend into the abdominal aorta without aneurysm. Left upper pole renal cyst again noted. Visualized abdomen is otherwise unremarkable. Musculoskeletal: Remote compression fractures at T11 and L1 again noted. Vertebral body  heights otherwise maintained. No focal osseous lesions are present. The ribs are unremarkable. IMPRESSION: 1. Right-sided pneumothorax and large right pleural effusion. 2. Progressive airspace disease in the right lobe compatible with pneumonia. 3. Stable enlargement of the pulmonary arteries compatible with pulmonary arterial hypertension. 4. Coronary artery disease. 5. Remote compression fractures at T11 and L1. 6. Aortic Atherosclerosis (ICD10-I70.0) and Emphysema (ICD10-J43.9). These results were called by telephone at the time of interpretation on 08/15/2021 at 3:11 pm to provider [facility] , who verbally acknowledged these results. Electronically Signed   By: San Morelle M.D.   On: 08/15/2021 15:15   CT Angio Chest PE W and/or Wo Contrast  Result Date: 07/27/2021 CLINICAL DATA:  Pneumonia, hypoxia. EXAM: CT ANGIOGRAPHY CHEST WITH CONTRAST TECHNIQUE: Multidetector CT imaging of the  chest was performed using the standard protocol during bolus administration of intravenous contrast. Multiplanar CT image reconstructions and MIPs were obtained to evaluate the vascular anatomy. CONTRAST:  54mL OMNIPAQUE IOHEXOL 350 MG/ML SOLN COMPARISON:  February 28, 2019. FINDINGS: Cardiovascular: Small filling defect is seen in a peripheral branch of the left pulmonary artery in lingular segment of left upper lobe. Atherosclerosis of thoracic aorta is noted without aneurysm or dissection. Normal cardiac size. No pericardial effusion. Mediastinum/Nodes: Small sliding-type hiatal hernia is noted. No adenopathy is noted. Thyroid gland is unremarkable. Lungs/Pleura: No pneumothorax or pleural effusion is noted. Emphysematous disease is noted. Right upper and lower lobe airspace opacities are noted concerning for pneumonia. Upper Abdomen: No acute abnormality. Musculoskeletal: No chest wall abnormality. No acute or significant osseous findings. Review of the MIP images confirms the above findings. IMPRESSION: Small peripheral pulmonary embolus is noted in upper lobe branch of left pulmonary artery. Critical Value/emergent results were called by telephone at the time of interpretation on 07/27/2021 at 1:58 pm to provider ADAM CURATOLO , who verbally acknowledged these results. Large airspace opacity is noted in right upper and lower lobes concerning for pneumonia. Small sliding-type hiatal hernia. Aortic Atherosclerosis (ICD10-I70.0) and Emphysema (ICD10-J43.9). Electronically Signed   By: Marijo Conception M.D.   On: 07/27/2021 13:58   DG CHEST PORT 1 VIEW  Result Date: 08/21/2021 CLINICAL DATA:  Right-sided chest tube removal. EXAM: PORTABLE CHEST 1 VIEW COMPARISON:  August 20, 2021. FINDINGS: The heart size and mediastinal contours are within normal limits. Left lung is clear. Right-sided chest tube has been removed. No definite pneumothorax is noted. Stable right upper and lower lobe lung opacities are noted with  probable small right pleural effusion. The visualized skeletal structures are unremarkable. IMPRESSION: No pneumothorax status post right-sided chest tube removal. Stable right lung findings as described above. Electronically Signed   By: Marijo Conception M.D.   On: 08/21/2021 11:54   DG CHEST PORT 1 VIEW  Result Date: 08/20/2021 CLINICAL DATA:  Follow-up pneumothorax EXAM: PORTABLE CHEST 1 VIEW COMPARISON:  08/20/2021 6:30 a.m. FINDINGS: Unchanged cardiac and mediastinal silhouette. Unchanged right pleural pigtail catheter. Unchanged patchy opacities throughout the right lung, with areas of consolidation and lucency. Previously suspected extrapleural air at the right lung base is less conspicuous on the current exam. No definite pneumothorax. The left lung is clear. No acute osseous abnormality. IMPRESSION: Previously suspected extrapleural air at the right lung base is less conspicuous on the current exam. Attention on follow-up. Electronically Signed   By: Merilyn Baba M.D.   On: 08/20/2021 14:37   DG CHEST PORT 1 VIEW  Result Date: 08/20/2021 CLINICAL DATA:  Chest tube present, pneumothorax EXAM: PORTABLE CHEST  1 VIEW COMPARISON:  Chest radiograph 1 day prior FINDINGS: The cardiomediastinal silhouette is stable. The right-sided chest tube is stable. Patchy opacities throughout the right lung with areas of consolidation and lucency are overall not significantly changed since the prior radiograph. A possible small amount of extrapleural air is again seen in the lateral right base. No right apical pneumothorax is seen. Aeration of the left lung is unchanged. The bones are stable. IMPRESSION: Overall no significant interval change since the study from 1 day prior. Electronically Signed   By: Valetta Mole M.D.   On: 08/20/2021 08:32   DG Chest Port 1 View  Result Date: 08/19/2021 CLINICAL DATA:  Shortness of breath EXAM: PORTABLE CHEST 1 VIEW COMPARISON:  Previous studies including the examination of  08/18/2021 FINDINGS: Transverse diameter of heart is increased. Thoracic aorta is tortuous and ectatic. Left lung is clear. Significant fibrotic changes are noted in the right upper lung fields. Right chest tube is noted with its tip in the right mid lung fields. There is air soft tissue interface of the right costophrenic angle. There is no evidence of apical pneumothorax. Left lung is clear. IMPRESSION: Fibrotic changes are noted in the right upper lung fields. There are no new infiltrates or signs of pulmonary edema. There is possible small loculated pneumothorax at the right lateral costophrenic angle. There is no demonstrable right apical pneumothorax. Electronically Signed   By: Elmer Picker M.D.   On: 08/19/2021 08:25   DG CHEST PORT 1 VIEW  Result Date: 08/18/2021 CLINICAL DATA:  Chest tube present, respiratory failure EXAM: PORTABLE CHEST 1 VIEW COMPARISON:  08/17/2021 FINDINGS: Right chest tube remains present. No definite residual pneumothorax. Decreased right pleural effusion. Right lung opacities are unchanged. Left lung remains clear. Stable cardiomediastinal contours. IMPRESSION: No definite pneumothorax. Decreased right pleural effusion. Unchanged right lung opacities. Electronically Signed   By: Macy Mis M.D.   On: 08/18/2021 08:15   DG CHEST PORT 1 VIEW  Result Date: 08/17/2021 CLINICAL DATA:  Chest tube placement. EXAM: PORTABLE CHEST 1 VIEW COMPARISON:  Chest x-ray earlier, same date. FINDINGS: Interval placement of a pigtail type pleural drainage catheter on the right side. Largely resolved pneumothorax. Tiny residual pneumothorax noted at the right lung base. Stable severe chronic underlying changes involving the right lung. The left lung remains relatively clear. IMPRESSION: Interval placement of a pigtail type pleural drainage catheter on the right side with near complete resolution of the pneumothorax. Tiny residual pneumothorax at the right lung base. Electronically  Signed   By: Marijo Sanes M.D.   On: 08/17/2021 09:33   DG CHEST PORT 1 VIEW  Result Date: 08/17/2021 CLINICAL DATA:  Follow-up pneumothorax.  Shortness of breath. EXAM: PORTABLE CHEST 1 VIEW COMPARISON:  08/16/2021 FINDINGS: Stable cardiomediastinal contours. Interval recurrence of right lower pneumothorax. Now moderate to large measuring 6.2 x 9.7 cm. There is associated atelectasis of the right lower lobe and right middle lobe. Masslike architectural distortion, pleural thickening and volume loss within the right upper lobe appears unchanged from previous exam. Left lung is clear. IMPRESSION: 1. Interval recurrence right lower pneumothorax. Now moderate to large. 2. Atelectasis of the right lower lobe and right middle lobe. 3. Stable masslike architectural distortion and volume loss within the right upper lobe. Electronically Signed   By: Kerby Moors M.D.   On: 08/17/2021 07:53   DG CHEST PORT 1 VIEW  Result Date: 08/16/2021 CLINICAL DATA:  Follow-up pneumothorax.  Shortness of breath. EXAM: PORTABLE CHEST 1 VIEW  COMPARISON:  08/14/2021 and older studies.  CT, 08/15/2021. FINDINGS: Interval improvement in right lung aeration. Airspace opacities on the right have improved. There are persistent linear type opacities extend superior laterally and inferiorly from the right hilum, which has a fibrotic appearance. Right pleural effusion obscures the hemidiaphragm. No visualized pneumothorax. Left lung is hyperexpanded, but clear. No left pleural effusion or pneumothorax. IMPRESSION: 1. Interval improvement in right lung aeration with a decrease in diffuse airspace opacities. No new lung abnormalities. 2. No visualized pneumothorax. The pneumothorax noted on the previous day's CT may be loculated at the anterior lung base and not resolved on the AP portable chest radiograph. 3. Small right pleural effusions similar to the previous day's CT. Electronically Signed   By: Lajean Manes M.D.   On: 08/16/2021 09:34    DG Chest Port 1 View  Result Date: 08/14/2021 CLINICAL DATA:  Shortness of breath since yesterday EXAM: PORTABLE CHEST 1 VIEW COMPARISON:  Chest radiograph 07/27/2021, CTA chest 07/27/2021 FINDINGS: Cardiomediastinal silhouette is grossly stable. Confluent opacities in the right upper lobe and right perihilar region have worsened. Patchy opacities with internal lucencies in the right lower lobe have also worsened. There is a small right pleural effusion, increased in size. There is no definite right pneumothorax. The left lung is clear. There is no left pleural effusion or pneumothorax. There is no acute osseous abnormality. IMPRESSION: Overall worsened aeration of the right lung with increased opacities in the right upper and lower lung and perihilar region, and increased size of a right pleural effusion. Given findings on the CT chest from 07/27/2021, findings are concerning for worsened pneumonia with possible areas of cavitation. Electronically Signed   By: Valetta Mole M.D.   On: 08/14/2021 09:19   DG Chest Portable 1 View  Result Date: 07/27/2021 CLINICAL DATA:  Recent diagnosis of pneumonia with persistent dyspnea and weakness and cough EXAM: PORTABLE CHEST 1 VIEW COMPARISON:  07/21/2021 chest radiograph. FINDINGS: Stable cardiomediastinal silhouette with normal heart size. No pneumothorax. No pleural effusion. Hyperinflated lungs. Emphysema. Worsening patchy consolidation in the upper right lung. Clear left lung. IMPRESSION: 1. Worsening patchy consolidation in the upper right lung. While potentially due to worsening pneumonia, an underlying central upper right lung mass is not excluded. Suggest further evaluation with chest CT with IV contrast. 2. Hyperinflated lungs and emphysema, suggesting COPD. Electronically Signed   By: Ilona Sorrel M.D.   On: 07/27/2021 12:32   DG Swallowing Func-Speech Pathology  Result Date: 07/28/2021 Table formatting from the original result was not included.  Objective Swallowing Evaluation: Type of Study: MBS-Modified Barium Swallow Study  Patient Details Name: LARELL BANEY MRN: [id number] Date of Birth: 1934/11/28 Today's Date: 07/28/2021 Time: SLP Start Time (ACUTE ONLY): 93 -SLP Stop Time (ACUTE ONLY): 1505 SLP Time Calculation (min) (ACUTE ONLY): 30 min Past Medical History: Past Medical History: Diagnosis Date  Allergic rhinitis, cause unspecified   Arthritis   COPD (chronic obstructive pulmonary disease) (HCC)   Dyspnea   Dysrhythmia   "1 episode of tachycardia in 1980's, been on it ever since"  Emphysema of lung ([place])   Hypertension   Left inguinal hernia 02/07/2019  Low hemoglobin   low level  Neuromuscular disorder ([place])   Neuropathy   feet  Pneumonia   History of, last Oct 2019  Pulmonary nodule  Past Surgical History: Past Surgical History: Procedure Laterality Date  [place]  2003  performed at [facility], Dr. Fransico Him  CATARACT EXTRACTION  2008  Dr. Darleen Crocker  CATARACT EXTRACTION, BILATERAL    EYE SURGERY    INGUINAL HERNIA REPAIR Left 02/07/2019  Procedure: OPEN REPAIR LEFT INGUINAL HERNIA WITH MESH;  Surgeon: Fanny Skates, MD;  Location: [facility];  Service: General;  Laterality: Left;  SPINAL AND TAP BLOCK ANESTHESIA  ROTATOR CUFF REPAIR  2002  Dr. Kathryne Hitch  TONSILLECTOMY  1945  VASECTOMY   HPI: Pt is a n 87 yo male adm to hospital - with respiratory issues - diagnosed with pna.  Pt reports being recently treated with ABX, steroid approx 4 weeks ago and more recently by Dr. Keturah Barre.  Large airspace opacity is noted in right upper and lower lobes concerning for pneumonia, small sliding hiatal hernia  No prior swallow evals.  Pt admits to having problems with "feeling full" pointing to proximal esophagus and having "gas".  He denies dysphagia.  Pt failed [facility] swallow screen and MBS ordered.  Subjective: pt awake in chair  Recommendations for follow up therapy are one component of a multi-disciplinary discharge  planning process, led by the attending physician.  Recommendations may be updated based on patient status, additional functional criteria and insurance authorization. Assessment / Plan / Recommendation Clinical Impressions 07/28/2021 Clinical Impression Patient presents with mild dysphagia with suspected oral compensation due to his COPD c/b lingual pumping, prolonged oral transiting with lingual pumping with liquids. Oral hold may aid voluntary swallow control and increases time for breathing, thus may improve breathing and increase post-swallow exhalation.  Pharyngeal swallow is overall strong and timely with initial laryngeal penetration of thin via cup *with first swallow of the study*.   Aspiration of lateral channels secretion and barium retention observed (suspect of nectar liquids pt previously had swallowed) as retention spilled into open larynx while pudding barium was at vallecular space.  This did not recur across all further boluses.  Suspect pt has level of chronic low grade aspiration for which mitigation strategies indicated. In addition, pt appears with prominent CP/UES - developing CP bar causing SLP to suspect excessive pressure in the esophagus.  Upon esophageal sweep, pt appeared with barium lining his esophagus.  Recommend pt follow strict esophageal/reflux precautions.  In addition, pt required frequent rest breaks during exam  and thus swallow/respiratory dysynchrony may allow episodic  aspiration.  Using teach back, educated son, Liliane Channel, to findings/recommendations using video loops. Pt had been taken upstairs.  Will follow up briefly and  consider RMST if pt will participate. SLP Visit Diagnosis Dysphagia, unspecified (R13.10);Dysphagia, oral phase (R13.11) Attention and concentration deficit following -- Frontal lobe and executive function deficit following -- Impact on safety and function Mild aspiration risk   Treatment Recommendations 07/28/2021 Treatment Recommendations Therapy as  outlined in treatment plan below   Prognosis 07/28/2021 Prognosis for Safe Diet Advancement Fair Barriers to Reach Goals Time post onset Barriers/Prognosis Comment -- Diet Recommendations 07/28/2021 SLP Diet Recommendations Dysphagia 3 (Mech soft) solids;Thin liquid Liquid Administration via Cup Medication Administration Other (Comment) Compensations Slow rate;Small sips/bites Postural Changes Remain semi-upright after after feeds/meals (Comment);Seated upright at 90 degrees   Other Recommendations 07/28/2021 Recommended Consults -- Oral Care Recommendations Oral care QID Other Recommendations Have oral suction available Follow Up Recommendations -- Assistance recommended at discharge Intermittent Supervision/Assistance Functional Status Assessment Patient has had a recent decline in their functional status and demonstrates the ability to make significant improvements in function in a reasonable and predictable amount of time. Frequency and Duration  07/28/2021 Speech Therapy Frequency (ACUTE ONLY) min 1 x/week  Treatment Duration 2 weeks   Oral Phase 07/28/2021 Oral Phase Impaired Oral - Pudding Teaspoon -- Oral - Pudding Cup -- Oral - Honey Teaspoon -- Oral - Honey Cup -- Oral - Nectar Teaspoon -- Oral - Nectar Cup Lingual pumping;Delayed oral transit;Premature spillage Oral - Nectar Straw -- Oral - Thin Teaspoon -- Oral - Thin Cup Delayed oral transit;Lingual pumping Oral - Thin Straw Lingual pumping;Delayed oral transit Oral - Puree Lingual pumping;Delayed oral transit Oral - Mech Soft Lingual pumping;Delayed oral transit Oral - Regular -- Oral - Multi-Consistency -- Oral - Pill Lingual pumping;Reduced posterior propulsion Oral Phase - Comment Pt  Pharyngeal Phase 07/28/2021 Pharyngeal Phase Impaired Pharyngeal- Pudding Teaspoon -- Pharyngeal -- Pharyngeal- Pudding Cup -- Pharyngeal -- Pharyngeal- Honey Teaspoon -- Pharyngeal -- Pharyngeal- Honey Cup -- Pharyngeal -- Pharyngeal- Nectar Teaspoon -- Pharyngeal --  Pharyngeal- Nectar Cup [facility] Pharyngeal Material does not enter airway Pharyngeal- Nectar Straw -- Pharyngeal -- Pharyngeal- Thin Teaspoon -- Pharyngeal -- Pharyngeal- Thin Cup WFL;Penetration/Aspiration during swallow Pharyngeal Material does not enter airway Pharyngeal- Thin Straw WFL Pharyngeal Material does not enter airway Pharyngeal- Puree WFL Pharyngeal Material does not enter airway Pharyngeal- Mechanical Soft WFL Pharyngeal Material does not enter airway Pharyngeal- Regular -- Pharyngeal -- Pharyngeal- Multi-consistency -- Pharyngeal -- Pharyngeal- Pill Reduced epiglottic inversion;Pharyngeal residue - valleculae Pharyngeal Material does not enter airway Pharyngeal Comment barium tablet stalled at vallecular region when taken with thin with pt awareness, pudding transited it into cervical esophagus - where it appeared to halt above UES - more liquids facilitated clearance into esophagus, prolonged oral transiting is self created compensations strategy likely due to pt's COPD  Cervical Esophageal Phase  07/28/2021 Cervical Esophageal Phase Impaired Pudding Teaspoon -- Pudding Cup -- Honey Teaspoon -- Honey Cup -- Nectar Teaspoon -- Nectar Cup Prominent cricopharyngeal segment Nectar Straw -- Thin Teaspoon -- Thin Cup Prominent cricopharyngeal segment Thin Straw Prominent cricopharyngeal segment Puree Prominent cricopharyngeal segment Mechanical Soft Prominent cricopharyngeal segment Regular Reduced cricopharyngeal relaxation;Prominent cricopharyngeal segment Multi-consistency -- Pill -- Cervical Esophageal Comment -- Kathleen Lime, MS [facility] SLP Acute Rehab Services Office [phone] Pager [phone] Macario Golds 07/28/2021, 4:21 PM                     ECHOCARDIOGRAM COMPLETE  Result Date: 07/28/2021    ECHOCARDIOGRAM REPORT   Patient Name:   NAAMAN CURRO Date of Exam: 07/28/2021 Medical Rec #:  [id number]        Height:       69.0 in Accession #:    [id number]       Weight:       133.5 lb Date of  Birth:  [birth year]        BSA:          1.740 m Patient Age:    87 years         BP:           155/81 mmHg Patient Gender: M                HR:           93 bpm. Exam Location:  Inpatient Procedure: 2D Echo, Cardiac Doppler and Color Doppler Indications:    Pulmonary Emblous  History:        Patient has prior history of Echocardiogram examinations, most                 recent 09/28/2019. CAD, COPD; Risk Factors:Hypertension.  Sonographer:  Glo Herring Referring Phys: [id number] Eugenie Filler  Sonographer Comments: No apical window. Limited echo due to poor acoustic windows IMPRESSIONS  1. Left ventricular ejection fraction, by estimation, is 60 to 65%. The left ventricle has normal function. The left ventricle has no regional wall motion abnormalities. Left ventricular diastolic function could not be evaluated.  2. Right ventricular systolic function is normal. The right ventricular size is moderately enlarged. There is mildly elevated pulmonary artery systolic pressure.  3. Right atrial size was mildly dilated.  4. The mitral valve is grossly normal. No evidence of mitral valve regurgitation.  5. Tricuspid valve regurgitation is mild to moderate.  6. The aortic valve is tricuspid. Aortic valve regurgitation is not visualized. No aortic stenosis is present.  7. The inferior vena cava is normal in size with greater than 50% respiratory variability, suggesting right atrial pressure of 3 mmHg. Comparison(s): No significant change from prior study. Prior images reviewed side by side. FINDINGS  Left Ventricle: Left ventricular ejection fraction, by estimation, is 60 to 65%. The left ventricle has normal function. The left ventricle has no regional wall motion abnormalities. The left ventricular internal cavity size was normal in size. There is  no left ventricular hypertrophy. Left ventricular diastolic function could not be evaluated. Right Ventricle: The right ventricular size is moderately enlarged. No increase in  right ventricular wall thickness. Right ventricular systolic function is normal. There is mildly elevated pulmonary artery systolic pressure. The tricuspid regurgitant velocity is 3.12 m/s, and with an assumed right atrial pressure of 3 mmHg, the estimated right ventricular systolic pressure is 89.3 mmHg. Left Atrium: Left atrial size was normal in size. Right Atrium: Right atrial size was mildly dilated. Pericardium: There is no evidence of pericardial effusion. Mitral Valve: The mitral valve is grossly normal. No evidence of mitral valve regurgitation. Tricuspid Valve: The tricuspid valve is normal in structure. Tricuspid valve regurgitation is mild to moderate. Aortic Valve: The aortic valve is tricuspid. Aortic valve regurgitation is not visualized. No aortic stenosis is present. Pulmonic Valve: The pulmonic valve was not well visualized. Pulmonic valve regurgitation is not visualized. Aorta: The aortic root was not well visualized. Venous: The inferior vena cava is normal in size with greater than 50% respiratory variability, suggesting right atrial pressure of 3 mmHg. IAS/Shunts: No atrial level shunt detected by color flow Doppler.  LEFT VENTRICLE PLAX 2D LVIDd:         3.70 cm LVIDs:         2.20 cm LV PW:         0.90 cm LV IVS:        0.90 cm  LEFT ATRIUM         Index LA diam:    3.10 cm 1.78 cm/m  TRICUSPID VALVE TR Peak grad:   38.9 mmHg TR Vmax:        312.00 cm/s Sanda Klein MD Electronically signed by Sanda Klein MD Signature Date/Time: 07/28/2021/1:57:27 PM    Final    VAS [place] LOWER EXTREMITY VENOUS (DVT)  Result Date: 07/28/2021  Lower Venous DVT Study Patient Name:  MIKHAI BIENVENUE  Date of Exam:   07/28/2021 Medical Rec #: [id number]         Accession #:    [id number] Date of Birth: 04-28-1935         Patient Gender: M Patient Age:   28 years Exam Location:  [facility] Procedure:      VAS [place] LOWER EXTREMITY VENOUS (  DVT) Referring Phys: Irine Seal  --------------------------------------------------------------------------------  Indications: Pulmonary embolism.  Comparison Study: Previous exam (LLEV) on 05/19/2018 was negative for DVT. Performing Technologist: Rogelia Rohrer RVT, RDMS  Examination Guidelines: A complete evaluation includes B-mode imaging, spectral Doppler, color Doppler, and power Doppler as needed of all accessible portions of each vessel. Bilateral testing is considered an integral part of a complete examination. Limited examinations for reoccurring indications may be performed as noted. The reflux portion of the exam is performed with the patient in reverse Trendelenburg.  +---------+---------------+---------+-----------+----------+--------------+  RIGHT     Compressibility Phasicity Spontaneity Properties Thrombus Aging  +---------+---------------+---------+-----------+----------+--------------+  CFV       Full            Yes       Yes                                    +---------+---------------+---------+-----------+----------+--------------+  SFJ       Full                                                             +---------+---------------+---------+-----------+----------+--------------+  FV Prox   Full            Yes       Yes                                    +---------+---------------+---------+-----------+----------+--------------+  FV Mid    Full            Yes       Yes                                    +---------+---------------+---------+-----------+----------+--------------+  FV Distal Full            Yes       Yes                                    +---------+---------------+---------+-----------+----------+--------------+  PFV       Full                                                             +---------+---------------+---------+-----------+----------+--------------+  POP       Full            Yes       Yes                                    +---------+---------------+---------+-----------+----------+--------------+  PTV        Full                                                             +---------+---------------+---------+-----------+----------+--------------+  PERO      Full                                                             +---------+---------------+---------+-----------+----------+--------------+   +---------+---------------+---------+-----------+----------+--------------+  LEFT      Compressibility Phasicity Spontaneity Properties Thrombus Aging  +---------+---------------+---------+-----------+----------+--------------+  CFV       Full            Yes       Yes                                    +---------+---------------+---------+-----------+----------+--------------+  SFJ       Full                                                             +---------+---------------+---------+-----------+----------+--------------+  FV Prox   Full            Yes       Yes                                    +---------+---------------+---------+-----------+----------+--------------+  FV Mid    Full            Yes       Yes                                    +---------+---------------+---------+-----------+----------+--------------+  FV Distal Full            Yes       Yes                                    +---------+---------------+---------+-----------+----------+--------------+  PFV       Full                                                             +---------+---------------+---------+-----------+----------+--------------+  POP       Full            Yes       Yes                                    +---------+---------------+---------+-----------+----------+--------------+  PTV       Full                                                             +---------+---------------+---------+-----------+----------+--------------+  PERO      Full                                                             +---------+---------------+---------+-----------+----------+--------------+     Summary: BILATERAL: - No evidence of deep vein  thrombosis seen in the lower extremities, bilaterally. - No evidence of superficial venous thrombosis in the lower extremities, bilaterally. -No evidence of popliteal cyst, bilaterally.   *See table(s) above for measurements and observations. Electronically signed by Deitra Mayo MD on 07/28/2021 at 1:56:09 PM.    Final     Right chest tube insertion 01/02 Dr. Vaughan Browner.  Subjective: Breathing is better. PT said he did very well with her, pt reports he got up walked across room and back, has been in chair. No chest pain reported. He is reluctant to leave the hospital, not because he's not feeling better, but because he wishes to gain more strength prior to participating in rehab in earnest. We discussed that the reason for rehab is to regain strength, which is not the strength of inpatient management.  Discharge Exam: Vitals:   08/21/21 0814 08/21/21 0818  BP: 110/62   Pulse: 86   Resp: 11   Temp: 98.4 F (36.9 C)   SpO2: 100% 100%   General: Pleasant male in chair in no distress Cardiovascular: Irreg irreg, trace dependent bilateral edema, no JVD Respiratory: Nonlabored, normal rate, improved aeration, still decreased at right base. No wheezes.  Abdominal: Soft, NT, ND, bowel sounds +  Labs: BNP (last 3 results) Recent Labs    07/27/21 1213 08/14/21 0915  BNP 215.3* 616.0*   Basic Metabolic Panel: Recent Labs  Lab 08/15/21 0306 08/16/21 0138 08/17/21 0106 08/18/21 0125 08/20/21 0151  NA 134* 135 135 138 136  K 3.9 3.9 3.7 4.3 3.7  CL 100 96* 97* 98 99  CO2 26 33* 31 32 33*  GLUCOSE 145* 170* 165* 177* 126*  BUN 18 21 27* 33* 32*  CREATININE 0.46* 0.52* 0.65 0.55* 0.42*  CALCIUM 8.1* 8.0* 8.2* 8.5* 8.2*  MG  --  2.1 2.1 2.1  --    Liver Function Tests: Recent Labs  Lab 08/15/21 0306 08/16/21 0138  AST 12* 14*  ALT 13 14  ALKPHOS 45 47  BILITOT 0.6 0.6  PROT 5.1* 4.8*  ALBUMIN 2.1* 1.9*   No results for input(s): LIPASE, AMYLASE in the last 168  hours. No results for input(s): AMMONIA in the last 168 hours. CBC: Recent Labs  Lab 08/15/21 0306 08/16/21 0138 08/17/21 0106 08/18/21 0125  WBC 14.5* 14.8* 15.4* 13.4*  NEUTROABS  --   --  14.7* 12.9*  HGB 9.9* 8.7* 9.4* 9.2*  HCT 31.4* 28.2* 29.9* 29.6*  MCV 89.5 89.5 88.7 89.7  PLT 211 248 250 266   Cardiac Enzymes: No results for input(s): CKTOTAL, CKMB, CKMBINDEX, TROPONINI in the last 168 hours. BNP: Invalid input(s): POCBNP CBG: No results for input(s): GLUCAP in the last 168 hours. D-Dimer No results for input(s): DDIMER in the last 72 hours. Hgb A1c No results for input(s): HGBA1C in the last 72 hours. Lipid Profile No results for input(s): CHOL, HDL, LDLCALC, TRIG, CHOLHDL, LDLDIRECT in the last 72 hours. Thyroid function studies No results for input(s): TSH, T4TOTAL, T3FREE, THYROIDAB in the last 72 hours.  Invalid input(s): FREET3 Anemia work up No results for input(s): VITAMINB12, FOLATE, FERRITIN, TIBC, IRON, RETICCTPCT in the last 72 hours. Urinalysis    Component Value Date/Time   COLORURINE YELLOW 08/14/2021 1113   APPEARANCEUR HAZY (A) 08/14/2021 1113   LABSPEC 1.023 08/14/2021 1113   PHURINE 5.0 08/14/2021 1113   GLUCOSEU NEGATIVE 08/14/2021 1113   HGBUR MODERATE (A) 08/14/2021 1113   BILIRUBINUR NEGATIVE 08/14/2021 [place] 08/14/2021 1113   PROTEINUR 30 (A) 08/14/2021 1113   NITRITE NEGATIVE 08/14/2021 1113   [place] 08/14/2021 1113    Microbiology Recent Results (from the past 240 hour(s))  Resp Panel by RT-PCR (Flu A&B, Covid) Nasopharyngeal Swab     Status: None   Collection Time: 08/14/21  8:59 AM   Specimen: Nasopharyngeal Swab; Nasopharyngeal(NP) swabs in vial transport medium  Result Value Ref Range Status   SARS Coronavirus 2 by RT PCR NEGATIVE NEGATIVE Final    Comment: (NOTE) SARS-CoV-2 target nucleic acids are NOT DETECTED.  The SARS-CoV-2 RNA is generally detectable in upper  respiratory specimens during the acute phase of infection. The lowest concentration of SARS-CoV-2 viral copies this assay can detect is 138 copies/mL. A negative result does not preclude SARS-Cov-2 infection and should not be used as the sole basis for treatment or other patient management decisions. A negative result may occur with  improper specimen collection/handling, submission of specimen other than nasopharyngeal swab, presence of viral mutation(s) within the areas targeted by this assay, and inadequate number of viral copies(<138 copies/mL). A negative result must be combined with clinical observations, patient history, and epidemiological information. The expected result is Negative.  Fact Sheet for Patients:  EntrepreneurPulse.com.au  Fact Sheet for Healthcare Providers:  IncredibleEmployment.be  This test is no t yet approved or cleared by the [place] FDA and  has been authorized for detection and/or diagnosis of SARS-CoV-2 by FDA under an Emergency Use Authorization (EUA). This EUA will remain  in effect (meaning this test can be used) for the duration of the COVID-19 declaration under Section 564(b)(1) of the Act, 21 U.S.C.section 360bbb-3(b)(1), unless the authorization is terminated  or revoked sooner.       Influenza A by PCR NEGATIVE NEGATIVE Final   Influenza B by PCR NEGATIVE NEGATIVE Final    Comment: (NOTE) The Xpert Xpress SARS-CoV-2/FLU/RSV plus assay is intended as an aid in the diagnosis of influenza from Nasopharyngeal swab specimens and should not be used as a sole basis for treatment. Nasal washings and aspirates are unacceptable for Xpert Xpress SARS-CoV-2/FLU/RSV testing.  Fact Sheet for Patients: EntrepreneurPulse.com.au  Fact Sheet for Healthcare Providers: IncredibleEmployment.be  This test is not yet approved or cleared by the [place] FDA and has been  authorized for detection and/or diagnosis of SARS-CoV-2 by FDA under an Emergency Use Authorization (EUA). This EUA will remain in effect (meaning this test can be used) for the duration of the COVID-19 declaration under Section 564(b)(1) of the Act, 21 U.S.C. section 360bbb-3(b)(1), unless the authorization is terminated or revoked.  Performed at [facility], [place] [address] [place], [place] [address]   Blood Culture (routine x 2)     Status: None   Collection Time: 08/14/21 11:14 AM   Specimen: Right Antecubital; Blood  Result Value Ref Range Status   Specimen Description RIGHT ANTECUBITAL  Final   Special Requests   Final    BOTTLES DRAWN AEROBIC AND ANAEROBIC Blood Culture adequate volume   Culture   Final  NO GROWTH 5 DAYS Performed at [facility], [place] [address] [place], [place] [address]    Report Status 08/19/2021 FINAL  Final  Urine Culture     Status: None   Collection Time: 08/14/21 11:18 AM   Specimen: In/Out Cath Urine  Result Value Ref Range Status   Specimen Description IN/OUT CATH URINE  Final   Special Requests NONE  Final   Culture   Final    NO GROWTH Performed at [facility], [place] [address], [place], [place] [address]    Report Status 08/15/2021 FINAL  Final  Blood Culture (routine x 2)     Status: None   Collection Time: 08/14/21 11:25 AM   Specimen: BLOOD LEFT ARM  Result Value Ref Range Status   Specimen Description BLOOD LEFT ARM  Final   Special Requests   Final    BOTTLES DRAWN AEROBIC AND ANAEROBIC Blood Culture adequate volume   Culture   Final    NO GROWTH 5 DAYS Performed at [facility], [place] [address], [place], [place] [address]    Report Status 08/19/2021 FINAL  Final  MRSA Next Gen by PCR, Nasal     Status: None   Collection Time: 08/14/21 11:43 AM   Specimen: Nasal Mucosa; Nasal Swab  Result Value Ref Range Status   MRSA by PCR Next Gen NOT DETECTED NOT DETECTED Final    Comment: (NOTE) The GeneXpert  MRSA Assay (FDA approved for NASAL specimens only), is one component of a comprehensive MRSA colonization surveillance program. It is not intended to diagnose MRSA infection nor to guide or monitor treatment for MRSA infections. Test performance is not FDA approved in patients less than 49 years old. Performed at [facility], [place] [address], [place], [place] [address]     Time coordinating discharge: Approximately 40 minutes  Patrecia Pour, MD  [facility] 08/21/2021, 12:50 PM

## 2021-08-21 NOTE — Progress Notes (Signed)
Occupational Therapy Treatment Patient Details Name: [name] MRN: [id number] DOB: [birth year] Today's Date: 08/21/2021   History of present illness The pt is an 87 yo male presenting 12/30 with SOB and tachycardia. Upon work up, pt with increased size of R pleural effusion and chest x-ray concerning for pneumonia. Cardiology consulted for new onset afib in setting of COPD exacerbation. PMH includes: COPD on 3L at rest and 4-6L O2 with exertion, CAD, HTN, HLD, and BPH.   OT comments  Pt demonstrated ability to perform bed mobility with supervision, ambulate to sink and bathroom with RW and min guard assist and completed seated grooming on BSC at sink with set up. 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 Pt is progressing well, but continues to need rehab in SNF prior to returning home.    Recommendations for follow up therapy are one component of a multi-disciplinary discharge planning process, led by the attending physician.  Recommendations may be updated based on patient status, additional functional criteria and insurance authorization.    Follow Up Recommendations  Skilled nursing-short term rehab (<3 hours/day)    Assistance Recommended at Discharge Frequent or constant Supervision/Assistance  Patient can return home with the following  A little help with walking and/or transfers;A little help with bathing/dressing/bathroom;Assistance with cooking/housework;Assist for transportation;Help with stairs or ramp for entrance   Equipment Recommendations  BSC/3in1    Recommendations for Other Services      Precautions / Restrictions Precautions Precautions: Fall Precaution Comments: watch HR Restrictions Weight Bearing Restrictions: No       Mobility Bed Mobility Overal bed mobility: Needs Assistance Bed  Mobility: Supine to Sit     Supine to sit: Supervision          Transfers Overall transfer level: Needs assistance Equipment used: Rolling walker (2 wheels) Transfers: Sit to/from Stand Sit to Stand: Min guard           General transfer comment: from elevated surface, cues for hand placement     Balance Overall balance assessment: Needs assistance   Sitting balance-Leahy Scale: Good     Standing balance support: Bilateral upper extremity supported;Reliant on assistive device for balance;Single extremity supported Standing balance-Leahy Scale: Poor                             ADL either performed or assessed with clinical judgement   ADL Overall ADL's : Needs assistance/impaired     Grooming: Oral care;Sitting;Set up           Upper Body Dressing : Minimal assistance;Sitting Upper Body Dressing Details (indicate cue type and reason): front opening gown     Toilet Transfer: Min guard;Ambulation;BSC/3in1           Functional mobility during ADLs: Min guard;Rolling walker (2 wheels)      Extremity/Trunk Assessment              Vision       Perception     Praxis      Cognition Arousal/Alertness: Awake/alert Behavior During Therapy: Anxious Overall Cognitive Status: Impaired/Different from baseline Area of Impairment: Problem solving  Problem Solving: Slow processing General Comments: Pt with increased HR when talking about post acute rehab, decreases when distracted when talking about work.          Exercises     Shoulder Instructions       General Comments      Pertinent Vitals/ Pain       Pain Assessment: No/denies pain  Home Living                                          Prior Functioning/Environment              Frequency  Min 2X/week        Progress Toward Goals  OT Goals(current goals can now be found in the care plan section)   Progress towards OT goals: Progressing toward goals  Acute Rehab OT Goals OT Goal Formulation: With patient Time For Goal Achievement: 08/29/21 Potential to Achieve Goals: [place] Discharge plan remains appropriate    Co-evaluation                 AM-PAC OT "6 Clicks" Daily Activity     Outcome Measure   Help from another person eating meals?: None Help from another person taking care of personal grooming?: A Little Help from another person toileting, which includes using toliet, bedpan, or urinal?: A Little Help from another person bathing (including washing, rinsing, drying)?: A Little Help from another person to put on and taking off regular upper body clothing?: A Little Help from another person to put on and taking off regular lower body clothing?: A Little 6 Click Score: 19    End of Session Equipment Utilized During Treatment: Rolling walker (2 wheels);Gait belt;Oxygen (3L)  OT Visit Diagnosis: Muscle weakness (generalized) (M62.81)   Activity Tolerance Patient tolerated treatment well   Patient Left in chair;with bed alarm set;with family/visitor present   Nurse Communication          Time: 8177-1165 OT Time Calculation (min): 21 min  Charges: OT General Charges $OT Visit: 1 Visit OT Treatments $Self Care/Home Management : 8-22 mins  Nestor Lewandowsky, OTR/L Acute Rehabilitation Services Pager: [phone] Office: [phone]  Malka So 08/21/2021, 11:36 AM

## 2021-08-21 NOTE — Progress Notes (Signed)
Physical Therapy Treatment Patient Details Name: [name] MRN: [id number] DOB: 1935/05/29 Today's Date: 08/21/2021   History of Present Illness Pt is an 87 y.o. male presenting 08/14/21 with SOB and tachycardia. Upon work up, pt with increased size of R pleural effusion and CXR concerning for PNA; s/p chest tube placement. Also with new onset afib in setting of COPD exacerbation. PMH includes COPD on 3L at rest and 4-6L O2 with exertion, CAD, HTN, HLD, BPH.   PT Comments    Pt progressing with mobility. Today's session focused on improving activity tolerance; pt appears anxious with activity (HR up to 129) requiring frequent encouragement and cues for breathing, activity pacing. Pt remains limited by generalized weakness, decreased activity tolerance, and impaired balance strategies/postural reactions. Continue to recommend SNF-level therapies to maximize functional mobility and independence prior to return home.    Recommendations for follow up therapy are one component of a multi-disciplinary discharge planning process, led by the attending physician.  Recommendations may be updated based on patient status, additional functional criteria and insurance authorization.  Follow Up Recommendations  Skilled nursing-short term rehab (<3 hours/day)     Assistance Recommended at Discharge Frequent or constant Supervision/Assistance  Patient can return home with the following Assistance with cooking/housework;Assist for transportation;Help with stairs or ramp for entrance;A little help with walking and/or transfers;A little help with bathing/dressing/bathroom   Equipment Recommendations  None recommended by PT    Recommendations for Other Services       Precautions / Restrictions Precautions Precautions: Fall;Other (comment) Precaution Comments: Watch HR Restrictions Weight Bearing Restrictions: No     Mobility  Bed Mobility Overal bed mobility: Needs Assistance Bed Mobility:  Supine to Sit     Supine to sit: Supervision;HOB elevated     General bed mobility comments: Use of bed rail    Transfers Overall transfer level: Needs assistance Equipment used: Rolling walker (2 wheels) Transfers: Sit to/from Stand Sit to Stand: Min guard           General transfer comment: Multiple sit<>stands from EOB and BSC (over toilet) with min guard for balance; initial cues for hand placement with good carryover to subsequent trials    Ambulation/Gait Ambulation/Gait assistance: Min guard Gait Distance (Feet): 10 Feet (+ 12' + 22') Assistive device: Rolling walker (2 wheels) Gait Pattern/deviations: Step-through pattern;Decreased stride length;Trunk flexed Gait velocity: Decreased     General Gait Details: Slow ambulation with RW and min guard for balance; 2x seated rest break, pt seems anxious related to SOB, but ambulation distance improved when seemingly distracted in conversation; cues for activity pacing and breathing strategies   Stairs             Wheelchair Mobility    Modified Rankin (Stroke Patients Only)       Balance Overall balance assessment: Needs assistance Sitting-balance support: Feet supported;No upper extremity supported Sitting balance-Leahy Scale: Good     Standing balance support: Bilateral upper extremity supported;Reliant on assistive device for balance;Single extremity supported Standing balance-Leahy Scale: Poor                              Cognition Arousal/Alertness: Awake/alert Behavior During Therapy: WFL for tasks assessed/performed;Anxious Overall Cognitive Status: Impaired/Different from baseline Area of Impairment: Problem solving                             Problem Solving: Slow processing;Requires  verbal cues General Comments: Pt with increased HR when talking about post acute rehab, decreases when distracted when talking about work. Son reports pt is OCD baseline         Exercises      General Comments General comments (skin integrity, edema, etc.): HR up to 120s with activity and certain conversation topics. Pulse ox probe switched to ear, reading 100% on 4L O2, >/89% on 3L O2 with activity. Pt's son present      Pertinent Vitals/Pain Pain Assessment: No/denies pain Pain Intervention(s): Monitored during session    Home Living                          Prior Function            PT Goals (current goals can now be found in the care plan section) Progress towards PT goals: Progressing toward goals    Frequency    Min 2X/week      PT Plan Frequency needs to be updated    Co-evaluation              AM-PAC PT "6 Clicks" Mobility   Outcome Measure  Help needed turning from your back to your side while in a flat bed without using bedrails?: A Little Help needed moving from lying on your back to sitting on the side of a flat bed without using bedrails?: A Little Help needed moving to and from a bed to a chair (including a wheelchair)?: A Little   Help needed to walk in hospital room?: Total Help needed climbing 3-5 steps with a railing? : Total 6 Click Score: 11    End of Session Equipment Utilized During Treatment: Oxygen;Gait belt Activity Tolerance: Patient tolerated treatment well Patient left: in chair;with call bell/phone within reach;with family/visitor present Nurse Communication: Mobility status PT Visit Diagnosis: Unsteadiness on feet (R26.81);Other abnormalities of gait and mobility (R26.89);Muscle weakness (generalized) (M62.81)     Time: 7342-8768 PT Time Calculation (min) (ACUTE ONLY): 19 min  Charges:  $Therapeutic Activity: 8-22 mins                     Mabeline Caras, PT, DPT Acute Rehabilitation Services  Pager [phone] Office [phone]  Derry Lory 08/21/2021, 2:57 PM

## 2021-08-21 NOTE — Progress Notes (Signed)
° °  NAME:  [name], MRN:  [id number], DOB:  September 20, 1934, LOS: 7 ADMISSION DATE:  08/14/2021, CONSULTATION DATE:  08/15/2021 REFERRING MD:  Laverna Peace MD, CHIEF COMPLAINT:   Pneumothorax  History of Present Illness:  87 year old with very severe COPD (FEV1 23% in 2012), coronary artery disease, hyperlipidemia with recent admission, discharged on 12/5 with pneumonia, COPD exacerbation, PE.  Readmitted with worsening dyspnea with HAP, atrial fibrillation with RVR.  He had a CT scan today which showed a new pneumothorax with worsening right lower lobe consolidation and PCCM consulted for evaluation and help with management  Pertinent  Medical History  Former Smoker  Emphysema COPD  Pulmonary Nodules HTN  Left Inguinal Hernia  Neuropathy   Significant Hospital Events: Including procedures, antibiotic start and stop dates in addition to other pertinent events   12/30 Admit  with SOB in setting of PNA, AFwRVR.  Pt comfortable, able to speak full sentences. Mild cough.  08/17/2021 chest tube placement right side per Marshell Garfinkel MD 1/5 Chest tube removed  Interim History / Subjective:   Doing well after chest tube removal.  Sitting up in chair.  States that breathing is doing  Objective   Blood pressure 110/62, pulse 86, temperature 98.4 F (36.9 C), temperature source Oral, resp. rate 11, height 5\' 9"  (1.753 m), weight 56.4 kg, SpO2 100 %.        Intake/Output Summary (Last 24 hours) at 08/21/2021 1218 Last data filed at 08/21/2021 0815 Gross per 24 hour  Intake 1200 ml  Output 250 ml  Net 950 ml   Filed Weights   08/19/21 0500 08/20/21 0318 08/21/21 0509  Weight: 56.7 kg 57 kg 56.4 kg    Examination: Gen:      No acute distress, frail HEENT:  EOMI, sclera anicteric Neck:     No masses; no thyromegaly Lungs:    Diminished air entry.  No wheeze CV:         Regular rate and rhythm; no murmurs Abd:      + bowel sounds; soft, non-tender; no palpable masses, no  distension Ext:    No edema; adequate peripheral perfusion Skin:      Warm and dry; no rash Neuro: Awake, oriented  X-ray with x-ray reviewed with persistent right lung opacities.  No pneumothorax after chest tube removal  Assessment & Plan:   Recurrent pneumonia, hospital-acquired pneumonia Secondary spontaneous right pneumothorax in the setting of very severe COPD, recent PE, and pneumonia Acute on Chronic Diastolic Dysfunction  Recent PE on Eliquis  AF/Flutter No pneumothorax after chest tube removal He is on baseline oxygen Off steroids Continue bronchodilators and antimicrobial therapy  PCCM will sign off.  We will arrange clinic follow-up with Dr. Annamaria Boots Please call with any questions  Best Practice (right click and "Reselect all SmartList Selections" daily)  Per primary team  Critical care time: NA   Marshell Garfinkel MD [place] [facility] See Amion for pager  If no response to pager , please call [phone] until 7pm After 7:00 pm call Elink  [phone] 08/21/2021, 12:24 PM

## 2021-08-24 ENCOUNTER — Telehealth: Payer: Self-pay | Admitting: Internal Medicine

## 2021-08-24 NOTE — Telephone Encounter (Signed)
Spoke with the pt and notified of response per Dr Annamaria Boots. I have scheduled him for 09/07/21 at 11:30 am and advised please call sooner if needed for sooner appt.

## 2021-08-24 NOTE — Telephone Encounter (Signed)
Call made to patient, confirmed DOB. Patient states he was recently D/C and sent to rehab. He had an appt 1/12 that he cancelled stating he could not make the appt. I inquired as to whether he would like [place] to have the facility se tup transportation to get him here and he states that would be an extra cost. I offered a my chart video visit as a hospital F/U however patient insisted that we speak with CY first to get his recommendations. Patient states he can get his family to bring him here he just needs advanced notice.   CY please advise. Would you like this patient set up with a video visit or in office visit for Hosptial F/U. Also only wants to see you, you have an opening today and tomorrow. Nothing else for several weeks that I can see. Thanks

## 2021-08-24 NOTE — Telephone Encounter (Signed)
It might be good to let him come in about 2 weeks, using a held spot, to give things time to settle down. If he feels he needs to be seen sooner please let me know.

## 2021-08-24 NOTE — Telephone Encounter (Signed)
ATC LVMTCB x 1  

## 2021-08-24 NOTE — Telephone Encounter (Signed)
Called pt back and there was no answer- LMTCB 

## 2021-08-24 NOTE — Telephone Encounter (Signed)
Patient is returning a call. °

## 2021-08-25 ENCOUNTER — Other Ambulatory Visit: Payer: Self-pay | Admitting: *Deleted

## 2021-08-25 NOTE — Patient Outreach (Signed)
Per [place] eligible member resides in  [facility]. Made aware Mr. Fedora is active with [place].   Communication sent to facility SW to notify writer is following for [facility] needs and transition plans.  Will update [facility] RNCM accordingly.   Will continue to follow while member remains in SNF.      Marthenia Rolling, MSN, RN,BSN [place] Acute Care Coordinator [phone] [facility] [phone]  (Toll free office)

## 2021-08-26 ENCOUNTER — Other Ambulatory Visit: Payer: Self-pay | Admitting: Internal Medicine

## 2021-08-26 ENCOUNTER — Other Ambulatory Visit: Payer: Self-pay | Admitting: *Deleted

## 2021-08-26 ENCOUNTER — Ambulatory Visit: Payer: Medicare Other | Admitting: Internal Medicine

## 2021-08-26 NOTE — Patient Outreach (Signed)
[facility] Post- Acute Care Coordinator follow up. Per [place] eligible member resides in [facility]. Member active with [place] [facility] prior to admission.  Mr. Nylund admitted to SNF on 08/21/21 after a hospitalization.  Facility site visit to [facility]. Met with Marita Kansas, SNF SW to discuss writer is following for transition plans and [facility] Care Management program. Discussed [facility] palliative was following prior.  Spoke with Mr. Gosnell in room at [facility] briefly. His son came in to visit during bedside conversation. Discussed that Probation officer will follow for transition plans while Mr. Fedewa remains in SNF. Provided contact information along with [facility] Care Management brochure. Discussed writer will keep [place] [facility] RNCM updated.   Notification sent to [facility] palliative to make aware of member's admission to [facility].   Will continue to follow.    Marthenia Rolling, MSN, RN,BSN [place] Acute Care Coordinator [phone] [facility]) [phone]  (Toll free office)

## 2021-08-27 ENCOUNTER — Other Ambulatory Visit: Payer: Self-pay | Admitting: *Deleted

## 2021-08-27 ENCOUNTER — Ambulatory Visit: Payer: Medicare Other | Admitting: Internal Medicine

## 2021-08-27 NOTE — Patient Outreach (Signed)
[facility]) Care Management  08/27/2021  [name] [birth year] [id number]   Member's case discussed with multidisciplinary team due to readmission.  Member current admitted to SNF.  [facility]'s post acute care coordinator is following for transition plans.  [name], RN, MSN, [place] Manager [phone]

## 2021-09-02 ENCOUNTER — Other Ambulatory Visit: Payer: Self-pay | Admitting: *Deleted

## 2021-09-02 NOTE — Patient Outreach (Addendum)
[facility] Post- Acute Care Coordinator follow up. Per [place] eligible member resides in [facility]. Mr. Barris was active with [place] [facility] prior to admission.   Facility site visit to [facility] skilled nursing facility. Met with Marita Kansas, SNF SW concerning member's progress, transition plan, and potential [facility] needs. SNF SW reports anticipated transition plan is home with [facility] along with privately paid personal care sevices with [facility]. To transition home on Saturday, January 21st. Will have hospital bed and lift thru [employer]. Marita Kansas reports she has been speaking with son Shanon Brow regarding transition arrangements.  Went to bedside to speak with Mr. Bellew. He was sleeping soundly. Writer did not want to disturb. Telephone call made to Shanon Brow (son) [phone]. Patient identifiers confirmed. Discussed that Shanon Brow met Probation officer last week at [employer] at [facility]. Explained purpose of call regarding transition plans. Shanon Brow would not confirm any transition plans and arrangements.   Will continue to collaborate with SNF SW and follow transition plans/needs/date while member resides in SNF.   Notification of anticipated transition date and plans sent to [place].   Will notify [facility] palliative anticipated transition date as well.    Marthenia Rolling, MSN, RN,BSN [place] Acute Care Coordinator [phone] [facility]) [phone]  (Toll free office)

## 2021-09-04 ENCOUNTER — Telehealth: Payer: Self-pay | Admitting: Internal Medicine

## 2021-09-04 NOTE — Progress Notes (Signed)
Patient ID: [name], male    DOB: [birth year], 87 y.o.   MRN: [id number] PCP Dr Hulan Fess  HPI male former smoker followed for COPD/emphysema, pulmonary nodules, hypoxic respiratory failure, complicated by CAD B2WU 1/32/44-  MM PFT 04/27/11- Echocardiogram 07/31/2018-EF 55-60%, grade 1 DD, PHN 49 mmHg ------------------------------------------------------------------------------   07/21/21- 87 year old male former smoker followed for COPD/emphysema, pulmonary nodules, hypoxic respiratory failure, complicated by CAD, Anemia, Peripheral Edema,  O2 3 - 4L/[employer] -Breztri, Ventolin hfa, neb Duoneb,  Covid vax-5 Phizer Flu vax-had Body weight today-136 lbs -----SOB increased/ some cough and wheezing  Almost every time he comes, he says that shortness of breath is worse than ever.  This visit was no exception.  He had a brief interval 4 to 6 weeks ago when he was feeling well and began exercising.  He tried using his nebulizer machine and within a day or 2 was feeling worse with more shortness of breath and wheeze.  He cannot point to anything specific-no obvious infection or fluid retention and no acute event.  His primary physician gave an antibiotic and prednisone which did not help.  She then repeated a prednisone taper which stopped part way through, about a week ago, when it was not helping. CXR 02/18/21- IMPRESSION: 1. COPD. No acute infiltrate. Bilateral pleural-parenchymal thickening with consistent scarring. 2. Heart size stable. Prominent central pulmonary arteries suggesting the possibility of pulmonary hypertension. No interim change.  09/07/21- Virtual Visit via Video Note  I connected with [name] on 09/04/21 at 11:30 AM EST by a video enabled telemedicine application and verified that I am speaking with the correct person using two identifiers.  Location: Patient: home Provider: office   I discussed the limitations of evaluation and management by  telemedicine and the availability of in person appointments. The patient expressed understanding and agreed to proceed.  History of Present Illness:87 year old male former smoker followed for COPD/emphysema, pulmonary nodules, hypoxic respiratory failure, complicated by CAD, AFib/ Eliquis, Anemia, Peripheral Edema,  O2 3 - 6L/[facility] -Breztri,  neb Duoneb, Xopenex hfa, Covid vax-5 Phizer Flu vax-had Hgb 9.2 on 08/18/21   Observations/Objective: Hosp 12/30-08/21/21- recently hosp for pneumonia, AECOPD, PE, discharged 12/15.  Readmitted through ER 12/30 with hypoxia, pneumonia, AFib. Then R PTX chest tube eventually removed. To SNF for rehab/ palliative care.  Has been home now for 2 days.Home PT starts today and Home Nurse seeing for decubitus care. Has CNA 24/7 Alvis Lemmings), hospital bed, bedside commode. Has had end of life discussions.  CXR 08/21/21- FINDINGS: The heart size and mediastinal contours are within normal limits. Left lung is clear. Right-sided chest tube has been removed. No definite pneumothorax is noted. Stable right upper and lower lobe lung opacities are noted with probable small right pleural effusion. The visualized skeletal structures are unremarkable. IMPRESSION: No pneumothorax status post right-sided chest tube removal. Stable right lung findings as described above.  Assessment and Plan:   Follow Up Instructions:    I discussed the assessment and treatment plan with the patient. The patient was provided an opportunity to ask questions and all were answered. The patient agreed with the plan and demonstrated an understanding of the instructions.   The patient was advised to call back or seek an in-person evaluation if the symptoms worsen or if the condition fails to improve as anticipated.  I provided 20 minutes of non-face-to-face time during this encounter.   Baird Lyons, MD  -----------------  Review of Systems-see HPI   + =  positive Constitutional:   No-    weight loss, night sweats fevers, no-chills, fatigue, lassitude. HEENT:   No-  headaches, difficulty swallowing, tooth/dental problems, sore throat,       No-  sneezing, itching, ear ache, nasal congestion, post nasal drip,  CV:  No-chest pain, no-orthopnea, PND, +swelling in lower extremities, anasarca, dizziness, palpitations Resp: + shortness of breath with exertion, not at rest.               cough,  + non-productive cough,  No-  coughing up of blood.              No change color of mucus.  No- wheezing.   Skin: No-   rash or lesions. GI:  No-   heartburn, indigestion, abdominal pain, nausea,   GU: No-   dysuria,  MS:  No-   joint pain or swelling.  Neuro-  Psych:  No- change in mood or affect. No acute depression or anxiety.  No memory loss.  OBJ-    + looks frail    Wheelchair, O2 3L General- Alert, Oriented, Affect-appropriate, Distress- none acute. + O2 2, slender Skin-  Lymphadenopathy- none Head- atraumatic            Eyes- Gross vision intact, PERRLA, conjunctivae clear secretions            Ears- Hearing, canals-normal            Nose- Clear, no-Septal dev, mucus, polyps, erosion, perforation             Throat- Mallampati II , mucosa clear , drainage- none, tonsils- atrophic.  Neck- flexible , trachea midline, no stridor , thyroid nl, carotid no bruit Chest - symmetrical excursion , unlabored           Heart/CV- RRR , no murmur , no gallop  , no rub, nl s1 s2                           - JVD- none , edema 2-3+, stasis changes+, varices- none           Lung-  Distant+, wheeze-none unlabored, cough-none, dullness-none, rub- none           Chest wall-  Abd-  Br/ Gen/ Rectal- Not done, not indicated Extrem- cyanosis- none, clubbing, none, atrophy- none, strength- nl.  Neuro- grossly intact to observation

## 2021-09-04 NOTE — Telephone Encounter (Signed)
Called patient but he did not answer. Left message for him to call back.   I spoke with [facility]. She stated that CY is ok with video visits.

## 2021-09-07 ENCOUNTER — Other Ambulatory Visit: Payer: Self-pay

## 2021-09-07 ENCOUNTER — Other Ambulatory Visit: Payer: Self-pay | Admitting: *Deleted

## 2021-09-07 ENCOUNTER — Encounter: Payer: Self-pay | Admitting: Internal Medicine

## 2021-09-07 ENCOUNTER — Telehealth (INDEPENDENT_AMBULATORY_CARE_PROVIDER_SITE_OTHER): Payer: Medicare Other | Admitting: Internal Medicine

## 2021-09-07 DIAGNOSIS — J9611 Chronic respiratory failure with hypoxia: Secondary | ICD-10-CM | POA: Diagnosis not present

## 2021-09-07 DIAGNOSIS — J449 Chronic obstructive pulmonary disease, unspecified: Secondary | ICD-10-CM

## 2021-09-07 NOTE — Patient Outreach (Signed)
[facility] Post- Acute Care Coordinator follow up.   Verified in [place] that Mr. [name] transitioned from [place] SN[facility] to home on 09/05/21. He will have [name] home health.   Notification sent to [place] Management RNCM to make aware of member's transition from [facility].  [name] was active with [facility] [facility] prior.    [name] Rolling, MSN, RN,BSN [place] Acute Care Coordinator [phone] [facility]) [phone]  (Toll free office)

## 2021-09-07 NOTE — Patient Instructions (Addendum)
Suggest O2 for sleep set at 4L  Continue current meds.  I hope the visiting nurse, physical therapist  and hospital bed will prove helpful.  Please call if we can help  We will set up another video visit in about 6 weeks

## 2021-09-08 ENCOUNTER — Other Ambulatory Visit: Payer: Self-pay | Admitting: *Deleted

## 2021-09-08 NOTE — Patient Outreach (Signed)
[place] [facility]) Care Management  09/08/2021  [name] 07-Sep-1934 [id number]   Notified that member discharged home from SNF on 1/21.  Call placed to initiate transition of care, no answer, HIPAA compliant voice message left.    Plan: RN CM will follow up within the next 3-4 business days.  [name], RN, MSN, [place] Manager [phone]

## 2021-09-11 ENCOUNTER — Other Ambulatory Visit: Payer: Self-pay | Admitting: *Deleted

## 2021-09-11 NOTE — Patient Outreach (Signed)
[place] [facility]) Care Management  09/11/2021  [name] 1935-04-04 [id number]   Outreach attempt #2, unsuccessful, HIPAA compliant voice message left.  Will send outreach letter and follow up within the next 3-4 business days.  [name], RN, MSN, [place] Manager [phone]

## 2021-09-16 ENCOUNTER — Other Ambulatory Visit: Payer: Self-pay | Admitting: *Deleted

## 2021-09-16 NOTE — Patient Outreach (Signed)
[place] [facility]) Care Management Telephonic RN Care Manager Note   09/16/2021 Name:  [name] MRN:  [id number] DOB:  09-04-1934  Summary: Outreach attempt #3, successful.  Member and son both denies any urgent concerns, encouraged to contact this care manager with questions.    Subjective: [name] is an 87 y.o. year old male who is a primary patient of Mariea Clonts, Tiffany L, DO. The care management team was consulted for assistance with care management and/or care coordination needs.    Telephonic RN Care Manager completed Telephone Visit today.  Objective:   Medications Reviewed Today     Reviewed by Deneise Lever, MD (Physician) on 09/07/21 at 1149  Med List Status: <None>   Medication Order Taking? Sig Documenting Provider Last Dose Status Informant  acetaminophen (TYLENOL) 650 MG CR tablet [id number] Yes Take 1,300 mg by mouth See admin instructions. Take 2 tablets (1300 mg) by mouth every morning, may also take 1-2 tablets [phone] mg) at 4p-5p if needed for pain [provider] Taking Active Spouse/Significant Other  apixaban (ELIQUIS) 5 MG TABS tablet [id number] Yes Take 1 tablet (5 mg total) by mouth 2 (two) times daily. Patrecia Pour, MD Taking Active   Budeson-Glycopyrrol-Formoterol [facility] AEROSPHERE) 160-9-4.8 MCG/ACT Hollie Salk [id number] Yes Inhale 2 puffs into the lungs in the morning and at bedtime. Deneise Lever, MD Taking Active   diltiazem (CARDIZEM SR) 60 MG 12 hr capsule [id number] Yes Take 1 capsule (60 mg total) by mouth every 12 (twelve) hours. Patrecia Pour, MD Taking Active   fluticasone Asencion Islam) 50 MCG/ACT nasal spray [id number] Yes Place 1 spray into both nostrils daily as needed for allergies or rhinitis. [provider] Taking Active Spouse/Significant Other  furosemide (LASIX) 40 MG tablet [id number] Yes Take 1 tablet (40 mg total) by mouth daily. Patrecia Pour, MD Taking Active   guaiFENesin (MUCINEX) 600 MG 12 hr  tablet [id number] Yes Take 600 mg by mouth 2 (two) times daily as needed for cough or to loosen phlegm. [provider] Taking Active Spouse/Significant Other  ipratropium-albuterol (DUONEB) 0.5-2.5 (3) MG/3ML SOLN [id number] Yes Take 3 mLs by nebulization every 6 (six) hours as needed (for SOB AND wheezing every 6 hours AND PRN).  Patient taking differently: Take 3 mLs by nebulization every 6 (six) hours as needed (wheezing/shortness of breath).   Deneise Lever, MD Taking Active Spouse/Significant Other           Med Note Rosine Beat Jul 27, 2021  5:36 PM) Pt states that nebulizer treatments do not help  ketoconazole (NIZORAL) 2 % shampoo [id number] Yes Apply 1 application topically 3 (three) times a week. [provider] Taking Active Spouse/Significant Other  levalbuterol [facility] HFA) 45 MCG/ACT inhaler [id number] Yes Inhale 2 puffs every 6 hours as needed- rescue  Patient taking differently: Inhale 2 puffs into the lungs every 6 (six) hours as needed for wheezing or shortness of breath.   Baird Lyons D, MD Taking Active Spouse/Significant Other  melatonin 5 MG TABS [id number] Yes Take 5 mg by mouth at bedtime as needed (sleep). [provider] Taking Active Spouse/Significant Other  Nutritional Supplements (ENSURE HIGH PROTEIN) LIQD [id number] Yes Take 237 oz by mouth daily. [provider] Taking Active Spouse/Significant Other  OXYGEN [id number] Yes Inhale 3-6 L into the lungs continuous. 3 at rest, 4-5 with exertion, 6 during shower [provider] Taking Active Spouse/Significant Other  Polyethyl Glycol-Propyl Glycol (SYSTANE) 0.4-0.3 %  SOLN [id number] Yes Place 1 drop into both eyes 2 (two) times daily as needed (dry eyes). [provider] Taking Active Spouse/Significant Other  polyethylene glycol powder (GLYCOLAX/MIRALAX) powder [id number] Yes Take 17 g by mouth every morning. [provider] Taking Active  Spouse/Significant Other  pravastatin (PRAVACHOL) 40 MG tablet [id number] Yes TAKE 1 TABLET(40 MG) BY MOUTH DAILY  Patient taking differently: Take 40 mg by mouth every evening.   Reed, Tiffany L, DO Taking Active Spouse/Significant Other  tamsulosin (FLOMAX) 0.4 MG CAPS capsule [id number] Yes Take two capsules by mouth once daily.  Patient taking differently: 0.4 mg every evening.   Wardell Honour, MD Taking Active Self  vitamin B-12 (CYANOCOBALAMIN) 1000 MCG tablet [id number] Yes Take 1 tablet (1,000 mcg total) by mouth daily.  Patient taking differently: Take 1,000 mcg by mouth daily with lunch.   Gayland Curry, DO Taking Active Spouse/Significant Other  Med List Note Payton Doughty, CPhT 07/27/21 1747): [name]             SDOH:  (Social Determinants of Health) assessments and interventions performed:     Care Plan  Review of patient past medical history, allergies, medications, health status, including review of consultants reports, laboratory and other test data, was performed as part of comprehensive evaluation for care management services.   Care Plan : [facility] Plan of Care (Adult)  Updates made by [name], RN since 09/16/2021 12:00 AM     Problem: Chronic care management of chronic medical condition, COPD   Priority: High     Long-Range Goal: Member will be able to verbalize adequate management of chronic health condition, COPD   Start Date: 07/31/2021  Expected End Date: 01/27/2022  This Visit's Progress: On track  Priority: High  Note:   Current Barriers:  Chronic Disease Management support and education needs related to COPD  RNCM Clinical Goal(s):  Patient will verbalize understanding of plan for management of COPD as evidenced by ability to verbalize plan of care take all medications exactly as prescribed and will call provider for medication related questions as evidenced by reported adherence    attend all scheduled medical appointments: Pulmonary and PCP  as evidenced by Reported attendance        continue to work with RN Care Manager and/or Social Worker to address care management and care coordination needs related to COPD as evidenced by adherence to CM Team Scheduled appointments     work with Home health PT to increase strength as evidenced by reported ability to adequately care for self not experience hospital admission as evidenced by review of EMR. Hospital Admissions in last 6 months = 1  through collaboration with RN Care manager, provider, and care team.   Interventions: Inter-disciplinary care team collaboration (see longitudinal plan of care) Evaluation of current treatment plan related to  self management and patient's adherence to plan as established by provider   COPD: (Status: Goal on track: NO.) Long Term Goal  Reviewed medications with patient, including use of prescribed maintenance and rescue inhalers, and provided instruction on medication management and the importance of adherence Provided patient with basic written and verbal COPD education on self care/management/and exacerbation prevention Advised patient to track and manage COPD triggers Advised patient to self assesses COPD action plan zone and make appointment with provider if in the yellow zone for 48 hours without improvement Screening for signs and symptoms of depression related to chronic disease state   Patient Goals/Self-Care Activities: Take medications as  prescribed   Attend all scheduled provider appointments Call provider office for new concerns or questions  - avoid second hand smoke - identify and remove indoor air pollutants - limit outdoor activity during cold weather - do breathing exercises every day - develop a rescue plan - do breathing exercises at least 2 times each day   Update 2/1 - Member was readmitted to hospital 12/30-1/6 and discharged to SNF.  Stayed at SNF until 1/21.  Today he remains very short of breath, difficulty carrying on  conversation without gasping for air.  This has been his normal over the past several weeks, some days better than others.  He remains active with [facility] for palliative care, spoke with son who state member has follow up with them and PCP next week.  He now has 24/7 caregivers as well as home health.  Continues to take medications as instructed and wear oxygen continuously.  Video visit with pulmonology on 2/27.        Plan:  Telephone follow up appointment with care management team member scheduled for:  1 week. The patient has been provided with contact information for the care management team and has been advised to call with any health related questions or concerns.   [name], RN, MSN, [place] Manager [phone]

## 2021-09-17 ENCOUNTER — Telehealth: Payer: Self-pay | Admitting: Internal Medicine

## 2021-09-17 ENCOUNTER — Inpatient Hospital Stay (HOSPITAL_COMMUNITY)
Admission: EM | Admit: 2021-09-17 | Discharge: 2021-10-14 | DRG: 186 | Disposition: E | Payer: Medicare Other | Attending: Internal Medicine | Admitting: Internal Medicine

## 2021-09-17 ENCOUNTER — Emergency Department (HOSPITAL_COMMUNITY): Payer: Medicare Other

## 2021-09-17 ENCOUNTER — Encounter (HOSPITAL_COMMUNITY): Payer: Self-pay

## 2021-09-17 DIAGNOSIS — R627 Adult failure to thrive: Secondary | ICD-10-CM

## 2021-09-17 DIAGNOSIS — Z8701 Personal history of pneumonia (recurrent): Secondary | ICD-10-CM

## 2021-09-17 DIAGNOSIS — Z20822 Contact with and (suspected) exposure to covid-19: Secondary | ICD-10-CM | POA: Diagnosis present

## 2021-09-17 DIAGNOSIS — R0603 Acute respiratory distress: Secondary | ICD-10-CM

## 2021-09-17 DIAGNOSIS — I480 Paroxysmal atrial fibrillation: Secondary | ICD-10-CM | POA: Diagnosis present

## 2021-09-17 DIAGNOSIS — J939 Pneumothorax, unspecified: Secondary | ICD-10-CM

## 2021-09-17 DIAGNOSIS — J439 Emphysema, unspecified: Secondary | ICD-10-CM | POA: Diagnosis present

## 2021-09-17 DIAGNOSIS — Z825 Family history of asthma and other chronic lower respiratory diseases: Secondary | ICD-10-CM

## 2021-09-17 DIAGNOSIS — Z886 Allergy status to analgesic agent status: Secondary | ICD-10-CM

## 2021-09-17 DIAGNOSIS — J90 Pleural effusion, not elsewhere classified: Principal | ICD-10-CM | POA: Diagnosis present

## 2021-09-17 DIAGNOSIS — Z9981 Dependence on supplemental oxygen: Secondary | ICD-10-CM

## 2021-09-17 DIAGNOSIS — R351 Nocturia: Secondary | ICD-10-CM | POA: Diagnosis present

## 2021-09-17 DIAGNOSIS — Z823 Family history of stroke: Secondary | ICD-10-CM

## 2021-09-17 DIAGNOSIS — I10 Essential (primary) hypertension: Secondary | ICD-10-CM | POA: Diagnosis present

## 2021-09-17 DIAGNOSIS — Z7901 Long term (current) use of anticoagulants: Secondary | ICD-10-CM

## 2021-09-17 DIAGNOSIS — E785 Hyperlipidemia, unspecified: Secondary | ICD-10-CM | POA: Diagnosis present

## 2021-09-17 DIAGNOSIS — Z66 Do not resuscitate: Secondary | ICD-10-CM | POA: Diagnosis present

## 2021-09-17 DIAGNOSIS — R54 Age-related physical debility: Secondary | ICD-10-CM | POA: Diagnosis present

## 2021-09-17 DIAGNOSIS — Z7189 Other specified counseling: Secondary | ICD-10-CM

## 2021-09-17 DIAGNOSIS — Z8619 Personal history of other infectious and parasitic diseases: Secondary | ICD-10-CM | POA: Diagnosis not present

## 2021-09-17 DIAGNOSIS — Z7401 Bed confinement status: Secondary | ICD-10-CM

## 2021-09-17 DIAGNOSIS — Z86711 Personal history of pulmonary embolism: Secondary | ICD-10-CM | POA: Diagnosis not present

## 2021-09-17 DIAGNOSIS — Z888 Allergy status to other drugs, medicaments and biological substances status: Secondary | ICD-10-CM

## 2021-09-17 DIAGNOSIS — N401 Enlarged prostate with lower urinary tract symptoms: Secondary | ICD-10-CM | POA: Diagnosis present

## 2021-09-17 DIAGNOSIS — Z7951 Long term (current) use of inhaled steroids: Secondary | ICD-10-CM

## 2021-09-17 DIAGNOSIS — Z9109 Other allergy status, other than to drugs and biological substances: Secondary | ICD-10-CM

## 2021-09-17 DIAGNOSIS — Z9889 Other specified postprocedural states: Secondary | ICD-10-CM

## 2021-09-17 DIAGNOSIS — I2510 Atherosclerotic heart disease of native coronary artery without angina pectoris: Secondary | ICD-10-CM | POA: Diagnosis present

## 2021-09-17 DIAGNOSIS — Z681 Body mass index (BMI) 19 or less, adult: Secondary | ICD-10-CM

## 2021-09-17 DIAGNOSIS — M1990 Unspecified osteoarthritis, unspecified site: Secondary | ICD-10-CM | POA: Diagnosis present

## 2021-09-17 DIAGNOSIS — Z8249 Family history of ischemic heart disease and other diseases of the circulatory system: Secondary | ICD-10-CM | POA: Diagnosis not present

## 2021-09-17 DIAGNOSIS — J9621 Acute and chronic respiratory failure with hypoxia: Secondary | ICD-10-CM | POA: Diagnosis present

## 2021-09-17 DIAGNOSIS — G629 Polyneuropathy, unspecified: Secondary | ICD-10-CM | POA: Diagnosis present

## 2021-09-17 DIAGNOSIS — Z515 Encounter for palliative care: Secondary | ICD-10-CM | POA: Diagnosis not present

## 2021-09-17 DIAGNOSIS — J449 Chronic obstructive pulmonary disease, unspecified: Secondary | ICD-10-CM | POA: Diagnosis present

## 2021-09-17 DIAGNOSIS — Z881 Allergy status to other antibiotic agents status: Secondary | ICD-10-CM

## 2021-09-17 DIAGNOSIS — Z87891 Personal history of nicotine dependence: Secondary | ICD-10-CM | POA: Diagnosis not present

## 2021-09-17 LAB — PROTIME-INR
INR: 1.3 — ABNORMAL HIGH (ref 0.8–1.2)
Prothrombin Time: 16.1 seconds — ABNORMAL HIGH (ref 11.4–15.2)

## 2021-09-17 LAB — COMPREHENSIVE METABOLIC PANEL
ALT: 17 U/L (ref 0–44)
AST: 14 U/L — ABNORMAL LOW (ref 15–41)
Albumin: 2.8 g/dL — ABNORMAL LOW (ref 3.5–5.0)
Alkaline Phosphatase: 56 U/L (ref 38–126)
Anion gap: 8 (ref 5–15)
BUN: 28 mg/dL — ABNORMAL HIGH (ref 8–23)
CO2: 42 mmol/L — ABNORMAL HIGH (ref 22–32)
Calcium: 9.5 mg/dL (ref 8.9–10.3)
Chloride: 93 mmol/L — ABNORMAL LOW (ref 98–111)
Creatinine, Ser: 0.45 mg/dL — ABNORMAL LOW (ref 0.61–1.24)
GFR, Estimated: >60 mL/min (ref >60)
Glucose, Bld: 117 mg/dL — ABNORMAL HIGH (ref 70–99)
Potassium: 4.8 mmol/L (ref 3.5–5.1)
Sodium: 143 mmol/L (ref 135–145)
Total Bilirubin: 0.2 mg/dL — ABNORMAL LOW (ref 0.3–1.2)
Total Protein: 6.6 g/dL (ref 6.5–8.1)

## 2021-09-17 LAB — CBC WITH DIFFERENTIAL/PLATELET
Abs Immature Granulocytes: 0.1 10*3/uL — ABNORMAL HIGH (ref 0.00–0.07)
Basophils Absolute: 0 10*3/uL (ref 0.0–0.1)
Basophils Relative: 0 %
Eosinophils Absolute: 0 10*3/uL (ref 0.0–0.5)
Eosinophils Relative: 0 %
HCT: 35.6 % — ABNORMAL LOW (ref 39.0–52.0)
Hemoglobin: 9.9 g/dL — ABNORMAL LOW (ref 13.0–17.0)
Immature Granulocytes: 1 %
Lymphocytes Relative: 5 %
Lymphs Abs: 0.6 10*3/uL — ABNORMAL LOW (ref 0.7–4.0)
MCH: 26.9 pg (ref 26.0–34.0)
MCHC: 27.8 g/dL — ABNORMAL LOW (ref 30.0–36.0)
MCV: 96.7 fL (ref 80.0–100.0)
Monocytes Absolute: 0.5 10*3/uL (ref 0.1–1.0)
Monocytes Relative: 4 %
Neutro Abs: 10.1 10*3/uL — ABNORMAL HIGH (ref 1.7–7.7)
Neutrophils Relative %: 90 %
Platelets: 420 10*3/uL — ABNORMAL HIGH (ref 150–400)
RBC: 3.68 MIL/uL — ABNORMAL LOW (ref 4.22–5.81)
RDW: 15.1 % (ref 11.5–15.5)
WBC: 11.3 10*3/uL — ABNORMAL HIGH (ref 4.0–10.5)
nRBC: 0 % (ref 0.0–0.2)

## 2021-09-17 LAB — RESP PANEL BY RT-PCR (FLU A&B, COVID) ARPGX2
Influenza A by PCR: NEGATIVE
Influenza B by PCR: NEGATIVE
SARS Coronavirus 2 by RT PCR: NEGATIVE

## 2021-09-17 LAB — APTT: aPTT: 32 seconds (ref 24–36)

## 2021-09-17 LAB — LACTIC ACID, PLASMA: Lactic Acid, Venous: 1 mmol/L (ref 0.5–1.9)

## 2021-09-17 MED ORDER — LABETALOL HCL 5 MG/ML IV SOLN: 10.0000 mg | INTRAVENOUS | Status: DC | PRN

## 2021-09-17 MED ORDER — SODIUM CHLORIDE 0.9 % IV SOLN
2.0000 g | Freq: Once | INTRAVENOUS | Status: AC
  Administered 2021-09-17: 2 g via INTRAVENOUS
  Filled 2021-09-17: qty 2

## 2021-09-17 MED ORDER — VANCOMYCIN HCL 1250 MG/250ML IV SOLN
1250.0000 mg | Freq: Once | INTRAVENOUS | Status: AC
  Administered 2021-09-17: 1250 mg via INTRAVENOUS
  Filled 2021-09-17: qty 250

## 2021-09-17 MED ORDER — SODIUM CHLORIDE 0.9 % IV SOLN
2.0000 g | Freq: Three times a day (TID) | INTRAVENOUS | Status: DC
  Administered 2021-09-17 – 2021-09-18 (×2): 2 g via INTRAVENOUS
  Filled 2021-09-17 (×2): qty 2

## 2021-09-17 MED ORDER — ALBUTEROL SULFATE (2.5 MG/3ML) 0.083% IN NEBU
5.0000 mg | INHALATION_SOLUTION | Freq: Once | RESPIRATORY_TRACT | Status: AC
  Administered 2021-09-17: 5 mg via RESPIRATORY_TRACT
  Filled 2021-09-17: qty 6

## 2021-09-17 MED ORDER — HYDRALAZINE HCL 20 MG/ML IJ SOLN: 10.0000 mg | INTRAMUSCULAR | Status: DC | PRN

## 2021-09-17 MED ORDER — LIDOCAINE HCL 1 % IJ SOLN
INTRAMUSCULAR | Status: AC
  Administered 2021-09-17: 10 mL
  Filled 2021-09-17: qty 20

## 2021-09-17 MED ORDER — ENOXAPARIN SODIUM 40 MG/0.4ML IJ SOSY
40.0000 mg | PREFILLED_SYRINGE | INTRAMUSCULAR | Status: DC
  Administered 2021-09-17: 40 mg via SUBCUTANEOUS
  Filled 2021-09-17: qty 0.4

## 2021-09-17 MED ORDER — SODIUM CHLORIDE 0.9 % IV SOLN: 2.0000 g | Freq: Once | INTRAVENOUS | Status: DC

## 2021-09-17 MED ORDER — VANCOMYCIN HCL IN DEXTROSE 1-5 GM/200ML-% IV SOLN
1000.0000 mg | Freq: Once | INTRAVENOUS | Status: DC
  Filled 2021-09-17: qty 200

## 2021-09-17 MED ORDER — METHYLPREDNISOLONE SODIUM SUCC 40 MG IJ SOLR
40.0000 mg | Freq: Two times a day (BID) | INTRAMUSCULAR | Status: DC
  Administered 2021-09-17: 40 mg via INTRAVENOUS
  Filled 2021-09-17: qty 1

## 2021-09-17 NOTE — ED Notes (Signed)
Xray at the bedside.

## 2021-09-17 NOTE — Assessment & Plan Note (Addendum)
-   de-escalate medications in pursuit of comfort care

## 2021-09-17 NOTE — Assessment & Plan Note (Signed)
-   PNX in Dec 2022, now again s/p thoracentesis on 2/2 - no treatment planned for PNX; continue supportive measures; family understands and agrees

## 2021-09-17 NOTE — Hospital Course (Addendum)
[name] is an 87 year old male with PMH COPD, PAF, chronic hypoxic respiratory failure, CAD, HTN, HLD, BPH and ongoing failure to thrive.  He has had recurrent hospitalizations notably within the past 1-2 mths when he was treated for pneumonia with COPD exacerbation, a PE, and pneumothorax.  After these recurrent hospitalizations, he has had ongoing failure to thrive with difficulty recovering at home.  He is accompanied by his youngest son in the ER who states that he has remained bedbound for at least the past 1.5 months.  His appetite seems to have remained fairly well with no overt signs of aspiration or difficulty swallowing at home.  He also underwent SLP eval last hospitalization with no significant signs of aspiration and was continued on a regular diet. Today he comes into the hospital with significant respiratory distress; he was found to have a worsening right pleural effusion with loculations overall increased in size compared to imaging from early January.  He underwent right-sided thoracentesis while in the ER removing 1 L fluid with improvement in his respiratory status.  There was interval development of a right subpulmonic pneumothorax after thoracentesis. Goals of care discussions were held in the ER.  Family wished for no treatment for the pneumothorax, continuing DNR status, and continuing supportive measures and some treatment to allow family further time to arrive.   On 09/18/2021 after further family was able to visit with patient, decision was made for transitioning to comfort care.  His breathing pattern had again worsened since admission and he was uncomfortable with ongoing significant hypoxia requiring NRB. He was started a morphine drip for comfort.  He had progressive decline and passed naturally at 12:08 am on 10/05/21.

## 2021-09-17 NOTE — ED Notes (Signed)
Pt to [place] at this time for thoracentesis. Pt transported to [place] with travel cardiac monitor, on 8L non-rebreather with [place] tech transporting. EDP aware.

## 2021-09-17 NOTE — Assessment & Plan Note (Addendum)
-   continue O2 - d/c cefepime and solumedrol as transitioning to comfort

## 2021-09-17 NOTE — Procedures (Addendum)
PROCEDURE SUMMARY:  Successful US guided right thoracentesis. Yielded  of 1 L of amber-colored/light red fluid. Pt tolerated procedure well. No immediate complications.  CXR ordered; small right sub-pulmonic pneumothorax identified. Trapped lung suspected. ED physician notified by Dr. Serafina Royals  EBL < 2 mL  Theresa Duty, NP 10/12/2021 5:05 PM

## 2021-09-17 NOTE — ED Notes (Signed)
Hospitalist at the bedside 

## 2021-09-17 NOTE — ED Notes (Signed)
This Probation officer was informed by the patient's tech that he could not breath. RN went to check on the patient. Sat's were normal however increased respiratory effort was noted. RT was contacted and was able to see the patient. MD was also made aware. RN was informed that the goal was to keep the patient comfortable. Patient's RN was informed. Patient's O2 was increased to 8 L.

## 2021-09-17 NOTE — ED Triage Notes (Signed)
Pt arrived via EMS from home. Per EMS, pt as recently d/c'd after being after being treated for PNA. Per EMS, family noted "increased work of breathing." Pt was on 6L nasal cannula at home, satting at 88%. Pt placed on simple mask by EMS and at 94% on 8L at this time. Pt lives at home with elderly wife, on Hospice/Palliative care. Per EMS, pt is unable to care for her husband alone and pt is in process of being moved to SNF placement.

## 2021-09-17 NOTE — ED Notes (Signed)
EDP at the bedside.  ?

## 2021-09-17 NOTE — Telephone Encounter (Signed)
Spoke with Eustaquio Maize, Therapist, sports, from [place].  Patient declining despite interventions.  Some may not be getting done as directed by in-home caregivers ie the nebs, sat monitoring and use of flutter valve and IS.  I will reach out to son, Liliane Channel, to arrange f/u visit with hospice discussion.  Son, Shanon Brow, has not been present for visits per RN.

## 2021-09-17 NOTE — H&P (Signed)
History and Physical    Patient: [name] VVO:[id number] DOB: 07-Jun-1935 DOA: 10/08/2021 DOS: the patient was seen and examined on 09/16/2021 PCP: Gayland Curry, DO  Patient coming from: Home  Chief Complaint:  Chief Complaint  Patient presents with   Shortness of Breath    HPI:  [name] is an 87 year old male with PMH COPD, PAF, chronic hypoxic respiratory failure, CAD, HTN, HLD, BPH and ongoing failure to thrive.  He has had recurrent hospitalizations notably within the past 1-2 mths when he was treated for pneumonia with COPD exacerbation, a PE, and pneumothorax.  After these recurrent hospitalizations, he has had ongoing failure to thrive with difficulty recovering at home.  He is accompanied by his youngest son in the ER who states that he has remained bedbound for at least the past 1.5 months.  His appetite seems to have remained fairly well with no overt signs of aspiration or difficulty swallowing at home.  He also underwent SLP eval last hospitalization with no significant signs of aspiration and was continued on a regular diet. Today he comes into the hospital with significant respiratory distress; he was found to have a worsening right pleural effusion with loculations overall increased in size compared to imaging from early January.  He underwent right-sided thoracentesis while in the ER removing 1 L fluid with improvement in his respiratory status.  There was interval development of a right subpulmonic pneumothorax after thoracentesis. Goals of care discussions were held in the ER.  Family wished for no treatment for the pneumothorax, continuing DNR status, and continuing supportive measures and some treatment to allow family further time to arrive.  After which time, they plan to transition to comfort care. On assessment, patient was minimally responsive but resting comfortably in bed with irregular breathing pattern which his son states is his typical pattern at  home.  Review of Systems: Unable to review all systems due to lack of cooperation from patient. Patient is minimally unresponsive and nonverbal Past Medical History:  Diagnosis Date   Allergic rhinitis, cause unspecified    Arthritis    COPD (chronic obstructive pulmonary disease) ([place])    Dyspnea    Dysrhythmia    "1 episode of tachycardia in 1980's, been on it ever since"   Emphysema of lung ([place])    Hypertension    Left inguinal hernia 02/07/2019   Low hemoglobin    low level   Neuromuscular disorder (HCC)    Neuropathy    feet   Pneumonia    History of, last Oct 2019   Pulmonary nodule    Past Surgical History:  Procedure Laterality Date   [place]  2003   performed at [facility], Dr. Fransico Him   CATARACT EXTRACTION  2008   Dr. Darleen Crocker   CATARACT EXTRACTION, BILATERAL     EYE SURGERY     INGUINAL HERNIA REPAIR Left 02/07/2019   Procedure: OPEN REPAIR LEFT INGUINAL HERNIA WITH MESH;  Surgeon: Fanny Skates, MD;  Location: [place];  Service: General;  Laterality: Left;  SPINAL AND TAP BLOCK ANESTHESIA   ROTATOR CUFF REPAIR  2002   Dr. Kathryne Hitch   TONSILLECTOMY  1945   VASECTOMY     Social History:  reports that he quit smoking about 31 years ago. His smoking use included cigarettes. He has a 76.00 pack-year smoking history. He has never used smokeless tobacco. He reports current alcohol use of about 7.0 standard drinks per week.  He reports that he does not use drugs.  Allergies  Allergen Reactions   Tolectin [Tolmetin Sodium] Other (See Comments)    BP low and passed out   Montelukast Sodium Other (See Comments)    Unknown reaction   Nsaids Other (See Comments)    MD told pt not to use   Augmentin [Amoxicillin-Pot Clavulanate] Nausea And Vomiting    Did it involve swelling of the face/tongue/throat, SOB, or low BP? No Did it involve sudden or severe rash/hives, skin peeling, or any reaction on the inside of your mouth or  nose? No Did you need to seek medical attention at a hospital or doctor's office? No When did it last happen? Last year   If all above answers are "NO", may proceed with cephalosporin use.   Neosporin [Bacitracin-Polymyxin B] Rash    Family History  Problem Relation Age of Onset   COPD Mother    Stroke Mother    Stroke Father    Coronary artery disease Other        [facility]    Prior to Admission medications   Medication Sig Start Date End Date Taking? Authorizing Provider  acetaminophen (TYLENOL) 650 MG CR tablet Take 1,300 mg by mouth See admin instructions. Take 2 tablets (1300 mg) by mouth every morning, may also take 1-2 tablets ([phone] mg) at 4p-5p if needed for pain    [provider]  apixaban (ELIQUIS) 5 MG TABS tablet Take 1 tablet (5 mg total) by mouth 2 (two) times daily. 08/21/21   Patrecia Pour, MD  Budeson-Glycopyrrol-Formoterol (BREZTRI AEROSPHERE) 160-9-4.8 MCG/ACT AERO Inhale 2 puffs into the lungs in the morning and at bedtime. 08/26/21   Deneise Lever, MD  diltiazem (CARDIZEM SR) 60 MG 12 hr capsule Take 1 capsule (60 mg total) by mouth every 12 (twelve) hours. 08/21/21   Patrecia Pour, MD  fluticasone (FLONASE) 50 MCG/ACT nasal spray Place 1 spray into both nostrils daily as needed for allergies or rhinitis.    [provider]  furosemide (LASIX) 40 MG tablet Take 1 tablet (40 mg total) by mouth daily. 08/22/21   Patrecia Pour, MD  guaiFENesin (MUCINEX) 600 MG 12 hr tablet Take 600 mg by mouth 2 (two) times daily as needed for cough or to loosen phlegm.    [provider]  ipratropium-albuterol (DUONEB) 0.5-2.5 (3) MG/3ML SOLN Take 3 mLs by nebulization every 6 (six) hours as needed (for SOB AND wheezing every 6 hours AND PRN). Patient taking differently: Take 3 mLs by nebulization every 6 (six) hours as needed (wheezing/shortness of breath). 02/23/21   Deneise Lever, MD  ketoconazole (NIZORAL) 2 % shampoo Apply 1 application topically 3 (three)  times a week. 05/01/20   [provider]  levalbuterol Penne Lash HFA) 45 MCG/ACT inhaler Inhale 2 puffs every 6 hours as needed- rescue Patient taking differently: Inhale 2 puffs into the lungs every 6 (six) hours as needed for wheezing or shortness of breath. 07/21/21   Baird Lyons D, MD  melatonin 5 MG TABS Take 5 mg by mouth at bedtime as needed (sleep).    [provider]  Nutritional Supplements (ENSURE HIGH PROTEIN) LIQD Take 237 oz by mouth daily.    [provider]  OXYGEN Inhale 3-6 L into the lungs continuous. 3 at rest, 4-5 with exertion, 6 during shower    [provider]  Polyethyl Glycol-Propyl Glycol (SYSTANE) 0.4-0.3 % SOLN Place 1 drop into both eyes 2 (two) times daily  as needed (dry eyes).    [provider]  polyethylene glycol powder (GLYCOLAX/MIRALAX) powder Take 17 g by mouth every morning.    [provider]  pravastatin (PRAVACHOL) 40 MG tablet TAKE 1 TABLET(40 MG) BY MOUTH DAILY Patient taking differently: Take 40 mg by mouth every evening. 10/23/20   Reed, Tiffany L, DO  tamsulosin (FLOMAX) 0.4 MG CAPS capsule Take two capsules by mouth once daily. Patient taking differently: 0.4 mg every evening. 01/14/21   Wardell Honour, MD  vitamin B-12 (CYANOCOBALAMIN) 1000 MCG tablet Take 1 tablet (1,000 mcg total) by mouth daily. Patient taking differently: Take 1,000 mcg by mouth daily with lunch. 10/19/19   Gayland Curry, DO    Physical Exam: Vitals:   10/02/2021 1658 10/02/2021 1715 09/18/2021 1745 10/08/2021 1800  BP: (!) 128/57 103/65 99/62 (!) 111/53  Pulse: (!) 106 97 89 97  Resp: (!) 26 (!) 33 (!) 30 (!) 27  SpO2: 100% 100% 98% 100%  Weight:      Height:      Physical Exam Constitutional:      Comments: Chronically ill-appearing elderly gentleman lying in bed with irregular respirations and minimally responsive, nonverbal  HENT:     Head: Normocephalic and atraumatic.     Mouth/Throat:     Mouth: Mucous membranes  are dry.  Eyes:     Extraocular Movements: Extraocular movements intact.  Cardiovascular:     Rate and Rhythm: Normal rate and regular rhythm.  Pulmonary:     Comments: B/L coarse sounds but decreased right sided breath sounds and irregular respirations Abdominal:     General: Bowel sounds are normal. There is no distension.     Palpations: Abdomen is soft.  Musculoskeletal:        General: No swelling.     Cervical back: Normal range of motion and neck supple.  Skin:    General: Skin is warm and dry.  Neurological:     Comments: Minimally responsive      Data Reviewed:  I have Reviewed nursing notes, Vitals, and Lab results since pt's last encounter. Pertinent lab results CO2 42, creat 0.45, BUN 28, WBC 11.3  I have ordered test including BMP I have independently visualized and interpreted imaging CXR which showed dense right sided opacities and subpulmonic PNX. I have reviewed the last note from staff for current admission,  I have discussed pt's care plan and test results with nursing staff.   Assessment and Plan: * Acute on chronic respiratory failure with hypoxia ([place])- (present on admission) - On approximately 6 L O2 at home which family states can turn up to 10 L on their liquid oxygen tank -After thoracentesis, he is down to 8 L, breathing more comfortably -DNR confirmed.  Continue oxygen support in efforts for allowing family further time to arrive  Pneumothorax on right - PNX in Dec 2022, now again s/p thoracentesis on 2/2 - no treatment planned for PNX; continue supportive measures; family understands and agrees  Goals of care, counseling/discussion - see failure to thrive as well - progressive functional and physical decline over the past several weeks - family traveling from [place] then further discussions to be held but tentative plan would be home with hospice if able/stable enough vs residential hospice vs inpatient hospice - DNR/DNI confirmed - unrestricted  visitor status in anticipation of comfort care   Failure to thrive in adult - progressive functional decline over the past 2 months at minimum; does eat okay but is  no longer ambulatory and his overall physical reserve is extremely poor -Has been followed outpatient with [facility] -Tentative plan is to continue treatment aggressively for pneumonia for now and once further family arrives, transitioning to comfort care but if patient is able to return home and stable enough we will pursue that as well  PAF (paroxysmal atrial fibrillation) (HCC) - will start to de-escalate medications in anticipation of pursuing comfort care  Benign prostatic hyperplasia with nocturia- (present on admission) - will start to de-escalate medications in anticipation of pursuing comfort care  Hypertension- (present on admission) - low/normal BP for now - can use labetalol or hydralazine PRN for now   Hyperlipidemia- (present on admission) - will start to de-escalate medications in anticipation of pursuing comfort care  CAD (coronary artery disease)- (present on admission) - will start to de-escalate medications in anticipation of pursuing comfort care  COPD mixed type ([place])- (present on admission) - continue O2 - continue cefepime and solumedrol        Advance Care Planning:   Code Status: DNR   Consults:   Family Communication: son bedside  Severity of Illness: The appropriate patient status for this patient is INPATIENT. Inpatient status is judged to be reasonable and necessary in order to provide the required intensity of service to ensure the patient's safety. The patient's presenting symptoms, physical exam findings, and initial radiographic and laboratory data in the context of their chronic comorbidities is felt to place them at high risk for further clinical deterioration. Furthermore, it is not anticipated that the patient will be medically stable for discharge from the hospital within 2  midnights of admission.   * I certify that at the point of admission it is my clinical judgment that the patient will require inpatient hospital care spanning beyond 2 midnights from the point of admission due to high intensity of service, high risk for further deterioration and high frequency of surveillance required.*  Author: Dwyane Dee, MD 09/18/2021 6:10 PM  For on call review [email or web address].

## 2021-09-17 NOTE — Assessment & Plan Note (Addendum)
-   progressive functional decline over the past 2 months at minimum; does eat okay but is no longer ambulatory and his overall physical reserve is extremely poor -Has been followed outpatient with [facility] - as of 2/3 and our morning discussion about GOC, family amenable with transition to full comfort care, especially as he's noted to be more uncomfortable appearing and worsening respiratory status overall - status changed to comfort care; morphine drip initiated for comfort

## 2021-09-17 NOTE — ED Provider Notes (Signed)
[facility] DEPT Provider Note   CSN: [id number] Arrival date & time: 09/27/2021  1345     History  Chief Complaint  Patient presents with   Shortness of Breath    [name] is a 87 y.o. male.  HPI Patient presents via EMS in respiratory distress.  History is obtained by those individuals, chart reviewed.  Patient answer some questions briefly, stating that he denies pain, feels weak, acknowledges dyspnea.  Similarly the patient had pneumonia complicated by pleural effusion, pneumothorax about 1 month ago.  On discussing this with family subsequently, seems that the patient was in nursing home, home, never substantially improved, and has been progressively weak with increased work of breathing over the past day.  Home health nurse found him to be hypoxic, sent him here for evaluation.   5:06 PM Upon return from thoracentesis the patient's work of breathing has diminished, respiratory rate is now in the mid 20s requires it was mid 30s prior to the procedure.  I reviewed his x-ray, and my colleague has discussed thoracentesis with IR radiology colleagues.  Son aware of all findings, confirms DNR status, requests a room with more capacity for family members to be present.  Palliative care has been consulted, hospitalist has been consulted for palliative admission.  Dr. Maryan Rued is aware of the patient. Home Medications Prior to Admission medications   Medication Sig Start Date End Date Taking? Authorizing Provider  acetaminophen (TYLENOL) 650 MG CR tablet Take 1,300 mg by mouth See admin instructions. Take 2 tablets (1300 mg) by mouth every morning, may also take 1-2 tablets ([phone] mg) at 4p-5p if needed for pain    [provider]  apixaban (ELIQUIS) 5 MG TABS tablet Take 1 tablet (5 mg total) by mouth 2 (two) times daily. 08/21/21   Patrecia Pour, MD  Budeson-Glycopyrrol-Formoterol (BREZTRI AEROSPHERE) 160-9-4.8 MCG/ACT AERO Inhale 2 puffs into  the lungs in the morning and at bedtime. 08/26/21   Deneise Lever, MD  diltiazem (CARDIZEM SR) 60 MG 12 hr capsule Take 1 capsule (60 mg total) by mouth every 12 (twelve) hours. 08/21/21   Patrecia Pour, MD  fluticasone (FLONASE) 50 MCG/ACT nasal spray Place 1 spray into both nostrils daily as needed for allergies or rhinitis.    [provider]  furosemide (LASIX) 40 MG tablet Take 1 tablet (40 mg total) by mouth daily. 08/22/21   Patrecia Pour, MD  guaiFENesin (MUCINEX) 600 MG 12 hr tablet Take 600 mg by mouth 2 (two) times daily as needed for cough or to loosen phlegm.    [provider]  ipratropium-albuterol (DUONEB) 0.5-2.5 (3) MG/3ML SOLN Take 3 mLs by nebulization every 6 (six) hours as needed (for SOB AND wheezing every 6 hours AND PRN). Patient taking differently: Take 3 mLs by nebulization every 6 (six) hours as needed (wheezing/shortness of breath). 02/23/21   Deneise Lever, MD  ketoconazole (NIZORAL) 2 % shampoo Apply 1 application topically 3 (three) times a week. 05/01/20   [provider]  levalbuterol Penne Lash HFA) 45 MCG/ACT inhaler Inhale 2 puffs every 6 hours as needed- rescue Patient taking differently: Inhale 2 puffs into the lungs every 6 (six) hours as needed for wheezing or shortness of breath. 07/21/21   Baird Lyons D, MD  melatonin 5 MG TABS Take 5 mg by mouth at bedtime as needed (sleep).    [provider]  Nutritional Supplements (ENSURE HIGH PROTEIN) LIQD Take 237 oz by mouth daily.  [provider]  OXYGEN Inhale 3-6 L into the lungs continuous. 3 at rest, 4-5 with exertion, 6 during shower    [provider]  Polyethyl Glycol-Propyl Glycol (SYSTANE) 0.4-0.3 % SOLN Place 1 drop into both eyes 2 (two) times daily as needed (dry eyes).    [provider]  polyethylene glycol powder (GLYCOLAX/MIRALAX) powder Take 17 g by mouth every morning.    [provider]  pravastatin (PRAVACHOL) 40 MG tablet  TAKE 1 TABLET(40 MG) BY MOUTH DAILY Patient taking differently: Take 40 mg by mouth every evening. 10/23/20   Reed, Tiffany L, DO  tamsulosin (FLOMAX) 0.4 MG CAPS capsule Take two capsules by mouth once daily. Patient taking differently: 0.4 mg every evening. 01/14/21   Wardell Honour, MD  vitamin B-12 (CYANOCOBALAMIN) 1000 MCG tablet Take 1 tablet (1,000 mcg total) by mouth daily. Patient taking differently: Take 1,000 mcg by mouth daily with lunch. 10/19/19   Reed, Tiffany L, DO      Allergies    Tolectin [tolmetin sodium], Montelukast sodium, Nsaids, Augmentin [amoxicillin-pot clavulanate], and Neosporin [bacitracin-polymyxin b]    Review of Systems   Review of Systems  Unable to perform ROS: Acuity of condition   Physical Exam Updated Vital Signs BP 128/73    Pulse (!) 118    Resp (!) 31    Ht 5\' 9"  (1.753 m)    Wt 56.2 kg    SpO2 100%    BMI 18.31 kg/m  Physical Exam Vitals and nursing note reviewed.  Constitutional:      General: He is in acute distress.     Appearance: He is ill-appearing and diaphoretic.  HENT:     Head: Normocephalic and atraumatic.  Eyes:     Conjunctiva/sclera: Conjunctivae normal.  Cardiovascular:     Rate and Rhythm: Regular rhythm. Tachycardia present.  Pulmonary:     Effort: Tachypnea, accessory muscle usage and respiratory distress present.     Breath sounds: Decreased breath sounds and wheezing present.  Abdominal:     General: There is no distension.  Skin:    General: Skin is warm.  Neurological:     Mental Status: He is alert.     Motor: Atrophy present.     Comments: She is minimally audible, face is symmetric, there is diffuse atrophy, but the patient does move all extremities spontaneously.    ED Results / Procedures / Treatments   Labs (all labs ordered are listed, but only abnormal results are displayed) Labs Reviewed  COMPREHENSIVE METABOLIC PANEL - Abnormal; Notable for the following components:      Result Value   Chloride 93  (*)    CO2 42 (*)    Glucose, Bld 117 (*)    BUN 28 (*)    Creatinine, Ser 0.45 (*)    Albumin 2.8 (*)    AST 14 (*)    Total Bilirubin 0.2 (*)    All other components within normal limits  CBC WITH DIFFERENTIAL/PLATELET - Abnormal; Notable for the following components:   WBC 11.3 (*)    RBC 3.68 (*)    Hemoglobin 9.9 (*)    HCT 35.6 (*)    MCHC 27.8 (*)    Platelets 420 (*)    Neutro Abs 10.1 (*)    Lymphs Abs 0.6 (*)    Abs Immature Granulocytes 0.10 (*)    All other components within normal limits  PROTIME-INR - Abnormal; Notable for the following components:   Prothrombin Time 16.1 (*)  INR 1.3 (*)    All other components within normal limits  RESP PANEL BY RT-PCR (FLU A&B, COVID) ARPGX2  CULTURE, BLOOD (ROUTINE X 2)  CULTURE, BLOOD (ROUTINE X 2)  URINE CULTURE  LACTIC ACID, PLASMA  APTT  URINALYSIS, ROUTINE W REFLEX MICROSCOPIC    EKG EKG Interpretation  Date/Time:  Thursday September 17 2021 14:49:04 EST Ventricular Rate:  119 PR Interval:    QRS Duration: 104 QT Interval:  343 QTC Calculation: 449 R Axis:   129 Text Interpretation: Atrial fibrillation Poor data quality Low voltage, extremity and precordial leads Probable anteroseptal infarct, old Nonspecific T abnormalities, lateral leads Abnormal ECG Confirmed by Carmin Muskrat [phone]) on 10/03/2021 3:02:27 PM  Radiology DG Chest Port 1 View  Result Date: 09/21/2021 CLINICAL DATA:  Sepsis EXAM: PORTABLE CHEST 1 VIEW COMPARISON:  Chest x-ray dated August 21, 2021 FINDINGS: Visualized cardiac and mediastinal contours are unchanged. Persistent linear consolidation of the right upper lung. Large loculated right pleural effusion which is increased in size when compared to prior exam. No evidence of pneumothorax. IMPRESSION: Large loculated right pleural effusion which is increased in size when compared to prior exam. Electronically Signed   By: Yetta Glassman M.D.   On: 09/16/2021 15:32    Procedures Procedures     Medications Ordered in ED Medications  lidocaine (XYLOCAINE) 1 % (with pres) injection (has no administration in time range)  albuterol (PROVENTIL) (2.5 MG/3ML) 0.083% nebulizer solution 5 mg (5 mg Nebulization Given 10/02/2021 1543)   With concern for respiratory distress after initial evaluation patient placed on continuous cardiac monitor, pulse oximetry.  Patient received bronchodilators, and soon required transition to BiPAP.  On reviewing his chart it is clear the patient is DNR, and conversations with palliative had started, but is not formally enrolled in hospice care.  Update: I reviewed the patient's bedside x-ray concerning for pleural effusion plus minus pneumonia, patient continues to receive BiPAP for support, and accompanied bedside by 1 son and have discussed his findings thus far with his other son who is in route from [place].  We discussed the patient's substantial decline in status, his CODE STATUS, and efforts to help him through this episode of respiratory distress given the known context of COPD, recent ammonia, recent pleural effusion, recent pneumothorax with chest tube.  4:29 PM I discussed the patient case with our radiology colleagues and the patient will have ultrasound thoracentesis to alleviate increased work of breathing given his substantial pleural effusion.  Signed consents for his father.  ED Course/ Medical Decision Making/ A&P This patient presents to the ED for concern of respiratory distress in the context of recent hospitalization for pneumonia/pneumothorax/pleural effusion with a history of COPD, this involves an extensive number of treatment options, and is a complaint that carries with it a high risk of complications and morbidity.  The differential diagnosis includes recurrence of all of the above, versus ACS, bacteremia, sepsis, now complicated by the patient's decline in status over the past month   Co morbidities that complicate the patient  evaluation  COPD, failure to thrive, prior long hospitalization   Social Determinants of Health:  Age, decreased functionality   Additional history obtained:  Additional history and/or information obtained from 2 sons, chart review External records from outside source obtained and reviewed including note from discharge earlier this month, and procedure note of thoracentesis   Lab Tests:  I Ordered (or co-signed), and personally interpreted labs.  The pertinent results include: Labs that are consistent with  prior, negative COVID   Imaging Studies ordered:  I ordered (or co-signed) imaging studies including x-ray ultrasound thoracentesis I independently visualized and interpreted imaging which showed recurrence of pleural effusion I agree with the radiologist interpretation   Cardiac Monitoring:  The patient was maintained on a cardiac monitor.  The cardiac monitored showed an rhythm of sinus tachycardia, rate 120. The patient was also maintained on pulse oximetry. The readings were typically 97% with substantial oxygen supplementation.   Medicines ordered and prescription drug management:  I ordered medication including bronchodilator, broad-spectrum antibiotics Reevaluation of the patient after these medicines showed that the patient stayed the same   Consultations Obtained:  I requested consultation with the radiology for thoracentesis,  and discussed lab and imaging findings as well as pertinent plan - they recommend: Thoracentesis   Reevaluation:  After the interventions noted above, I reevaluated the patient and found that they have :stayed the same.  Patient has persistent opacification   Dispostion:  After consideration of the diagnostic results and the patients response to treatment, I feel that the patent would benefit from hospitalization, though with acknowledged poor prognosis given his prior palliative consultation, critical illness..     Final  Clinical Impression(s) / ED Diagnoses Final diagnoses:  Pleural effusion  Respiratory distress   CRITICAL CARE Performed by: Carmin Muskrat Total critical care time: 35 minutes Critical care time was exclusive of separately billable procedures and treating other patients. Critical care was necessary to treat or prevent imminent or life-threatening deterioration. Critical care was time spent personally by me on the following activities: development of treatment plan with patient and/or surrogate as well as nursing, discussions with consultants, evaluation of patient's response to treatment, examination of patient, obtaining history from patient or surrogate, ordering and performing treatments and interventions, ordering and review of laboratory studies, ordering and review of radiographic studies, pulse oximetry and re-evaluation of patient's condition.    Carmin Muskrat, MD 09/30/2021 [phone]

## 2021-09-17 NOTE — ED Notes (Signed)
This nurse notified by RT at this time, pt's son at the bedside does not want pt to be placed back on bi-pap. EDP notified and aware. Pt back from [place] at this time and remains on 8L non-rebreather.

## 2021-09-17 NOTE — ED Notes (Signed)
Per Dr. Vanita Panda, EDP's verbal order, pt to be taken off of Bi-Pap for thoracentesis and placed on non-rebreather at this time.

## 2021-09-17 NOTE — Progress Notes (Signed)
[facility]) Hospital Liaison note:  This patient is currently enrolled in [facility] outpatient-based Palliative Care. Will continue to follow for disposition.  Please call with any outpatient palliative questions or concerns.  Thank you, Lorelee Market, LPN [facility] Liaison [phone]

## 2021-09-17 NOTE — Assessment & Plan Note (Addendum)
-   see failure to thrive as well - progressive functional and physical decline over the past several weeks - son has arrived from [place] and is bedside this am, 2/3; update given and questions answered.  - he is no longer stable for discharge from the hospital. At this time we'll transition to inpatient comfort care and start on morphine drip; titrate for comfort - DNR/DNI confirmed - unrestricted visitor status

## 2021-09-17 NOTE — Progress Notes (Signed)
A consult was received from an ED physician for Vancomycin per pharmacy dosing.  The patient's profile has been reviewed for ht/wt/allergies/indication/available labs.   A one time order has been placed for Vancomycin 1250mg  IV.  Further antibiotics/pharmacy consults should be ordered by admitting physician if indicated.                       Thank you, Luiz Ochoa 10/05/2021  5:00 PM

## 2021-09-17 NOTE — ED Notes (Signed)
RT at the bedside. Pt being Bi-papped at this time.

## 2021-09-17 NOTE — Assessment & Plan Note (Signed)
-   low/normal BP for now - can use labetalol or hydralazine PRN for now

## 2021-09-17 NOTE — Assessment & Plan Note (Addendum)
-   On approximately 6 L O2 at home  -After thoracentesis O2 improved to 8L but overnight he now is on NRB with worsening respiratory status this am - given lack of improvement and ongoing decline, family okay with transitioning to comfort care officially

## 2021-09-18 DIAGNOSIS — J939 Pneumothorax, unspecified: Secondary | ICD-10-CM | POA: Diagnosis not present

## 2021-09-18 DIAGNOSIS — Z7189 Other specified counseling: Secondary | ICD-10-CM | POA: Diagnosis not present

## 2021-09-18 DIAGNOSIS — J9621 Acute and chronic respiratory failure with hypoxia: Secondary | ICD-10-CM | POA: Diagnosis not present

## 2021-09-18 DIAGNOSIS — R627 Adult failure to thrive: Secondary | ICD-10-CM | POA: Diagnosis not present

## 2021-09-18 LAB — URINALYSIS, ROUTINE W REFLEX MICROSCOPIC
Bacteria, UA: NONE SEEN
Bilirubin Urine: NEGATIVE
Glucose, UA: NEGATIVE mg/dL
Ketones, ur: NEGATIVE mg/dL
Leukocytes,Ua: NEGATIVE
Nitrite: NEGATIVE
Protein, ur: NEGATIVE mg/dL
Specific Gravity, Urine: 1.03 (ref 1.005–1.030)
pH: 6 (ref 5.0–8.0)

## 2021-09-18 MED ORDER — POLYVINYL ALCOHOL 1.4 % OP SOLN: 1.0000 [drp] | Freq: Four times a day (QID) | OPHTHALMIC | Status: DC | PRN

## 2021-09-18 MED ORDER — LORAZEPAM 2 MG/ML PO CONC: 1.0000 mg | ORAL | Status: DC | PRN

## 2021-09-18 MED ORDER — GLYCOPYRROLATE 0.2 MG/ML IJ SOLN: 0.2000 mg | INTRAMUSCULAR | Status: DC | PRN

## 2021-09-18 MED ORDER — LORAZEPAM 2 MG/ML IJ SOLN: 1.0000 mg | INTRAMUSCULAR | Status: DC | PRN

## 2021-09-18 MED ORDER — MORPHINE 100MG IN NS 100ML (1MG/ML) PREMIX INFUSION
1.0000 mg/h | INTRAVENOUS | Status: DC
  Administered 2021-09-18: 1 mg/h via INTRAVENOUS
  Filled 2021-09-18: qty 100

## 2021-09-18 MED ORDER — GLYCOPYRROLATE 1 MG PO TABS: 1.0000 mg | ORAL_TABLET | ORAL | Status: DC | PRN

## 2021-09-18 MED ORDER — MORPHINE BOLUS VIA INFUSION
2.0000 mg | Freq: Once | INTRAVENOUS | Status: AC
  Administered 2021-09-18: 2 mg via INTRAVENOUS
  Filled 2021-09-18: qty 2

## 2021-09-18 MED ORDER — LORAZEPAM 1 MG PO TABS: 1.0000 mg | ORAL_TABLET | ORAL | Status: DC | PRN

## 2021-09-18 NOTE — ED Notes (Signed)
Pt has been changed, linen changed and wound care to rt arm. Pt will respond to answering questions by squeezing hand.

## 2021-09-18 NOTE — Progress Notes (Signed)
° ° °  OVERNIGHT PROGRESS REPORT  Notified by RN for family asking to have some questions addressed in reference to what is involved with [place], and others. Updated the now arrived family members that were travelling from out of state earlier in the presence of the originally present family member.  They express understanding of the current situation and inquired about process here at the Hospital and offered that they also have the DME at home that may accommodate Home Hospice type situation if that is a viable option during the approaching daytime hours/AM rounds.  At the present they wish to continue the current course and are aware that they may /(may need to) make further decisions more immediately toward CMO (comfort measures only) at some point if the patient deteriorates further  They are aware that we are available for any further questions or assistance.      Gershon Cull MSNA MSN ACNPC-AG Acute Care Nurse Practitioner [place]

## 2021-09-18 NOTE — ED Notes (Signed)
Pt unable to eat breakfast at this time. Breakfast tray with family at bedside.

## 2021-09-18 NOTE — ED Notes (Addendum)
Per family request, they were asking to speak to a physician. RN stated that the original admitting doctor left at 7pm. RN offered house coverage physician to come speak with patients family. Patients family agreed. Olena Heckle, APP at bedside. RN explained to patients family that the goal is for the patient to be comfortable. RN offered to get an order for morphine, patients family declined.

## 2021-09-18 NOTE — Progress Notes (Signed)
Chaplain responded to consult to provide support to family.  Chaplain provided emotional and spiritual support and reflective listening to pt's sons, [name] and [name]. Family is most concerned that he be comfortable and are working with staff to adjust medication and position as needed.  Family asked questions about what to expect with the dying process.  Chaplain and nurse responded to questions.  There is additional family coming including [name] daughter, [name]'s wife, and Rick's wife. Please page as needs arise or as family requests support.  Chaplain [name], [place] Pager, [phone] 3:07 PM

## 2021-09-18 NOTE — ED Notes (Signed)
Pt seems to be a little calmer, son feels he is doing ok since last med adjustment, will continue to monitor.

## 2021-09-18 NOTE — ED Notes (Signed)
Patients linens changed. New brief applied. Wound wrapped.

## 2021-09-18 NOTE — ED Notes (Signed)
[name], patients son.

## 2021-09-18 NOTE — Progress Notes (Signed)
Progress Note   Patient: [name] VOH:[id number] DOB: 1935-06-15 DOA: 09/20/2021     1 DOS: the patient was seen and examined on 09/18/2021   Brief hospital course: [name] is an 87 year old male with PMH COPD, PAF, chronic hypoxic respiratory failure, CAD, HTN, HLD, BPH and ongoing failure to thrive.  He has had recurrent hospitalizations notably within the past 1-2 mths when he was treated for pneumonia with COPD exacerbation, a PE, and pneumothorax.  After these recurrent hospitalizations, he has had ongoing failure to thrive with difficulty recovering at home.  He is accompanied by his youngest son in the ER who states that he has remained bedbound for at least the past 1.5 months.  His appetite seems to have remained fairly well with no overt signs of aspiration or difficulty swallowing at home.  He also underwent SLP eval last hospitalization with no significant signs of aspiration and was continued on a regular diet. Today he comes into the hospital with significant respiratory distress; he was found to have a worsening right pleural effusion with loculations overall increased in size compared to imaging from early January.  He underwent right-sided thoracentesis while in the ER removing 1 L fluid with improvement in his respiratory status.  There was interval development of a right subpulmonic pneumothorax after thoracentesis. Goals of care discussions were held in the ER.  Family wished for no treatment for the pneumothorax, continuing DNR status, and continuing supportive measures and some treatment to allow family further time to arrive.   On 09/18/2021 after further family was able to visit with patient, decision was made for transitioning to comfort care.  His breathing pattern had again worsened since admission and he was uncomfortable with ongoing significant hypoxia requiring NRB. He was started a morphine drip for comfort.   Assessment and Plan: * Acute on chronic respiratory  failure with hypoxia (HCC)- (present on admission) - On approximately 6 L O2 at home  -After thoracentesis O2 improved to 8L but overnight he now is on NRB with worsening respiratory status this am - given lack of improvement and ongoing decline, family okay with transitioning to comfort care officially  Pneumothorax on right - PNX in Dec 2022, now again s/p thoracentesis on 2/2 - no treatment planned for PNX; continue supportive measures; family understands and agrees  Goals of care, counseling/discussion - see failure to thrive as well - progressive functional and physical decline over the past several weeks - son has arrived from [place] and is bedside this am, 2/3; update given and questions answered.  - he is no longer stable for discharge from the hospital. At this time we'll transition to inpatient comfort care and start on morphine drip; titrate for comfort - DNR/DNI confirmed - unrestricted visitor status  Failure to thrive in adult - progressive functional decline over the past 2 months at minimum; does eat okay but is no longer ambulatory and his overall physical reserve is extremely poor -Has been followed outpatient with [facility] - as of 2/3 and our morning discussion about GOC, family amenable with transition to full comfort care, especially as he's noted to be more uncomfortable appearing and worsening respiratory status overall - status changed to comfort care; morphine drip initiated for comfort  PAF (paroxysmal atrial fibrillation) (HCC) - de-escalate medications in pursuit of comfort care  Benign prostatic hyperplasia with nocturia- (present on admission) - de-escalate medications in pursuit of comfort care  Hypertension- (present on admission) - low/normal BP for now - can  use labetalol or hydralazine PRN for now   Hyperlipidemia- (present on admission) - de-escalate medications in pursuit of comfort care  CAD (coronary artery disease)- (present on  admission) - de-escalate medications in pursuit of comfort care  COPD mixed type ([place])- (present on admission) - continue O2 - d/c cefepime and solumedrol as transitioning to comfort      Subjective: Son present this morning, he was able to arrive from [place].  I reviewed current status and answer questions.  Patient was appearing uncomfortable in bed with labored breathing.  Family has all spoken overnight and they are okay with transitioning to comfort care at this time expecting end-of-life.  I discussed patient does not look stable enough for discharging from the hospital and we will pursue comfort care inpatient, they were okay with this.  Physical Exam: Vitals:   09/18/21 1000 09/18/21 1100 09/18/21 1215 09/18/21 1435  BP: (!) 135/59 (!) 131/57 (!) 118/55 100/66  Pulse: (!) 116 (!) 110 (!) 109 (!) 118  Resp: (!) 39 (!) 27 (!) 21 17  Temp:      TempSrc:      SpO2: 100% 100% 100% 100%  Weight:      Height:      Physical Exam Constitutional:      Comments: Chronically ill-appearing elderly gentleman lying in bed with irregular respirations appearing more labored now and minimally responsive, nonverbal  HENT:     Head: Normocephalic and atraumatic.     Mouth/Throat:     Mouth: Mucous membranes are dry.  Eyes:     Extraocular Movements: Extraocular movements intact.  Cardiovascular:     Rate and Rhythm: Normal rate and regular rhythm.  Pulmonary:     Comments: B/L coarse sounds but decreased right sided breath sounds and irregular respirations Abdominal:     General: Bowel sounds are normal. There is no distension.     Palpations: Abdomen is soft.  Musculoskeletal:        General: No swelling.     Cervical back: Normal range of motion and neck supple.  Skin:    General: Skin is warm and dry.  Neurological:     Comments: Minimally responsive      Data Reviewed:  I have Reviewed nursing notes, Vitals, and Lab results since pt's last encounter. Pertinent lab results  Creat 0.45 I have reviewed the last note from all staff over past 24 hours,  I have discussed pt's care plan and test results with nursing staff.   Family Communication: son  Disposition: Status is: Inpatient Remains inpatient appropriate because: inpatient comfort care for end of life          Planned Discharge Destination:  remaining inpatient     Author: Dwyane Dee, MD 09/18/2021 3:43 PM  For on call review [email or web address].

## 2021-09-18 NOTE — Progress Notes (Signed)
This RN visited with family and made a pt assessment as noted. Provided family with update on his status and emotional support provided. Pt with agonal respirations and faint heart tones.

## 2021-09-18 NOTE — ED Notes (Signed)
Monitor has been set to comfort care, son is at bedside and has been offered snacks and drinks. Emotional support provided for pt and family.

## 2021-09-19 DIAGNOSIS — J9621 Acute and chronic respiratory failure with hypoxia: Secondary | ICD-10-CM | POA: Diagnosis not present

## 2021-09-19 DIAGNOSIS — J90 Pleural effusion, not elsewhere classified: Principal | ICD-10-CM

## 2021-09-19 DIAGNOSIS — R627 Adult failure to thrive: Secondary | ICD-10-CM | POA: Diagnosis not present

## 2021-09-19 DIAGNOSIS — Z7189 Other specified counseling: Secondary | ICD-10-CM | POA: Diagnosis not present

## 2021-09-19 DIAGNOSIS — J939 Pneumothorax, unspecified: Secondary | ICD-10-CM | POA: Diagnosis not present

## 2021-09-21 ENCOUNTER — Other Ambulatory Visit: Payer: Self-pay | Admitting: *Deleted

## 2021-09-21 LAB — BLOOD CULTURE ID PANEL (REFLEXED) - BCID2

## 2021-09-21 NOTE — Patient Outreach (Signed)
[facility]) Care Management  09/21/2021  [name] 1935-01-21 [id number]   Member readmitted to hospital on 2/2 with respiratory distress, transitioned to comfort care and passed away on 2/4.  Will close case at this time.  [name], RN, MSN, [place] Manager [phone]

## 2021-09-22 ENCOUNTER — Ambulatory Visit: Payer: Medicare Other | Admitting: *Deleted

## 2021-09-22 LAB — CULTURE, BLOOD (ROUTINE X 2): Culture: NO GROWTH

## 2021-09-23 LAB — CULTURE, BLOOD (ROUTINE X 2)

## 2021-09-24 ENCOUNTER — Telehealth: Payer: Self-pay | Admitting: Internal Medicine

## 2021-09-24 NOTE — Assessment & Plan Note (Signed)
Remains O2 dependent Do not expect this to improve. Plan- continue, usually needing 3-4L at rest

## 2021-09-24 NOTE — Assessment & Plan Note (Signed)
Far advanced COPD with increasing need for hospitalization in past year. Plan- continue current meds.

## 2021-09-24 NOTE — Telephone Encounter (Signed)
Condolence card has been mailed to the family

## 2021-10-08 NOTE — Telephone Encounter (Signed)
Pt is now deceased. Will close encounter.

## 2021-10-12 ENCOUNTER — Telehealth: Payer: Medicare Other | Admitting: Internal Medicine

## 2021-10-14 NOTE — Discharge Summary (Signed)
Death Summary  [name] PPI:[id number] DOB: [birth year] DOA: 09-25-21  PCP: Gayland Curry, DO  Admit date: 09-25-21 Date of Death: September 27, 2021 Time of Death: 12:08 am Notification: Family notified of death   History of present illness:  [name] is an 87 year old male with PMH COPD, PAF, chronic hypoxic respiratory failure, CAD, HTN, HLD, BPH and ongoing failure to thrive.  He has had recurrent hospitalizations notably within the past 1-2 mths when he was treated for pneumonia with COPD exacerbation, a PE, and pneumothorax.  After these recurrent hospitalizations, he has had ongoing failure to thrive with difficulty recovering at home.  He is accompanied by his youngest son in the ER who states that he has remained bedbound for at least the past 1.5 months.  His appetite seems to have remained fairly well with no overt signs of aspiration or difficulty swallowing at home.  He also underwent SLP eval last hospitalization with no significant signs of aspiration and was continued on a regular diet. Today he comes into the hospital with significant respiratory distress; he was found to have a worsening right pleural effusion with loculations overall increased in size compared to imaging from early January.  He underwent right-sided thoracentesis while in the ER removing 1 L fluid with improvement in his respiratory status.  There was interval development of a right subpulmonic pneumothorax after thoracentesis. Goals of care discussions were held in the ER.  Family wished for no treatment for the pneumothorax, continuing DNR status, and continuing supportive measures and some treatment to allow family further time to arrive.   On 09/18/2021 after further family was able to visit with patient, decision was made for transitioning to comfort care.  His breathing pattern had again worsened since admission and he was uncomfortable with ongoing significant hypoxia requiring NRB. He was started a  morphine drip for comfort.  He had progressive decline and passed naturally at 12:08 am on 09-27-2021.   Final Diagnoses:  Acute on chronic hypoxic respiratory failure Right loculated pleural effusion Right pneumothorax  Failure to thrive in adult   The results of significant diagnostics from this hospitalization (including imaging, microbiology, ancillary and laboratory) are listed below for reference.    Significant Diagnostic Studies: DG Chest 1 View  Result Date: September 25, 2021 CLINICAL DATA:  87 year old male presenting with sepsis status post right thoracentesis. EXAM: CHEST  1 VIEW COMPARISON:  09/25/2021, 08/21/2021, 08/15/2021 FINDINGS: The heart size and mediastinal contours are within normal limits. Atherosclerotic calcification of the aortic arch. Interval development of small right sub pulmonic pneumothorax status post thoracentesis. Near complete resolution of previously visualized right pleural effusion. Similar appearing scattered right lung opacities, most prominent in the upper lobe with small loculated right upper lobe pleural effusion. The left lung is clear. The visualized skeletal structures are unremarkable. IMPRESSION: Interval development of right subpulmonic pneumothorax status post thoracentesis. Suspected trapped lung. These results were called by telephone at the time of interpretation on 09/25/21 at 4:52 pm to provider Blanchie Dessert, MD, who verbally acknowledged these results. Ruthann Cancer, MD [facility] Radiology Electronically Signed   By: Ruthann Cancer M.D.   On: 09-25-21 16:57   DG Chest Port 1 View  Result Date: 2021/09/25 CLINICAL DATA:  Sepsis EXAM: PORTABLE CHEST 1 VIEW COMPARISON:  Chest x-ray dated August 21, 2021 FINDINGS: Visualized cardiac and mediastinal contours are unchanged. Persistent linear consolidation of the right upper lung. Large loculated right pleural effusion which is increased in size when  compared to prior exam. No evidence of pneumothorax. IMPRESSION: Large loculated right pleural effusion which is increased in size when compared to prior exam. Electronically Signed   By: Yetta Glassman M.D.   On: 09/26/2021 15:32   DG CHEST PORT 1 VIEW  Result Date: 08/21/2021 CLINICAL DATA:  Right-sided chest tube removal. EXAM: PORTABLE CHEST 1 VIEW COMPARISON:  August 20, 2021. FINDINGS: The heart size and mediastinal contours are within normal limits. Left lung is clear. Right-sided chest tube has been removed. No definite pneumothorax is noted. Stable right upper and lower lobe lung opacities are noted with probable small right pleural effusion. The visualized skeletal structures are unremarkable. IMPRESSION: No pneumothorax status post right-sided chest tube removal. Stable right lung findings as described above. Electronically Signed   By: Marijo Conception M.D.   On: 08/21/2021 11:54   US THORACENTESIS ASP PLEURAL SPACE W/IMG GUIDE  Result Date: 09/18/2021 INDICATION: Patient with sepsis and shortness of breath presents today with a right pleural effusion. Interventional radiology asked to perform a therapeutic thoracentesis. EXAM: ULTRASOUND GUIDED THORACENTESIS MEDICATIONS: 1% lidocaine 5 mL COMPLICATIONS: SIR Level A - No therapy, no consequence. PROCEDURE: An ultrasound guided thoracentesis was thoroughly discussed with the patient and questions answered. The benefits, risks, alternatives and complications were also discussed. The patient understands and wishes to proceed with the procedure. Written consent was obtained. Ultrasound was performed to localize and mark an adequate pocket of fluid in the right chest. The area was then prepped and draped in the normal sterile fashion. 1% Lidocaine was used for local anesthesia. Under ultrasound guidance a 6 Fr Safe-T-Centesis catheter was introduced. Thoracentesis was performed. The catheter was removed and a dressing applied. FINDINGS: A total of  approximately 1 L of amber-colored/light red fluid was removed. IMPRESSION: Successful ultrasound guided right thoracentesis yielding 1 L of pleural fluid. Post thoracentesis chest x-ray shows a small right subpulmonic pneumothorax. Electronically Signed   By: Ruthann Cancer M.D.   On: 09/18/2021 07:54    Microbiology: Recent Results (from the past 240 hour(s))  Resp Panel by RT-PCR (Flu A&B, Covid) Nasopharyngeal Swab     Status: None   Collection Time: 10/12/2021  2:17 PM   Specimen: Nasopharyngeal Swab; Nasopharyngeal(NP) swabs in vial transport medium  Result Value Ref Range Status   SARS Coronavirus 2 by RT PCR NEGATIVE NEGATIVE Final    Comment: (NOTE) SARS-CoV-2 target nucleic acids are NOT DETECTED.  The SARS-CoV-2 RNA is generally detectable in upper respiratory specimens during the acute phase of infection. The lowest concentration of SARS-CoV-2 viral copies this assay can detect is 138 copies/mL. A negative result does not preclude SARS-Cov-2 infection and should not be used as the sole basis for treatment or other patient management decisions. A negative result may occur with  improper specimen collection/handling, submission of specimen other than nasopharyngeal swab, presence of viral mutation(s) within the areas targeted by this assay, and inadequate number of viral copies(<138 copies/mL). A negative result must be combined with clinical observations, patient history, and epidemiological information. The expected result is Negative.  Fact Sheet for Patients:  EntrepreneurPulse.com.au  Fact Sheet for Healthcare Providers:  IncredibleEmployment.be  This test is no t yet approved or cleared by the [place] FDA and  has been authorized for detection and/or diagnosis of SARS-CoV-2 by FDA under an Emergency Use Authorization (EUA). This EUA will remain  in effect (meaning this test can be used) for the duration of the COVID-19  declaration under Section 564(b)(1) of  the Act, 21 U.S.C.section 360bbb-3(b)(1), unless the authorization is terminated  or revoked sooner.       Influenza A by PCR NEGATIVE NEGATIVE Final   Influenza B by PCR NEGATIVE NEGATIVE Final    Comment: (NOTE) The Xpert Xpress SARS-CoV-2/FLU/RSV plus assay is intended as an aid in the diagnosis of influenza from Nasopharyngeal swab specimens and should not be used as a sole basis for treatment. Nasal washings and aspirates are unacceptable for Xpert Xpress SARS-CoV-2/FLU/RSV testing.  Fact Sheet for Patients: EntrepreneurPulse.com.au  Fact Sheet for Healthcare Providers: IncredibleEmployment.be  This test is not yet approved or cleared by the [place] FDA and has been authorized for detection and/or diagnosis of SARS-CoV-2 by FDA under an Emergency Use Authorization (EUA). This EUA will remain in effect (meaning this test can be used) for the duration of the COVID-19 declaration under Section 564(b)(1) of the Act, 21 U.S.C. section 360bbb-3(b)(1), unless the authorization is terminated or revoked.  Performed at [facility], [place] [address], [place], [place] [address]   Blood Culture (routine x 2)     Status: None (Preliminary result)   Collection Time: 09/23/2021  2:17 PM   Specimen: BLOOD  Result Value Ref Range Status   Specimen Description   Final    BLOOD RIGHT ANTECUBITAL Performed at [place] [address], [place], [place] [address]    Special Requests   Final    BOTTLES DRAWN AEROBIC AND ANAEROBIC Blood Culture results may not be optimal due to an inadequate volume of blood received in culture bottles Performed at [place] [address] [place], [place] [address]    Culture   Final    NO GROWTH 2 DAYS Performed at [facility] [address] [place], [place] [address]    Report Status PENDING  Incomplete   Blood Culture (routine x 2)     Status: None (Preliminary result)   Collection Time: 09/21/2021  2:17 PM   Specimen: BLOOD  Result Value Ref Range Status   Specimen Description   Final    BLOOD LEFT ANTECUBITAL Performed at [place] [address] [place], [place] [address]    Special Requests   Final    BOTTLES DRAWN AEROBIC AND ANAEROBIC Blood Culture results may not be optimal due to an inadequate volume of blood received in culture bottles Performed at [place] [address], [place], [place] [address]    Culture   Final    NO GROWTH 2 DAYS Performed at [place] [address] [place], [place] [address]    Report Status PENDING  Incomplete     Labs: Basic Metabolic Panel: Recent Labs  Lab 09/28/2021 1417  NA 143  K 4.8  CL 93*  CO2 42*  GLUCOSE 117*  BUN 28*  CREATININE 0.45*  CALCIUM 9.5   Liver Function Tests: Recent Labs  Lab 09/24/2021 1417  AST 14*  ALT 17  ALKPHOS 56  BILITOT 0.2*  PROT 6.6  ALBUMIN 2.8*   No results for input(s): LIPASE, AMYLASE in the last 168 hours. No results for input(s): AMMONIA in the last 168 hours. CBC: Recent Labs  Lab 09/18/2021 1417  WBC 11.3*  NEUTROABS 10.1*  HGB 9.9*  HCT 35.6*  MCV 96.7  PLT 420*   Cardiac Enzymes: No results for input(s): CKTOTAL, CKMB, CKMBINDEX, TROPONINI in the last 168 hours. D-Dimer No results for input(s): DDIMER in the  last 72 hours. BNP: Invalid input(s): POCBNP CBG: No results for input(s): GLUCAP in the last 168 hours. Anemia work up No results for input(s): VITAMINB12, FOLATE, FERRITIN, TIBC, IRON, RETICCTPCT in the last 72 hours. Urinalysis    Component Value Date/Time   COLORURINE YELLOW 09/18/2021 1200   APPEARANCEUR HAZY (A) 09/18/2021 1200   LABSPEC 1.030 09/18/2021 1200   PHURINE 6.0 09/18/2021 1200   GLUCOSEU NEGATIVE 09/18/2021 1200   HGBUR MODERATE (A) 09/18/2021 1200   BILIRUBINUR NEGATIVE 09/18/2021 1200    KETONESUR NEGATIVE 09/18/2021 1200   PROTEINUR NEGATIVE 09/18/2021 1200   NITRITE NEGATIVE 09/18/2021 1200   LEUKOCYTESUR NEGATIVE 09/18/2021 1200   Sepsis Labs Invalid input(s): PROCALCITONIN,  WBC,  LACTICIDVEN   SIGNED:  Dwyane Dee, MD  [facility] 28-Sep-2021, 6:00 PM

## 2021-10-14 NOTE — Progress Notes (Signed)
Dr Linda Hedges  oncall for [facility] returned call re Death notification

## 2021-10-14 NOTE — Progress Notes (Signed)
Pt has expired at [facility] - no respirations , no heart tones noted. Pts son and his wife at the bedside as well as the pts spouse.

## 2021-10-14 DEATH — deceased

## 2021-12-02 ENCOUNTER — Ambulatory Visit: Payer: Medicare Other | Admitting: Cardiovascular Disease

## 2022-01-19 ENCOUNTER — Ambulatory Visit: Payer: Medicare Other | Admitting: Internal Medicine
